# Patient Record
Sex: Male | Born: 1965 | Hispanic: Yes | Marital: Married | Smoking: Never smoker
Health system: Southern US, Community
[De-identification: ages and names within clinical notes are randomized; demographics above are authoritative.]

## PROBLEM LIST (undated history)

## (undated) DIAGNOSIS — E7800 Pure hypercholesterolemia, unspecified: Secondary | ICD-10-CM

## (undated) DIAGNOSIS — I10 Essential (primary) hypertension: Secondary | ICD-10-CM

## (undated) HISTORY — PX: HERNIA REPAIR: SHX51

---

## 2020-01-26 ENCOUNTER — Emergency Department (HOSPITAL_COMMUNITY)
Admission: EM | Admit: 2020-01-26 | Discharge: 2020-01-26 | Disposition: A | Discharge status: Home / Self Care | Payer: Self-pay | Attending: Emergency Medicine | Admitting: Emergency Medicine

## 2020-01-26 ENCOUNTER — Encounter (HOSPITAL_COMMUNITY): Payer: Self-pay

## 2020-01-26 ENCOUNTER — Emergency Department (HOSPITAL_COMMUNITY): Payer: Self-pay

## 2020-01-26 DIAGNOSIS — W19XXXA Unspecified fall, initial encounter: Secondary | ICD-10-CM

## 2020-01-26 DIAGNOSIS — M5441 Lumbago with sciatica, right side: Secondary | ICD-10-CM | POA: Insufficient documentation

## 2020-01-26 LAB — URINALYSIS, ROUTINE W REFLEX MICROSCOPIC
Bilirubin Urine: NEGATIVE
Glucose, UA: NEGATIVE mg/dL
Hgb urine dipstick: NEGATIVE
Ketones, ur: NEGATIVE mg/dL
Leukocytes,Ua: NEGATIVE
Nitrite: NEGATIVE
Protein, ur: NEGATIVE mg/dL
Specific Gravity, Urine: 1.01 (ref 1.005–1.030)
pH: 6 (ref 5.0–8.0)

## 2020-01-26 MED ORDER — LIDOCAINE 5 % EX PTCH: 1.0000 | MEDICATED_PATCH | CUTANEOUS | 0 refills | Status: DC

## 2020-01-26 MED ORDER — KETOROLAC TROMETHAMINE 30 MG/ML IJ SOLN
30.0000 mg | Freq: Once | INTRAMUSCULAR | Status: AC
  Administered 2020-01-26: 30 mg via INTRAMUSCULAR
  Filled 2020-01-26: qty 1

## 2020-01-26 MED ORDER — HYDROCODONE-ACETAMINOPHEN 5-325 MG PO TABS: 1.0000 | ORAL_TABLET | ORAL | 0 refills | Status: DC | PRN

## 2020-01-26 MED ORDER — PREDNISONE 50 MG PO TABS
50.0000 mg | ORAL_TABLET | Freq: Every day | ORAL | 0 refills | Status: AC
Start: 2020-01-26 — End: 2020-01-31

## 2020-01-26 NOTE — ED Provider Notes (Signed)
[facility] Provider Note   CSN: [id number] Arrival date & time: 01/26/20  0932    History Chief Complaint  Patient presents with  . Back Pain    Trevor Shelton is a 54 y.o. male with no significant past medical history who presents for evaluation after fall.  Patient states 2-3 weeks ago he was approximately 4 feet off the ground when he fell.  Patient states he hit the right aspect of his body.  Since then he has had right lateral chest pain, right lumbar pain.  Pain occasionally radiate into her right gluteus. Was given Flexeril and naproxen by urgent care.  Patient states he continues to have symptoms.  Pain worse with palpating the ribs.  He denies any his head, LOC or anticoagulation.  He denies any chest pain, shortness of breath, hemoptysis, lateral leg swelling, redness, warmth, headache, dizziness, abdominal pain, diarrhea, dysuria.  Denies additional aggravating or alleviating factors.  History obtained from patient and past medical records.  Patient declined medical spanish interpreter and preferred to use daughter.  HPI     History reviewed. No pertinent past medical history.  There are no problems to display for this patient.   History reviewed. No pertinent surgical history.     No family history on file.  Social History   Tobacco Use  . Smoking status: Not on file  Substance Use Topics  . Alcohol use: Not on file  . Drug use: Not on file    Home Medications Prior to Admission medications   Medication Sig Start Date End Date Taking? Authorizing Provider  HYDROcodone-acetaminophen (NORCO/VICODIN) 5-325 MG tablet Take 1 tablet by mouth every 4 (four) hours as needed. 01/26/20   Orlando Thalmann A, PA-C  lidocaine (LIDODERM) 5 % Place 1 patch onto the skin daily. Remove & Discard patch within 12 hours or as directed by MD 01/26/20   Ahman Dugdale A, PA-C  predniSONE (DELTASONE) 50 MG tablet Take 1 tablet (50 mg total) by  mouth daily for 5 days. 01/26/20 01/31/20  Derric Dealmeida A, PA-C    Allergies    Patient has no allergy information on record.  Review of Systems   Review of Systems  Constitutional: Negative.   HENT: Negative.   Respiratory: Negative.   Cardiovascular: Negative.   Gastrointestinal: Negative.   Genitourinary: Negative.   Musculoskeletal: Positive for back pain. Negative for arthralgias, gait problem, joint swelling, myalgias, neck pain and neck stiffness.  Skin: Negative.   Neurological: Negative.   All other systems reviewed and are negative.   Physical Exam Updated Vital Signs BP (!) 150/83 (BP Location: Left Arm)   Pulse 66   Temp 97.7 F (36.5 C) (Oral)   Resp 12   SpO2 100%   Physical Exam Physical Exam  Constitutional: Pt is oriented to person, place, and time. Appears well-developed and well-nourished. No distress.  HENT:  Head: Normocephalic and atraumatic.  Nose: Nose normal.  Mouth/Throat: Uvula is midline, oropharynx is clear and moist and mucous membranes are normal.  Eyes: Conjunctivae and EOM are normal. Pupils are equal, round, and reactive to light.  Neck: No spinous process tenderness and no muscular tenderness present. No rigidity. Normal range of motion present.  Full ROM without pain No midline cervical tenderness No crepitus, deformity or step-offs No paraspinal tenderness  Cardiovascular: Normal rate, regular rhythm and intact distal pulses.   Pulses:      Radial pulses are 2+ on the right side, and 2+ on  the left side.       Dorsalis pedis pulses are 2+ on the right side, and 2+ on the left side.       Posterior tibial pulses are 2+ on the right side, and 2+ on the left side.  Pulmonary/Chest: Effort normal and breath sounds normal. No accessory muscle usage. No respiratory distress. No decreased breath sounds. No wheezes. No rhonchi. No rales. Exhibits no tenderness and no bony tenderness.  No flail segment, crepitus or deformity Equal chest  expansion  Tenderness palpation to lateral, anterior lower right ribs. Abdominal: Soft. Normal appearance and bowel sounds are normal. There is no tenderness. There is no rigidity, no guarding and no CVA tenderness.  Abd soft and nontender  Musculoskeletal: Normal range of motion.       Thoracic back: Exhibits normal range of motion.       Lumbar back: Exhibits normal range of motion.  Full range of motion of the T-spine and L-spine No tenderness to palpation of the spinous processes of the T-spine or L-spine No crepitus, deformity or step-offs Moderate tenderness to palpation of the paraspinous muscles of the L-spine on the RIGHT more so over SI, piriformis. Positive straight leg raise on right at 45" Lymphadenopathy:    Pt has no cervical adenopathy.  Neurological: Pt is alert and oriented to person, place, and time. Normal reflexes. No cranial nerve deficit. GCS eye subscore is 4. GCS verbal subscore is 5. GCS motor subscore is 6.  Reflex Scores:      Bicep reflexes are 2+ on the right side and 2+ on the left side.      Brachioradialis reflexes are 2+ on the right side and 2+ on the left side.      Patellar reflexes are 2+ on the right side and 2+ on the left side.      Achilles reflexes are 2+ on the right side and 2+ on the left side. Speech is clear and goal oriented, follows commands Normal 5/5 strength in upper and lower extremities bilaterally including dorsiflexion and plantar flexion, strong and equal grip strength Sensation normal to light and sharp touch Moves extremities without ataxia, coordination intact Normal gait and balance No Clonus  Skin: Skin is warm and dry. No rash noted. Pt is not diaphoretic. No erythema.  Psychiatric: Normal mood and affect.  Nursing note and vitals reviewed. ED Results / Procedures / Treatments   Labs (all labs ordered are listed, but only abnormal results are displayed) Labs Reviewed  URINALYSIS, ROUTINE W REFLEX MICROSCOPIC     EKG None  Radiology DG Ribs Bilateral W/Chest  Result Date: 01/26/2020 CLINICAL DATA:  Right rib pain after fall at work. EXAM: BILATERAL RIBS AND CHEST - 4+ VIEW COMPARISON:  None. FINDINGS: No fracture or other bone lesions are seen involving the ribs. There is no evidence of pneumothorax or pleural effusion. Both lungs are clear. Heart size and mediastinal contours are within normal limits. IMPRESSION: Negative. Electronically Signed   By: Marijo Conception M.D.   On: 01/26/2020 13:20   DG Thoracic Spine 2 View  Result Date: 01/26/2020 CLINICAL DATA:  Back pain after fall at work. EXAM: THORACIC SPINE 2 VIEWS COMPARISON:  None. FINDINGS: There is no evidence of thoracic spine fracture. Alignment is normal. No other significant bone abnormalities are identified. IMPRESSION: Negative. Electronically Signed   By: Marijo Conception M.D.   On: 01/26/2020 13:21   DG Lumbar Spine Complete  Result Date: 01/26/2020 CLINICAL DATA:  Fall, back pain. Additional provided: Fall 3 weeks ago, sharp pain in posterior back on right side. EXAM: LUMBAR SPINE - COMPLETE 4+ VIEW COMPARISON:  No pertinent prior studies available for comparison. FINDINGS: For the purposes of this dictation, five lumbar vertebrae are assumed and the caudal most well-formed intervertebral disc is designated L5-S1. Rudimentary ribs at the T12 level. Mild L1-L2, L2-L3, L3-L4, L4-L5 grade 1 retrolisthesis. No lumbar compression deformity is demonstrated. Intervertebral disc height is maintained. Mild facet arthrosis greatest at L5-S1. Suspected fusion across the sacroiliac joints bilaterally IMPRESSION: No lumbar compression deformity is demonstrated. Mild L1-L2, L2-L3, L3-L4, L4-L5 grade 1 retrolisthesis. Mild lumbar spondylosis as outlined. Suspected fusion across the sacroiliac joints bilaterally. Electronically Signed   By: Kellie Simmering DO   On: 01/26/2020 10:15   Procedures Procedures (including critical care time)  Medications  Ordered in ED Medications  ketorolac (TORADOL) 30 MG/ML injection 30 mg (30 mg Intramuscular Given 01/26/20 1339)    ED Course  I have reviewed the triage vital signs and the nursing notes.  Pertinent labs & imaging results that were available during my care of the patient were reviewed by me and considered in my medical decision making (see chart for details).  54 year old presents for evaluation of right-sided lower back pain as well as some right rib pain after falling 2 weeks ago.  Afebrile, nonseptic, not ill-appearing. Able to reproduce pain. Tenderness to right lateral ribs however symptoms not consistent with ACS, PE, dissection. Low suspicion for acute traumatic injury given has been 3 weeks. No overlying ecchymosis to suggest internal bleed.  Heart and lung clear.  Abdomen soft, nontender.  Tenderness over right piriformis, SI.  Positive straight leg raise.  No history of IV drug use, bowel or bladder incontinence, saddle paresthesias.  He is ambulatory that difficulty.  Neurovascularly intact.  Pelvis stable, nontender palpation.  Plan on imaging and reassess  Imaging personally reviewed and interpreted.  No acute findings. Urinalysis negative for infection.  Patient reassessed.  He is ambulatory without difficulty.  Will do short course of pain medication, lidocaine patches, NSAIDs and have him follow-up with orthopedics.  Low suspicion for acute emergent pathology such as infection, hemorrhage, cauda equina, discitis, osteomyelitis, transverse myelitis, ACS, PE, dissection.  The patient has been appropriately medically screened and/or stabilized in the ED. I have low suspicion for any other emergent medical condition which would require further screening, evaluation or treatment in the ED or require inpatient management.  Patient is hemodynamically stable and in no acute distress.  Patient able to ambulate in department prior to ED.  Evaluation does not show acute pathology that would  require ongoing or additional emergent interventions while in the emergency department or further inpatient treatment.  I have discussed the diagnosis with the patient and answered all questions.  Pain is been managed while in the emergency department and patient has no further complaints prior to discharge.  Patient is comfortable with plan discussed in room and is stable for discharge at this time.  I have discussed strict return precautions for returning to the emergency department.  Patient was encouraged to follow-up with PCP/specialist refer to at discharge.    MDM Rules/Calculators/A&P                           Final Clinical Impression(s) / ED Diagnoses Final diagnoses:  Fall  Acute right-sided low back pain with right-sided sciatica    Rx / DC Orders ED  Discharge Orders         Ordered    HYDROcodone-acetaminophen (NORCO/VICODIN) 5-325 MG tablet  Every 4 hours PRN     Discontinue  Reprint     01/26/20 1406    lidocaine (LIDODERM) 5 %  Every 24 hours     Discontinue  Reprint     01/26/20 1406    predniSONE (DELTASONE) 50 MG tablet  Daily     Discontinue  Reprint     01/26/20 1406           Nicholas Trompeter A, PA-C 01/26/20 1410    Dorie Rank, MD 01/26/20 1536

## 2020-01-26 NOTE — ED Notes (Signed)
Patient Alert and oriented to baseline. Stable and ambulatory to baseline. Patient verbalized understanding of the discharge instructions.  Patient belongings were taken by the patient.   

## 2020-01-26 NOTE — ED Triage Notes (Signed)
Pt reports right sided lower back pain for the past 2 week, seen at [facility] and given muscle relaxer without relief. Pt reports a fall at work prior to the pain. Pt ambulatory

## 2020-01-26 NOTE — Discharge Instructions (Signed)
Follow up with Orthopedics  Return for new or worsening symptoms

## 2020-02-07 DIAGNOSIS — N179 Acute kidney failure, unspecified: Secondary | ICD-10-CM

## 2020-02-07 HISTORY — DX: Acute kidney failure, unspecified: N17.9

## 2020-02-07 HISTORY — DX: Hypercalcemia: E83.52

## 2020-02-14 ENCOUNTER — Encounter (HOSPITAL_COMMUNITY): Payer: Self-pay | Admitting: Emergency Medicine

## 2020-02-14 ENCOUNTER — Emergency Department (HOSPITAL_COMMUNITY): Payer: Self-pay

## 2020-02-14 ENCOUNTER — Inpatient Hospital Stay (HOSPITAL_COMMUNITY)
Admission: EM | Admit: 2020-02-14 | Discharge: 2020-03-03 | DRG: 840 | Disposition: A | Discharge status: Rehabilitation Facility | Payer: Self-pay | Attending: Internal Medicine | Admitting: Internal Medicine

## 2020-02-14 ENCOUNTER — Other Ambulatory Visit: Payer: Self-pay

## 2020-02-14 DIAGNOSIS — E785 Hyperlipidemia, unspecified: Secondary | ICD-10-CM | POA: Diagnosis present

## 2020-02-14 DIAGNOSIS — K219 Gastro-esophageal reflux disease without esophagitis: Secondary | ICD-10-CM | POA: Diagnosis present

## 2020-02-14 DIAGNOSIS — D631 Anemia in chronic kidney disease: Secondary | ICD-10-CM | POA: Diagnosis present

## 2020-02-14 DIAGNOSIS — G9529 Other cord compression: Secondary | ICD-10-CM | POA: Diagnosis present

## 2020-02-14 DIAGNOSIS — Z452 Encounter for adjustment and management of vascular access device: Secondary | ICD-10-CM

## 2020-02-14 DIAGNOSIS — E8809 Other disorders of plasma-protein metabolism, not elsewhere classified: Secondary | ICD-10-CM | POA: Diagnosis present

## 2020-02-14 DIAGNOSIS — R945 Abnormal results of liver function studies: Secondary | ICD-10-CM | POA: Diagnosis present

## 2020-02-14 DIAGNOSIS — S22079A Unspecified fracture of T9-T10 vertebra, initial encounter for closed fracture: Secondary | ICD-10-CM

## 2020-02-14 DIAGNOSIS — T380X5A Adverse effect of glucocorticoids and synthetic analogues, initial encounter: Secondary | ICD-10-CM | POA: Diagnosis not present

## 2020-02-14 DIAGNOSIS — S22059A Unspecified fracture of T5-T6 vertebra, initial encounter for closed fracture: Secondary | ICD-10-CM

## 2020-02-14 DIAGNOSIS — C9000 Multiple myeloma not having achieved remission: Principal | ICD-10-CM

## 2020-02-14 DIAGNOSIS — K5900 Constipation, unspecified: Secondary | ICD-10-CM | POA: Diagnosis present

## 2020-02-14 DIAGNOSIS — K1379 Other lesions of oral mucosa: Secondary | ICD-10-CM | POA: Diagnosis not present

## 2020-02-14 DIAGNOSIS — S2242XA Multiple fractures of ribs, left side, initial encounter for closed fracture: Secondary | ICD-10-CM

## 2020-02-14 DIAGNOSIS — W11XXXD Fall on and from ladder, subsequent encounter: Secondary | ICD-10-CM | POA: Diagnosis present

## 2020-02-14 DIAGNOSIS — Z6832 Body mass index (BMI) 32.0-32.9, adult: Secondary | ICD-10-CM

## 2020-02-14 DIAGNOSIS — Z20822 Contact with and (suspected) exposure to covid-19: Secondary | ICD-10-CM | POA: Diagnosis present

## 2020-02-14 DIAGNOSIS — E871 Hypo-osmolality and hyponatremia: Secondary | ICD-10-CM | POA: Diagnosis present

## 2020-02-14 DIAGNOSIS — E872 Acidosis: Secondary | ICD-10-CM | POA: Diagnosis not present

## 2020-02-14 DIAGNOSIS — E1165 Type 2 diabetes mellitus with hyperglycemia: Secondary | ICD-10-CM | POA: Diagnosis not present

## 2020-02-14 DIAGNOSIS — G9589 Other specified diseases of spinal cord: Secondary | ICD-10-CM | POA: Diagnosis present

## 2020-02-14 DIAGNOSIS — M8448XA Pathological fracture, other site, initial encounter for fracture: Secondary | ICD-10-CM | POA: Diagnosis present

## 2020-02-14 DIAGNOSIS — R042 Hemoptysis: Secondary | ICD-10-CM

## 2020-02-14 DIAGNOSIS — M8440XA Pathological fracture, unspecified site, initial encounter for fracture: Secondary | ICD-10-CM | POA: Diagnosis present

## 2020-02-14 DIAGNOSIS — E119 Type 2 diabetes mellitus without complications: Secondary | ICD-10-CM

## 2020-02-14 DIAGNOSIS — N179 Acute kidney failure, unspecified: Secondary | ICD-10-CM

## 2020-02-14 DIAGNOSIS — E669 Obesity, unspecified: Secondary | ICD-10-CM | POA: Diagnosis present

## 2020-02-14 DIAGNOSIS — N170 Acute kidney failure with tubular necrosis: Secondary | ICD-10-CM | POA: Diagnosis present

## 2020-02-14 DIAGNOSIS — Z79899 Other long term (current) drug therapy: Secondary | ICD-10-CM

## 2020-02-14 DIAGNOSIS — R748 Abnormal levels of other serum enzymes: Secondary | ICD-10-CM

## 2020-02-14 DIAGNOSIS — Z713 Dietary counseling and surveillance: Secondary | ICD-10-CM

## 2020-02-14 DIAGNOSIS — D492 Neoplasm of unspecified behavior of bone, soft tissue, and skin: Secondary | ICD-10-CM

## 2020-02-14 DIAGNOSIS — J9811 Atelectasis: Secondary | ICD-10-CM | POA: Diagnosis not present

## 2020-02-14 DIAGNOSIS — R197 Diarrhea, unspecified: Secondary | ICD-10-CM | POA: Diagnosis not present

## 2020-02-14 DIAGNOSIS — E875 Hyperkalemia: Secondary | ICD-10-CM | POA: Diagnosis present

## 2020-02-14 DIAGNOSIS — D72829 Elevated white blood cell count, unspecified: Secondary | ICD-10-CM | POA: Diagnosis present

## 2020-02-14 DIAGNOSIS — I10 Essential (primary) hypertension: Secondary | ICD-10-CM | POA: Diagnosis present

## 2020-02-14 HISTORY — DX: Pure hypercholesterolemia, unspecified: E78.00

## 2020-02-14 HISTORY — DX: Essential (primary) hypertension: I10

## 2020-02-14 LAB — URINALYSIS, ROUTINE W REFLEX MICROSCOPIC
Bilirubin Urine: NEGATIVE
Glucose, UA: NEGATIVE mg/dL
Hgb urine dipstick: NEGATIVE
Ketones, ur: NEGATIVE mg/dL
Leukocytes,Ua: NEGATIVE
Nitrite: NEGATIVE
Protein, ur: NEGATIVE mg/dL
Specific Gravity, Urine: 1.012 (ref 1.005–1.030)
pH: 6 (ref 5.0–8.0)

## 2020-02-14 LAB — CBC
HCT: 41.2 % (ref 39.0–52.0)
Hemoglobin: 14.2 g/dL (ref 13.0–17.0)
MCH: 29.3 pg (ref 26.0–34.0)
MCHC: 34.5 g/dL (ref 30.0–36.0)
MCV: 85.1 fL (ref 80.0–100.0)
Platelets: 212 10*3/uL (ref 150–400)
RBC: 4.84 MIL/uL (ref 4.22–5.81)
RDW: 11.2 % — ABNORMAL LOW (ref 11.5–15.5)
WBC: 13.3 10*3/uL — ABNORMAL HIGH (ref 4.0–10.5)
nRBC: 0 % (ref 0.0–0.2)

## 2020-02-14 LAB — COMPREHENSIVE METABOLIC PANEL
ALT: 42 U/L (ref 0–44)
AST: 34 U/L (ref 15–41)
Albumin: 3.3 g/dL — ABNORMAL LOW (ref 3.5–5.0)
Alkaline Phosphatase: 66 U/L (ref 38–126)
Anion gap: 14 (ref 5–15)
BUN: 84 mg/dL — ABNORMAL HIGH (ref 6–20)
CO2: 29 mmol/L (ref 22–32)
Calcium: >15 mg/dL (ref 8.9–10.3)
Chloride: 82 mmol/L — ABNORMAL LOW (ref 98–111)
Creatinine, Ser: 4.73 mg/dL — ABNORMAL HIGH (ref 0.61–1.24)
GFR calc Af Amer: 15 mL/min — ABNORMAL LOW (ref >60)
GFR calc non Af Amer: 13 mL/min — ABNORMAL LOW (ref >60)
Glucose, Bld: 158 mg/dL — ABNORMAL HIGH (ref 70–99)
Potassium: 4.4 mmol/L (ref 3.5–5.1)
Sodium: 125 mmol/L — ABNORMAL LOW (ref 135–145)
Total Bilirubin: 1.3 mg/dL — ABNORMAL HIGH (ref 0.3–1.2)
Total Protein: 6.8 g/dL (ref 6.5–8.1)

## 2020-02-14 LAB — LIPASE, BLOOD: Lipase: 28 U/L (ref 11–51)

## 2020-02-14 LAB — TSH: TSH: 0.508 u[IU]/mL (ref 0.350–4.500)

## 2020-02-14 LAB — SARS CORONAVIRUS 2 BY RT PCR (HOSPITAL ORDER, PERFORMED IN ~~LOC~~ HOSPITAL LAB): SARS Coronavirus 2: NEGATIVE

## 2020-02-14 MED ORDER — DOCUSATE SODIUM 100 MG PO CAPS
100.0000 mg | ORAL_CAPSULE | Freq: Every day | ORAL | Status: DC
  Administered 2020-02-15 – 2020-02-18 (×4): 100 mg via ORAL
  Filled 2020-02-14 (×5): qty 1

## 2020-02-14 MED ORDER — HEPARIN SODIUM (PORCINE) 5000 UNIT/ML IJ SOLN
5000.0000 [IU] | Freq: Three times a day (TID) | INTRAMUSCULAR | Status: DC
  Administered 2020-02-15 – 2020-02-17 (×7): 5000 [IU] via SUBCUTANEOUS
  Filled 2020-02-14 (×7): qty 1

## 2020-02-14 MED ORDER — SODIUM CHLORIDE 0.9 % IV SOLN: Freq: Once | INTRAVENOUS | Status: AC

## 2020-02-14 MED ORDER — ONDANSETRON HCL 4 MG PO TABS
4.0000 mg | ORAL_TABLET | Freq: Four times a day (QID) | ORAL | Status: DC | PRN
  Administered 2020-02-15: 4 mg via ORAL
  Filled 2020-02-14 (×2): qty 1

## 2020-02-14 MED ORDER — HYDROMORPHONE HCL 1 MG/ML IJ SOLN
0.5000 mg | INTRAMUSCULAR | Status: DC | PRN
  Administered 2020-02-14 – 2020-02-17 (×16): 1 mg via INTRAVENOUS
  Administered 2020-02-17: 0.5 mg via INTRAVENOUS
  Administered 2020-02-17 – 2020-03-02 (×36): 1 mg via INTRAVENOUS
  Filled 2020-02-14 (×53): qty 1

## 2020-02-14 MED ORDER — ACETAMINOPHEN 325 MG PO TABS
650.0000 mg | ORAL_TABLET | Freq: Four times a day (QID) | ORAL | Status: DC | PRN
  Administered 2020-02-22 – 2020-03-02 (×9): 650 mg via ORAL
  Filled 2020-02-14 (×9): qty 2

## 2020-02-14 MED ORDER — ONDANSETRON HCL 4 MG/2ML IJ SOLN
4.0000 mg | Freq: Four times a day (QID) | INTRAMUSCULAR | Status: DC | PRN
  Administered 2020-02-17: 4 mg via INTRAVENOUS
  Filled 2020-02-14 (×2): qty 2

## 2020-02-14 MED ORDER — ACETAMINOPHEN 650 MG RE SUPP: 650.0000 mg | Freq: Four times a day (QID) | RECTAL | Status: DC | PRN

## 2020-02-14 MED ORDER — SODIUM CHLORIDE 0.9% FLUSH: 3.0000 mL | Freq: Once | INTRAVENOUS | Status: DC

## 2020-02-14 MED ORDER — HYDROMORPHONE HCL 1 MG/ML IJ SOLN
0.5000 mg | Freq: Once | INTRAMUSCULAR | Status: AC
  Administered 2020-02-14: 0.5 mg via INTRAVENOUS
  Filled 2020-02-14: qty 1

## 2020-02-14 MED ORDER — SODIUM CHLORIDE 0.9 % BOLUS PEDS: 1000.0000 mL | Freq: Once | INTRAVENOUS | Status: DC

## 2020-02-14 MED ORDER — SODIUM CHLORIDE 0.9 % IV BOLUS
1000.0000 mL | Freq: Once | INTRAVENOUS | Status: AC
  Administered 2020-02-14: 1000 mL via INTRAVENOUS

## 2020-02-14 MED ORDER — POLYETHYLENE GLYCOL 3350 17 G PO PACK
17.0000 g | PACK | Freq: Every day | ORAL | Status: DC
  Administered 2020-02-15 – 2020-02-16 (×2): 17 g via ORAL
  Filled 2020-02-14 (×3): qty 1

## 2020-02-14 MED ORDER — OXYCODONE HCL 5 MG PO TABS
5.0000 mg | ORAL_TABLET | ORAL | Status: DC | PRN
  Administered 2020-02-15 – 2020-03-03 (×32): 5 mg via ORAL
  Filled 2020-02-14 (×33): qty 1

## 2020-02-14 MED ORDER — GADOBUTROL 1 MMOL/ML IV SOLN
6.0000 mL | Freq: Once | INTRAVENOUS | Status: AC | PRN
  Administered 2020-02-14: 6 mL via INTRAVENOUS

## 2020-02-14 MED ORDER — ENOXAPARIN SODIUM 40 MG/0.4ML ~~LOC~~ SOLN: 40.0000 mg | SUBCUTANEOUS | Status: DC

## 2020-02-14 MED ORDER — SODIUM CHLORIDE 0.9 % IV SOLN: INTRAVENOUS | Status: DC

## 2020-02-14 NOTE — ED Notes (Signed)
Pt resting at this time.

## 2020-02-14 NOTE — ED Triage Notes (Signed)
Pt fell 1 month ago.  Reports generalized weakness, generalized abd pain, lower back pain, vomiting, and diarrhea since the fall.

## 2020-02-14 NOTE — ED Provider Notes (Signed)
[facility] Provider Note   CSN: [id number] Arrival date & time: 02/14/20  1211     History Chief Complaint  Patient presents with  . Weakness  . Abdominal Pain    Trevor Shelton is a 54 y.o. male presents for evaluation of acute onset, progressively worsening chest pain and abdominal pain for 1 month, generalized weakness for 2 weeks.  He reports that in early July he fell off of a ladder from a height of around 4 feet, landed on his left side.  Denies head injury or loss of consciousness.  He was seen on 01/26/2020 for pain with negative radiographs.  2 weeks ago he went to see an orthopedist outpatient and states that he administered "2 injections to my buttocks" and prescribed him a medication but he does not know what it is.  He states it has not been helpful.  He notes daily 9/10 burning pain around his chest radiating to the back and into the abdomen.  He does feel worsening pain with deep inspiration and feels as though he cannot get a deep breath.  No nausea or vomiting.  Has been constipated and has noted decreased urine output and weak urine stream for the last 2 weeks or so.  He is also noted a feeling of weakness to the bilateral lower extremities for the last 2 weeks.  States that he feels unsteady with ambulation and has required additional assistance from family members at home.  Denies bowel or bladder incontinence.  With prolonged ambulation he notes cramping sensation in his lower extremities bilaterally.  He takes fish oil supplements for cholesterol and a blood pressure medication but is unsure of what it is.  The history is provided by the patient.       History reviewed. No pertinent past medical history.  Patient Active Problem List   Diagnosis Date Noted  . Hypercalcemia 02/14/2020  . AKI (acute kidney injury) ([place]) 02/14/2020  . Pathologic fracture 02/14/2020  . Hyponatremia 02/14/2020  . Leukocytosis 02/14/2020    History  reviewed. No pertinent surgical history.     History reviewed. No pertinent family history.  Social History   Tobacco Use  . Smoking status: Never Smoker  . Smokeless tobacco: Never Used  Substance Use Topics  . Alcohol use: Not Currently  . Drug use: Not Currently    Home Medications Prior to Admission medications   Medication Sig Start Date End Date Taking? Authorizing Provider  HYDROcodone-acetaminophen (NORCO/VICODIN) 5-325 MG tablet Take 1 tablet by mouth every 4 (four) hours as needed. 01/26/20   Henderly, Britni A, PA-C  lidocaine (LIDODERM) 5 % Place 1 patch onto the skin daily. Remove & Discard patch within 12 hours or as directed by MD 01/26/20   Henderly, Britni A, PA-C    Allergies    Patient has no allergy information on record.  Review of Systems   Review of Systems  Constitutional: Negative for chills and fever.  Respiratory: Positive for shortness of breath.   Cardiovascular: Positive for chest pain.  Gastrointestinal: Positive for abdominal pain and constipation. Negative for nausea and vomiting.  Genitourinary: Positive for decreased urine volume and difficulty urinating.  Musculoskeletal: Positive for back pain.  Neurological: Positive for weakness.  All other systems reviewed and are negative.   Physical Exam Updated Vital Signs BP 128/66 (BP Location: Left Arm)   Pulse 73   Temp 98.2 F (36.8 C) (Oral)   Resp 16   SpO2 96%  Physical Exam Vitals and nursing note reviewed.  Constitutional:      General: He is not in acute distress.    Appearance: He is well-developed.  HENT:     Head: Normocephalic and atraumatic.  Eyes:     General:        Right eye: No discharge.        Left eye: No discharge.     Conjunctiva/sclera: Conjunctivae normal.  Neck:     Vascular: No JVD.     Trachea: No tracheal deviation.  Cardiovascular:     Rate and Rhythm: Normal rate and regular rhythm.  Pulmonary:     Effort: Pulmonary effort is normal.     Breath  sounds: Normal breath sounds.  Chest:     Chest wall: Tenderness present.       Comments: No deformity, crepitus, ecchymosis or flail segment. Abdominal:     General: Abdomen is protuberant. Bowel sounds are increased. There is no distension.     Palpations: Abdomen is soft.     Tenderness: There is abdominal tenderness in the epigastric area. There is no right CVA tenderness, left CVA tenderness, guarding or rebound.  Musculoskeletal:     Comments: Midline thoracic spine tenderness along the levels of T6 and T9 with no deformity, crepitus, ecchymosis or flail segment.  Skin:    General: Skin is warm.     Findings: No erythema.  Neurological:     Mental Status: He is alert.     Comments: 5/5 strength of BUE and BLE major muscle groups.  Sensation intact to light touch of bilateral upper and lower extremities.  Psychiatric:        Behavior: Behavior normal.     ED Results / Procedures / Treatments   Labs (all labs ordered are listed, but only abnormal results are displayed) Labs Reviewed  CBC - Abnormal; Notable for the following components:      Result Value   WBC 13.3 (*)    RDW 11.2 (*)    All other components within normal limits  URINALYSIS, ROUTINE W REFLEX MICROSCOPIC - Abnormal; Notable for the following components:   Color, Urine STRAW (*)    All other components within normal limits  COMPREHENSIVE METABOLIC PANEL - Abnormal; Notable for the following components:   Sodium 125 (*)    Chloride 82 (*)    Glucose, Bld 158 (*)    BUN 84 (*)    Creatinine, Ser 4.73 (*)    Calcium >15.0 (*)    Albumin 3.3 (*)    Total Bilirubin 1.3 (*)    GFR calc non Af Amer 13 (*)    GFR calc Af Amer 15 (*)    All other components within normal limits  SARS CORONAVIRUS 2 BY RT PCR (HOSPITAL ORDER, [facility])  LIPASE, BLOOD  TSH  PTH, INTACT AND CALCIUM  HIV ANTIBODY (ROUTINE TESTING W REFLEX)  MAGNESIUM  PHOSPHORUS  COMPREHENSIVE METABOLIC PANEL    CBC  PROTEIN ELECTROPHORESIS, SERUM  UPEP/UIFE/LIGHT CHAINS/TP, 24-HR UR    EKG EKG Interpretation  Date/Time:  Sunday February 14 2020 14:36:51 EDT Ventricular Rate:  65 PR Interval:    QRS Duration: 101 QT Interval:  340 QTC Calculation: 354 R Axis:   65 Text Interpretation: Sinus rhythm Minimal ST elevation, anterolateral leads No STEMI Confirmed by Nanda Quinton [phone]) on 02/14/2020 2:43:31 PM   Radiology CT ABDOMEN PELVIS WO CONTRAST  Result Date: 02/14/2020 CLINICAL DATA:  Abdominal trauma. Trauma  1 month ago. Hypercalcemia. Fell 1 month ago. Abdominal pain, LOWER back pain, vomiting, diarrhea since the fall. EXAM: CT CHEST, ABDOMEN AND PELVIS WITHOUT CONTRAST TECHNIQUE: Multidetector CT imaging of the chest, abdomen and pelvis was performed following the standard protocol without IV contrast. COMPARISON:  None. FINDINGS: CT CHEST FINDINGS Cardiovascular: Heart is normal in size. Minimal atherosclerotic calcification of the coronary vessels. There is focal calcification of the ascending thoracic aorta, not associated with aneurysm. Normal noncontrast appearance of the pulmonary arteries. Mediastinum/Nodes: The visualized portion of the thyroid gland has a normal appearance. No significant mediastinal, hilar, or axillary adenopathy. Lungs/Pleura: There is subsegmental atelectasis at the lung bases. No suspicious pulmonary nodules. There is focal pleural thickening adjacent to LEFT anterior second rib lesion. Musculoskeletal: Diffuse lytic lesions are identified throughout the skeleton. Lesions vary in size from 2 millimeters to 1.8 centimeters. There is near complete replacement of the T5 vertebral body and loss of vertebral body height by approximately 30%. A soft tissue component extends to the RIGHT of the vertebral body and extends into the canal, impinging on the spinal cord, image 25 of series 3. Soft tissue mass in this region is 4.2 x 3.2 centimeters. At T9, there is near complete  replacement of the vertebral body with soft tissue component to the RIGHT of the vertebral body. Loss of vertebral body height is approximately 30%. Suspect impingement of the cord at this level. Image 38 of series 3. Large lytic lesion is identified within the T5 vertebral body, measuring 1.8 centimeters. This lesion extends to the RIGHT cortex 2. Acute fracture of LEFT ribs 9 and 10, possibly pathologic. There is a soft tissue component associated with lesions in the LEFT second rib. Soft tissue component measures 1.1 x 1.8 centimeters. CT ABDOMEN PELVIS FINDINGS Hepatobiliary: No focal liver abnormality is seen. No gallstones, gallbladder wall thickening, or biliary dilatation. Pancreas: Unremarkable. No pancreatic ductal dilatation or surrounding inflammatory changes. Spleen: Normal in size without focal abnormality. Adrenals/Urinary Tract: Adrenal glands are unremarkable. Kidneys are normal, without renal calculi, focal lesion, or hydronephrosis. Bladder is unremarkable. Stomach/Bowel: Stomach and small bowel loops are unremarkable. The appendix is well seen and has a normal appearance. Loops of colon are unremarkable. Average stool burden. Vascular/Lymphatic: No significant vascular findings are present. No enlarged abdominal or pelvic lymph nodes. Reproductive: Prostate is mildly enlarged. There are prostatic calcifications. Other: Fat containing RIGHT inguinal hernia.  No ascites. Musculoskeletal: Diffuse lytic lesions throughout the pelvis, proximal femurs, and lumbar spine. IMPRESSION: 1. Diffuse lytic lesions throughout the skeleton, consistent with multiple myeloma or metastatic disease. Multiple myeloma is favored. 2. Pathologic fractures at T5 and T9. These levels show significant soft tissue mass, and impingement upon the spinal cord. Recommend further evaluation with MRI of the thoracic spine with and without contrast. 3. Acute fractures of LEFT ribs 9 and 10, possibly pathologic. 4. No evidence for  acute injury of the abdomen or pelvis. 5. Fat containing RIGHT inguinal hernia. 6. Mildly enlarged prostate. 7. Aortic Atherosclerosis (ICD10-I70.0). These results were called by telephone at the time of interpretation on 02/14/2020 at 4:06 pm to provider [facility] , who verbally acknowledged these results. Electronically Signed   By: Nolon Nations M.D.   On: 02/14/2020 16:07   CT Chest Wo Contrast  Result Date: 02/14/2020 CLINICAL DATA:  Abdominal trauma. Trauma 1 month ago. Hypercalcemia. Fell 1 month ago. Abdominal pain, LOWER back pain, vomiting, diarrhea since the fall. EXAM: CT CHEST, ABDOMEN AND PELVIS WITHOUT CONTRAST TECHNIQUE: Multidetector  CT imaging of the chest, abdomen and pelvis was performed following the standard protocol without IV contrast. COMPARISON:  None. FINDINGS: CT CHEST FINDINGS Cardiovascular: Heart is normal in size. Minimal atherosclerotic calcification of the coronary vessels. There is focal calcification of the ascending thoracic aorta, not associated with aneurysm. Normal noncontrast appearance of the pulmonary arteries. Mediastinum/Nodes: The visualized portion of the thyroid gland has a normal appearance. No significant mediastinal, hilar, or axillary adenopathy. Lungs/Pleura: There is subsegmental atelectasis at the lung bases. No suspicious pulmonary nodules. There is focal pleural thickening adjacent to LEFT anterior second rib lesion. Musculoskeletal: Diffuse lytic lesions are identified throughout the skeleton. Lesions vary in size from 2 millimeters to 1.8 centimeters. There is near complete replacement of the T5 vertebral body and loss of vertebral body height by approximately 30%. A soft tissue component extends to the RIGHT of the vertebral body and extends into the canal, impinging on the spinal cord, image 25 of series 3. Soft tissue mass in this region is 4.2 x 3.2 centimeters. At T9, there is near complete replacement of the vertebral body with soft tissue  component to the RIGHT of the vertebral body. Loss of vertebral body height is approximately 30%. Suspect impingement of the cord at this level. Image 38 of series 3. Large lytic lesion is identified within the T5 vertebral body, measuring 1.8 centimeters. This lesion extends to the RIGHT cortex 2. Acute fracture of LEFT ribs 9 and 10, possibly pathologic. There is a soft tissue component associated with lesions in the LEFT second rib. Soft tissue component measures 1.1 x 1.8 centimeters. CT ABDOMEN PELVIS FINDINGS Hepatobiliary: No focal liver abnormality is seen. No gallstones, gallbladder wall thickening, or biliary dilatation. Pancreas: Unremarkable. No pancreatic ductal dilatation or surrounding inflammatory changes. Spleen: Normal in size without focal abnormality. Adrenals/Urinary Tract: Adrenal glands are unremarkable. Kidneys are normal, without renal calculi, focal lesion, or hydronephrosis. Bladder is unremarkable. Stomach/Bowel: Stomach and small bowel loops are unremarkable. The appendix is well seen and has a normal appearance. Loops of colon are unremarkable. Average stool burden. Vascular/Lymphatic: No significant vascular findings are present. No enlarged abdominal or pelvic lymph nodes. Reproductive: Prostate is mildly enlarged. There are prostatic calcifications. Other: Fat containing RIGHT inguinal hernia.  No ascites. Musculoskeletal: Diffuse lytic lesions throughout the pelvis, proximal femurs, and lumbar spine. IMPRESSION: 1. Diffuse lytic lesions throughout the skeleton, consistent with multiple myeloma or metastatic disease. Multiple myeloma is favored. 2. Pathologic fractures at T5 and T9. These levels show significant soft tissue mass, and impingement upon the spinal cord. Recommend further evaluation with MRI of the thoracic spine with and without contrast. 3. Acute fractures of LEFT ribs 9 and 10, possibly pathologic. 4. No evidence for acute injury of the abdomen or pelvis. 5. Fat  containing RIGHT inguinal hernia. 6. Mildly enlarged prostate. 7. Aortic Atherosclerosis (ICD10-I70.0). These results were called by telephone at the time of interpretation on 02/14/2020 at 4:06 pm to provider [facility] , who verbally acknowledged these results. Electronically Signed   By: Nolon Nations M.D.   On: 02/14/2020 16:07   MR THORACIC SPINE W WO CONTRAST  Result Date: 02/14/2020 CLINICAL DATA:  Back pain. Compression fracture. Fever with nausea and vomiting. EXAM: MRI THORACIC WITHOUT AND WITH CONTRAST TECHNIQUE: Multiplanar and multiecho pulse sequences of the thoracic spine were obtained without and with intravenous contrast. CONTRAST:  11m GADAVIST GADOBUTROL 1 MMOL/ML IV SOLN COMPARISON:  None. FINDINGS: MRI THORACIC SPINE FINDINGS Alignment:  Normal Vertebrae: There are numerous  bone marrow lesions throughout all visualized levels, but worst at T5, T6 and T9. There is compression deformity of T6 with 50% height loss and 5 mm of retropulsion. There is compression deformity of T9 with approximately 10% height loss and 3 mm of retropulsion. Cord: There is mild hyperintense T2-weighted signal in the spinal cord at T5. Paraspinal and other soft tissues: There is soft tissue mass is associated with the vertebral lesions at T6 and T9. Otherwise, the paraspinal soft tissues are unremarkable. Disc levels: T5-6: Contrast-enhancing mass replaces most of the vertebral body bone marrow and extends into the pedicles, transverse processes and the right paraspinal tissues. There is also epidural extension that severely narrows the thecal sac and causes mass effect on the spinal cord. The mass also severely narrows the right T5 neural foramen. T8-9: Soft tissue mass replacing much of the bone marrow at T9 with epidural extension mildly narrowing the thecal sac with slight mass effect on the spinal cord. The mass extends into the right neural foramen. IMPRESSION: 1. Numerous bone marrow lesions throughout all  visualized levels, but worst at T5, T6 and T9. 2. 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 The T6 lesion severely narrows the thecal sac and deforms the spinal cord. 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 3. Pathologic fractures of T6 and T9 with 3-5 mm of retropulsion. Electronically Signed   By: Ulyses Jarred M.D.   On: 02/14/2020 19:14    Procedures .Critical Care Performed by: Renita Papa, PA-C Authorized by: Renita Papa, PA-C   Critical care provider statement:    Critical care time (minutes):  45   Critical care was necessary to treat or prevent imminent or life-threatening deterioration of the following conditions:  Metabolic crisis and renal failure   Critical care was time spent personally by me on the following activities:  Discussions with consultants, evaluation of patient's response to treatment, examination of patient, ordering and performing treatments and interventions, ordering and review of laboratory studies, ordering and review of radiographic studies, pulse oximetry, re-evaluation of patient's condition, obtaining history from patient or surrogate and review of old charts   (including critical care time)  Medications Ordered in ED Medications  sodium chloride flush (NS) 0.9 % injection 3 mL (has no administration in time range)  0.9 %  sodium chloride infusion (has no administration in time range)  acetaminophen (TYLENOL) tablet 650 mg (has no administration in time range)    Or  acetaminophen (TYLENOL) suppository 650 mg (has no administration in time range)  oxyCODONE (Oxy IR/ROXICODONE) immediate release tablet 5 mg (has no administration in time range)  HYDROmorphone (DILAUDID) injection 0.5-1 mg (has no administration in time range)  ondansetron (ZOFRAN) tablet 4 mg (has no administration in time range)    Or  ondansetron (ZOFRAN) injection 4 mg (has no administration in time range)  heparin  injection 5,000 Units (has no administration in time range)  polyethylene glycol (MIRALAX / GLYCOLAX) packet 17 g (has no administration in time range)  docusate sodium (COLACE) capsule 100 mg (has no administration in time range)  0.9 %  sodium chloride infusion (0 mLs Intravenous Stopped 02/14/20 1936)  HYDROmorphone (DILAUDID) injection 0.5 mg (0.5 mg Intravenous Given 02/14/20 1618)  sodium chloride 0.9 % bolus 1,000 mL (1,000 mLs Intravenous New Bag/Given 02/14/20 1945)  gadobutrol (GADAVIST) 1 MMOL/ML injection 6 mL (6 mLs Intravenous Contrast Given 02/14/20 1836)    ED Course  I have reviewed the triage vital signs and the nursing notes.  Pertinent labs & imaging results that were available during my care of the patient were reviewed by me and considered in my medical decision making (see chart for details).   MDM Rules/Calculators/A&P                          Patient presenting for evaluation of progressive and worsening chest pain, thoracic back pain, difficulty ambulating.  He is afebrile, vital signs are stable.  He is nontoxic in appearance.  Pain is reproducible on palpation of the lower chest wall along the costal margin bilaterally as well as multiple areas involving the thoracic spine.  He did sustain a mechanical fall off of a ladder around 1 month ago and was seen in the ED a couple of weeks later with reassuring plain films.  However in the setting of persistent and progressively worsening symptoms, will obtain CT imaging for further evaluation and management.  Lab work reviewed and interpreted by myself concerning for AKI with creatinine of 4.73, BUN 84.  He tells me he has never had renal insufficiency previously but has never been told that he has any issues with his kidneys.  His sodium is low today as well at 125, calcium greater than 15.  Will administer IV fluids at a rate of 200 cc/h.  He has likely been hypercalcemic for the last 2 weeks or so as this is when many of his  symptoms have begun/worsened.  Noncontrast imaging was obtained in the setting of renal insufficiency.  This shows findings concerning for possible multiple myeloma with diffuse lytic lesions throughout the skeleton, pathologic fractures at T5 and T9 with soft tissue mass and impingement upon the spinal cord as well as acute fractures involving the left ninth and 10th ribs possibly pathologic.  6:04PM CONSULT: Spoke with Dr. Marcello Moores with neurosurgery.  From a neurosurgical standpoint in the setting of multiple myeloma, there are few surgical indications and management is typically radiation.  However he does advise that if the patient is a good candidate for kyphoplasty this could be explored.  He will follow along in consultation while the patient is admitted to the hospitalist service.  Radiology cleared the patient to undergo contrasted MRI of the thoracic spine. This shows numerous bone marrow lesions throughout all visualized levels but worse along T5, T6, T9.  There is epidural tumor extension at T6 and T9 and the T6 lesion severely narrows the thecal sac and deforms the spinal cord.  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  He has pathologic fractures at T6 and T9 with 3 to 5 mm of retropulsion.  On exam the patient has intact strength and sensation of the bilateral lower extremities but we did not ambulate him with concern for gait instability.  Spoke with Dr. Doristine Bosworth with [facility] hospitalist service who agrees to some care of patient and bring him into the hospital for further evaluation and management.  She requests oncology consultation to determine whether or not the patient needs to be transferred to [facility].  6:24 PM CONSULT: Spoke with Dr. Alen Blew with on-call oncology service.  He advises that as the patient's diagnosis of multiple myeloma is not confirmed he does not feel the patient requires transfer to [facility] at this time is the likely will not initiate  oncologic intervention emergently.  He recommends obtaining SPEP and UPEP to confirm diagnosis of multiple myeloma and  at that time oncology can be consulted for further management.  Final Clinical Impression(s) / ED Diagnoses Final diagnoses:  AKI (acute kidney injury) ([place])  Hypercalcemia  Closed fracture of sixth thoracic vertebra, unspecified fracture morphology, initial encounter ([place])  Closed fracture of ninth thoracic vertebra, unspecified fracture morphology, initial encounter [facility])  Thoracic spine tumor  Closed fracture of multiple ribs of left side, initial encounter    Rx / DC Orders ED Discharge Orders    None       Debroah Baller 02/14/20 2039    Virgel Manifold, MD 02/15/20 0007

## 2020-02-14 NOTE — ED Notes (Signed)
Pt to CT at this time.  Son remains in room

## 2020-02-14 NOTE — H&P (Signed)
History and Physical    Sage Kopera DJS:[id number] DOB: 05/01/1966 DOA: 02/14/2020  PCP: Patient, No Pcp Per  Patient coming from: Home  I have personally briefly reviewed patient's old medical records in [place]  Chief Complaint: Generalized weakness, left rib pain, back pain since 2 weeks.  HPI: Trevor Shelton is a 54 y.o. male with no significant past medical history presents to emergency department with generalized weakness, left rib pain, lower back pain, constipation since 1 month.  Patient does not speak [place].  Patient's son at the bedside at the historian.  He tells me that patient fell off of a ladder from a height of around 4 feet 1 month ago and landed on his left side and since then he started having back pain, left rib pain, and weakness in bilateral lower extremities which is getting worse.  He came to ER on 7/20.  Imaging and UA came back negative for acute findings therefore patient discharged home on prednisone and Norco.  He was also evaluated by orthopedic outpatient and was administered 2 injections and prescribed a medication? with no help.  Reports severe pain in his back with burning sensation, left-sided rib pain, aggravates with deep inspiration.  Reports constipation and has noted decreased urinary output as well.  Reports progressive lower extremity weakness however denies fever, chills, urinary or bowel incontinence.  No history of headache, blurry vision, lightheadedness, dizziness, loss of consciousness, syncope, chest pain, shortness of breath, palpitation, leg swelling, dysuria, hematuria, sleep or weight changes.  No history of smoking, alcohol, listed drug use.   ED Course: Upon arrival to ED: Patient's vital signs are stable, CBC shows WBC of 13.3, UA negative for infection, CMP shows sodium of 125, severe AKI, calcium more than 15, lipase, TSH: WNL, COVID-19 and PTH: Pending.  CT abdomen pelvis shows diffuse lytic lesions throughout the skeleton  consistent with multiple myeloma or metastatic disease.  Pathologic fractures at T5 and T9, acute fracture of left rib 9 and 10.  He received IV fluid and Dilaudid in ED.  EDP consulted neurosurgery and oncology.  Oncology recommended to check SPEP and UPEP.  [facility] hospitalist consulted for admission for hypercalcemia/AKI?  Multiple myeloma.  Review of Systems: As per HPI otherwise negative.    History reviewed. No pertinent past medical history.  History reviewed. No pertinent surgical history.   reports that he has never smoked. He has never used smokeless tobacco. He reports previous alcohol use. He reports previous drug use.  Not on File  History reviewed. No pertinent family history.  Prior to Admission medications   Medication Sig Start Date End Date Taking? Authorizing Provider  HYDROcodone-acetaminophen (NORCO/VICODIN) 5-325 MG tablet Take 1 tablet by mouth every 4 (four) hours as needed. 01/26/20   Henderly, Britni A, PA-C  lidocaine (LIDODERM) 5 % Place 1 patch onto the skin daily. Remove & Discard patch within 12 hours or as directed by MD 01/26/20   Henderly, Britni A, PA-C    Physical Exam: Vitals:   02/14/20 1221  BP: 128/66  Pulse: 73  Resp: 16  Temp: 98.2 F (36.8 C)  TempSrc: Oral  SpO2: 96%    Constitutional: NAD, calm, comfortable, on room air Eyes: PERRL, lids and conjunctivae normal ENMT: Mucous membranes are moist. Posterior pharynx clear of any exudate or lesions.Normal dentition.  Neck: normal, supple, no masses, no thyromegaly Respiratory: clear to auscultation bilaterally, no wheezing, no crackles. Normal respiratory effort. No accessory muscle use.  Cardiovascular: Regular rate and rhythm,  no murmurs / rubs / gallops. No extremity edema. 2+ pedal pulses. No carotid bruits.  Abdomen: no tenderness, no masses palpated. No hepatosplenomegaly. Bowel sounds positive.  Musculoskeletal: no clubbing / cyanosis. No joint deformity upper and lower extremities.  Good ROM, no contractures. Normal muscle tone.  Skin: no rashes, lesions, ulcers. No induration Neurologic: CN 2-12 grossly intact. Sensation intact, DTR normal. Strength 5/5 in all 4.  Psychiatric: Normal judgment and insight. Alert and oriented x 3. Normal mood.    Labs on Admission: I have personally reviewed following labs and imaging studies  CBC: Recent Labs  Lab 02/14/20 1252  WBC 13.3*  HGB 14.2  HCT 41.2  MCV 85.1  PLT 076   Basic Metabolic Panel: Recent Labs  Lab 02/14/20 1252  NA 125*  K 4.4  CL 82*  CO2 29  GLUCOSE 158*  BUN 84*  CREATININE 4.73*  CALCIUM >15.0*   GFR: CrCl cannot be calculated (Unknown ideal weight.). Liver Function Tests: Recent Labs  Lab 02/14/20 1252  AST 34  ALT 42  ALKPHOS 66  BILITOT 1.3*  PROT 6.8  ALBUMIN 3.3*   Recent Labs  Lab 02/14/20 1252  LIPASE 28   No results for input(s): AMMONIA in the last 168 hours. Coagulation Profile: No results for input(s): INR, PROTIME in the last 168 hours. Cardiac Enzymes: No results for input(s): CKTOTAL, CKMB, CKMBINDEX, TROPONINI in the last 168 hours. BNP (last 3 results) No results for input(s): PROBNP in the last 8760 hours. HbA1C: No results for input(s): HGBA1C in the last 72 hours. CBG: No results for input(s): GLUCAP in the last 168 hours. Lipid Profile: No results for input(s): CHOL, HDL, LDLCALC, TRIG, CHOLHDL, LDLDIRECT in the last 72 hours. Thyroid Function Tests: Recent Labs    02/14/20 1602  TSH 0.508   Anemia Panel: No results for input(s): VITAMINB12, FOLATE, FERRITIN, TIBC, IRON, RETICCTPCT in the last 72 hours. Urine analysis:    Component Value Date/Time   COLORURINE STRAW (A) 02/14/2020 1252   APPEARANCEUR CLEAR 02/14/2020 1252   LABSPEC 1.012 02/14/2020 1252   PHURINE 6.0 02/14/2020 1252   GLUCOSEU NEGATIVE 02/14/2020 1252   HGBUR NEGATIVE 02/14/2020 1252   BILIRUBINUR NEGATIVE 02/14/2020 1252   KETONESUR NEGATIVE 02/14/2020 1252   PROTEINUR  NEGATIVE 02/14/2020 1252   NITRITE NEGATIVE 02/14/2020 [place] 02/14/2020 1252    Radiological Exams on Admission: CT ABDOMEN PELVIS WO CONTRAST  Result Date: 02/14/2020 CLINICAL DATA:  Abdominal trauma. Trauma 1 month ago. Hypercalcemia. Fell 1 month ago. Abdominal pain, LOWER back pain, vomiting, diarrhea since the fall. EXAM: CT CHEST, ABDOMEN AND PELVIS WITHOUT CONTRAST TECHNIQUE: Multidetector CT imaging of the chest, abdomen and pelvis was performed following the standard protocol without IV contrast. COMPARISON:  None. FINDINGS: CT CHEST FINDINGS Cardiovascular: Heart is normal in size. Minimal atherosclerotic calcification of the coronary vessels. There is focal calcification of the ascending thoracic aorta, not associated with aneurysm. Normal noncontrast appearance of the pulmonary arteries. Mediastinum/Nodes: The visualized portion of the thyroid gland has a normal appearance. No significant mediastinal, hilar, or axillary adenopathy. Lungs/Pleura: There is subsegmental atelectasis at the lung bases. No suspicious pulmonary nodules. There is focal pleural thickening adjacent to LEFT anterior second rib lesion. Musculoskeletal: Diffuse lytic lesions are identified throughout the skeleton. Lesions vary in size from 2 millimeters to 1.8 centimeters. There is near complete replacement of the T5 vertebral body and loss of vertebral body height by approximately 30%. A soft tissue component extends to  the RIGHT of the vertebral body and extends into the canal, impinging on the spinal cord, image 25 of series 3. Soft tissue mass in this region is 4.2 x 3.2 centimeters. At T9, there is near complete replacement of the vertebral body with soft tissue component to the RIGHT of the vertebral body. Loss of vertebral body height is approximately 30%. Suspect impingement of the cord at this level. Image 38 of series 3. Large lytic lesion is identified within the T5 vertebral body, measuring  1.8 centimeters. This lesion extends to the RIGHT cortex 2. Acute fracture of LEFT ribs 9 and 10, possibly pathologic. There is a soft tissue component associated with lesions in the LEFT second rib. Soft tissue component measures 1.1 x 1.8 centimeters. CT ABDOMEN PELVIS FINDINGS Hepatobiliary: No focal liver abnormality is seen. No gallstones, gallbladder wall thickening, or biliary dilatation. Pancreas: Unremarkable. No pancreatic ductal dilatation or surrounding inflammatory changes. Spleen: Normal in size without focal abnormality. Adrenals/Urinary Tract: Adrenal glands are unremarkable. Kidneys are normal, without renal calculi, focal lesion, or hydronephrosis. Bladder is unremarkable. Stomach/Bowel: Stomach and small bowel loops are unremarkable. The appendix is well seen and has a normal appearance. Loops of colon are unremarkable. Average stool burden. Vascular/Lymphatic: No significant vascular findings are present. No enlarged abdominal or pelvic lymph nodes. Reproductive: Prostate is mildly enlarged. There are prostatic calcifications. Other: Fat containing RIGHT inguinal hernia.  No ascites. Musculoskeletal: Diffuse lytic lesions throughout the pelvis, proximal femurs, and lumbar spine. IMPRESSION: 1. Diffuse lytic lesions throughout the skeleton, consistent with multiple myeloma or metastatic disease. Multiple myeloma is favored. 2. Pathologic fractures at T5 and T9. These levels show significant soft tissue mass, and impingement upon the spinal cord. Recommend further evaluation with MRI of the thoracic spine with and without contrast. 3. Acute fractures of LEFT ribs 9 and 10, possibly pathologic. 4. No evidence for acute injury of the abdomen or pelvis. 5. Fat containing RIGHT inguinal hernia. 6. Mildly enlarged prostate. 7. Aortic Atherosclerosis (ICD10-I70.0). These results were called by telephone at the time of interpretation on 02/14/2020 at 4:06 pm to provider [facility] , who verbally  acknowledged these results. Electronically Signed   By: Nolon Nations M.D.   On: 02/14/2020 16:07   CT Chest Wo Contrast  Result Date: 02/14/2020 CLINICAL DATA:  Abdominal trauma. Trauma 1 month ago. Hypercalcemia. Fell 1 month ago. Abdominal pain, LOWER back pain, vomiting, diarrhea since the fall. EXAM: CT CHEST, ABDOMEN AND PELVIS WITHOUT CONTRAST TECHNIQUE: Multidetector CT imaging of the chest, abdomen and pelvis was performed following the standard protocol without IV contrast. COMPARISON:  None. FINDINGS: CT CHEST FINDINGS Cardiovascular: Heart is normal in size. Minimal atherosclerotic calcification of the coronary vessels. There is focal calcification of the ascending thoracic aorta, not associated with aneurysm. Normal noncontrast appearance of the pulmonary arteries. Mediastinum/Nodes: The visualized portion of the thyroid gland has a normal appearance. No significant mediastinal, hilar, or axillary adenopathy. Lungs/Pleura: There is subsegmental atelectasis at the lung bases. No suspicious pulmonary nodules. There is focal pleural thickening adjacent to LEFT anterior second rib lesion. Musculoskeletal: Diffuse lytic lesions are identified throughout the skeleton. Lesions vary in size from 2 millimeters to 1.8 centimeters. There is near complete replacement of the T5 vertebral body and loss of vertebral body height by approximately 30%. A soft tissue component extends to the RIGHT of the vertebral body and extends into the canal, impinging on the spinal cord, image 25 of series 3. Soft tissue mass in this region is  4.2 x 3.2 centimeters. At T9, there is near complete replacement of the vertebral body with soft tissue component to the RIGHT of the vertebral body. Loss of vertebral body height is approximately 30%. Suspect impingement of the cord at this level. Image 38 of series 3. Large lytic lesion is identified within the T5 vertebral body, measuring 1.8 centimeters. This lesion extends to the  RIGHT cortex 2. Acute fracture of LEFT ribs 9 and 10, possibly pathologic. There is a soft tissue component associated with lesions in the LEFT second rib. Soft tissue component measures 1.1 x 1.8 centimeters. CT ABDOMEN PELVIS FINDINGS Hepatobiliary: No focal liver abnormality is seen. No gallstones, gallbladder wall thickening, or biliary dilatation. Pancreas: Unremarkable. No pancreatic ductal dilatation or surrounding inflammatory changes. Spleen: Normal in size without focal abnormality. Adrenals/Urinary Tract: Adrenal glands are unremarkable. Kidneys are normal, without renal calculi, focal lesion, or hydronephrosis. Bladder is unremarkable. Stomach/Bowel: Stomach and small bowel loops are unremarkable. The appendix is well seen and has a normal appearance. Loops of colon are unremarkable. Average stool burden. Vascular/Lymphatic: No significant vascular findings are present. No enlarged abdominal or pelvic lymph nodes. Reproductive: Prostate is mildly enlarged. There are prostatic calcifications. Other: Fat containing RIGHT inguinal hernia.  No ascites. Musculoskeletal: Diffuse lytic lesions throughout the pelvis, proximal femurs, and lumbar spine. IMPRESSION: 1. Diffuse lytic lesions throughout the skeleton, consistent with multiple myeloma or metastatic disease. Multiple myeloma is favored. 2. Pathologic fractures at T5 and T9. These levels show significant soft tissue mass, and impingement upon the spinal cord. Recommend further evaluation with MRI of the thoracic spine with and without contrast. 3. Acute fractures of LEFT ribs 9 and 10, possibly pathologic. 4. No evidence for acute injury of the abdomen or pelvis. 5. Fat containing RIGHT inguinal hernia. 6. Mildly enlarged prostate. 7. Aortic Atherosclerosis (ICD10-I70.0). These results were called by telephone at the time of interpretation on 02/14/2020 at 4:06 pm to provider [facility] , who verbally acknowledged these results. Electronically Signed   By:  Nolon Nations M.D.   On: 02/14/2020 16:07   MR THORACIC SPINE W WO CONTRAST  Result Date: 02/14/2020 CLINICAL DATA:  Back pain. Compression fracture. Fever with nausea and vomiting. EXAM: MRI THORACIC WITHOUT AND WITH CONTRAST TECHNIQUE: Multiplanar and multiecho pulse sequences of the thoracic spine were obtained without and with intravenous contrast. CONTRAST:  55m GADAVIST GADOBUTROL 1 MMOL/ML IV SOLN COMPARISON:  None. FINDINGS: MRI THORACIC SPINE FINDINGS Alignment:  Normal Vertebrae: There are numerous bone marrow lesions throughout all visualized levels, but worst at T5, T6 and T9. There is compression deformity of T6 with 50% height loss and 5 mm of retropulsion. There is compression deformity of T9 with approximately 10% height loss and 3 mm of retropulsion. Cord: There is mild hyperintense T2-weighted signal in the spinal cord at T5. Paraspinal and other soft tissues: There is soft tissue mass is associated with the vertebral lesions at T6 and T9. Otherwise, the paraspinal soft tissues are unremarkable. Disc levels: T5-6: Contrast-enhancing mass replaces most of the vertebral body bone marrow and extends into the pedicles, transverse processes and the right paraspinal tissues. There is also epidural extension that severely narrows the thecal sac and causes mass effect on the spinal cord. The mass also severely narrows the right T5 neural foramen. T8-9: Soft tissue mass replacing much of the bone marrow at T9 with epidural extension mildly narrowing the thecal sac with slight mass effect on the spinal cord. The mass extends into the right  neural foramen. IMPRESSION: 1. Numerous bone marrow lesions throughout all visualized levels, but worst at T5, T6 and T9. 2. 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 The T6 lesion severely narrows the thecal sac and deforms the spinal cord. 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 3. Pathologic  fractures of T6 and T9 with 3-5 mm of retropulsion. Electronically Signed   By: Ulyses Jarred M.D.   On: 02/14/2020 19:14    EKG: Independently reviewed.  Sinus rhythm.  No ST elevation or depression noted.  Assessment/Plan Principal Problem:   Hypercalcemia Active Problems:   AKI (acute kidney injury) ([place])   Pathologic fracture   Hyponatremia   Leukocytosis   Hypercalcemia with T6, T9 pathological fracture and severe AKI: -Likely secondary to multiple myeloma -Reviewed CT chest, abdomen/pelvis result.  MRI thoracic spine is pending.  Patient received IV fluid and Dilaudid in the ED. -Admit patient on the floor.  TSH: WNL, COVID-19 and PTH: Pending. -EDP consulted neurosurgery and oncology -Oncology Dr. Alen Blew- recommended to check UPEP and SPEP-to confirm multiple myeloma and oncology will get involved at that point.  He does not think that patient needs to be transferred to [facility] -Tylenol/oxycodone/Dilaudid as needed for pain control -Zofran as needed for nausea and vomiting -Cont. IVF.  Monitor kidney function closely.  Monitor vitals.  AKI: Likely in the setting of underlying multiple myeloma -Continue IV fluids.  Avoid nephrotoxic medications -Repeat BMP tomorrow a.m.  Hyponatremia: Sodium of 125. -Pt is asymptomatic. continue IV fluids -Repeat CMP tomorrow a.m.  Leukocytosis: Likely reactive -Patient is afebrile.  Repeat CBC tomorrow a.m.  Constipation: Likely secondary to hypercalcemia due to underlying multiple myeloma -MiraLAX and Colace as needed for constipation -Continue IV fluids.  DVT prophylaxis: Heparin [place]/SCD Code Status: Full code Family Communication: Patient's son present at bedside.  Plan of care discussed with patient in length and he verbalized understanding and agreed with it. Disposition Plan: To be determined Consults called: Neurosurgery and oncology Admission status: Inpatient  Mckinley Jewel MD [facility]  If 7PM-7AM, please contact  night-coverage [email or web address] Password [facility]  02/14/2020, 7:38 PM

## 2020-02-14 NOTE — ED Notes (Signed)
Attempted report 

## 2020-02-14 NOTE — Progress Notes (Signed)
Pt arrived to 6n08 via stretcher from ED.  Son at bedside( only stayed to translate questions I had). Pt c/o back pain 10/10. Will continue to monitor.

## 2020-02-14 NOTE — ED Notes (Addendum)
Per lab, Calcium >15.  Dr. Jeanell Sparrow and Charge RN notified.

## 2020-02-14 NOTE — ED Notes (Signed)
Pt to MRI  St's pain is a #4 on pain scale 0/10

## 2020-02-15 LAB — PTH, INTACT AND CALCIUM
Calcium, Total (PTH): 15.8 mg/dL (ref 8.7–10.2)
PTH: 11 pg/mL — ABNORMAL LOW (ref 15–65)

## 2020-02-15 LAB — COMPREHENSIVE METABOLIC PANEL
ALT: 41 U/L (ref 0–44)
AST: 28 U/L (ref 15–41)
Albumin: 2.9 g/dL — ABNORMAL LOW (ref 3.5–5.0)
Alkaline Phosphatase: 58 U/L (ref 38–126)
Anion gap: 9 (ref 5–15)
BUN: 81 mg/dL — ABNORMAL HIGH (ref 6–20)
CO2: 32 mmol/L (ref 22–32)
Calcium: >15 mg/dL (ref 8.9–10.3)
Chloride: 89 mmol/L — ABNORMAL LOW (ref 98–111)
Creatinine, Ser: 4.92 mg/dL — ABNORMAL HIGH (ref 0.61–1.24)
GFR calc Af Amer: 14 mL/min — ABNORMAL LOW (ref >60)
GFR calc non Af Amer: 12 mL/min — ABNORMAL LOW (ref >60)
Glucose, Bld: 116 mg/dL — ABNORMAL HIGH (ref 70–99)
Potassium: 4.9 mmol/L (ref 3.5–5.1)
Sodium: 130 mmol/L — ABNORMAL LOW (ref 135–145)
Total Bilirubin: 0.7 mg/dL (ref 0.3–1.2)
Total Protein: 6.2 g/dL — ABNORMAL LOW (ref 6.5–8.1)

## 2020-02-15 LAB — PROTIME-INR
INR: 1.1 (ref 0.8–1.2)
Prothrombin Time: 13.7 seconds (ref 11.4–15.2)

## 2020-02-15 LAB — CBC
HCT: 37 % — ABNORMAL LOW (ref 39.0–52.0)
Hemoglobin: 12.8 g/dL — ABNORMAL LOW (ref 13.0–17.0)
MCH: 29 pg (ref 26.0–34.0)
MCHC: 34.6 g/dL (ref 30.0–36.0)
MCV: 83.7 fL (ref 80.0–100.0)
Platelets: 174 10*3/uL (ref 150–400)
RBC: 4.42 MIL/uL (ref 4.22–5.81)
RDW: 11.1 % — ABNORMAL LOW (ref 11.5–15.5)
WBC: 11.6 10*3/uL — ABNORMAL HIGH (ref 4.0–10.5)
nRBC: 0 % (ref 0.0–0.2)

## 2020-02-15 LAB — HIV ANTIBODY (ROUTINE TESTING W REFLEX): HIV Screen 4th Generation wRfx: NONREACTIVE

## 2020-02-15 LAB — APTT: aPTT: 20 seconds — ABNORMAL LOW (ref 24–36)

## 2020-02-15 LAB — TSH: TSH: 0.405 u[IU]/mL (ref 0.350–4.500)

## 2020-02-15 LAB — OSMOLALITY: Osmolality: 305 mOsm/kg — ABNORMAL HIGH (ref 275–295)

## 2020-02-15 LAB — PHOSPHORUS: Phosphorus: 6.3 mg/dL — ABNORMAL HIGH (ref 2.5–4.6)

## 2020-02-15 LAB — MAGNESIUM: Magnesium: 1.8 mg/dL (ref 1.7–2.4)

## 2020-02-15 MED ORDER — CALCITONIN (SALMON) 200 UNIT/ML IJ SOLN
4.0000 [IU]/kg | Freq: Two times a day (BID) | INTRAMUSCULAR | Status: AC
  Administered 2020-02-15 – 2020-02-16 (×4): 290 [IU] via SUBCUTANEOUS
  Filled 2020-02-15 (×4): qty 1.45

## 2020-02-15 MED ORDER — ZOLEDRONIC ACID 4 MG/5ML IV CONC: 4.0000 mg | Freq: Once | INTRAVENOUS | Status: DC

## 2020-02-15 MED ORDER — LIDOCAINE 5 % EX PTCH
1.0000 | MEDICATED_PATCH | CUTANEOUS | Status: DC
  Administered 2020-02-15 – 2020-03-02 (×14): 1 via TRANSDERMAL
  Filled 2020-02-15 (×16): qty 1

## 2020-02-15 NOTE — Evaluation (Signed)
Physical Therapy Evaluation Patient Details Name: Trevor Shelton MRN: [id number] DOB: 23-Dec-1965 Today's Date: 02/15/2020   History of Present Illness  Patient is a 54 y/o male who presents with generalized weakness, rib pain, back pain. Admitted with hypercalcemia and AKI. Abdominal CT- lytic lesions throughout skeleton consistent with multiple myeloma or metastatic disease. Pathological fxs T5, T9 and acute rib fxs left 9-10th. No PMH.  Clinical Impression  Patient presents with generalized weakness, pain and impaired mobility s/p above. Pt lives at home with wife and children and was working as a Tree surgeon. Recently, pt reports worsening weakness in LEs impacting mobility. Today, pt requires Mod A for bed mobility and Min guard assist for standing with use of RW. Noted to have marked weakness in BLEs locking out knees to prevent buckling. Able to side step along side the bed with Min A and use of RW. Would benefit from CIR to maximize independence and mobility prior to return home. Wife will be able to provide support. Will follow acutely.    Follow Up Recommendations CIR;Supervision for mobility/OOB;Supervision/Assistance - 24 hour    Equipment Recommendations  Rolling walker with 5" wheels;3in1 (PT)    Recommendations for Other Services Rehab consult     Precautions / Restrictions Precautions Precautions: Fall Precaution Comments: marked weakness BLEs Restrictions Weight Bearing Restrictions: No      Mobility  Bed Mobility Overal bed mobility: Needs Assistance Bed Mobility: Rolling;Sidelying to Sit;Sit to Sidelying Rolling: Mod assist Sidelying to sit: Mod assist;HOB elevated     Sit to sidelying: Min assist General bed mobility comments: Cues for log roll technique and step by step to reach for rail, assist with trunk and LEs to get to EOB.  Transfers Overall transfer level: Needs assistance Equipment used: Rolling walker (2 wheeled) Transfers: Sit to/from Stand Sit to  Stand: Min guard         General transfer comment: Min guard for safety. Stood from EOB x2 pulling up on RW and locking off BLEs to prevent buckling.  Ambulation/Gait Ambulation/Gait assistance: Min assist Gait Distance (Feet): 6 Feet Assistive device: Rolling walker (2 wheeled) Gait Pattern/deviations: Wide base of support Gait velocity: decreased   General Gait Details: Able to side step along side bed with cues for sequencing and assist for RW management. Bil knee instability/shaking noted.  Stairs            Wheelchair Mobility    Modified Rankin (Stroke Patients Only)       Balance Overall balance assessment: Needs assistance Sitting-balance support: Feet supported;No upper extremity supported Sitting balance-Leahy Scale: Good     Standing balance support: During functional activity Standing balance-Leahy Scale: Poor Standing balance comment: Requires UE support and external support for dynamic balance.                             Pertinent Vitals/Pain Pain Assessment: 0-10 Pain Score: 8  Pain Location: back, ribs Pain Descriptors / Indicators: Sore Pain Intervention(s): Monitored during session;Repositioned    Home Living Family/patient expects to be discharged to:: Private residence Living Arrangements: Spouse/significant other;Children Available Help at Discharge: Family;Available PRN/intermittently Type of Home: House Home Access: Stairs to enter Entrance Stairs-Rails: Right Entrance Stairs-Number of Steps: 5 Home Layout: One level Home Equipment: None      Prior Function Level of Independence: Independent         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 Reports increasing difficulty walking at home, heaviness and weakness in legs but worse in hospital.     Hand Dominance   Dominant Hand: Right    Extremity/Trunk Assessment   Upper Extremity Assessment Upper  Extremity Assessment: Defer to OT evaluation    Lower Extremity Assessment Lower Extremity Assessment: Generalized weakness;RLE deficits/detail;LLE deficits/detail (Grossly ~3+-4/5 with giveway weakness) RLE Sensation: WNL LLE Sensation: WNL    Cervical / Trunk Assessment Cervical / Trunk Assessment: Other exceptions Cervical / Trunk Exceptions: s/p spinal fxs  Communication   Communication: Interpreter utilized Allena Katz [phone])  Cognition Arousal/Alertness: Awake/alert Behavior During Therapy: WFL for tasks assessed/performed Overall Cognitive Status: Within Functional Limits for tasks assessed                                 General Comments: Appears WFL for basic mobility tasks but difficult tofully assess due to language barrier. Follows commands. Asking appropaite questions.      General Comments General comments (skin integrity, edema, etc.): Wife present during session.    Exercises     Assessment/Plan    PT Assessment Patient needs continued PT services  PT Problem List Decreased strength;Decreased mobility;Pain;Decreased balance;Decreased knowledge of use of DME;Decreased activity tolerance       PT Treatment Interventions Therapeutic activities;Gait training;Therapeutic exercise;Patient/family education;Balance training;Functional mobility training;Neuromuscular re-education;DME instruction;Stair training    PT Goals (Current goals can be found in the Care Plan section)  Acute Rehab PT Goals Patient Stated Goal: to find out what is causing this pain/weakness PT Goal Formulation: With patient Time For Goal Achievement: 02/29/20 Potential to Achieve Goals: Fair    Frequency Min 3X/week   Barriers to discharge Inaccessible home environment stairs to enter home    Co-evaluation               AM-PAC PT "6 Clicks" Mobility  Outcome Measure Help needed turning from your back to your side while in a flat bed without using bedrails?: A  Little Help needed moving from lying on your back to sitting on the side of a flat bed without using bedrails?: A Lot Help needed moving to and from a bed to a chair (including a wheelchair)?: A Little Help needed standing up from a chair using your arms (e.g., wheelchair or bedside chair)?: A Little Help needed to walk in hospital room?: A Lot Help needed climbing 3-5 steps with a railing? : Total 6 Click Score: 14    End of Session Equipment Utilized During Treatment: Gait belt Activity Tolerance: Patient tolerated treatment well Patient left: in bed;with call bell/phone within reach;with bed alarm set;with family/visitor present Nurse Communication: Mobility status PT Visit Diagnosis: Pain;Muscle weakness (generalized) (M62.81);Unsteadiness on feet (R26.81);Difficulty in walking, not elsewhere classified (R26.2) Pain - part of body:  (back , ribs)    Time: 1356-1430 PT Time Calculation (min) (ACUTE ONLY): 34 min   Charges:   PT Evaluation $PT Eval Moderate Complexity: 1 Mod PT Treatments $Therapeutic Activity: 8-22 mins        Marisa Severin, PT, DPT Acute Rehabilitation Services Pager [phone] Office [phone]      Marguarite Arbour A Sabra Heck 02/15/2020, 2:52 PM

## 2020-02-15 NOTE — Plan of Care (Signed)
  Problem: Education: Goal: Knowledge of General Education information will improve Description Including pain rating scale, medication(s)/side effects and non-pharmacologic comfort measures Outcome: Progressing   

## 2020-02-15 NOTE — Progress Notes (Signed)
Rehab Admissions Coordinator Note:  Patient was screened by Cleatrice Burke for appropriateness for an Inpatient Acute Rehab Consult per therapy recs.   At this time, we are recommending Inpatient Rehab consult. I will place order per protocol.  Cleatrice Burke RN MSN 02/15/2020, 4:20 PM  I can be reached at [phone].

## 2020-02-15 NOTE — Consult Note (Signed)
Reason for Consult: Renal failure Referring Physician: Dr. Doristine Bosworth  Chief Complaint: Generalized weakness  Assessment/Plan: 1. 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 Awaiting lab tests to confirm; no absolute indication for RRT at this time and no e/o obstruction on CT A/P.  - Hydrate aggressively at this time with strict I&O's, daily weights and close monitoring of UOP and respirator status. 2. Hypercalcemia - concerning for myeloma and at this time will hydrate aggressively with isotonic fluids. - Will also give Calcitonin + Zometa as at this level it's unlikely to be from sedentary status and almost certainly from myeloma and the lytic lesions. 3. HTN - resume home regimen. 4. Hyponatremia - may be an element of pseudo from the paraproteinemia. Will continue to monitor for now and will also check a urine and serum osmolality, TSH, cortisol. 5. Constipation from prerenal as well as the hypercalcemia. He also has impingement but 4-5/5 strength in the legs.  HPI: Trevor Shelton is an 54 y.o. male presenting with generalized weakness, left rib and lower back pain + constipation for a mth. He did sustain a fall from a ladder a mth ago and thought the pain was from the fall. In late July he had negative imaging and urinalysis in the ED with following injections for pain by orthopedics as an outpatient. He states that the pain is worse with deep inspiration and also has noted decreased urine output as well but denies obstructive like symptoms or incontinence. He also denies f/cn/v/h/a/ LOC or dyspnea. CT showed diffuse lytic lesions throughout the skeleton and pathologic thoracic fractures. He has been using Naproxen daily for the past 15 days and states he has intermittent streams of urine, decreased UOP and minimal burning on voiding but doesn't feel full after voiding. He denies any rashes or ESRD In the family and has never been told he has kidney  issues.  ROS Pertinent items are noted in HPI.  Chemistry and CBC: Creatinine, Ser  Date/Time Value Ref Range Status  02/15/2020 02:02 AM 4.92 (H) 0.61 - 1.24 mg/dL Final  02/14/2020 12:52 PM 4.73 (H) 0.61 - 1.24 mg/dL Final   Recent Labs  Lab 02/14/20 1252 02/15/20 0202  NA 125* 130*  K 4.4 4.9  CL 82* 89*  CO2 29 32  GLUCOSE 158* 116*  BUN 84* 81*  CREATININE 4.73* 4.92*  CALCIUM >15.0* >15.0*  PHOS  --  6.3*   Recent Labs  Lab 02/14/20 1252 02/15/20 0202  WBC 13.3* 11.6*  HGB 14.2 12.8*  HCT 41.2 37.0*  MCV 85.1 83.7  PLT 212 174   Liver Function Tests: Recent Labs  Lab 02/14/20 1252 02/15/20 0202  AST 34 28  ALT 42 41  ALKPHOS 66 58  BILITOT 1.3* 0.7  PROT 6.8 6.2*  ALBUMIN 3.3* 2.9*   Recent Labs  Lab 02/14/20 1252  LIPASE 28   No results for input(s): AMMONIA in the last 168 hours. Cardiac Enzymes: No results for input(s): CKTOTAL, CKMB, CKMBINDEX, TROPONINI in the last 168 hours. Iron Studies: No results for input(s): IRON, TIBC, TRANSFERRIN, FERRITIN in the last 72 hours. PT/INR: _0 (inr:5)  Xrays/Other Studies: ) Results for orders placed or performed during the hospital encounter of 02/14/20 (from the past 48 hour(s))  CBC     Status: Abnormal   Collection Time: 02/14/20 12:52 PM  Result Value Ref Range   WBC 13.3 (H) 4.0 - 10.5 K/uL   RBC 4.84 4.22 - 5.81 MIL/uL  Hemoglobin 14.2 13.0 - 17.0 g/dL   HCT 41.2 39 - 52 %   MCV 85.1 80.0 - 100.0 fL   MCH 29.3 26.0 - 34.0 pg   MCHC 34.5 30.0 - 36.0 g/dL   RDW 11.2 (L) 11.5 - 15.5 %   Platelets 212 150 - 400 K/uL   nRBC 0.0 0.0 - 0.2 %    Comment: Performed at [facility] [address] [place], [place] [address]  Urinalysis, Routine w reflex microscopic     Status: Abnormal   Collection Time: 02/14/20 12:52 PM  Result Value Ref Range   Color, Urine STRAW (A) YELLOW   APPearance CLEAR CLEAR   Specific Gravity, Urine 1.012 1.005 - 1.030   pH 6.0 5.0 - 8.0   Glucose,  UA NEGATIVE NEGATIVE mg/dL   Hgb urine dipstick NEGATIVE NEGATIVE   Bilirubin Urine NEGATIVE NEGATIVE   Ketones, ur NEGATIVE NEGATIVE mg/dL   Protein, ur NEGATIVE NEGATIVE mg/dL   Nitrite NEGATIVE NEGATIVE   Leukocytes,Ua NEGATIVE NEGATIVE    Comment: Performed at [facility] [address] [place], [place] [address]  Comprehensive metabolic panel     Status: Abnormal   Collection Time: 02/14/20 12:52 PM  Result Value Ref Range   Sodium 125 (L) 135 - 145 mmol/L   Potassium 4.4 3.5 - 5.1 mmol/L   Chloride 82 (L) 98 - 111 mmol/L   CO2 29 22 - 32 mmol/L   Glucose, Bld 158 (H) 70 - 99 mg/dL    Comment: Glucose reference range applies only to samples taken after fasting for at least 8 hours.   BUN 84 (H) 6 - 20 mg/dL   Creatinine, Ser 4.73 (H) 0.61 - 1.24 mg/dL   Calcium >15.0 (HH) 8.9 - 10.3 mg/dL    Comment: RESULT REPEATED AND VERIFIED CRITICAL RESULT CALLED TO, READ BACK BY AND VERIFIED WITH: K.NEWNAM,RN 1401 02/14/20 CLARK,S    Total Protein 6.8 6.5 - 8.1 g/dL   Albumin 3.3 (L) 3.5 - 5.0 g/dL   AST 34 15 - 41 U/L   ALT 42 0 - 44 U/L   Alkaline Phosphatase 66 38 - 126 U/L   Total Bilirubin 1.3 (H) 0.3 - 1.2 mg/dL   GFR calc non Af Amer 13 (L) >60 mL/min   GFR calc Af Amer 15 (L) >60 mL/min   Anion gap 14 5 - 15    Comment: Performed at [facility], [place] [address], [place], [place] [address]  Lipase, blood     Status: None   Collection Time: 02/14/20 12:52 PM  Result Value Ref Range   Lipase 28 11 - 51 U/L    Comment: Performed at [facility], [place] [address] [place], [place] [address]  TSH     Status: None   Collection Time: 02/14/20  4:02 PM  Result Value Ref Range   TSH 0.508 0.350 - 4.500 uIU/mL    Comment: Performed by a 3rd Generation assay with a functional sensitivity of <=0.01 uIU/mL. Performed at [facility], [place] [address] [place], [place] [address]   SARS Coronavirus 2 by RT PCR (hospital order, performed in [facility] hospital  lab) Nasopharyngeal Nasopharyngeal Swab     Status: None   Collection Time: 02/14/20  4:05 PM   Specimen: Nasopharyngeal Swab  Result Value Ref Range   SARS Coronavirus 2 NEGATIVE NEGATIVE    Comment: (NOTE) SARS-CoV-2 target nucleic acids are NOT DETECTED.  The SARS-CoV-2  RNA is generally detectable in upper and lower respiratory specimens during the acute phase of infection. The lowest concentration of SARS-CoV-2 viral copies this assay can detect is 250 copies / mL. A negative result does not preclude SARS-CoV-2 infection and should not be used as the sole basis for treatment or other patient management decisions.  A negative result may occur with improper specimen collection / handling, submission of specimen other than nasopharyngeal swab, presence of viral mutation(s) within the areas targeted by this assay, and inadequate number of viral copies (<250 copies / mL). A negative result must be combined with clinical observations, patient history, and epidemiological information.  Fact Sheet for Patients:   StrictlyIdeas.no  Fact Sheet for Healthcare Providers: BankingDealers.co.za  This test is not yet approved or  cleared by the [place] FDA and has been authorized for detection and/or diagnosis of SARS-CoV-2 by FDA under an Emergency Use Authorization (EUA).  This EUA will remain in effect (meaning this test can be used) for the duration of the COVID-19 declaration under Section 564(b)(1) of the Act, 21 U.S.C. section 360bbb-3(b)(1), unless the authorization is terminated or revoked sooner.  Performed at [facility], [place] [address], [place], [place] [address]   Magnesium     Status: None   Collection Time: 02/15/20  2:02 AM  Result Value Ref Range   Magnesium 1.8 1.7 - 2.4 mg/dL    Comment: Performed at [facility], [place] [address], [place], [place] [address]  Phosphorus     Status: Abnormal   Collection  Time: 02/15/20  2:02 AM  Result Value Ref Range   Phosphorus 6.3 (H) 2.5 - 4.6 mg/dL    Comment: Performed at [facility] [address] [place],  [address]  Comprehensive metabolic panel     Status: Abnormal   Collection Time: 02/15/20  2:02 AM  Result Value Ref Range   Sodium 130 (L) 135 - 145 mmol/L   Potassium 4.9 3.5 - 5.1 mmol/L   Chloride 89 (L) 98 - 111 mmol/L   CO2 32 22 - 32 mmol/L   Glucose, Bld 116 (H) 70 - 99 mg/dL    Comment: Glucose reference range applies only to samples taken after fasting for at least 8 hours.   BUN 81 (H) 6 - 20 mg/dL   Creatinine, Ser 4.92 (H) 0.61 - 1.24 mg/dL   Calcium >15.0 (HH) 8.9 - 10.3 mg/dL    Comment: REPEATED TO VERIFY CRITICAL RESULT CALLED TO, READ BACK BY AND VERIFIED WITH: Edgardo Roys RN [id number] M GARRETT    Total Protein 6.2 (L) 6.5 - 8.1 g/dL   Albumin 2.9 (L) 3.5 - 5.0 g/dL   AST 28 15 - 41 U/L   ALT 41 0 - 44 U/L   Alkaline Phosphatase 58 38 - 126 U/L   Total Bilirubin 0.7 0.3 - 1.2 mg/dL   GFR calc non Af Amer 12 (L) >60 mL/min   GFR calc Af Amer 14 (L) >60 mL/min   Anion gap 9 5 - 15    Comment: Performed at [facility], [place] [address], [place] [address]  CBC     Status: Abnormal   Collection Time: 02/15/20  2:02 AM  Result Value Ref Range   WBC 11.6 (H) 4.0 - 10.5 K/uL   RBC 4.42 4.22 - 5.81 MIL/uL   Hemoglobin 12.8 (L) 13.0 - 17.0 g/dL   HCT 37.0 (L) 39 - 52 %  MCV 83.7 80.0 - 100.0 fL   MCH 29.0 26.0 - 34.0 pg   MCHC 34.6 30.0 - 36.0 g/dL   RDW 11.1 (L) 11.5 - 15.5 %   Platelets 174 150 - 400 K/uL   nRBC 0.0 0.0 - 0.2 %    Comment: Performed at [facility] [address], [place], [place] [address]   CT ABDOMEN PELVIS WO CONTRAST  Result Date: 02/14/2020 CLINICAL DATA:  Abdominal trauma. Trauma 1 month ago. Hypercalcemia. Fell 1 month ago. Abdominal pain, LOWER back pain, vomiting, diarrhea since the fall. EXAM: CT CHEST, ABDOMEN AND PELVIS WITHOUT CONTRAST TECHNIQUE:  Multidetector CT imaging of the chest, abdomen and pelvis was performed following the standard protocol without IV contrast. COMPARISON:  None. FINDINGS: CT CHEST FINDINGS Cardiovascular: Heart is normal in size. Minimal atherosclerotic calcification of the coronary vessels. There is focal calcification of the ascending thoracic aorta, not associated with aneurysm. Normal noncontrast appearance of the pulmonary arteries. Mediastinum/Nodes: The visualized portion of the thyroid gland has a normal appearance. No significant mediastinal, hilar, or axillary adenopathy. Lungs/Pleura: There is subsegmental atelectasis at the lung bases. No suspicious pulmonary nodules. There is focal pleural thickening adjacent to LEFT anterior second rib lesion. Musculoskeletal: Diffuse lytic lesions are identified throughout the skeleton. Lesions vary in size from 2 millimeters to 1.8 centimeters. There is near complete replacement of the T5 vertebral body and loss of vertebral body height by approximately 30%. A soft tissue component extends to the RIGHT of the vertebral body and extends into the canal, impinging on the spinal cord, image 25 of series 3. Soft tissue mass in this region is 4.2 x 3.2 centimeters. At T9, there is near complete replacement of the vertebral body with soft tissue component to the RIGHT of the vertebral body. Loss of vertebral body height is approximately 30%. Suspect impingement of the cord at this level. Image 38 of series 3. Large lytic lesion is identified within the T5 vertebral body, measuring 1.8 centimeters. This lesion extends to the RIGHT cortex 2. Acute fracture of LEFT ribs 9 and 10, possibly pathologic. There is a soft tissue component associated with lesions in the LEFT second rib. Soft tissue component measures 1.1 x 1.8 centimeters. CT ABDOMEN PELVIS FINDINGS Hepatobiliary: No focal liver abnormality is seen. No gallstones, gallbladder wall thickening, or biliary dilatation. Pancreas:  Unremarkable. No pancreatic ductal dilatation or surrounding inflammatory changes. Spleen: Normal in size without focal abnormality. Adrenals/Urinary Tract: Adrenal glands are unremarkable. Kidneys are normal, without renal calculi, focal lesion, or hydronephrosis. Bladder is unremarkable. Stomach/Bowel: Stomach and small bowel loops are unremarkable. The appendix is well seen and has a normal appearance. Loops of colon are unremarkable. Average stool burden. Vascular/Lymphatic: No significant vascular findings are present. No enlarged abdominal or pelvic lymph nodes. Reproductive: Prostate is mildly enlarged. There are prostatic calcifications. Other: Fat containing RIGHT inguinal hernia.  No ascites. Musculoskeletal: Diffuse lytic lesions throughout the pelvis, proximal femurs, and lumbar spine. IMPRESSION: 1. Diffuse lytic lesions throughout the skeleton, consistent with multiple myeloma or metastatic disease. Multiple myeloma is favored. 2. Pathologic fractures at T5 and T9. These levels show significant soft tissue mass, and impingement upon the spinal cord. Recommend further evaluation with MRI of the thoracic spine with and without contrast. 3. Acute fractures of LEFT ribs 9 and 10, possibly pathologic. 4. No evidence for acute injury of the abdomen or pelvis. 5. Fat containing RIGHT inguinal hernia. 6. Mildly enlarged prostate. 7. Aortic Atherosclerosis (ICD10-I70.0). These results were  called by telephone at the time of interpretation on 02/14/2020 at 4:06 pm to provider [facility] , who verbally acknowledged these results. Electronically Signed   By: Nolon Nations M.D.   On: 02/14/2020 16:07   CT Chest Wo Contrast  Result Date: 02/14/2020 CLINICAL DATA:  Abdominal trauma. Trauma 1 month ago. Hypercalcemia. Fell 1 month ago. Abdominal pain, LOWER back pain, vomiting, diarrhea since the fall. EXAM: CT CHEST, ABDOMEN AND PELVIS WITHOUT CONTRAST TECHNIQUE: Multidetector CT imaging of the chest, abdomen and  pelvis was performed following the standard protocol without IV contrast. COMPARISON:  None. FINDINGS: CT CHEST FINDINGS Cardiovascular: Heart is normal in size. Minimal atherosclerotic calcification of the coronary vessels. There is focal calcification of the ascending thoracic aorta, not associated with aneurysm. Normal noncontrast appearance of the pulmonary arteries. Mediastinum/Nodes: The visualized portion of the thyroid gland has a normal appearance. No significant mediastinal, hilar, or axillary adenopathy. Lungs/Pleura: There is subsegmental atelectasis at the lung bases. No suspicious pulmonary nodules. There is focal pleural thickening adjacent to LEFT anterior second rib lesion. Musculoskeletal: Diffuse lytic lesions are identified throughout the skeleton. Lesions vary in size from 2 millimeters to 1.8 centimeters. There is near complete replacement of the T5 vertebral body and loss of vertebral body height by approximately 30%. A soft tissue component extends to the RIGHT of the vertebral body and extends into the canal, impinging on the spinal cord, image 25 of series 3. Soft tissue mass in this region is 4.2 x 3.2 centimeters. At T9, there is near complete replacement of the vertebral body with soft tissue component to the RIGHT of the vertebral body. Loss of vertebral body height is approximately 30%. Suspect impingement of the cord at this level. Image 38 of series 3. Large lytic lesion is identified within the T5 vertebral body, measuring 1.8 centimeters. This lesion extends to the RIGHT cortex 2. Acute fracture of LEFT ribs 9 and 10, possibly pathologic. There is a soft tissue component associated with lesions in the LEFT second rib. Soft tissue component measures 1.1 x 1.8 centimeters. CT ABDOMEN PELVIS FINDINGS Hepatobiliary: No focal liver abnormality is seen. No gallstones, gallbladder wall thickening, or biliary dilatation. Pancreas: Unremarkable. No pancreatic ductal dilatation or  surrounding inflammatory changes. Spleen: Normal in size without focal abnormality. Adrenals/Urinary Tract: Adrenal glands are unremarkable. Kidneys are normal, without renal calculi, focal lesion, or hydronephrosis. Bladder is unremarkable. Stomach/Bowel: Stomach and small bowel loops are unremarkable. The appendix is well seen and has a normal appearance. Loops of colon are unremarkable. Average stool burden. Vascular/Lymphatic: No significant vascular findings are present. No enlarged abdominal or pelvic lymph nodes. Reproductive: Prostate is mildly enlarged. There are prostatic calcifications. Other: Fat containing RIGHT inguinal hernia.  No ascites. Musculoskeletal: Diffuse lytic lesions throughout the pelvis, proximal femurs, and lumbar spine. IMPRESSION: 1. Diffuse lytic lesions throughout the skeleton, consistent with multiple myeloma or metastatic disease. Multiple myeloma is favored. 2. Pathologic fractures at T5 and T9. These levels show significant soft tissue mass, and impingement upon the spinal cord. Recommend further evaluation with MRI of the thoracic spine with and without contrast. 3. Acute fractures of LEFT ribs 9 and 10, possibly pathologic. 4. No evidence for acute injury of the abdomen or pelvis. 5. Fat containing RIGHT inguinal hernia. 6. Mildly enlarged prostate. 7. Aortic Atherosclerosis (ICD10-I70.0). These results were called by telephone at the time of interpretation on 02/14/2020 at 4:06 pm to provider [facility] , who verbally acknowledged these results. Electronically Signed   By:  Nolon Nations M.D.   On: 02/14/2020 16:07   MR THORACIC SPINE W WO CONTRAST  Result Date: 02/14/2020 CLINICAL DATA:  Back pain. Compression fracture. Fever with nausea and vomiting. EXAM: MRI THORACIC WITHOUT AND WITH CONTRAST TECHNIQUE: Multiplanar and multiecho pulse sequences of the thoracic spine were obtained without and with intravenous contrast. CONTRAST:  26m GADAVIST GADOBUTROL 1 MMOL/ML IV SOLN  COMPARISON:  None. FINDINGS: MRI THORACIC SPINE FINDINGS Alignment:  Normal Vertebrae: There are numerous bone marrow lesions throughout all visualized levels, but worst at T5, T6 and T9. There is compression deformity of T6 with 50% height loss and 5 mm of retropulsion. There is compression deformity of T9 with approximately 10% height loss and 3 mm of retropulsion. Cord: There is mild hyperintense T2-weighted signal in the spinal cord at T5. Paraspinal and other soft tissues: There is soft tissue mass is associated with the vertebral lesions at T6 and T9. Otherwise, the paraspinal soft tissues are unremarkable. Disc levels: T5-6: Contrast-enhancing mass replaces most of the vertebral body bone marrow and extends into the pedicles, transverse processes and the right paraspinal tissues. There is also epidural extension that severely narrows the thecal sac and causes mass effect on the spinal cord. The mass also severely narrows the right T5 neural foramen. T8-9: Soft tissue mass replacing much of the bone marrow at T9 with epidural extension mildly narrowing the thecal sac with slight mass effect on the spinal cord. The mass extends into the right neural foramen. IMPRESSION: 1. Numerous bone marrow lesions throughout all visualized levels, but worst at T5, T6 and T9. 2. 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 The T6 lesion severely narrows the thecal sac and deforms the spinal cord. 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 3. Pathologic fractures of T6 and T9 with 3-5 mm of retropulsion. Electronically Signed   By: KUlyses JarredM.D.   On: 02/14/2020 19:14    PMH:  History reviewed. No pertinent past medical history.  PSH:  History reviewed. No pertinent surgical history.  Allergies: No Known Allergies  Medications:   Prior to Admission medications   Medication Sig Start Date End Date Taking? Authorizing Provider   lisinopril-hydrochlorothiazide (ZESTORETIC) 20-25 MG tablet Take 1 tablet by mouth daily. 02/01/20  Yes [provider]  simvastatin (ZOCOR) 20 MG tablet Take 20 mg by mouth daily. 02/01/20  Yes [provider]  HYDROcodone-acetaminophen (NORCO/VICODIN) 5-325 MG tablet Take 1 tablet by mouth every 4 (four) hours as needed. Patient not taking: Reported on 02/14/2020 01/26/20   Henderly, Britni A, PA-C  lidocaine (LIDODERM) 5 % Place 1 patch onto the skin daily. Remove & Discard patch within 12 hours or as directed by MD Patient not taking: Reported on 02/14/2020 01/26/20   Henderly, Britni A, PA-C    Discontinued Meds:   Medications Discontinued During This Encounter  Medication Reason  . 0.9% NaCl bolus PEDS   . enoxaparin (LOVENOX) injection 40 mg     Social History:  reports that he has never smoked. He has never used smokeless tobacco. He reports previous alcohol use. He reports previous drug use.  Family History:  History reviewed. No pertinent family history.  Blood pressure (!) 152/72, pulse 68, temperature 97.6 F (36.4 C), temperature source Oral, resp. rate 17, height '5\' 3"'$  (1.6 m), weight 72.6 kg, SpO2 100 %. General appearance: alert, cooperative and appears stated age Head: Normocephalic, without obvious abnormality, atraumatic Eyes: Conj pallor  Neck: no adenopathy, no carotid bruit, no JVD, supple, symmetrical, trachea midline and thyroid not enlarged, symmetric, no tenderness/mass/nodules Back: symmetric, no curvature. ROM normal.  Resp: clear to auscultation bilaterally Chest wall: tenderness more towards the lateral left side Cardio: tachy GI: soft, non-tender; bowel sounds normal; no masses,  no organomegaly Extremities: extremities normal, atraumatic, no cyanosis or edema Pulses: 2+ and symmetric Skin: Skin color, texture, turgor normal. No rashes or lesions       Hamza Empson, Hunt Oris, MD 02/15/2020, 10:14 AM

## 2020-02-15 NOTE — Progress Notes (Signed)
PROGRESS NOTE    Trevor Shelton  FMB:[id number] DOB: June 09, 1966 DOA: 02/14/2020 PCP: Patient, No Pcp Per    Brief Narrative:  Trevor Shelton is a 54 y.o. male with no significant past medical history presents to emergency department with generalized weakness, left rib pain, lower back pain, constipation since 1 monthPatient does not speak [place].  Patient's son at the bedside at the historian.  He tells me that patient fell off of a ladder from a height of around 4 feet 1 month ago and landed on his left side and since then he started having back pain, left rib pain, and weakness in bilateral lower extremities which is getting worse. Marland KitchenUpon arrival to ED: Patient's vital signs are stable, CBC shows WBC of 13.3, UA negative for infection, CMP shows sodium of 125, severe AKI, calcium more than 15, lipase, TSH: WNL, COVID-19 and PTH: Pending.  CT abdomen pelvis shows diffuse lytic lesions throughout the skeleton consistent with multiple myeloma or metastatic disease.  Pathologic fractures at T5 and T9, acute fracture of left rib 9 and 10.   EDP consulted neurosurgery and oncology.  Oncology recommended to check SPEP and UPEP.  [facility] consulted for admission for hypercalcemia/AKI?  Multiple myeloma.    Consultants:   Nephrology, neurosurgery, oncology  Procedures:   Antimicrobials:       Subjective: Complaining of left rib cage pain that is causing him not to be able to take a deep breath.  Still feels weak in the legs.  Otherwise no other complaints this  Objective: Vitals:   02/14/20 2203 02/15/20 0146 02/15/20 0522 02/15/20 1042  BP: (!) 156/79 (!) 157/72 (!) 152/72 (!) 154/81  Pulse: 79 70 68 62  Resp: '16 14 17 18  ' Temp: 98.3 F (36.8 C) 97.6 F (36.4 C) 97.6 F (36.4 C) 98.3 F (36.8 C)  TempSrc: Oral Oral Oral Oral  SpO2: 99% 98% 100% 97%  Weight: 72.6 kg     Height: '5\' 3"'  (1.6 m)       Intake/Output Summary (Last 24 hours) at 02/15/2020 1335 Last data filed at  02/15/2020 0546 Gross per 24 hour  Intake 350 ml  Output 1125 ml  Net -775 ml   Filed Weights   02/14/20 2203  Weight: 72.6 kg    Examination:  General exam: Appears calm and comfortable, wife at bedside Respiratory system: Clear to auscultation. Respiratory effort normal. Cardiovascular system: S1 & S2 heard, RRR. No JVD, murmurs, rubs, gallops or clicks.  Gastrointestinal system: Abdomen is nondistended, soft and nontender. Normal bowel sounds heard. Central nervous system: Alert and oriented x3 Extremities: No edema Skin: Warm and dry Psychiatry: Judgement and insight appear normal. Mood & affect appropriate.     Data Reviewed: I have personally reviewed following labs and imaging studies  CBC: Recent Labs  Lab 02/14/20 1252 02/15/20 0202  WBC 13.3* 11.6*  HGB 14.2 12.8*  HCT 41.2 37.0*  MCV 85.1 83.7  PLT 212 701   Basic Metabolic Panel: Recent Labs  Lab 02/14/20 1252 02/15/20 0202  NA 125* 130*  K 4.4 4.9  CL 82* 89*  CO2 29 32  GLUCOSE 158* 116*  BUN 84* 81*  CREATININE 4.73* 4.92*  CALCIUM >15.0* >15.0*  MG  --  1.8  PHOS  --  6.3*   GFR: Estimated Creatinine Clearance: 15 mL/min (A) (by C-G formula based on SCr of 4.92 mg/dL (H)). Liver Function Tests: Recent Labs  Lab 02/14/20 1252 02/15/20 0202  AST 34 28  ALT 42 41  ALKPHOS 66 58  BILITOT 1.3* 0.7  PROT 6.8 6.2*  ALBUMIN 3.3* 2.9*   Recent Labs  Lab 02/14/20 1252  LIPASE 28   No results for input(s): AMMONIA in the last 168 hours. Coagulation Profile: Recent Labs  Lab 02/15/20 1150  INR 1.1   Cardiac Enzymes: No results for input(s): CKTOTAL, CKMB, CKMBINDEX, TROPONINI in the last 168 hours. BNP (last 3 results) No results for input(s): PROBNP in the last 8760 hours. HbA1C: No results for input(s): HGBA1C in the last 72 hours. CBG: No results for input(s): GLUCAP in the last 168 hours. Lipid Profile: No results for input(s): CHOL, HDL, LDLCALC, TRIG, CHOLHDL, LDLDIRECT  in the last 72 hours. Thyroid Function Tests: Recent Labs    02/14/20 1602  TSH 0.508   Anemia Panel: No results for input(s): VITAMINB12, FOLATE, FERRITIN, TIBC, IRON, RETICCTPCT in the last 72 hours. Sepsis Labs: No results for input(s): PROCALCITON, LATICACIDVEN in the last 168 hours.  Recent Results (from the past 240 hour(s))  SARS Coronavirus 2 by RT PCR (hospital order, performed in [facility] hospital lab) Nasopharyngeal Nasopharyngeal Swab     Status: None   Collection Time: 02/14/20  4:05 PM   Specimen: Nasopharyngeal Swab  Result Value Ref Range Status   SARS Coronavirus 2 NEGATIVE NEGATIVE Final    Comment: (NOTE) SARS-CoV-2 target nucleic acids are NOT DETECTED.  The SARS-CoV-2 RNA is generally detectable in upper and lower respiratory specimens during the acute phase of infection. The lowest concentration of SARS-CoV-2 viral copies this assay can detect is 250 copies / mL. A negative result does not preclude SARS-CoV-2 infection and should not be used as the sole basis for treatment or other patient management decisions.  A negative result may occur with improper specimen collection / handling, submission of specimen other than nasopharyngeal swab, presence of viral mutation(s) within the areas targeted by this assay, and inadequate number of viral copies (<250 copies / mL). A negative result must be combined with clinical observations, patient history, and epidemiological information.  Fact Sheet for Patients:   StrictlyIdeas.no  Fact Sheet for Healthcare Providers: BankingDealers.co.za  This test is not yet approved or  cleared by the [place] FDA and has been authorized for detection and/or diagnosis of SARS-CoV-2 by FDA under an Emergency Use Authorization (EUA).  This EUA will remain in effect (meaning this test can be used) for the duration of the COVID-19 declaration under Section 564(b)(1) of the Act,  21 U.S.C. section 360bbb-3(b)(1), unless the authorization is terminated or revoked sooner.  Performed at [facility], [place] [address] [place], [place] [address]          Radiology Studies: CT ABDOMEN PELVIS WO CONTRAST  Result Date: 02/14/2020 CLINICAL DATA:  Abdominal trauma. Trauma 1 month ago. Hypercalcemia. Fell 1 month ago. Abdominal pain, LOWER back pain, vomiting, diarrhea since the fall. EXAM: CT CHEST, ABDOMEN AND PELVIS WITHOUT CONTRAST TECHNIQUE: Multidetector CT imaging of the chest, abdomen and pelvis was performed following the standard protocol without IV contrast. COMPARISON:  None. FINDINGS: CT CHEST FINDINGS Cardiovascular: Heart is normal in size. Minimal atherosclerotic calcification of the coronary vessels. There is focal calcification of the ascending thoracic aorta, not associated with aneurysm. Normal noncontrast appearance of the pulmonary arteries. Mediastinum/Nodes: The visualized portion of the thyroid gland has a normal appearance. No significant mediastinal, hilar, or axillary adenopathy. Lungs/Pleura: There is subsegmental atelectasis at the lung bases. No suspicious pulmonary nodules. There is focal  pleural thickening adjacent to LEFT anterior second rib lesion. Musculoskeletal: Diffuse lytic lesions are identified throughout the skeleton. Lesions vary in size from 2 millimeters to 1.8 centimeters. There is near complete replacement of the T5 vertebral body and loss of vertebral body height by approximately 30%. A soft tissue component extends to the RIGHT of the vertebral body and extends into the canal, impinging on the spinal cord, image 25 of series 3. Soft tissue mass in this region is 4.2 x 3.2 centimeters. At T9, there is near complete replacement of the vertebral body with soft tissue component to the RIGHT of the vertebral body. Loss of vertebral body height is approximately 30%. Suspect impingement of the cord at this level. Image 38 of series 3. Large  lytic lesion is identified within the T5 vertebral body, measuring 1.8 centimeters. This lesion extends to the RIGHT cortex 2. Acute fracture of LEFT ribs 9 and 10, possibly pathologic. There is a soft tissue component associated with lesions in the LEFT second rib. Soft tissue component measures 1.1 x 1.8 centimeters. CT ABDOMEN PELVIS FINDINGS Hepatobiliary: No focal liver abnormality is seen. No gallstones, gallbladder wall thickening, or biliary dilatation. Pancreas: Unremarkable. No pancreatic ductal dilatation or surrounding inflammatory changes. Spleen: Normal in size without focal abnormality. Adrenals/Urinary Tract: Adrenal glands are unremarkable. Kidneys are normal, without renal calculi, focal lesion, or hydronephrosis. Bladder is unremarkable. Stomach/Bowel: Stomach and small bowel loops are unremarkable. The appendix is well seen and has a normal appearance. Loops of colon are unremarkable. Average stool burden. Vascular/Lymphatic: No significant vascular findings are present. No enlarged abdominal or pelvic lymph nodes. Reproductive: Prostate is mildly enlarged. There are prostatic calcifications. Other: Fat containing RIGHT inguinal hernia.  No ascites. Musculoskeletal: Diffuse lytic lesions throughout the pelvis, proximal femurs, and lumbar spine. IMPRESSION: 1. Diffuse lytic lesions throughout the skeleton, consistent with multiple myeloma or metastatic disease. Multiple myeloma is favored. 2. Pathologic fractures at T5 and T9. These levels show significant soft tissue mass, and impingement upon the spinal cord. Recommend further evaluation with MRI of the thoracic spine with and without contrast. 3. Acute fractures of LEFT ribs 9 and 10, possibly pathologic. 4. No evidence for acute injury of the abdomen or pelvis. 5. Fat containing RIGHT inguinal hernia. 6. Mildly enlarged prostate. 7. Aortic Atherosclerosis (ICD10-I70.0). These results were called by telephone at the time of interpretation on  02/14/2020 at 4:06 pm to provider [facility] , who verbally acknowledged these results. Electronically Signed   By: Nolon Nations M.D.   On: 02/14/2020 16:07   CT Chest Wo Contrast  Result Date: 02/14/2020 CLINICAL DATA:  Abdominal trauma. Trauma 1 month ago. Hypercalcemia. Fell 1 month ago. Abdominal pain, LOWER back pain, vomiting, diarrhea since the fall. EXAM: CT CHEST, ABDOMEN AND PELVIS WITHOUT CONTRAST TECHNIQUE: Multidetector CT imaging of the chest, abdomen and pelvis was performed following the standard protocol without IV contrast. COMPARISON:  None. FINDINGS: CT CHEST FINDINGS Cardiovascular: Heart is normal in size. Minimal atherosclerotic calcification of the coronary vessels. There is focal calcification of the ascending thoracic aorta, not associated with aneurysm. Normal noncontrast appearance of the pulmonary arteries. Mediastinum/Nodes: The visualized portion of the thyroid gland has a normal appearance. No significant mediastinal, hilar, or axillary adenopathy. Lungs/Pleura: There is subsegmental atelectasis at the lung bases. No suspicious pulmonary nodules. There is focal pleural thickening adjacent to LEFT anterior second rib lesion. Musculoskeletal: Diffuse lytic lesions are identified throughout the skeleton. Lesions vary in size from 2 millimeters to 1.8 centimeters.  There is near complete replacement of the T5 vertebral body and loss of vertebral body height by approximately 30%. A soft tissue component extends to the RIGHT of the vertebral body and extends into the canal, impinging on the spinal cord, image 25 of series 3. Soft tissue mass in this region is 4.2 x 3.2 centimeters. At T9, there is near complete replacement of the vertebral body with soft tissue component to the RIGHT of the vertebral body. Loss of vertebral body height is approximately 30%. Suspect impingement of the cord at this level. Image 38 of series 3. Large lytic lesion is identified within the T5 vertebral body,  measuring 1.8 centimeters. This lesion extends to the RIGHT cortex 2. Acute fracture of LEFT ribs 9 and 10, possibly pathologic. There is a soft tissue component associated with lesions in the LEFT second rib. Soft tissue component measures 1.1 x 1.8 centimeters. CT ABDOMEN PELVIS FINDINGS Hepatobiliary: No focal liver abnormality is seen. No gallstones, gallbladder wall thickening, or biliary dilatation. Pancreas: Unremarkable. No pancreatic ductal dilatation or surrounding inflammatory changes. Spleen: Normal in size without focal abnormality. Adrenals/Urinary Tract: Adrenal glands are unremarkable. Kidneys are normal, without renal calculi, focal lesion, or hydronephrosis. Bladder is unremarkable. Stomach/Bowel: Stomach and small bowel loops are unremarkable. The appendix is well seen and has a normal appearance. Loops of colon are unremarkable. Average stool burden. Vascular/Lymphatic: No significant vascular findings are present. No enlarged abdominal or pelvic lymph nodes. Reproductive: Prostate is mildly enlarged. There are prostatic calcifications. Other: Fat containing RIGHT inguinal hernia.  No ascites. Musculoskeletal: Diffuse lytic lesions throughout the pelvis, proximal femurs, and lumbar spine. IMPRESSION: 1. Diffuse lytic lesions throughout the skeleton, consistent with multiple myeloma or metastatic disease. Multiple myeloma is favored. 2. Pathologic fractures at T5 and T9. These levels show significant soft tissue mass, and impingement upon the spinal cord. Recommend further evaluation with MRI of the thoracic spine with and without contrast. 3. Acute fractures of LEFT ribs 9 and 10, possibly pathologic. 4. No evidence for acute injury of the abdomen or pelvis. 5. Fat containing RIGHT inguinal hernia. 6. Mildly enlarged prostate. 7. Aortic Atherosclerosis (ICD10-I70.0). These results were called by telephone at the time of interpretation on 02/14/2020 at 4:06 pm to provider [facility] , who verbally  acknowledged these results. Electronically Signed   By: Nolon Nations M.D.   On: 02/14/2020 16:07   MR THORACIC SPINE W WO CONTRAST  Result Date: 02/14/2020 CLINICAL DATA:  Back pain. Compression fracture. Fever with nausea and vomiting. EXAM: MRI THORACIC WITHOUT AND WITH CONTRAST TECHNIQUE: Multiplanar and multiecho pulse sequences of the thoracic spine were obtained without and with intravenous contrast. CONTRAST:  87m GADAVIST GADOBUTROL 1 MMOL/ML IV SOLN COMPARISON:  None. FINDINGS: MRI THORACIC SPINE FINDINGS Alignment:  Normal Vertebrae: There are numerous bone marrow lesions throughout all visualized levels, but worst at T5, T6 and T9. There is compression deformity of T6 with 50% height loss and 5 mm of retropulsion. There is compression deformity of T9 with approximately 10% height loss and 3 mm of retropulsion. Cord: There is mild hyperintense T2-weighted signal in the spinal cord at T5. Paraspinal and other soft tissues: There is soft tissue mass is associated with the vertebral lesions at T6 and T9. Otherwise, the paraspinal soft tissues are unremarkable. Disc levels: T5-6: Contrast-enhancing mass replaces most of the vertebral body bone marrow and extends into the pedicles, transverse processes and the right paraspinal tissues. There is also epidural extension that severely narrows the thecal  sac and causes mass effect on the spinal cord. The mass also severely narrows the right T5 neural foramen. T8-9: Soft tissue mass replacing much of the bone marrow at T9 with epidural extension mildly narrowing the thecal sac with slight mass effect on the spinal cord. The mass extends into the right neural foramen. IMPRESSION: 1. Numerous bone marrow lesions throughout all visualized levels, but worst at T5, T6 and T9. 2. 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 The T6 lesion severely narrows the thecal sac and deforms the spinal cord. Hyperintense T2-weighted signal in the spinal cord at the T5 level is  consistent with compressive myelopathy. 3. Pathologic fractures of T6 and T9 with 3-5 mm of retropulsion. Electronically Signed   By: Ulyses Jarred M.D.   On: 02/14/2020 19:14        Scheduled Meds: . calcitonin  4 Units/kg Subcutaneous BID  . docusate sodium  100 mg Oral Daily  . heparin injection (subcutaneous)  5,000 Units Subcutaneous Q8H  . lidocaine  1 patch Transdermal Q24H  . polyethylene glycol  17 g Oral Daily  . sodium chloride flush  3 mL Intravenous Once   Continuous Infusions: . sodium chloride 150 mL/hr at 02/15/20 0516    Assessment & Plan:   Principal Problem:   Hypercalcemia Active Problems:   AKI (acute kidney injury) ([place])   Pathologic fracture   Hyponatremia   Leukocytosis   Hypercalcemia with T6, T9 pathological fracture and severe AKI: -Likely secondary to multiple myeloma -Reviewed CT chest, abdomen/pelvis result.  MRI thoracic spine is pending.  Patient received IV fluid and Dilaudid in the ED. -Admit patient on the floor.  TSH: WNL, COVID-19 and PTH: Pending. -EDP consulted neurosurgery and oncology -Oncology Dr. Alen Blew- recommended to check UPEP and SPEP-to confirm multiple myeloma and oncology will get involved at that point.  He does not think that patient needs to be transferred to [facility] Antiemetic, pain control Nephrology consulted, input was appreciated.  Concerning for myeloma.  Will need to treat aggressively with isotonic fluids.  We will give calcitonin plus Zometa Lidocaine patch for rib pain  AKI: Likely in the setting of underlying multiple myeloma -Continue IV fluids.  Avoid nephrotoxic medications Nephrology's input was appreciated.  No indication for RRT currently UPEP and other labs to evaluate for myeloma pending  Hyponatremia: Sodium of 125. -Pt is asymptomatic.  Was started on IV antibiotics.  Per nephrology likely in the event of pseudo from paraproteinemia. We will check urine and serum osmolality, TSH, cortisol Continue  IV fluids   Leukocytosis: Likely reactive Afebrile, no indication for antibiotics as there is no infection evident   Constipation: Likely secondary to hypercalcemia due to underlying possible multiple myeloma -MiraLAX and Colace as needed for constipation -Continue IV fluids.   DVT prophylaxis: Heparin Code Status: Full Family Communication: Wife at bedside Disposition Plan: Home when medically stable Status is: Inpatient  Remains inpatient appropriate because:Ongoing diagnostic testing needed not appropriate for outpatient work up   Dispo: The patient is from: Home              Anticipated d/c is to: Home              Anticipated d/c date is: > 3 days              Patient currently is not medically stable to d/c.            LOS: 1 day   Time spent: 35 min with >50%  on coc    Nolberto Hanlon, MD [facility] Pager [phone]  If 7PM-7AM, please contact night-coverage [email or web address] Password [email or web address] 02/15/2020, 1:35 PM

## 2020-02-16 LAB — BASIC METABOLIC PANEL
Anion gap: 12 (ref 5–15)
BUN: 76 mg/dL — ABNORMAL HIGH (ref 6–20)
CO2: 23 mmol/L (ref 22–32)
Calcium: 13.5 mg/dL (ref 8.9–10.3)
Chloride: 93 mmol/L — ABNORMAL LOW (ref 98–111)
Creatinine, Ser: 4.53 mg/dL — ABNORMAL HIGH (ref 0.61–1.24)
GFR calc Af Amer: 16 mL/min — ABNORMAL LOW (ref >60)
GFR calc non Af Amer: 13 mL/min — ABNORMAL LOW (ref >60)
Glucose, Bld: 116 mg/dL — ABNORMAL HIGH (ref 70–99)
Potassium: 4.5 mmol/L (ref 3.5–5.1)
Sodium: 128 mmol/L — ABNORMAL LOW (ref 135–145)

## 2020-02-16 LAB — PROTEIN ELECTROPHORESIS, SERUM
A/G Ratio: 1 (ref 0.7–1.7)
Albumin ELP: 2.9 g/dL (ref 2.9–4.4)
Alpha-1-Globulin: 0.2 g/dL (ref 0.0–0.4)
Alpha-2-Globulin: 0.7 g/dL (ref 0.4–1.0)
Beta Globulin: 0.9 g/dL (ref 0.7–1.3)
Gamma Globulin: 1.1 g/dL (ref 0.4–1.8)
Globulin, Total: 2.9 g/dL (ref 2.2–3.9)
M-Spike, %: 0.9 g/dL — ABNORMAL HIGH
Total Protein ELP: 5.8 g/dL — ABNORMAL LOW (ref 6.0–8.5)

## 2020-02-16 LAB — CBC
HCT: 33 % — ABNORMAL LOW (ref 39.0–52.0)
Hemoglobin: 11.5 g/dL — ABNORMAL LOW (ref 13.0–17.0)
MCH: 29.5 pg (ref 26.0–34.0)
MCHC: 34.8 g/dL (ref 30.0–36.0)
MCV: 84.6 fL (ref 80.0–100.0)
Platelets: 173 K/uL (ref 150–400)
RBC: 3.9 MIL/uL — ABNORMAL LOW (ref 4.22–5.81)
RDW: 11.2 % — ABNORMAL LOW (ref 11.5–15.5)
WBC: 10.9 K/uL — ABNORMAL HIGH (ref 4.0–10.5)
nRBC: 0 % (ref 0.0–0.2)

## 2020-02-16 MED ORDER — SODIUM CHLORIDE 0.9 % IV SOLN
30.0000 mg | Freq: Once | INTRAVENOUS | Status: AC
  Administered 2020-02-16: 30 mg via INTRAVENOUS
  Filled 2020-02-16: qty 10

## 2020-02-16 NOTE — Progress Notes (Addendum)
PROGRESS NOTE    Trevor Shelton  EHM:[id number] DOB: [birth year] DOA: 02/14/2020 PCP: Patient, No Pcp Per    Brief Narrative:  Trevor Shelton is a 54 y.o. male with no significant past medical history presents to emergency department with generalized weakness, left rib pain, lower back pain, constipation since 1 monthPatient does not speak [place].  Patient's son at the bedside at the historian.  He tells me that patient fell off of a ladder from a height of around 4 feet 1 month ago and landed on his left side and since then he started having back pain, left rib pain, and weakness in bilateral lower extremities which is getting worse. Marland KitchenUpon arrival to ED: Patient's vital signs are stable, CBC shows WBC of 13.3, UA negative for infection, CMP shows sodium of 125, severe AKI, calcium more than 15, lipase, TSH: WNL, COVID-19 and PTH: Pending.  CT abdomen pelvis shows diffuse lytic lesions throughout the skeleton consistent with multiple myeloma or metastatic disease.  Pathologic fractures at T5 and T9, acute fracture of left rib 9 and 10.   EDP consulted neurosurgery and oncology.  Oncology recommended to check SPEP and UPEP.  [facility] consulted for admission for hypercalcemia/AKI?  Multiple myeloma.    Consultants:   Nephrology, neurosurgery, oncology  Procedures:   Antimicrobials:       Subjective: No new complaints.  Denies any shortness of breath.  Feels a little lightheaded at times.  Is urinating and had bowel movement yesterday no abdominal pain nausea  Objective: Vitals:   02/16/20 0125 02/16/20 0542 02/16/20 0542 02/16/20 1344  BP:  140/75 140/75 (!) 152/83  Pulse:  73 73 74  Resp: '18 16 15 16  ' Temp:  98 F (36.7 C) 98 F (36.7 C) 98 F (36.7 C)  TempSrc:  Oral Oral Oral  SpO2:  99% 99% 96%  Weight:      Height:        Intake/Output Summary (Last 24 hours) at 02/16/2020 1620 Last data filed at 02/16/2020 1300 Gross per 24 hour  Intake 4697.65 ml  Output 900 ml    Net 3797.65 ml   Filed Weights   02/14/20 2203  Weight: 72.6 kg    Examination:  General exam: Calm and comfortable, Respiratory system: Clear to auscultation. Respiratory effort normal.  No wheeze rales rhonchi Cardiovascular system: S1 & S2 heard, RRR.  No M/RC/G.  Gastrointestinal system: Soft, NT/ND positive bowel sounds Central nervous system: Alert and oriented x3 grossly intact Extremities: No edema no cyanosis Skin: Warm and dry Psychiatry:  Mood & affect appropriate in current setting.     Data Reviewed: I have personally reviewed following labs and imaging studies  CBC: Recent Labs  Lab 02/14/20 1252 02/15/20 0202 02/16/20 0028  WBC 13.3* 11.6* 10.9*  HGB 14.2 12.8* 11.5*  HCT 41.2 37.0* 33.0*  MCV 85.1 83.7 84.6  PLT 212 174 366   Basic Metabolic Panel: Recent Labs  Lab 02/14/20 1252 02/14/20 1602 02/15/20 0202 02/16/20 0028  NA 125*  --  130* 128*  K 4.4  --  4.9 4.5  CL 82*  --  89* 93*  CO2 29  --  32 23  GLUCOSE 158*  --  116* 116*  BUN 84*  --  81* 76*  CREATININE 4.73*  --  4.92* 4.53*  CALCIUM >15.0* 15.8* >15.0* 13.5*  MG  --   --  1.8  --   PHOS  --   --  6.3*  --  GFR: Estimated Creatinine Clearance: 16.3 mL/min (A) (by C-G formula based on SCr of 4.53 mg/dL (H)). Liver Function Tests: Recent Labs  Lab 02/14/20 1252 02/15/20 0202  AST 34 28  ALT 42 41  ALKPHOS 66 58  BILITOT 1.3* 0.7  PROT 6.8 6.2*  ALBUMIN 3.3* 2.9*   Recent Labs  Lab 02/14/20 1252  LIPASE 28   No results for input(s): AMMONIA in the last 168 hours. Coagulation Profile: Recent Labs  Lab 02/15/20 1150  INR 1.1   Cardiac Enzymes: No results for input(s): CKTOTAL, CKMB, CKMBINDEX, TROPONINI in the last 168 hours. BNP (last 3 results) No results for input(s): PROBNP in the last 8760 hours. HbA1C: No results for input(s): HGBA1C in the last 72 hours. CBG: No results for input(s): GLUCAP in the last 168 hours. Lipid Profile: No results for  input(s): CHOL, HDL, LDLCALC, TRIG, CHOLHDL, LDLDIRECT in the last 72 hours. Thyroid Function Tests: Recent Labs    02/15/20 1852  TSH 0.405   Anemia Panel: No results for input(s): VITAMINB12, FOLATE, FERRITIN, TIBC, IRON, RETICCTPCT in the last 72 hours. Sepsis Labs: No results for input(s): PROCALCITON, LATICACIDVEN in the last 168 hours.  Recent Results (from the past 240 hour(s))  SARS Coronavirus 2 by RT PCR (hospital order, performed in [facility] hospital lab) Nasopharyngeal Nasopharyngeal Swab     Status: None   Collection Time: 02/14/20  4:05 PM   Specimen: Nasopharyngeal Swab  Result Value Ref Range Status   SARS Coronavirus 2 NEGATIVE NEGATIVE Final    Comment: (NOTE) SARS-CoV-2 target nucleic acids are NOT DETECTED.  The SARS-CoV-2 RNA is generally detectable in upper and lower respiratory specimens during the acute phase of infection. The lowest concentration of SARS-CoV-2 viral copies this assay can detect is 250 copies / mL. A negative result does not preclude SARS-CoV-2 infection and should not be used as the sole basis for treatment or other patient management decisions.  A negative result may occur with improper specimen collection / handling, submission of specimen other than nasopharyngeal swab, presence of viral mutation(s) within the areas targeted by this assay, and inadequate number of viral copies (<250 copies / mL). A negative result must be combined with clinical observations, patient history, and epidemiological information.  Fact Sheet for Patients:   StrictlyIdeas.no  Fact Sheet for Healthcare Providers: BankingDealers.co.za  This test is not yet approved or  cleared by the [place] FDA and has been authorized for detection and/or diagnosis of SARS-CoV-2 by FDA under an Emergency Use Authorization (EUA).  This EUA will remain in effect (meaning this test can be used) for the duration of  the COVID-19 declaration under Section 564(b)(1) of the Act, 21 U.S.C. section 360bbb-3(b)(1), unless the authorization is terminated or revoked sooner.  Performed at [facility], [place] [address] [place], [place] [address]          Radiology Studies: MR THORACIC SPINE W WO CONTRAST  Result Date: 02/14/2020 CLINICAL DATA:  Back pain. Compression fracture. Fever with nausea and vomiting. EXAM: MRI THORACIC WITHOUT AND WITH CONTRAST TECHNIQUE: Multiplanar and multiecho pulse sequences of the thoracic spine were obtained without and with intravenous contrast. CONTRAST:  42m GADAVIST GADOBUTROL 1 MMOL/ML IV SOLN COMPARISON:  None. FINDINGS: MRI THORACIC SPINE FINDINGS Alignment:  Normal Vertebrae: There are numerous bone marrow lesions throughout all visualized levels, but worst at T5, T6 and T9. There is compression deformity of T6 with 50% height loss and 5 mm of retropulsion. There is compression  deformity of T9 with approximately 10% height loss and 3 mm of retropulsion. Cord: There is mild hyperintense T2-weighted signal in the spinal cord at T5. Paraspinal and other soft tissues: There is soft tissue mass is associated with the vertebral lesions at T6 and T9. Otherwise, the paraspinal soft tissues are unremarkable. Disc levels: T5-6: Contrast-enhancing mass replaces most of the vertebral body bone marrow and extends into the pedicles, transverse processes and the right paraspinal tissues. There is also epidural extension that severely narrows the thecal sac and causes mass effect on the spinal cord. The mass also severely narrows the right T5 neural foramen. T8-9: Soft tissue mass replacing much of the bone marrow at T9 with epidural extension mildly narrowing the thecal sac with slight mass effect on the spinal cord. The mass extends into the right neural foramen. IMPRESSION: 1. Numerous bone marrow lesions throughout all visualized levels, but worst at T5, T6 and T9. 2. Epidural tumor  extension at T6 and T9. The T6 lesion severely narrows the thecal sac and deforms the spinal cord. 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 3. Pathologic fractures of T6 and T9 with 3-5 mm of retropulsion. Electronically Signed   By: Ulyses Jarred M.D.   On: 02/14/2020 19:14        Scheduled Meds: . calcitonin  4 Units/kg Subcutaneous BID  . docusate sodium  100 mg Oral Daily  . heparin injection (subcutaneous)  5,000 Units Subcutaneous Q8H  . lidocaine  1 patch Transdermal Q24H  . polyethylene glycol  17 g Oral Daily  . sodium chloride flush  3 mL Intravenous Once   Continuous Infusions: . sodium chloride 150 mL/hr at 02/16/20 1008    Assessment & Plan:   Principal Problem:   Hypercalcemia Active Problems:   AKI (acute kidney injury) ([place])   Pathologic fracture   Hyponatremia   Leukocytosis   Hypercalcemia with T6, T9 pathological fracture and severe AKI: -Likely secondary to multiple myeloma -Reviewed CT chest, abdomen/pelvis result.  MRI thoracic spine is pending.  Patient received IV fluid and Dilaudid in the ED. -Admit patient on the floor.  TSH: WNL, COVID-19 and PTH: Pending. -EDP consulted neurosurgery and oncology -Oncology Dr. Alen Blew- recommended to check UPEP and SPEP-to confirm multiple myeloma and oncology will get involved at that point.   Antiemetic, pain control Nephrology following- concern for myeloma.   Continue with aggressive IV fluid for hydration with isotonic fluids  Started on calcitonin  Given renal function nephrology will give pamidronate  PTH is low consistent which rules out primary hyperparathyroid; appropriate response for level of hypercalcemia. Lidocaine patch for rib pain  AKI: Likely in the setting of underlying multiple myeloma [place] prerenal azotemia +/- ATN Nephrology following Avoid nephrotoxic medications No indication for RRT Continue IV fluids for  hydration, strict I's and O's, daily weight, close monitoring urinary output and respiratory status Check bladder scan to make sure patient is not retaining with all aggressive IV patient   Hyponatremia: Sodium of 125 on admission. Today sodium 128 -Pt is asymptomatic.  Was started on IVF. Per nephrology likely in the event of pseudo from paraproteinemia. We will check urine and serum osmolality, TSH, cortisol Serum osmolality is high likely representing the paraprotein Nephrology follwoing   Leukocytosis: Likely reactive.  Improving slowly Afebrile, no indication for antibiotics as there is no evidence of infection  Continue to monitor   Constipation: Likely secondary to hypercalcemia due to underlying possible  multiple myeloma Bowel regimen -Continue IV fluids. Mobility as tolerated   DVT prophylaxis: Heparin Code Status: Full Family Communication: Wife at bedside Disposition Plan: CIR Status is: Inpatient  Remains inpatient appropriate because:Ongoing diagnostic testing needed not appropriate for outpatient work up   Dispo: The patient is from: Home              Anticipated d/c is to: CIR              Anticipated d/c date is: > 3 days              Patient currently is not medically stable to d/c.  Placement for CIR.  Ongoing diagnostic testing and treatment.            LOS: 2 days   Time spent: 35 min with >50% on coc    Nolberto Hanlon, MD [facility] Pager [phone]  If 7PM-7AM, please contact night-coverage [email or web address] Password [email or web address] 02/16/2020, 4:20 PM

## 2020-02-16 NOTE — Progress Notes (Signed)
Pt and his wife have some questions for MD. Will need Spanish interpreter during the discussion. Spero Geralds is reachable on [phone]

## 2020-02-16 NOTE — Plan of Care (Signed)
  Problem: Education: Goal: Knowledge of General Education information will improve Description Including pain rating scale, medication(s)/side effects and non-pharmacologic comfort measures Outcome: Progressing   

## 2020-02-16 NOTE — Progress Notes (Signed)
[facility] Progress Note   54 y.o. male HTN p/w  generalized weakness, left rib and lower back pain + constipation for a mth after  sustaining a fall from a ladder a mth ago. Pain is worse with deep inspiration and also has noted decreased urine output as well but denies obstructive like symptoms or incontinence. Noted on on CT to have diffuse lytic lesions throughout the skeleton and pathologic thoracic fractures. He has been using Naproxen daily for the past 15 days.  Assessment/ Plan:   1. 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 Awaiting lab tests to confirm; no absolute indication for RRT at this time and no e/o obstruction on CT A/P.  - Hydrating aggressively at this time with strict I&O's, daily weights and close monitoring of UOP and respirator status. - Check bladder scan to make sure he's not retaining with all the aggressive IV hydration. 2. Hypercalcemia - concerning for myeloma and at this time will hydrate aggressively with isotonic fluids. - Will also give Calcitonin; given renal function will give pamidronate.  - PTH is low consistent which rules out primary hyperparathyroid; appropriate response for level of hypercalcemia. 3. HTN - resume home regimen. 4. Hyponatremia - may be an element of pseudo from the paraproteinemia. Will continue to monitor for now and will also check a urine and serum osmolality, TSH, cortisol.  - Serum osmolality is high likely representing the paraprotein. 5. Constipation from prerenal as well as the hypercalcemia. He also has impingement but 4-5/5 strength in the legs.  Subjective:   Reflux but denies n/v. Poor appetite, denies obstructive symptoms or dyspnea or cough.   Objective:   BP 140/75 (BP Location: Left Arm)   Pulse 73   Temp 98 F (36.7 C) (Oral)   Resp 16   Ht _0  (1.6 m)   Wt 72.6 kg   SpO2 99%   BMI 28.34 kg/m   Intake/Output Summary (Last 24 hours) at  02/16/2020 0759 Last data filed at 02/16/2020 0546 Gross per 24 hour  Intake 7820.65 ml  Output --  Net 7820.65 ml   Weight change:   Physical Exam: GEN: NAD, A&Ox3, NCAT HEENT: Conjunctival pallor, EOMI NECK: Supple, no thyromegaly LUNGS: CTA B/L no rales, rhonchi or wheezing CV: RRR, No M/R/G ABD: SNDNT +BS  EXT: No lower extremity edema    Imaging: CT ABDOMEN PELVIS WO CONTRAST  Result Date: 02/14/2020 CLINICAL DATA:  Abdominal trauma. Trauma 1 month ago. Hypercalcemia. Fell 1 month ago. Abdominal pain, LOWER back pain, vomiting, diarrhea since the fall. EXAM: CT CHEST, ABDOMEN AND PELVIS WITHOUT CONTRAST TECHNIQUE: Multidetector CT imaging of the chest, abdomen and pelvis was performed following the standard protocol without IV contrast. COMPARISON:  None. FINDINGS: CT CHEST FINDINGS Cardiovascular: Heart is normal in size. Minimal atherosclerotic calcification of the coronary vessels. There is focal calcification of the ascending thoracic aorta, not associated with aneurysm. Normal noncontrast appearance of the pulmonary arteries. Mediastinum/Nodes: The visualized portion of the thyroid gland has a normal appearance. No significant mediastinal, hilar, or axillary adenopathy. Lungs/Pleura: There is subsegmental atelectasis at the lung bases. No suspicious pulmonary nodules. There is focal pleural thickening adjacent to LEFT anterior second rib lesion. Musculoskeletal: Diffuse lytic lesions are identified throughout the skeleton. Lesions vary in size from 2 millimeters to 1.8 centimeters. There is near complete replacement of the T5 vertebral body and loss of vertebral body height by approximately 30%. A soft tissue component extends to  the RIGHT of the vertebral body and extends into the canal, impinging on the spinal cord, image 25 of series 3. Soft tissue mass in this region is 4.2 x 3.2 centimeters. At T9, there is near complete replacement of the vertebral body with soft tissue component  to the RIGHT of the vertebral body. Loss of vertebral body height is approximately 30%. Suspect impingement of the cord at this level. Image 38 of series 3. Large lytic lesion is identified within the T5 vertebral body, measuring 1.8 centimeters. This lesion extends to the RIGHT cortex 2. Acute fracture of LEFT ribs 9 and 10, possibly pathologic. There is a soft tissue component associated with lesions in the LEFT second rib. Soft tissue component measures 1.1 x 1.8 centimeters. CT ABDOMEN PELVIS FINDINGS Hepatobiliary: No focal liver abnormality is seen. No gallstones, gallbladder wall thickening, or biliary dilatation. Pancreas: Unremarkable. No pancreatic ductal dilatation or surrounding inflammatory changes. Spleen: Normal in size without focal abnormality. Adrenals/Urinary Tract: Adrenal glands are unremarkable. Kidneys are normal, without renal calculi, focal lesion, or hydronephrosis. Bladder is unremarkable. Stomach/Bowel: Stomach and small bowel loops are unremarkable. The appendix is well seen and has a normal appearance. Loops of colon are unremarkable. Average stool burden. Vascular/Lymphatic: No significant vascular findings are present. No enlarged abdominal or pelvic lymph nodes. Reproductive: Prostate is mildly enlarged. There are prostatic calcifications. Other: Fat containing RIGHT inguinal hernia.  No ascites. Musculoskeletal: Diffuse lytic lesions throughout the pelvis, proximal femurs, and lumbar spine. IMPRESSION: 1. Diffuse lytic lesions throughout the skeleton, consistent with multiple myeloma or metastatic disease. Multiple myeloma is favored. 2. Pathologic fractures at T5 and T9. These levels show significant soft tissue mass, and impingement upon the spinal cord. Recommend further evaluation with MRI of the thoracic spine with and without contrast. 3. Acute fractures of LEFT ribs 9 and 10, possibly pathologic. 4. No evidence for acute injury of the abdomen or pelvis. 5. Fat containing  RIGHT inguinal hernia. 6. Mildly enlarged prostate. 7. Aortic Atherosclerosis (ICD10-I70.0). These results were called by telephone at the time of interpretation on 02/14/2020 at 4:06 pm to provider [facility] , who verbally acknowledged these results. Electronically Signed   By: Nolon Nations M.D.   On: 02/14/2020 16:07   CT Chest Wo Contrast  Result Date: 02/14/2020 CLINICAL DATA:  Abdominal trauma. Trauma 1 month ago. Hypercalcemia. Fell 1 month ago. Abdominal pain, LOWER back pain, vomiting, diarrhea since the fall. EXAM: CT CHEST, ABDOMEN AND PELVIS WITHOUT CONTRAST TECHNIQUE: Multidetector CT imaging of the chest, abdomen and pelvis was performed following the standard protocol without IV contrast. COMPARISON:  None. FINDINGS: CT CHEST FINDINGS Cardiovascular: Heart is normal in size. Minimal atherosclerotic calcification of the coronary vessels. There is focal calcification of the ascending thoracic aorta, not associated with aneurysm. Normal noncontrast appearance of the pulmonary arteries. Mediastinum/Nodes: The visualized portion of the thyroid gland has a normal appearance. No significant mediastinal, hilar, or axillary adenopathy. Lungs/Pleura: There is subsegmental atelectasis at the lung bases. No suspicious pulmonary nodules. There is focal pleural thickening adjacent to LEFT anterior second rib lesion. Musculoskeletal: Diffuse lytic lesions are identified throughout the skeleton. Lesions vary in size from 2 millimeters to 1.8 centimeters. There is near complete replacement of the T5 vertebral body and loss of vertebral body height by approximately 30%. A soft tissue component extends to the RIGHT of the vertebral body and extends into the canal, impinging on the spinal cord, image 25 of series 3. Soft tissue mass in this region is  4.2 x 3.2 centimeters. At T9, there is near complete replacement of the vertebral body with soft tissue component to the RIGHT of the vertebral body. Loss of vertebral  body height is approximately 30%. Suspect impingement of the cord at this level. Image 38 of series 3. Large lytic lesion is identified within the T5 vertebral body, measuring 1.8 centimeters. This lesion extends to the RIGHT cortex 2. Acute fracture of LEFT ribs 9 and 10, possibly pathologic. There is a soft tissue component associated with lesions in the LEFT second rib. Soft tissue component measures 1.1 x 1.8 centimeters. CT ABDOMEN PELVIS FINDINGS Hepatobiliary: No focal liver abnormality is seen. No gallstones, gallbladder wall thickening, or biliary dilatation. Pancreas: Unremarkable. No pancreatic ductal dilatation or surrounding inflammatory changes. Spleen: Normal in size without focal abnormality. Adrenals/Urinary Tract: Adrenal glands are unremarkable. Kidneys are normal, without renal calculi, focal lesion, or hydronephrosis. Bladder is unremarkable. Stomach/Bowel: Stomach and small bowel loops are unremarkable. The appendix is well seen and has a normal appearance. Loops of colon are unremarkable. Average stool burden. Vascular/Lymphatic: No significant vascular findings are present. No enlarged abdominal or pelvic lymph nodes. Reproductive: Prostate is mildly enlarged. There are prostatic calcifications. Other: Fat containing RIGHT inguinal hernia.  No ascites. Musculoskeletal: Diffuse lytic lesions throughout the pelvis, proximal femurs, and lumbar spine. IMPRESSION: 1. Diffuse lytic lesions throughout the skeleton, consistent with multiple myeloma or metastatic disease. Multiple myeloma is favored. 2. Pathologic fractures at T5 and T9. These levels show significant soft tissue mass, and impingement upon the spinal cord. Recommend further evaluation with MRI of the thoracic spine with and without contrast. 3. Acute fractures of LEFT ribs 9 and 10, possibly pathologic. 4. No evidence for acute injury of the abdomen or pelvis. 5. Fat containing RIGHT inguinal hernia. 6. Mildly enlarged prostate. 7.  Aortic Atherosclerosis (ICD10-I70.0). These results were called by telephone at the time of interpretation on 02/14/2020 at 4:06 pm to provider [facility] , who verbally acknowledged these results. Electronically Signed   By: Nolon Nations M.D.   On: 02/14/2020 16:07   MR THORACIC SPINE W WO CONTRAST  Result Date: 02/14/2020 CLINICAL DATA:  Back pain. Compression fracture. Fever with nausea and vomiting. EXAM: MRI THORACIC WITHOUT AND WITH CONTRAST TECHNIQUE: Multiplanar and multiecho pulse sequences of the thoracic spine were obtained without and with intravenous contrast. CONTRAST:  37m GADAVIST GADOBUTROL 1 MMOL/ML IV SOLN COMPARISON:  None. FINDINGS: MRI THORACIC SPINE FINDINGS Alignment:  Normal Vertebrae: There are numerous bone marrow lesions throughout all visualized levels, but worst at T5, T6 and T9. There is compression deformity of T6 with 50% height loss and 5 mm of retropulsion. There is compression deformity of T9 with approximately 10% height loss and 3 mm of retropulsion. Cord: There is mild hyperintense T2-weighted signal in the spinal cord at T5. Paraspinal and other soft tissues: There is soft tissue mass is associated with the vertebral lesions at T6 and T9. Otherwise, the paraspinal soft tissues are unremarkable. Disc levels: T5-6: Contrast-enhancing mass replaces most of the vertebral body bone marrow and extends into the pedicles, transverse processes and the right paraspinal tissues. There is also epidural extension that severely narrows the thecal sac and causes mass effect on the spinal cord. The mass also severely narrows the right T5 neural foramen. T8-9: Soft tissue mass replacing much of the bone marrow at T9 with epidural extension mildly narrowing the thecal sac with slight mass effect on the spinal cord. The mass extends into the right  neural foramen. IMPRESSION: 1. Numerous bone marrow lesions throughout all visualized levels, but worst at T5, T6 and T9. 2. Epidural tumor  extension at T6 and T9. The T6 lesion severely narrows the thecal sac and deforms the spinal cord. 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 3. Pathologic fractures of T6 and T9 with 3-5 mm of retropulsion. Electronically Signed   By: Ulyses Jarred M.D.   On: 02/14/2020 19:14    Labs: BMET Recent Labs  Lab 02/14/20 1252 02/14/20 1602 02/15/20 0202 02/16/20 0028  NA 125*  --  130* 128*  K 4.4  --  4.9 4.5  CL 82*  --  89* 93*  CO2 29  --  32 23  GLUCOSE 158*  --  116* 116*  BUN 84*  --  81* 76*  CREATININE 4.73*  --  4.92* 4.53*  CALCIUM >15.0* 15.8* >15.0* 13.5*  PHOS  --   --  6.3*  --    CBC Recent Labs  Lab 02/14/20 1252 02/15/20 0202 02/16/20 0028  WBC 13.3* 11.6* 10.9*  HGB 14.2 12.8* 11.5*  HCT 41.2 37.0* 33.0*  MCV 85.1 83.7 84.6  PLT 212 174 173    Medications:    . calcitonin  4 Units/kg Subcutaneous BID  . docusate sodium  100 mg Oral Daily  . heparin injection (subcutaneous)  5,000 Units Subcutaneous Q8H  . lidocaine  1 patch Transdermal Q24H  . polyethylene glycol  17 g Oral Daily  . sodium chloride flush  3 mL Intravenous Once      Otelia Santee, MD 02/16/2020, 7:59 AM

## 2020-02-16 NOTE — Progress Notes (Signed)
Pt bladder scanned with 298cc noted on scanner

## 2020-02-16 NOTE — Evaluation (Signed)
Occupational Therapy Evaluation Patient Details Name: Trevor Shelton MRN: [id number] DOB: November 04, 1965 Today's Date: 02/16/2020    History of Present Illness Patient is a 54 y/o male who presents with generalized weakness, rib pain, back pain. Admitted with hypercalcemia and AKI. Abdominal CT- lytic lesions throughout skeleton consistent with multiple myeloma or metastatic disease. Pathological fxs T5, T9 and acute rib fxs left 9-10th. No PMH.   Clinical Impression   PT admitted with hypercalcemia and AKI with pending CT results to patient. Pt currently with functional limitiations due to the deficits listed below (see OT problem list). Pt currently asking questions regarding current diagnosis and reason for admission. Pt does not understand full medical events except fractures to bones in back and ribs. Pt encouraged to have interpreter present with MD and ask more questions. Pt tolerated basic transfer this session with onset of "chills" throughout body with transfer. Pt returned to supine and RN providing pain medications. Medications were offered to patient prior to session and deferred it to wait the full 10 minutes until they were do.  Pt will benefit from skilled OT to increase their independence and safety with adls and balance to allow discharge CIR. Recommend palliative care to consult in addition to education regarding CT results.     Follow Up Recommendations  CIR;Other (comment) (Palliative services)    Equipment Recommendations  Other (comment);3 in 1 bedside commode (RW)    Recommendations for Other Services Rehab consult;Other (comment) (Palliative services)     Precautions / Restrictions Precautions Precautions: Fall      Mobility Bed Mobility Overal bed mobility: Needs Assistance Bed Mobility: Rolling;Supine to Sit;Sit to Supine Rolling: Min assist Sidelying to sit: Mod assist Supine to sit: Mod assist Sit to supine: Mod assist   General bed mobility comments: pt  educated on log rolling to keep back safe. pt requires (A) To elevate trunk from surface. pt static sitting minguard to Min (A) level   Transfers Overall transfer level: Needs assistance Equipment used: Rolling walker (2 wheeled) Transfers: Sit to/from Stand Sit to Stand: Min assist         General transfer comment: pt with elevated surface and able to power up. pt reporting knees feel weak    Balance Overall balance assessment: Needs assistance Sitting-balance support: Bilateral upper extremity supported;Feet supported Sitting balance-Leahy Scale: Fair     Standing balance support: Bilateral upper extremity supported;During functional activity Standing balance-Leahy Scale: Poor Standing balance comment: reliant on RW and OT (A)                           ADL either performed or assessed with clinical judgement   ADL Overall ADL's : Needs assistance/impaired Eating/Feeding: Minimal assistance;Bed level                   Lower Body Dressing: Maximal assistance Lower Body Dressing Details (indicate cue type and reason): pt able to initiate figure 4 crossing but unabel to reach for LB dressing. Pt L LE is weaker than R LE Toilet Transfer: Moderate assistance Toilet Transfer Details (indicate cue type and reason): simulated sit<>stand from bed bracing against bed surface           General ADL Comments: pt completed sit<>Stand and then started to have a full body like tremor in response. pt report "that is my chills. it will go away"      Vision Baseline Vision/History: No visual deficits  Perception     Praxis      Pertinent Vitals/Pain Pain Assessment: Faces Faces Pain Scale: Hurts even more Pain Location: back, ribs Pain Descriptors / Indicators: Sore Pain Intervention(s): Monitored during session;Repositioned;Patient requesting pain meds-RN notified     Hand Dominance Right   Extremity/Trunk Assessment Upper Extremity Assessment Upper  Extremity Assessment: Generalized weakness   Lower Extremity Assessment Lower Extremity Assessment: Generalized weakness   Cervical / Trunk Assessment Cervical / Trunk Assessment: Other exceptions Cervical / Trunk Exceptions: s/p spinal fxs   Communication Communication Communication: Interpreter utilized   Cognition Arousal/Alertness: Awake/alert Behavior During Therapy: WFL for tasks assessed/performed Overall Cognitive Status: Within Functional Limits for tasks assessed                                 General Comments: pt asking "why can i not walk when i coudl walk when i got here? why am i getting worse? Pt reports i have broken rib and back. " Pt does not understand full diagnoses and educated to speak with MD for more details   General Comments       Exercises     Shoulder Instructions      Home Living Family/patient expects to be discharged to:: Private residence Living Arrangements: Spouse/significant other;Children Available Help at Discharge: Family;Available PRN/intermittently Type of Home: House Home Access: Stairs to enter [employer] of Steps: 5 Entrance Stairs-Rails: Right Home Layout: One level     Bathroom Shower/Tub: Occupational psychologist: Standard     Home Equipment: None          Prior Functioning/Environment Level of Independence: Independent                 OT Problem List: Decreased strength;Decreased activity tolerance;Impaired balance (sitting and/or standing);Decreased safety awareness;Decreased knowledge of use of DME or AE;Decreased knowledge of precautions;Pain      OT Treatment/Interventions: Self-care/ADL training;Therapeutic exercise;Energy conservation;DME and/or AE instruction;Manual therapy;Modalities;Therapeutic activities;Patient/family education;Balance training;Neuromuscular education    OT Goals(Current goals can be found in the care plan section) Acute Rehab OT Goals Patient  Stated Goal: to find out what is causing this pain/weakness OT Goal Formulation: With patient Time For Goal Achievement: 03/01/20 Potential to Achieve Goals: Good  OT Frequency: Min 2X/week   Barriers to D/C:            Co-evaluation              AM-PAC OT "6 Clicks" Daily Activity     Outcome Measure Help from another person eating meals?: A Little Help from another person taking care of personal grooming?: A Little Help from another person toileting, which includes using toliet, bedpan, or urinal?: A Lot Help from another person bathing (including washing, rinsing, drying)?: A Lot Help from another person to put on and taking off regular upper body clothing?: A Little Help from another person to put on and taking off regular lower body clothing?: A Lot 6 Click Score: 15   End of Session Equipment Utilized During Treatment: Gait belt;Rolling walker Nurse Communication: Mobility status;Precautions  Activity Tolerance: Patient limited by pain Patient left: in bed;with call bell/phone within reach;with bed alarm set;with nursing/sitter in room (Rn giving pain medication)  OT Visit Diagnosis: Unsteadiness on feet (R26.81)                Time: 1749-4496 OT Time Calculation (min): 25 min Charges:  OT General Charges $  OT Visit: 1 Visit OT Evaluation $OT Eval Moderate Complexity: 1 Mod OT Treatments $Self Care/Home Management : 8-22 mins   Brynn, OTR/L  Acute Rehabilitation Services Pager: [phone] Office: [phone] .   Jeri Modena 02/16/2020, 4:01 PM

## 2020-02-16 NOTE — Progress Notes (Signed)
IP rehab admissions:  I met with patient and his wife.  They requested an interpreter because they speak Spanish.  I had Graciella from interpretive services assist me.  I discussed inpatient rehab admission.  Patient was not aware of the possible diagnosis of multiple myeloma.  I encouraged him to discuss diagnosis and treatment options with MD with use of an interpreter.  I gave patient and his wife a rehab brochure which is in [place].  I will await further therapies and treatment options before speaking again with patient about inpatient rehab services.  Call me for questions.  [phone]

## 2020-02-16 NOTE — Progress Notes (Addendum)
Paged on call hospitalist Dr. Sharlet Salina to inform of Critical Lab Calcium level of 13.5. No new orders from MD. Calcium is lower than previous; continue doing what we are doing and monitor. 02/16/20 @ 0157 Cyndi Bender, RN

## 2020-02-17 ENCOUNTER — Encounter (HOSPITAL_COMMUNITY): Payer: Self-pay | Admitting: Internal Medicine

## 2020-02-17 ENCOUNTER — Inpatient Hospital Stay (HOSPITAL_COMMUNITY): Payer: Self-pay

## 2020-02-17 ENCOUNTER — Ambulatory Visit
Admit: 2020-02-17 | Discharge: 2020-02-17 | Disposition: A | Discharge status: Home / Self Care | Payer: Self-pay | Attending: Radiation Oncology | Admitting: Radiation Oncology

## 2020-02-17 DIAGNOSIS — C9000 Multiple myeloma not having achieved remission: Secondary | ICD-10-CM

## 2020-02-17 DIAGNOSIS — R739 Hyperglycemia, unspecified: Secondary | ICD-10-CM

## 2020-02-17 DIAGNOSIS — M8458XA Pathological fracture in neoplastic disease, other specified site, initial encounter for fracture: Secondary | ICD-10-CM

## 2020-02-17 DIAGNOSIS — D72829 Elevated white blood cell count, unspecified: Secondary | ICD-10-CM

## 2020-02-17 DIAGNOSIS — E871 Hypo-osmolality and hyponatremia: Secondary | ICD-10-CM

## 2020-02-17 DIAGNOSIS — N179 Acute kidney failure, unspecified: Secondary | ICD-10-CM

## 2020-02-17 DIAGNOSIS — D649 Anemia, unspecified: Secondary | ICD-10-CM

## 2020-02-17 HISTORY — PX: IR US GUIDE VASC ACCESS RIGHT: IMG2390

## 2020-02-17 HISTORY — PX: IR FLUORO GUIDE CV LINE RIGHT: IMG2283

## 2020-02-17 LAB — RENAL FUNCTION PANEL
Albumin: 2.4 g/dL — ABNORMAL LOW (ref 3.5–5.0)
Anion gap: 10 (ref 5–15)
BUN: 65 mg/dL — ABNORMAL HIGH (ref 6–20)
CO2: 22 mmol/L (ref 22–32)
Calcium: 14 mg/dL (ref 8.9–10.3)
Chloride: 97 mmol/L — ABNORMAL LOW (ref 98–111)
Creatinine, Ser: 4.68 mg/dL — ABNORMAL HIGH (ref 0.61–1.24)
GFR calc Af Amer: 15 mL/min — ABNORMAL LOW (ref >60)
GFR calc non Af Amer: 13 mL/min — ABNORMAL LOW (ref >60)
Glucose, Bld: 95 mg/dL (ref 70–99)
Phosphorus: 5.3 mg/dL — ABNORMAL HIGH (ref 2.5–4.6)
Potassium: 4.2 mmol/L (ref 3.5–5.1)
Sodium: 129 mmol/L — ABNORMAL LOW (ref 135–145)

## 2020-02-17 LAB — BASIC METABOLIC PANEL
Anion gap: 8 (ref 5–15)
BUN: 67 mg/dL — ABNORMAL HIGH (ref 6–20)
CO2: 25 mmol/L (ref 22–32)
Calcium: 14.1 mg/dL (ref 8.9–10.3)
Chloride: 96 mmol/L — ABNORMAL LOW (ref 98–111)
Creatinine, Ser: 4.74 mg/dL — ABNORMAL HIGH (ref 0.61–1.24)
GFR calc Af Amer: 15 mL/min — ABNORMAL LOW (ref >60)
GFR calc non Af Amer: 13 mL/min — ABNORMAL LOW (ref >60)
Glucose, Bld: 123 mg/dL — ABNORMAL HIGH (ref 70–99)
Potassium: 4.6 mmol/L (ref 3.5–5.1)
Sodium: 129 mmol/L — ABNORMAL LOW (ref 135–145)

## 2020-02-17 LAB — CBC
HCT: 31.6 % — ABNORMAL LOW (ref 39.0–52.0)
HCT: 32.7 % — ABNORMAL LOW (ref 39.0–52.0)
Hemoglobin: 10.9 g/dL — ABNORMAL LOW (ref 13.0–17.0)
Hemoglobin: 11.3 g/dL — ABNORMAL LOW (ref 13.0–17.0)
MCH: 29.1 pg (ref 26.0–34.0)
MCH: 29.6 pg (ref 26.0–34.0)
MCHC: 34.5 g/dL (ref 30.0–36.0)
MCHC: 34.6 g/dL (ref 30.0–36.0)
MCV: 84.3 fL (ref 80.0–100.0)
MCV: 85.6 fL (ref 80.0–100.0)
Platelets: 181 10*3/uL (ref 150–400)
Platelets: 182 10*3/uL (ref 150–400)
RBC: 3.75 MIL/uL — ABNORMAL LOW (ref 4.22–5.81)
RBC: 3.82 MIL/uL — ABNORMAL LOW (ref 4.22–5.81)
RDW: 11 % — ABNORMAL LOW (ref 11.5–15.5)
RDW: 11.2 % — ABNORMAL LOW (ref 11.5–15.5)
WBC: 11.1 10*3/uL — ABNORMAL HIGH (ref 4.0–10.5)
WBC: 10.2 10*3/uL (ref 4.0–10.5)
nRBC: 0 % (ref 0.0–0.2)
nRBC: 0 % (ref 0.0–0.2)

## 2020-02-17 LAB — PHOSPHORUS: Phosphorus: 5.5 mg/dL — ABNORMAL HIGH (ref 2.5–4.6)

## 2020-02-17 LAB — HEPATITIS B SURFACE ANTIBODY,QUALITATIVE: Hep B S Ab: NONREACTIVE

## 2020-02-17 LAB — HEPATITIS B SURFACE ANTIGEN: Hepatitis B Surface Ag: NONREACTIVE

## 2020-02-17 LAB — HEPATITIS B CORE ANTIBODY, TOTAL: Hep B Core Total Ab: NONREACTIVE

## 2020-02-17 MED ORDER — FENTANYL CITRATE (PF) 100 MCG/2ML IJ SOLN
INTRAMUSCULAR | Status: AC | PRN
  Administered 2020-02-17: 50 ug via INTRAVENOUS

## 2020-02-17 MED ORDER — MIDAZOLAM HCL 2 MG/2ML IJ SOLN
INTRAMUSCULAR | Status: AC
  Filled 2020-02-17: qty 2

## 2020-02-17 MED ORDER — FENTANYL CITRATE (PF) 100 MCG/2ML IJ SOLN
INTRAMUSCULAR | Status: AC
  Filled 2020-02-17: qty 2

## 2020-02-17 MED ORDER — DEXAMETHASONE SODIUM PHOSPHATE 10 MG/ML IJ SOLN
6.0000 mg | Freq: Four times a day (QID) | INTRAMUSCULAR | Status: DC
  Administered 2020-02-18 – 2020-02-22 (×18): 6 mg via INTRAVENOUS
  Filled 2020-02-17 (×23): qty 0.6

## 2020-02-17 MED ORDER — LIDOCAINE HCL (PF) 1 % IJ SOLN: 5.0000 mL | INTRAMUSCULAR | Status: DC | PRN

## 2020-02-17 MED ORDER — MIDAZOLAM HCL 2 MG/2ML IJ SOLN
INTRAMUSCULAR | Status: AC | PRN
  Administered 2020-02-17: 1 mg via INTRAVENOUS

## 2020-02-17 MED ORDER — LIDOCAINE-PRILOCAINE 2.5-2.5 % EX CREA
1.0000 "application " | TOPICAL_CREAM | CUTANEOUS | Status: DC | PRN
  Filled 2020-02-17: qty 5

## 2020-02-17 MED ORDER — HEPARIN SODIUM (PORCINE) 1000 UNIT/ML DIALYSIS
1000.0000 [IU] | INTRAMUSCULAR | Status: DC | PRN
  Filled 2020-02-17 (×2): qty 1

## 2020-02-17 MED ORDER — CALCITONIN (SALMON) 200 UNIT/ML IJ SOLN
4.0000 [IU]/kg | Freq: Two times a day (BID) | INTRAMUSCULAR | Status: AC
  Administered 2020-02-17 (×2): 290 [IU] via SUBCUTANEOUS
  Filled 2020-02-17 (×3): qty 1.45

## 2020-02-17 MED ORDER — BISACODYL 10 MG RE SUPP
10.0000 mg | Freq: Every day | RECTAL | Status: DC | PRN
  Administered 2020-02-18: 10 mg via RECTAL
  Filled 2020-02-17: qty 1

## 2020-02-17 MED ORDER — HEPARIN SODIUM (PORCINE) 1000 UNIT/ML IJ SOLN
INTRAMUSCULAR | Status: AC
  Filled 2020-02-17: qty 1

## 2020-02-17 MED ORDER — SODIUM CHLORIDE 0.9 % IV SOLN: 100.0000 mL | INTRAVENOUS | Status: DC | PRN

## 2020-02-17 MED ORDER — POLYETHYLENE GLYCOL 3350 17 G PO PACK
17.0000 g | PACK | Freq: Two times a day (BID) | ORAL | Status: DC
  Administered 2020-02-17 – 2020-02-19 (×4): 17 g via ORAL
  Filled 2020-02-17 (×5): qty 1

## 2020-02-17 MED ORDER — CEFAZOLIN SODIUM-DEXTROSE 2-4 GM/100ML-% IV SOLN
2.0000 g | INTRAVENOUS | Status: AC
  Administered 2020-02-17: 2 g via INTRAVENOUS
  Filled 2020-02-17: qty 100

## 2020-02-17 MED ORDER — LIDOCAINE HCL (PF) 1 % IJ SOLN
INTRAMUSCULAR | Status: AC | PRN
  Administered 2020-02-17: 10 mL

## 2020-02-17 MED ORDER — PENTAFLUOROPROP-TETRAFLUOROETH EX AERO: 1.0000 "application " | INHALATION_SPRAY | CUTANEOUS | Status: DC | PRN

## 2020-02-17 MED ORDER — HEPARIN SODIUM (PORCINE) 5000 UNIT/ML IJ SOLN
5000.0000 [IU] | Freq: Three times a day (TID) | INTRAMUSCULAR | Status: DC
  Administered 2020-02-18: 5000 [IU] via SUBCUTANEOUS
  Filled 2020-02-17: qty 1

## 2020-02-17 MED ORDER — CHLORHEXIDINE GLUCONATE CLOTH 2 % EX PADS
6.0000 | MEDICATED_PAD | Freq: Every day | CUTANEOUS | Status: DC
  Administered 2020-02-17 – 2020-03-03 (×16): 6 via TOPICAL

## 2020-02-17 MED ORDER — ALTEPLASE 2 MG IJ SOLR: 2.0000 mg | Freq: Once | INTRAMUSCULAR | Status: DC | PRN

## 2020-02-17 MED ORDER — CHLORHEXIDINE GLUCONATE 4 % EX LIQD
CUTANEOUS | Status: AC
  Filled 2020-02-17: qty 15

## 2020-02-17 MED ORDER — SENNOSIDES-DOCUSATE SODIUM 8.6-50 MG PO TABS
1.0000 | ORAL_TABLET | Freq: Two times a day (BID) | ORAL | Status: DC
  Administered 2020-02-17 – 2020-02-20 (×6): 1 via ORAL
  Filled 2020-02-17 (×6): qty 1

## 2020-02-17 MED ORDER — HYDROMORPHONE HCL 1 MG/ML IJ SOLN
INTRAMUSCULAR | Status: AC
  Filled 2020-02-17: qty 0.5

## 2020-02-17 MED ORDER — HEPARIN SODIUM (PORCINE) 1000 UNIT/ML IJ SOLN
INTRAMUSCULAR | Status: AC
  Administered 2020-02-17: 3200 [IU] via INTRAVENOUS_CENTRAL
  Filled 2020-02-17: qty 4

## 2020-02-17 MED ORDER — LIDOCAINE HCL 1 % IJ SOLN
INTRAMUSCULAR | Status: AC
  Filled 2020-02-17: qty 20

## 2020-02-17 MED ORDER — HYDRALAZINE HCL 20 MG/ML IJ SOLN
10.0000 mg | Freq: Four times a day (QID) | INTRAMUSCULAR | Status: DC | PRN
  Administered 2020-02-24 – 2020-02-25 (×2): 10 mg via INTRAVENOUS
  Filled 2020-02-17 (×4): qty 1

## 2020-02-17 NOTE — Progress Notes (Signed)
PROGRESS NOTE    Trevor Shelton  ZYS:[id number] DOB: 04-Sep-1965 DOA: 02/14/2020 PCP: Patient, No Pcp Per   Brief Narrative:  The patient is a 54 year old Spanish-speaking male with no real past medical history who presented to the ED with generalized weakness, left rib pain, lower back pain and constipation for a month.  Patient son was at bedside during admission and told most of the story.  Patient fell off a ladder from a height of about 4 feet about a month ago and landed on his left side and since then he started having back pain, left rib pain and weakness in bilateral extremities which is subsequently getting worse.  He presented to the ED and had a work-up and was found to have an elevated WBC of 13.3.  Is also noted to have sodium 125, severe AKI, calcium more than 15,.  CT of the abdomen pelvis showed diffuse lytic lesions throughout the skeleton consistent with multiple myeloma or metastatic disease.  He also was noted to have pathological fractures at T5 and T9, and acute fracture left rib 9 and 10, the EDP consulted neurosurgery and oncology.  Oncology was recommended to check SPEP and UPEP and tried hospitalist will admit this patient for hypercalcemia and AKI with suspected multiple myeloma.  Neurosurgery spoke with the EDP and then felt from a neurosurgical standpoint in the setting of multiple myeloma, there are few surgical indications and management was typically radiation.  They felt that they could explore the possibility of kyphoplasty.  At the time of admission medical oncology was consulted and they felt that patient's diagnosis of multiple myeloma was not confirmed and they felt that he did not need to be transferred to [facility].  Medical oncology recommended SPEP and UPEP to confirm diagnosis of multiple myeloma.  Patient does have an M Spike and Medical Oncology was consulted for further evaluation and recommendations.   Assessment & Plan:   Principal Problem:    Hypercalcemia Active Problems:   AKI (acute kidney injury) ([place])   Pathologic fracture   Hyponatremia   Leukocytosis  Hypercalcemia withT6, T9pathological fracture and severeAKI; concern for Multiple Myeloma -Likely secondary to multiple myeloma -Reviewed CT chest, abdomen/pelvis result.  -MRI thoracic spine showed "Numerous bone marrow lesions throughout all visualized levels, but worst at T5, T6 and T9. 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 The T6 lesion severely narrows the thecal sac and deforms the spinal cord. 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 Pathologic fractures of T6 and T9 with 3-5 mm of retropulsion." ;  Patient continues to have some good strength in his legs but complains of them hurting. -Neuro-surgery evaluated initially and from a neurosurgical standpoint they do not feel that he is a surgical indication and they feel the management is typically radiation.  Dr. Marcello Moores did advise that if the patient is a good candidate for kyphoplasty this could be explored. Will discuss the case again with Dr. Marcello Moores -Patient received IV fluid and Dilaudid in the ED. -Admitted patient on the floor.  -EDP consulted neurosurgery and oncology -OncologyDr. Shadad-recommended to check UPEP and SPEP-to confirm multiple myeloma and oncology will get involved at that point. Patient has an M Spike of 0.9 Total Protein of 5.8, and Kappa/Lambda Light chains are pending  -C/w Antiemetic, pain control -Nephrology following- concern for myeloma and he is to undergo dialysis today given uremic symptoms.   -Continue with aggressive IV fluid for hydration with isotonic  fluids  -Started on calcitonin and getting 290 units sq BID x2 Doses  -Given renal function nephrology will give pamidronate 30 mg IV x1 -PTH is low consistent which rules out primary hyperparathyroid; appropriate response for level of hypercalcemia. -Lidocaine  patch for rib pain -Ca2+ went from >15.0 -> 13.5 -> 14.1 -Medical Oncology consulted for further evaluation and recommends  AKI -Likely in the setting of underlying multiple myeloma prerenal azotemia +/- ATN -Nephrology following and feels likely in the setting of multiple myeloma -Avoid nephrotoxic medications -Patient having Uremic Symptoms so Nephrology will initiate HD -Continue IV fluids for hydration (Getting NS at 150 mL/hr), strict I's and O's, daily weight, close monitoring urinary output and respiratory status -Nephrology feels that TPE may be true given that he has had symptoms for over a month and oncology does not feel it is likely necessary at this point given that his protein is not that high. -Check bladder scan to make sure patient is not retaining with all aggressive IV patient  Hyponatremia -Sodium of 125 on admission. -Today sodium 129 -Was started on IVF. Per nephrology likely in the event of pseudo from paraproteinemia. -We will check urine and serum osmolality, TSH, cortisol -Serum osmolality is high likely representing the paraprotein per Nephrology recc's  -Nephrology following and appreciate assistance   Leukocytosis -Likely reactive.  Improving slowly and now trended up -WBC went from 13.3 -> 11.6 -> 10.9 -> 11.1 -Afebrile, no indication for antibiotics as there is no evidence of infection  -Continue to monitor and repeat CBC in the AM  Constipation -Likely secondary to hypercalcemia due to underlying possible multiple myeloma -Bowel regimen was adjusted today  -Continue IV fluids. -Mobility as tolerated  Normocytic Anemia -Hgb/Hct went from 14.2/41.2 -> 10.9/31.6 -? Dilutional Drop -Check Anemia Panel in the AM; Continue to Monitor for S/Sx of Bleeding -Currently no overt bleeding noted -Repeat CBC in the AM  Hyperglycemia -Check HbA1c in the AM -Patient's Blood Sugars have been ranging from 116-158 on daily BMP/CMP's -Continue to Monitor  closely and if necessary will place on Sensitive Novolog SSI AC  DVT prophylaxis:  Code Status: FULL CODE  Family Communication: Discussed with family present at bedside  Disposition Plan: Pending further evaluation by Medical Oncology and clearance by Nephrology  Status is: Inpatient  Remains inpatient appropriate because:Ongoing diagnostic testing needed not appropriate for outpatient work up, Unsafe d/c plan, IV treatments appropriate due to intensity of illness or inability to take PO and Inpatient level of care appropriate due to severity of illness   Dispo: The patient is from: Home              Anticipated d/c is to: TBD              Anticipated d/c date is: 2 days              Patient currently is not medically stable to d/c.  Consultants:   Nephrology  Medical Oncology   IR  Procedures: [facility] placement with HD today  Antimicrobials:  Anti-infectives (From admission, onward)   Start     Dose/Rate Route Frequency Ordered Stop   02/17/20 0930  ceFAZolin (ANCEF) IVPB 2g/100 mL premix        2 g 200 mL/hr over 30 Minutes Intravenous To Radiology 02/17/20 0851 02/17/20 1034     Subjective: Seen and examined at bedside with the interpreter surgical [phone] and patient states that he is doing okay but complains of some leg pain and  back pain. He had a lot of questions about his diagnosis and wanted to speak with the oncologist. He understands and he will be going for dialysis later today. No nausea or vomiting right now but he has vomited all night. No other concerns or claims at this time and just feels weak.  Objective: Vitals:   02/17/20 0234 02/17/20 0459 02/17/20 0500 02/17/20 1302  BP:  (!) 155/81  (!) 159/85  Pulse:  77  77  Resp: '17 15  18  ' Temp:  98.3 F (36.8 C)  97.7 F (36.5 C)  TempSrc:  Oral  Oral  SpO2:  93%  94%  Weight:   72.8 kg   Height:        Intake/Output Summary (Last 24 hours) at 02/17/2020 1352 Last data filed at 02/17/2020 4235 Gross per  24 hour  Intake 60 ml  Output 1250 ml  Net -1190 ml   Filed Weights   02/14/20 2203 02/17/20 0500  Weight: 72.6 kg 72.8 kg   Examination: Physical Exam:  Constitutional: WN/WD overweight Spanish , NAD and appears calm and comfortable Eyes: PERRL, lids and conjunctivae normal, sclerae anicteric  ENMT: External Ears, Nose appear normal. Grossly normal hearing.  Neck: Appears normal, supple, no cervical masses, normal ROM, no appreciable thyromegaly; no JVD Respiratory: Diminished to auscultation bilaterally, no wheezing, rales, rhonchi or crackles. Normal respiratory effort and patient is not tachypenic. No accessory muscle use.  Cardiovascular: RRR, no murmurs / rubs / gallops. S1 and S2 auscultated. Abdomen: Soft, non-tender,distended 2/2 body habitus. Bowel sounds positive.  GU: Deferred. Musculoskeletal: No clubbing / cyanosis of digits/nails. No joint deformity upper and lower extremities.  Skin: No rashes, lesions, ulcers on a limited skin evaluation. No induration; Warm and dry.  Neurologic: CN 2-12 grossly intact with no focal deficits. Romberg sign and cerebellar reflexes not assessed.  Psychiatric: Normal judgment and insight. Alert and oriented x 3. Anxious mood and appropriate affect.   Data Reviewed: I have personally reviewed following labs and imaging studies  CBC: Recent Labs  Lab 02/14/20 1252 02/15/20 0202 02/16/20 0028 02/17/20 0102  WBC 13.3* 11.6* 10.9* 11.1*  HGB 14.2 12.8* 11.5* 10.9*  HCT 41.2 37.0* 33.0* 31.6*  MCV 85.1 83.7 84.6 84.3  PLT 212 174 173 361   Basic Metabolic Panel: Recent Labs  Lab 02/14/20 1252 02/14/20 1602 02/15/20 0202 02/16/20 0028 02/17/20 0102  NA 125*  --  130* 128* 129*  K 4.4  --  4.9 4.5 4.6  CL 82*  --  89* 93* 96*  CO2 29  --  32 23 25  GLUCOSE 158*  --  116* 116* 123*  BUN 84*  --  81* 76* 67*  CREATININE 4.73*  --  4.92* 4.53* 4.74*  CALCIUM >15.0* 15.8* >15.0* 13.5* 14.1*  MG  --   --  1.8  --   --   PHOS   --   --  6.3*  --  5.5*   GFR: Estimated Creatinine Clearance: 15.6 mL/min (A) (by C-G formula based on SCr of 4.74 mg/dL (H)). Liver Function Tests: Recent Labs  Lab 02/14/20 1252 02/15/20 0202  AST 34 28  ALT 42 41  ALKPHOS 66 58  BILITOT 1.3* 0.7  PROT 6.8 6.2*  ALBUMIN 3.3* 2.9*   Recent Labs  Lab 02/14/20 1252  LIPASE 28   No results for input(s): AMMONIA in the last 168 hours. Coagulation Profile: Recent Labs  Lab 02/15/20 1150  INR 1.1   Cardiac  Enzymes: No results for input(s): CKTOTAL, CKMB, CKMBINDEX, TROPONINI in the last 168 hours. BNP (last 3 results) No results for input(s): PROBNP in the last 8760 hours. HbA1C: No results for input(s): HGBA1C in the last 72 hours. CBG: No results for input(s): GLUCAP in the last 168 hours. Lipid Profile: No results for input(s): CHOL, HDL, LDLCALC, TRIG, CHOLHDL, LDLDIRECT in the last 72 hours. Thyroid Function Tests: Recent Labs    02/15/20 1852  TSH 0.405   Anemia Panel: No results for input(s): VITAMINB12, FOLATE, FERRITIN, TIBC, IRON, RETICCTPCT in the last 72 hours. Sepsis Labs: No results for input(s): PROCALCITON, LATICACIDVEN in the last 168 hours.  Recent Results (from the past 240 hour(s))  SARS Coronavirus 2 by RT PCR (hospital order, performed in [facility] hospital lab) Nasopharyngeal Nasopharyngeal Swab     Status: None   Collection Time: 02/14/20  4:05 PM   Specimen: Nasopharyngeal Swab  Result Value Ref Range Status   SARS Coronavirus 2 NEGATIVE NEGATIVE Final    Comment: (NOTE) SARS-CoV-2 target nucleic acids are NOT DETECTED.  The SARS-CoV-2 RNA is generally detectable in upper and lower respiratory specimens during the acute phase of infection. The lowest concentration of SARS-CoV-2 viral copies this assay can detect is 250 copies / mL. A negative result does not preclude SARS-CoV-2 infection and should not be used as the sole basis for treatment or other patient management decisions.   A negative result may occur with improper specimen collection / handling, submission of specimen other than nasopharyngeal swab, presence of viral mutation(s) within the areas targeted by this assay, and inadequate number of viral copies (<250 copies / mL). A negative result must be combined with clinical observations, patient history, and epidemiological information.  Fact Sheet for Patients:   StrictlyIdeas.no  Fact Sheet for Healthcare Providers: BankingDealers.co.za  This test is not yet approved or  cleared by the [place] FDA and has been authorized for detection and/or diagnosis of SARS-CoV-2 by FDA under an Emergency Use Authorization (EUA).  This EUA will remain in effect (meaning this test can be used) for the duration of the COVID-19 declaration under Section 564(b)(1) of the Act, 21 U.S.C. section 360bbb-3(b)(1), unless the authorization is terminated or revoked sooner.  Performed at [facility], [place] [address] [place], [place] [address]     RN Pressure Injury Documentation:     Estimated body mass index is 28.43 kg/m as calculated from the following:   Height as of this encounter: '5\' 3"'  (1.6 m).   Weight as of this encounter: 72.8 kg.  Malnutrition Type:      Malnutrition Characteristics:      Nutrition Interventions:      Radiology Studies: No results found.  Scheduled Meds: . calcitonin  4 Units/kg Subcutaneous BID  . Chlorhexidine Gluconate Cloth  6 each Topical Q0600  . docusate sodium  100 mg Oral Daily  . [START ON 02/18/2020] heparin injection (subcutaneous)  5,000 Units Subcutaneous Q8H  . lidocaine  1 patch Transdermal Q24H  . polyethylene glycol  17 g Oral BID  . senna-docusate  1 tablet Oral BID  . sodium chloride flush  3 mL Intravenous Once   Continuous Infusions: . sodium chloride 150 mL/hr at 02/17/20 0558    LOS: 3 days   Kerney Elbe, DO [facility] PAGER is on [place]  If 7PM-7AM, please contact night-coverage [email or web address]

## 2020-02-17 NOTE — Consult Note (Signed)
Radiation Oncology         [phone] ________________________________  Name: Trevor Shelton        MRN: [id number]  Date of Service: 02/17/20 DOB: 13-May-1966   REFERRING PHYSICIAN: Dr. Duffy Rhody  DIAGNOSIS: The primary encounter diagnosis was AKI (acute kidney injury) ([place]). Diagnoses of Hypercalcemia, Closed fracture of sixth thoracic vertebra, unspecified fracture morphology, initial encounter [facility]), Closed fracture of ninth thoracic vertebra, unspecified fracture morphology, initial encounter [facility]), Thoracic spine tumor, Closed fracture of multiple ribs of left side, initial encounter, Acute kidney injury ([place]), Multiple myeloma without remission ([place]), and Multiple myeloma without remission ([place]) were also pertinent to this visit.   HISTORY OF PRESENT ILLNESS: Trevor Shelton is a 54 y.o. male seen at the request of Dr. Marcello Moores for concerns for a new multiple myeloma diagnosis that is involving the thoracic spine and causing epidural extension. The patient had been having pain in his back and presented to the ED on 02/14/20 after having several weeks of back and abdominal pain. CT imaging revealed a diffuse lytic appearing disease process through the skeleton consistent with an malignant process. He has pathologic fractures at T5 and T9, and fractures of the left 9 th and 10 th ribs. He had an MRI of the thoracic spine showing numerous bone marrow lesions throughout all visualized levels but more noticeably worse between T5-T6, and at T9. Epidural tumor extension at T6-9 was noted and the site at T6 severely narrows the thecal sac and deforms the spinal cord. Given these findings, he was admitted for further work up. He was found to be hypercalcemic with acute kidney injury, and nephrology has been managing his hydration. The patient is currently in interventional radiology having a catheter placed for dialysis later today. Given the findings in the thoracic spine he was started on dexamethasone 6 mg q  6 hours.    PREVIOUS RADIATION THERAPY: No   PAST MEDICAL HISTORY:  Past Medical History:  Diagnosis Date  . AKI (acute kidney injury) ([place]) 02/2020  . Hypercalcemia 02/2020  . Hypercholesteremia   . Hypertension        PAST SURGICAL HISTORY: Past Surgical History:  Procedure Laterality Date  . HERNIA REPAIR       FAMILY HISTORY: History reviewed. No pertinent family history.   SOCIAL HISTORY:  reports that he has never smoked. He has never used smokeless tobacco. He reports previous alcohol use. He reports previous drug use. The patient is married and lives in [place]. He has older children and his youngest daughter is 4.   ALLERGIES: Patient has no known allergies.   MEDICATIONS:  Current Facility-Administered Medications  Medication Dose Route Frequency Provider Last Rate Last Admin  . 0.9 %  sodium chloride infusion   Intravenous Continuous Pahwani, Rinka R, MD 150 mL/hr at 02/17/20 0558 New Bag at 02/17/20 0558  . acetaminophen (TYLENOL) tablet 650 mg  650 mg Oral Q6H PRN Pahwani, Rinka R, MD       Or  . acetaminophen (TYLENOL) suppository 650 mg  650 mg Rectal Q6H PRN Pahwani, Rinka R, MD      . bisacodyl (DULCOLAX) suppository 10 mg  10 mg Rectal Daily PRN Raiford Noble Latif, DO      . calcitonin (MIACALCIN) injection 290 Units  4 Units/kg Subcutaneous BID Lang Snow, FNP   290 Units at 02/17/20 0457  . chlorhexidine (HIBICLENS) 4 % liquid           . Chlorhexidine Gluconate Cloth 2 %  PADS 6 each  6 each Topical Q0600 Dwana Melena, MD   6 each at 02/17/20 1506  . dexamethasone (DECADRON) injection 6 mg  6 mg Intravenous Q6H Sheikh, Omair Latif, DO      . docusate sodium (COLACE) capsule 100 mg  100 mg Oral Daily Pahwani, Rinka R, MD   100 mg at 02/17/20 1124  . fentaNYL (SUBLIMAZE) 100 MCG/2ML injection           . fentaNYL (SUBLIMAZE) injection   Intravenous PRN Aletta Edouard, MD   50 mcg at 02/17/20 1605  . [START ON 02/18/2020] heparin injection  5,000 Units  5,000 Units Subcutaneous Q8H Monia Sabal, PA-C      . heparin sodium (porcine) 1000 UNIT/ML injection           . HYDROmorphone (DILAUDID) injection 0.5-1 mg  0.5-1 mg Intravenous Q2H PRN Pahwani, Rinka R, MD   1 mg at 02/17/20 1124  . lidocaine (LIDODERM) 5 % 1 patch  1 patch Transdermal Q24H Nolberto Hanlon, MD   1 patch at 02/16/20 1428  . lidocaine (PF) (XYLOCAINE) 1 % injection    PRN Aletta Edouard, MD   10 mL at 02/17/20 1610  . lidocaine (XYLOCAINE) 1 % (with pres) injection           . midazolam (VERSED) 2 MG/2ML injection           . midazolam (VERSED) injection   Intravenous PRN Aletta Edouard, MD   1 mg at 02/17/20 1604  . ondansetron (ZOFRAN) tablet 4 mg  4 mg Oral Q6H PRN Pahwani, Rinka R, MD   4 mg at 02/15/20 1616   Or  . ondansetron (ZOFRAN) injection 4 mg  4 mg Intravenous Q6H PRN Pahwani, Rinka R, MD   4 mg at 02/17/20 0020  . oxyCODONE (Oxy IR/ROXICODONE) immediate release tablet 5 mg  5 mg Oral Q4H PRN Pahwani, Rinka R, MD   5 mg at 02/16/20 2045  . polyethylene glycol (MIRALAX / GLYCOLAX) packet 17 g  17 g Oral BID Sheikh, Omair [place], DO      . senna-docusate (Senokot-S) tablet 1 tablet  1 tablet Oral BID Raiford Noble [place], DO   1 tablet at 02/17/20 1127  . sodium chloride flush (NS) 0.9 % injection 3 mL  3 mL Intravenous Once Virgel Manifold, MD         REVIEW OF SYSTEMS: On review of systems the patient is having pain in the mid back, but not in the low back as well as the left chest wall. He has not been able to walk for a few days, but can move his feet. He notes left greater than right numbness in his feet. He denies any low back pain. His pain and the numbness in his feet has been the same in the last day or two. No other complaints are verbalized.     PHYSICAL EXAM:  Wt Readings from Last 3 Encounters:  02/17/20 160 lb 7.9 oz (72.8 kg)  02/14/20 155 lb (70.3 kg)   Temp Readings from Last 3 Encounters:  02/17/20 97.7 F (36.5 C) (Oral)    01/26/20 98 F (36.7 C) (Oral)   BP Readings from Last 3 Encounters:  02/17/20 (!) 154/85  01/26/20 (!) 144/71   Pulse Readings from Last 3 Encounters:  02/17/20 78  01/26/20 71   Pain Assessment Pain Score: 7 /10  Unable to assess due to encounter type.  ECOG = 4  0 - Asymptomatic (Fully  active, able to carry on all predisease activities without restriction)  1 - Symptomatic but completely ambulatory (Restricted in physically strenuous activity but ambulatory and able to carry out work of a light or sedentary nature. For example, light housework, office work)  2 - Symptomatic, <50% in bed during the day (Ambulatory and capable of all self care but unable to carry out any work activities. Up and about more than 50% of waking hours)  3 - Symptomatic, >50% in bed, but not bedbound (Capable of only limited self-care, confined to bed or chair 50% or more of waking hours)  4 - Bedbound (Completely disabled. Cannot carry on any self-care. Totally confined to bed or chair)  5 - Death   Eustace Pen MM, Creech RH, Tormey DC, et al. [phone]). "Toxicity and response criteria of the [facility] Group". [place] Oncol. 5 (6): 649-55    LABORATORY DATA:  Lab Results  Component Value Date   WBC 11.1 (H) 02/17/2020   HGB 10.9 (L) 02/17/2020   HCT 31.6 (L) 02/17/2020   MCV 84.3 02/17/2020   PLT 181 02/17/2020   Lab Results  Component Value Date   NA 129 (L) 02/17/2020   K 4.6 02/17/2020   CL 96 (L) 02/17/2020   CO2 25 02/17/2020   Lab Results  Component Value Date   ALT 41 02/15/2020   AST 28 02/15/2020   ALKPHOS 58 02/15/2020   BILITOT 0.7 02/15/2020      RADIOGRAPHY: CT ABDOMEN PELVIS WO CONTRAST  Result Date: 02/14/2020 CLINICAL DATA:  Abdominal trauma. Trauma 1 month ago. Hypercalcemia. Fell 1 month ago. Abdominal pain, LOWER back pain, vomiting, diarrhea since the fall. EXAM: CT CHEST, ABDOMEN AND PELVIS WITHOUT CONTRAST TECHNIQUE: Multidetector CT  imaging of the chest, abdomen and pelvis was performed following the standard protocol without IV contrast. COMPARISON:  None. FINDINGS: CT CHEST FINDINGS Cardiovascular: Heart is normal in size. Minimal atherosclerotic calcification of the coronary vessels. There is focal calcification of the ascending thoracic aorta, not associated with aneurysm. Normal noncontrast appearance of the pulmonary arteries. Mediastinum/Nodes: The visualized portion of the thyroid gland has a normal appearance. No significant mediastinal, hilar, or axillary adenopathy. Lungs/Pleura: There is subsegmental atelectasis at the lung bases. No suspicious pulmonary nodules. There is focal pleural thickening adjacent to LEFT anterior second rib lesion. Musculoskeletal: Diffuse lytic lesions are identified throughout the skeleton. Lesions vary in size from 2 millimeters to 1.8 centimeters. There is near complete replacement of the T5 vertebral body and loss of vertebral body height by approximately 30%. A soft tissue component extends to the RIGHT of the vertebral body and extends into the canal, impinging on the spinal cord, image 25 of series 3. Soft tissue mass in this region is 4.2 x 3.2 centimeters. At T9, there is near complete replacement of the vertebral body with soft tissue component to the RIGHT of the vertebral body. Loss of vertebral body height is approximately 30%. Suspect impingement of the cord at this level. Image 38 of series 3. Large lytic lesion is identified within the T5 vertebral body, measuring 1.8 centimeters. This lesion extends to the RIGHT cortex 2. Acute fracture of LEFT ribs 9 and 10, possibly pathologic. There is a soft tissue component associated with lesions in the LEFT second rib. Soft tissue component measures 1.1 x 1.8 centimeters. CT ABDOMEN PELVIS FINDINGS Hepatobiliary: No focal liver abnormality is seen. No gallstones, gallbladder wall thickening, or biliary dilatation. Pancreas: Unremarkable. No  pancreatic ductal dilatation or surrounding  inflammatory changes. Spleen: Normal in size without focal abnormality. Adrenals/Urinary Tract: Adrenal glands are unremarkable. Kidneys are normal, without renal calculi, focal lesion, or hydronephrosis. Bladder is unremarkable. Stomach/Bowel: Stomach and small bowel loops are unremarkable. The appendix is well seen and has a normal appearance. Loops of colon are unremarkable. Average stool burden. Vascular/Lymphatic: No significant vascular findings are present. No enlarged abdominal or pelvic lymph nodes. Reproductive: Prostate is mildly enlarged. There are prostatic calcifications. Other: Fat containing RIGHT inguinal hernia.  No ascites. Musculoskeletal: Diffuse lytic lesions throughout the pelvis, proximal femurs, and lumbar spine. IMPRESSION: 1. Diffuse lytic lesions throughout the skeleton, consistent with multiple myeloma or metastatic disease. Multiple myeloma is favored. 2. Pathologic fractures at T5 and T9. These levels show significant soft tissue mass, and impingement upon the spinal cord. Recommend further evaluation with MRI of the thoracic spine with and without contrast. 3. Acute fractures of LEFT ribs 9 and 10, possibly pathologic. 4. No evidence for acute injury of the abdomen or pelvis. 5. Fat containing RIGHT inguinal hernia. 6. Mildly enlarged prostate. 7. Aortic Atherosclerosis (ICD10-I70.0). These results were called by telephone at the time of interpretation on 02/14/2020 at 4:06 pm to provider [facility] , who verbally acknowledged these results. Electronically Signed   By: Nolon Nations M.D.   On: 02/14/2020 16:07   DG Ribs Bilateral W/Chest  Result Date: 01/26/2020 CLINICAL DATA:  Right rib pain after fall at work. EXAM: BILATERAL RIBS AND CHEST - 4+ VIEW COMPARISON:  None. FINDINGS: No fracture or other bone lesions are seen involving the ribs. There is no evidence of pneumothorax or pleural effusion. Both lungs are clear. Heart size and  mediastinal contours are within normal limits. IMPRESSION: Negative. Electronically Signed   By: Marijo Conception M.D.   On: 01/26/2020 13:20   DG Thoracic Spine 2 View  Result Date: 01/26/2020 CLINICAL DATA:  Back pain after fall at work. EXAM: THORACIC SPINE 2 VIEWS COMPARISON:  None. FINDINGS: There is no evidence of thoracic spine fracture. Alignment is normal. No other significant bone abnormalities are identified. IMPRESSION: Negative. Electronically Signed   By: Marijo Conception M.D.   On: 01/26/2020 13:21   DG Lumbar Spine Complete  Result Date: 01/26/2020 CLINICAL DATA:  Fall, back pain. Additional provided: Fall 3 weeks ago, sharp pain in posterior back on right side. EXAM: LUMBAR SPINE - COMPLETE 4+ VIEW COMPARISON:  No pertinent prior studies available for comparison. FINDINGS: For the purposes of this dictation, five lumbar vertebrae are assumed and the caudal most well-formed intervertebral disc is designated L5-S1. Rudimentary ribs at the T12 level. Mild L1-L2, L2-L3, L3-L4, L4-L5 grade 1 retrolisthesis. No lumbar compression deformity is demonstrated. Intervertebral disc height is maintained. Mild facet arthrosis greatest at L5-S1. Suspected fusion across the sacroiliac joints bilaterally IMPRESSION: No lumbar compression deformity is demonstrated. Mild L1-L2, L2-L3, L3-L4, L4-L5 grade 1 retrolisthesis. Mild lumbar spondylosis as outlined. Suspected fusion across the sacroiliac joints bilaterally. Electronically Signed   By: Kellie Simmering DO   On: 01/26/2020 10:15   CT Chest Wo Contrast  Result Date: 02/14/2020 CLINICAL DATA:  Abdominal trauma. Trauma 1 month ago. Hypercalcemia. Fell 1 month ago. Abdominal pain, LOWER back pain, vomiting, diarrhea since the fall. EXAM: CT CHEST, ABDOMEN AND PELVIS WITHOUT CONTRAST TECHNIQUE: Multidetector CT imaging of the chest, abdomen and pelvis was performed following the standard protocol without IV contrast. COMPARISON:  None. FINDINGS: CT CHEST  FINDINGS Cardiovascular: Heart is normal in size. Minimal atherosclerotic calcification of the  coronary vessels. There is focal calcification of the ascending thoracic aorta, not associated with aneurysm. Normal noncontrast appearance of the pulmonary arteries. Mediastinum/Nodes: The visualized portion of the thyroid gland has a normal appearance. No significant mediastinal, hilar, or axillary adenopathy. Lungs/Pleura: There is subsegmental atelectasis at the lung bases. No suspicious pulmonary nodules. There is focal pleural thickening adjacent to LEFT anterior second rib lesion. Musculoskeletal: Diffuse lytic lesions are identified throughout the skeleton. Lesions vary in size from 2 millimeters to 1.8 centimeters. There is near complete replacement of the T5 vertebral body and loss of vertebral body height by approximately 30%. A soft tissue component extends to the RIGHT of the vertebral body and extends into the canal, impinging on the spinal cord, image 25 of series 3. Soft tissue mass in this region is 4.2 x 3.2 centimeters. At T9, there is near complete replacement of the vertebral body with soft tissue component to the RIGHT of the vertebral body. Loss of vertebral body height is approximately 30%. Suspect impingement of the cord at this level. Image 38 of series 3. Large lytic lesion is identified within the T5 vertebral body, measuring 1.8 centimeters. This lesion extends to the RIGHT cortex 2. Acute fracture of LEFT ribs 9 and 10, possibly pathologic. There is a soft tissue component associated with lesions in the LEFT second rib. Soft tissue component measures 1.1 x 1.8 centimeters. CT ABDOMEN PELVIS FINDINGS Hepatobiliary: No focal liver abnormality is seen. No gallstones, gallbladder wall thickening, or biliary dilatation. Pancreas: Unremarkable. No pancreatic ductal dilatation or surrounding inflammatory changes. Spleen: Normal in size without focal abnormality. Adrenals/Urinary Tract: Adrenal glands  are unremarkable. Kidneys are normal, without renal calculi, focal lesion, or hydronephrosis. Bladder is unremarkable. Stomach/Bowel: Stomach and small bowel loops are unremarkable. The appendix is well seen and has a normal appearance. Loops of colon are unremarkable. Average stool burden. Vascular/Lymphatic: No significant vascular findings are present. No enlarged abdominal or pelvic lymph nodes. Reproductive: Prostate is mildly enlarged. There are prostatic calcifications. Other: Fat containing RIGHT inguinal hernia.  No ascites. Musculoskeletal: Diffuse lytic lesions throughout the pelvis, proximal femurs, and lumbar spine. IMPRESSION: 1. Diffuse lytic lesions throughout the skeleton, consistent with multiple myeloma or metastatic disease. Multiple myeloma is favored. 2. Pathologic fractures at T5 and T9. These levels show significant soft tissue mass, and impingement upon the spinal cord. Recommend further evaluation with MRI of the thoracic spine with and without contrast. 3. Acute fractures of LEFT ribs 9 and 10, possibly pathologic. 4. No evidence for acute injury of the abdomen or pelvis. 5. Fat containing RIGHT inguinal hernia. 6. Mildly enlarged prostate. 7. Aortic Atherosclerosis (ICD10-I70.0). These results were called by telephone at the time of interpretation on 02/14/2020 at 4:06 pm to provider [facility] , who verbally acknowledged these results. Electronically Signed   By: Nolon Nations M.D.   On: 02/14/2020 16:07   MR THORACIC SPINE W WO CONTRAST  Result Date: 02/14/2020 CLINICAL DATA:  Back pain. Compression fracture. Fever with nausea and vomiting. EXAM: MRI THORACIC WITHOUT AND WITH CONTRAST TECHNIQUE: Multiplanar and multiecho pulse sequences of the thoracic spine were obtained without and with intravenous contrast. CONTRAST:  8m GADAVIST GADOBUTROL 1 MMOL/ML IV SOLN COMPARISON:  None. FINDINGS: MRI THORACIC SPINE FINDINGS Alignment:  Normal Vertebrae: There are numerous bone marrow  lesions throughout all visualized levels, but worst at T5, T6 and T9. There is compression deformity of T6 with 50% height loss and 5 mm of retropulsion. There is compression deformity of T9  with approximately 10% height loss and 3 mm of retropulsion. Cord: There is mild hyperintense T2-weighted signal in the spinal cord at T5. Paraspinal and other soft tissues: There is soft tissue mass is associated with the vertebral lesions at T6 and T9. Otherwise, the paraspinal soft tissues are unremarkable. Disc levels: T5-6: Contrast-enhancing mass replaces most of the vertebral body bone marrow and extends into the pedicles, transverse processes and the right paraspinal tissues. There is also epidural extension that severely narrows the thecal sac and causes mass effect on the spinal cord. The mass also severely narrows the right T5 neural foramen. T8-9: Soft tissue mass replacing much of the bone marrow at T9 with epidural extension mildly narrowing the thecal sac with slight mass effect on the spinal cord. The mass extends into the right neural foramen. IMPRESSION: 1. Numerous bone marrow lesions throughout all visualized levels, but worst at T5, T6 and T9. 2. 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 The T6 lesion severely narrows the thecal sac and deforms the spinal cord. 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 3. Pathologic fractures of T6 and T9 with 3-5 mm of retropulsion. Electronically Signed   By: Ulyses Jarred M.D.   On: 02/14/2020 19:14       IMPRESSION/PLAN: 1. Probable Multiple Multiple Myeloma not having achieved remission with impending cord compression and painful metastatic lesions in the spine and ribs. I spoke with the patient and his wife by phone today to review with the pacific interpretor line the findings and work up thus far. Dr. Lisbeth Renshaw has reviewed his case and has recommended urgent radiation. I spoke with the patient and his  wife regarding the rationale to offer a palliative course of radiotherapy to the thoracic spine from T5-9 and to treat the left 9th and 10th rib. We discussed the sense of urgency about starting treatment to hopefully prevent permanent paralysis. He has just been started on Dexamethasone today and hopefully will have improvement in his neurologic symptoms with this as we proceed with radiotherapy. We discussed the risks, benefits, short, and long term effects of radiotherapy, and the patient is interested in proceeding. I discussed the delivery and logistics of radiotherapy and anticipates a course of 10 fractions of radiotherapy.  He will come for simulation tomorrow at which time he will sign consent for the spine and ribs. He is in agreement with this plan.  2. AKI with hypercalcemia. The patient continues under the care of nephrology and we will follow this expectantly as he proceeds with dialysis today. We will have to coordinate his radiation around dialysis schedule as well.  3. Needs for transportation. I will reach out to the cancer center social workers so that they can be aware of him and help with coordination of multiple resources including transportation for this patient when he is able to be discharged home. His wife does not drive and relies on her sister in law to take them places.   In a visit lasting 60 minutes, greater than 50% of the time was spent by phone and in floor time discussing the patient's condition, in preparation for the discussion, and coordinating the patient's care.     Carola Rhine, PAC

## 2020-02-17 NOTE — Progress Notes (Signed)
Spoke with the patient's nurse to let her know that he is scheduled for radiation planning and treatment tomorrow 02/18/2020. I asked that she call [facility] and have transportation set up to have the patient here at 9:00 am and 2:00 pm for treatments.  I asked that she obtain an order for the patient to be off of telemetry for these appointments and that he have pain medication on board.  She verbalized understanding and will work on getting [facility] set up.  Call back number [phone]) was given in the event she has further questions.  Will continue to follow as necessary.  Gloriajean Dell. Leonie Green, BSN

## 2020-02-17 NOTE — Consult Note (Signed)
Chief Complaint: Patient was seen in consultation today for tunneled dialysis catheter placement Chief Complaint  Patient presents with  . Weakness  . Abdominal Pain   at the request of Dr Corliss Marcus   Supervising Physician: Aletta Edouard  Patient Status: [facility] - In-pt  History of Present Illness: Trevor Shelton is a 53 y.o. male   Generalized weakness and back pain Constipation (using OTC pain meds for 2 weeks) Suffered fall from ladder 1 mo ago Decreased urine OP Creatinine rising Nephrology consulted   Dr Augustin Coupe note today: 1. 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 Awaiting lab tests to confirm; no absolute indication for RRT at this time and no e/o obstruction on CT A/P.  - Hydratingaggressively at this time with strict I&O's, daily weights and close monitoring of UOP and respiratory status.  - Will start RRT (hopefully temporarily) given uremic signs. - TPE may be too late given he has had symptoms for a mth but if oncology feels strongly about it will give trial. Will also check urine 24hr UPEP; if no light chains in urine then myeloma kidney less likely.   Scheduled now for tunneled dialysis catheter placement in IR per Dr Augustin Coupe request    History reviewed. No pertinent past medical history.  History reviewed. No pertinent surgical history.  Allergies: Patient has no known allergies.  Medications: Prior to Admission medications   Medication Sig Start Date End Date Taking? Authorizing Provider  lisinopril-hydrochlorothiazide (ZESTORETIC) 20-25 MG tablet Take 1 tablet by mouth daily. 02/01/20  Yes [provider]  simvastatin (ZOCOR) 20 MG tablet Take 20 mg by mouth daily. 02/01/20  Yes [provider]  HYDROcodone-acetaminophen (NORCO/VICODIN) 5-325 MG tablet Take 1 tablet by mouth every 4 (four) hours as needed. Patient not taking: Reported on 02/14/2020 01/26/20   Henderly, Britni A, PA-C  lidocaine  (LIDODERM) 5 % Place 1 patch onto the skin daily. Remove & Discard patch within 12 hours or as directed by MD Patient not taking: Reported on 02/14/2020 01/26/20   Henderly, Britni A, PA-C     History reviewed. No pertinent family history.  Social History   Socioeconomic History  . Marital status: Married    Spouse name: Not on file  . Number of children: Not on file  . Years of education: Not on file  . Highest education level: Not on file  Occupational History  . Not on file  Tobacco Use  . Smoking status: Never Smoker  . Smokeless tobacco: Never Used  Substance and Sexual Activity  . Alcohol use: Not Currently  . Drug use: Not Currently  . Sexual activity: Not Currently  Other Topics Concern  . Not on file  Social History Narrative  . Not on file   Social Determinants of Health   Financial Resource Strain:   . Difficulty of Paying Living Expenses:   Food Insecurity:   . Worried About Charity fundraiser in the Last Year:   . Arboriculturist in the Last Year:   Transportation Needs:   . Film/video editor (Medical):   Marland Kitchen Lack of Transportation (Non-Medical):   Physical Activity:   . Days of Exercise per Week:   . Minutes of Exercise per Session:   Stress:   . Feeling of Stress :   Social Connections:   . Frequency of Communication with Friends and Family:   . Frequency of Social Gatherings with Friends and Family:   .  Attends Religious Services:   . Active Member of Clubs or Organizations:   . Attends Archivist Meetings:   Marland Kitchen Marital Status:     Review of Systems: A 12 point ROS discussed and pertinent positives are indicated in the HPI above.  All other systems are negative.  Review of Systems  Constitutional: Positive for activity change, appetite change and fatigue. Negative for fever and unexpected weight change.  Respiratory: Negative for cough and shortness of breath.   Cardiovascular: Negative for chest pain.  Gastrointestinal: Positive for  nausea. Negative for abdominal pain.  Musculoskeletal: Positive for back pain.  Neurological: Positive for weakness.  Psychiatric/Behavioral: Negative for behavioral problems and confusion.    Vital Signs: BP (!) 155/81 (BP Location: Left Arm)   Pulse 77   Temp 98.3 F (36.8 C) (Oral)   Resp 15   Ht '5\' 3"'  (1.6 m)   Wt 160 lb 7.9 oz (72.8 kg)   SpO2 93%   BMI 28.43 kg/m   Physical Exam Vitals reviewed.  HENT:     Mouth/Throat:     Mouth: Mucous membranes are moist.  Cardiovascular:     Rate and Rhythm: Normal rate and regular rhythm.  Pulmonary:     Effort: Pulmonary effort is normal.     Breath sounds: Normal breath sounds.  Abdominal:     General: Bowel sounds are normal.     Tenderness: There is no abdominal tenderness.  Skin:    General: Skin is warm.  Neurological:     Mental Status: He is alert and oriented to person, place, and time.  Psychiatric:        Behavior: Behavior normal.     Comments: Spoke to pt through [employer] [phone]     Imaging: CT ABDOMEN PELVIS WO CONTRAST  Result Date: 02/14/2020 CLINICAL DATA:  Abdominal trauma. Trauma 1 month ago. Hypercalcemia. Fell 1 month ago. Abdominal pain, LOWER back pain, vomiting, diarrhea since the fall. EXAM: CT CHEST, ABDOMEN AND PELVIS WITHOUT CONTRAST TECHNIQUE: Multidetector CT imaging of the chest, abdomen and pelvis was performed following the standard protocol without IV contrast. COMPARISON:  None. FINDINGS: CT CHEST FINDINGS Cardiovascular: Heart is normal in size. Minimal atherosclerotic calcification of the coronary vessels. There is focal calcification of the ascending thoracic aorta, not associated with aneurysm. Normal noncontrast appearance of the pulmonary arteries. Mediastinum/Nodes: The visualized portion of the thyroid gland has a normal appearance. No significant mediastinal, hilar, or axillary adenopathy. Lungs/Pleura: There is subsegmental atelectasis at the  lung bases. No suspicious pulmonary nodules. There is focal pleural thickening adjacent to LEFT anterior second rib lesion. Musculoskeletal: Diffuse lytic lesions are identified throughout the skeleton. Lesions vary in size from 2 millimeters to 1.8 centimeters. There is near complete replacement of the T5 vertebral body and loss of vertebral body height by approximately 30%. A soft tissue component extends to the RIGHT of the vertebral body and extends into the canal, impinging on the spinal cord, image 25 of series 3. Soft tissue mass in this region is 4.2 x 3.2 centimeters. At T9, there is near complete replacement of the vertebral body with soft tissue component to the RIGHT of the vertebral body. Loss of vertebral body height is approximately 30%. Suspect impingement of the cord at this level. Image 38 of series 3. Large lytic lesion is identified within the T5 vertebral body, measuring 1.8 centimeters. This lesion extends to the RIGHT cortex 2. Acute fracture of LEFT ribs 9 and 10,  possibly pathologic. There is a soft tissue component associated with lesions in the LEFT second rib. Soft tissue component measures 1.1 x 1.8 centimeters. CT ABDOMEN PELVIS FINDINGS Hepatobiliary: No focal liver abnormality is seen. No gallstones, gallbladder wall thickening, or biliary dilatation. Pancreas: Unremarkable. No pancreatic ductal dilatation or surrounding inflammatory changes. Spleen: Normal in size without focal abnormality. Adrenals/Urinary Tract: Adrenal glands are unremarkable. Kidneys are normal, without renal calculi, focal lesion, or hydronephrosis. Bladder is unremarkable. Stomach/Bowel: Stomach and small bowel loops are unremarkable. The appendix is well seen and has a normal appearance. Loops of colon are unremarkable. Average stool burden. Vascular/Lymphatic: No significant vascular findings are present. No enlarged abdominal or pelvic lymph nodes. Reproductive: Prostate is mildly enlarged. There are  prostatic calcifications. Other: Fat containing RIGHT inguinal hernia.  No ascites. Musculoskeletal: Diffuse lytic lesions throughout the pelvis, proximal femurs, and lumbar spine. IMPRESSION: 1. Diffuse lytic lesions throughout the skeleton, consistent with multiple myeloma or metastatic disease. Multiple myeloma is favored. 2. Pathologic fractures at T5 and T9. These levels show significant soft tissue mass, and impingement upon the spinal cord. Recommend further evaluation with MRI of the thoracic spine with and without contrast. 3. Acute fractures of LEFT ribs 9 and 10, possibly pathologic. 4. No evidence for acute injury of the abdomen or pelvis. 5. Fat containing RIGHT inguinal hernia. 6. Mildly enlarged prostate. 7. Aortic Atherosclerosis (ICD10-I70.0). These results were called by telephone at the time of interpretation on 02/14/2020 at 4:06 pm to provider [facility] , who verbally acknowledged these results. Electronically Signed   By: Nolon Nations M.D.   On: 02/14/2020 16:07   DG Ribs Bilateral W/Chest  Result Date: 01/26/2020 CLINICAL DATA:  Right rib pain after fall at work. EXAM: BILATERAL RIBS AND CHEST - 4+ VIEW COMPARISON:  None. FINDINGS: No fracture or other bone lesions are seen involving the ribs. There is no evidence of pneumothorax or pleural effusion. Both lungs are clear. Heart size and mediastinal contours are within normal limits. IMPRESSION: Negative. Electronically Signed   By: Marijo Conception M.D.   On: 01/26/2020 13:20   DG Thoracic Spine 2 View  Result Date: 01/26/2020 CLINICAL DATA:  Back pain after fall at work. EXAM: THORACIC SPINE 2 VIEWS COMPARISON:  None. FINDINGS: There is no evidence of thoracic spine fracture. Alignment is normal. No other significant bone abnormalities are identified. IMPRESSION: Negative. Electronically Signed   By: Marijo Conception M.D.   On: 01/26/2020 13:21   DG Lumbar Spine Complete  Result Date: 01/26/2020 CLINICAL DATA:  Fall, back pain.  Additional provided: Fall 3 weeks ago, sharp pain in posterior back on right side. EXAM: LUMBAR SPINE - COMPLETE 4+ VIEW COMPARISON:  No pertinent prior studies available for comparison. FINDINGS: For the purposes of this dictation, five lumbar vertebrae are assumed and the caudal most well-formed intervertebral disc is designated L5-S1. Rudimentary ribs at the T12 level. Mild L1-L2, L2-L3, L3-L4, L4-L5 grade 1 retrolisthesis. No lumbar compression deformity is demonstrated. Intervertebral disc height is maintained. Mild facet arthrosis greatest at L5-S1. Suspected fusion across the sacroiliac joints bilaterally IMPRESSION: No lumbar compression deformity is demonstrated. Mild L1-L2, L2-L3, L3-L4, L4-L5 grade 1 retrolisthesis. Mild lumbar spondylosis as outlined. Suspected fusion across the sacroiliac joints bilaterally. Electronically Signed   By: Kellie Simmering DO   On: 01/26/2020 10:15   CT Chest Wo Contrast  Result Date: 02/14/2020 CLINICAL DATA:  Abdominal trauma. Trauma 1 month ago. Hypercalcemia. Fell 1 month ago. Abdominal pain, LOWER back  pain, vomiting, diarrhea since the fall. EXAM: CT CHEST, ABDOMEN AND PELVIS WITHOUT CONTRAST TECHNIQUE: Multidetector CT imaging of the chest, abdomen and pelvis was performed following the standard protocol without IV contrast. COMPARISON:  None. FINDINGS: CT CHEST FINDINGS Cardiovascular: Heart is normal in size. Minimal atherosclerotic calcification of the coronary vessels. There is focal calcification of the ascending thoracic aorta, not associated with aneurysm. Normal noncontrast appearance of the pulmonary arteries. Mediastinum/Nodes: The visualized portion of the thyroid gland has a normal appearance. No significant mediastinal, hilar, or axillary adenopathy. Lungs/Pleura: There is subsegmental atelectasis at the lung bases. No suspicious pulmonary nodules. There is focal pleural thickening adjacent to LEFT anterior second rib lesion. Musculoskeletal: Diffuse  lytic lesions are identified throughout the skeleton. Lesions vary in size from 2 millimeters to 1.8 centimeters. There is near complete replacement of the T5 vertebral body and loss of vertebral body height by approximately 30%. A soft tissue component extends to the RIGHT of the vertebral body and extends into the canal, impinging on the spinal cord, image 25 of series 3. Soft tissue mass in this region is 4.2 x 3.2 centimeters. At T9, there is near complete replacement of the vertebral body with soft tissue component to the RIGHT of the vertebral body. Loss of vertebral body height is approximately 30%. Suspect impingement of the cord at this level. Image 38 of series 3. Large lytic lesion is identified within the T5 vertebral body, measuring 1.8 centimeters. This lesion extends to the RIGHT cortex 2. Acute fracture of LEFT ribs 9 and 10, possibly pathologic. There is a soft tissue component associated with lesions in the LEFT second rib. Soft tissue component measures 1.1 x 1.8 centimeters. CT ABDOMEN PELVIS FINDINGS Hepatobiliary: No focal liver abnormality is seen. No gallstones, gallbladder wall thickening, or biliary dilatation. Pancreas: Unremarkable. No pancreatic ductal dilatation or surrounding inflammatory changes. Spleen: Normal in size without focal abnormality. Adrenals/Urinary Tract: Adrenal glands are unremarkable. Kidneys are normal, without renal calculi, focal lesion, or hydronephrosis. Bladder is unremarkable. Stomach/Bowel: Stomach and small bowel loops are unremarkable. The appendix is well seen and has a normal appearance. Loops of colon are unremarkable. Average stool burden. Vascular/Lymphatic: No significant vascular findings are present. No enlarged abdominal or pelvic lymph nodes. Reproductive: Prostate is mildly enlarged. There are prostatic calcifications. Other: Fat containing RIGHT inguinal hernia.  No ascites. Musculoskeletal: Diffuse lytic lesions throughout the pelvis, proximal  femurs, and lumbar spine. IMPRESSION: 1. Diffuse lytic lesions throughout the skeleton, consistent with multiple myeloma or metastatic disease. Multiple myeloma is favored. 2. Pathologic fractures at T5 and T9. These levels show significant soft tissue mass, and impingement upon the spinal cord. Recommend further evaluation with MRI of the thoracic spine with and without contrast. 3. Acute fractures of LEFT ribs 9 and 10, possibly pathologic. 4. No evidence for acute injury of the abdomen or pelvis. 5. Fat containing RIGHT inguinal hernia. 6. Mildly enlarged prostate. 7. Aortic Atherosclerosis (ICD10-I70.0). These results were called by telephone at the time of interpretation on 02/14/2020 at 4:06 pm to provider [facility] , who verbally acknowledged these results. Electronically Signed   By: Nolon Nations M.D.   On: 02/14/2020 16:07   MR THORACIC SPINE W WO CONTRAST  Result Date: 02/14/2020 CLINICAL DATA:  Back pain. Compression fracture. Fever with nausea and vomiting. EXAM: MRI THORACIC WITHOUT AND WITH CONTRAST TECHNIQUE: Multiplanar and multiecho pulse sequences of the thoracic spine were obtained without and with intravenous contrast. CONTRAST:  34m GADAVIST GADOBUTROL 1 MMOL/ML IV  SOLN COMPARISON:  None. FINDINGS: MRI THORACIC SPINE FINDINGS Alignment:  Normal Vertebrae: There are numerous bone marrow lesions throughout all visualized levels, but worst at T5, T6 and T9. There is compression deformity of T6 with 50% height loss and 5 mm of retropulsion. There is compression deformity of T9 with approximately 10% height loss and 3 mm of retropulsion. Cord: There is mild hyperintense T2-weighted signal in the spinal cord at T5. Paraspinal and other soft tissues: There is soft tissue mass is associated with the vertebral lesions at T6 and T9. Otherwise, the paraspinal soft tissues are unremarkable. Disc levels: T5-6: Contrast-enhancing mass replaces most of the vertebral body bone marrow and extends into the  pedicles, transverse processes and the right paraspinal tissues. There is also epidural extension that severely narrows the thecal sac and causes mass effect on the spinal cord. The mass also severely narrows the right T5 neural foramen. T8-9: Soft tissue mass replacing much of the bone marrow at T9 with epidural extension mildly narrowing the thecal sac with slight mass effect on the spinal cord. The mass extends into the right neural foramen. IMPRESSION: 1. Numerous bone marrow lesions throughout all visualized levels, but worst at T5, T6 and T9. 2. 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 The T6 lesion severely narrows the thecal sac and deforms the spinal cord. 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 3. Pathologic fractures of T6 and T9 with 3-5 mm of retropulsion. Electronically Signed   By: Ulyses Jarred M.D.   On: 02/14/2020 19:14    Labs:  CBC: Recent Labs    02/14/20 1252 02/15/20 0202 02/16/20 0028 02/17/20 0102  WBC 13.3* 11.6* 10.9* 11.1*  HGB 14.2 12.8* 11.5* 10.9*  HCT 41.2 37.0* 33.0* 31.6*  PLT 212 174 173 181    COAGS: Recent Labs    02/15/20 1150  INR 1.1  APTT 20*    BMP: Recent Labs    02/14/20 1252 02/14/20 1602 02/15/20 0202 02/16/20 0028 02/17/20 0102  NA 125*  --  130* 128* 129*  K 4.4  --  4.9 4.5 4.6  CL 82*  --  89* 93* 96*  CO2 29  --  32 23 25  GLUCOSE 158*  --  116* 116* 123*  BUN 84*  --  81* 76* 67*  CALCIUM >15.0* 15.8* >15.0* 13.5* 14.1*  CREATININE 4.73*  --  4.92* 4.53* 4.74*  GFRNONAA 13*  --  12* 13* 13*  GFRAA 15*  --  14* 16* 15*    LIVER FUNCTION TESTS: Recent Labs    02/14/20 1252 02/15/20 0202  BILITOT 1.3* 0.7  AST 34 28  ALT 42 41  ALKPHOS 66 58  PROT 6.8 6.2*  ALBUMIN 3.3* 2.9*    TUMOR MARKERS: No results for input(s): AFPTM, CEA, CA199, CHROMGRNA in the last 8760 hours.  Assessment and Plan:  Worsening renal function Acute renal  failure To initiate dialysis per Nephrology Scheduled for tunneled dialysis catheter placement in IR today Risks and benefits discussed with the patient through [place] interpreter machine including, but not limited to bleeding, infection, vascular injury, pneumothorax which may require chest tube placement, air embolism or even death  All of the patient's questions were answered, patient is agreeable to proceed. Consent signed and in chart.   Thank you for this interesting consult.  I greatly enjoyed meeting Trevor Shelton and look forward to participating in their care.  A copy of this  report was sent to the requesting provider on this date.  Electronically Signed: Lavonia Drafts, PA-C 02/17/2020, 9:17 AM   I spent a total of 20 Minutes    in face to face in clinical consultation, greater than 50% of which was counseling/coordinating care for tunneled HD catheter placement

## 2020-02-17 NOTE — Progress Notes (Signed)
[facility] Progress Note   54 y.o.maleHTN p/w  generalized weakness, left rib and lower back pain + constipation for a mth after  sustaining a fall from a ladder a mth ago. Pain is worse with deep inspiration and also has noted decreased urine output as well but denies obstructive like symptoms or incontinence. Noted on on CT to have diffuse lytic lesions throughout the skeletonand pathologic thoracic fractures. He has been using Naproxen daily for the past 15 days.  Assessment/ Plan:   1. 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 Awaiting lab tests to confirm; no absolute indication for RRT at this time and no e/o obstruction on CT A/P.  - Hydrating aggressively at this time with strict I&O's, daily weights and close monitoring of UOP and respiratory status.  - Will start RRT (hopefully temporarily) given uremic signs. - TPE may be too late given he has had symptoms for a mth but if oncology feels strongly about it will give trial. Will also check urine 24hr UPEP; if no light chains in urine then myeloma kidney less likely.   - Check bladder scan to make sure he's not retaining with all the aggressive IV hydration. 2. Hypercalcemia - concerning for myeloma and at this time will hydrate aggressively with isotonic fluids. PTH is low consistent which rules out primary hyperparathyroid; appropriate response for level of hypercalcemia. - Started on Calcitonin; given compromised renal function  Given lower dose pamidronate.  - Oncology may want bone marrow biopsy 1st for definitive diagnosis but with M-spike and lytic lesions + renal failure appears to be myeloma.  3. HTN - resume home regimen. 4. Hyponatremia - may be an element of pseudo from the paraproteinemia. Will continue to monitor for now and will also check a urine and serum osmolality, TSH, cortisol.  - Serum osmolality is high likely representing the  paraprotein. 5. Constipation from prerenal as well as the hypercalcemia. He also has impingement but 4-5/5 strength in the legs.  Subjective:   N + V overnight. Poor appetite, denies obstructive symptoms or dyspnea.   Objective:   BP (!) 155/81 (BP Location: Left Arm)   Pulse 77   Temp 98.3 F (36.8 C) (Oral)   Resp 15   Ht '5\' 3"'  (1.6 m)   Wt 72.8 kg   SpO2 93%   BMI 28.43 kg/m   Intake/Output Summary (Last 24 hours) at 02/17/2020 0757 Last data filed at 02/17/2020 0724 Gross per 24 hour  Intake 540 ml  Output 1650 ml  Net -1110 ml   Weight change:   Physical Exam: GEN: NAD, A&Ox3, NCAT HEENT: Conjunctival pallor, EOMI NECK: Supple, no thyromegaly LUNGS: CTA B/L no rales, rhonchi or wheezing CV: RRR, No M/R/G ABD: SNDNT +BS  EXT: No lower extremity edema  Imaging: No results found.  Labs: BMET Recent Labs  Lab 02/14/20 1252 02/14/20 1602 02/15/20 0202 02/16/20 0028 02/17/20 0102  NA 125*  --  130* 128* 129*  K 4.4  --  4.9 4.5 4.6  CL 82*  --  89* 93* 96*  CO2 29  --  32 23 25  GLUCOSE 158*  --  116* 116* 123*  BUN 84*  --  81* 76* 67*  CREATININE 4.73*  --  4.92* 4.53* 4.74*  CALCIUM >15.0* 15.8* >15.0* 13.5* 14.1*  PHOS  --   --  6.3*  --  5.5*   CBC Recent Labs  Lab 02/14/20 1252 02/15/20 0202  02/16/20 0028 02/17/20 0102  WBC 13.3* 11.6* 10.9* 11.1*  HGB 14.2 12.8* 11.5* 10.9*  HCT 41.2 37.0* 33.0* 31.6*  MCV 85.1 83.7 84.6 84.3  PLT 212 174 173 181    Medications:    . calcitonin  4 Units/kg Subcutaneous BID  . docusate sodium  100 mg Oral Daily  . heparin injection (subcutaneous)  5,000 Units Subcutaneous Q8H  . lidocaine  1 patch Transdermal Q24H  . polyethylene glycol  17 g Oral Daily  . sodium chloride flush  3 mL Intravenous Once      Otelia Santee, MD 02/17/2020, 7:57 AM

## 2020-02-17 NOTE — Progress Notes (Signed)
Paged on call hospitalist Dr. Sharlet Salina to in form of critical Calcium Level of 14.1. Awaiting response. 02/17/2020 @ 0325 Cyndi Bender, RN

## 2020-02-17 NOTE — Procedures (Signed)
Interventional Radiology Procedure Note  Procedure: Tunneled HD catheter placement  Complications: None  Estimated Blood Loss: < 10 mL  Findings: Right IJ 19 cm tip to cuff length Palindrome catheter placed with tip in RA. OK to use.  Livan Hires T. Addysen Louth, M.D Pager:  [phone]   

## 2020-02-17 NOTE — Progress Notes (Addendum)
Physical Therapy Treatment Patient Details Name: Trevor Shelton MRN: [id number] DOB: 1965/10/27 Today's Date: 02/17/2020    History of Present Illness Patient is a 54 y/o male who presents with generalized weakness, rib pain, back pain. Admitted with hypercalcemia and AKI. Abdominal CT- lytic lesions throughout skeleton consistent with multiple myeloma or metastatic disease. Pathological fxs T5, T9 and acute rib fxs left 9-10th. No PMH.    PT Comments    Pt with some regression towards his physical therapy goals today, requiring increased assist with mobility. However, he remains very motivated to participate. Requiring moderate assist for bed mobility, two person moderate assist for transfers to standing. Pt unable to weight shift for pre gait. Worked on supine/seated therapeutic exercises for BLE strengthening and dynamic reaching for balance. Presents with decreased coordination, gross weakness, poor balance, and decreased endurance. Continue to recommend comprehensive inpatient rehab (CIR) for post-acute therapy needs.  Interpreter Graciela utilized for this session      Follow Up Recommendations  CIR;Supervision/Assistance - 24 hour     Equipment Recommendations  Rolling walker with 5" wheels;3in1 (PT);Wheelchair (measurements PT);Wheelchair cushion (measurements PT)    Recommendations for Other Services       Precautions / Restrictions Precautions Precautions: Fall Restrictions Weight Bearing Restrictions: No    Mobility  Bed Mobility Overal bed mobility: Needs Assistance Bed Mobility: Rolling;Supine to Sit;Sit to Supine Rolling: Min assist Sidelying to sit: Mod assist   Sit to supine: Mod assist   General bed mobility comments: Rolling towards left with minA to complete sidelying, modA for trunk elevation to upright, assist for BLE elevation back into bed  Transfers Overall transfer level: Needs assistance Equipment used: Rolling walker (2 wheeled) Transfers: Sit  to/from Stand Sit to Stand: Mod assist;+2 physical assistance         General transfer comment: ModA + 2 to stand from elevated surface, pt with crouched posture, bilateral knee instability. Returned to seated position and required cues for scooting hips posteriorly due to being too close to edge of bed. Then worked on reciprocal scooting towards head of bed with face to face assist.  Ambulation/Gait             General Gait Details: unable   Stairs             Wheelchair Mobility    Modified Rankin (Stroke Patients Only)       Balance Overall balance assessment: Needs assistance Sitting-balance support: Feet supported;Bilateral upper extremity supported Sitting balance-Leahy Scale: Poor Sitting balance - Comments: Reliant on BUE support, close supervision - min guard   Standing balance support: Bilateral upper extremity supported;During functional activity Standing balance-Leahy Scale: Poor Standing balance comment: reliant on RW                             Cognition Arousal/Alertness: Awake/alert Behavior During Therapy: WFL for tasks assessed/performed Overall Cognitive Status: Within Functional Limits for tasks assessed                                        Exercises General Exercises - Lower Extremity Long Arc Quad: Both;10 reps;Seated Heel Slides: Both;10 reps;Supine Hip ABduction/ADduction: Both;10 reps;Seated Heel Raises: Both;10 reps;Seated Other Exercises Other Exercises: Seated: dynamic reaching x 5 with BUE's    General Comments        Pertinent Vitals/Pain Pain Assessment: Faces Faces  Pain Scale: Hurts whole lot Pain Location: back, ribs Pain Descriptors / Indicators: Sore Pain Intervention(s): Limited activity within patient's tolerance;Monitored during session    Home Living Family/patient expects to be discharged to:: Private residence Living Arrangements: Spouse/significant other;Children                   Prior Function            PT Goals (current goals can now be found in the care plan section) Acute Rehab PT Goals Patient Stated Goal: less pain PT Goal Formulation: With patient Time For Goal Achievement: 02/29/20 Potential to Achieve Goals: Fair Progress towards PT goals: Not progressing toward goals - comment    Frequency    Min 3X/week      PT Plan Current plan remains appropriate    Co-evaluation              AM-PAC PT "6 Clicks" Mobility   Outcome Measure  Help needed turning from your back to your side while in a flat bed without using bedrails?: A Little Help needed moving from lying on your back to sitting on the side of a flat bed without using bedrails?: A Lot Help needed moving to and from a bed to a chair (including a wheelchair)?: A Lot Help needed standing up from a chair using your arms (e.g., wheelchair or bedside chair)?: A Lot Help needed to walk in hospital room?: Total Help needed climbing 3-5 steps with a railing? : Total 6 Click Score: 11    End of Session Equipment Utilized During Treatment: Gait belt Activity Tolerance: Patient tolerated treatment well Patient left: in bed;with call bell/phone within reach;with bed alarm set Nurse Communication: Mobility status PT Visit Diagnosis: Pain;Muscle weakness (generalized) (M62.81);Unsteadiness on feet (R26.81);Difficulty in walking, not elsewhere classified (R26.2) Pain - part of body:  (ribs, back)     Time: 1410-3013 PT Time Calculation (min) (ACUTE ONLY): 26 min  Charges:  $Therapeutic Exercise: 8-22 mins $Therapeutic Activity: 8-22 mins                       Wyona Almas, PT, DPT Acute Rehabilitation Services Pager [phone] Office [phone]    Deno Etienne 02/17/2020, 2:55 PM

## 2020-02-17 NOTE — Progress Notes (Signed)
Consult was received, I went by to see the patient around 230pm today and he was not in the room.  Dr. Armandina Gemma will be consulting on him this afternoon.    His SPEP is completed, His kappa lambda light chains are in process, and his 24 hour urine doesn't appear to have been sent, or is still in process.  He will need a bone marrow biopsy.    Dr. Lorenso Courier will stop by to see him later today.  Trevor Bihari, NP

## 2020-02-17 NOTE — Progress Notes (Signed)
Received critical lab calcium 14.0. Patient is at dialysis at this time. Sent page to on call MD.

## 2020-02-17 NOTE — Consult Note (Signed)
CC: weakness  HPI:     Patient is a 54 y.o. male who presented to the ER with generalized weakness and back pain.  He also had symptoms of pleuritic/rib pain, decreased urinary output, and constipation.   He was found to have hypercalcemia, renal failure and numerous lytic lesions suspicious for multiple myeloma.  He was admitted for workup.  His SPE demonstrated a single M spike in the gamma range.   Patient Active Problem List   Diagnosis Date Noted  . Hypercalcemia 02/14/2020  . AKI (acute kidney injury) ([place]) 02/14/2020  . Pathologic fracture 02/14/2020  . Hyponatremia 02/14/2020  . Leukocytosis 02/14/2020   Past Medical History:  Diagnosis Date  . AKI (acute kidney injury) ([place]) 02/2020  . Hypercalcemia 02/2020  . Hypercholesteremia   . Hypertension     Past Surgical History:  Procedure Laterality Date  . HERNIA REPAIR      Medications Prior to Admission  Medication Sig Dispense Refill Last Dose  . lisinopril-hydrochlorothiazide (ZESTORETIC) 20-25 MG tablet Take 1 tablet by mouth daily.   02/14/2020 at Unknown time  . simvastatin (ZOCOR) 20 MG tablet Take 20 mg by mouth daily.   02/14/2020 at Unknown time  . HYDROcodone-acetaminophen (NORCO/VICODIN) 5-325 MG tablet Take 1 tablet by mouth every 4 (four) hours as needed. (Patient not taking: Reported on 02/14/2020) 10 tablet 0 Completed Course at Unknown time  . lidocaine (LIDODERM) 5 % Place 1 patch onto the skin daily. Remove & Discard patch within 12 hours or as directed by MD (Patient not taking: Reported on 02/14/2020) 30 patch 0 Completed Course at Unknown time   No Known Allergies  Social History   Tobacco Use  . Smoking status: Never Smoker  . Smokeless tobacco: Never Used  Substance Use Topics  . Alcohol use: Not Currently    History reviewed. No pertinent family history.   Review of Systems Pertinent items are noted in HPI.  Objective:   Patient Vitals for the past 8 hrs:  BP Temp Temp src Pulse Resp SpO2   02/17/20 1302 (!) 159/85 97.7 F (36.5 C) Oral 77 18 94 %   I/O last 3 completed shifts: In: 4757.7 [P.O.:660; I.V.:4097.7] Out: 1800 [Urine:1650; Emesis/NG output:150] Total I/O In: -  Out: 350 [Urine:250; Emesis/NG output:100]    Exam deferred as patient in dialysis.  CT reviewed:  Numerous lytic lesions involving axial and appendicular skeleton.  At T5 there is compression fracture with ~50% loss of height, erosion of the posterior cortex as well as partial erosion of the right pedicle.  At T9, there is 40% loss of height with involvement of the pedicle on the right side.  MRI reviewed:  At T6, there is circumferential epidural disease with high grade cord compression.  At T9, there is right anterior epidural disease with deflection of the dura but no contact of the cord.  Assessment:   Principal Problem:   Hypercalcemia Active Problems:   AKI (acute kidney injury) ([place])   Pathologic fracture   Hyponatremia   Leukocytosis   Plan:  54 yo M with likely multiple myeloma and diffuse spinal involvement including high grade epidural disease at T6. - given the diffuse involvement of the spine and likely radiosensitive nature of the pathology, his epidural disease would be be addressed with radiation therapy.  I contacted the radiation oncology department-- he will be simulated for radiation treatment tomorrow. - If he has persistent mechanical back pain after radiation, he may be a candidate for vertebroplasty or  kyphoplasty at T6 and T9, which can be performed by interventional radiology - would recommend dexamethasone 6 mg q6 IV with frequent blood glucoses for now.  I conveyed these recommendations to the emergency room provider who contacted me on 8/8.  I discussed this with Dr. Alfredia Ferguson.

## 2020-02-17 NOTE — Progress Notes (Signed)
IR requested by Wilber Bihari, NP for possible image-guided bone marrow biopsy/aspiration.  Plan for possible procedure tentatively for tomorrow 02/18/2020 in IR pending IR scheduling. Patient will be NPO at midnight. Patient will be seen/consented for procedure tomorrow prior to procedure.  Please call IR with questions/concerns.   Bea Graff Nykerria Macconnell, PA-C 02/17/2020, 4:25 PM

## 2020-02-18 ENCOUNTER — Encounter: Payer: Self-pay | Admitting: Hematology and Oncology

## 2020-02-18 ENCOUNTER — Inpatient Hospital Stay: Payer: Self-pay | Attending: Hematology and Oncology | Admitting: Hematology and Oncology

## 2020-02-18 ENCOUNTER — Ambulatory Visit
Admit: 2020-02-18 | Discharge: 2020-02-18 | Disposition: A | Discharge status: Home / Self Care | Payer: Self-pay | Attending: Radiation Oncology | Admitting: Radiation Oncology

## 2020-02-18 ENCOUNTER — Telehealth: Payer: Self-pay | Admitting: *Deleted

## 2020-02-18 DIAGNOSIS — C9000 Multiple myeloma not having achieved remission: Principal | ICD-10-CM

## 2020-02-18 DIAGNOSIS — M549 Dorsalgia, unspecified: Secondary | ICD-10-CM | POA: Insufficient documentation

## 2020-02-18 DIAGNOSIS — M8440XA Pathological fracture, unspecified site, initial encounter for fracture: Secondary | ICD-10-CM | POA: Insufficient documentation

## 2020-02-18 DIAGNOSIS — Z803 Family history of malignant neoplasm of breast: Secondary | ICD-10-CM | POA: Insufficient documentation

## 2020-02-18 DIAGNOSIS — Z87891 Personal history of nicotine dependence: Secondary | ICD-10-CM | POA: Insufficient documentation

## 2020-02-18 DIAGNOSIS — M8458XA Pathological fracture in neoplastic disease, other specified site, initial encounter for fracture: Secondary | ICD-10-CM

## 2020-02-18 DIAGNOSIS — I10 Essential (primary) hypertension: Secondary | ICD-10-CM | POA: Insufficient documentation

## 2020-02-18 DIAGNOSIS — N179 Acute kidney failure, unspecified: Secondary | ICD-10-CM

## 2020-02-18 DIAGNOSIS — E785 Hyperlipidemia, unspecified: Secondary | ICD-10-CM | POA: Insufficient documentation

## 2020-02-18 LAB — RETICULOCYTES
Immature Retic Fract: 5.7 % (ref 2.3–15.9)
RBC.: 3.59 MIL/uL — ABNORMAL LOW (ref 4.22–5.81)
Retic Count, Absolute: 38.8 10*3/uL (ref 19.0–186.0)
Retic Ct Pct: 1.1 % (ref 0.4–3.1)

## 2020-02-18 LAB — COMPREHENSIVE METABOLIC PANEL
ALT: 25 U/L (ref 0–44)
AST: 29 U/L (ref 15–41)
Albumin: 2.3 g/dL — ABNORMAL LOW (ref 3.5–5.0)
Alkaline Phosphatase: 55 U/L (ref 38–126)
Anion gap: 8 (ref 5–15)
BUN: 36 mg/dL — ABNORMAL HIGH (ref 6–20)
CO2: 26 mmol/L (ref 22–32)
Calcium: 11.1 mg/dL — ABNORMAL HIGH (ref 8.9–10.3)
Chloride: 101 mmol/L (ref 98–111)
Creatinine, Ser: 3.57 mg/dL — ABNORMAL HIGH (ref 0.61–1.24)
GFR calc Af Amer: 21 mL/min — ABNORMAL LOW (ref >60)
GFR calc non Af Amer: 18 mL/min — ABNORMAL LOW (ref >60)
Glucose, Bld: 102 mg/dL — ABNORMAL HIGH (ref 70–99)
Potassium: 4.1 mmol/L (ref 3.5–5.1)
Sodium: 135 mmol/L (ref 135–145)
Total Bilirubin: 0.5 mg/dL (ref 0.3–1.2)
Total Protein: 5.4 g/dL — ABNORMAL LOW (ref 6.5–8.1)

## 2020-02-18 LAB — CBC WITH DIFFERENTIAL/PLATELET
Abs Immature Granulocytes: 0.12 10*3/uL — ABNORMAL HIGH (ref 0.00–0.07)
Basophils Absolute: 0 10*3/uL (ref 0.0–0.1)
Basophils Relative: 0 %
Eosinophils Absolute: 0.2 10*3/uL (ref 0.0–0.5)
Eosinophils Relative: 3 %
HCT: 30.8 % — ABNORMAL LOW (ref 39.0–52.0)
Hemoglobin: 10.7 g/dL — ABNORMAL LOW (ref 13.0–17.0)
Immature Granulocytes: 1 %
Lymphocytes Relative: 6 %
Lymphs Abs: 0.5 10*3/uL — ABNORMAL LOW (ref 0.7–4.0)
MCH: 29.8 pg (ref 26.0–34.0)
MCHC: 34.7 g/dL (ref 30.0–36.0)
MCV: 85.8 fL (ref 80.0–100.0)
Monocytes Absolute: 0.4 10*3/uL (ref 0.1–1.0)
Monocytes Relative: 4 %
Neutro Abs: 7.9 10*3/uL — ABNORMAL HIGH (ref 1.7–7.7)
Neutrophils Relative %: 86 %
Platelets: 178 10*3/uL (ref 150–400)
RBC: 3.59 MIL/uL — ABNORMAL LOW (ref 4.22–5.81)
RDW: 11.2 % — ABNORMAL LOW (ref 11.5–15.5)
WBC: 9.1 10*3/uL (ref 4.0–10.5)
nRBC: 0 % (ref 0.0–0.2)

## 2020-02-18 LAB — VITAMIN B12: Vitamin B-12: 413 pg/mL (ref 180–914)

## 2020-02-18 LAB — FERRITIN: Ferritin: 410 ng/mL — ABNORMAL HIGH (ref 24–336)

## 2020-02-18 LAB — IRON AND TIBC
Iron: 38 ug/dL — ABNORMAL LOW (ref 45–182)
Saturation Ratios: 16 % — ABNORMAL LOW (ref 17.9–39.5)
TIBC: 238 ug/dL — ABNORMAL LOW (ref 250–450)
UIBC: 200 ug/dL

## 2020-02-18 LAB — MAGNESIUM: Magnesium: 1.7 mg/dL (ref 1.7–2.4)

## 2020-02-18 LAB — PROTIME-INR
INR: 1.1 (ref 0.8–1.2)
Prothrombin Time: 13.8 seconds (ref 11.4–15.2)

## 2020-02-18 LAB — HEMOGLOBIN A1C
Hgb A1c MFr Bld: 6.5 % — ABNORMAL HIGH (ref 4.8–5.6)
Mean Plasma Glucose: 139.85 mg/dL

## 2020-02-18 LAB — KAPPA/LAMBDA LIGHT CHAINS
Kappa free light chain: 37.5 mg/L — ABNORMAL HIGH (ref 3.3–19.4)
Kappa, lambda light chain ratio: 3.79 — ABNORMAL HIGH (ref 0.26–1.65)
Lambda free light chains: 9.9 mg/L (ref 5.7–26.3)

## 2020-02-18 LAB — PHOSPHORUS: Phosphorus: 3 mg/dL (ref 2.5–4.6)

## 2020-02-18 LAB — FOLATE: Folate: 11.7 ng/mL (ref >5.9)

## 2020-02-18 MED ORDER — ENSURE ENLIVE PO LIQD
237.0000 mL | Freq: Two times a day (BID) | ORAL | Status: DC
  Administered 2020-02-20 – 2020-02-23 (×6): 237 mL via ORAL

## 2020-02-18 MED ORDER — HEPARIN SODIUM (PORCINE) 5000 UNIT/ML IJ SOLN
5000.0000 [IU] | Freq: Three times a day (TID) | INTRAMUSCULAR | Status: DC
  Administered 2020-02-19 – 2020-03-03 (×37): 5000 [IU] via SUBCUTANEOUS
  Filled 2020-02-18 (×36): qty 1

## 2020-02-18 MED ORDER — PANTOPRAZOLE SODIUM 40 MG IV SOLR
40.0000 mg | INTRAVENOUS | Status: DC
  Administered 2020-02-18 – 2020-02-21 (×4): 40 mg via INTRAVENOUS
  Filled 2020-02-18 (×4): qty 40

## 2020-02-18 NOTE — Progress Notes (Signed)
The patient was consented today with the help of one of our Spanish-speaking staff members, we will guidewire over the area of his rib discomfort.  He has signed written consent to proceed.  He has 2/5 strength in his left lower extremity, not significantly improved or him worsened per report, he has intact sensation bilaterally, and 5/5 strength on the right lower extremity.    Carola Rhine, PAC

## 2020-02-18 NOTE — Progress Notes (Signed)
[facility] Progress Note   54 y.o.maleHTN p/wgeneralized weakness, left rib and lower back pain + constipation for a mth aftersustaininga fall from a ladder a mth ago. Pain is worse with deep inspiration and also has noted decreased urine output as well but denies obstructive like symptoms or incontinence.Noted on on CT to have diffuse lyticlesions throughout the skeletonand pathologic T5 and T9 , left 9th and 10th rib fractures. T6 level tumor extension narrowing thecal sac. He has been using Naproxen daily for the past 15 days.  Assessment/ Plan:   1. 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 Awaiting lab tests to confirm; no absolute indication for RRT at this time and no e/o obstruction on CT A/P.  - Hydratingaggressively  with strict I&O's, daily weights and close monitoring of UOP and respiratory status.  - Started RRT (hopefully temporarily) given uremic signs HD #1 on 8/11. Appreciate VIR placing the catheter promptly.  - TPE may be too late given he has had symptoms for a mth and oncology would like to hold off as well.   Check urine 24hr UPEP; if no light chains in urine then myeloma kidney less likely.   - Check bladder scan to make sure he's not retaining with all the aggressive IV hydration (nothing on flowsheets).  Plan on HD #2 Fri or Sat.   2. Hypercalcemia - concerning for myeloma and at this time will hydrate aggressively with isotonic fluids. PTH is low consistent which rules out primary hyperparathyroid; appropriate response for level of hypercalcemia. - Started on Calcitonin; given compromised renal function  Given lower dose pamidronate. - Improving with HD as well.  3. HTN - resume home regimen. 4. Hyponatremia - may be an element of pseudo from the paraproteinemia. Will continue to monitor for now and will also check a urine and serum osmolality, TSH, cortisol. - Serum osmolality is  high likely representing the paraprotein. 5. Constipation from prerenal as well as the hypercalcemia. He also has impingement but 4-5/5 strength in the legs.  Subjective:   Nausea resolved with dialysis but poor appetite, denies obstructive symptoms or dyspnea. C/o pain especially right flank. Weakness left leg   Objective:   BP (!) 151/81 (BP Location: Left Arm)    Pulse 78    Temp 97.6 F (36.4 C)    Resp 18    Ht 5\' 3"  (1.6 m)    Wt 77.9 kg    SpO2 97%    BMI 30.42 kg/m   Intake/Output Summary (Last 24 hours) at 02/18/2020 1129 Last data filed at 02/18/2020 0505 Gross per 24 hour  Intake --  Output 550 ml  Net -550 ml   Weight change: 2.6 kg  Physical Exam: GEN: NAD, A&Ox3, NCAT HEENT:Conjunctival pallor, EOMI NECK: Supple, no thyromegaly LUNGS: CTA B/L no rales, rhonchi or wheezing CV: RRR, No M/R/G ABD: SNDNT +BS  EXT: No lower extremity edema, left leg markedly weaker than right  Imaging: IR Fluoro Guide CV Line Right  Result Date: 02/17/2020 CLINICAL DATA:  Renal failure and need for tunneled hemodialysis catheter to begin hemodialysis. EXAM: TUNNELED CENTRAL VENOUS HEMODIALYSIS CATHETER PLACEMENT WITH ULTRASOUND AND FLUOROSCOPIC GUIDANCE ANESTHESIA/SEDATION: 1.0 mg IV Versed; 50 mcg IV Fentanyl. Total Moderate Sedation Time:   23 minutes. The patient's level of consciousness and physiologic status were continuously monitored during the procedure by Radiology nursing. MEDICATIONS: 2 g IV Ancef. FLUOROSCOPY TIME:  48 seconds. 4.0 mGy. PROCEDURE: The procedure, risks, benefits,  and alternatives were explained to the patient. Questions regarding the procedure were encouraged and answered. The patient understands and consents to the procedure. A timeout was performed prior to initiating the procedure. The right neck and chest were prepped with chlorhexidine in a sterile fashion, and a sterile drape was applied covering the operative field. Maximum barrier sterile technique with  sterile gowns and gloves were used for the procedure. Local anesthesia was provided with 1% lidocaine. Ultrasound was used to confirm patency of the right internal jugular vein. After creating a small venotomy incision, a 21 gauge needle was advanced into the right internal jugular vein under direct, real-time ultrasound guidance. Ultrasound image documentation was performed. After securing guidewire access, an 8 Fr dilator was placed. A J-wire was kinked to measure appropriate catheter length. A Palindrome tunneled hemodialysis catheter measuring 19 cm from tip to cuff was chosen for placement. This was tunneled in a retrograde fashion from the chest wall to the venotomy incision. At the venotomy, serial dilatation was performed and a 15 Fr peel-away sheath was placed over a guidewire. The catheter was then placed through the sheath and the sheath removed. Final catheter positioning was confirmed and documented with a fluoroscopic spot image. The catheter was aspirated, flushed with saline, and injected with appropriate volume heparin dwells. The venotomy incision was closed with subcuticular 4-0 Vicryl. Dermabond was applied to the incision. The catheter exit site was secured with 0-Prolene retention sutures. COMPLICATIONS: None.  No pneumothorax. FINDINGS: After catheter placement, the tip lies in the right atrium. The catheter aspirates normally and is ready for immediate use. IMPRESSION: Placement of tunneled hemodialysis catheter via the right internal jugular vein. The catheter tip lies in the right atrium. The catheter is ready for immediate use. Electronically Signed   By: Aletta Edouard M.D.   On: 02/17/2020 17:22   IR US Guide Vasc Access Right  Result Date: 02/17/2020 CLINICAL DATA:  Renal failure and need for tunneled hemodialysis catheter to begin hemodialysis. EXAM: TUNNELED CENTRAL VENOUS HEMODIALYSIS CATHETER PLACEMENT WITH ULTRASOUND AND FLUOROSCOPIC GUIDANCE ANESTHESIA/SEDATION: 1.0 mg IV  Versed; 50 mcg IV Fentanyl. Total Moderate Sedation Time:   23 minutes. The patient's level of consciousness and physiologic status were continuously monitored during the procedure by Radiology nursing. MEDICATIONS: 2 g IV Ancef. FLUOROSCOPY TIME:  48 seconds. 4.0 mGy. PROCEDURE: The procedure, risks, benefits, and alternatives were explained to the patient. Questions regarding the procedure were encouraged and answered. The patient understands and consents to the procedure. A timeout was performed prior to initiating the procedure. The right neck and chest were prepped with chlorhexidine in a sterile fashion, and a sterile drape was applied covering the operative field. Maximum barrier sterile technique with sterile gowns and gloves were used for the procedure. Local anesthesia was provided with 1% lidocaine. Ultrasound was used to confirm patency of the right internal jugular vein. After creating a small venotomy incision, a 21 gauge needle was advanced into the right internal jugular vein under direct, real-time ultrasound guidance. Ultrasound image documentation was performed. After securing guidewire access, an 8 Fr dilator was placed. A J-wire was kinked to measure appropriate catheter length. A Palindrome tunneled hemodialysis catheter measuring 19 cm from tip to cuff was chosen for placement. This was tunneled in a retrograde fashion from the chest wall to the venotomy incision. At the venotomy, serial dilatation was performed and a 15 Fr peel-away sheath was placed over a guidewire. The catheter was then placed through the sheath and the sheath  removed. Final catheter positioning was confirmed and documented with a fluoroscopic spot image. The catheter was aspirated, flushed with saline, and injected with appropriate volume heparin dwells. The venotomy incision was closed with subcuticular 4-0 Vicryl. Dermabond was applied to the incision. The catheter exit site was secured with 0-Prolene retention sutures.  COMPLICATIONS: None.  No pneumothorax. FINDINGS: After catheter placement, the tip lies in the right atrium. The catheter aspirates normally and is ready for immediate use. IMPRESSION: Placement of tunneled hemodialysis catheter via the right internal jugular vein. The catheter tip lies in the right atrium. The catheter is ready for immediate use. Electronically Signed   By: Aletta Edouard M.D.   On: 02/17/2020 17:22    Labs: BMET Recent Labs  Lab 02/14/20 1252 02/14/20 1602 02/15/20 0202 02/16/20 0028 02/17/20 0102 02/17/20 1800 02/18/20 0250  NA 125*  --  130* 128* 129* 129* 135  K 4.4  --  4.9 4.5 4.6 4.2 4.1  CL 82*  --  89* 93* 96* 97* 101  CO2 29  --  32 23 25 22 26   GLUCOSE 158*  --  116* 116* 123* 95 102*  BUN 84*  --  81* 76* 67* 65* 36*  CREATININE 4.73*  --  4.92* 4.53* 4.74* 4.68* 3.57*  CALCIUM >15.0* 15.8* >15.0* 13.5* 14.1* 14.0* 11.1*  PHOS  --   --  6.3*  --  5.5* 5.3* 3.0   CBC Recent Labs  Lab 02/16/20 0028 02/17/20 0102 02/17/20 1800 02/18/20 0250  WBC 10.9* 11.1* 10.2 9.1  NEUTROABS  --   --   --  7.9*  HGB 11.5* 10.9* 11.3* 10.7*  HCT 33.0* 31.6* 32.7* 30.8*  MCV 84.6 84.3 85.6 85.8  PLT 173 181 182 178    Medications:     Chlorhexidine Gluconate Cloth  6 each Topical Q0600   dexamethasone (DECADRON) injection  6 mg Intravenous Q6H   docusate sodium  100 mg Oral Daily   heparin injection (subcutaneous)  5,000 Units Subcutaneous Q8H   lidocaine  1 patch Transdermal Q24H   pantoprazole (PROTONIX) IV  40 mg Intravenous Q24H   polyethylene glycol  17 g Oral BID   senna-docusate  1 tablet Oral BID   sodium chloride flush  3 mL Intravenous Once      Otelia Santee, MD 02/18/2020, 11:29 AM

## 2020-02-18 NOTE — Progress Notes (Signed)
Patient scheduled for Radiation Therapy at [facility] on 02/18/20  at 9 am and 2 pm. Transportation arrangement set up with [facility]  and patient and wife notified.

## 2020-02-18 NOTE — Progress Notes (Signed)
[place] [phone]   [phone]  INITIAL CONSULT NOTE  Patient Care Team: Patient, No Pcp Per as PCP - General (General Practice)  Hematological/Oncological History # Lytic Lesions Throughout Skeleton/Hypercalcemia # Pathologic Fractures of Spine/Soft Tissue Masses in Spine #Concern for Multiple Myeloma 1) 02/14/2020: presented to the emergency department with markedly worsening back pain. 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 The T6 lesion severely narrows the thecal sac and deforms the spinal cord. 3) 02/18/2020: establish care with Dr. Lorenso Courier   CHIEF COMPLAINTS/PURPOSE OF CONSULTATION:  "Concern for Multiple Myeloma "  HISTORY OF PRESENTING ILLNESS:  Trevor Shelton 54 y.o. male with medical history significant for HTN and HLD who presented with back pain and was found to have cord compression and lytic lesions concerning for multiple myeloma.   On review of the previous records progress he initially presented to the emergency department on 02/14/2020 with markedly worsening back pain. The pain has been going on for approximately 1 month's time when he initially presented. In the emergency department had a CT chest abdomen pelvis which showed lytic lesions throughout the skeleton consistent with multiple myeloma or metastatic disease. There were also signs of compression fracture of the spine. An MRI spine was ordered which showed epidural tumor extension at T6 and T8-9. The T6 lesion severely narrowed the thecal sac and deforms the spinal cord. The patient was referred to radiation oncology. In addition to radiation oncology, medical oncology was consulted for further evaluation and management as well.  On exam today Trevor Shelton notes that his pain is well under control, though he did receive a Dilaudid shot prior to  coming over to the [facility]. He notes that the pain is predominantly in his back, though it does extend around to his chest. He is also having pain in his left lower extremity and having some weakness and numbness there as well. The patient reports that all of this began on approximate July 2 when he had a fall from a stepladder. He notes that he has no prior history of any blood disorders, cancers, or other major health issues other than hypertension and hyperlipidemia.  On further review his family history is unremarkable except for a sister that had breast cancer. He reports that he smoked approximately 25 years ago from ages [age] and he has not smoked since. He smoked approximately 8 to 9 cigarettes/day. He is a Curator by trade. He notes that otherwise he is not having any fevers, chills, sweats, nausea, vomiting, or diarrhea. A full 10 point ROS is listed below.  MEDICAL HISTORY:  Past Medical History:  Diagnosis Date  . AKI (acute kidney injury) ([place]) 02/2020  . Hypercalcemia 02/2020  . Hypercholesteremia   . Hypertension     SURGICAL HISTORY: Past Surgical History:  Procedure Laterality Date  . HERNIA REPAIR    . IR FLUORO GUIDE CV LINE RIGHT  02/17/2020  . IR US GUIDE VASC ACCESS RIGHT  02/17/2020    SOCIAL HISTORY: Social History   Socioeconomic History  . Marital status: Married    Spouse name: Not on file  . Number of children: Not on file  . Years of education: Not on file  . Highest education level: Not on file  Occupational History  . Not on file  Tobacco Use  . Smoking status: Never Smoker  . Smokeless  tobacco: Never Used  Vaping Use  . Vaping Use: Never used  Substance and Sexual Activity  . Alcohol use: Not Currently  . Drug use: Not Currently  . Sexual activity: Not Currently  Other Topics Concern  . Not on file  Social History Narrative  . Not on file   Social Determinants of Health   Financial Resource Strain:   . Difficulty of  Paying Living Expenses:   Food Insecurity:   . Worried About Charity fundraiser in the Last Year:   . Arboriculturist in the Last Year:   Transportation Needs:   . Film/video editor (Medical):   Marland Kitchen Lack of Transportation (Non-Medical):   Physical Activity:   . Days of Exercise per Week:   . Minutes of Exercise per Session:   Stress:   . Feeling of Stress :   Social Connections:   . Frequency of Communication with Friends and Family:   . Frequency of Social Gatherings with Friends and Family:   . Attends Religious Services:   . Active Member of Clubs or Organizations:   . Attends Archivist Meetings:   Marland Kitchen Marital Status:   Intimate Partner Violence:   . Fear of Current or Ex-Partner:   . Emotionally Abused:   Marland Kitchen Physically Abused:   . Sexually Abused:     FAMILY HISTORY: No family history on file.  ALLERGIES:  has No Known Allergies.  MEDICATIONS:  No current facility-administered medications for this visit.   No current outpatient medications on file.   Facility-Administered Medications Ordered in Other Visits  Medication Dose Route Frequency Provider Last Rate Last Admin  . 0.9 %  sodium chloride infusion   Intravenous Continuous Pahwani, Rinka R, MD 150 mL/hr at 02/17/20 0558 New Bag at 02/17/20 0558  . acetaminophen (TYLENOL) tablet 650 mg  650 mg Oral Q6H PRN Pahwani, Rinka R, MD       Or  . acetaminophen (TYLENOL) suppository 650 mg  650 mg Rectal Q6H PRN Pahwani, Rinka R, MD      . bisacodyl (DULCOLAX) suppository 10 mg  10 mg Rectal Daily PRN Raiford Noble Latif, DO      . Chlorhexidine Gluconate Cloth 2 % PADS 6 each  6 each Topical Q0600 Dwana Melena, MD   6 each at 02/18/20 [phone]  . dexamethasone (DECADRON) injection 6 mg  6 mg Intravenous Q6H Sheikh, Omair [place], DO   6 mg at 02/18/20 1626  . docusate sodium (COLACE) capsule 100 mg  100 mg Oral Daily Pahwani, Rinka R, MD   100 mg at 02/18/20 1022  . [START ON 02/19/2020] feeding supplement (ENSURE  ENLIVE) (ENSURE ENLIVE) liquid 237 mL  237 mL Oral BID BM Sheikh, Omair Latif, DO      . heparin injection 5,000 Units  5,000 Units Subcutaneous Q8H Monia Sabal, PA-C   5,000 Units at 02/18/20 5852  . hydrALAZINE (APRESOLINE) injection 10 mg  10 mg Intravenous Q6H PRN Raiford Noble Latif, DO      . HYDROmorphone (DILAUDID) injection 0.5-1 mg  0.5-1 mg Intravenous Q2H PRN Pahwani, Rinka R, MD   1 mg at 02/18/20 1624  . lidocaine (LIDODERM) 5 % 1 patch  1 patch Transdermal Q24H Nolberto Hanlon, MD   1 patch at 02/16/20 1428  . ondansetron (ZOFRAN) tablet 4 mg  4 mg Oral Q6H PRN Pahwani, Rinka R, MD   4 mg at 02/15/20 1616   Or  . ondansetron (ZOFRAN) injection 4  mg  4 mg Intravenous Q6H PRN Pahwani, Rinka R, MD   4 mg at 02/17/20 0020  . oxyCODONE (Oxy IR/ROXICODONE) immediate release tablet 5 mg  5 mg Oral Q4H PRN Pahwani, Rinka R, MD   5 mg at 02/16/20 2045  . pantoprazole (PROTONIX) injection 40 mg  40 mg Intravenous Q24H Sheikh, Georgina [place], DO   40 mg at 02/18/20 1022  . polyethylene glycol (MIRALAX / GLYCOLAX) packet 17 g  17 g Oral BID Raiford Noble [place], DO   17 g at 02/18/20 1022  . senna-docusate (Senokot-S) tablet 1 tablet  1 tablet Oral BID Raiford Noble [place], DO   1 tablet at 02/18/20 1023  . sodium chloride flush (NS) 0.9 % injection 3 mL  3 mL Intravenous Once Virgel Manifold, MD        REVIEW OF SYSTEMS:   Constitutional: ( - ) fevers, ( - )  chills , ( - ) night sweats Eyes: ( - ) blurriness of vision, ( - ) double vision, ( - ) watery eyes Ears, nose, mouth, throat, and face: ( - ) mucositis, ( - ) sore throat Respiratory: ( - ) cough, ( - ) dyspnea, ( - ) wheezes Cardiovascular: ( - ) palpitation, ( - ) chest discomfort, ( - ) lower extremity swelling Gastrointestinal:  ( - ) nausea, ( - ) heartburn, ( - ) change in bowel habits Skin: ( - ) abnormal skin rashes Lymphatics: ( - ) new lymphadenopathy, ( - ) easy bruising Neurological: ( - ) numbness, ( - ) tingling, ( - ) new  weaknesses Behavioral/Psych: ( - ) mood change, ( - ) new changes  All other systems were reviewed with the patient and are negative.  PHYSICAL EXAMINATION:  GENERAL: acutely ill appearing middle aged Hispanic male in NAD  SKIN: skin color, texture, turgor are normal, no rashes or significant lesions EYES: conjunctiva are pink and non-injected, sclera is injected LUNGS: clear to auscultation and percussion with normal breathing effort HEART: regular rate & rhythm and no murmurs and no lower extremity edema ABDOMEN: soft, non-tender, non-distended, normal bowel sounds Musculoskeletal: no cyanosis of digits and no clubbing  PSYCH: alert & oriented x 3, fluent speech NEURO: no focal motor/sensory deficits  LABORATORY DATA:  I have reviewed the data as listed CBC Latest Ref Rng & Units 02/18/2020 02/17/2020 02/17/2020  WBC 4.0 - 10.5 K/uL 9.1 10.2 11.1(H)  Hemoglobin 13.0 - 17.0 g/dL 10.7(L) 11.3(L) 10.9(L)  Hematocrit 39 - 52 % 30.8(L) 32.7(L) 31.6(L)  Platelets 150 - 400 K/uL 178 182 181    CMP Latest Ref Rng & Units 02/18/2020 02/17/2020 02/17/2020  Glucose 70 - 99 mg/dL 102(H) 95 123(H)  BUN 6 - 20 mg/dL 36(H) 65(H) 67(H)  Creatinine 0.61 - 1.24 mg/dL 3.57(H) 4.68(H) 4.74(H)  Sodium 135 - 145 mmol/L 135 129(L) 129(L)  Potassium 3.5 - 5.1 mmol/L 4.1 4.2 4.6  Chloride 98 - 111 mmol/L 101 97(L) 96(L)  CO2 22 - 32 mmol/L _0 Calcium 8.9 - 10.3 mg/dL 11.1(H) 14.0(HH) 14.1(HH)  Total Protein 6.5 - 8.1 g/dL 5.4(L) - -  Total Bilirubin 0.3 - 1.2 mg/dL 0.5 - -  Alkaline Phos 38 - 126 U/L 55 - -  AST 15 - 41 U/L 29 - -  ALT 0 - 44 U/L 25 - -     RADIOGRAPHIC STUDIES: I have personally reviewed the radiological images as listed and agreed with the findings in the report pronounced lytic lesions  and cord compression in thoracic spine.   CT ABDOMEN PELVIS WO CONTRAST  Result Date: 02/14/2020 CLINICAL DATA:  Abdominal trauma. Trauma 1 month ago. Hypercalcemia. Fell 1 month ago.  Abdominal pain, LOWER back pain, vomiting, diarrhea since the fall. EXAM: CT CHEST, ABDOMEN AND PELVIS WITHOUT CONTRAST TECHNIQUE: Multidetector CT imaging of the chest, abdomen and pelvis was performed following the standard protocol without IV contrast. COMPARISON:  None. FINDINGS: CT CHEST FINDINGS Cardiovascular: Heart is normal in size. Minimal atherosclerotic calcification of the coronary vessels. There is focal calcification of the ascending thoracic aorta, not associated with aneurysm. Normal noncontrast appearance of the pulmonary arteries. Mediastinum/Nodes: The visualized portion of the thyroid gland has a normal appearance. No significant mediastinal, hilar, or axillary adenopathy. Lungs/Pleura: There is subsegmental atelectasis at the lung bases. No suspicious pulmonary nodules. There is focal pleural thickening adjacent to LEFT anterior second rib lesion. Musculoskeletal: Diffuse lytic lesions are identified throughout the skeleton. Lesions vary in size from 2 millimeters to 1.8 centimeters. There is near complete replacement of the T5 vertebral body and loss of vertebral body height by approximately 30%. A soft tissue component extends to the RIGHT of the vertebral body and extends into the canal, impinging on the spinal cord, image 25 of series 3. Soft tissue mass in this region is 4.2 x 3.2 centimeters. At T9, there is near complete replacement of the vertebral body with soft tissue component to the RIGHT of the vertebral body. Loss of vertebral body height is approximately 30%. Suspect impingement of the cord at this level. Image 38 of series 3. Large lytic lesion is identified within the T5 vertebral body, measuring 1.8 centimeters. This lesion extends to the RIGHT cortex 2. Acute fracture of LEFT ribs 9 and 10, possibly pathologic. There is a soft tissue component associated with lesions in the LEFT second rib. Soft tissue component measures 1.1 x 1.8 centimeters. CT ABDOMEN PELVIS FINDINGS  Hepatobiliary: No focal liver abnormality is seen. No gallstones, gallbladder wall thickening, or biliary dilatation. Pancreas: Unremarkable. No pancreatic ductal dilatation or surrounding inflammatory changes. Spleen: Normal in size without focal abnormality. Adrenals/Urinary Tract: Adrenal glands are unremarkable. Kidneys are normal, without renal calculi, focal lesion, or hydronephrosis. Bladder is unremarkable. Stomach/Bowel: Stomach and small bowel loops are unremarkable. The appendix is well seen and has a normal appearance. Loops of colon are unremarkable. Average stool burden. Vascular/Lymphatic: No significant vascular findings are present. No enlarged abdominal or pelvic lymph nodes. Reproductive: Prostate is mildly enlarged. There are prostatic calcifications. Other: Fat containing RIGHT inguinal hernia.  No ascites. Musculoskeletal: Diffuse lytic lesions throughout the pelvis, proximal femurs, and lumbar spine. IMPRESSION: 1. Diffuse lytic lesions throughout the skeleton, consistent with multiple myeloma or metastatic disease. Multiple myeloma is favored. 2. Pathologic fractures at T5 and T9. These levels show significant soft tissue mass, and impingement upon the spinal cord. Recommend further evaluation with MRI of the thoracic spine with and without contrast. 3. Acute fractures of LEFT ribs 9 and 10, possibly pathologic. 4. No evidence for acute injury of the abdomen or pelvis. 5. Fat containing RIGHT inguinal hernia. 6. Mildly enlarged prostate. 7. Aortic Atherosclerosis (ICD10-I70.0). These results were called by telephone at the time of interpretation on 02/14/2020 at 4:06 pm to provider [facility] , who verbally acknowledged these results. Electronically Signed   By: Nolon Nations M.D.   On: 02/14/2020 16:07   DG Ribs Bilateral W/Chest  Result Date: 01/26/2020 CLINICAL DATA:  Right rib pain after fall at work. EXAM:  BILATERAL RIBS AND CHEST - 4+ VIEW COMPARISON:  None. FINDINGS: No fracture  or other bone lesions are seen involving the ribs. There is no evidence of pneumothorax or pleural effusion. Both lungs are clear. Heart size and mediastinal contours are within normal limits. IMPRESSION: Negative. Electronically Signed   By: Marijo Conception M.D.   On: 01/26/2020 13:20   DG Thoracic Spine 2 View  Result Date: 01/26/2020 CLINICAL DATA:  Back pain after fall at work. EXAM: THORACIC SPINE 2 VIEWS COMPARISON:  None. FINDINGS: There is no evidence of thoracic spine fracture. Alignment is normal. No other significant bone abnormalities are identified. IMPRESSION: Negative. Electronically Signed   By: Marijo Conception M.D.   On: 01/26/2020 13:21   DG Lumbar Spine Complete  Result Date: 01/26/2020 CLINICAL DATA:  Fall, back pain. Additional provided: Fall 3 weeks ago, sharp pain in posterior back on right side. EXAM: LUMBAR SPINE - COMPLETE 4+ VIEW COMPARISON:  No pertinent prior studies available for comparison. FINDINGS: For the purposes of this dictation, five lumbar vertebrae are assumed and the caudal most well-formed intervertebral disc is designated L5-S1. Rudimentary ribs at the T12 level. Mild L1-L2, L2-L3, L3-L4, L4-L5 grade 1 retrolisthesis. No lumbar compression deformity is demonstrated. Intervertebral disc height is maintained. Mild facet arthrosis greatest at L5-S1. Suspected fusion across the sacroiliac joints bilaterally IMPRESSION: No lumbar compression deformity is demonstrated. Mild L1-L2, L2-L3, L3-L4, L4-L5 grade 1 retrolisthesis. Mild lumbar spondylosis as outlined. Suspected fusion across the sacroiliac joints bilaterally. Electronically Signed   By: Kellie Simmering DO   On: 01/26/2020 10:15   CT Chest Wo Contrast  Result Date: 02/14/2020 CLINICAL DATA:  Abdominal trauma. Trauma 1 month ago. Hypercalcemia. Fell 1 month ago. Abdominal pain, LOWER back pain, vomiting, diarrhea since the fall. EXAM: CT CHEST, ABDOMEN AND PELVIS WITHOUT CONTRAST TECHNIQUE: Multidetector CT imaging  of the chest, abdomen and pelvis was performed following the standard protocol without IV contrast. COMPARISON:  None. FINDINGS: CT CHEST FINDINGS Cardiovascular: Heart is normal in size. Minimal atherosclerotic calcification of the coronary vessels. There is focal calcification of the ascending thoracic aorta, not associated with aneurysm. Normal noncontrast appearance of the pulmonary arteries. Mediastinum/Nodes: The visualized portion of the thyroid gland has a normal appearance. No significant mediastinal, hilar, or axillary adenopathy. Lungs/Pleura: There is subsegmental atelectasis at the lung bases. No suspicious pulmonary nodules. There is focal pleural thickening adjacent to LEFT anterior second rib lesion. Musculoskeletal: Diffuse lytic lesions are identified throughout the skeleton. Lesions vary in size from 2 millimeters to 1.8 centimeters. There is near complete replacement of the T5 vertebral body and loss of vertebral body height by approximately 30%. A soft tissue component extends to the RIGHT of the vertebral body and extends into the canal, impinging on the spinal cord, image 25 of series 3. Soft tissue mass in this region is 4.2 x 3.2 centimeters. At T9, there is near complete replacement of the vertebral body with soft tissue component to the RIGHT of the vertebral body. Loss of vertebral body height is approximately 30%. Suspect impingement of the cord at this level. Image 38 of series 3. Large lytic lesion is identified within the T5 vertebral body, measuring 1.8 centimeters. This lesion extends to the RIGHT cortex 2. Acute fracture of LEFT ribs 9 and 10, possibly pathologic. There is a soft tissue component associated with lesions in the LEFT second rib. Soft tissue component measures 1.1 x 1.8 centimeters. CT ABDOMEN PELVIS FINDINGS Hepatobiliary: No focal liver abnormality  is seen. No gallstones, gallbladder wall thickening, or biliary dilatation. Pancreas: Unremarkable. No pancreatic  ductal dilatation or surrounding inflammatory changes. Spleen: Normal in size without focal abnormality. Adrenals/Urinary Tract: Adrenal glands are unremarkable. Kidneys are normal, without renal calculi, focal lesion, or hydronephrosis. Bladder is unremarkable. Stomach/Bowel: Stomach and small bowel loops are unremarkable. The appendix is well seen and has a normal appearance. Loops of colon are unremarkable. Average stool burden. Vascular/Lymphatic: No significant vascular findings are present. No enlarged abdominal or pelvic lymph nodes. Reproductive: Prostate is mildly enlarged. There are prostatic calcifications. Other: Fat containing RIGHT inguinal hernia.  No ascites. Musculoskeletal: Diffuse lytic lesions throughout the pelvis, proximal femurs, and lumbar spine. IMPRESSION: 1. Diffuse lytic lesions throughout the skeleton, consistent with multiple myeloma or metastatic disease. Multiple myeloma is favored. 2. Pathologic fractures at T5 and T9. These levels show significant soft tissue mass, and impingement upon the spinal cord. Recommend further evaluation with MRI of the thoracic spine with and without contrast. 3. Acute fractures of LEFT ribs 9 and 10, possibly pathologic. 4. No evidence for acute injury of the abdomen or pelvis. 5. Fat containing RIGHT inguinal hernia. 6. Mildly enlarged prostate. 7. Aortic Atherosclerosis (ICD10-I70.0). These results were called by telephone at the time of interpretation on 02/14/2020 at 4:06 pm to provider [facility] , who verbally acknowledged these results. Electronically Signed   By: Nolon Nations M.D.   On: 02/14/2020 16:07   MR THORACIC SPINE W WO CONTRAST  Result Date: 02/14/2020 CLINICAL DATA:  Back pain. Compression fracture. Fever with nausea and vomiting. EXAM: MRI THORACIC WITHOUT AND WITH CONTRAST TECHNIQUE: Multiplanar and multiecho pulse sequences of the thoracic spine were obtained without and with intravenous contrast. CONTRAST:  67m GADAVIST  GADOBUTROL 1 MMOL/ML IV SOLN COMPARISON:  None. FINDINGS: MRI THORACIC SPINE FINDINGS Alignment:  Normal Vertebrae: There are numerous bone marrow lesions throughout all visualized levels, but worst at T5, T6 and T9. There is compression deformity of T6 with 50% height loss and 5 mm of retropulsion. There is compression deformity of T9 with approximately 10% height loss and 3 mm of retropulsion. Cord: There is mild hyperintense T2-weighted signal in the spinal cord at T5. Paraspinal and other soft tissues: There is soft tissue mass is associated with the vertebral lesions at T6 and T9. Otherwise, the paraspinal soft tissues are unremarkable. Disc levels: T5-6: Contrast-enhancing mass replaces most of the vertebral body bone marrow and extends into the pedicles, transverse processes and the right paraspinal tissues. There is also epidural extension that severely narrows the thecal sac and causes mass effect on the spinal cord. The mass also severely narrows the right T5 neural foramen. T8-9: Soft tissue mass replacing much of the bone marrow at T9 with epidural extension mildly narrowing the thecal sac with slight mass effect on the spinal cord. The mass extends into the right neural foramen. IMPRESSION: 1. Numerous bone marrow lesions throughout all visualized levels, but worst at T5, T6 and T9. 2. 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 The T6 lesion severely narrows the thecal sac and deforms the spinal cord. 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 3. Pathologic fractures of T6 and T9 with 3-5 mm of retropulsion. Electronically Signed   By: KUlyses JarredM.D.   On: 02/14/2020 19:14   IR Fluoro Guide CV Line Right  Result Date: 02/17/2020 CLINICAL DATA:  Renal failure and need for tunneled hemodialysis catheter to begin hemodialysis. EXAM:  TUNNELED CENTRAL VENOUS HEMODIALYSIS CATHETER PLACEMENT WITH ULTRASOUND AND FLUOROSCOPIC GUIDANCE  ANESTHESIA/SEDATION: 1.0 mg IV Versed; 50 mcg IV Fentanyl. Total Moderate Sedation Time:   23 minutes. The patient's level of consciousness and physiologic status were continuously monitored during the procedure by Radiology nursing. MEDICATIONS: 2 g IV Ancef. FLUOROSCOPY TIME:  48 seconds. 4.0 mGy. PROCEDURE: The procedure, risks, benefits, and alternatives were explained to the patient. Questions regarding the procedure were encouraged and answered. The patient understands and consents to the procedure. A timeout was performed prior to initiating the procedure. The right neck and chest were prepped with chlorhexidine in a sterile fashion, and a sterile drape was applied covering the operative field. Maximum barrier sterile technique with sterile gowns and gloves were used for the procedure. Local anesthesia was provided with 1% lidocaine. Ultrasound was used to confirm patency of the right internal jugular vein. After creating a small venotomy incision, a 21 gauge needle was advanced into the right internal jugular vein under direct, real-time ultrasound guidance. Ultrasound image documentation was performed. After securing guidewire access, an 8 Fr dilator was placed. A J-wire was kinked to measure appropriate catheter length. A Palindrome tunneled hemodialysis catheter measuring 19 cm from tip to cuff was chosen for placement. This was tunneled in a retrograde fashion from the chest wall to the venotomy incision. At the venotomy, serial dilatation was performed and a 15 Fr peel-away sheath was placed over a guidewire. The catheter was then placed through the sheath and the sheath removed. Final catheter positioning was confirmed and documented with a fluoroscopic spot image. The catheter was aspirated, flushed with saline, and injected with appropriate volume heparin dwells. The venotomy incision was closed with subcuticular 4-0 Vicryl. Dermabond was applied to the incision. The catheter exit site was secured  with 0-Prolene retention sutures. COMPLICATIONS: None.  No pneumothorax. FINDINGS: After catheter placement, the tip lies in the right atrium. The catheter aspirates normally and is ready for immediate use. IMPRESSION: Placement of tunneled hemodialysis catheter via the right internal jugular vein. The catheter tip lies in the right atrium. The catheter is ready for immediate use. Electronically Signed   By: Aletta Edouard M.D.   On: 02/17/2020 17:22   IR US Guide Vasc Access Right  Result Date: 02/17/2020 CLINICAL DATA:  Renal failure and need for tunneled hemodialysis catheter to begin hemodialysis. EXAM: TUNNELED CENTRAL VENOUS HEMODIALYSIS CATHETER PLACEMENT WITH ULTRASOUND AND FLUOROSCOPIC GUIDANCE ANESTHESIA/SEDATION: 1.0 mg IV Versed; 50 mcg IV Fentanyl. Total Moderate Sedation Time:   23 minutes. The patient's level of consciousness and physiologic status were continuously monitored during the procedure by Radiology nursing. MEDICATIONS: 2 g IV Ancef. FLUOROSCOPY TIME:  48 seconds. 4.0 mGy. PROCEDURE: The procedure, risks, benefits, and alternatives were explained to the patient. Questions regarding the procedure were encouraged and answered. The patient understands and consents to the procedure. A timeout was performed prior to initiating the procedure. The right neck and chest were prepped with chlorhexidine in a sterile fashion, and a sterile drape was applied covering the operative field. Maximum barrier sterile technique with sterile gowns and gloves were used for the procedure. Local anesthesia was provided with 1% lidocaine. Ultrasound was used to confirm patency of the right internal jugular vein. After creating a small venotomy incision, a 21 gauge needle was advanced into the right internal jugular vein under direct, real-time ultrasound guidance. Ultrasound image documentation was performed. After securing guidewire access, an 8 Fr dilator was placed. A J-wire was kinked to measure  appropriate catheter length. A Palindrome tunneled hemodialysis catheter measuring 19 cm from tip to cuff was chosen for placement. This was tunneled in a retrograde fashion from the chest wall to the venotomy incision. At the venotomy, serial dilatation was performed and a 15 Fr peel-away sheath was placed over a guidewire. The catheter was then placed through the sheath and the sheath removed. Final catheter positioning was confirmed and documented with a fluoroscopic spot image. The catheter was aspirated, flushed with saline, and injected with appropriate volume heparin dwells. The venotomy incision was closed with subcuticular 4-0 Vicryl. Dermabond was applied to the incision. The catheter exit site was secured with 0-Prolene retention sutures. COMPLICATIONS: None.  No pneumothorax. FINDINGS: After catheter placement, the tip lies in the right atrium. The catheter aspirates normally and is ready for immediate use. IMPRESSION: Placement of tunneled hemodialysis catheter via the right internal jugular vein. The catheter tip lies in the right atrium. The catheter is ready for immediate use. Electronically Signed   By: Aletta Edouard M.D.   On: 02/17/2020 17:22    ASSESSMENT & PLAN Trevor Shelton 54 y.o. male with medical history significant for HTN and HLD who presented with back pain and was found to have cord compression and lytic lesions concerning for multiple myeloma.  At this time the findings on this patient's imaging and blood work are most concerning for multiple myeloma.  Interestingly the M spike and the serum free light chain ratio is relatively modest compared to the extent of the damage seen on CT scan.  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  Fortunately plasmacytomas are exquisitely sensitive to radiation therapy and therefore I am hoping for a good response from the  radiation he is receiving to the spine.  In the event the patient is confirmed to have multiple myeloma I would recommend we proceed with CyBorD chemotherapy which can be administered in the outpatient setting once he is stabilized.  In the event it looks like the patient is not going to have a timely discharge we could consider administration of cycle 1 while he is in house.  SPEP 02/15/2020: M protein 0.9 SFLC: kappa 37.5, Lambda 9.9, ratio 3.79 UPEP: pending    # Lytic Lesions Througout Skeleton/Hypercalcemia # Pathologic Fractures of Spine/Soft Tissue Masses in Spine #Concern for Multiple Myeloma --bone marrow biopsy scheduled to be performed tomorrow in order to help confirm the diagnosis --M protein and Kappa ratio are modestly elevated compared the the extent of damage seen on CT/MRI. 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 Hypercalcemia treatment per primary team. --once diagnosis is confirmed I would recommend we proceed with CyBorD chemotherapy ( in the setting of his poor kidney function would avoid revlimid at this time). This can be administered in the outpatient setting once he was stabilized from a cord compression/hypercalcemia standpoint, however if it appears as though the patient is going to have an extended inpatient admission we can arrange for the first cycle to be administered in house. --Hematology will continue to follow while he is inpatient.   No orders of the defined types were placed in this encounter.   All questions were answered. The patient knows to call the clinic with any problems, questions or concerns.  A total of more than 60 minutes were spent on  this encounter and over half of that time was spent on counseling and coordination of care as outlined above.   Ledell Peoples, MD Department of Hematology/Oncology [facility] Phone: [phone] Pager: [phone] Email: [email or web address]  02/18/2020 6:58 PM

## 2020-02-18 NOTE — Progress Notes (Signed)
Initial Nutrition Assessment  RD working remotely.  DOCUMENTATION CODES:   Obesity unspecified  INTERVENTION:   -Ensure Enlive po BID, each supplement provides 350 kcal and 20 grams of protein  NUTRITION DIAGNOSIS:   Increased nutrient needs related to cancer and cancer related treatments as evidenced by estimated needs.  GOAL:   Patient will meet greater than or equal to 90% of their needs  MONITOR:   Supplement acceptance, PO intake, Diet advancement, Labs, Weight trends, Skin, I & O's  REASON FOR ASSESSMENT:   Malnutrition Screening Tool    ASSESSMENT:   Trevor Shelton is a 54 y.o. male with no significant past medical history presents to emergency department with generalized weakness, left rib pain, lower back pain, constipation since 1 month.  Pt admitted with hypercalcemia with T6, T9 pathological fracture and severe AKI.   8/11- sp tunneled HD cath placement, first HD treatment  Reviewed I/O's: -900 ml x 24 hours and +4.9 L since admission  Attempted to speak with pt via phone call to hospital room phone, however, no answer.   Pt started HD on 02/17/20. Nephrology hopeful for temporary HD, due to uremia after HD treatment.   Per oncology notes, pt with multiple myeloma with impending cord compression and painful metastatic lesions in the spine and ribs. Plan for radiation planning and treatment today. Bone marrow biopsy scheduled for 02/19/20 due to radiation.  Per neurosurgery notes, anticipate back pain will help with radiation therapy. They are also recommending vertebroplasty or kyphoplasty by IR if back pain persists after radiation.   Pt with fair appetite; noted meal completion 50-90%.  Reviewed wt hx; not wt loss this month per review of records.   Medications reviewed and include colace, senna, and miralax.   Labs reviewed: Mg, K, and Phos WDL.   Diet Order:   Diet Order            Diet NPO time specified Except for: Sips with Meds  Diet effective  midnight           Diet regular Room service appropriate? Yes; Fluid consistency: Thin  Diet effective now                 EDUCATION NEEDS:   No education needs have been identified at this time  Skin:  Skin Assessment: Reviewed RN Assessment  Last BM:  02/18/20  Height:   Ht Readings from Last 1 Encounters:  02/14/20 _0  (1.6 m)    Weight:   Wt Readings from Last 1 Encounters:  02/18/20 77.9 kg    Ideal Body Weight:  56.4 kg  BMI:  Body mass index is 30.42 kg/m.  Estimated Nutritional Needs:   Kcal:  1610-9604  Protein:  85-100 grams  Fluid:  1000 ml+ UOP    Loistine Chance, RD, LDN, [place] Registered Dietitian II Certified Diabetes Care and Education Specialist Please refer to [facility] for RD and/or RD on-call/weekend/after hours pager

## 2020-02-18 NOTE — Progress Notes (Signed)
Subjective: Patient reports his left leg strength is slightly improved from yesterday.  Overall, no change in past 1-2 weeks  Objective: Vital signs in last 24 hours: Temp:  [97.6 F (36.4 C)-99.4 F (37.4 C)] 97.6 F (36.4 C) (08/12 0946) Pulse Rate:  [76-94] 78 (08/12 0946) Resp:  [14-28] 18 (08/12 0946) BP: (116-179)/(47-92) 151/81 (08/12 0946) SpO2:  [87 %-97 %] 97 % (08/12 0946) Weight:  [75.1 kg-77.9 kg] 77.9 kg (08/12 0502)  Intake/Output from previous day: 08/11 0701 - 08/12 0700 In: -  Out: 900 [Urine:800; Emesis/NG output:100] Intake/Output this shift: No intake/output data recorded.  A+Ox3 Neck supple 5/5 strength in UEs RLE 5/5 HF, KE, DF, PF. LLE 3/5 HF, KE, DF, 4-/5 PF Decreased fine touch in LLE, abdominal wall.  Some dysesthetic pain around bilateral lower and middle rib cage.  Lab Results: Recent Labs    02/17/20 1800 02/18/20 0250  WBC 10.2 9.1  HGB 11.3* 10.7*  HCT 32.7* 30.8*  PLT 182 178   BMET Recent Labs    02/17/20 1800 02/18/20 0250  NA 129* 135  K 4.2 4.1  CL 97* 101  CO2 22 26  GLUCOSE 95 102*  BUN 65* 36*  CREATININE 4.68* 3.57*  CALCIUM 14.0* 11.1*    Studies/Results: IR Fluoro Guide CV Line Right  Result Date: 02/17/2020 CLINICAL DATA:  Renal failure and need for tunneled hemodialysis catheter to begin hemodialysis. EXAM: TUNNELED CENTRAL VENOUS HEMODIALYSIS CATHETER PLACEMENT WITH ULTRASOUND AND FLUOROSCOPIC GUIDANCE ANESTHESIA/SEDATION: 1.0 mg IV Versed; 50 mcg IV Fentanyl. Total Moderate Sedation Time:   23 minutes. The patient's level of consciousness and physiologic status were continuously monitored during the procedure by Radiology nursing. MEDICATIONS: 2 g IV Ancef. FLUOROSCOPY TIME:  48 seconds. 4.0 mGy. PROCEDURE: The procedure, risks, benefits, and alternatives were explained to the patient. Questions regarding the procedure were encouraged and answered. The patient understands and consents to the procedure. A timeout  was performed prior to initiating the procedure. The right neck and chest were prepped with chlorhexidine in a sterile fashion, and a sterile drape was applied covering the operative field. Maximum barrier sterile technique with sterile gowns and gloves were used for the procedure. Local anesthesia was provided with 1% lidocaine. Ultrasound was used to confirm patency of the right internal jugular vein. After creating a small venotomy incision, a 21 gauge needle was advanced into the right internal jugular vein under direct, real-time ultrasound guidance. Ultrasound image documentation was performed. After securing guidewire access, an 8 Fr dilator was placed. A J-wire was kinked to measure appropriate catheter length. A Palindrome tunneled hemodialysis catheter measuring 19 cm from tip to cuff was chosen for placement. This was tunneled in a retrograde fashion from the chest wall to the venotomy incision. At the venotomy, serial dilatation was performed and a 15 Fr peel-away sheath was placed over a guidewire. The catheter was then placed through the sheath and the sheath removed. Final catheter positioning was confirmed and documented with a fluoroscopic spot image. The catheter was aspirated, flushed with saline, and injected with appropriate volume heparin dwells. The venotomy incision was closed with subcuticular 4-0 Vicryl. Dermabond was applied to the incision. The catheter exit site was secured with 0-Prolene retention sutures. COMPLICATIONS: None.  No pneumothorax. FINDINGS: After catheter placement, the tip lies in the right atrium. The catheter aspirates normally and is ready for immediate use. IMPRESSION: Placement of tunneled hemodialysis catheter via the right internal jugular vein. The catheter tip lies in the right atrium.  The catheter is ready for immediate use. Electronically Signed   By: Aletta Edouard M.D.   On: 02/17/2020 17:22   IR US Guide Vasc Access Right  Result Date:  02/17/2020 CLINICAL DATA:  Renal failure and need for tunneled hemodialysis catheter to begin hemodialysis. EXAM: TUNNELED CENTRAL VENOUS HEMODIALYSIS CATHETER PLACEMENT WITH ULTRASOUND AND FLUOROSCOPIC GUIDANCE ANESTHESIA/SEDATION: 1.0 mg IV Versed; 50 mcg IV Fentanyl. Total Moderate Sedation Time:   23 minutes. The patient's level of consciousness and physiologic status were continuously monitored during the procedure by Radiology nursing. MEDICATIONS: 2 g IV Ancef. FLUOROSCOPY TIME:  48 seconds. 4.0 mGy. PROCEDURE: The procedure, risks, benefits, and alternatives were explained to the patient. Questions regarding the procedure were encouraged and answered. The patient understands and consents to the procedure. A timeout was performed prior to initiating the procedure. The right neck and chest were prepped with chlorhexidine in a sterile fashion, and a sterile drape was applied covering the operative field. Maximum barrier sterile technique with sterile gowns and gloves were used for the procedure. Local anesthesia was provided with 1% lidocaine. Ultrasound was used to confirm patency of the right internal jugular vein. After creating a small venotomy incision, a 21 gauge needle was advanced into the right internal jugular vein under direct, real-time ultrasound guidance. Ultrasound image documentation was performed. After securing guidewire access, an 8 Fr dilator was placed. A J-wire was kinked to measure appropriate catheter length. A Palindrome tunneled hemodialysis catheter measuring 19 cm from tip to cuff was chosen for placement. This was tunneled in a retrograde fashion from the chest wall to the venotomy incision. At the venotomy, serial dilatation was performed and a 15 Fr peel-away sheath was placed over a guidewire. The catheter was then placed through the sheath and the sheath removed. Final catheter positioning was confirmed and documented with a fluoroscopic spot image. The catheter was aspirated,  flushed with saline, and injected with appropriate volume heparin dwells. The venotomy incision was closed with subcuticular 4-0 Vicryl. Dermabond was applied to the incision. The catheter exit site was secured with 0-Prolene retention sutures. COMPLICATIONS: None.  No pneumothorax. FINDINGS: After catheter placement, the tip lies in the right atrium. The catheter aspirates normally and is ready for immediate use. IMPRESSION: Placement of tunneled hemodialysis catheter via the right internal jugular vein. The catheter tip lies in the right atrium. The catheter is ready for immediate use. Electronically Signed   By: Aletta Edouard M.D.   On: 02/17/2020 17:22    Assessment/Plan: 54 yo M with newly diagnosed multiple myeloma including T6 and T9 pathologic compression fractures with epidural tumor, cord compression at T6. - patient has been simulated for XRT this morning, plan for treatment this afternoon - can reduce dexamethasone to 4 q8 on 8/16, continue dexamethasone through conclusion of radiation, then can taper. - if has persistent mechanical upper mid back pain after radiation, he may be a candidate for palliative kyphoplasty (can be performed by interventional radiology) - I discussed the plan of care with the patient and wife in [place] 02/18/2020, 10:18 AM

## 2020-02-18 NOTE — Progress Notes (Signed)
PROGRESS NOTE    Trevor Shelton  LNL:[id number] DOB: 1965-11-21 DOA: 02/14/2020 PCP: Patient, No Pcp Per   Brief Narrative:  The patient is a 54 year old Spanish-speaking male with no real past medical history who presented to the ED with generalized weakness, left rib pain, lower back pain and constipation for a month.  Patient son was at bedside during admission and told most of the story.  Patient fell off a ladder from a height of about 4 feet about a month ago and landed on his left side and since then he started having back pain, left rib pain and weakness in bilateral extremities which is subsequently getting worse.  He presented to the ED and had a work-up and was found to have an elevated WBC of 13.3.  Is also noted to have sodium 125, severe AKI, calcium more than 15,.  CT of the abdomen pelvis showed diffuse lytic lesions throughout the skeleton consistent with multiple myeloma or metastatic disease.  He also was noted to have pathological fractures at T5 and T9, and acute fracture left rib 9 and 10, the EDP consulted neurosurgery and oncology.  Oncology was recommended to check SPEP and UPEP and tried hospitalist will admit this patient for hypercalcemia and AKI with suspected multiple myeloma.  Neurosurgery spoke with the EDP and then felt from a neurosurgical standpoint in the setting of multiple myeloma, there are few surgical indications and management was typically radiation.  They felt that they could explore the possibility of kyphoplasty.  At the time of admission medical oncology was consulted and they felt that patient's diagnosis of multiple myeloma was not confirmed and they felt that he did not need to be transferred to [facility].  Medical oncology recommended SPEP and UPEP to confirm diagnosis of multiple myeloma.  Patient does have an M Spike and Medical Oncology was consulted for further evaluation and recommendations.  Neurosurgery recommended the patient get treated with IV  dexamethasone and recommended radiation oncology evaluated.  Radiation oncology was consulted and patient went for simulation and underwent his first radiation treatment today.  He is pending a bone marrow biopsy and this will be done tomorrow and then patient will be dialyzed again later.  Biopsy will be done to help confirm the diagnosis of multiple myeloma as SPEP showed M protein of 0.9, and serum fixation light chains and UPEP is still pending.  Medical oncology agrees with radiation to the spine given the tumor extensions at T6 and T9 has a bone marrow biopsy cannot confirm diagnosis recommend biopsy of the soft tissue spinal mass.  Patient was started on bisphosphonate therapy and is continue to be hydrated.  Neurosurgery is recommending reducing dexamethasone 4 mg every 8 hours on 02/22/2020 and continue dexamethasone through the conclusion of his radiation and can taper.  Patient's renal function is slightly improved from yesterday during his hemodialysis session.  If no light chains in the urine then myeloma kidney is less likely per nephrology.  We will continue to hydrate per nephrology recommendations  Assessment & Plan:   Principal Problem:   Hypercalcemia Active Problems:   AKI (acute kidney injury) ([place])   Pathologic fracture   Hyponatremia   Leukocytosis   Kappa light chain myeloma (HCC)  Hypercalcemia withT6, T9pathological fracture and severeAKI; concern for Multiple Myeloma -Likely secondary to multiple myeloma but we do not have a confirmed diagnosis and he will be going for a bone marrow biopsy tomorrow. -Reviewed CT chest, abdomen/pelvis result.  -MRI thoracic spine showed "  Numerous bone marrow lesions throughout all visualized levels, but worst at T5, T6 and T9. 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 The T6 lesion severely narrows the thecal sac and deforms the spinal cord. Hyperintense T2-weighted signal in the spinal cord at the T5 level is consistent with compressive  myelopathy. Pathologic fractures of T6 and T9 with 3-5 mm of retropulsion." ;  Patient continues to have some good strength in his legs but complains of them hurting. -Neuro-surgery evaluated initially and from a neurosurgical standpoint they do not feel that he is a surgical indication and they feel the management is typically radiation.  Dr. Marcello Moores did advise that if the patient is a good candidate for kyphoplasty this could be explored. Will discuss the case again with Dr. Marcello Moores and Dr. Marcello Moores evaluated and he feels that neurosurgical intervention is not necessary at this time and recommend starting the patient on IV dexamethasone 6 mg every 6 hours and then tapering down to 4 mg every 8 hours on 02/22/2020 and continuing the dexamethasone through the duration of radiation.  He also recommended radiation oncology be involved and patient was simulated and had his first radiation treatment today. -Patient received IV fluid and Dilaudid in the ED. -Admitted patient on the floor.  -EDP consulted neurosurgery and oncology -OncologyDr. Shadad-recommended to check UPEP and SPEP-to confirm multiple myeloma and oncology will get involved at that point. Patient has an M Spike of 0.9 Total Protein of 5.8, and Kappa/Lambda Light chains are now showing an elevation as his kappa free light chains with a value of 37.5 and a lambda free light chain of 9.9 and a kappa/lambda light chain ratio of 3.79.  Likely this is a kappa free light chain multiple myeloma and his UPEP is still pending -C/w Antiemetic, pain control -Nephrology following- concern for myeloma and he underwent dialysis yesterday given uremic symptoms and is scheduled for dialysis again tomorrow or the day after.   -Continue with aggressive IV fluid for hydration with isotonic fluids  -Started on calcitonin and getting 290 units sq BID x2 Doses  -Given renal function nephrology will give pamidronate 30 mg IV x1 -PTH is low consistent which rules out  primary hyperparathyroid; appropriate response for level of hypercalcemia. -Lidocaine patch for rib pain -Ca2+ went from >15.0 -> 13.5 -> 14.1 and his calcium today was 11.1 (non-corrected) -Medical Oncology consulted for further evaluation and recommends and bone marrow biopsy is still pending to be done -Appreciate multidisciplinary involvement of medical oncology, radiation oncology, neurosurgery, interventional radiology, nephrology and will follow up on specialist recommendations  AKI, slightly improved with dialysis -Likely in the setting of underlying multiple myeloma prerenal azotemia +/- ATN -Nephrology following and feels likely in the setting of multiple myeloma -Avoid nephrotoxic medications -Patient having Uremic Symptoms so Nephrology will initiate HD and he had his first dialysis session 02/17/2020 -Continue IV fluids for hydration (Getting NS at 150 mL/hr), strict I's and O's, daily weight, close monitoring urinary output and respiratory status -Nephrology feels that TPE may be true given that he has had symptoms for over a month and oncology does not feel it is likely necessary at this point given that his protein is not that high. -Check bladder scan to make sure patient is not retaining with all aggressive IV patient -His BUN/creatinine went from 65/4.6 is now 36/3.57  Hyponatremia -Sodium of 125 on admission. -Today sodium 135 and corrected in the setting of fluid resuscitation and dialysis -Was started on IVF. Per nephrology likely  in the event of pseudo from paraproteinemia. -We will check urine and serum osmolality, TSH, cortisol -Serum osmolality is high likely representing the paraprotein per Nephrology recc's  -Nephrology following and appreciate assistance   Leukocytosis -Likely reactive.  Improving slowly and now trended up -WBC went from 13.3 -> 11.6 -> 10.9 -> 11.1 and today is trending down is 9.1 -Afebrile, no indication for antibiotics as there is no  evidence of infection  -Continue to monitor and repeat CBC in the AM  Constipation -Likely secondary to hypercalcemia due to underlying possible multiple myeloma -Bowel regimen was adjusted today and will be continued -Continue IV fluids. -Mobility as tolerated  Normocytic Anemia -Hgb/Hct went from 14.2/41.2 -> 10.9/31.6 and today his hemoglobin/hematocrit is 10.7/30.8 -? Dilutional Drop -Check Anemia Panel and showed an iron level of 38, U IBC 200, TIBC of 238, saturation ratios of 16%, ferritin level 14, folate level 11.7, vitamin B12 level 413; Continue to Monitor for S/Sx of Bleeding -Currently no overt bleeding noted -Repeat CBC in the AM  Hyperglycemia in the setting of new onset type 2 diabetes mellitus -Check HbA1c in the AM and his hemoglobin A1c was 6.5 -Patient's Blood Sugars have been ranging from 116-158 on daily BMP/CMP's; expect blood sugars to elevate in the setting of steroid demargination -Continue to Monitor closely and if necessary will place on Sensitive Novolog SSI AC and placed on sliding scale  DVT prophylaxis:  Code Status: FULL CODE  Family Communication: Discussed with family present at bedside  Disposition Plan: Pending further evaluation by Medical Oncology and clearance by Nephrology  Status is: Inpatient  Remains inpatient appropriate because:Ongoing diagnostic testing needed not appropriate for outpatient work up, Unsafe d/c plan, IV treatments appropriate due to intensity of illness or inability to take PO and Inpatient level of care appropriate due to severity of illness   Dispo: The patient is from: Home              Anticipated d/c is to: TBD              Anticipated d/c date is: 2 days              Patient currently is not medically stable to d/c.  Consultants:   Nephrology  Medical Oncology   IR  Procedures: [facility] placement with HD today  Antimicrobials:  Anti-infectives (From admission, onward)   Start     Dose/Rate Route  Frequency Ordered Stop   02/17/20 0930  ceFAZolin (ANCEF) IVPB 2g/100 mL premix        2 g 200 mL/hr over 30 Minutes Intravenous To Radiology 02/17/20 0851 02/17/20 1034     Subjective: Seen and examined at bedside with the interpreter [facility] #[id number] and the patient states that he still not had a bowel movement yet and still having trouble urinating.  He also complains of lower extremity weakness and states it has not improved much and states that his left leg is weaker than his right.  No nausea or vomiting today and states he got nauseous on his first right eye was well but states that his nausea is improved.  No chest pain, lightheadedness or dizziness.  No other concerns or complaints at this time and understands that he will go for radiation again later this afternoon.  Medical oncology is going to see him at [facility] and he is to undergo bone marrow biopsy tomorrow and likely dialysis after.  Objective: Vitals:   02/18/20 0502 02/18/20 0946 02/18/20 1100 02/18/20  1624  BP: 137/78 (!) 151/81 126/65 (!) 159/86  Pulse: 86 78  80  Resp: _0 Temp: 99.4 F (37.4 C) 97.6 F (36.4 C)  97.6 F (36.4 C)  TempSrc: Oral     SpO2: 95% 97%  100%  Weight: 77.9 kg     Height:        Intake/Output Summary (Last 24 hours) at 02/18/2020 1844 Last data filed at 02/18/2020 1300 Gross per 24 hour  Intake --  Output 950 ml  Net -950 ml   Filed Weights   02/17/20 1804 02/17/20 2130 02/18/20 0502  Weight: 75.4 kg 75.1 kg 77.9 kg   Examination: Physical Exam:  Constitutional: WN/WD obese Spanish-speaking male currently in no acute distress appears calm but does appear uncomfortable slightly Eyes: Lids and conjunctivae normal, sclerae anicteric  ENMT: External Ears, Nose appear normal. Grossly normal hearing.  Neck: Appears normal, supple, no cervical masses, normal ROM, no appreciable thyromegaly; no JVD Respiratory: Diminished to auscultation bilaterally, no wheezing, rales,  rhonchi or crackles. Normal respiratory effort and patient is not tachypenic. No accessory muscle use.  Unlabored breathing Cardiovascular: RRR, no murmurs / rubs / gallops. S1 and S2 auscultated.  Trace extremity edema Abdomen: Soft, non-tender, non-distended. No masses palpated. No appreciable hepatosplenomegaly. Bowel sounds positive.  GU: Deferred. Musculoskeletal: No clubbing / cyanosis of digits/nails. No joint deformity upper and lower extremities.  He has diminished strength in his left side compared his right.  Skin: No rashes, lesions, ulcers on limited skin evaluation. No induration; Warm and dry.  Neurologic: CN 2-12 grossly intact with no focal deficits. Romberg sign and cerebellar reflexes not assessed.  Psychiatric: Normal judgment and insight. Alert and oriented x 3. Normal mood and appropriate affect.   Data Reviewed: I have personally reviewed following labs and imaging studies  CBC: Recent Labs  Lab 02/15/20 0202 02/16/20 0028 02/17/20 0102 02/17/20 1800 02/18/20 0250  WBC 11.6* 10.9* 11.1* 10.2 9.1  NEUTROABS  --   --   --   --  7.9*  HGB 12.8* 11.5* 10.9* 11.3* 10.7*  HCT 37.0* 33.0* 31.6* 32.7* 30.8*  MCV 83.7 84.6 84.3 85.6 85.8  PLT 174 173 181 182 624   Basic Metabolic Panel: Recent Labs  Lab 02/15/20 0202 02/16/20 0028 02/17/20 0102 02/17/20 1800 02/18/20 0250  NA 130* 128* 129* 129* 135  K 4.9 4.5 4.6 4.2 4.1  CL 89* 93* 96* 97* 101  CO2 32 _1 GLUCOSE 116* 116* 123* 95 102*  BUN 81* 76* 67* 65* 36*  CREATININE 4.92* 4.53* 4.74* 4.68* 3.57*  CALCIUM >15.0* 13.5* 14.1* 14.0* 11.1*  MG 1.8  --   --   --  1.7  PHOS 6.3*  --  5.5* 5.3* 3.0   GFR: Estimated Creatinine Clearance: 21.3 mL/min (A) (by C-G formula based on SCr of 3.57 mg/dL (H)). Liver Function Tests: Recent Labs  Lab 02/14/20 1252 02/15/20 0202 02/17/20 1800 02/18/20 0250  AST 34 28  --  29  ALT 42 41  --  25  ALKPHOS 66 58  --  55  BILITOT 1.3* 0.7  --  0.5  PROT  6.8 6.2*  --  5.4*  ALBUMIN 3.3* 2.9* 2.4* 2.3*   Recent Labs  Lab 02/14/20 1252  LIPASE 28   No results for input(s): AMMONIA in the last 168 hours. Coagulation Profile: Recent Labs  Lab 02/15/20 1150 02/18/20 1132  INR 1.1 1.1   Cardiac Enzymes: No  results for input(s): CKTOTAL, CKMB, CKMBINDEX, TROPONINI in the last 168 hours. BNP (last 3 results) No results for input(s): PROBNP in the last 8760 hours. HbA1C: Recent Labs    02/18/20 0250  HGBA1C 6.5*   CBG: No results for input(s): GLUCAP in the last 168 hours. Lipid Profile: No results for input(s): CHOL, HDL, LDLCALC, TRIG, CHOLHDL, LDLDIRECT in the last 72 hours. Thyroid Function Tests: Recent Labs    02/15/20 1852  TSH 0.405   Anemia Panel: Recent Labs    02/18/20 0250  VITAMINB12 413  FOLATE 11.7  FERRITIN 410*  TIBC 238*  IRON 38*  RETICCTPCT 1.1   Sepsis Labs: No results for input(s): PROCALCITON, LATICACIDVEN in the last 168 hours.  Recent Results (from the past 240 hour(s))  SARS Coronavirus 2 by RT PCR (hospital order, performed in [facility] hospital lab) Nasopharyngeal Nasopharyngeal Swab     Status: None   Collection Time: 02/14/20  4:05 PM   Specimen: Nasopharyngeal Swab  Result Value Ref Range Status   SARS Coronavirus 2 NEGATIVE NEGATIVE Final    Comment: (NOTE) SARS-CoV-2 target nucleic acids are NOT DETECTED.  The SARS-CoV-2 RNA is generally detectable in upper and lower respiratory specimens during the acute phase of infection. The lowest concentration of SARS-CoV-2 viral copies this assay can detect is 250 copies / mL. A negative result does not preclude SARS-CoV-2 infection and should not be used as the sole basis for treatment or other patient management decisions.  A negative result may occur with improper specimen collection / handling, submission of specimen other than nasopharyngeal swab, presence of viral mutation(s) within the areas targeted by this assay, and  inadequate number of viral copies (<250 copies / mL). A negative result must be combined with clinical observations, patient history, and epidemiological information.  Fact Sheet for Patients:   StrictlyIdeas.no  Fact Sheet for Healthcare Providers: BankingDealers.co.za  This test is not yet approved or  cleared by the [place] FDA and has been authorized for detection and/or diagnosis of SARS-CoV-2 by FDA under an Emergency Use Authorization (EUA).  This EUA will remain in effect (meaning this test can be used) for the duration of the COVID-19 declaration under Section 564(b)(1) of the Act, 21 U.S.C. section 360bbb-3(b)(1), unless the authorization is terminated or revoked sooner.  Performed at [facility], [place] [address], [place], [place] [address]     RN Pressure Injury Documentation:     Estimated body mass index is 30.42 kg/m as calculated from the following:   Height as of this encounter: _0  (1.6 m).   Weight as of this encounter: 77.9 kg.  Malnutrition Type:  Nutrition Problem: Increased nutrient needs Etiology: cancer and cancer related treatments   Malnutrition Characteristics:  Signs/Symptoms: estimated needs   Nutrition Interventions:  Interventions: Ensure Enlive (each supplement provides 350kcal and 20 grams of protein)   Radiology Studies: IR Fluoro Guide CV Line Right  Result Date: 02/17/2020 CLINICAL DATA:  Renal failure and need for tunneled hemodialysis catheter to begin hemodialysis. EXAM: TUNNELED CENTRAL VENOUS HEMODIALYSIS CATHETER PLACEMENT WITH ULTRASOUND AND FLUOROSCOPIC GUIDANCE ANESTHESIA/SEDATION: 1.0 mg IV Versed; 50 mcg IV Fentanyl. Total Moderate Sedation Time:   23 minutes. The patient's level of consciousness and physiologic status were continuously monitored during the procedure by Radiology nursing. MEDICATIONS: 2 g IV Ancef. FLUOROSCOPY TIME:  48 seconds. 4.0 mGy.  PROCEDURE: The procedure, risks, benefits, and alternatives were explained to the patient. Questions regarding the procedure were encouraged and answered.  The patient understands and consents to the procedure. A timeout was performed prior to initiating the procedure. The right neck and chest were prepped with chlorhexidine in a sterile fashion, and a sterile drape was applied covering the operative field. Maximum barrier sterile technique with sterile gowns and gloves were used for the procedure. Local anesthesia was provided with 1% lidocaine. Ultrasound was used to confirm patency of the right internal jugular vein. After creating a small venotomy incision, a 21 gauge needle was advanced into the right internal jugular vein under direct, real-time ultrasound guidance. Ultrasound image documentation was performed. After securing guidewire access, an 8 Fr dilator was placed. A J-wire was kinked to measure appropriate catheter length. A Palindrome tunneled hemodialysis catheter measuring 19 cm from tip to cuff was chosen for placement. This was tunneled in a retrograde fashion from the chest wall to the venotomy incision. At the venotomy, serial dilatation was performed and a 15 Fr peel-away sheath was placed over a guidewire. The catheter was then placed through the sheath and the sheath removed. Final catheter positioning was confirmed and documented with a fluoroscopic spot image. The catheter was aspirated, flushed with saline, and injected with appropriate volume heparin dwells. The venotomy incision was closed with subcuticular 4-0 Vicryl. Dermabond was applied to the incision. The catheter exit site was secured with 0-Prolene retention sutures. COMPLICATIONS: None.  No pneumothorax. FINDINGS: After catheter placement, the tip lies in the right atrium. The catheter aspirates normally and is ready for immediate use. IMPRESSION: Placement of tunneled hemodialysis catheter via the right internal jugular vein. The  catheter tip lies in the right atrium. The catheter is ready for immediate use. Electronically Signed   By: Aletta Edouard M.D.   On: 02/17/2020 17:22   IR US Guide Vasc Access Right  Result Date: 02/17/2020 CLINICAL DATA:  Renal failure and need for tunneled hemodialysis catheter to begin hemodialysis. EXAM: TUNNELED CENTRAL VENOUS HEMODIALYSIS CATHETER PLACEMENT WITH ULTRASOUND AND FLUOROSCOPIC GUIDANCE ANESTHESIA/SEDATION: 1.0 mg IV Versed; 50 mcg IV Fentanyl. Total Moderate Sedation Time:   23 minutes. The patient's level of consciousness and physiologic status were continuously monitored during the procedure by Radiology nursing. MEDICATIONS: 2 g IV Ancef. FLUOROSCOPY TIME:  48 seconds. 4.0 mGy. PROCEDURE: The procedure, risks, benefits, and alternatives were explained to the patient. Questions regarding the procedure were encouraged and answered. The patient understands and consents to the procedure. A timeout was performed prior to initiating the procedure. The right neck and chest were prepped with chlorhexidine in a sterile fashion, and a sterile drape was applied covering the operative field. Maximum barrier sterile technique with sterile gowns and gloves were used for the procedure. Local anesthesia was provided with 1% lidocaine. Ultrasound was used to confirm patency of the right internal jugular vein. After creating a small venotomy incision, a 21 gauge needle was advanced into the right internal jugular vein under direct, real-time ultrasound guidance. Ultrasound image documentation was performed. After securing guidewire access, an 8 Fr dilator was placed. A J-wire was kinked to measure appropriate catheter length. A Palindrome tunneled hemodialysis catheter measuring 19 cm from tip to cuff was chosen for placement. This was tunneled in a retrograde fashion from the chest wall to the venotomy incision. At the venotomy, serial dilatation was performed and a 15 Fr peel-away sheath was placed over  a guidewire. The catheter was then placed through the sheath and the sheath removed. Final catheter positioning was confirmed and documented with a fluoroscopic spot image. The catheter  was aspirated, flushed with saline, and injected with appropriate volume heparin dwells. The venotomy incision was closed with subcuticular 4-0 Vicryl. Dermabond was applied to the incision. The catheter exit site was secured with 0-Prolene retention sutures. COMPLICATIONS: None.  No pneumothorax. FINDINGS: After catheter placement, the tip lies in the right atrium. The catheter aspirates normally and is ready for immediate use. IMPRESSION: Placement of tunneled hemodialysis catheter via the right internal jugular vein. The catheter tip lies in the right atrium. The catheter is ready for immediate use. Electronically Signed   By: Aletta Edouard M.D.   On: 02/17/2020 17:22    Scheduled Meds: . Chlorhexidine Gluconate Cloth  6 each Topical Q0600  . dexamethasone (DECADRON) injection  6 mg Intravenous Q6H  . docusate sodium  100 mg Oral Daily  . [START ON 02/19/2020] feeding supplement (ENSURE ENLIVE)  237 mL Oral BID BM  . heparin injection (subcutaneous)  5,000 Units Subcutaneous Q8H  . lidocaine  1 patch Transdermal Q24H  . pantoprazole (PROTONIX) IV  40 mg Intravenous Q24H  . polyethylene glycol  17 g Oral BID  . senna-docusate  1 tablet Oral BID  . sodium chloride flush  3 mL Intravenous Once   Continuous Infusions: . sodium chloride 150 mL/hr at 02/17/20 0558    LOS: 4 days   Kerney Elbe, DO [facility] PAGER is on [place]  If 7PM-7AM, please contact night-coverage [email or web address]

## 2020-02-18 NOTE — Progress Notes (Signed)
Brief Oncology Progress Note  S: [name] is a 54 year old male with no remarkable past medical history who presented with worsening back pain to the [facility] ED on 8/8/2021and was found to have severe AKI (Cr 4.73), hypercalcemia (>15), and pathologic fractures at T5 and T9 not on CT C/A/P but further detailed in an MRI later that day. The patient was admitted for further evaluation and management and oncology was consulted on concern for multiple myeloma.   Unable to connect with patient yesterday evening (dialysis?). Patient will be receiving radiation today at 9:00am and 2:00pm. Can attempt to connect with him then.   Recommendations: --IR scheduling patient for bone marrow biopsy to help confirm the diagnosis of MM. SPEP shows M protein 0.9, SFLC and UPEP pending --agree with radiation to the spine given these tumor extensions at T6 and T9 (soft tissue lesions, possible plasmacytomas?) --if bone marrow biopsy cannot confirm diagnosis will recommend biopsy of soft tissue spinal mass --patient has been started on bisphosphonate therapy (pamidronate 5m IV x 1)  --continue hydration and pain control per primary team --will make efforts to connect and speak with patient today.   JLedell Peoples MD Department of Hematology/Oncology [place] [facility] CenterPhone: [phone]: [phone]: [email or web address]

## 2020-02-18 NOTE — Progress Notes (Signed)
Referring Physician(s): Dorsey,J  Supervising Physician: Aletta Edouard  Patient Status:  [facility] - In-pt  Chief Complaint:  Back pain, weakness  Subjective: Pt familiar to IR service from HD cath placement on 8/11; he has a hx of back pain with rad to bilat ant/lat chest regions, severe AKI, elevated calcium levels, M spike on protein electrophoresis, numerous lytic bony lesions/path fx's T6/T9; request now received from oncology for bone marrow biopsy. He currently denies fever,HA, worsening dyspnea, cough, N/V or bleeding  Past Medical History:  Diagnosis Date  . AKI (acute kidney injury) ([place]) 02/2020  . Hypercalcemia 02/2020  . Hypercholesteremia   . Hypertension     Past Surgical History:  Procedure Laterality Date  . HERNIA REPAIR    . IR FLUORO GUIDE CV LINE RIGHT  02/17/2020  . IR US GUIDE VASC ACCESS RIGHT  02/17/2020       Allergies: Patient has no known allergies.  Medications: Prior to Admission medications   Medication Sig Start Date End Date Taking? Authorizing Provider  lisinopril-hydrochlorothiazide (ZESTORETIC) 20-25 MG tablet Take 1 tablet by mouth daily. 02/01/20  Yes [provider]  simvastatin (ZOCOR) 20 MG tablet Take 20 mg by mouth daily. 02/01/20  Yes [provider]  HYDROcodone-acetaminophen (NORCO/VICODIN) 5-325 MG tablet Take 1 tablet by mouth every 4 (four) hours as needed. Patient not taking: Reported on 02/14/2020 01/26/20   Henderly, Britni A, PA-C  lidocaine (LIDODERM) 5 % Place 1 patch onto the skin daily. Remove & Discard patch within 12 hours or as directed by MD Patient not taking: Reported on 02/14/2020 01/26/20   Henderly, Britni A, PA-C     Vital Signs: BP (!) 151/81 (BP Location: Left Arm)   Pulse 78   Temp 97.6 F (36.4 C)   Resp 18   Ht '5\' 3"'  (1.6 m)   Wt 171 lb 11.8 oz (77.9 kg)   SpO2 97%   BMI 30.42 kg/m   Physical Exam awake/alert; chest- CTA bilat; rt IJ HD cath in place; heart- RRR; abd- soft, sl  protuberant, +BS,NT; ext- no edema; some weakness//dim sensation noted LLE   Imaging: CT ABDOMEN PELVIS WO CONTRAST  Result Date: 02/14/2020 CLINICAL DATA:  Abdominal trauma. Trauma 1 month ago. Hypercalcemia. Fell 1 month ago. Abdominal pain, LOWER back pain, vomiting, diarrhea since the fall. EXAM: CT CHEST, ABDOMEN AND PELVIS WITHOUT CONTRAST TECHNIQUE: Multidetector CT imaging of the chest, abdomen and pelvis was performed following the standard protocol without IV contrast. COMPARISON:  None. FINDINGS: CT CHEST FINDINGS Cardiovascular: Heart is normal in size. Minimal atherosclerotic calcification of the coronary vessels. There is focal calcification of the ascending thoracic aorta, not associated with aneurysm. Normal noncontrast appearance of the pulmonary arteries. Mediastinum/Nodes: The visualized portion of the thyroid gland has a normal appearance. No significant mediastinal, hilar, or axillary adenopathy. Lungs/Pleura: There is subsegmental atelectasis at the lung bases. No suspicious pulmonary nodules. There is focal pleural thickening adjacent to LEFT anterior second rib lesion. Musculoskeletal: Diffuse lytic lesions are identified throughout the skeleton. Lesions vary in size from 2 millimeters to 1.8 centimeters. There is near complete replacement of the T5 vertebral body and loss of vertebral body height by approximately 30%. A soft tissue component extends to the RIGHT of the vertebral body and extends into the canal, impinging on the spinal cord, image 25 of series 3. Soft tissue mass in this region is 4.2 x 3.2 centimeters. At T9, there is near complete replacement of the vertebral body with soft  tissue component to the RIGHT of the vertebral body. Loss of vertebral body height is approximately 30%. Suspect impingement of the cord at this level. Image 38 of series 3. Large lytic lesion is identified within the T5 vertebral body, measuring 1.8 centimeters. This lesion extends to the RIGHT  cortex 2. Acute fracture of LEFT ribs 9 and 10, possibly pathologic. There is a soft tissue component associated with lesions in the LEFT second rib. Soft tissue component measures 1.1 x 1.8 centimeters. CT ABDOMEN PELVIS FINDINGS Hepatobiliary: No focal liver abnormality is seen. No gallstones, gallbladder wall thickening, or biliary dilatation. Pancreas: Unremarkable. No pancreatic ductal dilatation or surrounding inflammatory changes. Spleen: Normal in size without focal abnormality. Adrenals/Urinary Tract: Adrenal glands are unremarkable. Kidneys are normal, without renal calculi, focal lesion, or hydronephrosis. Bladder is unremarkable. Stomach/Bowel: Stomach and small bowel loops are unremarkable. The appendix is well seen and has a normal appearance. Loops of colon are unremarkable. Average stool burden. Vascular/Lymphatic: No significant vascular findings are present. No enlarged abdominal or pelvic lymph nodes. Reproductive: Prostate is mildly enlarged. There are prostatic calcifications. Other: Fat containing RIGHT inguinal hernia.  No ascites. Musculoskeletal: Diffuse lytic lesions throughout the pelvis, proximal femurs, and lumbar spine. IMPRESSION: 1. Diffuse lytic lesions throughout the skeleton, consistent with multiple myeloma or metastatic disease. Multiple myeloma is favored. 2. Pathologic fractures at T5 and T9. These levels show significant soft tissue mass, and impingement upon the spinal cord. Recommend further evaluation with MRI of the thoracic spine with and without contrast. 3. Acute fractures of LEFT ribs 9 and 10, possibly pathologic. 4. No evidence for acute injury of the abdomen or pelvis. 5. Fat containing RIGHT inguinal hernia. 6. Mildly enlarged prostate. 7. Aortic Atherosclerosis (ICD10-I70.0). These results were called by telephone at the time of interpretation on 02/14/2020 at 4:06 pm to provider [facility] , who verbally acknowledged these results. Electronically Signed   By:  Nolon Nations M.D.   On: 02/14/2020 16:07   CT Chest Wo Contrast  Result Date: 02/14/2020 CLINICAL DATA:  Abdominal trauma. Trauma 1 month ago. Hypercalcemia. Fell 1 month ago. Abdominal pain, LOWER back pain, vomiting, diarrhea since the fall. EXAM: CT CHEST, ABDOMEN AND PELVIS WITHOUT CONTRAST TECHNIQUE: Multidetector CT imaging of the chest, abdomen and pelvis was performed following the standard protocol without IV contrast. COMPARISON:  None. FINDINGS: CT CHEST FINDINGS Cardiovascular: Heart is normal in size. Minimal atherosclerotic calcification of the coronary vessels. There is focal calcification of the ascending thoracic aorta, not associated with aneurysm. Normal noncontrast appearance of the pulmonary arteries. Mediastinum/Nodes: The visualized portion of the thyroid gland has a normal appearance. No significant mediastinal, hilar, or axillary adenopathy. Lungs/Pleura: There is subsegmental atelectasis at the lung bases. No suspicious pulmonary nodules. There is focal pleural thickening adjacent to LEFT anterior second rib lesion. Musculoskeletal: Diffuse lytic lesions are identified throughout the skeleton. Lesions vary in size from 2 millimeters to 1.8 centimeters. There is near complete replacement of the T5 vertebral body and loss of vertebral body height by approximately 30%. A soft tissue component extends to the RIGHT of the vertebral body and extends into the canal, impinging on the spinal cord, image 25 of series 3. Soft tissue mass in this region is 4.2 x 3.2 centimeters. At T9, there is near complete replacement of the vertebral body with soft tissue component to the RIGHT of the vertebral body. Loss of vertebral body height is approximately 30%. Suspect impingement of the cord at this level. Image 38 of  series 3. Large lytic lesion is identified within the T5 vertebral body, measuring 1.8 centimeters. This lesion extends to the RIGHT cortex 2. Acute fracture of LEFT ribs 9 and 10,  possibly pathologic. There is a soft tissue component associated with lesions in the LEFT second rib. Soft tissue component measures 1.1 x 1.8 centimeters. CT ABDOMEN PELVIS FINDINGS Hepatobiliary: No focal liver abnormality is seen. No gallstones, gallbladder wall thickening, or biliary dilatation. Pancreas: Unremarkable. No pancreatic ductal dilatation or surrounding inflammatory changes. Spleen: Normal in size without focal abnormality. Adrenals/Urinary Tract: Adrenal glands are unremarkable. Kidneys are normal, without renal calculi, focal lesion, or hydronephrosis. Bladder is unremarkable. Stomach/Bowel: Stomach and small bowel loops are unremarkable. The appendix is well seen and has a normal appearance. Loops of colon are unremarkable. Average stool burden. Vascular/Lymphatic: No significant vascular findings are present. No enlarged abdominal or pelvic lymph nodes. Reproductive: Prostate is mildly enlarged. There are prostatic calcifications. Other: Fat containing RIGHT inguinal hernia.  No ascites. Musculoskeletal: Diffuse lytic lesions throughout the pelvis, proximal femurs, and lumbar spine. IMPRESSION: 1. Diffuse lytic lesions throughout the skeleton, consistent with multiple myeloma or metastatic disease. Multiple myeloma is favored. 2. Pathologic fractures at T5 and T9. These levels show significant soft tissue mass, and impingement upon the spinal cord. Recommend further evaluation with MRI of the thoracic spine with and without contrast. 3. Acute fractures of LEFT ribs 9 and 10, possibly pathologic. 4. No evidence for acute injury of the abdomen or pelvis. 5. Fat containing RIGHT inguinal hernia. 6. Mildly enlarged prostate. 7. Aortic Atherosclerosis (ICD10-I70.0). These results were called by telephone at the time of interpretation on 02/14/2020 at 4:06 pm to provider [facility] , who verbally acknowledged these results. Electronically Signed   By: Nolon Nations M.D.   On: 02/14/2020 16:07   MR  THORACIC SPINE W WO CONTRAST  Result Date: 02/14/2020 CLINICAL DATA:  Back pain. Compression fracture. Fever with nausea and vomiting. EXAM: MRI THORACIC WITHOUT AND WITH CONTRAST TECHNIQUE: Multiplanar and multiecho pulse sequences of the thoracic spine were obtained without and with intravenous contrast. CONTRAST:  33m GADAVIST GADOBUTROL 1 MMOL/ML IV SOLN COMPARISON:  None. FINDINGS: MRI THORACIC SPINE FINDINGS Alignment:  Normal Vertebrae: There are numerous bone marrow lesions throughout all visualized levels, but worst at T5, T6 and T9. There is compression deformity of T6 with 50% height loss and 5 mm of retropulsion. There is compression deformity of T9 with approximately 10% height loss and 3 mm of retropulsion. Cord: There is mild hyperintense T2-weighted signal in the spinal cord at T5. Paraspinal and other soft tissues: There is soft tissue mass is associated with the vertebral lesions at T6 and T9. Otherwise, the paraspinal soft tissues are unremarkable. Disc levels: T5-6: Contrast-enhancing mass replaces most of the vertebral body bone marrow and extends into the pedicles, transverse processes and the right paraspinal tissues. There is also epidural extension that severely narrows the thecal sac and causes mass effect on the spinal cord. The mass also severely narrows the right T5 neural foramen. T8-9: Soft tissue mass replacing much of the bone marrow at T9 with epidural extension mildly narrowing the thecal sac with slight mass effect on the spinal cord. The mass extends into the right neural foramen. IMPRESSION: 1. Numerous bone marrow lesions throughout all visualized levels, but worst at T5, T6 and T9. 2. 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 The T6 lesion severely narrows the thecal sac and deforms the spinal cord. 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 3. Pathologic fractures of T6 and T9 with 3-5 mm of retropulsion.  Electronically Signed   By: Ulyses Jarred M.D.   On: 02/14/2020 19:14   IR Fluoro Guide CV Line Right  Result Date: 02/17/2020 CLINICAL DATA:  Renal failure and need for tunneled hemodialysis catheter to begin hemodialysis. EXAM: TUNNELED CENTRAL VENOUS HEMODIALYSIS CATHETER PLACEMENT WITH ULTRASOUND AND FLUOROSCOPIC GUIDANCE ANESTHESIA/SEDATION: 1.0 mg IV Versed; 50 mcg IV Fentanyl. Total Moderate Sedation Time:   23 minutes. The patient's level of consciousness and physiologic status were continuously monitored during the procedure by Radiology nursing. MEDICATIONS: 2 g IV Ancef. FLUOROSCOPY TIME:  48 seconds. 4.0 mGy. PROCEDURE: The procedure, risks, benefits, and alternatives were explained to the patient. Questions regarding the procedure were encouraged and answered. The patient understands and consents to the procedure. A timeout was performed prior to initiating the procedure. The right neck and chest were prepped with chlorhexidine in a sterile fashion, and a sterile drape was applied covering the operative field. Maximum barrier sterile technique with sterile gowns and gloves were used for the procedure. Local anesthesia was provided with 1% lidocaine. Ultrasound was used to confirm patency of the right internal jugular vein. After creating a small venotomy incision, a 21 gauge needle was advanced into the right internal jugular vein under direct, real-time ultrasound guidance. Ultrasound image documentation was performed. After securing guidewire access, an 8 Fr dilator was placed. A J-wire was kinked to measure appropriate catheter length. A Palindrome tunneled hemodialysis catheter measuring 19 cm from tip to cuff was chosen for placement. This was tunneled in a retrograde fashion from the chest wall to the venotomy incision. At the venotomy, serial dilatation was performed and a 15 Fr peel-away sheath was placed over a guidewire. The catheter was then placed through the sheath and the sheath  removed. Final catheter positioning was confirmed and documented with a fluoroscopic spot image. The catheter was aspirated, flushed with saline, and injected with appropriate volume heparin dwells. The venotomy incision was closed with subcuticular 4-0 Vicryl. Dermabond was applied to the incision. The catheter exit site was secured with 0-Prolene retention sutures. COMPLICATIONS: None.  No pneumothorax. FINDINGS: After catheter placement, the tip lies in the right atrium. The catheter aspirates normally and is ready for immediate use. IMPRESSION: Placement of tunneled hemodialysis catheter via the right internal jugular vein. The catheter tip lies in the right atrium. The catheter is ready for immediate use. Electronically Signed   By: Aletta Edouard M.D.   On: 02/17/2020 17:22   IR US Guide Vasc Access Right  Result Date: 02/17/2020 CLINICAL DATA:  Renal failure and need for tunneled hemodialysis catheter to begin hemodialysis. EXAM: TUNNELED CENTRAL VENOUS HEMODIALYSIS CATHETER PLACEMENT WITH ULTRASOUND AND FLUOROSCOPIC GUIDANCE ANESTHESIA/SEDATION: 1.0 mg IV Versed; 50 mcg IV Fentanyl. Total Moderate Sedation Time:   23 minutes. The patient's level of consciousness and physiologic status were continuously monitored during the procedure by Radiology nursing. MEDICATIONS: 2 g IV Ancef. FLUOROSCOPY TIME:  48 seconds. 4.0 mGy. PROCEDURE: The procedure, risks, benefits, and alternatives were explained to the patient. Questions regarding the procedure were encouraged and answered. The patient understands and consents to the procedure. A timeout was performed prior to initiating the procedure. The right neck and chest were prepped with chlorhexidine in a sterile fashion, and a sterile drape was applied covering the operative field. Maximum barrier sterile technique with sterile gowns and gloves were used for the procedure.  Local anesthesia was provided with 1% lidocaine. Ultrasound was used to confirm patency of  the right internal jugular vein. After creating a small venotomy incision, a 21 gauge needle was advanced into the right internal jugular vein under direct, real-time ultrasound guidance. Ultrasound image documentation was performed. After securing guidewire access, an 8 Fr dilator was placed. A J-wire was kinked to measure appropriate catheter length. A Palindrome tunneled hemodialysis catheter measuring 19 cm from tip to cuff was chosen for placement. This was tunneled in a retrograde fashion from the chest wall to the venotomy incision. At the venotomy, serial dilatation was performed and a 15 Fr peel-away sheath was placed over a guidewire. The catheter was then placed through the sheath and the sheath removed. Final catheter positioning was confirmed and documented with a fluoroscopic spot image. The catheter was aspirated, flushed with saline, and injected with appropriate volume heparin dwells. The venotomy incision was closed with subcuticular 4-0 Vicryl. Dermabond was applied to the incision. The catheter exit site was secured with 0-Prolene retention sutures. COMPLICATIONS: None.  No pneumothorax. FINDINGS: After catheter placement, the tip lies in the right atrium. The catheter aspirates normally and is ready for immediate use. IMPRESSION: Placement of tunneled hemodialysis catheter via the right internal jugular vein. The catheter tip lies in the right atrium. The catheter is ready for immediate use. Electronically Signed   By: Aletta Edouard M.D.   On: 02/17/2020 17:22    Labs:  CBC: Recent Labs    02/16/20 0028 02/17/20 0102 02/17/20 1800 02/18/20 0250  WBC 10.9* 11.1* 10.2 9.1  HGB 11.5* 10.9* 11.3* 10.7*  HCT 33.0* 31.6* 32.7* 30.8*  PLT 173 181 182 178    COAGS: Recent Labs    02/15/20 1150  INR 1.1  APTT 20*    BMP: Recent Labs    02/16/20 0028 02/17/20 0102 02/17/20 1800 02/18/20 0250  NA 128* 129* 129* 135  K 4.5 4.6 4.2 4.1  CL 93* 96* 97* 101  CO2 '23 25 22  26  ' GLUCOSE 116* 123* 95 102*  BUN 76* 67* 65* 36*  CALCIUM 13.5* 14.1* 14.0* 11.1*  CREATININE 4.53* 4.74* 4.68* 3.57*  GFRNONAA 13* 13* 13* 18*  GFRAA 16* 15* 15* 21*    LIVER FUNCTION TESTS: Recent Labs    02/14/20 1252 02/15/20 0202 02/17/20 1800 02/18/20 0250  BILITOT 1.3* 0.7  --  0.5  AST 34 28  --  29  ALT 42 41  --  25  ALKPHOS 66 58  --  55  PROT 6.8 6.2*  --  5.4*  ALBUMIN 3.3* 2.9* 2.4* 2.3*    Assessment and Plan: 54 yo male with hx of weakness,back pain, pleuritic/rib pain, severe AKI, elevated calcium levels, M spike on protein electrophoresis, numerous lytic bony lesions/path fx's T6/T9;s/p HD cath placement 8/11;  request now received from oncology for CT guided bone marrow biopsy.Risks and benefits of procedure was discussed with the patient and/or patient's family via portable interpreter device including, but not limited to bleeding, infection, damage to adjacent structures or low yield requiring additional tests.  All of the questions were answered and there is agreement to proceed.  Consent signed and in chart. Procedure planned for 8/13 am.     Electronically Signed: D. Rowe Robert, PA-C 02/18/2020, 10:54 AM   I spent a total of 20 minutes  at the the patient's bedside AND on the patient's hospital floor or unit, greater than 50% of which was counseling/coordinating care for CT  guided bone marrow biopsy    Patient ID: Trevor Shelton, male   DOB: 04/15/1966, 54 y.o.   MRN: [id number]

## 2020-02-18 NOTE — Progress Notes (Signed)
New OT orders acknowledged. Pt currently on OT caseload. Plan to continue with current POC.  Tyrone Schimke, OT Acute Rehabilitation Services Pager: [phone] Office: [phone]

## 2020-02-18 NOTE — Telephone Encounter (Signed)
Arrangements made for this patient to be seen prior to his radiation appt  today. [facility] aware  He is coming from [facility] in-patient

## 2020-02-18 NOTE — Progress Notes (Signed)
   Will schedule Bone marrow biopsy for 8/13--  Pt to go to [facility] Radiation Therapy today

## 2020-02-19 ENCOUNTER — Inpatient Hospital Stay (HOSPITAL_COMMUNITY): Payer: Self-pay

## 2020-02-19 ENCOUNTER — Telehealth: Payer: Self-pay | Admitting: Hematology and Oncology

## 2020-02-19 ENCOUNTER — Other Ambulatory Visit: Payer: Self-pay

## 2020-02-19 ENCOUNTER — Ambulatory Visit
Admit: 2020-02-19 | Discharge: 2020-02-19 | Disposition: A | Discharge status: Home / Self Care | Payer: Self-pay | Attending: Radiation Oncology | Admitting: Radiation Oncology

## 2020-02-19 LAB — COMPREHENSIVE METABOLIC PANEL
ALT: 26 U/L (ref 0–44)
AST: 30 U/L (ref 15–41)
Albumin: 2.4 g/dL — ABNORMAL LOW (ref 3.5–5.0)
Alkaline Phosphatase: 61 U/L (ref 38–126)
Anion gap: 10 (ref 5–15)
BUN: 52 mg/dL — ABNORMAL HIGH (ref 6–20)
CO2: 21 mmol/L — ABNORMAL LOW (ref 22–32)
Calcium: 10.9 mg/dL — ABNORMAL HIGH (ref 8.9–10.3)
Chloride: 99 mmol/L (ref 98–111)
Creatinine, Ser: 4.1 mg/dL — ABNORMAL HIGH (ref 0.61–1.24)
GFR calc Af Amer: 18 mL/min — ABNORMAL LOW (ref >60)
GFR calc non Af Amer: 15 mL/min — ABNORMAL LOW (ref >60)
Glucose, Bld: 147 mg/dL — ABNORMAL HIGH (ref 70–99)
Potassium: 4.5 mmol/L (ref 3.5–5.1)
Sodium: 130 mmol/L — ABNORMAL LOW (ref 135–145)
Total Bilirubin: 0.6 mg/dL (ref 0.3–1.2)
Total Protein: 5.7 g/dL — ABNORMAL LOW (ref 6.5–8.1)

## 2020-02-19 LAB — CBC WITH DIFFERENTIAL/PLATELET
Abs Immature Granulocytes: 0.11 10*3/uL — ABNORMAL HIGH (ref 0.00–0.07)
Basophils Absolute: 0 10*3/uL (ref 0.0–0.1)
Basophils Relative: 0 %
Eosinophils Absolute: 0 10*3/uL (ref 0.0–0.5)
Eosinophils Relative: 0 %
HCT: 30.9 % — ABNORMAL LOW (ref 39.0–52.0)
Hemoglobin: 10.7 g/dL — ABNORMAL LOW (ref 13.0–17.0)
Immature Granulocytes: 1 %
Lymphocytes Relative: 5 %
Lymphs Abs: 0.6 10*3/uL — ABNORMAL LOW (ref 0.7–4.0)
MCH: 30 pg (ref 26.0–34.0)
MCHC: 34.6 g/dL (ref 30.0–36.0)
MCV: 86.6 fL (ref 80.0–100.0)
Monocytes Absolute: 0.5 10*3/uL (ref 0.1–1.0)
Monocytes Relative: 4 %
Neutro Abs: 10.3 10*3/uL — ABNORMAL HIGH (ref 1.7–7.7)
Neutrophils Relative %: 90 %
Platelets: 187 10*3/uL (ref 150–400)
RBC: 3.57 MIL/uL — ABNORMAL LOW (ref 4.22–5.81)
RDW: 11.3 % — ABNORMAL LOW (ref 11.5–15.5)
WBC: 11.5 10*3/uL — ABNORMAL HIGH (ref 4.0–10.5)
nRBC: 0 % (ref 0.0–0.2)

## 2020-02-19 LAB — OSMOLALITY, URINE: Osmolality, Ur: 402 mOsm/kg (ref 300–900)

## 2020-02-19 LAB — SODIUM, URINE, RANDOM: Sodium, Ur: 96 mmol/L

## 2020-02-19 LAB — LACTATE DEHYDROGENASE: LDH: 210 U/L — ABNORMAL HIGH (ref 98–192)

## 2020-02-19 LAB — MAGNESIUM: Magnesium: 1.6 mg/dL — ABNORMAL LOW (ref 1.7–2.4)

## 2020-02-19 LAB — CREATININE, URINE, RANDOM: Creatinine, Urine: 63.21 mg/dL

## 2020-02-19 LAB — PHOSPHORUS: Phosphorus: 3.6 mg/dL (ref 2.5–4.6)

## 2020-02-19 MED ORDER — LIDOCAINE HCL 1 % IJ SOLN
INTRAMUSCULAR | Status: AC
  Filled 2020-02-19: qty 20

## 2020-02-19 MED ORDER — LIDOCAINE HCL (PF) 1 % IJ SOLN
INTRAMUSCULAR | Status: AC | PRN
  Administered 2020-02-19: 30 mL

## 2020-02-19 MED ORDER — MIDAZOLAM HCL 2 MG/2ML IJ SOLN
INTRAMUSCULAR | Status: AC
  Filled 2020-02-19: qty 2

## 2020-02-19 MED ORDER — HEPARIN SODIUM (PORCINE) 1000 UNIT/ML IJ SOLN
INTRAMUSCULAR | Status: AC
  Administered 2020-02-19: 1000 [IU]
  Filled 2020-02-19: qty 4

## 2020-02-19 MED ORDER — FENTANYL CITRATE (PF) 100 MCG/2ML IJ SOLN
INTRAMUSCULAR | Status: AC | PRN
  Administered 2020-02-19: 50 ug via INTRAVENOUS

## 2020-02-19 MED ORDER — FENTANYL CITRATE (PF) 100 MCG/2ML IJ SOLN
INTRAMUSCULAR | Status: AC
  Filled 2020-02-19: qty 2

## 2020-02-19 MED ORDER — MIDAZOLAM HCL 2 MG/2ML IJ SOLN
INTRAMUSCULAR | Status: AC | PRN
  Administered 2020-02-19: 1 mg via INTRAVENOUS

## 2020-02-19 MED ORDER — MAGNESIUM SULFATE IN D5W 1-5 GM/100ML-% IV SOLN
1.0000 g | Freq: Once | INTRAVENOUS | Status: AC
  Administered 2020-02-19: 1 g via INTRAVENOUS
  Filled 2020-02-19: qty 100

## 2020-02-19 NOTE — Progress Notes (Signed)
PROGRESS NOTE    Trevor Shelton  TDV:[id number] DOB: October 10, 1965 DOA: 02/14/2020 PCP: Patient, No Pcp Per   Brief Narrative:  The patient is a 54 year old Spanish-speaking male with no real past medical history who presented to the ED with generalized weakness, left rib pain, lower back pain and constipation for a month.  Patient son was at bedside during admission and told most of the story.  Patient fell off a ladder from a height of about 4 feet about a month ago and landed on his left side and since then he started having back pain, left rib pain and weakness in bilateral extremities which is subsequently getting worse.  He presented to the ED and had a work-up and was found to have an elevated WBC of 13.3.  Is also noted to have sodium 125, severe AKI, calcium more than 15,.  CT of the abdomen pelvis showed diffuse lytic lesions throughout the skeleton consistent with multiple myeloma or metastatic disease.  He also was noted to have pathological fractures at T5 and T9, and acute fracture left rib 9 and 10, the EDP consulted neurosurgery and oncology.  Oncology was recommended to check SPEP and UPEP and tried hospitalist will admit this patient for hypercalcemia and AKI with suspected multiple myeloma.  Neurosurgery spoke with the EDP and then felt from a neurosurgical standpoint in the setting of multiple myeloma, there are few surgical indications and management was typically radiation.  They felt that they could explore the possibility of kyphoplasty.  At the time of admission medical oncology was consulted and they felt that patient's diagnosis of multiple myeloma was not confirmed and they felt that he did not need to be transferred to [facility].  Medical oncology recommended SPEP and UPEP to confirm diagnosis of multiple myeloma.  Patient does have an M Spike and Medical Oncology was consulted for further evaluation and recommendations.  Neurosurgery recommended the patient get treated with IV  dexamethasone and recommended radiation oncology evaluated.  Radiation oncology was consulted and patient went for simulation and underwent his first radiation treatment today.  He is pending a bone marrow biopsy and this will be done tomorrow and then patient will be dialyzed again later.  Biopsy will be done to help confirm the diagnosis of multiple myeloma as SPEP showed M protein of 0.9, and serum fixation light chains and UPEP is still pending.  Medical oncology agrees with radiation to the spine given the tumor extensions at T6 and T9 has a bone marrow biopsy cannot confirm diagnosis recommend biopsy of the soft tissue spinal mass.  Patient was started on bisphosphonate therapy and is continue to be hydrated.  Neurosurgery is recommending reducing dexamethasone 4 mg every 8 hours on 02/22/2020 and continue dexamethasone through the conclusion of his radiation and can taper.  Patient's renal function is slightly improved from yesterday during his hemodialysis session.  If no light chains in the urine then myeloma kidney is less likely per nephrology.  We will continue to hydrate per nephrology recommendations  Patient is Kappa free light chain was elevated but we still do not have a definite diagnosis of myeloma and he underwent a biopsy today.  After his biopsy he underwent dialysis again today and they will give him a break and reassess Monday to see if there is any indication of renal recovery.  After his dialysis he went to radiation therapy.  PT OT recommending CIR so we will follow up with CIR rehab coordinator next week.  Assessment &  Plan:   Principal Problem:   Hypercalcemia Active Problems:   AKI (acute kidney injury) ([place])   Pathologic fracture   Hyponatremia   Leukocytosis   Kappa light chain myeloma (HCC)  Hypercalcemia withT6, T9pathological fracture and severeAKI; concern for Multiple Myeloma ([place]) -Likely secondary to multiple myeloma but we do not have a confirmed  diagnosis and he will be going for a bone marrow biopsy tomorrow. -Reviewed CT chest, abdomen/pelvis result.  -MRI thoracic spine showed "Numerous bone marrow lesions throughout all visualized levels, but worst at T5, T6 and T9. 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 The T6 lesion severely narrows the thecal sac and deforms the spinal cord. 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 Pathologic fractures of T6 and T9 with 3-5 mm of retropulsion." ;  Patient continues to have some good strength in his legs but complains of them hurting. -Neuro-surgery evaluated initially and from a neurosurgical standpoint they do not feel that he is a surgical indication and they feel the management is typically radiation.  Dr. Marcello Moores did advise that if the patient is a good candidate for kyphoplasty this could be explored. Will discuss the case again with Dr. Marcello Moores and Dr. Marcello Moores evaluated and he feels that neurosurgical intervention is not necessary at this time and recommend starting the patient on IV dexamethasone 6 mg every 6 hours and then tapering down to 4 mg every 8 hours on 02/22/2020 and continuing the dexamethasone through the duration of radiation.  He also recommended radiation oncology be involved and patient was simulated and had his first radiation treatment today. -Patient received IV fluid and Dilaudid in the ED. -Admitted patient on the floor.  -EDP consulted neurosurgery and oncology -OncologyDr. Shadad-recommended to check UPEP and SPEP-to confirm multiple myeloma and oncology will get involved at that point. Patient has an M Spike of 0.9 Total Protein of 5.8, and Kappa/Lambda Light chains are now showing an elevation as his kappa free light chains with a value of 37.5 and a lambda free light chain of 9.9 and a kappa/lambda light chain ratio of 3.79.  Likely this is a kappa free light chain multiple myeloma and his UPEP is still  pending as well as his bone marrow biopsy to confirm diagnosis but his spine lesions could be from a plasmacytoma -C/w Antiemetic, pain control -Nephrology following- concern for myeloma and he underwent dialysis the day before yesterday given uremic symptoms and is scheduled for dialysis again today -Continue with aggressive IV fluid for hydration with isotonic fluids  -Started on calcitonin and getting 290 units sq BID x2 Doses  -Given renal function nephrology will give pamidronate 30 mg IV x1 -PTH is low consistent which rules out primary hyperparathyroid; appropriate response for level of hypercalcemia. -Lidocaine patch for rib pain -Ca2+ went from >15.0 -> 13.5 -> 14.1 -> 11.1 -> 10.9 -Medical Oncology consulted for further evaluation and recommends and bone marrow biopsy and this was done today -Appreciate multidisciplinary involvement of medical oncology, radiation oncology, neurosurgery, interventional radiology, nephrology and will follow up on specialist recommendations; he underwent a bone marrow biopsy today  AKI, slightly improved with dialysis Metabolic acidosis -Likely in the setting of underlying multiple myeloma prerenal azotemia +/- ATN -Nephrology following and feels likely in the setting of multiple myeloma -Avoid nephrotoxic medications -Patient having Uremic Symptoms so Nephrology will initiate HD and he had his first dialysis session 02/17/2020 -Continue IV fluids for hydration (Getting NS at 150  mL/hr), strict I's and O's, daily weight, close monitoring urinary output and respiratory status -Patient now has a metabolic acidosis with a CO2 21, chloride level of 99, and anion gap of 10 -Nephrology feels that TPE may be true given that he has had symptoms for over a month and oncology does not feel it is likely necessary at this point given that his protein is not that high. -Check bladder scan to make sure patient is not retaining with all aggressive IV patient -His  BUN/creatinine went from 65/4.68 -> 36/3.57 -> 52/4.10 and he is getting dialysis today   Hyponatremia -Sodium of 125 on admission. -Yesterday sodium 135 and corrected in the setting of fluid resuscitation and dialysis and today it is 130 -Was started on IVF. Per nephrology likely in the event of pseudo from paraproteinemia. -We will check urine and serum osmolality, TSH, cortisol -Serum osmolality is high likely representing the paraprotein per Nephrology recc's  -Nephrology following and appreciate assistance   Leukocytosis -Likely reactive initially.  Improving slowly and now trended up in the setting of steroid demargination -WBC went from 13.3 -> 11.6 -> 10.9 -> 11.1 and today is trending down is 9.1 is now back up to 11.5 in the setting of steroids -Afebrile, no indication for antibiotics as there is no evidence of infection  -Continue to monitor and repeat CBC in the AM  Constipation, improving -Likely secondary to hypercalcemia due to underlying possible multiple myeloma -Bowel regimen was adjusted today and will be continued -Continue IV fluids. -Mobility as tolerated  Normocytic Anemia -Hgb/Hct went from 14.2/41.2 -> 10.9/31.6 and today his hemoglobin/hematocrit is 10.7/30.9 -? Dilutional Drop -Check Anemia Panel and showed an iron level of 38, U IBC 200, TIBC of 238, saturation ratios of 16%, ferritin level 14, folate level 11.7, vitamin B12 level 413; Continue to Monitor for S/Sx of Bleeding -Currently no overt bleeding noted -Repeat CBC in the AM  Hyperglycemia in the setting of new onset type 2 diabetes mellitus -Check HbA1c in the AM and his hemoglobin A1c was 6.5 -Patient's Blood Sugars have been ranging from 116-158 on daily BMP/CMP's; expect blood sugars to elevate in the setting of steroid demargination -Continue to monitor blood sugars carefully -Continue to Monitor closely and if necessary will place on Sensitive Novolog SSI AC and placed on sliding  scale  Hypomagnesemia -Patient magnesium level is 1.6 -replete with IV mag sulfate 1 g -Continue to monitor replete as necessary -Repeat magnesium level in a.m.  Obesity -Estimated body mass index is 30.3 kg/m as calculated from the following:   Height as of this encounter: _0  (1.6 m).   Weight as of this encounter: 77.6 kg. -Weight Loss and Dietary Counseling given   DVT prophylaxis: Heparin 5000 units subcu every 8h Code Status: FULL CODE  Family Communication: Discussed with family present at bedside  Disposition Plan: Pending further evaluation by Medical Oncology and clearance by Nephrology  Status is: Inpatient  Remains inpatient appropriate because:Ongoing diagnostic testing needed not appropriate for outpatient work up, Unsafe d/c plan, IV treatments appropriate due to intensity of illness or inability to take PO and Inpatient level of care appropriate due to severity of illness   Dispo: The patient is from: Home              Anticipated d/c is to: TBD              Anticipated d/c date is: 2 days  Patient currently is not medically stable to d/c.  Consultants:   Nephrology  Medical Oncology   IR  Procedures: [facility] placement with HD  Bone Marrow Bx  Antimicrobials:  Anti-infectives (From admission, onward)   Start     Dose/Rate Route Frequency Ordered Stop   02/17/20 0930  ceFAZolin (ANCEF) IVPB 2g/100 mL premix        2 g 200 mL/hr over 30 Minutes Intravenous To Radiology 02/17/20 0851 02/17/20 1034     Subjective: Seen and examined at bedside with the interpreter [place] #[id number] again and the patient states that he is upset about when he gets transferred to radiation the way that the techs movement hurts him.  No nausea or vomiting and states that he has had a bowel movement.  His main complaint was about how he is moved on and off the table to radiation.  He feels that the techs are too rough with him.  He denies any other complaints or  concerns and feels that his left leg is having little bit more mobility.  No other concerns up at this time.  Objective: Vitals:   02/19/20 1630 02/19/20 1633 02/19/20 1700 02/19/20 1845  BP: 129/68 133/70 139/82 137/69  Pulse: 82 85 87 75  Resp: (!) 26 (!) 25 18   Temp:  98.6 F (37 C) 98.1 F (36.7 C) 98.2 F (36.8 C)  TempSrc:  Oral Oral Oral  SpO2: 96%  97% 97%  Weight: 77.6 kg     Height:        Intake/Output Summary (Last 24 hours) at 02/19/2020 1947 Last data filed at 02/19/2020 1630 Gross per 24 hour  Intake --  Output 1000 ml  Net -1000 ml   Filed Weights   02/19/20 0459 02/19/20 1255 02/19/20 1630  Weight: 77 kg 78.6 kg 77.6 kg   Examination: Physical Exam:  Constitutional: WN/WD obese Spanish-speaking male currently in no acute distress appears calm and comfortable today. Eyes: Lids and conjunctivae normal, sclerae anicteric  ENMT: External Ears, Nose appear normal. Grossly normal hearing.  Neck: Appears normal, supple, no cervical masses, normal ROM, no appreciable thyromegaly; no JVD Respiratory: Diminished to auscultation bilaterally, no wheezing, rales, rhonchi or crackles. Normal respiratory effort and patient is not tachypenic. No accessory muscle use.  Unlabored breathing Cardiovascular: RRR, no murmurs / rubs / gallops. S1 and S2 auscultated.  Very minimal extremity edema Abdomen: Soft, non-tender, distended secondary body habitus.Bowel sounds positive.  GU: Deferred. Musculoskeletal: No clubbing / cyanosis of digits/nails. No joint deformity upper and lower extremities.  Skin: No rashes, lesions, ulcers on limited skin evaluation. No induration; Warm and dry.  Neurologic: CN 2-12 grossly intact with no focal deficits. Romberg sign and cerebellar reflexes not assessed.  Psychiatric: Normal judgment and insight. Alert and oriented x 3. Normal mood and appropriate affect.   Data Reviewed: I have personally reviewed following labs and imaging  studies  CBC: Recent Labs  Lab 02/16/20 0028 02/17/20 0102 02/17/20 1800 02/18/20 0250 02/19/20 0322  WBC 10.9* 11.1* 10.2 9.1 11.5*  NEUTROABS  --   --   --  7.9* 10.3*  HGB 11.5* 10.9* 11.3* 10.7* 10.7*  HCT 33.0* 31.6* 32.7* 30.8* 30.9*  MCV 84.6 84.3 85.6 85.8 86.6  PLT 173 181 182 178 203   Basic Metabolic Panel: Recent Labs  Lab 02/15/20 0202 02/15/20 0202 02/16/20 0028 02/17/20 0102 02/17/20 1800 02/18/20 0250 02/19/20 0322  NA 130*   < > 128* 129* 129* 135 130*  K  4.9   < > 4.5 4.6 4.2 4.1 4.5  CL 89*   < > 93* 96* 97* 101 99  CO2 32   < > _0 21*  GLUCOSE 116*   < > 116* 123* 95 102* 147*  BUN 81*   < > 76* 67* 65* 36* 52*  CREATININE 4.92*   < > 4.53* 4.74* 4.68* 3.57* 4.10*  CALCIUM >15.0*   < > 13.5* 14.1* 14.0* 11.1* 10.9*  MG 1.8  --   --   --   --  1.7 1.6*  PHOS 6.3*  --   --  5.5* 5.3* 3.0 3.6   < > = values in this interval not displayed.   GFR: Estimated Creatinine Clearance: 18.6 mL/min (A) (by C-G formula based on SCr of 4.1 mg/dL (H)). Liver Function Tests: Recent Labs  Lab 02/14/20 1252 02/15/20 0202 02/17/20 1800 02/18/20 0250 02/19/20 0322  AST 34 28  --  29 30  ALT 42 41  --  25 26  ALKPHOS 66 58  --  55 61  BILITOT 1.3* 0.7  --  0.5 0.6  PROT 6.8 6.2*  --  5.4* 5.7*  ALBUMIN 3.3* 2.9* 2.4* 2.3* 2.4*   Recent Labs  Lab 02/14/20 1252  LIPASE 28   No results for input(s): AMMONIA in the last 168 hours. Coagulation Profile: Recent Labs  Lab 02/15/20 1150 02/18/20 1132  INR 1.1 1.1   Cardiac Enzymes: No results for input(s): CKTOTAL, CKMB, CKMBINDEX, TROPONINI in the last 168 hours. BNP (last 3 results) No results for input(s): PROBNP in the last 8760 hours. HbA1C: Recent Labs    02/18/20 0250  HGBA1C 6.5*   CBG: No results for input(s): GLUCAP in the last 168 hours. Lipid Profile: No results for input(s): CHOL, HDL, LDLCALC, TRIG, CHOLHDL, LDLDIRECT in the last 72 hours. Thyroid Function Tests: No  results for input(s): TSH, T4TOTAL, FREET4, T3FREE, THYROIDAB in the last 72 hours. Anemia Panel: Recent Labs    02/18/20 0250  VITAMINB12 413  FOLATE 11.7  FERRITIN 410*  TIBC 238*  IRON 38*  RETICCTPCT 1.1   Sepsis Labs: No results for input(s): PROCALCITON, LATICACIDVEN in the last 168 hours.  Recent Results (from the past 240 hour(s))  SARS Coronavirus 2 by RT PCR (hospital order, performed in [facility] hospital lab) Nasopharyngeal Nasopharyngeal Swab     Status: None   Collection Time: 02/14/20  4:05 PM   Specimen: Nasopharyngeal Swab  Result Value Ref Range Status   SARS Coronavirus 2 NEGATIVE NEGATIVE Final    Comment: (NOTE) SARS-CoV-2 target nucleic acids are NOT DETECTED.  The SARS-CoV-2 RNA is generally detectable in upper and lower respiratory specimens during the acute phase of infection. The lowest concentration of SARS-CoV-2 viral copies this assay can detect is 250 copies / mL. A negative result does not preclude SARS-CoV-2 infection and should not be used as the sole basis for treatment or other patient management decisions.  A negative result may occur with improper specimen collection / handling, submission of specimen other than nasopharyngeal swab, presence of viral mutation(s) within the areas targeted by this assay, and inadequate number of viral copies (<250 copies / mL). A negative result must be combined with clinical observations, patient history, and epidemiological information.  Fact Sheet for Patients:   StrictlyIdeas.no  Fact Sheet for Healthcare Providers: BankingDealers.co.za  This test is not yet approved or  cleared by the [place] FDA and has been authorized for detection  and/or diagnosis of SARS-CoV-2 by FDA under an Emergency Use Authorization (EUA).  This EUA will remain in effect (meaning this test can be used) for the duration of the COVID-19 declaration under Section 564(b)(1)  of the Act, 21 U.S.C. section 360bbb-3(b)(1), unless the authorization is terminated or revoked sooner.  Performed at [facility], [place] [address], [place], [place] [address]     RN Pressure Injury Documentation:     Estimated body mass index is 30.3 kg/m as calculated from the following:   Height as of this encounter: _0  (1.6 m).   Weight as of this encounter: 77.6 kg.  Malnutrition Type:  Nutrition Problem: Increased nutrient needs Etiology: cancer and cancer related treatments   Malnutrition Characteristics:  Signs/Symptoms: estimated needs   Nutrition Interventions:  Interventions: Ensure Enlive (each supplement provides 350kcal and 20 grams of protein)   Radiology Studies: CT BONE MARROW BIOPSY & ASPIRATION  Result Date: 02/19/2020 CLINICAL DATA:  Clinical suspicion multiple myeloma and need for bone marrow biopsy. EXAM: CT GUIDED BONE MARROW ASPIRATION AND BIOPSY ANESTHESIA/SEDATION: Versed 1.0 mg IV, Fentanyl 50 mcg IV Total Moderate Sedation Time:   14 minutes. The patient's level of consciousness and physiologic status were continuously monitored during the procedure by Radiology nursing. PROCEDURE: The procedure risks, benefits, and alternatives were explained to the patient. Questions regarding the procedure were encouraged and answered. The patient understands and consents to the procedure. A time out was performed prior to initiating the procedure. The right gluteal region was prepped with chlorhexidine. Sterile gown and sterile gloves were used for the procedure. Local anesthesia was provided with 1% Lidocaine. Under CT guidance, an 11 gauge On Control bone cutting needle was advanced from a posterior approach into the right iliac bone. Needle positioning was confirmed with CT. Initial non heparinized and heparinized aspirate samples were obtained of bone marrow. Core biopsy was performed via the On Control drill needle. COMPLICATIONS: None FINDINGS:  Inspection of initial aspirate did reveal visible particles. Intact core biopsy sample was obtained. IMPRESSION: CT guided bone marrow biopsy of right posterior iliac bone with both aspirate and core samples obtained. Electronically Signed   By: Aletta Edouard M.D.   On: 02/19/2020 12:21    Scheduled Meds: . Chlorhexidine Gluconate Cloth  6 each Topical Q0600  . dexamethasone (DECADRON) injection  6 mg Intravenous Q6H  . docusate sodium  100 mg Oral Daily  . feeding supplement (ENSURE ENLIVE)  237 mL Oral BID BM  . heparin injection (subcutaneous)  5,000 Units Subcutaneous Q8H  . lidocaine  1 patch Transdermal Q24H  . lidocaine      . pantoprazole (PROTONIX) IV  40 mg Intravenous Q24H  . polyethylene glycol  17 g Oral BID  . senna-docusate  1 tablet Oral BID  . sodium chloride flush  3 mL Intravenous Once   Continuous Infusions: . sodium chloride 100 mL/hr at 02/19/20 1658    LOS: 5 days   Kerney Elbe, DO [facility] PAGER is on [place]  If 7PM-7AM, please contact night-coverage [email or web address]

## 2020-02-19 NOTE — Telephone Encounter (Signed)
Scheduled per los. Called and left msg. Mailed printout  °

## 2020-02-19 NOTE — Procedures (Signed)
Interventional Radiology Procedure Note  Procedure: CT guided bone marrow aspiration and biopsy  Complications: None  EBL: < 10 mL  Findings: Aspirate and core biopsy performed of bone marrow in right iliac bone.  Plan: Bedrest supine x 1 hrs  Jashira Cotugno T. Jelena Malicoat, M.D Pager:  [phone]   

## 2020-02-19 NOTE — Progress Notes (Signed)
[facility] Progress Note   54 y.o.maleHTN p/wgeneralized weakness, left rib and lower back pain + constipation for a mth aftersustaininga fall from a ladder a mth ago. Pain is worse with deep inspiration and also has noted decreased urine output as well but denies obstructive like symptoms or incontinence.Noted on on CT to have diffuse lyticlesions throughout the skeletonand pathologic T5 and T9 , left 9th and 10th rib fractures. T6 level tumor extension narrowing thecal sac. He has been using Naproxen daily for the past 15 days.  Assessment/ Plan:   1. 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 Awaiting lab tests to confirm; no absolute indication for RRT at this time and no e/o obstruction on CT A/P.  - Hydratingaggressively  with strict I&O's, daily weights and close monitoring of UOP and respiratorystatus.  - Started RRT (hopefully temporarily) given uremic signs HD #1 on 8/11. Appreciate VIR placing the catheter promptly.  - Plan on HD today and then will give the weekend off; reassess Monday if there is any indication of renal recovery.  Check urine 24hr UPEP; if no light chains in urine then myeloma kidney less likely.   - Will request bladder scan to make sure he's not retaining with all the aggressive IV hydration.    2. Hypercalcemia - concerning for myeloma and at this time will hydrate aggressively with isotonic fluids. PTH is low consistent which rules out primary hyperparathyroid; appropriate response for level of hypercalcemia. Decr rate to 100 -Started on Calcitonin;given compromisedrenal function Given lower dosepamidronate. Improving trend.  3. HTN - resume home regimen. 4. Hyponatremia - Will continue to monitor for now;  urine osm 402 and serum osmolality 302, TSH NL Some element of ADH release from nausea that was present earlier. Euvolemic. . 5. Constipation from prerenal as well  as the hypercalcemia. He also has impingement but 4-5/5 strength in the legs.  Subjective:   Nausea resolved with dialysis but poor appetite, denies obstructive symptoms or dyspnea. C/o pain especially right flank. Weakness left leg mildly improved. Had 3 BM's yest and abd feeling better.   Objective:   BP (!) 150/83 (BP Location: Right Arm)   Pulse 79   Temp 98.3 F (36.8 C)   Resp 17   Ht 5\' 3"  (1.6 m)   Wt 77 kg   SpO2 97%   BMI 30.07 kg/m   Intake/Output Summary (Last 24 hours) at 02/19/2020 0810 Last data filed at 02/18/2020 1900 Gross per 24 hour  Intake 340 ml  Output 400 ml  Net -60 ml   Weight change: 1.6 kg  Physical Exam: GEN: NAD, A&Ox3, NCAT HEENT:Conjunctival pallor, EOMI NECK: Supple, no thyromegaly LUNGS: CTA B/L no rales, rhonchi or wheezing CV: RRR, No M/R/G ABD: SNDNT +BS  EXT: No lower extremity edema, left leg weaker than right, but able to elevate more today ACCESS: RIJ TC (8/11)  Imaging: IR Fluoro Guide CV Line Right  Result Date: 02/17/2020 CLINICAL DATA:  Renal failure and need for tunneled hemodialysis catheter to begin hemodialysis. EXAM: TUNNELED CENTRAL VENOUS HEMODIALYSIS CATHETER PLACEMENT WITH ULTRASOUND AND FLUOROSCOPIC GUIDANCE ANESTHESIA/SEDATION: 1.0 mg IV Versed; 50 mcg IV Fentanyl. Total Moderate Sedation Time:   23 minutes. The patient's level of consciousness and physiologic status were continuously monitored during the procedure by Radiology nursing. MEDICATIONS: 2 g IV Ancef. FLUOROSCOPY TIME:  48 seconds. 4.0 mGy. PROCEDURE: The procedure, risks, benefits, and alternatives were explained to the patient. Questions regarding the  procedure were encouraged and answered. The patient understands and consents to the procedure. A timeout was performed prior to initiating the procedure. The right neck and chest were prepped with chlorhexidine in a sterile fashion, and a sterile drape was applied covering the operative field. Maximum barrier  sterile technique with sterile gowns and gloves were used for the procedure. Local anesthesia was provided with 1% lidocaine. Ultrasound was used to confirm patency of the right internal jugular vein. After creating a small venotomy incision, a 21 gauge needle was advanced into the right internal jugular vein under direct, real-time ultrasound guidance. Ultrasound image documentation was performed. After securing guidewire access, an 8 Fr dilator was placed. A J-wire was kinked to measure appropriate catheter length. A Palindrome tunneled hemodialysis catheter measuring 19 cm from tip to cuff was chosen for placement. This was tunneled in a retrograde fashion from the chest wall to the venotomy incision. At the venotomy, serial dilatation was performed and a 15 Fr peel-away sheath was placed over a guidewire. The catheter was then placed through the sheath and the sheath removed. Final catheter positioning was confirmed and documented with a fluoroscopic spot image. The catheter was aspirated, flushed with saline, and injected with appropriate volume heparin dwells. The venotomy incision was closed with subcuticular 4-0 Vicryl. Dermabond was applied to the incision. The catheter exit site was secured with 0-Prolene retention sutures. COMPLICATIONS: None.  No pneumothorax. FINDINGS: After catheter placement, the tip lies in the right atrium. The catheter aspirates normally and is ready for immediate use. IMPRESSION: Placement of tunneled hemodialysis catheter via the right internal jugular vein. The catheter tip lies in the right atrium. The catheter is ready for immediate use. Electronically Signed   By: Aletta Edouard M.D.   On: 02/17/2020 17:22   IR US Guide Vasc Access Right  Result Date: 02/17/2020 CLINICAL DATA:  Renal failure and need for tunneled hemodialysis catheter to begin hemodialysis. EXAM: TUNNELED CENTRAL VENOUS HEMODIALYSIS CATHETER PLACEMENT WITH ULTRASOUND AND FLUOROSCOPIC GUIDANCE  ANESTHESIA/SEDATION: 1.0 mg IV Versed; 50 mcg IV Fentanyl. Total Moderate Sedation Time:   23 minutes. The patient's level of consciousness and physiologic status were continuously monitored during the procedure by Radiology nursing. MEDICATIONS: 2 g IV Ancef. FLUOROSCOPY TIME:  48 seconds. 4.0 mGy. PROCEDURE: The procedure, risks, benefits, and alternatives were explained to the patient. Questions regarding the procedure were encouraged and answered. The patient understands and consents to the procedure. A timeout was performed prior to initiating the procedure. The right neck and chest were prepped with chlorhexidine in a sterile fashion, and a sterile drape was applied covering the operative field. Maximum barrier sterile technique with sterile gowns and gloves were used for the procedure. Local anesthesia was provided with 1% lidocaine. Ultrasound was used to confirm patency of the right internal jugular vein. After creating a small venotomy incision, a 21 gauge needle was advanced into the right internal jugular vein under direct, real-time ultrasound guidance. Ultrasound image documentation was performed. After securing guidewire access, an 8 Fr dilator was placed. A J-wire was kinked to measure appropriate catheter length. A Palindrome tunneled hemodialysis catheter measuring 19 cm from tip to cuff was chosen for placement. This was tunneled in a retrograde fashion from the chest wall to the venotomy incision. At the venotomy, serial dilatation was performed and a 15 Fr peel-away sheath was placed over a guidewire. The catheter was then placed through the sheath and the sheath removed. Final catheter positioning was confirmed and documented with a  fluoroscopic spot image. The catheter was aspirated, flushed with saline, and injected with appropriate volume heparin dwells. The venotomy incision was closed with subcuticular 4-0 Vicryl. Dermabond was applied to the incision. The catheter exit site was secured  with 0-Prolene retention sutures. COMPLICATIONS: None.  No pneumothorax. FINDINGS: After catheter placement, the tip lies in the right atrium. The catheter aspirates normally and is ready for immediate use. IMPRESSION: Placement of tunneled hemodialysis catheter via the right internal jugular vein. The catheter tip lies in the right atrium. The catheter is ready for immediate use. Electronically Signed   By: Aletta Edouard M.D.   On: 02/17/2020 17:22    Labs: BMET Recent Labs  Lab 02/14/20 1252 02/14/20 1602 02/15/20 0202 02/16/20 0028 02/17/20 0102 02/17/20 1800 02/18/20 0250 02/19/20 0322  NA 125*  --  130* 128* 129* 129* 135 130*  K 4.4  --  4.9 4.5 4.6 4.2 4.1 4.5  CL 82*  --  89* 93* 96* 97* 101 99  CO2 29  --  32 23 25 22 26  21*  GLUCOSE 158*  --  116* 116* 123* 95 102* 147*  BUN 84*  --  81* 76* 67* 65* 36* 52*  CREATININE 4.73*  --  4.92* 4.53* 4.74* 4.68* 3.57* 4.10*  CALCIUM >15.0* 15.8* >15.0* 13.5* 14.1* 14.0* 11.1* 10.9*  PHOS  --   --  6.3*  --  5.5* 5.3* 3.0 3.6   CBC Recent Labs  Lab 02/17/20 0102 02/17/20 1800 02/18/20 0250 02/19/20 0322  WBC 11.1* 10.2 9.1 11.5*  NEUTROABS  --   --  7.9* 10.3*  HGB 10.9* 11.3* 10.7* 10.7*  HCT 31.6* 32.7* 30.8* 30.9*  MCV 84.3 85.6 85.8 86.6  PLT 181 182 178 187    Medications:    . Chlorhexidine Gluconate Cloth  6 each Topical Q0600  . dexamethasone (DECADRON) injection  6 mg Intravenous Q6H  . docusate sodium  100 mg Oral Daily  . feeding supplement (ENSURE ENLIVE)  237 mL Oral BID BM  . heparin injection (subcutaneous)  5,000 Units Subcutaneous Q8H  . lidocaine  1 patch Transdermal Q24H  . lidocaine      . pantoprazole (PROTONIX) IV  40 mg Intravenous Q24H  . polyethylene glycol  17 g Oral BID  . senna-docusate  1 tablet Oral BID  . sodium chloride flush  3 mL Intravenous Once      Otelia Santee, MD 02/19/2020, 8:10 AM

## 2020-02-19 NOTE — Progress Notes (Signed)
Inpatient Rehabilitation Admissions Coordinator  We will follow up next week as we await medical workup and progress to assist with planning dispo when appropriate.  Danne Baxter, RN, MSN Rehab Admissions Coordinator [phone] 02/19/2020 3:08 PM

## 2020-02-19 NOTE — Progress Notes (Signed)
PT Cancellation Note  Patient Details Name: Trevor Shelton MRN: [id number] DOB: [birth year]   Cancelled Treatment:    Reason Eval/Treat Not Completed: Patient at procedure or test/unavailable. Will re-attempt later if time allows.    Shacara Cozine 02/19/2020, 9:29 AM

## 2020-02-19 NOTE — Plan of Care (Signed)

## 2020-02-19 NOTE — Progress Notes (Addendum)
Patient is scheduled for a radiation treatment tomorrow 02/20/2020 at 10:00 am.  Spoke with patient's nurse Gae Bon to let her know to set up transportation via [facility] to have the patient here at 9:45 am.  Will continue to follow as necessary.  Gloriajean Dell. Leonie Green, BSN

## 2020-02-19 NOTE — Progress Notes (Signed)
Physical Therapy Treatment Patient Details Name: Trevor Shelton MRN: [id number] DOB: 06-28-1966 Today's Date: 02/19/2020    History of Present Illness Patient is a 54 y/o male who presents with generalized weakness, rib pain, back pain. Admitted with hypercalcemia and AKI. Abdominal CT- lytic lesions throughout skeleton consistent with multiple myeloma or metastatic disease. Pathological fxs T5, T9 and acute rib fxs left 9-10th. No PMH.    PT Comments    Interpreter, Spero Geralds utilized for session. Patient received in bed. Had bone marrow procedure this morning. Patient reports 9/10 pain in ribs and back. He declines bed mobility or attempts at oob activity this morning due to severe pain. He agrees to LE strengthening only. Requires some assistance on L LE due to increased weakness. Patient and wife had a lot of questions regarding progress, and diet. Spent extra time discussing concerns with patient and his wife who was present in room. Patient will continue to benefit from skilled PT while here to improve functional mobility to improve independence.       Follow Up Recommendations  CIR;Supervision/Assistance - 24 hour     Equipment Recommendations  Rolling walker with 5" wheels;3in1 (PT);Wheelchair (measurements PT);Wheelchair cushion (measurements PT)    Recommendations for Other Services Rehab consult     Precautions / Restrictions Precautions Precautions: Fall Precaution Comments: marked weakness BLEs L >R Restrictions Weight Bearing Restrictions: No    Mobility  Bed Mobility               General bed mobility comments: patient unwilling to attempt bed mobility due to pain level. He reports he went for test this morning and moved a lot and is now very painful.  Transfers                 General transfer comment: unable to attempt due to high pain level  Ambulation/Gait             General Gait Details: unable to attempt   Stairs              Wheelchair Mobility    Modified Rankin (Stroke Patients Only)       Balance                                            Cognition Arousal/Alertness: Awake/alert Behavior During Therapy: WFL for tasks assessed/performed Overall Cognitive Status: Within Functional Limits for tasks assessed                                        Exercises Other Exercises Other Exercises: B LE exercises: AP, heel slides, hip abd/add, SAQ, SLR x 10 reps each. Requires assist on left for proper technique due to weakness.    General Comments        Pertinent Vitals/Pain Pain Assessment: 0-10 Pain Score: 9  Pain Location: back, ribs Pain Descriptors / Indicators: Discomfort;Sore;Guarding;Grimacing Pain Intervention(s): Monitored during session;Limited activity within patient's tolerance;Premedicated before session    Home Living                      Prior Function            PT Goals (current goals can now be found in the care plan section) Acute Rehab PT Goals Patient Stated Goal:  less pain PT Goal Formulation: With patient/family Time For Goal Achievement: 02/29/20 Potential to Achieve Goals: Fair Progress towards PT goals: Not progressing toward goals - comment (increased pain level this day prohibits patient from progress)    Frequency    Min 3X/week      PT Plan Current plan remains appropriate    Co-evaluation              AM-PAC PT "6 Clicks" Mobility   Outcome Measure  Help needed turning from your back to your side while in a flat bed without using bedrails?: Total Help needed moving from lying on your back to sitting on the side of a flat bed without using bedrails?: Total Help needed moving to and from a bed to a chair (including a wheelchair)?: Total Help needed standing up from a chair using your arms (e.g., wheelchair or bedside chair)?: Total Help needed to walk in hospital room?: Total Help needed climbing  3-5 steps with a railing? : Total 6 Click Score: 6    End of Session   Activity Tolerance: Patient limited by pain Patient left: in bed;with call bell/phone within reach;with family/visitor present Nurse Communication: Mobility status PT Visit Diagnosis: Pain;Muscle weakness (generalized) (M62.81);Unsteadiness on feet (R26.81);Difficulty in walking, not elsewhere classified (R26.2);Other abnormalities of gait and mobility (R26.89) Pain - part of body:  (ribs, back)     Time: 3335-4562 PT Time Calculation (min) (ACUTE ONLY): 26 min  Charges:  $Therapeutic Exercise: 23-37 mins                     Sakiyah Shur, PT, GCS 02/19/20,1:00 PM

## 2020-02-19 NOTE — Progress Notes (Signed)
Spoke with the patient's nurse Gae Bon, to let her know that the patient is scheduled for a radiation treatment today.  I asked that she call [facility] to have the patient here at 4:45 pm.  She verbalized understanding.  Spoke with Phil at [facility] to give them a heads up that [facility] would [facility] calling for [facility].  I also informed the nurse that the patient would have a radiation appointment tomorrow, time to be determined.  Will continue to follow as necessary.  Gloriajean Dell. Leonie Green, BSN

## 2020-02-20 ENCOUNTER — Ambulatory Visit
Admit: 2020-02-20 | Discharge: 2020-02-20 | Disposition: A | Discharge status: Home / Self Care | Payer: Self-pay | Attending: Radiation Oncology | Admitting: Radiation Oncology

## 2020-02-20 LAB — COMPREHENSIVE METABOLIC PANEL
ALT: 55 U/L — ABNORMAL HIGH (ref 0–44)
AST: 63 U/L — ABNORMAL HIGH (ref 15–41)
Albumin: 2.2 g/dL — ABNORMAL LOW (ref 3.5–5.0)
Alkaline Phosphatase: 63 U/L (ref 38–126)
Anion gap: 8 (ref 5–15)
BUN: 33 mg/dL — ABNORMAL HIGH (ref 6–20)
CO2: 24 mmol/L (ref 22–32)
Calcium: 8.9 mg/dL (ref 8.9–10.3)
Chloride: 102 mmol/L (ref 98–111)
Creatinine, Ser: 2.69 mg/dL — ABNORMAL HIGH (ref 0.61–1.24)
GFR calc Af Amer: 29 mL/min — ABNORMAL LOW (ref >60)
GFR calc non Af Amer: 25 mL/min — ABNORMAL LOW (ref >60)
Glucose, Bld: 145 mg/dL — ABNORMAL HIGH (ref 70–99)
Potassium: 4.1 mmol/L (ref 3.5–5.1)
Sodium: 134 mmol/L — ABNORMAL LOW (ref 135–145)
Total Bilirubin: 0.4 mg/dL (ref 0.3–1.2)
Total Protein: 5.2 g/dL — ABNORMAL LOW (ref 6.5–8.1)

## 2020-02-20 LAB — CBC WITH DIFFERENTIAL/PLATELET
Abs Immature Granulocytes: 0.12 10*3/uL — ABNORMAL HIGH (ref 0.00–0.07)
Basophils Absolute: 0 10*3/uL (ref 0.0–0.1)
Basophils Relative: 0 %
Eosinophils Absolute: 0 10*3/uL (ref 0.0–0.5)
Eosinophils Relative: 0 %
HCT: 28.4 % — ABNORMAL LOW (ref 39.0–52.0)
Hemoglobin: 9.9 g/dL — ABNORMAL LOW (ref 13.0–17.0)
Immature Granulocytes: 2 %
Lymphocytes Relative: 5 %
Lymphs Abs: 0.4 10*3/uL — ABNORMAL LOW (ref 0.7–4.0)
MCH: 29.7 pg (ref 26.0–34.0)
MCHC: 34.9 g/dL (ref 30.0–36.0)
MCV: 85.3 fL (ref 80.0–100.0)
Monocytes Absolute: 0.5 10*3/uL (ref 0.1–1.0)
Monocytes Relative: 6 %
Neutro Abs: 6.9 10*3/uL (ref 1.7–7.7)
Neutrophils Relative %: 87 %
Platelets: 188 10*3/uL (ref 150–400)
RBC: 3.33 MIL/uL — ABNORMAL LOW (ref 4.22–5.81)
RDW: 11.3 % — ABNORMAL LOW (ref 11.5–15.5)
WBC: 7.9 10*3/uL (ref 4.0–10.5)
nRBC: 0 % (ref 0.0–0.2)

## 2020-02-20 LAB — PHOSPHORUS: Phosphorus: 3 mg/dL (ref 2.5–4.6)

## 2020-02-20 LAB — BETA 2 MICROGLOBULIN, SERUM: Beta-2 Microglobulin: 2.9 mg/L — ABNORMAL HIGH (ref 0.6–2.4)

## 2020-02-20 LAB — MAGNESIUM: Magnesium: 1.9 mg/dL (ref 1.7–2.4)

## 2020-02-20 MED ORDER — POLYETHYLENE GLYCOL 3350 17 G PO PACK
17.0000 g | PACK | Freq: Every day | ORAL | Status: DC | PRN
  Administered 2020-02-21 – 2020-02-24 (×2): 17 g via ORAL
  Filled 2020-02-20 (×2): qty 1

## 2020-02-20 MED ORDER — SENNOSIDES-DOCUSATE SODIUM 8.6-50 MG PO TABS
1.0000 | ORAL_TABLET | Freq: Every evening | ORAL | Status: DC | PRN
  Administered 2020-02-27 – 2020-02-28 (×2): 1 via ORAL
  Filled 2020-02-20: qty 1

## 2020-02-20 NOTE — Progress Notes (Signed)
Physical Therapy Treatment Patient Details Name: Trevor Shelton MRN: [id number] DOB: 07-03-1966 Today's Date: 02/20/2020    History of Present Illness Patient is a 54 y/o male who presents with generalized weakness, rib pain, back pain. Admitted with hypercalcemia and AKI. Abdominal CT- lytic lesions throughout skeleton consistent with multiple myeloma or metastatic disease. Pathological fxs T5, T9 and acute rib fxs left 9-10th. No PMH.    PT Comments    On arrival to room pt reporting actively having BM on chuck pad in bed. He is agreeable to attempt transfer to [facility]. Pt was able to progress to [facility] using RW, however LEs became tangled requiring max A +2 to prevent fall and safely sit on BSC. Utilized stedy for subsequent transfer to recliner chair. Pt required total A for peri care. Pt appears to be able to power up well but is loosing ability to coordinate movements in his LEs, at times LE appear to have uncontrolled spasmodic movement. Feel the CIR would be appropriate venue to progress pt and address his current functional limitation. Will continue to follow acutely.   [place] interpreter [phone] used for this session.     Follow Up Recommendations  CIR;Supervision/Assistance - 24 hour     Equipment Recommendations  Rolling walker with 5" wheels;3in1 (PT);Wheelchair (measurements PT);Wheelchair cushion (measurements PT)    Recommendations for Other Services Rehab consult     Precautions / Restrictions Precautions Precautions: Fall Precaution Comments: marked weakness BLEs L >R    Mobility  Bed Mobility Overal bed mobility: Needs Assistance Bed Mobility: Supine to Sit     Supine to sit: Mod assist     General bed mobility comments: HOB elevated and use of bed rails. Pt able to progress LEs off EOB. Assist given for trunk elevation. Increased time and effort required.  Transfers Overall transfer level: Needs assistance Equipment used: Rolling walker (2 wheeled) Transfers: Sit  to/from [facility] Sit to Stand: Mod assist;+2 physical assistance Stand pivot transfers: Max assist;+2 physical assistance;+2 safety/equipment       General transfer comment: Pt able to rise with mod A +2 using RW, However when attempting to pivot LE's became crossed as LLE moved but the RLE lagged behind. Max A +2 to safely land on the [facility]. Used stedy for subsequent transfer from [facility] to recliner chair.  Ambulation/Gait             General Gait Details: unable to safely attempt   Stairs             Wheelchair Mobility    Modified Rankin (Stroke Patients Only)       Balance Overall balance assessment: Needs assistance Sitting-balance support: Feet supported;Bilateral upper extremity supported Sitting balance-Leahy Scale: Poor Sitting balance - Comments: Reliant on BUE support, close supervision - min guard   Standing balance support: Bilateral upper extremity supported;During functional activity Standing balance-Leahy Scale: Poor Standing balance comment: reliant on UE support                            Cognition Arousal/Alertness: Awake/alert Behavior During Therapy: WFL for tasks assessed/performed Overall Cognitive Status: Within Functional Limits for tasks assessed                                        Exercises Other Exercises Other Exercises: B LE exercises: AP, heel slides, hip abd/add,  SAQ, SLR x 10 reps each. Requires assist on left for proper technique due to weakness.    General Comments General comments (skin integrity, edema, etc.): Wife present for session.       Pertinent Vitals/Pain Pain Assessment: Faces Faces Pain Scale: Hurts even more Pain Location: back, ribs Pain Descriptors / Indicators: Discomfort;Sore;Guarding;Grimacing Pain Intervention(s): Monitored during session;Limited activity within patient's tolerance;Repositioned    Home Living                      Prior Function             PT Goals (current goals can now be found in the care plan section) Acute Rehab PT Goals Patient Stated Goal: less pain PT Goal Formulation: With patient/family Time For Goal Achievement: 02/29/20 Potential to Achieve Goals: Fair Progress towards PT goals: Not progressing toward goals - comment (pt appears to be in decline.)    Frequency    Min 3X/week      PT Plan Current plan remains appropriate    Co-evaluation              AM-PAC PT "6 Clicks" Mobility   Outcome Measure  Help needed turning from your back to your side while in a flat bed without using bedrails?: Total Help needed moving from lying on your back to sitting on the side of a flat bed without using bedrails?: Total Help needed moving to and from a bed to a chair (including a wheelchair)?: Total Help needed standing up from a chair using your arms (e.g., wheelchair or bedside chair)?: Total Help needed to walk in hospital room?: Total Help needed climbing 3-5 steps with a railing? : Total 6 Click Score: 6    End of Session Equipment Utilized During Treatment: Gait belt Activity Tolerance: Patient limited by pain Patient left: with call bell/phone within reach;with family/visitor present;in chair Nurse Communication: Mobility status;Need for lift equipment PT Visit Diagnosis: Pain;Muscle weakness (generalized) (M62.81);Unsteadiness on feet (R26.81);Difficulty in walking, not elsewhere classified (R26.2);Other abnormalities of gait and mobility (R26.89) Pain - part of body:  (ribs, back)     Time: 2800-3491 PT Time Calculation (min) (ACUTE ONLY): 55 min  Charges:  $Therapeutic Activity: 53-67 mins                     Benjiman Core, [place] Pager [phone] Acute Rehab  Allena Katz 02/20/2020, 3:30 PM

## 2020-02-20 NOTE — Progress Notes (Signed)
[facility] Therapy Treatment Record Phone [phone]   Radiation Therapy was administered to Trevor Shelton on: 02/20/2020  9:51 AM and was treatment # 3 out of a planned course of 10 treatments.  Radiation Treatment  1). Beam photons with 6-10 energy  2). Brachytherapy None  3). Stereotactic Radiosurgery None  4). Other Radiation None     Trudee Kuster, RT (T)

## 2020-02-20 NOTE — Progress Notes (Signed)
PROGRESS NOTE    [name]  ZOX:[id number] DOB: 08-23-1965 DOA: 02/14/2020 PCP: Patient, No Pcp Per   Brief Narrative:  The patient is a 54 year old Spanish-speaking male with no real past medical history who presented to the ED with generalized weakness, left rib pain, lower back pain and constipation for a month.  Patient son was at bedside during admission and told most of the story.  Patient fell off a ladder from a height of about 4 feet about a month ago and landed on his left side and since then he started having back pain, left rib pain and weakness in bilateral extremities which is subsequently getting worse.  He presented to the ED and had a work-up and was found to have an elevated WBC of 13.3.  Is also noted to have sodium 125, severe AKI, calcium more than 15,.  CT of the abdomen pelvis showed diffuse lytic lesions throughout the skeleton consistent with multiple myeloma or metastatic disease.  He also was noted to have pathological fractures at T5 and T9, and acute fracture left rib 9 and 10, the EDP consulted neurosurgery and oncology.  Oncology was recommended to check SPEP and UPEP and tried hospitalist will admit this patient for hypercalcemia and AKI with suspected multiple myeloma.  Neurosurgery spoke with the EDP and then felt from a neurosurgical standpoint in the setting of multiple myeloma, there are few surgical indications and management was typically radiation.  They felt that they could explore the possibility of kyphoplasty.  At the time of admission medical oncology was consulted and they felt that patient's diagnosis of multiple myeloma was not confirmed and they felt that he did not need to be transferred to [facility].  Medical oncology recommended SPEP and UPEP to confirm diagnosis of multiple myeloma.  Patient does have an M Spike and Medical Oncology was consulted for further evaluation and recommendations.  Neurosurgery recommended the patient get treated with IV  dexamethasone and recommended radiation oncology evaluated.  Radiation oncology was consulted and patient went for simulation and underwent his first radiation treatment today.  He is pending a bone marrow biopsy and this will be done tomorrow and then patient will be dialyzed again later.  Biopsy will be done to help confirm the diagnosis of multiple myeloma as SPEP showed M protein of 0.9, and serum fixation light chains and UPEP is still pending.  Medical oncology agrees with radiation to the spine given the tumor extensions at T6 and T9 has a bone marrow biopsy cannot confirm diagnosis recommend biopsy of the soft tissue spinal mass.  Patient was started on bisphosphonate therapy and is continue to be hydrated.  Neurosurgery is recommending reducing dexamethasone 4 mg every 8 hours on 02/22/2020 and continue dexamethasone through the conclusion of his radiation and can taper.  Patient's renal function is slightly improved from yesterday during his hemodialysis session.  If no light chains in the urine then myeloma kidney is less likely per nephrology.  We will continue to hydrate per nephrology recommendations  Patient is Kappa free light chain was elevated but we still do not have a definite diagnosis of myeloma and he underwent a biopsy today.  After his biopsy he underwent dialysis again today and they will give him a break and reassess Monday to see if there is any indication of renal recovery.  After his dialysis he went to radiation therapy.  PT OT recommending CIR so we will follow up with CIR rehab coordinator next week.  **02/20/20  Patient was asking when his biopsy results will be back and this is still pending.  Also states that his leg feels a little bit better today.  Went to radiation today.  Complaining that he is having more diarrhea today than he did yesterday and will cut back on his laxatives.  His IV fluids were stopped by nephrology today and they will not dialyze him this weekend.  His  calcium is now down to 8.9.  Assessment & Plan:   Principal Problem:   Hypercalcemia Active Problems:   AKI (acute kidney injury) ([place])   Pathologic fracture   Hyponatremia   Leukocytosis   Kappa light chain myeloma (HCC)  Hypercalcemia withT6, T9pathological fracture and severeAKI; concern for Multiple Myeloma (Kappa) -Likely secondary to multiple myeloma but we do not have a confirmed diagnosis and he will be going for a bone marrow biopsy tomorrow. -Reviewed CT chest, abdomen/pelvis result.  -MRI thoracic spine showed "Numerous bone marrow lesions throughout all visualized levels, but worst at T5, T6 and T9. 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 The T6 lesion severely narrows the thecal sac and deforms the spinal cord. 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 Pathologic fractures of T6 and T9 with 3-5 mm of retropulsion." ;  Patient continues to have some good strength in his legs but complains of them hurting. -Neuro-surgery evaluated initially and from a neurosurgical standpoint they do not feel that he is a surgical indication and they feel the management is typically radiation.  Dr. Marcello Moores did advise that if the patient is a good candidate for kyphoplasty this could be explored. Will discuss the case again with Dr. Marcello Moores and Dr. Marcello Moores evaluated and he feels that neurosurgical intervention is not necessary at this time and recommend starting the patient on IV dexamethasone 6 mg every 6 hours and then tapering down to 4 mg every 8 hours on 02/22/2020 and continuing the dexamethasone through the duration of radiation.  He also recommended radiation oncology be involved and patient was simulated and had his first radiation treatment today. -Patient received IV fluid and Dilaudid in the ED. -Admitted patient on the floor.  -EDP consulted neurosurgery and oncology -OncologyDr. Shadad-recommended to check UPEP and  SPEP-to confirm multiple myeloma and oncology will get involved at that point. Patient has an M Spike of 0.9 Total Protein of 5.8, and Kappa/Lambda Light chains are now showing an elevation as his kappa free light chains with a value of 37.5 and a lambda free light chain of 9.9 and a kappa/lambda light chain ratio of 3.79.  Likely this is a kappa free light chain multiple myeloma and his UPEP is still pending as well as his bone marrow biopsy to confirm diagnosis but his spine lesions could be from a plasmacytoma -C/w Antiemetic, pain control -Nephrology following- concern for myeloma and he underwent dialysis the day before yesterday given uremic symptoms and is scheduled for dialysis again today -Continue with aggressive IV fluid for hydration with isotonic fluids  -Started on calcitonin and getting 290 units sq BID x2 Doses  -Given renal function nephrology will give pamidronate 30 mg IV x1 -PTH is low consistent which rules out primary hyperparathyroid; appropriate response for level of hypercalcemia. -Lidocaine patch for rib pain -Ca2+ went from >15.0 -> 13.5 -> 14.1 -> 11.1 -> 10.9 and today is 8.9 -Medical Oncology consulted for further evaluation and recommends and bone marrow biopsy and this was done on Friday, 02/19/2020 -  Appreciate multidisciplinary involvement of medical oncology, radiation oncology, neurosurgery, interventional radiology, nephrology and will follow up on specialist recommendations; he underwent a bone marrow biopsy yesterday and awaiting for the biopsy results Nephrology has not been a dialysis the patient this weekend and I stopped his fluids given that his calcium is improved.  AKI, slightly improved with dialysis Metabolic acidosis -Likely in the setting of underlying multiple myeloma prerenal azotemia +/- ATN -Nephrology following and feels likely in the setting of multiple myeloma -Avoid nephrotoxic medications -Patient having Uremic Symptoms so Nephrology will  initiate HD and he had his first dialysis session 02/17/2020 -Continue IV fluids for hydration (Getting NS at 150 mL/hr), strict I's and O's, daily weight, close monitoring urinary output and respiratory status -Patient now has a metabolic acidosis with a CO2 21, chloride level of 99, and anion gap of 10 -Nephrology feels that TPE may be true given that he has had symptoms for over a month and oncology does not feel it is likely necessary at this point given that his protein is not that high. -Check bladder scan to make sure patient is not retaining with all aggressive IV patient -His BUN/creatinine went from 65/4.68 -> 36/3.57 -> 52/4.10 and he got dialysis yesterday and his BUN/creatinine now is 33/2.69  Hyponatremia -Sodium of 125 on admission. -Now her sodium is 134 -Was started on IVF. Per nephrology likely in the event of pseudo from paraproteinemia. -We will check urine and serum osmolality, TSH, cortisol -Serum osmolality is high likely representing the paraprotein per Nephrology recc's  -Nephrology following and appreciate assistance   Leukocytosis -Likely reactive initially.  Improving slowly and now trended up in the setting of steroid demargination -WBC went from 13.3 -> 11.6 -> 10.9 -> 11.1 and today is trending down is 9.1 is now back up to 11.5 in the setting of steroids -Afebrile, no indication for antibiotics as there is no evidence of infection  -Continue to monitor and repeat CBC in the AM  Constipation, improving and now having diarrhea -Likely secondary to hypercalcemia due to underlying possible multiple myeloma -Bowel regimen was adjusted today and will be continued but will be made as needed given that he is having diarrhea now -Continue IV fluids. -Mobility as tolerated  Normocytic Anemia -Hgb/Hct went from 14.2/41.2 -> 10.9/31.6 and today his hemoglobin/hematocrit is 9.9/28.4 -? Dilutional Drop -Check Anemia Panel and showed an iron level of 38, U IBC 200,  TIBC of 238, saturation ratios of 16%, ferritin level 14, folate level 11.7, vitamin B12 level 413; Continue to Monitor for S/Sx of Bleeding -Currently no overt bleeding noted -Repeat CBC in the AM  Hyperglycemia in the setting of new onset type 2 diabetes mellitus -Check HbA1c in the AM and his hemoglobin A1c was 6.5 -Patient's Blood Sugars have been ranging from 116-158 on daily BMP/CMP's; expect blood sugars to elevate in the setting of steroid demargination -Continue to monitor blood sugars carefully -Continue to Monitor closely and if necessary will place on Sensitive Novolog SSI AC and placed on sliding scale  Hypomagnesemia -Patient magnesium level is 1.9 -Continue to monitor replete as necessary -Repeat magnesium level in a.m.  Abnormal LFTs  -Mildly elevated-patient's AST is gone from 30 and is now 63 and ALT is gone from 26 and now 55 -Continue monitor trend carefully and if necessary will obtain an acute hepatitis panel as well as a right upper quadrant ultrasound next-repeat CMP in a.m.  Obesity -Estimated body mass index is 30.3 kg/m as calculated from  the following:   Height as of this encounter: _0  (1.6 m).   Weight as of this encounter: 77.6 kg. -Weight Loss and Dietary Counseling given   DVT prophylaxis: Heparin 5000 units subcu every 8h Code Status: FULL CODE  Family Communication: Discussed with family present at bedside  Disposition Plan: Pending further evaluation by Medical Oncology and clearance by Nephrology  Status is: Inpatient  Remains inpatient appropriate because:Ongoing diagnostic testing needed not appropriate for outpatient work up, Unsafe d/c plan, IV treatments appropriate due to intensity of illness or inability to take PO and Inpatient level of care appropriate due to severity of illness   Dispo: The patient is from: Home              Anticipated d/c is to: TBD              Anticipated d/c date is: 2 days              Patient currently is  not medically stable to d/c.  Consultants:   Nephrology  Medical Oncology   IR  Procedures: [facility] placement with HD  Bone Marrow Bx  Antimicrobials:  Anti-infectives (From admission, onward)   Start     Dose/Rate Route Frequency Ordered Stop   02/17/20 0930  ceFAZolin (ANCEF) IVPB 2g/100 mL premix        2 g 200 mL/hr over 30 Minutes Intravenous To Radiology 02/17/20 0851 02/17/20 1034     Subjective: Seen and examined at bedside with the interpreter Rica Mote #[id number] and the patient states that he had a lot of questions and wanted to know what the results of his biopsy showed.  I stated that the results were not back yet.  He states that his leg is doing about the same as yesterday but he states it is improved from a few days ago.  Wanted to know when he would get his strength back.  Denies any chest pain, lightheadedness or dizziness.  Complains of his abdomen being more distended today.  States that he is having multiple bowel movements now.  No other concerns or complaints this time.  Objective: Vitals:   02/20/20 0445 02/20/20 0923 02/20/20 0925 02/20/20 1420  BP: (!) 142/76 (!) 152/72 (!) 152/72 (!) 155/79  Pulse: 72  92 93  Resp:   18 18  Temp: 98.2 F (36.8 C) 98.3 F (36.8 C) 98.3 F (36.8 C) 97.6 F (36.4 C)  TempSrc: Oral  Oral Oral  SpO2: 95%  97% 98%  Weight:      Height:        Intake/Output Summary (Last 24 hours) at 02/20/2020 1727 Last data filed at 02/20/2020 0820 Gross per 24 hour  Intake 240 ml  Output 200 ml  Net 40 ml   Filed Weights   02/19/20 0459 02/19/20 1255 02/19/20 1630  Weight: 77 kg 78.6 kg 77.6 kg   Examination: Physical Exam:  Constitutional: WN/WD obese Spanish-speaking male currently in no acute distress appears calm but had a lot of questions today. Eyes: Lids and conjunctivae normal, sclerae anicteric  ENMT: External Ears, Nose appear normal. Grossly normal hearing. Neck: Appears normal, supple, no cervical masses, normal ROM, no  appreciable thyromegaly; no JVD Respiratory: Diminished to auscultation bilaterally, no wheezing, rales, rhonchi or crackles. Normal respiratory effort and patient is not tachypenic. No accessory muscle use.  Unlabored breathing Cardiovascular: RRR, no murmurs / rubs / gallops. S1 and S2 auscultated.  Minimal extremity edema Abdomen: Soft, non-tender, distended secondary body  habitus. Bowel sounds positive.  GU: Deferred. Musculoskeletal: No clubbing / cyanosis of digits/nails. No joint deformity upper and lower extremities.  Still has a 2 out of 5 strength in his left lower extremity but has more motion today. Skin: No rashes, lesions, ulcers on limited skin evaluation. No induration; Warm and dry.  Neurologic: CN 2-12 grossly intact with no focal deficits. Romberg sign and cerebellar reflexes not assessed.  Psychiatric: Normal judgment and insight. Alert and oriented x 3. Normal mood and appropriate affect.   Data Reviewed: I have personally reviewed following labs and imaging studies  CBC: Recent Labs  Lab 02/17/20 0102 02/17/20 1800 02/18/20 0250 02/19/20 0322 02/20/20 0411  WBC 11.1* 10.2 9.1 11.5* 7.9  NEUTROABS  --   --  7.9* 10.3* 6.9  HGB 10.9* 11.3* 10.7* 10.7* 9.9*  HCT 31.6* 32.7* 30.8* 30.9* 28.4*  MCV 84.3 85.6 85.8 86.6 85.3  PLT 181 182 178 187 902   Basic Metabolic Panel: Recent Labs  Lab 02/15/20 0202 02/16/20 0028 02/17/20 0102 02/17/20 1800 02/18/20 0250 02/19/20 0322 02/20/20 0411  NA 130*   < > 129* 129* 135 130* 134*  K 4.9   < > 4.6 4.2 4.1 4.5 4.1  CL 89*   < > 96* 97* 101 99 102  CO2 32   < > _0 21* 24  GLUCOSE 116*   < > 123* 95 102* 147* 145*  BUN 81*   < > 67* 65* 36* 52* 33*  CREATININE 4.92*   < > 4.74* 4.68* 3.57* 4.10* 2.69*  CALCIUM >15.0*   < > 14.1* 14.0* 11.1* 10.9* 8.9  MG 1.8  --   --   --  1.7 1.6* 1.9  PHOS 6.3*  --  5.5* 5.3* 3.0 3.6 3.0   < > = values in this interval not displayed.   GFR: Estimated Creatinine  Clearance: 28.3 mL/min (A) (by C-G formula based on SCr of 2.69 mg/dL (H)). Liver Function Tests: Recent Labs  Lab 02/14/20 1252 02/14/20 1252 02/15/20 0202 02/17/20 1800 02/18/20 0250 02/19/20 0322 02/20/20 0411  AST 34  --  28  --  29 30 63*  ALT 42  --  41  --  25 26 55*  ALKPHOS 66  --  58  --  55 61 63  BILITOT 1.3*  --  0.7  --  0.5 0.6 0.4  PROT 6.8  --  6.2*  --  5.4* 5.7* 5.2*  ALBUMIN 3.3*   < > 2.9* 2.4* 2.3* 2.4* 2.2*   < > = values in this interval not displayed.   Recent Labs  Lab 02/14/20 1252  LIPASE 28   No results for input(s): AMMONIA in the last 168 hours. Coagulation Profile: Recent Labs  Lab 02/15/20 1150 02/18/20 1132  INR 1.1 1.1   Cardiac Enzymes: No results for input(s): CKTOTAL, CKMB, CKMBINDEX, TROPONINI in the last 168 hours. BNP (last 3 results) No results for input(s): PROBNP in the last 8760 hours. HbA1C: Recent Labs    02/18/20 0250  HGBA1C 6.5*   CBG: No results for input(s): GLUCAP in the last 168 hours. Lipid Profile: No results for input(s): CHOL, HDL, LDLCALC, TRIG, CHOLHDL, LDLDIRECT in the last 72 hours. Thyroid Function Tests: No results for input(s): TSH, T4TOTAL, FREET4, T3FREE, THYROIDAB in the last 72 hours. Anemia Panel: Recent Labs    02/18/20 0250  VITAMINB12 413  FOLATE 11.7  FERRITIN 410*  TIBC 238*  IRON 38*  RETICCTPCT 1.1  Sepsis Labs: No results for input(s): PROCALCITON, LATICACIDVEN in the last 168 hours.  Recent Results (from the past 240 hour(s))  SARS Coronavirus 2 by RT PCR (hospital order, performed in [facility] hospital lab) Nasopharyngeal Nasopharyngeal Swab     Status: None   Collection Time: 02/14/20  4:05 PM   Specimen: Nasopharyngeal Swab  Result Value Ref Range Status   SARS Coronavirus 2 NEGATIVE NEGATIVE Final    Comment: (NOTE) SARS-CoV-2 target nucleic acids are NOT DETECTED.  The SARS-CoV-2 RNA is generally detectable in upper and lower respiratory specimens during the  acute phase of infection. The lowest concentration of SARS-CoV-2 viral copies this assay can detect is 250 copies / mL. A negative result does not preclude SARS-CoV-2 infection and should not be used as the sole basis for treatment or other patient management decisions.  A negative result may occur with improper specimen collection / handling, submission of specimen other than nasopharyngeal swab, presence of viral mutation(s) within the areas targeted by this assay, and inadequate number of viral copies (<250 copies / mL). A negative result must be combined with clinical observations, patient history, and epidemiological information.  Fact Sheet for Patients:   StrictlyIdeas.no  Fact Sheet for Healthcare Providers: BankingDealers.co.za  This test is not yet approved or  cleared by the [place] FDA and has been authorized for detection and/or diagnosis of SARS-CoV-2 by FDA under an Emergency Use Authorization (EUA).  This EUA will remain in effect (meaning this test can be used) for the duration of the COVID-19 declaration under Section 564(b)(1) of the Act, 21 U.S.C. section 360bbb-3(b)(1), unless the authorization is terminated or revoked sooner.  Performed at [facility], [place] [address], [place], [place] [address]     RN Pressure Injury Documentation:     Estimated body mass index is 30.3 kg/m as calculated from the following:   Height as of this encounter: _0  (1.6 m).   Weight as of this encounter: 77.6 kg.  Malnutrition Type:  Nutrition Problem: Increased nutrient needs Etiology: cancer and cancer related treatments   Malnutrition Characteristics:  Signs/Symptoms: estimated needs   Nutrition Interventions:  Interventions: Ensure Enlive (each supplement provides 350kcal and 20 grams of protein)   Radiology Studies: CT BONE MARROW BIOPSY & ASPIRATION  Result Date: 02/19/2020 CLINICAL DATA:  Clinical  suspicion multiple myeloma and need for bone marrow biopsy. EXAM: CT GUIDED BONE MARROW ASPIRATION AND BIOPSY ANESTHESIA/SEDATION: Versed 1.0 mg IV, Fentanyl 50 mcg IV Total Moderate Sedation Time:   14 minutes. The patient's level of consciousness and physiologic status were continuously monitored during the procedure by Radiology nursing. PROCEDURE: The procedure risks, benefits, and alternatives were explained to the patient. Questions regarding the procedure were encouraged and answered. The patient understands and consents to the procedure. A time out was performed prior to initiating the procedure. The right gluteal region was prepped with chlorhexidine. Sterile gown and sterile gloves were used for the procedure. Local anesthesia was provided with 1% Lidocaine. Under CT guidance, an 11 gauge On Control bone cutting needle was advanced from a posterior approach into the right iliac bone. Needle positioning was confirmed with CT. Initial non heparinized and heparinized aspirate samples were obtained of bone marrow. Core biopsy was performed via the On Control drill needle. COMPLICATIONS: None FINDINGS: Inspection of initial aspirate did reveal visible particles. Intact core biopsy sample was obtained. IMPRESSION: CT guided bone marrow biopsy of right posterior iliac bone with both aspirate and core samples obtained.  Electronically Signed   By: Aletta Edouard M.D.   On: 02/19/2020 12:21    Scheduled Meds:  Chlorhexidine Gluconate Cloth  6 each Topical Q0600   dexamethasone (DECADRON) injection  6 mg Intravenous Q6H   docusate sodium  100 mg Oral Daily   feeding supplement (ENSURE ENLIVE)  237 mL Oral BID BM   heparin injection (subcutaneous)  5,000 Units Subcutaneous Q8H   lidocaine  1 patch Transdermal Q24H   pantoprazole (PROTONIX) IV  40 mg Intravenous Q24H   polyethylene glycol  17 g Oral BID   senna-docusate  1 tablet Oral BID   sodium chloride flush  3 mL Intravenous Once    Continuous Infusions:  sodium chloride 100 mL/hr at 02/20/20 0510    LOS: 6 days   Kerney Elbe, DO [facility] PAGER is on AMION  If 7PM-7AM, please contact night-coverage [email or web address]

## 2020-02-20 NOTE — Progress Notes (Signed)
[facility] Progress Note   54 y.o.maleHTN p/wgeneralized weakness, left rib and lower back pain + constipation for a mth aftersustaininga fall from a ladder a mth ago. Pain is worse with deep inspiration and also has noted decreased urine output as well but denies obstructive like symptoms or incontinence.Noted on on CT to have diffuse lyticlesions throughout the skeletonand pathologicT5 and T9 , left 9th and 10th ribfractures.T6 level tumor extension narrowing thecal sac.He has been using Naproxen daily for the past 15 days.  Assessment/ Plan:   1. 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 Awaiting lab tests to confirm; no absolute indication for RRT at this time and no e/o obstruction on CT A/P.  - Hydratingaggressively with strict I&O's, daily weights and close monitoring of UOP and respiratorystatus.  -StartedRRT (hopefully temporarily) given uremic signs HD #1 on 8/11.Appreciate VIR placing the catheter promptly.  - Last  HD yesterday with 1L net UF (8/13)   Will give the weekend off; reassess Monday if there is any indication of renal recovery.  Checking urine 24hr UPEP; if no light chains in urine then myeloma kidney less likely.   2. Hypercalcemia - concerning for myeloma and at this time will hydrate aggressively with isotonic fluids. PTH is low consistent which rules out primary hyperparathyroid; appropriate response for level of hypercalcemia.  - Stopping the IVF 8/14 -Started on Calcitonin;given compromisedrenal function Given lower dosepamidronate. Improving trend -> now down to 8.9.  3. HTN - resume home regimen. 4. Hyponatremia - Will continue to monitor for now;  urine osm 402 and serum osmolality 302, TSH NL Some element of ADH release from nausea that was present earlier. Euvolemic. . 5. Constipation from prerenal as well as the hypercalcemia. He also has impingement but 4-5/5  strength in the legs.  Subjective:   Nausea resolved with dialysis, denies obstructive symptoms or dyspnea. Lots of UOP per pt  .   Objective:   BP (!) 152/72 (BP Location: Right Arm)   Pulse 92   Temp 98.3 F (36.8 C)   Resp 18   Ht '5\' 3"'  (1.6 m)   Wt 77.6 kg   SpO2 97%   BMI 30.30 kg/m   Intake/Output Summary (Last 24 hours) at 02/20/2020 1217 Last data filed at 02/20/2020 0820 Gross per 24 hour  Intake 240 ml  Output 1200 ml  Net -960 ml   Weight change: 1.6 kg  Physical Exam: GEN: NAD, A&Ox3, NCAT HEENT:Conjunctival pallor, EOMI NECK: Supple, no thyromegaly LUNGS: CTA B/L no rales, rhonchi or wheezing CV: RRR, No M/R/G ABD: SNDNT +BS  EXT: No lower extremity edema, left leg weaker than right ACCESS: RIJ TC (8/11)  Imaging: CT BONE MARROW BIOPSY & ASPIRATION  Result Date: 02/19/2020 CLINICAL DATA:  Clinical suspicion multiple myeloma and need for bone marrow biopsy. EXAM: CT GUIDED BONE MARROW ASPIRATION AND BIOPSY ANESTHESIA/SEDATION: Versed 1.0 mg IV, Fentanyl 50 mcg IV Total Moderate Sedation Time:   14 minutes. The patient's level of consciousness and physiologic status were continuously monitored during the procedure by Radiology nursing. PROCEDURE: The procedure risks, benefits, and alternatives were explained to the patient. Questions regarding the procedure were encouraged and answered. The patient understands and consents to the procedure. A time out was performed prior to initiating the procedure. The right gluteal region was prepped with chlorhexidine. Sterile gown and sterile gloves were used for the procedure. Local anesthesia was provided with 1% Lidocaine. Under CT guidance, an 55  gauge On Control bone cutting needle was advanced from a posterior approach into the right iliac bone. Needle positioning was confirmed with CT. Initial non heparinized and heparinized aspirate samples were obtained of bone marrow. Core biopsy was performed via the On Control drill  needle. COMPLICATIONS: None FINDINGS: Inspection of initial aspirate did reveal visible particles. Intact core biopsy sample was obtained. IMPRESSION: CT guided bone marrow biopsy of right posterior iliac bone with both aspirate and core samples obtained. Electronically Signed   By: Aletta Edouard M.D.   On: 02/19/2020 12:21    Labs: BMET Recent Labs  Lab 02/15/20 0202 02/16/20 0028 02/17/20 0102 02/17/20 1800 02/18/20 0250 02/19/20 0322 02/20/20 0411  NA 130* 128* 129* 129* 135 130* 134*  K 4.9 4.5 4.6 4.2 4.1 4.5 4.1  CL 89* 93* 96* 97* 101 99 102  CO2 32 '23 25 22 26 ' 21* 24  GLUCOSE 116* 116* 123* 95 102* 147* 145*  BUN 81* 76* 67* 65* 36* 52* 33*  CREATININE 4.92* 4.53* 4.74* 4.68* 3.57* 4.10* 2.69*  CALCIUM >15.0* 13.5* 14.1* 14.0* 11.1* 10.9* 8.9  PHOS 6.3*  --  5.5* 5.3* 3.0 3.6 3.0   CBC Recent Labs  Lab 02/17/20 1800 02/18/20 0250 02/19/20 0322 02/20/20 0411  WBC 10.2 9.1 11.5* 7.9  NEUTROABS  --  7.9* 10.3* 6.9  HGB 11.3* 10.7* 10.7* 9.9*  HCT 32.7* 30.8* 30.9* 28.4*  MCV 85.6 85.8 86.6 85.3  PLT 182 178 187 188    Medications:    . Chlorhexidine Gluconate Cloth  6 each Topical Q0600  . dexamethasone (DECADRON) injection  6 mg Intravenous Q6H  . docusate sodium  100 mg Oral Daily  . feeding supplement (ENSURE ENLIVE)  237 mL Oral BID BM  . heparin injection (subcutaneous)  5,000 Units Subcutaneous Q8H  . lidocaine  1 patch Transdermal Q24H  . pantoprazole (PROTONIX) IV  40 mg Intravenous Q24H  . polyethylene glycol  17 g Oral BID  . senna-docusate  1 tablet Oral BID  . sodium chloride flush  3 mL Intravenous Once      Otelia Santee, MD 02/20/2020, 12:17 PM

## 2020-02-21 LAB — CBC WITH DIFFERENTIAL/PLATELET
Abs Immature Granulocytes: 0.08 10*3/uL — ABNORMAL HIGH (ref 0.00–0.07)
Abs Immature Granulocytes: 0.12 10*3/uL — ABNORMAL HIGH (ref 0.00–0.07)
Basophils Absolute: 0 10*3/uL (ref 0.0–0.1)
Basophils Absolute: 0 10*3/uL (ref 0.0–0.1)
Basophils Relative: 0 %
Basophils Relative: 0 %
Eosinophils Absolute: 0 10*3/uL (ref 0.0–0.5)
Eosinophils Absolute: 0 10*3/uL (ref 0.0–0.5)
Eosinophils Relative: 0 %
Eosinophils Relative: 0 %
HCT: 27.5 % — ABNORMAL LOW (ref 39.0–52.0)
HCT: 27.4 % — ABNORMAL LOW (ref 39.0–52.0)
Hemoglobin: 9.3 g/dL — ABNORMAL LOW (ref 13.0–17.0)
Hemoglobin: 9.4 g/dL — ABNORMAL LOW (ref 13.0–17.0)
Immature Granulocytes: 1 %
Immature Granulocytes: 1 %
Lymphocytes Relative: 6 %
Lymphocytes Relative: 4 %
Lymphs Abs: 0.4 10*3/uL — ABNORMAL LOW (ref 0.7–4.0)
Lymphs Abs: 0.3 10*3/uL — ABNORMAL LOW (ref 0.7–4.0)
MCH: 29 pg (ref 26.0–34.0)
MCH: 29.1 pg (ref 26.0–34.0)
MCHC: 33.8 g/dL (ref 30.0–36.0)
MCHC: 34.3 g/dL (ref 30.0–36.0)
MCV: 85.7 fL (ref 80.0–100.0)
MCV: 84.8 fL (ref 80.0–100.0)
Monocytes Absolute: 0.6 10*3/uL (ref 0.1–1.0)
Monocytes Absolute: 0.3 10*3/uL (ref 0.1–1.0)
Monocytes Relative: 8 %
Monocytes Relative: 4 %
Neutro Abs: 6.1 10*3/uL (ref 1.7–7.7)
Neutro Abs: 7.6 10*3/uL (ref 1.7–7.7)
Neutrophils Relative %: 85 %
Neutrophils Relative %: 91 %
Platelets: 194 10*3/uL (ref 150–400)
Platelets: 190 10*3/uL (ref 150–400)
RBC: 3.21 MIL/uL — ABNORMAL LOW (ref 4.22–5.81)
RBC: 3.23 MIL/uL — ABNORMAL LOW (ref 4.22–5.81)
RDW: 11.3 % — ABNORMAL LOW (ref 11.5–15.5)
RDW: 11.2 % — ABNORMAL LOW (ref 11.5–15.5)
WBC: 7.2 10*3/uL (ref 4.0–10.5)
WBC: 8.4 10*3/uL (ref 4.0–10.5)
nRBC: 0 % (ref 0.0–0.2)
nRBC: 0 % (ref 0.0–0.2)

## 2020-02-21 LAB — COMPREHENSIVE METABOLIC PANEL
ALT: 60 U/L — ABNORMAL HIGH (ref 0–44)
ALT: 57 U/L — ABNORMAL HIGH (ref 0–44)
AST: 44 U/L — ABNORMAL HIGH (ref 15–41)
AST: 40 U/L (ref 15–41)
Albumin: 2.2 g/dL — ABNORMAL LOW (ref 3.5–5.0)
Albumin: 2.2 g/dL — ABNORMAL LOW (ref 3.5–5.0)
Alkaline Phosphatase: 64 U/L (ref 38–126)
Alkaline Phosphatase: 80 U/L (ref 38–126)
Anion gap: 8 (ref 5–15)
Anion gap: 9 (ref 5–15)
BUN: 52 mg/dL — ABNORMAL HIGH (ref 6–20)
BUN: 65 mg/dL — ABNORMAL HIGH (ref 6–20)
CO2: 22 mmol/L (ref 22–32)
CO2: 20 mmol/L — ABNORMAL LOW (ref 22–32)
Calcium: 9.1 mg/dL (ref 8.9–10.3)
Calcium: 9.1 mg/dL (ref 8.9–10.3)
Chloride: 105 mmol/L (ref 98–111)
Chloride: 103 mmol/L (ref 98–111)
Creatinine, Ser: 3 mg/dL — ABNORMAL HIGH (ref 0.61–1.24)
Creatinine, Ser: 2.86 mg/dL — ABNORMAL HIGH (ref 0.61–1.24)
GFR calc Af Amer: 26 mL/min — ABNORMAL LOW (ref >60)
GFR calc Af Amer: 27 mL/min — ABNORMAL LOW (ref >60)
GFR calc non Af Amer: 22 mL/min — ABNORMAL LOW (ref >60)
GFR calc non Af Amer: 24 mL/min — ABNORMAL LOW (ref >60)
Glucose, Bld: 129 mg/dL — ABNORMAL HIGH (ref 70–99)
Glucose, Bld: 161 mg/dL — ABNORMAL HIGH (ref 70–99)
Potassium: 4.1 mmol/L (ref 3.5–5.1)
Potassium: 4.7 mmol/L (ref 3.5–5.1)
Sodium: 135 mmol/L (ref 135–145)
Sodium: 132 mmol/L — ABNORMAL LOW (ref 135–145)
Total Bilirubin: 0.6 mg/dL (ref 0.3–1.2)
Total Bilirubin: 0.7 mg/dL (ref 0.3–1.2)
Total Protein: 5.3 g/dL — ABNORMAL LOW (ref 6.5–8.1)
Total Protein: 5.1 g/dL — ABNORMAL LOW (ref 6.5–8.1)

## 2020-02-21 LAB — PHOSPHORUS
Phosphorus: 3.5 mg/dL (ref 2.5–4.6)
Phosphorus: 3 mg/dL (ref 2.5–4.6)

## 2020-02-21 LAB — MAGNESIUM
Magnesium: 1.7 mg/dL (ref 1.7–2.4)
Magnesium: 1.8 mg/dL (ref 1.7–2.4)

## 2020-02-21 MED ORDER — PANTOPRAZOLE SODIUM 40 MG PO TBEC
40.0000 mg | DELAYED_RELEASE_TABLET | Freq: Every day | ORAL | Status: DC
  Administered 2020-02-22 – 2020-03-03 (×11): 40 mg via ORAL
  Filled 2020-02-21 (×11): qty 1

## 2020-02-21 MED ORDER — SIMETHICONE 80 MG PO CHEW
80.0000 mg | CHEWABLE_TABLET | Freq: Four times a day (QID) | ORAL | Status: DC | PRN
  Administered 2020-02-21 – 2020-03-02 (×9): 80 mg via ORAL
  Filled 2020-02-21 (×10): qty 1

## 2020-02-21 NOTE — Plan of Care (Signed)

## 2020-02-21 NOTE — Progress Notes (Signed)
[facility] Progress Note   54 y.o.maleHTN p/wgeneralized weakness, left rib and lower back pain + constipation for a mth aftersustaininga fall from a ladder a mth ago. Pain is worse with deep inspiration and also has noted decreased urine output as well but denies obstructive like symptoms or incontinence.Noted on on CT to have diffuse lyticlesions throughout the skeletonand pathologicT5 and T9 , left 9th and 10th ribfractures.T6 level tumor extension narrowing thecal sac.He has been using Naproxen daily for the past 15 days.  Assessment/ Plan:   1. 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 Awaiting lab tests to confirm; no absolute indication for RRT at this time and no e/o obstruction on CT A/P.  - Hydratingaggressively with strict I&O's, daily weights and close monitoring of UOP and respiratorystatus -> he's eating a little more now. Will stop the NS today.  -StartedRRT (hopefully temporarily) given uremic signs HD #1 on 8/11, #2 on 8/13. Appreciate VIR placing the catheter promptly.  Will give the weekend off; reassess Monday if there is any indication of renal recovery.  Will place order for strict I&O's; prior 24hr 950 mL recorded.  Checking urine 24hr UPEP; if no light chains in urine then myeloma kidney less likely.   2. Hypercalcemia - concerning for myeloma and at this time will hydrate aggressively with isotonic fluids. PTH is low consistent which rules out primary hyperparathyroid; appropriate response for level of hypercalcemia. - Stopping the IVF 8/14 -Started on Calcitonin;given compromisedrenal function +  lower dosepamidronate.Improving trend -> now down to 9.1.  3. HTN - resume home regimen. 4. Hyponatremia - Will continue to monitor for now;urine osm 402and serum osmolality302, TSHNL Some element of ADH release from nausea that was present earlier.  Euvolemic. . 5. Constipation from prerenal as well as the hypercalcemia. He also has impingement but 4-5/5 strength in the legs. Has received XRT #3/10 scheduled  Subjective:   Nausea resolved with dialysis, denies obstructive symptoms or dyspnea. Lots of UOP per pt  .   Objective:   BP (!) 147/80 (BP Location: Right Arm)   Pulse 68   Temp 98.5 F (36.9 C) (Oral)   Resp 15   Ht '5\' 3"'  (1.6 m)   Wt 77.6 kg   SpO2 98%   BMI 30.30 kg/m   Intake/Output Summary (Last 24 hours) at 02/21/2020 0841 Last data filed at 02/21/2020 0450 Gross per 24 hour  Intake 2741.23 ml  Output --  Net 2741.23 ml   Weight change:   Physical Exam: GEN: NAD, A&Ox3, NCAT HEENT:Conjunctival pallor, EOMI NECK: Supple, no thyromegaly LUNGS: CTA B/L no rales, rhonchi or wheezing CV: RRR, No M/R/G ABD: SNDNT +BS  EXT: No lower extremity edema, left leg weaker than right ACCESS: RIJ TC (8/11)  Imaging: CT BONE MARROW BIOPSY & ASPIRATION  Result Date: 02/19/2020 CLINICAL DATA:  Clinical suspicion multiple myeloma and need for bone marrow biopsy. EXAM: CT GUIDED BONE MARROW ASPIRATION AND BIOPSY ANESTHESIA/SEDATION: Versed 1.0 mg IV, Fentanyl 50 mcg IV Total Moderate Sedation Time:   14 minutes. The patient's level of consciousness and physiologic status were continuously monitored during the procedure by Radiology nursing. PROCEDURE: The procedure risks, benefits, and alternatives were explained to the patient. Questions regarding the procedure were encouraged and answered. The patient understands and consents to the procedure. A time out was performed prior to initiating the procedure. The right gluteal region was prepped with chlorhexidine. Sterile gown and sterile gloves were used for  the procedure. Local anesthesia was provided with 1% Lidocaine. Under CT guidance, an 11 gauge On Control bone cutting needle was advanced from a posterior approach into the right iliac bone. Needle positioning was confirmed with  CT. Initial non heparinized and heparinized aspirate samples were obtained of bone marrow. Core biopsy was performed via the On Control drill needle. COMPLICATIONS: None FINDINGS: Inspection of initial aspirate did reveal visible particles. Intact core biopsy sample was obtained. IMPRESSION: CT guided bone marrow biopsy of right posterior iliac bone with both aspirate and core samples obtained. Electronically Signed   By: Aletta Edouard M.D.   On: 02/19/2020 12:21    Labs: BMET Recent Labs  Lab 02/15/20 0202 02/15/20 0202 02/16/20 0028 02/17/20 0102 02/17/20 1800 02/18/20 0250 02/19/20 0322 02/20/20 0411 02/21/20 0219  NA 130*   < > 128* 129* 129* 135 130* 134* 135  K 4.9   < > 4.5 4.6 4.2 4.1 4.5 4.1 4.1  CL 89*   < > 93* 96* 97* 101 99 102 105  CO2 32   < > '23 25 22 26 ' 21* 24 22  GLUCOSE 116*   < > 116* 123* 95 102* 147* 145* 129*  BUN 81*   < > 76* 67* 65* 36* 52* 33* 52*  CREATININE 4.92*   < > 4.53* 4.74* 4.68* 3.57* 4.10* 2.69* 3.00*  CALCIUM >15.0*   < > 13.5* 14.1* 14.0* 11.1* 10.9* 8.9 9.1  PHOS 6.3*  --   --  5.5* 5.3* 3.0 3.6 3.0 3.5   < > = values in this interval not displayed.   CBC Recent Labs  Lab 02/18/20 0250 02/19/20 0322 02/20/20 0411 02/21/20 0219  WBC 9.1 11.5* 7.9 7.2  NEUTROABS 7.9* 10.3* 6.9 6.1  HGB 10.7* 10.7* 9.9* 9.3*  HCT 30.8* 30.9* 28.4* 27.5*  MCV 85.8 86.6 85.3 85.7  PLT 178 187 188 194    Medications:    . Chlorhexidine Gluconate Cloth  6 each Topical Q0600  . dexamethasone (DECADRON) injection  6 mg Intravenous Q6H  . feeding supplement (ENSURE ENLIVE)  237 mL Oral BID BM  . heparin injection (subcutaneous)  5,000 Units Subcutaneous Q8H  . lidocaine  1 patch Transdermal Q24H  . pantoprazole (PROTONIX) IV  40 mg Intravenous Q24H  . sodium chloride flush  3 mL Intravenous Once      Otelia Santee, MD 02/21/2020, 8:41 AM

## 2020-02-21 NOTE — Progress Notes (Signed)
PROGRESS NOTE    Trevor Shelton  XMI:[id number] DOB: 29-Nov-1965 DOA: 02/14/2020 PCP: Patient, No Pcp Per   Brief Narrative:  The patient is a 54 year old Spanish-speaking male with no real past medical history who presented to the ED with generalized weakness, left rib pain, lower back pain and constipation for a month.  Patient son was at bedside during admission and told most of the story.  Patient fell off a ladder from a height of about 4 feet about a month ago and landed on his left side and since then he started having back pain, left rib pain and weakness in bilateral extremities which is subsequently getting worse.  He presented to the ED and had a work-up and was found to have an elevated WBC of 13.3.  Is also noted to have sodium 125, severe AKI, calcium more than 15,.  CT of the abdomen pelvis showed diffuse lytic lesions throughout the skeleton consistent with multiple myeloma or metastatic disease.  He also was noted to have pathological fractures at T5 and T9, and acute fracture left rib 9 and 10, the EDP consulted neurosurgery and oncology.  Oncology was recommended to check SPEP and UPEP and tried hospitalist will admit this patient for hypercalcemia and AKI with suspected multiple myeloma.  Neurosurgery spoke with the EDP and then felt from a neurosurgical standpoint in the setting of multiple myeloma, there are few surgical indications and management was typically radiation.  They felt that they could explore the possibility of kyphoplasty.  At the time of admission medical oncology was consulted and they felt that patient's diagnosis of multiple myeloma was not confirmed and they felt that he did not need to be transferred to [facility].  Medical oncology recommended SPEP and UPEP to confirm diagnosis of multiple myeloma.  Patient does have an M Spike and Medical Oncology was consulted for further evaluation and recommendations.  Neurosurgery recommended the patient get treated with IV  dexamethasone and recommended radiation oncology evaluated.  Radiation oncology was consulted and patient went for simulation and underwent his first radiation treatment today.  He is pending a bone marrow biopsy and this will be done tomorrow and then patient will be dialyzed again later.  Biopsy will be done to help confirm the diagnosis of multiple myeloma as SPEP showed M protein of 0.9, and serum fixation light chains and UPEP is still pending.  Medical oncology agrees with radiation to the spine given the tumor extensions at T6 and T9 has a bone marrow biopsy cannot confirm diagnosis recommend biopsy of the soft tissue spinal mass.  Patient was started on bisphosphonate therapy and is continue to be hydrated.  Neurosurgery is recommending reducing dexamethasone 4 mg every 8 hours on 02/22/2020 and continue dexamethasone through the conclusion of his radiation and can taper.  Patient's renal function is slightly improved from yesterday during his hemodialysis session.  If no light chains in the urine then myeloma kidney is less likely per nephrology.  We will continue to hydrate per nephrology recommendations  Patient is Kappa free light chain was elevated but we still do not have a definite diagnosis of myeloma and he underwent a biopsy today.  After his biopsy he underwent dialysis again today and they will give him a break and reassess Monday to see if there is any indication of renal recovery.  After his dialysis he went to radiation therapy.  PT OT recommending CIR so we will follow up with CIR rehab coordinator next week.  **02/20/20  Patient was asking when his biopsy results will be back and this is still pending.  Also states that his leg feels a little bit better today.  Went to radiation today.  Complaining that he is having more diarrhea today than he did yesterday and will cut back on his laxatives.  His IV fluids were stopped by nephrology today and they will not dialyze him this weekend.  His  calcium is now down to 8.9.   02/21/20 Ca2+ is now 9.1. Bx results aren't back. Renal Fxn slightly worsening. Patient complaining of some gas pains.   Assessment & Plan:   Principal Problem:   Hypercalcemia Active Problems:   AKI (acute kidney injury) ([place])   Pathologic fracture   Hyponatremia   Leukocytosis   Kappa light chain myeloma (HCC)  Hypercalcemia withT6, T9pathological fracture and severeAKI; concern for Multiple Myeloma (Kappa) -Likely secondary to multiple myeloma but we do not have a confirmed diagnosis and he will be going for a bone marrow biopsy tomorrow. -Reviewed CT chest, abdomen/pelvis result.  -MRI thoracic spine showed "Numerous bone marrow lesions throughout all visualized levels, but worst at T5, T6 and T9. 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 The T6 lesion severely narrows the thecal sac and deforms the spinal cord. 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 Pathologic fractures of T6 and T9 with 3-5 mm of retropulsion." ;  Patient continues to have some good strength in his legs but complains of them hurting. -Neuro-surgery evaluated initially and from a neurosurgical standpoint they do not feel that he is a surgical indication and they feel the management is typically radiation.  Dr. Marcello Moores did advise that if the patient is a good candidate for kyphoplasty this could be explored. Will discuss the case again with Dr. Marcello Moores and Dr. Marcello Moores evaluated and he feels that neurosurgical intervention is not necessary at this time and recommend starting the patient on IV dexamethasone 6 mg every 6 hours and then tapering down to 4 mg every 8 hours on 02/22/2020 and continuing the dexamethasone through the duration of radiation.  He also recommended radiation oncology be involved and patient was simulated and had his first radiation treatment today. -Patient received IV fluid and Dilaudid in the  ED. -Admitted patient on the floor.  -EDP consulted neurosurgery and oncology -OncologyDr. Shadad-recommended to check UPEP and SPEP-to confirm multiple myeloma and oncology will get involved at that point. Patient has an M Spike of 0.9 Total Protein of 5.8, and Kappa/Lambda Light chains are now showing an elevation as his kappa free light chains with a value of 37.5 and a lambda free light chain of 9.9 and a kappa/lambda light chain ratio of 3.79.  Likely this is a kappa free light chain multiple myeloma and his UPEP is still pending as well as his bone marrow biopsy to confirm diagnosis but his spine lesions could be from a plasmacytoma -C/w Antiemetic, pain control -Nephrology following- concern for myeloma and he underwent dialysis the day before yesterday given uremic symptoms and is scheduled for dialysis again today -Continue with aggressive IV fluid for hydration with isotonic fluids  -Started on calcitonin and getting 290 units sq BID x2 Doses  -Given renal function nephrology will give pamidronate 30 mg IV x1 -PTH is low consistent which rules out primary hyperparathyroid; appropriate response for level of hypercalcemia. -Lidocaine patch for rib pain -Ca2+ went from >15.0 -> 13.5 -> 14.1 -> 11.1 -> 10.9 ->  8.9 -> 9.1 -Medical Oncology consulted for further evaluation and recommends and bone marrow biopsy and this was done on Friday, 02/19/2020 -Appreciate multidisciplinary involvement of medical oncology, radiation oncology, neurosurgery, interventional radiology, nephrology and will follow up on specialist recommendations; he underwent a bone marrow biopsy yesterday and awaiting for the biopsy results Nephrology has not been a dialysis the patient this weekend and I stopped his fluids given that his calcium is improved.  AKI, slightly improved with dialysis Metabolic acidosis -Likely in the setting of underlying multiple myeloma prerenal azotemia +/- ATN -Nephrology following and  feels likely in the setting of multiple myeloma -Avoid nephrotoxic medications -Patient having Uremic Symptoms so Nephrology will initiate HD and he had his first dialysis session 02/17/2020 -Continue IV fluids for hydration (Getting NS at 150 mL/hr), strict I's and O's, daily weight, close monitoring urinary output and respiratory status -Metabolic Acidosis is now resolved -Nephrology feels that TPE may be true given that he has had symptoms for over a month and oncology does not feel it is likely necessary at this point given that his protein is not that high. -Check bladder scan to make sure patient is not retaining with all aggressive IV patient -His BUN/creatinine went from 65/4.68 -> 36/3.57 -> 52/4.10 -> 33/2.69 and today is 52/3.00 -Will liberalize Diet and place on Regular Diet   Hyponatremia -Sodium of 125 on admission. -Now his sodium is now 135 -Was started on IVF but now stopepd. Per nephrology likely in the event of pseudo from paraproteinemia. -We will check urine and serum osmolality, TSH, cortisol -Serum osmolality is high likely representing the paraprotein per Nephrology recc's  -Nephrology following and appreciate assistance   Leukocytosis -Likely reactive initially.  Improving slowly and now trended up in the setting of steroid demargination -WBC has resolved  -Afebrile, no indication for antibiotics as there is no evidence of infection  -Continue to monitor and repeat CBC in the AM  Constipation, improving and now having diarrhea -Likely secondary to hypercalcemia due to underlying possible multiple myeloma -Bowel regimen was adjusted today and will be continued but will be made as needed given that he is having diarrhea now -Continued IV fluids but now stopped  -Mobility as tolerated  Normocytic Anemia -Hgb/Hct went from 14.2/41.2 -> 10.9/31.6 and today his hemoglobin/hematocrit is 9.3/27.5 -? Dilutional Drop -Check Anemia Panel and showed an iron level of  38, U IBC 200, TIBC of 238, saturation ratios of 16%, ferritin level 14, folate level 11.7, vitamin B12 level 413; Continue to Monitor for S/Sx of Bleeding -Currently no overt bleeding noted -Repeat CBC in the AM  Hyperglycemia in the setting of new onset type 2 diabetes mellitus -Check HbA1c in the AM and his hemoglobin A1c was 6.5 -Patient's Blood Sugars have been ranging from 116-158 on daily BMP/CMP's; expect blood sugars to elevate in the setting of steroid demargination -Continue to monitor blood sugars carefully -Continue to Monitor closely and if necessary will place on Sensitive Novolog SSI AC and placed on sliding scale  Hypomagnesemia -Patient magnesium level is 1.7 -Replete with IV Mag Sulfate 1 gram -Continue to monitor replete as necessary -Repeat magnesium level in a.m.  Abnormal LFTs  -Mildly elevated -patient's AST is gone from 30 -> 63 -> 44 and ALT is gone from 26 -> 55 -> 60 -Continue monitor trend carefully and if necessary will obtain an acute hepatitis panel as well as a right upper quadrant ultrasound next-repeat CMP in a.m.  Obesity -Estimated body mass index is  30.3 kg/m as calculated from the following:   Height as of this encounter: '5\' 3"'$  (1.6 m).   Weight as of this encounter: 77.6 kg. -Weight Loss and Dietary Counseling given   DVT prophylaxis: Heparin 5000 units subcu every 8h Code Status: FULL CODE  Family Communication: Discussed with family present at bedside  Disposition Plan: Pending further evaluation by Medical Oncology and clearance by Nephrology  Status is: Inpatient  Remains inpatient appropriate because:Ongoing diagnostic testing needed not appropriate for outpatient work up, Unsafe d/c plan, IV treatments appropriate due to intensity of illness or inability to take PO and Inpatient level of care appropriate due to severity of illness   Dispo: The patient is from: Home              Anticipated d/c is to: TBD              Anticipated  d/c date is: 2 days              Patient currently is not medically stable to d/c.  Consultants:   Nephrology  Medical Oncology   IR  Procedures: [facility] placement with HD  Bone Marrow Bx  Antimicrobials:  Anti-infectives (From admission, onward)   Start     Dose/Rate Route Frequency Ordered Stop   02/17/20 0930  ceFAZolin (ANCEF) IVPB 2g/100 mL premix        2 g 200 mL/hr over 30 Minutes Intravenous To Radiology 02/17/20 0851 02/17/20 1034     Subjective: Seen and examined at bedside with the translator [place] #[id number].  He states that he is feeling okay today and felt well.  No nausea or vomiting.  Thinks his leg is about the same but he has more motion.  No nausea or vomiting.  Asking about his fluid restriction.  Also complaining of some abdominal distention and some gas pains.  No chest pain, lightheadedness or dizziness.  No other concerns at this time.  Objective: Vitals:   02/20/20 1420 02/20/20 2031 02/21/20 0449 02/21/20 1351  BP: (!) 155/79 (!) 164/85 (!) 147/80 (!) 156/86  Pulse: 93 76 68 70  Resp: '18 16 15 16  '$ Temp: 97.6 F (36.4 C) 98.9 F (37.2 C) 98.5 F (36.9 C) 97.9 F (36.6 C)  TempSrc: Oral Oral Oral Oral  SpO2: 98% 98% 98% 98%  Weight:      Height:        Intake/Output Summary (Last 24 hours) at 02/21/2020 1433 Last data filed at 02/21/2020 1300 Gross per 24 hour  Intake 2981.23 ml  Output 900 ml  Net 2081.23 ml   Filed Weights   02/19/20 0459 02/19/20 1255 02/19/20 1630  Weight: 77 kg 78.6 kg 77.6 kg   Examination: Physical Exam:  Constitutional: WN/WD obese Spanish-speaking male currently no acute distress appears calm. Eyes: Lids and conjunctivae normal, sclerae anicteric  ENMT: External Ears, Nose appear normal. Grossly normal hearing. Neck: Appears normal, supple, no cervical masses, normal ROM, no appreciable thyromegaly; no JVD Respiratory: Diminished to auscultation bilaterally, no wheezing, rales, rhonchi or crackles. Normal  respiratory effort and patient is not tachypenic. No accessory muscle use.  Unlabored breathing Cardiovascular: RRR, no murmurs / rubs / gallops. S1 and S2 auscultated.  Mild edema Abdomen: Soft, non-tender, distended secondary body habitus and slightly hypertympanic. Bowel sounds positive.  GU: Deferred. Musculoskeletal: No clubbing / cyanosis of digits/nails. No joint deformity upper and lower extremities.  Skin: No rashes, lesions, ulcers on limited skin evaluation. No induration; Warm and dry.  Neurologic: CN 2-12 grossly intact with no focal deficits. Romberg sign cerebellar reflexes not assessed.  Psychiatric: Normal judgment and insight. Alert and oriented x 3. Normal mood and appropriate affect.   Data Reviewed: I have personally reviewed following labs and imaging studies  CBC: Recent Labs  Lab 02/17/20 1800 02/18/20 0250 02/19/20 0322 02/20/20 0411 02/21/20 0219  WBC 10.2 9.1 11.5* 7.9 7.2  NEUTROABS  --  7.9* 10.3* 6.9 6.1  HGB 11.3* 10.7* 10.7* 9.9* 9.3*  HCT 32.7* 30.8* 30.9* 28.4* 27.5*  MCV 85.6 85.8 86.6 85.3 85.7  PLT 182 178 187 188 277   Basic Metabolic Panel: Recent Labs  Lab 02/15/20 0202 02/16/20 0028 02/17/20 1800 02/18/20 0250 02/19/20 0322 02/20/20 0411 02/21/20 0219  NA 130*   < > 129* 135 130* 134* 135  K 4.9   < > 4.2 4.1 4.5 4.1 4.1  CL 89*   < > 97* 101 99 102 105  CO2 32   < > 22 26 21* 24 22  GLUCOSE 116*   < > 95 102* 147* 145* 129*  BUN 81*   < > 65* 36* 52* 33* 52*  CREATININE 4.92*   < > 4.68* 3.57* 4.10* 2.69* 3.00*  CALCIUM >15.0*   < > 14.0* 11.1* 10.9* 8.9 9.1  MG 1.8  --   --  1.7 1.6* 1.9 1.7  PHOS 6.3*   < > 5.3* 3.0 3.6 3.0 3.5   < > = values in this interval not displayed.   GFR: Estimated Creatinine Clearance: 25.4 mL/min (A) (by C-G formula based on SCr of 3 mg/dL (H)). Liver Function Tests: Recent Labs  Lab 02/15/20 0202 02/15/20 0202 02/17/20 1800 02/18/20 0250 02/19/20 0322 02/20/20 0411 02/21/20 0219  AST  28  --   --  29 30 63* 44*  ALT 41  --   --  25 26 55* 60*  ALKPHOS 58  --   --  55 61 63 64  BILITOT 0.7  --   --  0.5 0.6 0.4 0.6  PROT 6.2*  --   --  5.4* 5.7* 5.2* 5.3*  ALBUMIN 2.9*   < > 2.4* 2.3* 2.4* 2.2* 2.2*   < > = values in this interval not displayed.   No results for input(s): LIPASE, AMYLASE in the last 168 hours. No results for input(s): AMMONIA in the last 168 hours. Coagulation Profile: Recent Labs  Lab 02/15/20 1150 02/18/20 1132  INR 1.1 1.1   Cardiac Enzymes: No results for input(s): CKTOTAL, CKMB, CKMBINDEX, TROPONINI in the last 168 hours. BNP (last 3 results) No results for input(s): PROBNP in the last 8760 hours. HbA1C: No results for input(s): HGBA1C in the last 72 hours. CBG: No results for input(s): GLUCAP in the last 168 hours. Lipid Profile: No results for input(s): CHOL, HDL, LDLCALC, TRIG, CHOLHDL, LDLDIRECT in the last 72 hours. Thyroid Function Tests: No results for input(s): TSH, T4TOTAL, FREET4, T3FREE, THYROIDAB in the last 72 hours. Anemia Panel: No results for input(s): VITAMINB12, FOLATE, FERRITIN, TIBC, IRON, RETICCTPCT in the last 72 hours. Sepsis Labs: No results for input(s): PROCALCITON, LATICACIDVEN in the last 168 hours.  Recent Results (from the past 240 hour(s))  SARS Coronavirus 2 by RT PCR (hospital order, performed in [facility] hospital lab) Nasopharyngeal Nasopharyngeal Swab     Status: None   Collection Time: 02/14/20  4:05 PM   Specimen: Nasopharyngeal Swab  Result Value Ref Range Status   SARS Coronavirus 2 NEGATIVE NEGATIVE  Final    Comment: (NOTE) SARS-CoV-2 target nucleic acids are NOT DETECTED.  The SARS-CoV-2 RNA is generally detectable in upper and lower respiratory specimens during the acute phase of infection. The lowest concentration of SARS-CoV-2 viral copies this assay can detect is 250 copies / mL. A negative result does not preclude SARS-CoV-2 infection and should not be used as the sole basis for  treatment or other patient management decisions.  A negative result may occur with improper specimen collection / handling, submission of specimen other than nasopharyngeal swab, presence of viral mutation(s) within the areas targeted by this assay, and inadequate number of viral copies (<250 copies / mL). A negative result must be combined with clinical observations, patient history, and epidemiological information.  Fact Sheet for Patients:   StrictlyIdeas.no  Fact Sheet for Healthcare Providers: BankingDealers.co.za  This test is not yet approved or  cleared by the [place] FDA and has been authorized for detection and/or diagnosis of SARS-CoV-2 by FDA under an Emergency Use Authorization (EUA).  This EUA will remain in effect (meaning this test can be used) for the duration of the COVID-19 declaration under Section 564(b)(1) of the Act, 21 U.S.C. section 360bbb-3(b)(1), unless the authorization is terminated or revoked sooner.  Performed at [facility], [place] [address], [place], [place] [address]     RN Pressure Injury Documentation:     Estimated body mass index is 30.3 kg/m as calculated from the following:   Height as of this encounter: '5\' 3"'$  (1.6 m).   Weight as of this encounter: 77.6 kg.  Malnutrition Type:  Nutrition Problem: Increased nutrient needs Etiology: cancer and cancer related treatments   Malnutrition Characteristics:  Signs/Symptoms: estimated needs   Nutrition Interventions:  Interventions: Ensure Enlive (each supplement provides 350kcal and 20 grams of protein)   Radiology Studies: No results found.  Scheduled Meds: . Chlorhexidine Gluconate Cloth  6 each Topical Q0600  . dexamethasone (DECADRON) injection  6 mg Intravenous Q6H  . feeding supplement (ENSURE ENLIVE)  237 mL Oral BID BM  . heparin injection (subcutaneous)  5,000 Units Subcutaneous Q8H  . lidocaine  1 patch  Transdermal Q24H  . pantoprazole (PROTONIX) IV  40 mg Intravenous Q24H  . sodium chloride flush  3 mL Intravenous Once   Continuous Infusions:   LOS: 7 days   [place], DO [facility] PAGER is on AMION  If 7PM-7AM, please contact night-coverage [email or web address]

## 2020-02-22 ENCOUNTER — Ambulatory Visit
Admit: 2020-02-22 | Discharge: 2020-02-22 | Disposition: A | Discharge status: Home / Self Care | Payer: Self-pay | Attending: Radiation Oncology | Admitting: Radiation Oncology

## 2020-02-22 LAB — UPEP/UIFE/LIGHT CHAINS/TP, 24-HR UR
% BETA, Urine: 9.9 %
ALPHA 1 URINE: 12.4 %
Albumin, U: 53.1 %
Alpha 2, Urine: 20.9 %
Free Kappa Lt Chains,Ur: 26.29 mg/L (ref 0.63–113.79)
Free Kappa/Lambda Ratio: 8.29 (ref 1.03–31.76)
Free Lambda Lt Chains,Ur: 3.17 mg/L (ref 0.47–11.77)
GAMMA GLOBULIN URINE: 3.7 %
Total Protein, Urine-Ur/day: 277 mg/24 hr — ABNORMAL HIGH (ref 30–150)
Total Protein, Urine: 21.3 mg/dL
Total Volume: 1300

## 2020-02-22 LAB — UIFE/LIGHT CHAINS/TP QN, 24-HR UR
FR KAPPA LT CH,24HR: 34.18 mg/24 hr
FR LAMBDA LT CH,24HR: 4.17 mg/24 hr
Free Kappa Lt Chains,Ur: 26.29 mg/L (ref 0.63–113.79)
Free Kappa/Lambda Ratio: 8.19 (ref 1.03–31.76)
Free Lambda Lt Chains,Ur: 3.21 mg/L (ref 0.47–11.77)
Total Protein, Urine-Ur/day: 282 mg/24 hr — ABNORMAL HIGH (ref 30–150)
Total Protein, Urine: 21.7 mg/dL
Total Volume: 1300

## 2020-02-22 MED ORDER — DEXAMETHASONE SODIUM PHOSPHATE 4 MG/ML IJ SOLN
4.0000 mg | Freq: Three times a day (TID) | INTRAMUSCULAR | Status: DC
  Administered 2020-02-22 – 2020-03-03 (×30): 4 mg via INTRAVENOUS
  Filled 2020-02-22 (×32): qty 1

## 2020-02-22 NOTE — Progress Notes (Signed)
PROGRESS NOTE    Trevor Shelton  XMI:[id number] DOB: 29-Nov-1965 DOA: 02/14/2020 PCP: Patient, No Pcp Per   Brief Narrative:  The patient is a 54 year old Spanish-speaking male with no real past medical history who presented to the ED with generalized weakness, left rib pain, lower back pain and constipation for a month.  Patient son was at bedside during admission and told most of the story.  Patient fell off a ladder from a height of about 4 feet about a month ago and landed on his left side and since then he started having back pain, left rib pain and weakness in bilateral extremities which is subsequently getting worse.  He presented to the ED and had a work-up and was found to have an elevated WBC of 13.3.  Is also noted to have sodium 125, severe AKI, calcium more than 15,.  CT of the abdomen pelvis showed diffuse lytic lesions throughout the skeleton consistent with multiple myeloma or metastatic disease.  He also was noted to have pathological fractures at T5 and T9, and acute fracture left rib 9 and 10, the EDP consulted neurosurgery and oncology.  Oncology was recommended to check SPEP and UPEP and tried hospitalist will admit this patient for hypercalcemia and AKI with suspected multiple myeloma.  Neurosurgery spoke with the EDP and then felt from a neurosurgical standpoint in the setting of multiple myeloma, there are few surgical indications and management was typically radiation.  They felt that they could explore the possibility of kyphoplasty.  At the time of admission medical oncology was consulted and they felt that patient's diagnosis of multiple myeloma was not confirmed and they felt that he did not need to be transferred to [facility].  Medical oncology recommended SPEP and UPEP to confirm diagnosis of multiple myeloma.  Patient does have an M Spike and Medical Oncology was consulted for further evaluation and recommendations.  Neurosurgery recommended the patient get treated with IV  dexamethasone and recommended radiation oncology evaluated.  Radiation oncology was consulted and patient went for simulation and underwent his first radiation treatment today.  He is pending a bone marrow biopsy and this will be done tomorrow and then patient will be dialyzed again later.  Biopsy will be done to help confirm the diagnosis of multiple myeloma as SPEP showed M protein of 0.9, and serum fixation light chains and UPEP is still pending.  Medical oncology agrees with radiation to the spine given the tumor extensions at T6 and T9 has a bone marrow biopsy cannot confirm diagnosis recommend biopsy of the soft tissue spinal mass.  Patient was started on bisphosphonate therapy and is continue to be hydrated.  Neurosurgery is recommending reducing dexamethasone 4 mg every 8 hours on 02/22/2020 and continue dexamethasone through the conclusion of his radiation and can taper.  Patient's renal function is slightly improved from yesterday during his hemodialysis session.  If no light chains in the urine then myeloma kidney is less likely per nephrology.  We will continue to hydrate per nephrology recommendations  Patient is Kappa free light chain was elevated but we still do not have a definite diagnosis of myeloma and he underwent a biopsy today.  After his biopsy he underwent dialysis again today and they will give him a break and reassess Monday to see if there is any indication of renal recovery.  After his dialysis he went to radiation therapy.  PT OT recommending CIR so we will follow up with CIR rehab coordinator next week.  **02/20/20  Patient was asking when his biopsy results will be back and this is still pending.  Also states that his leg feels a little bit better today.  Went to radiation today.  Complaining that he is having more diarrhea today than he did yesterday and will cut back on his laxatives.  His IV fluids were stopped by nephrology today and they will not dialyze him this weekend.  His  calcium is now down to 8.9.   02/21/20 Ca2+ is now 9.1. Bx results aren't back. Renal Fxn slightly worsening. Patient complaining of some gas pains.   02/22/20 Patient's biopsy results are still pending.  His renal function slightly improved from yesterday.  Patient feels better.  Calcium remains normal.  He is going to undergo radiation treatment again later today and is scheduled for 3 PM.  Assessment & Plan:   Principal Problem:   Hypercalcemia Active Problems:   AKI (acute kidney injury) ([place])   Pathologic fracture   Hyponatremia   Leukocytosis   Kappa light chain myeloma (HCC)  Hypercalcemia withT6, T9pathological fracture and severeAKI; concern for Multiple Myeloma (Kappa) -Likely secondary to multiple myeloma but we do not have a confirmed diagnosis and he will be going for a bone marrow biopsy tomorrow. -Reviewed CT chest, abdomen/pelvis result.  -MRI thoracic spine showed "Numerous bone marrow lesions throughout all visualized levels, but worst at T5, T6 and T9. 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 The T6 lesion severely narrows the thecal sac and deforms the spinal cord. 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 Pathologic fractures of T6 and T9 with 3-5 mm of retropulsion." ;  Patient continues to have some good strength in his legs but complains of them hurting. -Neuro-surgery evaluated initially and from a neurosurgical standpoint they do not feel that he is a surgical indication and they feel the management is typically radiation.  Dr. Marcello Moores did advise that if the patient is a good candidate for kyphoplasty this could be explored. Will discuss the case again with Dr. Marcello Moores and Dr. Marcello Moores evaluated and he feels that neurosurgical intervention is not necessary at this time and recommend starting the patient on IV dexamethasone 6 mg every 6 hours and then tapering down to 4 mg every 8 hours on 02/22/2020 and  continuing the dexamethasone through the duration of radiation.  He also recommended radiation oncology be involved and patient was simulated and had his first radiation treatment today. -Patient received IV fluid and Dilaudid in the ED. -Admitted patient on the floor.  -EDP consulted neurosurgery and oncology -OncologyDr. Shadad-recommended to check UPEP and SPEP-to confirm multiple myeloma and oncology will get involved at that point. Patient has an M Spike of 0.9 Total Protein of 5.8, and Kappa/Lambda Light chains are now showing an elevation as his kappa free light chains with a value of 37.5 and a lambda free light chain of 9.9 and a kappa/lambda light chain ratio of 3.79.  Likely this is a kappa free light chain multiple myeloma and his UPEP is still pending as well as his bone marrow biopsy to confirm diagnosis but his spine lesions could be from a plasmacytoma; bone marrow biopsy still pending -C/w Antiemetic, pain control -Nephrology following- concern for myeloma and he underwent dialysis the day before yesterday given uremic symptoms and is scheduled for dialysis again today -Continue with aggressive IV fluid for hydration with isotonic fluids  -Started on calcitonin and getting 290 units sq BID x2  Doses  -Given renal function nephrology will give pamidronate 30 mg IV x1 -PTH is low consistent which rules out primary hyperparathyroid; appropriate response for level of hypercalcemia. -Lidocaine patch for rib pain -Ca2+ went from >15.0 -> 13.5 -> 14.1 -> 11.1 -> 10.9 -> 8.9 -> 9.1  -Medical Oncology consulted for further evaluation and recommends and bone marrow biopsy and this was done on Friday, 02/19/2020 -Appreciate multidisciplinary involvement of medical oncology, radiation oncology, neurosurgery, interventional radiology, nephrology and will follow up on specialist recommendations; he underwent a bone marrow biopsy yesterday and awaiting for the biopsy results Nephrology has not been  a dialysis the patient this weekend and I stopped his fluids given that his calcium is improved.  AKI, mildly improved and improved with dialysis Metabolic acidosis -Likely in the setting of underlying multiple myeloma prerenal azotemia +/- ATN -Nephrology following and feels likely in the setting of multiple myeloma -Avoid nephrotoxic medications -Patient having Uremic Symptoms so Nephrology will initiate HD and he had his first dialysis session 02/17/2020 -IV fluids for hydration (Getting NS at 150 mL/hr) now stopped -Patient's CO2 is now 20, anion gap is 9, chloride level is 103 -C/w Strict I's and O's, daily weight, close monitoring urinary output and respiratory status -Metabolic Acidosis is now resolved -Nephrology feels that TPE may be true given that he has had symptoms for over a month and oncology does not feel it is likely necessary at this point given that his protein is not that high. -Check bladder scan to make sure patient is not retaining with all aggressive IV patient -His BUN/creatinine went from 65/4.68 -> 36/3.57 -> 52/4.10 -> 33/2.69 -> 52/3.00 -> 65/2.86 -Will liberalize Diet and place on Regular Diet   Hyponatremia -Sodium of 125 on admission. -Now his sodium is now 132 -Was started on IVF but now stopepd. Per nephrology likely in the event of pseudo from paraproteinemia. -We will check urine and serum osmolality, TSH, cortisol -Serum osmolality is high likely representing the paraprotein per Nephrology recc's  -Nephrology following and appreciate assistance  -Continue to Monitor and Trend Na+   Leukocytosis -Likely reactive initially. Improving slowly and now trended up in the setting of steroid demargination but now WBC has resolved and WBC is 8.4 -Afebrile, no indication for antibiotics as there is no evidence of infection  -Continue to monitor and repeat CBC in the AM  Constipation, improving and now having diarrhea -Likely secondary to hypercalcemia  due to underlying possible multiple myeloma -Bowel regimen was adjusted today and will be continued but will be made as needed given that he is having diarrhea now -Continued IV fluids but now stopped  -Mobility as tolerated  Normocytic Anemia -Hgb/Hct went from 14.2/41.2 -> 10.9/31.6 and today his hemoglobin/hematocrit is 9.4/27.4 -? Dilutional Drop -Check Anemia Panel and showed an iron level of 38, U IBC 200, TIBC of 238, saturation ratios of 16%, ferritin level 14, folate level 11.7, vitamin B12 level 413; Continue to Monitor for S/Sx of Bleeding -Currently no overt bleeding noted -Repeat CBC in the AM  Hyperglycemia in the setting of new onset type 2 diabetes mellitus -Check HbA1c in the AM and his hemoglobin A1c was 6.5 -Patient's Blood Sugars have been ranging from 116-161 on daily BMP/CMP's; expect blood sugars to elevate in the setting of steroid demargination -Continue to monitor blood sugars carefully -Continue to Monitor closely and if necessary will place on Sensitive Novolog SSI AC and placed on sliding scale  Hypomagnesemia -Patient magnesium level is 1.8 -Continue  to monitor replete as necessary -Repeat magnesium level in a.m.  Abnormal LFTs  -Mildly elevated -patient's AST is gone from 30 -> 63 -> 44 -> 40 and ALT is gone from 26 -> 55 -> 60 -> 57 -Continue monitor trend carefully and if necessary will obtain an acute hepatitis panel as well as a right upper quadrant ultrasound next-repeat CMP in a.m.  Obesity -Estimated body mass index is 32.73 kg/m as calculated from the following:   Height as of this encounter: _0  (1.6 m).   Weight as of this encounter: 83.8 kg. -Weight Loss and Dietary Counseling given   DVT prophylaxis: Heparin 5000 units subcu every 8h Code Status: FULL CODE  Family Communication: Discussed with family present at bedside  Disposition Plan: Pending further evaluation by Medical Oncology and clearance by Nephrology  Status is:  Inpatient  Remains inpatient appropriate because:Ongoing diagnostic testing needed not appropriate for outpatient work up, Unsafe d/c plan, IV treatments appropriate due to intensity of illness or inability to take PO and Inpatient level of care appropriate due to severity of illness   Dispo: The patient is from: Home              Anticipated d/c is to: TBD              Anticipated d/c date is: 2 days              Patient currently is not medically stable to d/c.  Consultants:   Nephrology  Medical Oncology   IR  Procedures: [facility] placement with HD  Bone Marrow Bx  Antimicrobials:  Anti-infectives (From admission, onward)   Start     Dose/Rate Route Frequency Ordered Stop   02/17/20 0930  ceFAZolin (ANCEF) IVPB 2g/100 mL premix        2 g 200 mL/hr over 30 Minutes Intravenous To Radiology 02/17/20 0851 02/17/20 1034     Subjective: Seen and examined at bedside with the translator Christina #[id number].  Patient states that he is feeling well today and states that his leg is about the same.  No nausea or vomiting.  Thinks that his abdominal pain is doing better.  Wanting to go to radiation again.  Still complains of some pain around his ribs.  No other concerns or complaints at this time.  Objective: Vitals:   02/21/20 1351 02/21/20 2012 02/22/20 0430 02/22/20 1228  BP: (!) 156/86 (!) 156/87 (!) 149/81 (!) 143/76  Pulse: 70 72 72 76  Resp: _1 Temp: 97.9 F (36.6 C) 98.3 F (36.8 C) 98.2 F (36.8 C) 97.8 F (36.6 C)  TempSrc: Oral Oral Oral Oral  SpO2: 98% 98% 98% 97%  Weight:   83.8 kg   Height:        Intake/Output Summary (Last 24 hours) at 02/22/2020 1434 Last data filed at 02/22/2020 0500 Gross per 24 hour  Intake 550 ml  Output 801 ml  Net -251 ml   Filed Weights   02/19/20 1255 02/19/20 1630 02/22/20 0430  Weight: 78.6 kg 77.6 kg 83.8 kg   Examination: Physical Exam:  Constitutional: WN/WD obese Spanish-speaking male currently no acute distress  appears calm. Eyes: Lids and conjunctivae normal, sclerae anicteric  ENMT: External Ears, Nose appear normal. Grossly normal hearing.  Neck: Appears normal, supple, no cervical masses, normal ROM, no appreciable thyromegaly; no JVD Respiratory: Diminished to auscultation bilaterally, no wheezing, rales, rhonchi or crackles. Normal respiratory effort and patient is not tachypenic. No accessory muscle  use.  Unlabored breathing Cardiovascular: RRR, no murmurs / rubs / gallops. S1 and S2 auscultated. No extremity edema. Abdomen: Soft, non-tender, distended secondary body habitus. Bowel sounds positive.  GU: Deferred. Musculoskeletal: No clubbing / cyanosis of digits/nails. No joint deformity upper and lower extremities.  Skin: No rashes, lesions, ulcers on limited skin evaluation. No induration; Warm and dry.  Neurologic: CN 2-12 grossly intact with no focal deficits. Romberg sign and cerebellar reflexes not assessed.  Psychiatric: Normal judgment and insight. Alert and oriented x 3. Normal mood and appropriate affect.   Data Reviewed: I have personally reviewed following labs and imaging studies  CBC: Recent Labs  Lab 02/18/20 0250 02/19/20 0322 02/20/20 0411 02/21/20 0219 02/21/20 2131  WBC 9.1 11.5* 7.9 7.2 8.4  NEUTROABS 7.9* 10.3* 6.9 6.1 7.6  HGB 10.7* 10.7* 9.9* 9.3* 9.4*  HCT 30.8* 30.9* 28.4* 27.5* 27.4*  MCV 85.8 86.6 85.3 85.7 84.8  PLT 178 187 188 194 182   Basic Metabolic Panel: Recent Labs  Lab 02/18/20 0250 02/19/20 0322 02/20/20 0411 02/21/20 0219 02/21/20 2131  NA 135 130* 134* 135 132*  K 4.1 4.5 4.1 4.1 4.7  CL 101 99 102 105 103  CO2 26 21* 24 22 20*  GLUCOSE 102* 147* 145* 129* 161*  BUN 36* 52* 33* 52* 65*  CREATININE 3.57* 4.10* 2.69* 3.00* 2.86*  CALCIUM 11.1* 10.9* 8.9 9.1 9.1  MG 1.7 1.6* 1.9 1.7 1.8  PHOS 3.0 3.6 3.0 3.5 3.0   GFR: Estimated Creatinine Clearance: 27.6 mL/min (A) (by C-G formula based on SCr of 2.86 mg/dL (H)). Liver Function  Tests: Recent Labs  Lab 02/18/20 0250 02/19/20 0322 02/20/20 0411 02/21/20 0219 02/21/20 2131  AST 29 30 63* 44* 40  ALT 25 26 55* 60* 57*  ALKPHOS 55 61 63 64 80  BILITOT 0.5 0.6 0.4 0.6 0.7  PROT 5.4* 5.7* 5.2* 5.3* 5.1*  ALBUMIN 2.3* 2.4* 2.2* 2.2* 2.2*   No results for input(s): LIPASE, AMYLASE in the last 168 hours. No results for input(s): AMMONIA in the last 168 hours. Coagulation Profile: Recent Labs  Lab 02/18/20 1132  INR 1.1   Cardiac Enzymes: No results for input(s): CKTOTAL, CKMB, CKMBINDEX, TROPONINI in the last 168 hours. BNP (last 3 results) No results for input(s): PROBNP in the last 8760 hours. HbA1C: No results for input(s): HGBA1C in the last 72 hours. CBG: No results for input(s): GLUCAP in the last 168 hours. Lipid Profile: No results for input(s): CHOL, HDL, LDLCALC, TRIG, CHOLHDL, LDLDIRECT in the last 72 hours. Thyroid Function Tests: No results for input(s): TSH, T4TOTAL, FREET4, T3FREE, THYROIDAB in the last 72 hours. Anemia Panel: No results for input(s): VITAMINB12, FOLATE, FERRITIN, TIBC, IRON, RETICCTPCT in the last 72 hours. Sepsis Labs: No results for input(s): PROCALCITON, LATICACIDVEN in the last 168 hours.  Recent Results (from the past 240 hour(s))  SARS Coronavirus 2 by RT PCR (hospital order, performed in [facility] hospital lab) Nasopharyngeal Nasopharyngeal Swab     Status: None   Collection Time: 02/14/20  4:05 PM   Specimen: Nasopharyngeal Swab  Result Value Ref Range Status   SARS Coronavirus 2 NEGATIVE NEGATIVE Final    Comment: (NOTE) SARS-CoV-2 target nucleic acids are NOT DETECTED.  The SARS-CoV-2 RNA is generally detectable in upper and lower respiratory specimens during the acute phase of infection. The lowest concentration of SARS-CoV-2 viral copies this assay can detect is 250 copies / mL. A negative result does not preclude SARS-CoV-2 infection and should  not be used as the sole basis for treatment or  other patient management decisions.  A negative result may occur with improper specimen collection / handling, submission of specimen other than nasopharyngeal swab, presence of viral mutation(s) within the areas targeted by this assay, and inadequate number of viral copies (<250 copies / mL). A negative result must be combined with clinical observations, patient history, and epidemiological information.  Fact Sheet for Patients:   StrictlyIdeas.no  Fact Sheet for Healthcare Providers: BankingDealers.co.za  This test is not yet approved or  cleared by the [place] FDA and has been authorized for detection and/or diagnosis of SARS-CoV-2 by FDA under an Emergency Use Authorization (EUA).  This EUA will remain in effect (meaning this test can be used) for the duration of the COVID-19 declaration under Section 564(b)(1) of the Act, 21 U.S.C. section 360bbb-3(b)(1), unless the authorization is terminated or revoked sooner.  Performed at [facility], [place] [address], [place], [place] [address]     RN Pressure Injury Documentation:     Estimated body mass index is 32.73 kg/m as calculated from the following:   Height as of this encounter: _0  (1.6 m).   Weight as of this encounter: 83.8 kg.  Malnutrition Type:  Nutrition Problem: Increased nutrient needs Etiology: cancer and cancer related treatments   Malnutrition Characteristics:  Signs/Symptoms: estimated needs   Nutrition Interventions:  Interventions: Ensure Enlive (each supplement provides 350kcal and 20 grams of protein)   Radiology Studies: No results found.  Scheduled Meds: . Chlorhexidine Gluconate Cloth  6 each Topical Q0600  . dexamethasone (DECADRON) injection  4 mg Intravenous Q8H  . feeding supplement (ENSURE ENLIVE)  237 mL Oral BID BM  . heparin injection (subcutaneous)  5,000 Units Subcutaneous Q8H  . lidocaine  1 patch Transdermal Q24H  .  pantoprazole  40 mg Oral Daily  . sodium chloride flush  3 mL Intravenous Once   Continuous Infusions:   LOS: 8 days   Kerney Elbe, DO [facility] PAGER is on [place]  If 7PM-7AM, please contact night-coverage [email or web address]

## 2020-02-22 NOTE — Progress Notes (Signed)
OT Cancellation Note  Patient Details Name: Laquinton Bihm MRN: [id number] DOB: 1966-06-20   Cancelled Treatment:    Reason Eval/Treat Not Completed: Patient at procedure or test/ unavailable. Patient off floor for radiation. OT will continue efforts when patient is available.   Gloris Manchester OTR/L Supplemental OT, Department of rehab services [phone]  Amairany Schumpert R H. 02/22/2020, 2:55 PM

## 2020-02-22 NOTE — Progress Notes (Signed)
Spoke with the patient's nurse Blanch Media to let her know that he has a radiation treatment scheduled today at 3:00 pm.  I asked her to call [facility] and set up transportation to have him here at 2:45 pm.  I let her know that we would send a calendar with him with his treatment times on it so that they are aware.  She verbalized understanding.  Will continue to follow as necessary.  Gloriajean Dell. Leonie Green, BSN

## 2020-02-22 NOTE — Progress Notes (Signed)
[facility] Progress Note   54 y.o.maleHTN p/wgeneralized weakness, left rib and lower back pain + constipation for a mth aftersustaininga fall from a ladder a mth ago. Pain is worse with deep inspiration and also has noted decreased urine output as well but denies obstructive like symptoms or incontinence.Noted on on CT to have diffuse lyticlesions throughout the skeletonand pathologicT5 and T9 , left 9th and 10th ribfractures.T6 level tumor extension narrowing thecal sac.He has been using Naproxen daily for the past 15 days.  Assessment/ Plan:   1. AKI: Likely secondary to prerenal azotemia and ATN but also concerning for light chain deposition disease or myeloma kidney.  No indication for RRT at this time.  Kidney function stable and continue to monitor.   1. Oral hydration disposition  2. Status post renal replacement therapy on 8/11 and 8/13, currently has dialysis catheter  3. Continue to monitor urine output and creatinine daily  4. Follow-up UPEP  5. Bone marrow biopsy per primary team 2. Hypercalcemia - concerning for myeloma status post pamidronate, calcitonin, hydration.  Likely secondary to myeloma.  Given hypoalbuminemia calcium corrects to mild elevation.  Continue to monitor encourage hydration.    3. HTN -continue home blood pressure medications 4. Hyponatremia -mild decrease stable between 130 and 135.  5. Constipation from prerenal as well as the hypercalcemia.  Primary team managing.  XRT ongoing  Subjective:   Patient feels well but does have some chest tightness and pain as well as swelling. UOP continues to be good. Crt stable.   Objective:   BP (!) 143/76 (BP Location: Right Arm)   Pulse 76   Temp 97.8 F (36.6 C) (Oral)   Resp 18   Ht _0  (1.6 m)   Wt 83.8 kg   SpO2 97%   BMI 32.73 kg/m   Intake/Output Summary (Last 24 hours) at 02/22/2020 1533 Last data filed at 02/22/2020 1434 Gross per 24 hour  Intake 1150 ml  Output 1000  ml  Net 150 ml   Weight change:   Physical Exam: GEN: NAD, A&Ox3, NCAT HEENT:Conjunctival pallor, EOMI NECK: Supple, no thyromegaly LUNGS: CTA B/L no rales, rhonchi or wheezing CV: RRR, No M/R/G ABD: SNDNT +BS  EXT: No lower extremity edema, left leg weaker than right ACCESS: RIJ TC (8/11)  Imaging: No results found.  Labs: BMET Recent Labs  Lab 02/17/20 0102 02/17/20 1800 02/18/20 0250 02/19/20 0322 02/20/20 0411 02/21/20 0219 02/21/20 2131  NA 129* 129* 135 130* 134* 135 132*  K 4.6 4.2 4.1 4.5 4.1 4.1 4.7  CL 96* 97* 101 99 102 105 103  CO2 _1 21* 24 22 20*  GLUCOSE 123* 95 102* 147* 145* 129* 161*  BUN 67* 65* 36* 52* 33* 52* 65*  CREATININE 4.74* 4.68* 3.57* 4.10* 2.69* 3.00* 2.86*  CALCIUM 14.1* 14.0* 11.1* 10.9* 8.9 9.1 9.1  PHOS 5.5* 5.3* 3.0 3.6 3.0 3.5 3.0   CBC Recent Labs  Lab 02/19/20 0322 02/20/20 0411 02/21/20 0219 02/21/20 2131  WBC 11.5* 7.9 7.2 8.4  NEUTROABS 10.3* 6.9 6.1 7.6  HGB 10.7* 9.9* 9.3* 9.4*  HCT 30.9* 28.4* 27.5* 27.4*  MCV 86.6 85.3 85.7 84.8  PLT 187 188 194 190    Medications:    . Chlorhexidine Gluconate Cloth  6 each Topical Q0600  . dexamethasone (DECADRON) injection  4 mg Intravenous Q8H  . feeding supplement (ENSURE ENLIVE)  237 mL Oral BID BM  . heparin injection (subcutaneous)  5,000 Units Subcutaneous Q8H  .  lidocaine  1 patch Transdermal Q24H  . pantoprazole  40 mg Oral Daily  . sodium chloride flush  3 mL Intravenous Once      Reesa Chew  02/22/2020, 3:33 PM

## 2020-02-22 NOTE — Progress Notes (Signed)
Transported by [facility] to [facility].

## 2020-02-23 ENCOUNTER — Ambulatory Visit: Payer: Self-pay

## 2020-02-23 LAB — CBC WITH DIFFERENTIAL/PLATELET
Abs Immature Granulocytes: 0.24 10*3/uL — ABNORMAL HIGH (ref 0.00–0.07)
Basophils Absolute: 0 10*3/uL (ref 0.0–0.1)
Basophils Relative: 0 %
Eosinophils Absolute: 0.2 10*3/uL (ref 0.0–0.5)
Eosinophils Relative: 2 %
HCT: 30.6 % — ABNORMAL LOW (ref 39.0–52.0)
Hemoglobin: 10.6 g/dL — ABNORMAL LOW (ref 13.0–17.0)
Immature Granulocytes: 2 %
Lymphocytes Relative: 5 %
Lymphs Abs: 0.5 10*3/uL — ABNORMAL LOW (ref 0.7–4.0)
MCH: 29.8 pg (ref 26.0–34.0)
MCHC: 34.6 g/dL (ref 30.0–36.0)
MCV: 86 fL (ref 80.0–100.0)
Monocytes Absolute: 0.4 10*3/uL (ref 0.1–1.0)
Monocytes Relative: 4 %
Neutro Abs: 8.8 10*3/uL — ABNORMAL HIGH (ref 1.7–7.7)
Neutrophils Relative %: 87 %
Platelets: 213 10*3/uL (ref 150–400)
RBC: 3.56 MIL/uL — ABNORMAL LOW (ref 4.22–5.81)
RDW: 11.4 % — ABNORMAL LOW (ref 11.5–15.5)
WBC: 10.1 10*3/uL (ref 4.0–10.5)
nRBC: 0 % (ref 0.0–0.2)

## 2020-02-23 LAB — PHOSPHORUS: Phosphorus: 3.5 mg/dL (ref 2.5–4.6)

## 2020-02-23 LAB — MULTIPLE MYELOMA PANEL, SERUM
Albumin SerPl Elph-Mcnc: 2.7 g/dL — ABNORMAL LOW (ref 2.9–4.4)
Albumin/Glob SerPl: 1.1 (ref 0.7–1.7)
Alpha 1: 0.3 g/dL (ref 0.0–0.4)
Alpha2 Glob SerPl Elph-Mcnc: 0.8 g/dL (ref 0.4–1.0)
B-Globulin SerPl Elph-Mcnc: 0.7 g/dL (ref 0.7–1.3)
Gamma Glob SerPl Elph-Mcnc: 0.9 g/dL (ref 0.4–1.8)
Globulin, Total: 2.7 g/dL (ref 2.2–3.9)
IgA: 37 mg/dL — ABNORMAL LOW (ref 90–386)
IgG (Immunoglobin G), Serum: 1069 mg/dL (ref 603–1613)
IgM (Immunoglobulin M), Srm: 20 mg/dL (ref 20–172)
M Protein SerPl Elph-Mcnc: 0.7 g/dL — ABNORMAL HIGH
Total Protein ELP: 5.4 g/dL — ABNORMAL LOW (ref 6.0–8.5)

## 2020-02-23 LAB — COMPREHENSIVE METABOLIC PANEL
ALT: 58 U/L — ABNORMAL HIGH (ref 0–44)
AST: 37 U/L (ref 15–41)
Albumin: 2.2 g/dL — ABNORMAL LOW (ref 3.5–5.0)
Alkaline Phosphatase: 96 U/L (ref 38–126)
Anion gap: 8 (ref 5–15)
BUN: 63 mg/dL — ABNORMAL HIGH (ref 6–20)
CO2: 21 mmol/L — ABNORMAL LOW (ref 22–32)
Calcium: 9.5 mg/dL (ref 8.9–10.3)
Chloride: 104 mmol/L (ref 98–111)
Creatinine, Ser: 2.51 mg/dL — ABNORMAL HIGH (ref 0.61–1.24)
GFR calc Af Amer: 32 mL/min — ABNORMAL LOW (ref >60)
GFR calc non Af Amer: 28 mL/min — ABNORMAL LOW (ref >60)
Glucose, Bld: 152 mg/dL — ABNORMAL HIGH (ref 70–99)
Potassium: 4.5 mmol/L (ref 3.5–5.1)
Sodium: 133 mmol/L — ABNORMAL LOW (ref 135–145)
Total Bilirubin: 0.5 mg/dL (ref 0.3–1.2)
Total Protein: 5.5 g/dL — ABNORMAL LOW (ref 6.5–8.1)

## 2020-02-23 LAB — SURGICAL PATHOLOGY

## 2020-02-23 LAB — MAGNESIUM: Magnesium: 1.7 mg/dL (ref 1.7–2.4)

## 2020-02-23 LAB — IMMUNOGLOBULINS A/E/G/M, SERUM
IgA: 38 mg/dL — ABNORMAL LOW (ref 90–386)
IgE (Immunoglobulin E), Serum: 15 IU/mL (ref 6–495)
IgG (Immunoglobin G), Serum: 1025 mg/dL (ref 603–1613)
IgM (Immunoglobulin M), Srm: 19 mg/dL — ABNORMAL LOW (ref 20–172)

## 2020-02-23 MED ORDER — ENSURE ENLIVE PO LIQD
237.0000 mL | Freq: Three times a day (TID) | ORAL | Status: DC
  Administered 2020-02-23 – 2020-03-03 (×23): 237 mL via ORAL

## 2020-02-23 MED ORDER — LACTATED RINGERS IV SOLN: INTRAVENOUS | Status: DC

## 2020-02-23 MED ORDER — AMLODIPINE BESYLATE 5 MG PO TABS
5.0000 mg | ORAL_TABLET | Freq: Every day | ORAL | Status: DC
  Administered 2020-02-23: 5 mg via ORAL
  Filled 2020-02-23: qty 1

## 2020-02-23 NOTE — Progress Notes (Signed)
[place] [phone]   [phone]  PROGRESS NOTE  Patient Care Team: Patient, No Pcp Per as PCP - General (General Practice)  Hematological/Oncological History # Lytic Lesions Throughout Skeleton/Hypercalcemia # Pathologic Fractures of Spine/Soft Tissue Masses in Spine #Concern for Multiple Myeloma 1) 02/14/2020: presented to the emergency department with markedly worsening back pain. 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 The T6 lesion severely narrows the thecal sac and deforms the spinal cord. 3) 02/18/2020: establish care with Dr. Lorenso Courier    HISTORY OF PRESENTING ILLNESS:  Trevor Shelton 54 y.o. male with medical history significant for HTN and HLD who presented with back pain and was found to have cord compression and lytic lesions concerning for multiple myeloma.   On review of the previous records progress he initially presented to the emergency department on 02/14/2020 with markedly worsening back pain. The pain has been going on for approximately 1 month's time when he initially presented. In the emergency department had a CT chest abdomen pelvis which showed lytic lesions throughout the skeleton consistent with multiple myeloma or metastatic disease. There were also signs of compression fracture of the spine. An MRI spine was ordered which showed epidural tumor extension at T6 and T8-9. The T6 lesion severely narrowed the thecal sac and deforms the spinal cord. The patient was referred to radiation oncology. In addition to radiation oncology, medical oncology was consulted for further evaluation and management as well.  Today, the patient is sitting up in the recliner.  He is awaiting transfer to [facility] for radiation.  He reports ongoing pain to his ribs and back.  Pain seems to radiate from the back  and extends around to his chest.  He still has some pain in his left lower extremity with weakness and numbness.  He underwent a bone marrow biopsy on 02/19/2020.  The results are still pending at the time my visit.  He is not having any fevers or chills.  Denies nausea, vomiting, constipation, diarrhea.  The remainder of a 10 point review of systems is noncontributory.  MEDICAL HISTORY:  Past Medical History:  Diagnosis Date  . AKI (acute kidney injury) ([place]) 02/2020  . Hypercalcemia 02/2020  . Hypercholesteremia   . Hypertension     SURGICAL HISTORY: Past Surgical History:  Procedure Laterality Date  . HERNIA REPAIR    . IR FLUORO GUIDE CV LINE RIGHT  02/17/2020  . IR US GUIDE VASC ACCESS RIGHT  02/17/2020    SOCIAL HISTORY: Social History   Socioeconomic History  . Marital status: Married    Spouse name: Not on file  . Number of children: Not on file  . Years of education: Not on file  . Highest education level: Not on file  Occupational History  . Not on file  Tobacco Use  . Smoking status: Never Smoker  . Smokeless tobacco: Never Used  Vaping Use  . Vaping Use: Never used  Substance and Sexual Activity  . Alcohol use: Not Currently  . Drug use: Not Currently  . Sexual activity: Not Currently  Other Topics Concern  . Not on file  Social History Narrative  . Not on file   Social Determinants of Health   Financial Resource Strain:   . Difficulty of Paying Living Expenses:   Food Insecurity:   . Worried About Charity fundraiser in the  Last Year:   . [place] in the Last Year:   Transportation Needs:   . Film/video editor (Medical):   Marland Kitchen Lack of Transportation (Non-Medical):   Physical Activity:   . Days of Exercise per Week:   . Minutes of Exercise per Session:   Stress:   . Feeling of Stress :   Social Connections:   . Frequency of Communication with Friends and Family:   . Frequency of Social Gatherings with Friends and Family:   . Attends  Religious Services:   . Active Member of Clubs or Organizations:   . Attends Archivist Meetings:   Marland Kitchen Marital Status:   Intimate Partner Violence:   . Fear of Current or Ex-Partner:   . Emotionally Abused:   Marland Kitchen Physically Abused:   . Sexually Abused:     FAMILY HISTORY: History reviewed. No pertinent family history.  ALLERGIES:  has No Known Allergies.  MEDICATIONS:  Current Facility-Administered Medications  Medication Dose Route Frequency Provider Last Rate Last Admin  . acetaminophen (TYLENOL) tablet 650 mg  650 mg Oral Q6H PRN Pahwani, Rinka R, MD   650 mg at 02/23/20 1148   Or  . acetaminophen (TYLENOL) suppository 650 mg  650 mg Rectal Q6H PRN Pahwani, Rinka R, MD      . amLODipine (NORVASC) tablet 5 mg  5 mg Oral Daily Reesa Chew, MD   5 mg at 02/23/20 1048  . bisacodyl (DULCOLAX) suppository 10 mg  10 mg Rectal Daily PRN Raiford Noble Latif, DO   10 mg at 02/18/20 1954  . Chlorhexidine Gluconate Cloth 2 % PADS 6 each  6 each Topical Q0600 Dwana Melena, MD   6 each at 02/23/20 0518  . dexamethasone (DECADRON) injection 4 mg  4 mg Intravenous Q8H Sheikh, Omair Vandemere, DO   4 mg at 02/23/20 0518  . feeding supplement (ENSURE ENLIVE) (ENSURE ENLIVE) liquid 237 mL  237 mL Oral TID BM Sheikh, Omair Latif, DO      . heparin injection 5,000 Units  5,000 Units Subcutaneous Q8H Allred, Darrell K, PA-C   5,000 Units at 02/23/20 0519  . hydrALAZINE (APRESOLINE) injection 10 mg  10 mg Intravenous Q6H PRN Raiford Noble Latif, DO      . HYDROmorphone (DILAUDID) injection 0.5-1 mg  0.5-1 mg Intravenous Q2H PRN Pahwani, Rinka R, MD   1 mg at 02/23/20 0859  . lactated ringers infusion   Intravenous Continuous Reesa Chew, MD 100 mL/hr at 02/23/20 0830 New Bag at 02/23/20 0830  . lidocaine (LIDODERM) 5 % 1 patch  1 patch Transdermal Q24H Nolberto Hanlon, MD   1 patch at 02/22/20 1425  . ondansetron (ZOFRAN) tablet 4 mg  4 mg Oral Q6H PRN Pahwani, Rinka R, MD   4 mg at 02/15/20  1616   Or  . ondansetron (ZOFRAN) injection 4 mg  4 mg Intravenous Q6H PRN Pahwani, Rinka R, MD   4 mg at 02/17/20 0020  . oxyCODONE (Oxy IR/ROXICODONE) immediate release tablet 5 mg  5 mg Oral Q4H PRN Pahwani, Rinka R, MD   5 mg at 02/23/20 1148  . pantoprazole (PROTONIX) EC tablet 40 mg  40 mg Oral Daily Steenwyk, Yujing Z, RPH   40 mg at 02/23/20 1048  . polyethylene glycol (MIRALAX / GLYCOLAX) packet 17 g  17 g Oral Daily PRN Raiford Noble Latif, DO   17 g at 02/21/20 1323  . senna-docusate (Senokot-S) tablet 1 tablet  1 tablet  Oral QHS PRN Raiford Noble Latif, DO      . simethicone [facility]) chewable tablet 80 mg  80 mg Oral QID PRN Raiford Noble Latif, DO   80 mg at 02/23/20 1148  . sodium chloride flush (NS) 0.9 % injection 3 mL  3 mL Intravenous Once Virgel Manifold, MD        REVIEW OF SYSTEMS:   Constitutional: ( - ) fevers, ( - )  chills , ( - ) night sweats Eyes: ( - ) blurriness of vision, ( - ) double vision, ( - ) watery eyes Ears, nose, mouth, throat, and face: ( - ) mucositis, ( - ) sore throat Respiratory: ( - ) cough, ( - ) dyspnea, ( - ) wheezes Cardiovascular: ( - ) palpitation, ( - ) chest discomfort, ( - ) lower extremity swelling Gastrointestinal:  ( - ) nausea, ( - ) heartburn, ( - ) change in bowel habits Skin: ( - ) abnormal skin rashes Lymphatics: ( - ) new lymphadenopathy, ( - ) easy bruising Neurological: ( - ) numbness, ( - ) tingling, ( - ) new weaknesses Behavioral/Psych: ( - ) mood change, ( - ) new changes  All other systems were reviewed with the patient and are negative.  PHYSICAL EXAMINATION:  GENERAL: acutely ill appearing middle aged Hispanic male in NAD  SKIN: skin color, texture, turgor are normal, no rashes or significant lesions EYES: conjunctiva are pink and non-injected, sclera is injected LUNGS: clear to auscultation and percussion with normal breathing effort HEART: regular rate & rhythm and no murmurs and no lower extremity edema ABDOMEN:  soft, non-tender, non-distended, normal bowel sounds Musculoskeletal: no cyanosis of digits and no clubbing  PSYCH: alert & oriented x 3, fluent speech NEURO: no focal motor/sensory deficits  LABORATORY DATA:  I have reviewed the data as listed CBC Latest Ref Rng & Units 02/23/2020 02/21/2020 02/21/2020  WBC 4.0 - 10.5 K/uL 10.1 8.4 7.2  Hemoglobin 13.0 - 17.0 g/dL 10.6(L) 9.4(L) 9.3(L)  Hematocrit 39 - 52 % 30.6(L) 27.4(L) 27.5(L)  Platelets 150 - 400 K/uL 213 190 194    CMP Latest Ref Rng & Units 02/23/2020 02/21/2020 02/21/2020  Glucose 70 - 99 mg/dL 152(H) 161(H) 129(H)  BUN 6 - 20 mg/dL 63(H) 65(H) 52(H)  Creatinine 0.61 - 1.24 mg/dL 2.51(H) 2.86(H) 3.00(H)  Sodium 135 - 145 mmol/L 133(L) 132(L) 135  Potassium 3.5 - 5.1 mmol/L 4.5 4.7 4.1  Chloride 98 - 111 mmol/L 104 103 105  CO2 22 - 32 mmol/L 21(L) 20(L) 22  Calcium 8.9 - 10.3 mg/dL 9.5 9.1 9.1  Total Protein 6.5 - 8.1 g/dL 5.5(L) 5.1(L) 5.3(L)  Total Bilirubin 0.3 - 1.2 mg/dL 0.5 0.7 0.6  Alkaline Phos 38 - 126 U/L 96 80 64  AST 15 - 41 U/L 37 40 44(H)  ALT 0 - 44 U/L 58(H) 57(H) 60(H)     RADIOGRAPHIC STUDIES: I have personally reviewed the radiological images as listed and agreed with the findings in the report pronounced lytic lesions and cord compression in thoracic spine.   CT ABDOMEN PELVIS WO CONTRAST  Result Date: 02/14/2020 CLINICAL DATA:  Abdominal trauma. Trauma 1 month ago. Hypercalcemia. Fell 1 month ago. Abdominal pain, LOWER back pain, vomiting, diarrhea since the fall. EXAM: CT CHEST, ABDOMEN AND PELVIS WITHOUT CONTRAST TECHNIQUE: Multidetector CT imaging of the chest, abdomen and pelvis was performed following the standard protocol without IV contrast. COMPARISON:  None. FINDINGS: CT CHEST FINDINGS Cardiovascular: Heart is normal in size.  Minimal atherosclerotic calcification of the coronary vessels. There is focal calcification of the ascending thoracic aorta, not associated with aneurysm. Normal noncontrast  appearance of the pulmonary arteries. Mediastinum/Nodes: The visualized portion of the thyroid gland has a normal appearance. No significant mediastinal, hilar, or axillary adenopathy. Lungs/Pleura: There is subsegmental atelectasis at the lung bases. No suspicious pulmonary nodules. There is focal pleural thickening adjacent to LEFT anterior second rib lesion. Musculoskeletal: Diffuse lytic lesions are identified throughout the skeleton. Lesions vary in size from 2 millimeters to 1.8 centimeters. There is near complete replacement of the T5 vertebral body and loss of vertebral body height by approximately 30%. A soft tissue component extends to the RIGHT of the vertebral body and extends into the canal, impinging on the spinal cord, image 25 of series 3. Soft tissue mass in this region is 4.2 x 3.2 centimeters. At T9, there is near complete replacement of the vertebral body with soft tissue component to the RIGHT of the vertebral body. Loss of vertebral body height is approximately 30%. Suspect impingement of the cord at this level. Image 38 of series 3. Large lytic lesion is identified within the T5 vertebral body, measuring 1.8 centimeters. This lesion extends to the RIGHT cortex 2. Acute fracture of LEFT ribs 9 and 10, possibly pathologic. There is a soft tissue component associated with lesions in the LEFT second rib. Soft tissue component measures 1.1 x 1.8 centimeters. CT ABDOMEN PELVIS FINDINGS Hepatobiliary: No focal liver abnormality is seen. No gallstones, gallbladder wall thickening, or biliary dilatation. Pancreas: Unremarkable. No pancreatic ductal dilatation or surrounding inflammatory changes. Spleen: Normal in size without focal abnormality. Adrenals/Urinary Tract: Adrenal glands are unremarkable. Kidneys are normal, without renal calculi, focal lesion, or hydronephrosis. Bladder is unremarkable. Stomach/Bowel: Stomach and small bowel loops are unremarkable. The appendix is well seen and has a  normal appearance. Loops of colon are unremarkable. Average stool burden. Vascular/Lymphatic: No significant vascular findings are present. No enlarged abdominal or pelvic lymph nodes. Reproductive: Prostate is mildly enlarged. There are prostatic calcifications. Other: Fat containing RIGHT inguinal hernia.  No ascites. Musculoskeletal: Diffuse lytic lesions throughout the pelvis, proximal femurs, and lumbar spine. IMPRESSION: 1. Diffuse lytic lesions throughout the skeleton, consistent with multiple myeloma or metastatic disease. Multiple myeloma is favored. 2. Pathologic fractures at T5 and T9. These levels show significant soft tissue mass, and impingement upon the spinal cord. Recommend further evaluation with MRI of the thoracic spine with and without contrast. 3. Acute fractures of LEFT ribs 9 and 10, possibly pathologic. 4. No evidence for acute injury of the abdomen or pelvis. 5. Fat containing RIGHT inguinal hernia. 6. Mildly enlarged prostate. 7. Aortic Atherosclerosis (ICD10-I70.0). These results were called by telephone at the time of interpretation on 02/14/2020 at 4:06 pm to provider [facility] , who verbally acknowledged these results. Electronically Signed   By: Nolon Nations M.D.   On: 02/14/2020 16:07   DG Ribs Bilateral W/Chest  Result Date: 01/26/2020 CLINICAL DATA:  Right rib pain after fall at work. EXAM: BILATERAL RIBS AND CHEST - 4+ VIEW COMPARISON:  None. FINDINGS: No fracture or other bone lesions are seen involving the ribs. There is no evidence of pneumothorax or pleural effusion. Both lungs are clear. Heart size and mediastinal contours are within normal limits. IMPRESSION: Negative. Electronically Signed   By: Marijo Conception M.D.   On: 01/26/2020 13:20   DG Thoracic Spine 2 View  Result Date: 01/26/2020 CLINICAL DATA:  Back pain after fall at work.  EXAM: THORACIC SPINE 2 VIEWS COMPARISON:  None. FINDINGS: There is no evidence of thoracic spine fracture. Alignment is normal. No  other significant bone abnormalities are identified. IMPRESSION: Negative. Electronically Signed   By: Marijo Conception M.D.   On: 01/26/2020 13:21   DG Lumbar Spine Complete  Result Date: 01/26/2020 CLINICAL DATA:  Fall, back pain. Additional provided: Fall 3 weeks ago, sharp pain in posterior back on right side. EXAM: LUMBAR SPINE - COMPLETE 4+ VIEW COMPARISON:  No pertinent prior studies available for comparison. FINDINGS: For the purposes of this dictation, five lumbar vertebrae are assumed and the caudal most well-formed intervertebral disc is designated L5-S1. Rudimentary ribs at the T12 level. Mild L1-L2, L2-L3, L3-L4, L4-L5 grade 1 retrolisthesis. No lumbar compression deformity is demonstrated. Intervertebral disc height is maintained. Mild facet arthrosis greatest at L5-S1. Suspected fusion across the sacroiliac joints bilaterally IMPRESSION: No lumbar compression deformity is demonstrated. Mild L1-L2, L2-L3, L3-L4, L4-L5 grade 1 retrolisthesis. Mild lumbar spondylosis as outlined. Suspected fusion across the sacroiliac joints bilaterally. Electronically Signed   By: Kellie Simmering DO   On: 01/26/2020 10:15   CT Chest Wo Contrast  Result Date: 02/14/2020 CLINICAL DATA:  Abdominal trauma. Trauma 1 month ago. Hypercalcemia. Fell 1 month ago. Abdominal pain, LOWER back pain, vomiting, diarrhea since the fall. EXAM: CT CHEST, ABDOMEN AND PELVIS WITHOUT CONTRAST TECHNIQUE: Multidetector CT imaging of the chest, abdomen and pelvis was performed following the standard protocol without IV contrast. COMPARISON:  None. FINDINGS: CT CHEST FINDINGS Cardiovascular: Heart is normal in size. Minimal atherosclerotic calcification of the coronary vessels. There is focal calcification of the ascending thoracic aorta, not associated with aneurysm. Normal noncontrast appearance of the pulmonary arteries. Mediastinum/Nodes: The visualized portion of the thyroid gland has a normal appearance. No significant mediastinal,  hilar, or axillary adenopathy. Lungs/Pleura: There is subsegmental atelectasis at the lung bases. No suspicious pulmonary nodules. There is focal pleural thickening adjacent to LEFT anterior second rib lesion. Musculoskeletal: Diffuse lytic lesions are identified throughout the skeleton. Lesions vary in size from 2 millimeters to 1.8 centimeters. There is near complete replacement of the T5 vertebral body and loss of vertebral body height by approximately 30%. A soft tissue component extends to the RIGHT of the vertebral body and extends into the canal, impinging on the spinal cord, image 25 of series 3. Soft tissue mass in this region is 4.2 x 3.2 centimeters. At T9, there is near complete replacement of the vertebral body with soft tissue component to the RIGHT of the vertebral body. Loss of vertebral body height is approximately 30%. Suspect impingement of the cord at this level. Image 38 of series 3. Large lytic lesion is identified within the T5 vertebral body, measuring 1.8 centimeters. This lesion extends to the RIGHT cortex 2. Acute fracture of LEFT ribs 9 and 10, possibly pathologic. There is a soft tissue component associated with lesions in the LEFT second rib. Soft tissue component measures 1.1 x 1.8 centimeters. CT ABDOMEN PELVIS FINDINGS Hepatobiliary: No focal liver abnormality is seen. No gallstones, gallbladder wall thickening, or biliary dilatation. Pancreas: Unremarkable. No pancreatic ductal dilatation or surrounding inflammatory changes. Spleen: Normal in size without focal abnormality. Adrenals/Urinary Tract: Adrenal glands are unremarkable. Kidneys are normal, without renal calculi, focal lesion, or hydronephrosis. Bladder is unremarkable. Stomach/Bowel: Stomach and small bowel loops are unremarkable. The appendix is well seen and has a normal appearance. Loops of colon are unremarkable. Average stool burden. Vascular/Lymphatic: No significant vascular findings are present. No enlarged  abdominal or pelvic lymph nodes. Reproductive: Prostate is mildly enlarged. There are prostatic calcifications. Other: Fat containing RIGHT inguinal hernia.  No ascites. Musculoskeletal: Diffuse lytic lesions throughout the pelvis, proximal femurs, and lumbar spine. IMPRESSION: 1. Diffuse lytic lesions throughout the skeleton, consistent with multiple myeloma or metastatic disease. Multiple myeloma is favored. 2. Pathologic fractures at T5 and T9. These levels show significant soft tissue mass, and impingement upon the spinal cord. Recommend further evaluation with MRI of the thoracic spine with and without contrast. 3. Acute fractures of LEFT ribs 9 and 10, possibly pathologic. 4. No evidence for acute injury of the abdomen or pelvis. 5. Fat containing RIGHT inguinal hernia. 6. Mildly enlarged prostate. 7. Aortic Atherosclerosis (ICD10-I70.0). These results were called by telephone at the time of interpretation on 02/14/2020 at 4:06 pm to provider [facility] , who verbally acknowledged these results. Electronically Signed   By: Nolon Nations M.D.   On: 02/14/2020 16:07   MR THORACIC SPINE W WO CONTRAST  Result Date: 02/14/2020 CLINICAL DATA:  Back pain. Compression fracture. Fever with nausea and vomiting. EXAM: MRI THORACIC WITHOUT AND WITH CONTRAST TECHNIQUE: Multiplanar and multiecho pulse sequences of the thoracic spine were obtained without and with intravenous contrast. CONTRAST:  61m GADAVIST GADOBUTROL 1 MMOL/ML IV SOLN COMPARISON:  None. FINDINGS: MRI THORACIC SPINE FINDINGS Alignment:  Normal Vertebrae: There are numerous bone marrow lesions throughout all visualized levels, but worst at T5, T6 and T9. There is compression deformity of T6 with 50% height loss and 5 mm of retropulsion. There is compression deformity of T9 with approximately 10% height loss and 3 mm of retropulsion. Cord: There is mild hyperintense T2-weighted signal in the spinal cord at T5. Paraspinal and other soft tissues: There is  soft tissue mass is associated with the vertebral lesions at T6 and T9. Otherwise, the paraspinal soft tissues are unremarkable. Disc levels: T5-6: Contrast-enhancing mass replaces most of the vertebral body bone marrow and extends into the pedicles, transverse processes and the right paraspinal tissues. There is also epidural extension that severely narrows the thecal sac and causes mass effect on the spinal cord. The mass also severely narrows the right T5 neural foramen. T8-9: Soft tissue mass replacing much of the bone marrow at T9 with epidural extension mildly narrowing the thecal sac with slight mass effect on the spinal cord. The mass extends into the right neural foramen. IMPRESSION: 1. Numerous bone marrow lesions throughout all visualized levels, but worst at T5, T6 and T9. 2. 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 The T6 lesion severely narrows the thecal sac and deforms the spinal cord. 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 3. Pathologic fractures of T6 and T9 with 3-5 mm of retropulsion. Electronically Signed   By: KUlyses JarredM.D.   On: 02/14/2020 19:14   IR Fluoro Guide CV Line Right  Result Date: 02/17/2020 CLINICAL DATA:  Renal failure and need for tunneled hemodialysis catheter to begin hemodialysis. EXAM: TUNNELED CENTRAL VENOUS HEMODIALYSIS CATHETER PLACEMENT WITH ULTRASOUND AND FLUOROSCOPIC GUIDANCE ANESTHESIA/SEDATION: 1.0 mg IV Versed; 50 mcg IV Fentanyl. Total Moderate Sedation Time:   23 minutes. The patient's level of consciousness and physiologic status were continuously monitored during the procedure by Radiology nursing. MEDICATIONS: 2 g IV Ancef. FLUOROSCOPY TIME:  48 seconds. 4.0 mGy. PROCEDURE: The procedure, risks, benefits, and alternatives were explained to the patient. Questions regarding the procedure were encouraged and answered. The patient understands and consents  to the procedure. A timeout was  performed prior to initiating the procedure. The right neck and chest were prepped with chlorhexidine in a sterile fashion, and a sterile drape was applied covering the operative field. Maximum barrier sterile technique with sterile gowns and gloves were used for the procedure. Local anesthesia was provided with 1% lidocaine. Ultrasound was used to confirm patency of the right internal jugular vein. After creating a small venotomy incision, a 21 gauge needle was advanced into the right internal jugular vein under direct, real-time ultrasound guidance. Ultrasound image documentation was performed. After securing guidewire access, an 8 Fr dilator was placed. A J-wire was kinked to measure appropriate catheter length. A Palindrome tunneled hemodialysis catheter measuring 19 cm from tip to cuff was chosen for placement. This was tunneled in a retrograde fashion from the chest wall to the venotomy incision. At the venotomy, serial dilatation was performed and a 15 Fr peel-away sheath was placed over a guidewire. The catheter was then placed through the sheath and the sheath removed. Final catheter positioning was confirmed and documented with a fluoroscopic spot image. The catheter was aspirated, flushed with saline, and injected with appropriate volume heparin dwells. The venotomy incision was closed with subcuticular 4-0 Vicryl. Dermabond was applied to the incision. The catheter exit site was secured with 0-Prolene retention sutures. COMPLICATIONS: None.  No pneumothorax. FINDINGS: After catheter placement, the tip lies in the right atrium. The catheter aspirates normally and is ready for immediate use. IMPRESSION: Placement of tunneled hemodialysis catheter via the right internal jugular vein. The catheter tip lies in the right atrium. The catheter is ready for immediate use. Electronically Signed   By: Aletta Edouard M.D.   On: 02/17/2020 17:22   IR US Guide Vasc Access Right  Result Date: 02/17/2020 CLINICAL  DATA:  Renal failure and need for tunneled hemodialysis catheter to begin hemodialysis. EXAM: TUNNELED CENTRAL VENOUS HEMODIALYSIS CATHETER PLACEMENT WITH ULTRASOUND AND FLUOROSCOPIC GUIDANCE ANESTHESIA/SEDATION: 1.0 mg IV Versed; 50 mcg IV Fentanyl. Total Moderate Sedation Time:   23 minutes. The patient's level of consciousness and physiologic status were continuously monitored during the procedure by Radiology nursing. MEDICATIONS: 2 g IV Ancef. FLUOROSCOPY TIME:  48 seconds. 4.0 mGy. PROCEDURE: The procedure, risks, benefits, and alternatives were explained to the patient. Questions regarding the procedure were encouraged and answered. The patient understands and consents to the procedure. A timeout was performed prior to initiating the procedure. The right neck and chest were prepped with chlorhexidine in a sterile fashion, and a sterile drape was applied covering the operative field. Maximum barrier sterile technique with sterile gowns and gloves were used for the procedure. Local anesthesia was provided with 1% lidocaine. Ultrasound was used to confirm patency of the right internal jugular vein. After creating a small venotomy incision, a 21 gauge needle was advanced into the right internal jugular vein under direct, real-time ultrasound guidance. Ultrasound image documentation was performed. After securing guidewire access, an 8 Fr dilator was placed. A J-wire was kinked to measure appropriate catheter length. A Palindrome tunneled hemodialysis catheter measuring 19 cm from tip to cuff was chosen for placement. This was tunneled in a retrograde fashion from the chest wall to the venotomy incision. At the venotomy, serial dilatation was performed and a 15 Fr peel-away sheath was placed over a guidewire. The catheter was then placed through the sheath and the sheath removed. Final catheter positioning was confirmed and documented with a fluoroscopic spot image. The catheter was aspirated, flushed with saline,  and injected with appropriate volume heparin dwells. The venotomy incision was closed with subcuticular 4-0 Vicryl. Dermabond was applied to the incision. The catheter exit site was secured with 0-Prolene retention sutures. COMPLICATIONS: None.  No pneumothorax. FINDINGS: After catheter placement, the tip lies in the right atrium. The catheter aspirates normally and is ready for immediate use. IMPRESSION: Placement of tunneled hemodialysis catheter via the right internal jugular vein. The catheter tip lies in the right atrium. The catheter is ready for immediate use. Electronically Signed   By: Aletta Edouard M.D.   On: 02/17/2020 17:22   CT BONE MARROW BIOPSY & ASPIRATION  Result Date: 02/19/2020 CLINICAL DATA:  Clinical suspicion multiple myeloma and need for bone marrow biopsy. EXAM: CT GUIDED BONE MARROW ASPIRATION AND BIOPSY ANESTHESIA/SEDATION: Versed 1.0 mg IV, Fentanyl 50 mcg IV Total Moderate Sedation Time:   14 minutes. The patient's level of consciousness and physiologic status were continuously monitored during the procedure by Radiology nursing. PROCEDURE: The procedure risks, benefits, and alternatives were explained to the patient. Questions regarding the procedure were encouraged and answered. The patient understands and consents to the procedure. A time out was performed prior to initiating the procedure. The right gluteal region was prepped with chlorhexidine. Sterile gown and sterile gloves were used for the procedure. Local anesthesia was provided with 1% Lidocaine. Under CT guidance, an 11 gauge On Control bone cutting needle was advanced from a posterior approach into the right iliac bone. Needle positioning was confirmed with CT. Initial non heparinized and heparinized aspirate samples were obtained of bone marrow. Core biopsy was performed via the On Control drill needle. COMPLICATIONS: None FINDINGS: Inspection of initial aspirate did reveal visible particles. Intact core biopsy sample  was obtained. IMPRESSION: CT guided bone marrow biopsy of right posterior iliac bone with both aspirate and core samples obtained. Electronically Signed   By: Aletta Edouard M.D.   On: 02/19/2020 12:21    ASSESSMENT & PLAN Trevor Shelton 54 y.o. male with medical history significant for HTN and HLD who presented with back pain and was found to have cord compression and lytic lesions concerning for multiple myeloma.  At this time the findings on this patient's imaging and blood work are most concerning for multiple myeloma.  Interestingly the M spike and the serum free light chain ratio is relatively modest compared to the extent of the damage seen on CT scan.  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  In the event the patient is confirmed to have multiple myeloma I would recommend we proceed with CyBorD chemotherapy which can be administered in the outpatient setting once he is stabilized.  In the event it looks like the patient is not going to have a timely discharge we could consider administration of cycle 1 while he is in house.  SPEP 02/15/2020: M protein 0.9 SFLC: kappa 37.5, Lambda 9.9, ratio 3.79 UPEP: 02/17/2020: M spike not observed.  Immunofixation showed IgG monoclonal protein with kappa light chain specificity   # Lytic Lesions Througout Skeleton/Hypercalcemia # Pathologic Fractures of Spine/Soft Tissue Masses in Spine #Concern for Multiple Myeloma --bone marrow biopsy performed 02/19/2020 to confirm diagnosis; results pending at time of visit --M protein and Kappa ratio are modestly elevated compared the  the extent of damage seen on CT/MRI. 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  We will continue to closely monitor and consider additional bisphosphonate therapy as an outpatient. --once diagnosis is confirmed I would recommend we proceed with CyBorD chemotherapy ( in the setting of his poor kidney function would avoid revlimid at this time). This can be administered in the outpatient setting once he was stabilized from a cord compression/hypercalcemia standpoint, however if it appears as though the patient is going to have an extended inpatient admission we can arrange for the first cycle to be administered in house. --Hematology will continue to follow while he is inpatient.   Mikey Bussing, DNP, AGPCNP-BC, AOCNP Mon/Tues/Thurs/Fri 7am-5pm; Off Wednesdays Cell: [phone]  02/23/2020 3:27 PM

## 2020-02-23 NOTE — Progress Notes (Signed)
PROGRESS NOTE    Trevor Shelton  ZOX:[id number] DOB: 08-23-1965 DOA: 02/14/2020 PCP: Patient, No Pcp Per   Brief Narrative:  The patient is a 54 year old Spanish-speaking male with no real past medical history who presented to the ED with generalized weakness, left rib pain, lower back pain and constipation for a month.  Patient son was at bedside during admission and told most of the story.  Patient fell off a ladder from a height of about 4 feet about a month ago and landed on his left side and since then he started having back pain, left rib pain and weakness in bilateral extremities which is subsequently getting worse.  He presented to the ED and had a work-up and was found to have an elevated WBC of 13.3.  Is also noted to have sodium 125, severe AKI, calcium more than 15,.  CT of the abdomen pelvis showed diffuse lytic lesions throughout the skeleton consistent with multiple myeloma or metastatic disease.  He also was noted to have pathological fractures at T5 and T9, and acute fracture left rib 9 and 10, the EDP consulted neurosurgery and oncology.  Oncology was recommended to check SPEP and UPEP and tried hospitalist will admit this patient for hypercalcemia and AKI with suspected multiple myeloma.  Neurosurgery spoke with the EDP and then felt from a neurosurgical standpoint in the setting of multiple myeloma, there are few surgical indications and management was typically radiation.  They felt that they could explore the possibility of kyphoplasty.  At the time of admission medical oncology was consulted and they felt that patient's diagnosis of multiple myeloma was not confirmed and they felt that he did not need to be transferred to [facility].  Medical oncology recommended SPEP and UPEP to confirm diagnosis of multiple myeloma.  Patient does have an M Spike and Medical Oncology was consulted for further evaluation and recommendations.  Neurosurgery recommended the patient get treated with IV  dexamethasone and recommended radiation oncology evaluated.  Radiation oncology was consulted and patient went for simulation and underwent his first radiation treatment today.  He is pending a bone marrow biopsy and this will be done tomorrow and then patient will be dialyzed again later.  Biopsy will be done to help confirm the diagnosis of multiple myeloma as SPEP showed M protein of 0.9, and serum fixation light chains and UPEP is still pending.  Medical oncology agrees with radiation to the spine given the tumor extensions at T6 and T9 has a bone marrow biopsy cannot confirm diagnosis recommend biopsy of the soft tissue spinal mass.  Patient was started on bisphosphonate therapy and is continue to be hydrated.  Neurosurgery is recommending reducing dexamethasone 4 mg every 8 hours on 02/22/2020 and continue dexamethasone through the conclusion of his radiation and can taper.  Patient's renal function is slightly improved from yesterday during his hemodialysis session.  If no light chains in the urine then myeloma kidney is less likely per nephrology.  We will continue to hydrate per nephrology recommendations  Patient is Kappa free light chain was elevated but we still do not have a definite diagnosis of myeloma and he underwent a biopsy today.  After his biopsy he underwent dialysis again today and they will give him a break and reassess Monday to see if there is any indication of renal recovery.  After his dialysis he went to radiation therapy.  PT OT recommending CIR so we will follow up with CIR rehab coordinator next week.  **02/20/20  Patient was asking when his biopsy results will be back and this is still pending.  Also states that his leg feels a little bit better today.  Went to radiation today.  Complaining that he is having more diarrhea today than he did yesterday and will cut back on his laxatives.  His IV fluids were stopped by nephrology today and they will not dialyze him this weekend.  His  calcium is now down to 8.9.   02/21/20 Ca2+ is now 9.1. Bx results aren't back. Renal Fxn slightly worsening. Patient complaining of some gas pains.   02/22/20 Patient's biopsy results are still pending.  His renal function slightly improved from yesterday.  Patient feels better.  Calcium remains normal.  He is going to undergo radiation treatment again later today and is scheduled for 3 PM.  02/23/20 Bx Results still pending. Renal Fxn is improving and Nephrology started the patient on LR at 100 mL/hr x2 days. 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 Continues to get Radiation and will be going today   Assessment & Plan:   Principal Problem:   Hypercalcemia Active Problems:   AKI (acute kidney injury) ([place])   Pathologic fracture   Hyponatremia   Leukocytosis   Kappa light chain myeloma (HCC)  Hypercalcemia withT6, T9pathological fracture and severeAKI; concern for Multiple Myeloma (Kappa) -Likely secondary to multiple myeloma but we do not have a confirmed diagnosis and he will be going for a bone marrow biopsy tomorrow. -Reviewed CT chest, abdomen/pelvis result.  -MRI thoracic spine showed "Numerous bone marrow lesions throughout all visualized levels, but worst at T5, T6 and T9. 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 The T6 lesion severely narrows the thecal sac and deforms the spinal cord. 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 Pathologic fractures of T6 and T9 with 3-5 mm of retropulsion." ;  Patient continues to have some good strength in his legs but complains of them hurting. -Neuro-surgery evaluated initially and from a neurosurgical standpoint they do not feel that he is a surgical indication and they feel the management is typically radiation.  Dr. Marcello Moores did advise that if the patient is a good candidate for kyphoplasty this could be explored. Will discuss the case again with Dr. Marcello Moores  and Dr. Marcello Moores evaluated and he feels that neurosurgical intervention is not necessary at this time and recommend starting the patient on IV dexamethasone 6 mg every 6 hours and then tapering down to 4 mg every 8 hours on 02/22/2020 and continuing the dexamethasone through the duration of radiation.  He also recommended radiation oncology be involved and patient was simulated and had his first radiation treatment today. -Patient received IV fluid and Dilaudid in the ED;  -Admitted patient on the floor.  -EDP consulted neurosurgery and oncology -OncologyDr. Shadad-recommended to check UPEP and SPEP-to confirm multiple myeloma and oncology will get involved at that point. Patient has an M Spike of 0.9 Total Protein of 5.8, and Kappa/Lambda Light chains are now showing an elevation as his kappa free light chains with a value of 37.5 and a lambda free light chain of 9.9 and a kappa/lambda light chain ratio of 3.79.  Likely this is a kappa free light chain multiple myeloma and his UPEP is still pending as well as his bone marrow biopsy to confirm diagnosis but his spine lesions could be from a plasmacytoma; bone marrow biopsy still pending -C/w Antiemetic, pain control -Nephrology  following- concern for myeloma and he underwent dialysis x2 -Continued with aggressive IV fluid for hydration with isotonic fluids but had stopped. Nephrology restarted IVF with LR at 100 mL/hr x2 days  -Started on calcitonin and getting 290 units sq BID x2 Doses  -Given renal function nephrology will give pamidronate 30 mg IV x1 -PTH is low consistent which rules out primary hyperparathyroid; appropriate response for level of hypercalcemia. -Lidocaine patch for rib pain -Ca2+ went from >15.0 -> 13.5 -> 14.1 -> 11.1 -> 10.9 -> 8.9 -> 9.1 (x2) -> 9.5 -Medical Oncology consulted for further evaluation and recommends and bone marrow biopsy and this was done on Friday, 02/19/2020 -Appreciate multidisciplinary involvement of medical  oncology, radiation oncology, neurosurgery, interventional radiology, nephrology and will follow up on specialist recommendations; he underwent a bone marrow biopsy Friday and awaiting for the biopsy results -Nephrology had stopped fluids but resumed them again today -If the diagnosis is confirmed with multiple myeloma medical oncology recommending proceeding with chemotherapy CyBorD  AKI, mildly improved and improved with dialysis Metabolic Acidosis -Likely in the setting of underlying multiple myeloma prerenal azotemia +/- ATN -Nephrology following and feels likely in the setting of multiple myeloma and light chain deposition disease  -Avoid nephrotoxic medications -Patient having Uremic Symptoms so Nephrology will initiate HD and he had his first dialysis session 02/17/2020 -IV fluids for hydration (NS at 150 mL/hr) now stopped; Nephrology resuming LR at 100 mL/hr -Patient's CO2 is now 21, anion gap is 8, chloride level is 104 -C/w Strict I's and O's, daily weight, close monitoring urinary output and respiratory status -Metabolic Acidosis is now resolved -Nephrology feels that TPE may be true given that he has had symptoms for over a month and oncology does not feel it is likely necessary at this point given that his protein is not that high. -Check bladder scan to make sure patient is not retaining with all aggressive IV patient -His BUN/creatinine went from 65/4.68 -> 36/3.57 -> 52/4.10 -> 33/2.69 -> 52/3.00 -> 65/2.86 -> 63/2.51 -Will liberalize Diet and place on Regular Diet   Hyponatremia -Sodium of 125 on admission. -Now his sodium is now 133 -Was started on IVF but now stopepd. Per nephrology likely in the event of pseudo from paraproteinemia. -We will check urine and serum osmolality, TSH, cortisol -Serum osmolality is high likely representing the paraprotein per Nephrology recc's  -Nephrology following and appreciate assistance  -Continue to Monitor and Trend Na+    Leukocytosis -Likely reactive initially. Improving slowly and now trended up in the setting of steroid demargination but now WBC has resolved and WBC is 10.1 -Afebrile, no indication for antibiotics as there is no evidence of infection  -Continue to monitor and repeat CBC in the AM  Constipation, improving and now having diarrhea -Likely secondary to hypercalcemia due to underlying possible multiple myeloma -Bowel regimen was adjusted today and will be continued but will be made as needed given that he is having diarrhea now -Continued IV fluids but now stopped  -Mobility as tolerated  Normocytic Anemia -Hgb/Hct is now 10.6/30.6 -? Dilutional Drop -Check Anemia Panel and showed an iron level of 38, U IBC 200, TIBC of 238, saturation ratios of 16%, ferritin level 14, folate level 11.7, vitamin B12 level 413; Continue to Monitor for S/Sx of Bleeding -Currently no overt bleeding noted -Repeat CBC in the AM  Hyperglycemia in the setting of new onset type 2 diabetes mellitus -Check HbA1c in the AM and his hemoglobin A1c was 6.5 -Patient's Blood  Sugars have been ranging from 116-161 on daily BMP/CMP's; expect blood sugars to elevate in the setting of steroid demargination -Continue to monitor blood sugars carefully -Continue to Monitor closely and if necessary will place on Sensitive Novolog SSI AC and placed on sliding scale  Hypomagnesemia -Patient magnesium level is 1.7 -Continue to monitor replete as necessary -Repeat magnesium level in a.m.  Abnormal LFTs  -Mildly elevated -patient's AST is gone from 30 -> 63 -> 44 -> 40 -> 37 and ALT is gone from 26 -> 55 -> 60 -> 57 -> 58 -Continue monitor trend carefully and if necessary will obtain an acute hepatitis panel as well as a right upper quadrant ultrasound next-repeat CMP in a.m.  Obesity -Estimated body mass index is 32.73 kg/m as calculated from the following:   Height as of this encounter: _0  (1.6 m).   Weight as of  this encounter: 83.8 kg. -Weight Loss and Dietary Counseling given  -Nutritionist consulted for further evaluation recommendations and recommending continue multivitamin with minerals daily, and increasing Ensure alive p.o. to 3 times daily  DVT prophylaxis: Heparin 5000 units subcu every 8h Code Status: FULL CODE  Family Communication: Discussed with family present at bedside  Disposition Plan: Pending further evaluation by Medical Oncology and clearance by Nephrology; PT OT recommending CIR  Status is: Inpatient  Remains inpatient appropriate because:Ongoing diagnostic testing needed not appropriate for outpatient work up, Unsafe d/c plan, IV treatments appropriate due to intensity of illness or inability to take PO and Inpatient level of care appropriate due to severity of illness   Dispo: The patient is from: Home              Anticipated d/c is to: TBD              Anticipated d/c date is: 2 days              Patient currently is not medically stable to d/c.  Consultants:   Nephrology  Medical Oncology   IR  Procedures: [facility] placement with HD  Bone Marrow Bx  Antimicrobials:  Anti-infectives (From admission, onward)   Start     Dose/Rate Route Frequency Ordered Stop   02/17/20 0930  ceFAZolin (ANCEF) IVPB 2g/100 mL premix        2 g 200 mL/hr over 30 Minutes Intravenous To Radiology 02/17/20 0851 02/17/20 1034     Subjective: Seen and examined at bedside with the translator Beverlee Nims [phone].  Patient states that he is feeling about the same. No nausea or vomiting. Still complaining about pain in his ribs.  Wanting to know when his legs will get better.  No other concerns or plans at this time.  Objective: Vitals:   02/22/20 1228 02/22/20 2113 02/23/20 0540 02/23/20 1426  BP: (!) 143/76 (!) 177/95 (!) 167/91 (!) 148/82  Pulse: 76 73 73 83  Resp: _1 Temp: 97.8 F (36.6 C) 98 F (36.7 C) 97.6 F (36.4 C) 97.8 F (36.6 C)  TempSrc: Oral Oral Oral Oral   SpO2: 97% 98% 98% 98%  Weight:      Height:        Intake/Output Summary (Last 24 hours) at 02/23/2020 1720 Last data filed at 02/23/2020 1300 Gross per 24 hour  Intake 600 ml  Output 1450 ml  Net -850 ml   Filed Weights   02/19/20 1255 02/19/20 1630 02/22/20 0430  Weight: 78.6 kg 77.6 kg 83.8 kg   Examination: Physical Exam:  Constitutional: WN/WD obese Spanish-speaking male currently no acute distress appears calm. Eyes: Lids and conjunctivae normal, sclerae anicteric  ENMT: External Ears, Nose appear normal. Grossly normal hearing.  Neck: Appears normal, supple, no cervical masses, normal ROM, no appreciable thyromegaly; no JVD Respiratory: Diminished to auscultation bilaterally, no wheezing, rales, rhonchi or crackles. Normal respiratory effort and patient is not tachypenic. No accessory muscle use.  Unlabored breathing Cardiovascular: RRR, no murmurs / rubs / gallops. S1 and S2 auscultated. No extremity edema.  Abdomen: Soft, non-tender, distended secondary to body habitus. Bowel sounds positive.  GU: Deferred. Musculoskeletal: No clubbing / cyanosis of digits/nails. No joint deformity upper and lower extremities.  Skin: No rashes, lesions, ulcers on limited skin evaluation. No induration; Warm and dry.  Neurologic: CN 2-12 grossly intact with no focal deficits.  Has a weaker left leg compared to his right.  Romberg sign and cerebellar reflexes not assessed.  Psychiatric: Normal judgment and insight. Alert and oriented x 3. Normal mood and appropriate affect.   Data Reviewed: I have personally reviewed following labs and imaging studies  CBC: Recent Labs  Lab 02/19/20 0322 02/20/20 0411 02/21/20 0219 02/21/20 2131 02/23/20 0306  WBC 11.5* 7.9 7.2 8.4 10.1  NEUTROABS 10.3* 6.9 6.1 7.6 8.8*  HGB 10.7* 9.9* 9.3* 9.4* 10.6*  HCT 30.9* 28.4* 27.5* 27.4* 30.6*  MCV 86.6 85.3 85.7 84.8 86.0  PLT 187 188 194 190 035   Basic Metabolic Panel: Recent Labs  Lab  02/19/20 0322 02/20/20 0411 02/21/20 0219 02/21/20 2131 02/23/20 0306  NA 130* 134* 135 132* 133*  K 4.5 4.1 4.1 4.7 4.5  CL 99 102 105 103 104  CO2 21* 24 22 20* 21*  GLUCOSE 147* 145* 129* 161* 152*  BUN 52* 33* 52* 65* 63*  CREATININE 4.10* 2.69* 3.00* 2.86* 2.51*  CALCIUM 10.9* 8.9 9.1 9.1 9.5  MG 1.6* 1.9 1.7 1.8 1.7  PHOS 3.6 3.0 3.5 3.0 3.5   GFR: Estimated Creatinine Clearance: 31.5 mL/min (A) (by C-G formula based on SCr of 2.51 mg/dL (H)). Liver Function Tests: Recent Labs  Lab 02/19/20 0322 02/20/20 0411 02/21/20 0219 02/21/20 2131 02/23/20 0306  AST 30 63* 44* 40 37  ALT 26 55* 60* 57* 58*  ALKPHOS 61 63 64 80 96  BILITOT 0.6 0.4 0.6 0.7 0.5  PROT 5.7* 5.2* 5.3* 5.1* 5.5*  ALBUMIN 2.4* 2.2* 2.2* 2.2* 2.2*   No results for input(s): LIPASE, AMYLASE in the last 168 hours. No results for input(s): AMMONIA in the last 168 hours. Coagulation Profile: Recent Labs  Lab 02/18/20 1132  INR 1.1   Cardiac Enzymes: No results for input(s): CKTOTAL, CKMB, CKMBINDEX, TROPONINI in the last 168 hours. BNP (last 3 results) No results for input(s): PROBNP in the last 8760 hours. HbA1C: No results for input(s): HGBA1C in the last 72 hours. CBG: No results for input(s): GLUCAP in the last 168 hours. Lipid Profile: No results for input(s): CHOL, HDL, LDLCALC, TRIG, CHOLHDL, LDLDIRECT in the last 72 hours. Thyroid Function Tests: No results for input(s): TSH, T4TOTAL, FREET4, T3FREE, THYROIDAB in the last 72 hours. Anemia Panel: No results for input(s): VITAMINB12, FOLATE, FERRITIN, TIBC, IRON, RETICCTPCT in the last 72 hours. Sepsis Labs: No results for input(s): PROCALCITON, LATICACIDVEN in the last 168 hours.  Recent Results (from the past 240 hour(s))  SARS Coronavirus 2 by RT PCR (hospital order, performed in [facility] hospital lab) Nasopharyngeal Nasopharyngeal Swab     Status: None   Collection Time: 02/14/20  4:05 PM   Specimen: Nasopharyngeal Swab   Result Value Ref Range Status   SARS Coronavirus 2 NEGATIVE NEGATIVE Final    Comment: (NOTE) SARS-CoV-2 target nucleic acids are NOT DETECTED.  The SARS-CoV-2 RNA is generally detectable in upper and lower respiratory specimens during the acute phase of infection. The lowest concentration of SARS-CoV-2 viral copies this assay can detect is 250 copies / mL. A negative result does not preclude SARS-CoV-2 infection and should not be used as the sole basis for treatment or other patient management decisions.  A negative result may occur with improper specimen collection / handling, submission of specimen other than nasopharyngeal swab, presence of viral mutation(s) within the areas targeted by this assay, and inadequate number of viral copies (<250 copies / mL). A negative result must be combined with clinical observations, patient history, and epidemiological information.  Fact Sheet for Patients:   StrictlyIdeas.no  Fact Sheet for Healthcare Providers: BankingDealers.co.za  This test is not yet approved or  cleared by the [place] FDA and has been authorized for detection and/or diagnosis of SARS-CoV-2 by FDA under an Emergency Use Authorization (EUA).  This EUA will remain in effect (meaning this test can be used) for the duration of the COVID-19 declaration under Section 564(b)(1) of the Act, 21 U.S.C. section 360bbb-3(b)(1), unless the authorization is terminated or revoked sooner.  Performed at [facility], [place] [address], [place], [place] [address]     RN Pressure Injury Documentation:     Estimated body mass index is 32.73 kg/m as calculated from the following:   Height as of this encounter: _0  (1.6 m).   Weight as of this encounter: 83.8 kg.  Malnutrition Type:  Nutrition Problem: Increased nutrient needs Etiology: cancer and cancer related treatments   Malnutrition  Characteristics:  Signs/Symptoms: estimated needs   Nutrition Interventions:  Interventions: Ensure Enlive (each supplement provides 350kcal and 20 grams of protein)   Radiology Studies: No results found.  Scheduled Meds:  amLODipine  5 mg Oral Daily   Chlorhexidine Gluconate Cloth  6 each Topical Q0600   dexamethasone (DECADRON) injection  4 mg Intravenous Q8H   feeding supplement (ENSURE ENLIVE)  237 mL Oral TID BM   heparin injection (subcutaneous)  5,000 Units Subcutaneous Q8H   lidocaine  1 patch Transdermal Q24H   pantoprazole  40 mg Oral Daily   sodium chloride flush  3 mL Intravenous Once   Continuous Infusions:  lactated ringers 100 mL/hr at 02/23/20 0830    LOS: 9 days   Kerney Elbe, DO [facility] PAGER is on AMION  If 7PM-7AM, please contact night-coverage [email or web address]

## 2020-02-23 NOTE — Progress Notes (Addendum)
Nutrition Follow-up  DOCUMENTATION CODES:   Obesity unspecified  INTERVENTION:   -Continue MVI with minerals daily -Increase Ensure Enlive po to TID, each supplement provides 350 kcal and 20 grams of protein  NUTRITION DIAGNOSIS:   Increased nutrient needs related to cancer and cancer related treatments as evidenced by estimated needs.  Ongoing  GOAL:   Patient will meet greater than or equal to 90% of their needs  Progressing   MONITOR:   Supplement acceptance, PO intake, Diet advancement, Labs, Weight trends, Skin, I & O's  REASON FOR ASSESSMENT:   Malnutrition Screening Tool    ASSESSMENT:   Trevor Shelton is a 54 y.o. male with no significant past medical history presents to emergency department with generalized weakness, left rib pain, lower back pain, constipation since 1 month.  8/11- sp tunneled HD cath placement, first HD treatment 8/13- s/p bone marrow biopsy  Reviewed I/O's: -810 ml x 24 hours and +5.4 L since admission  UOP: 1.7 L x 24 hours  Per nephology notes, no plans for RRT now. Last HD 02/19/20.   Pt for radiation treatment at [place] with variable intake; noted meal completion 25-100%. Pt mostly taking Ensure supplements.   Medications reviewed and include lactated ringers infusion @ 100 ml/hr.   Labs reviewed: Na: 133.   Diet Order:   Diet Order            Diet regular Room service appropriate? Yes with Assist; Fluid consistency: Thin  Diet effective now                 EDUCATION NEEDS:   No education needs have been identified at this time  Skin:  Skin Assessment: Reviewed RN Assessment  Last BM:  02/21/20  Height:   Ht Readings from Last 1 Encounters:  02/14/20 _0  (1.6 m)    Weight:   Wt Readings from Last 1 Encounters:  02/22/20 83.8 kg    Ideal Body Weight:  56.4 kg  BMI:  Body mass index is 32.73 kg/m.  Estimated Nutritional Needs:   Kcal:  6728-9791  Protein:  85-100 grams  Fluid:  1000 ml+  UOP    Loistine Chance, RD, LDN, [place] Registered Dietitian II Certified Diabetes Care and Education Specialist Please refer to [facility] for RD and/or RD on-call/weekend/after hours pager

## 2020-02-23 NOTE — Progress Notes (Signed)
Occupational Therapy Treatment Patient Details Name: Trevor Shelton MRN: [id number] DOB: 1966-06-29 Today's Date: 02/23/2020    History of present illness Patient is a 54 y/o male who presents with generalized weakness, rib pain, back pain. Admitted with hypercalcemia and AKI. Abdominal CT- lytic lesions throughout skeleton consistent with multiple myeloma or metastatic disease. Pathological fxs T5, T9 and acute rib fxs left 9-10th. No PMH.   OT comments  Pt. Was seen for skilled OT to maximize I and safety with ADLs and mobility. Pt. Was able to sit EOB and perform ADLs. Pt. Required s for static balance and min guard assist for dynamic. Pt. Was motivated to work with OT and will be followed.   Follow Up Recommendations  CIR;Other (comment)    Equipment Recommendations       Recommendations for Other Services Rehab consult;Other (comment)    Precautions / Restrictions Precautions Precautions: Fall Precaution Comments: marked weakness BLEs L >R Restrictions Weight Bearing Restrictions: No       Mobility Bed Mobility     Rolling: Min assist Sidelying to sit: Mod assist Supine to sit: Mod assist Sit to supine: Mod assist Sit to sidelying: Min assist    Transfers       Sit to Stand: Mod assist (from bed)              Balance     Sitting balance-Leahy Scale: Poor Sitting balance - Comments: Reliant on BUE support,  min guard                                   ADL either performed or assessed with clinical judgement   ADL Overall ADL's : Needs assistance/impaired     Grooming: Min guard;Wash/dry hands;Wash/dry face   Upper Body Bathing: Minimal assistance;Sitting   Lower Body Bathing: Moderate assistance;Sit to/from stand   Upper Body Dressing : Minimal assistance;Sitting   Lower Body Dressing: Maximal assistance;Sit to/from stand               Functional mobility during ADLs: Moderate assistance (MOd A with sit to stand from bed. )        Vision       Perception     Praxis      Cognition Arousal/Alertness: Awake/alert Behavior During Therapy: WFL for tasks assessed/performed Overall Cognitive Status: Within Functional Limits for tasks assessed                                 General Comments: per chart patient does not understand he has cancer        Exercises     Shoulder Instructions       General Comments Pt. wife present during ssession    Pertinent Vitals/ Pain       Pain Assessment: 0-10 Pain Score: 9  Faces Pain Scale: Hurts even more Pain Location: back, ribs Pain Descriptors / Indicators: Discomfort;Sore;Guarding;Grimacing Pain Intervention(s): Premedicated before session  Home Living                                          Prior Functioning/Environment              Frequency  Min 2X/week        Progress Toward Goals  OT Goals(current goals can now be found in the care plan section)     Acute Rehab OT Goals Patient Stated Goal: less pain OT Goal Formulation: With patient Time For Goal Achievement: 03/01/20 Potential to Achieve Goals: Good ADL Goals Pt Will Perform Grooming: with modified independence;bed level Pt Will Perform Upper Body Bathing: with modified independence;bed level Pt Will Perform Lower Body Bathing: with min assist;with adaptive equipment;sit to/from stand Pt Will Transfer to Toilet: with min assist;stand pivot transfer;bedside commode Additional ADL Goal #1: pt will complete bed mobility supervision level with back precautions  Plan      Co-evaluation                 AM-PAC OT "6 Clicks" Daily Activity     Outcome Measure   Help from another person eating meals?: A Little Help from another person taking care of personal grooming?: A Little Help from another person toileting, which includes using toliet, bedpan, or urinal?: A Lot Help from another person bathing (including washing, rinsing, drying)?:  A Lot Help from another person to put on and taking off regular upper body clothing?: A Little Help from another person to put on and taking off regular lower body clothing?: A Lot 6 Click Score: 15    End of Session Equipment Utilized During Treatment: Gait belt;Rolling walker  OT Visit Diagnosis: Unsteadiness on feet (R26.81)   Activity Tolerance Patient limited by pain   Patient Left in bed;with call bell/phone within reach;with bed alarm set;with nursing/sitter in room   Nurse Communication Mobility status;Precautions        Time: 8563-1497 OT Time Calculation (min): 26 min  Charges: OT General Charges $OT Visit: 1 Visit OT Treatments $Self Care/Home Management : 23-37 mins  Reece Packer OT/L   Iverna Hammac 02/23/2020, 2:07 PM

## 2020-02-23 NOTE — Progress Notes (Signed)
Spoke with the patient's nurse Beverline to let her know that he has a radiation treatment scheduled today at 2:30 pm pm.  I asked her to call [facility] and set up transportation to have him here at 2:15 pm.  I let her know that he has radiation treatments daily (Monday-Friday).  His next scheduled treatment is 02/24/2020 at 2:00 pm and he would need to arrive at the cancer center via [facility] at 1:45 pm.  Will continue to follow as necessary.  Gloriajean Dell. Leonie Green, BSN

## 2020-02-23 NOTE — Progress Notes (Signed)
[facility] Progress Note   54 y.o.malewith no significant pPMH who p/wgeneralized weakness, left rib and lower back pain + constipation for a mth aftersustaininga fall from a ladder a mth ago. Pain is worse with deep inspiration and also has noted decreased urine output as well but denies obstructive like symptoms or incontinence.Noted on on CT to have diffuse lyticlesions throughout the skeletonand pathologicT5 and T9 , left 9th and 10th ribfractures.T6 level tumor extension narrowing thecal sac.He has been using Naproxen daily for the past 15 days.  Assessment/ Plan:   1. Acute kidney Injury - presented with an acute elevation in Cr 4.10 (GFR 15) with significant electrolyte abnormalities. Notibly hypercalcemia (>15). Patient's kidney function has improved with 2 episodes of RRT (08/11 and 08/13). Currently being worked up for monoclonal gammopathy with concern for MM. Bone marrow biopsy results pending.  - Continue conservative management with oral hydration - Continue work-up for multiple myeloma, pending BM biopsy. - No indication for RRT or HD currently.  - Strict I&O and monitor renal function daily  2. Hypercalcemia: - Hypercalcemia likely secondary to MM that has returned to normal limits today.  3. HTN - elevated to day at 167/91 on amlodipine 5 mg and prn hydralazine 10 mg for SBP > 160 and DBP > 90 - May benefit form increasing amlodipine to 10 mg daily.   4. Hyponatremia - likely pseudohyponatremia in the setting of increased serum proteins. Stable at 133.  5. Constipation - secondary to hypercalcemia. Management per primary team.   Subjective:   Patient states that he is feeling ok, but is concerned regarding his diagnosis. He states that he is urinating well but continues to have some abdominal distension and bloating. Otherwise, he denies new symptoms at this time.    Objective:   BP (!) 167/91 (BP Location: Right Arm)   Pulse 73   Temp 97.6  F (36.4 C) (Oral)   Resp 16   Ht '5\' 3"'  (1.6 m)   Wt 83.8 kg   SpO2 98%   BMI 32.73 kg/m   Intake/Output Summary (Last 24 hours) at 02/23/2020 0947 Last data filed at 02/23/2020 0542 Gross per 24 hour  Intake 840 ml  Output 1650 ml  Net -810 ml   Weight change:   Physical Exam: Physical Exam Constitutional:      General: He is not in acute distress. HENT:     Head: Normocephalic and atraumatic.  Eyes:     Extraocular Movements: Extraocular movements intact.  Cardiovascular:     Rate and Rhythm: Normal rate.     Pulses: Normal pulses.     Heart sounds: Normal heart sounds.  Pulmonary:     Effort: Pulmonary effort is normal.     Breath sounds: Normal breath sounds.  Abdominal:     General: There is distension.     Tenderness: There is abdominal tenderness (bloating and diffuse mild tenderness).  Musculoskeletal:        General: Swelling (minimal lower extremity swelling) present. Normal range of motion.  Skin:    General: Skin is warm and dry.  Neurological:     General: No focal deficit present.     Mental Status: He is alert. Mental status is at baseline.  Psychiatric:        Mood and Affect: Mood normal.     Imaging: No results found.  Labs: BMET Recent Labs  Lab 02/17/20 1800 02/18/20 0250 02/19/20 4235 02/20/20 0411 02/21/20 3614 02/21/20 2131 02/23/20 4315  NA 129* 135 130* 134* 135 132* 133*  K 4.2 4.1 4.5 4.1 4.1 4.7 4.5  CL 97* 101 99 102 105 103 104  CO2 22 26 21* 24 22 20* 21*  GLUCOSE 95 102* 147* 145* 129* 161* 152*  BUN 65* 36* 52* 33* 52* 65* 63*  CREATININE 4.68* 3.57* 4.10* 2.69* 3.00* 2.86* 2.51*  CALCIUM 14.0* 11.1* 10.9* 8.9 9.1 9.1 9.5  PHOS 5.3* 3.0 3.6 3.0 3.5 3.0 3.5   CBC Recent Labs  Lab 02/20/20 0411 02/21/20 0219 02/21/20 2131 02/23/20 0306  WBC 7.9 7.2 8.4 10.1  NEUTROABS 6.9 6.1 7.6 8.8*  HGB 9.9* 9.3* 9.4* 10.6*  HCT 28.4* 27.5* 27.4* 30.6*  MCV 85.3 85.7 84.8 86.0  PLT 188 194 190 213    Medications:     . amLODipine  5 mg Oral Daily  . Chlorhexidine Gluconate Cloth  6 each Topical Q0600  . dexamethasone (DECADRON) injection  4 mg Intravenous Q8H  . feeding supplement (ENSURE ENLIVE)  237 mL Oral BID BM  . heparin injection (subcutaneous)  5,000 Units Subcutaneous Q8H  . lidocaine  1 patch Transdermal Q24H  . pantoprazole  40 mg Oral Daily  . sodium chloride flush  3 mL Intravenous Once      Elmarie Shiley, MD 02/23/2020, 9:47 AM

## 2020-02-23 NOTE — Progress Notes (Signed)
Transportation with [facility] up for 8/18-8/20/21   Pick up time : 1:15pm-1:45 to [facility] center.

## 2020-02-23 NOTE — Progress Notes (Signed)
Physical Therapy Treatment Patient Details Name: Trevor Shelton MRN: [id number] DOB: 04/25/1966 Today's Date: 02/23/2020    History of Present Illness Pt is a 54 y.o. male admitted 02/14/20 with progressive BLE weakness, rib/back pain and constipation for one month since fall from ladder. Abdominal/pelvic CT shows diffuse lytic lesions throughout skeleton; pathologic fxs of T5 and T9, acute L rib 9-10 fxs. Also with AKI, hypercalcemia. Per neuro, pt with likely multiple myeloma and diffuse spinal involvement including high grade epidural disease at T6. Pt has begun radiation tx while admitted. PMH includes HTN.   PT Comments    Pt progressing with mobility. Today's session focused on transfer training with Stedy standing frame, pt able to perform multiple sit<>stands from bed, BSC and recliner with mod-maxA+2; assisting well with BUEs, although BLEs seem to be getting weaker with more ataxic-like movement (LLE>RLE). Pt dependent for standing ADL tasks. Despite pain and fatigue, pt motivated to participate; wife present and supportive. Continue to recommend intensive CIR-level therapies to maximize functional mobility and independence.  Session performed via [place] interpreter Fredrich Romans [phone])    Follow Up Recommendations  CIR;Supervision/Assistance - 24 hour     Equipment Recommendations  Wheelchair (measurements PT);Wheelchair cushion (measurements PT);3in1 (PT) (TBD)    Recommendations for Other Services       Precautions / Restrictions Precautions Precautions: Back;Fall;Other (comment) Precaution Comments: Significant BLE weakness and ataxia-like movements (LLE>RLE); back and rib precautions for comfort Restrictions Weight Bearing Restrictions: No    Mobility  Bed Mobility Overal bed mobility: Needs Assistance Bed Mobility: Rolling Rolling: Min assist Sidelying to sit: Mod assist Supine to sit: Mod assist Sit to supine: Mod assist Sit to sidelying: Min  assist General bed mobility comments: HOB elevated, minA to assist pelvic rotation and assist RUE to reach bed rail, modA for trunk elevation from sidelying  Transfers Overall transfer level: Needs assistance Equipment used: Ambulation equipment used Transfers: Sit to/from Stand Sit to Stand: Mod assist (from bed) Stand pivot transfers: Mod assist;Max assist;+2 physical assistance       General transfer comment: Multiple sit<>stands from bed, BSC and recliner with Stedy frame, pt assisting well with BUE support on Stedy bar, requiring mod-maxA+2 for trunk elevation and hip extension  Ambulation/Gait                 Stairs             Wheelchair Mobility    Modified Rankin (Stroke Patients Only)       Balance Overall balance assessment: Needs assistance Sitting-balance support: Feet supported;Bilateral upper extremity supported Sitting balance-Leahy Scale: Poor Sitting balance - Comments: Reliant on BUE support,  min guard   Standing balance support: Bilateral upper extremity supported;During functional activity Standing balance-Leahy Scale: Poor Standing balance comment: Reliant on BUE support and external assist; dependent for posterior pericare                            Cognition Arousal/Alertness: Awake/alert Behavior During Therapy: WFL for tasks assessed/performed Overall Cognitive Status: Within Functional Limits for tasks assessed                                 General Comments: WFL for simple tasks via Spanish interpreter, able to communicate via some basic English as well      Exercises General Exercises - Lower Extremity Ankle Circles/Pumps: AROM;Both;Seated (ataxic-like movement, decreased muscle  activation L ankle) Gluteal Sets: AROM;Both;Seated (5-sec holds)    General Comments General comments (skin integrity, edema, etc.): Wife present during session. [employer] ipad Spanish interpreter utilized Fredrich Romans  [phone]).      Pertinent Vitals/Pain Pain Assessment: Faces Pain Score: 9  Faces Pain Scale: Hurts little more Pain Location: All over, including back, anterior ribs, BLEs Pain Descriptors / Indicators: Discomfort;Sore;Guarding;Grimacing Pain Intervention(s): Monitored during session    Home Living                      Prior Function            PT Goals (current goals can now be found in the care plan section) Acute Rehab PT Goals Patient Stated Goal: less pain Progress towards PT goals: Progressing toward goals    Frequency    Min 3X/week      PT Plan Current plan remains appropriate    Co-evaluation              AM-PAC PT "6 Clicks" Mobility   Outcome Measure  Help needed turning from your back to your side while in a flat bed without using bedrails?: A Lot Help needed moving from lying on your back to sitting on the side of a flat bed without using bedrails?: A Lot Help needed moving to and from a bed to a chair (including a wheelchair)?: A Lot Help needed standing up from a chair using your arms (e.g., wheelchair or bedside chair)?: A Lot Help needed to walk in hospital room?: Total Help needed climbing 3-5 steps with a railing? : Total 6 Click Score: 10    End of Session Equipment Utilized During Treatment: Gait belt Activity Tolerance: Patient tolerated treatment well Patient left: in chair;with call bell/phone within reach;with chair alarm set;with family/visitor present Nurse Communication: Mobility status;Need for lift equipment PT Visit Diagnosis: Pain;Muscle weakness (generalized) (M62.81);Difficulty in walking, not elsewhere classified (R26.2);Other abnormalities of gait and mobility (R26.89)     Time: 9323-5573 PT Time Calculation (min) (ACUTE ONLY): 35 min  Charges:  $Therapeutic Activity: 23-37 mins                     Mabeline Caras, PT, DPT Acute Rehabilitation Services  Pager [phone] Office [place] 02/23/2020, 2:15 PM

## 2020-02-24 ENCOUNTER — Ambulatory Visit
Admit: 2020-02-24 | Discharge: 2020-02-24 | Disposition: A | Discharge status: Home / Self Care | Payer: Self-pay | Attending: Radiation Oncology | Admitting: Radiation Oncology

## 2020-02-24 LAB — COMPREHENSIVE METABOLIC PANEL
ALT: 56 U/L — ABNORMAL HIGH (ref 0–44)
AST: 31 U/L (ref 15–41)
Albumin: 2.4 g/dL — ABNORMAL LOW (ref 3.5–5.0)
Alkaline Phosphatase: 109 U/L (ref 38–126)
Anion gap: 7 (ref 5–15)
BUN: 57 mg/dL — ABNORMAL HIGH (ref 6–20)
CO2: 23 mmol/L (ref 22–32)
Calcium: 9.5 mg/dL (ref 8.9–10.3)
Chloride: 102 mmol/L (ref 98–111)
Creatinine, Ser: 2.24 mg/dL — ABNORMAL HIGH (ref 0.61–1.24)
GFR calc Af Amer: 37 mL/min — ABNORMAL LOW (ref >60)
GFR calc non Af Amer: 32 mL/min — ABNORMAL LOW (ref >60)
Glucose, Bld: 183 mg/dL — ABNORMAL HIGH (ref 70–99)
Potassium: 4.6 mmol/L (ref 3.5–5.1)
Sodium: 132 mmol/L — ABNORMAL LOW (ref 135–145)
Total Bilirubin: 0.4 mg/dL (ref 0.3–1.2)
Total Protein: 5.7 g/dL — ABNORMAL LOW (ref 6.5–8.1)

## 2020-02-24 LAB — CBC WITH DIFFERENTIAL/PLATELET
Abs Immature Granulocytes: 0.33 10*3/uL — ABNORMAL HIGH (ref 0.00–0.07)
Basophils Absolute: 0 10*3/uL (ref 0.0–0.1)
Basophils Relative: 0 %
Eosinophils Absolute: 0 10*3/uL (ref 0.0–0.5)
Eosinophils Relative: 0 %
HCT: 30.6 % — ABNORMAL LOW (ref 39.0–52.0)
Hemoglobin: 10.6 g/dL — ABNORMAL LOW (ref 13.0–17.0)
Immature Granulocytes: 4 %
Lymphocytes Relative: 5 %
Lymphs Abs: 0.5 10*3/uL — ABNORMAL LOW (ref 0.7–4.0)
MCH: 29.3 pg (ref 26.0–34.0)
MCHC: 34.6 g/dL (ref 30.0–36.0)
MCV: 84.5 fL (ref 80.0–100.0)
Monocytes Absolute: 0.5 10*3/uL (ref 0.1–1.0)
Monocytes Relative: 5 %
Neutro Abs: 8 10*3/uL — ABNORMAL HIGH (ref 1.7–7.7)
Neutrophils Relative %: 86 %
Platelets: 210 10*3/uL (ref 150–400)
RBC: 3.62 MIL/uL — ABNORMAL LOW (ref 4.22–5.81)
RDW: 11.4 % — ABNORMAL LOW (ref 11.5–15.5)
WBC: 9.3 10*3/uL (ref 4.0–10.5)
nRBC: 0 % (ref 0.0–0.2)

## 2020-02-24 LAB — PHOSPHORUS: Phosphorus: 3.5 mg/dL (ref 2.5–4.6)

## 2020-02-24 LAB — MAGNESIUM: Magnesium: 1.7 mg/dL (ref 1.7–2.4)

## 2020-02-24 LAB — GLUCOSE, CAPILLARY: Glucose-Capillary: 176 mg/dL — ABNORMAL HIGH (ref 70–99)

## 2020-02-24 MED ORDER — INSULIN ASPART 100 UNIT/ML ~~LOC~~ SOLN
0.0000 [IU] | Freq: Three times a day (TID) | SUBCUTANEOUS | Status: DC
  Administered 2020-02-25: 2 [IU] via SUBCUTANEOUS
  Administered 2020-02-25: 1 [IU] via SUBCUTANEOUS
  Administered 2020-02-26: 3 [IU] via SUBCUTANEOUS
  Administered 2020-02-26: 7 [IU] via SUBCUTANEOUS
  Administered 2020-02-26: 1 [IU] via SUBCUTANEOUS
  Administered 2020-02-27 (×2): 3 [IU] via SUBCUTANEOUS
  Administered 2020-02-27 – 2020-02-28 (×2): 2 [IU] via SUBCUTANEOUS
  Administered 2020-02-28: 5 [IU] via SUBCUTANEOUS
  Administered 2020-02-28: 7 [IU] via SUBCUTANEOUS
  Administered 2020-02-29: 2 [IU] via SUBCUTANEOUS
  Administered 2020-02-29: 5 [IU] via SUBCUTANEOUS
  Administered 2020-02-29: 7 [IU] via SUBCUTANEOUS
  Administered 2020-03-01 (×2): 5 [IU] via SUBCUTANEOUS
  Administered 2020-03-01: 2 [IU] via SUBCUTANEOUS
  Administered 2020-03-02: 3 [IU] via SUBCUTANEOUS
  Administered 2020-03-02: 7 [IU] via SUBCUTANEOUS
  Administered 2020-03-02: 5 [IU] via SUBCUTANEOUS
  Administered 2020-03-03: 2 [IU] via SUBCUTANEOUS

## 2020-02-24 MED ORDER — AMLODIPINE BESYLATE 10 MG PO TABS
10.0000 mg | ORAL_TABLET | Freq: Every day | ORAL | Status: DC
  Administered 2020-02-24 – 2020-03-03 (×9): 10 mg via ORAL
  Filled 2020-02-24 (×9): qty 1

## 2020-02-24 MED ORDER — CLONAZEPAM 0.125 MG PO TBDP
0.2500 mg | ORAL_TABLET | Freq: Three times a day (TID) | ORAL | Status: DC | PRN
  Administered 2020-02-25 (×2): 0.25 mg via ORAL
  Filled 2020-02-24 (×2): qty 1

## 2020-02-24 MED ORDER — POLYETHYLENE GLYCOL 3350 17 G PO PACK
17.0000 g | PACK | Freq: Every day | ORAL | Status: DC
  Administered 2020-02-25 – 2020-03-03 (×8): 17 g via ORAL
  Filled 2020-02-24 (×8): qty 1

## 2020-02-24 NOTE — Progress Notes (Signed)
[facility] Progress Note    Assessment/ Plan:   1. Acute kidney Injury secondary to multiple myeloma - Patients renal function continues to improve with a Cr of 2.24 today. Bone marrow biopsy significant for 20-30% plasma cells and waiting on cytogenetics and FISH studies.  - Continue patient on maintenance fluids with 100 cc/hr of LR - Oncology plan to start treatment with chemo and radiation once discharged - Will set up a follow up appointment in the OP with nephrology.   2. Hypercalcemia - has resolved.  3. Hyponatremia - mild hyponatremia that should resolved with improved PO intake.  - No acute need for HD  4. Constipation - secondary hypercalcemia. Continue bowel regimen with Miralax, Senokot, and Dulcolax.   Subjective:    Patient continues to have abdominal pain due to constipation. He also has a difficult time ambulating due to back pain and lower extremity weakness. Otherwise he states that he is urinating well without pain or hematuria.    Objective:   BP (!) 166/92 (BP Location: Left Arm)   Pulse 67   Temp 97.7 F (36.5 C) (Oral)   Resp 15   Ht 5' 3" (1.6 m)   Wt 83.8 kg   SpO2 99%   BMI 32.73 kg/m   Intake/Output Summary (Last 24 hours) at 02/24/2020 0723 Last data filed at 02/24/2020 0610 Gross per 24 hour  Intake 600 ml  Output 3400 ml  Net -2800 ml   Weight change:   Physical Exam: Physical Exam Constitutional:      General: He is not in acute distress.    Appearance: He is not diaphoretic.  HENT:     Head: Normocephalic and atraumatic.  Cardiovascular:     Rate and Rhythm: Normal rate.     Heart sounds: Normal heart sounds.  Pulmonary:     Effort: Pulmonary effort is normal. No respiratory distress.  Abdominal:     General: Bowel sounds are normal. There is no distension.     Palpations: Abdomen is soft.     Tenderness: There is abdominal tenderness (diffuse).  Skin:    General: Skin is warm.  Neurological:     General: No  focal deficit present.     Mental Status: He is alert.  Psychiatric:        Mood and Affect: Mood is anxious.      Imaging: No results found.  Labs: BMET Recent Labs  Lab 02/18/20 0250 02/19/20 0322 02/20/20 0411 02/21/20 0219 02/21/20 2131 02/23/20 0306 02/24/20 0422  NA 135 130* 134* 135 132* 133* 132*  K 4.1 4.5 4.1 4.1 4.7 4.5 4.6  CL 101 99 102 105 103 104 102  CO2 26 21* 24 22 20* 21* 23  GLUCOSE 102* 147* 145* 129* 161* 152* 183*  BUN 36* 52* 33* 52* 65* 63* 57*  CREATININE 3.57* 4.10* 2.69* 3.00* 2.86* 2.51* 2.24*  CALCIUM 11.1* 10.9* 8.9 9.1 9.1 9.5 9.5  PHOS 3.0 3.6 3.0 3.5 3.0 3.5 3.5   CBC Recent Labs  Lab 02/21/20 0219 02/21/20 2131 02/23/20 0306 02/24/20 0422  WBC 7.2 8.4 10.1 9.3  NEUTROABS 6.1 7.6 8.8* 8.0*  HGB 9.3* 9.4* 10.6* 10.6*  HCT 27.5* 27.4* 30.6* 30.6*  MCV 85.7 84.8 86.0 84.5  PLT 194 190 213 210    Medications:    . amLODipine  5 mg Oral Daily  . Chlorhexidine Gluconate Cloth  6 each Topical Q0600  . dexamethasone (DECADRON) injection  4 mg Intravenous Q8H  .   feeding supplement (ENSURE ENLIVE)  237 mL Oral TID BM  . heparin injection (subcutaneous)  5,000 Units Subcutaneous Q8H  . lidocaine  1 patch Transdermal Q24H  . pantoprazole  40 mg Oral Daily  . sodium chloride flush  3 mL Intravenous Once      , D.O. [facility], PGY-2 Pager: [phone], Phone: [phone] Date 02/24/2020 Time 5:06 PM    

## 2020-02-24 NOTE — Progress Notes (Signed)
Nutrition Follow-up  DOCUMENTATION CODES:   Obesity unspecified  INTERVENTION:   -Continue Ensure Enlive po TID, each supplement provides 350 kcal and 20 grams of protein -MVI with minerals daily  NUTRITION DIAGNOSIS:   Increased nutrient needs related to cancer and cancer related treatments as evidenced by estimated needs.  Ongoing  GOAL:   Patient will meet greater than or equal to 90% of their needs  Progressing   MONITOR:   Supplement acceptance, PO intake, Diet advancement, Labs, Weight trends, Skin, I & O's  REASON FOR ASSESSMENT:   Malnutrition Screening Tool    ASSESSMENT:   Trevor Shelton is a 54 y.o. male with no significant past medical history presents to emergency department with generalized weakness, left rib pain, lower back pain, constipation since 1 month.  8/11- sp tunneled HD cath placement, first HD treatment 8/13- s/p bone marrow biopsy  Reviewed I/O's: -2.8 L x 24 hours and +2.6 L since admission  UOP: 3.4 L x 24 hours  Case discussed with RN, who reports pt with fair appetite. He is scheduled for radiation treatment again this afternoon. Per RN, pt is able to speak and understand English, but requires an interpreter when describing more complex concepts. He is not requiring further HD as of now.   Spoke with pt at bedside, who was a little anxious at time of visit. He reports being frustrated about having to be in the hospital so long. RD allowed pt to share his feelings with this RD and provided active listening and emotional support.   Pt reports fair appetite, but complains of gas pain that comes and goes. Pt consumed about 75% of spaghetti meal (documented meal completion 25-100%). Pt reports consuming Ensure supplements.   Discussed importance of good meal and supplement intake to promote healing.   Labs reviewed: Na: 132.   NUTRITION - FOCUSED PHYSICAL EXAM:    Most Recent Value  Orbital Region No depletion  Upper Arm Region No  depletion  Thoracic and Lumbar Region No depletion  Buccal Region No depletion  Temple Region No depletion  Clavicle Bone Region No depletion  Clavicle and Acromion Bone Region No depletion  Scapular Bone Region No depletion  Dorsal Hand No depletion  Patellar Region No depletion  Anterior Thigh Region No depletion  Posterior Calf Region No depletion  Edema (RD Assessment) None  Hair Reviewed  Eyes Reviewed  Mouth Reviewed  Skin Reviewed  Nails Reviewed       Diet Order:   Diet Order            Diet regular Room service appropriate? Yes with Assist; Fluid consistency: Thin  Diet effective now                 EDUCATION NEEDS:   No education needs have been identified at this time  Skin:  Skin Assessment: Reviewed RN Assessment  Last BM:  02/21/20  Height:   Ht Readings from Last 1 Encounters:  02/14/20 '5\' 3"'  (1.6 m)    Weight:   Wt Readings from Last 1 Encounters:  02/22/20 83.8 kg    Ideal Body Weight:  56.4 kg  BMI:  Body mass index is 32.73 kg/m.  Estimated Nutritional Needs:   Kcal:  7035-0093  Protein:  85-100 grams  Fluid:  1000 ml+ UOP    Loistine Chance, RD, LDN, [place] Registered Dietitian II Certified Diabetes Care and Education Specialist Please refer to [facility] for RD and/or RD on-call/weekend/after hours pager

## 2020-02-24 NOTE — Progress Notes (Signed)
IP rehab admissions:  Continuing to follow.  Awaiting results of bone marrow biopsy and for further treatment plan.  I am holding on potential inpatient rehab admit until the above is completed.  Call me for questions.  [phone]

## 2020-02-24 NOTE — Progress Notes (Signed)
PROGRESS NOTE    Trevor Shelton  TDS:[id number] DOB: 1966/02/25 DOA: 02/14/2020 PCP: Patient, No Pcp Per    Chief Complaint  Patient presents with  . Weakness  . Abdominal Pain    Brief Narrative:   Chief Complaint: Generalized weakness, left rib pain, back pain since 2 weeks.  HPI: Trevor Shelton is a 54 y.o. male with no significant past medical history presents to emergency department with generalized weakness, left rib pain, lower back pain, constipation since 1 month.  Patient does not speak [place].  Patient's son at the bedside at the historian.  He tells me that patient fell off of a ladder from a height of around 4 feet 1 month ago and landed on his left side and since then he started having back pain, left rib pain, and weakness in bilateral lower extremities which is getting worse.  He came to ER on 7/20.  Imaging and UA came back negative for acute findings therefore patient discharged home on prednisone and Norco.  He was also evaluated by orthopedic outpatient and was administered 2 injections and prescribed a medication? with no help.  Reports severe pain in his back with burning sensation, left-sided rib pain, aggravates with deep inspiration.  Reports constipation and has noted decreased urinary output as well.  Reports progressive lower extremity weakness however denies fever, chills, urinary or bowel incontinence.  No history of headache, blurry vision, lightheadedness, dizziness, loss of consciousness, syncope, chest pain, shortness of breath, palpitation, leg swelling, dysuria, hematuria, sleep or weight changes.  No history of smoking, alcohol, listed drug use.   ED Course: Upon arrival to ED: Patient's vital signs are stable, CBC shows WBC of 13.3, UA negative for infection, CMP shows sodium of 125, severe AKI, calcium more than 15, lipase, TSH: WNL, COVID-19 and PTH: Pending.  CT abdomen pelvis shows diffuse lytic lesions throughout the skeleton consistent with  multiple myeloma or metastatic disease.  Pathologic fractures at T5 and T9, acute fracture of left rib 9 and 10.  He received IV fluid and Dilaudid in ED.  EDP consulted neurosurgery and oncology.  Oncology recommended to check SPEP and UPEP.  [facility] consulted for admission for hypercalcemia/AKI?  Multiple myeloma.  Interval history: He received renal replacement therapy, renal function improving with good urine output, nephrology signed off Hypercalcemia corrected Received urgent radiation therapy due to impending cord compression and painful meds diastatic lesions in the spine and ribs.  Subjective:  He is seen after returning from radiation therapy, wife at bedside  They do not speak English, translation service used  Denies back pain, complaining of bilateral rib pain, complaining bilateral lower leg weakness, denies bowel bladder incontinence Reports burning sensation with urination  Assessment & Plan:   Principal Problem:   Hypercalcemia Active Problems:   AKI (acute kidney injury) ([place])   Pathologic fracture   Hyponatremia   Leukocytosis   Kappa light chain myeloma ([place])  Multiple myeloma with multiple spine and rib lesions, present with weakness, AKI and hypercalcemia -Elevated serum kappa free light chain at 37.5, elevated kappa lambda light chain ratio at 3.79 -CT-guided bone marrow biopsy done on August 13, pathology is positive, showed " Hypercellular bone marrow for age with plasma cell neoplasm" -Neurosurgery Dr. Marcello Moores consulted who does not feel neurosurgical intervention is necessary he recommended steroid and radiation treatment --started on palliative  radiotherapy to the thoracic spine from T5-9 and to treat the left 9th and 10th rib.  XRT started  urgently to hopefully prevent  permanent paralysis -He is on dexamethasone 4 mg every 8 hours -Pain control -Appreciate oncology and radiation oncology input  Hypercalcemia -Calcium 15 on presentation,  he received per medronate on August 10, received calcitonin x3 days on aug 9,10 and 11. -He received hydration -Calcium normalized  AKI -BUN 84 creatinine 4.92 on presentation,  -Received 2 episode of renal replacement therapy on August 11 and August 13 -Renal function improving BUN 57 creatinine 2.24 today with good urine output -Home medication lisinopril HCTZ held since admission -No further plan of renal replacement therapy or hemodialysis, nephrology signed off, outpatient nephrology follow-up arranged  Hyponatremia Likely component of pseudohyponatremia in the setting of increased serum proteins Monitor  Mild elevated LFT -Negative hepatitis panel -CT abdomen pelvis hepatobiliary unremarkable -Check CK  New diagnosis of diabetes -A1c 6.5 -A.m. fasting blood glucose increasing likely due to steroid -Start SSI -Change diet to carb modified diet  Hypertension Currently on Norvasc   Constipation: Had a bowel movement today  Body mass index is 32.73 kg/m.   DVT prophylaxis: heparin injection 5,000 Units Start: 02/19/20 1400 SCDs Start: 02/14/20 1817   Code Status: Full Family Communication: Wife at bedside Disposition:   Status is: Inpatient    Dispo: The patient is from: home              Anticipated d/c is to: CIR              Anticipated d/c date is: getting XRT, then CIR                Consultants:   Med onc Dr Nigel Bridgeman onc  IR  Nephrology  neurosurgery  Procedures:   CT-guided bone marrow biopsy on August 13  Tunneled dialysis catheter placement August 11  Antimicrobials:   None     Objective: Vitals:   02/23/20 1426 02/23/20 2130 02/24/20 0608 02/24/20 1453  BP: (!) 148/82 (!) 164/91 (!) 166/92 (!) 158/90  Pulse: 83 78 67 89  Resp: _0 Temp: 97.8 F (36.6 C) 98.6 F (37 C) 97.7 F (36.5 C) 98.5 F (36.9 C)  TempSrc: Oral Oral Oral Oral  SpO2: 98% 98% 99% 98%  Weight:      Height:        Intake/Output  Summary (Last 24 hours) at 02/24/2020 1623 Last data filed at 02/24/2020 1017 Gross per 24 hour  Intake 2776.04 ml  Output 3200 ml  Net -423.96 ml   Filed Weights   02/19/20 1255 02/19/20 1630 02/22/20 0430  Weight: 78.6 kg 77.6 kg 83.8 kg    Examination:  General exam: Anxious,  Respiratory system: Clear to auscultation. Respiratory effort normal. Cardiovascular system: S1 & S2 heard, RRR. No JVD, no murmur, No pedal edema. Gastrointestinal system: Abdomen is nondistended, soft and nontender. No organomegaly or masses felt. Normal bowel sounds heard. Central nervous system: Alert and oriented. No focal neurological deficits. Extremities: Bilateral lower extremity weakness, able to lift against gravity at 30 degrees, some mild foot drop bilaterally, no edema Skin: No rashes, lesions or ulcers Psychiatry: Judgement and insight appear normal. Mood & affect appropriate.     Data Reviewed: I have personally reviewed following labs and imaging studies  CBC: Recent Labs  Lab 02/20/20 0411 02/21/20 0219 02/21/20 2131 02/23/20 0306 02/24/20 0422  WBC 7.9 7.2 8.4 10.1 9.3  NEUTROABS 6.9 6.1 7.6 8.8* 8.0*  HGB 9.9* 9.3* 9.4* 10.6* 10.6*  HCT 28.4* 27.5* 27.4* 30.6* 30.6*  MCV 85.3 85.7  84.8 86.0 84.5  PLT 188 194 190 213 157    Basic Metabolic Panel: Recent Labs  Lab 02/20/20 0411 02/21/20 0219 02/21/20 2131 02/23/20 0306 02/24/20 0422  NA 134* 135 132* 133* 132*  K 4.1 4.1 4.7 4.5 4.6  CL 102 105 103 104 102  CO2 24 22 20* 21* 23  GLUCOSE 145* 129* 161* 152* 183*  BUN 33* 52* 65* 63* 57*  CREATININE 2.69* 3.00* 2.86* 2.51* 2.24*  CALCIUM 8.9 9.1 9.1 9.5 9.5  MG 1.9 1.7 1.8 1.7 1.7  PHOS 3.0 3.5 3.0 3.5 3.5    GFR: Estimated Creatinine Clearance: 35.3 mL/min (A) (by C-G formula based on SCr of 2.24 mg/dL (H)).  Liver Function Tests: Recent Labs  Lab 02/20/20 0411 02/21/20 0219 02/21/20 2131 02/23/20 0306 02/24/20 0422  AST 63* 44* 40 37 31  ALT 55* 60*  57* 58* 56*  ALKPHOS 63 64 80 96 109  BILITOT 0.4 0.6 0.7 0.5 0.4  PROT 5.2* 5.3* 5.1* 5.5* 5.7*  ALBUMIN 2.2* 2.2* 2.2* 2.2* 2.4*    CBG: No results for input(s): GLUCAP in the last 168 hours.   No results found for this or any previous visit (from the past 240 hour(s)).       Radiology Studies: No results found.      Scheduled Meds: . amLODipine  10 mg Oral Daily  . Chlorhexidine Gluconate Cloth  6 each Topical Q0600  . dexamethasone (DECADRON) injection  4 mg Intravenous Q8H  . feeding supplement (ENSURE ENLIVE)  237 mL Oral TID BM  . heparin injection (subcutaneous)  5,000 Units Subcutaneous Q8H  . lidocaine  1 patch Transdermal Q24H  . pantoprazole  40 mg Oral Daily  . sodium chloride flush  3 mL Intravenous Once   Continuous Infusions: . lactated ringers 100 mL/hr at 02/24/20 0501     LOS: 10 days   Time spent: 35 mins Greater than 50% of this time was spent in counseling, explanation of diagnosis, planning of further management, and coordination of care.  I have personally reviewed and interpreted on  02/24/2020 daily labs, imagings as discussed above under date review session and assessment and plans.  I reviewed all nursing notes, pharmacy notes, consultant notes,  vitals, pertinent old records  I have discussed plan of care as described above with RN , patient and family on 02/24/2020  Voice Recognition /Dragon dictation system was used to create this note, attempts have been made to correct errors. Please contact the author with questions and/or clarifications.   Florencia Reasons, MD PhD FACP [facility]  Available via Epic secure chat 7am-7pm for nonurgent issues Please page for urgent issues To page the attending provider between 7A-7P or the covering provider during after hours 7P-7A, please log into the web site [email or web address] and access using universal [place] password for that web site. If you do not have the password, please call the hospital  operator.    02/24/2020, 4:23 PM

## 2020-02-25 ENCOUNTER — Ambulatory Visit
Admit: 2020-02-25 | Discharge: 2020-02-25 | Disposition: A | Discharge status: Home / Self Care | Payer: Self-pay | Attending: Radiation Oncology | Admitting: Radiation Oncology

## 2020-02-25 ENCOUNTER — Inpatient Hospital Stay (HOSPITAL_COMMUNITY): Payer: Self-pay

## 2020-02-25 LAB — GLUCOSE, CAPILLARY
Glucose-Capillary: 125 mg/dL — ABNORMAL HIGH (ref 70–99)
Glucose-Capillary: 157 mg/dL — ABNORMAL HIGH (ref 70–99)
Glucose-Capillary: 134 mg/dL — ABNORMAL HIGH (ref 70–99)
Glucose-Capillary: 261 mg/dL — ABNORMAL HIGH (ref 70–99)

## 2020-02-25 LAB — URINALYSIS, ROUTINE W REFLEX MICROSCOPIC
Bilirubin Urine: NEGATIVE
Glucose, UA: NEGATIVE mg/dL
Hgb urine dipstick: NEGATIVE
Ketones, ur: NEGATIVE mg/dL
Leukocytes,Ua: NEGATIVE
Nitrite: NEGATIVE
Protein, ur: NEGATIVE mg/dL
Specific Gravity, Urine: 1.008 (ref 1.005–1.030)
pH: 8 (ref 5.0–8.0)

## 2020-02-25 LAB — LIPID PANEL
Cholesterol: 175 mg/dL (ref 0–200)
HDL: 38 mg/dL — ABNORMAL LOW (ref >40)
LDL Cholesterol: 109 mg/dL — ABNORMAL HIGH (ref 0–99)
Total CHOL/HDL Ratio: 4.6 RATIO
Triglycerides: 142 mg/dL (ref <150)
VLDL: 28 mg/dL (ref 0–40)

## 2020-02-25 LAB — COMPREHENSIVE METABOLIC PANEL
ALT: 47 U/L — ABNORMAL HIGH (ref 0–44)
AST: 27 U/L (ref 15–41)
Albumin: 2.4 g/dL — ABNORMAL LOW (ref 3.5–5.0)
Alkaline Phosphatase: 111 U/L (ref 38–126)
Anion gap: 7 (ref 5–15)
BUN: 53 mg/dL — ABNORMAL HIGH (ref 6–20)
CO2: 23 mmol/L (ref 22–32)
Calcium: 9.5 mg/dL (ref 8.9–10.3)
Chloride: 100 mmol/L (ref 98–111)
Creatinine, Ser: 1.98 mg/dL — ABNORMAL HIGH (ref 0.61–1.24)
GFR calc Af Amer: 43 mL/min — ABNORMAL LOW (ref >60)
GFR calc non Af Amer: 37 mL/min — ABNORMAL LOW (ref >60)
Glucose, Bld: 190 mg/dL — ABNORMAL HIGH (ref 70–99)
Potassium: 4.9 mmol/L (ref 3.5–5.1)
Sodium: 130 mmol/L — ABNORMAL LOW (ref 135–145)
Total Bilirubin: 0.5 mg/dL (ref 0.3–1.2)
Total Protein: 5.7 g/dL — ABNORMAL LOW (ref 6.5–8.1)

## 2020-02-25 LAB — CK: Total CK: 49 U/L (ref 49–397)

## 2020-02-25 MED ORDER — PHENOL 1.4 % MT LIQD
1.0000 | OROMUCOSAL | Status: DC | PRN
  Administered 2020-02-25: 1 via OROMUCOSAL
  Filled 2020-02-25: qty 177

## 2020-02-25 NOTE — Progress Notes (Signed)
Physical Therapy Treatment Patient Details Name: Trevor Shelton MRN: [id number] DOB: 04-07-1966 Today's Date: 02/25/2020    History of Present Illness Pt is a 54 y.o. male admitted 02/14/20 with progressive BLE weakness, rib/back pain and constipation for one month since fall from ladder. Abdominal/pelvic CT shows diffuse lytic lesions throughout skeleton; pathologic fxs of T5 and T9, acute L rib 9-10 fxs. Also with AKI, hypercalcemia. Per neuro, pt with likely multiple myeloma and diffuse spinal involvement including high grade epidural disease at T6. Pt has begun palliative radiation tx while admitted. CT-guided bone marrow biopsy 8/13 with (+) pathology for "hypercellular bone marrow for age with plasma cell neoplasm." PMH includes HTN.   PT Comments    Pt continues with progressive BLE weakness and ataxic-like movements. Requires totalA to prevent bilateral knee buckling with standing attempts, reliant on Stedy for successful standing with maxA. Patient remains motivated to participate; able to use [facility] and transfer to recliner with Stedy. Session focused on transfer training and LE strengthening. Wife present and supportive. Continue to recommend intensive CIR-level therapies to maximize functional mobility and independence.   Follow Up Recommendations  CIR;Supervision/Assistance - 24 hour     Equipment Recommendations  Wheelchair (measurements PT);Wheelchair cushion (measurements PT);3in1 (PT) (TBD)    Recommendations for Other Services Rehab consult     Precautions / Restrictions Precautions Precautions: Back;Fall;Other (comment) Precaution Comments: Significant BLE weakness and ataxia-like movements (LLE>RLE); back and rib precautions for comfort due to fxs Restrictions Weight Bearing Restrictions: No    Mobility  Bed Mobility Overal bed mobility: Needs Assistance Bed Mobility: Rolling Rolling: Mod assist Sidelying to sit: Max assist       General bed mobility comments: ModA  for pelvic rotation to roll and assisting UE placement on rail, maxA for trunk elevation and scooting hips to EOB; pt with heavy reliance on BUE support, very poor control of BLEs  Transfers Overall transfer level: Needs assistance Equipment used: Ambulation equipment used Transfers: Sit to/from Stand Sit to Stand: Mod assist Stand pivot transfers: Total assist (Stedy frame)       General transfer comment: Multiple sit<>stands from bed, BSC and recliner with Stedy frame, pt assisting well with BUE support on Stedy bar, requires modA for trunk elevation and hip extension; heavy reliance on having bilateral knees blocked via stedy frame as pt with very weak extension with bilateral knees buckling; poor BLE control turning sideways, requiring maxA to correct in stedy frame. Attempted 1x standing from recliner with bilateral knees blocked, pt requiring totalA to prevent bilateral knee buckling and slide to floor due to lack of extension  Ambulation/Gait                 Stairs             Wheelchair Mobility    Modified Rankin (Stroke Patients Only)       Balance Overall balance assessment: Needs assistance Sitting-balance support: Feet supported;Bilateral upper extremity supported Sitting balance-Leahy Scale: Poor Sitting balance - Comments: Reliant on BUE support, can briefly maintain balance with single UE support; close min guard   Standing balance support: Bilateral upper extremity supported;During functional activity Standing balance-Leahy Scale: Zero                              Cognition Arousal/Alertness: Awake/alert Behavior During Therapy: WFL for tasks assessed/performed Overall Cognitive Status: Within Functional Limits for tasks assessed  General Comments: WFL for simple tasks via basic English; pt and wife declined use of ipad interpreter this session      Exercises Other Exercises Other  Exercises: Able to perform BLE LAQ, cannot maintain knee flex or ext against resistance, ataxic-like movement; minimal L ankle DF/PF noted    General Comments General comments (skin integrity, edema, etc.): Wife present and supportive; pt and wife declined use of ipad interpreter, communicating well via basic English      Pertinent Vitals/Pain Pain Assessment: Faces Faces Pain Scale: Hurts little more Pain Location: Especially anterior ribs, lower back, BLEs Pain Descriptors / Indicators: Discomfort;Sore;Guarding;Grimacing Pain Intervention(s): Monitored during session    Home Living                      Prior Function            PT Goals (current goals can now be found in the care plan section) Progress towards PT goals: Progressing toward goals    Frequency    Min 3X/week      PT Plan Current plan remains appropriate    Co-evaluation              AM-PAC PT "6 Clicks" Mobility   Outcome Measure  Help needed turning from your back to your side while in a flat bed without using bedrails?: A Lot Help needed moving from lying on your back to sitting on the side of a flat bed without using bedrails?: A Lot Help needed moving to and from a bed to a chair (including a wheelchair)?: Total Help needed standing up from a chair using your arms (e.g., wheelchair or bedside chair)?: Total Help needed to walk in hospital room?: Total Help needed climbing 3-5 steps with a railing? : Total 6 Click Score: 8    End of Session Equipment Utilized During Treatment: Gait belt Activity Tolerance: Patient tolerated treatment well Patient left: in chair;with call bell/phone within reach;with chair alarm set;with family/visitor present Nurse Communication: Mobility status;Need for lift equipment PT Visit Diagnosis: Pain;Muscle weakness (generalized) (M62.81);Difficulty in walking, not elsewhere classified (R26.2);Other abnormalities of gait and mobility (R26.89)     Time:  1008-1050 PT Time Calculation (min) (ACUTE ONLY): 42 min  Charges:  $Therapeutic Exercise: 8-22 mins $Therapeutic Activity: 23-37 mins                    Mabeline Caras, PT, DPT Acute Rehabilitation Services  Pager [phone] Office [place] 02/25/2020, 1:03 PM

## 2020-02-25 NOTE — Progress Notes (Signed)
Chaplain went to meet the Spanish interpreter with the patient and staff told me that this patient had been transferred to [facility] due to radiation and other treatments. He was not available to be seen. He will be staying at [facility] Conard Novak, Chaplain   02/25/20 1600  Clinical Encounter Type  Visited With Health care provider  Visit Type Initial  Referral From Physician  Consult/Referral To Chaplain  Spiritual Encounters  Spiritual Needs Other (Comment) (asked for spiritual support)

## 2020-02-25 NOTE — Progress Notes (Signed)
Oncology short note  SURGICAL PATHOLOGY  CASE: [id number]  PATIENT: Trevor Shelton  Bone Marrow Report      Clinical History: Lytic bone lesions, renal failure, right iliac bone,  (ADC)      DIAGNOSIS:   BONE MARROW, ASPIRATE, CLOT, CORE:  -Hypercellular bone marrow for age with plasma cell neoplasm  -See comment   PERIPHERAL BLOOD:  -Normocytic-normochromic anemia  -Neutrophilia and lymphopenia   COMMENT:   The bone marrow shows increased number of atypical plasma cells  representing 10% of cells in the aspirate associated with numerous  variably sized and occasionally large aggregates in the clot and biopsy  sections. CD138 highlights the increased plasma cell component in the  marrow which is estimated at 20 to 30% of the total cellular material by  immunohistochemistry. The plasma cells display kappa light chain  restriction consistent with plasma cell neoplasm. Correlation with  cytogenetic and FISH studies is recommended.   MICROSCOPIC DESCRIPTION:   PERIPHERAL BLOOD SMEAR: The red cells display mild anisopoikilocytosis  with mild polychromasia. The white blood cells are slightly increased in  number, mostly with neutrophils displaying normal granulation and  lobation. Significant neutrophilic left shift is not seen on scan. The  platelets are normal in number.   Component     Latest Ref Rng & Units 02/15/2020 02/17/2020 02/17/2020 02/25/2020         11:24 PM 11:24 PM   Total Protein, Urine-UPE24     Not Estab. mg/dL  21.3 21.7   Total Protein, Urine-Ur/day     30 - 150 mg/24 hr  277 (H) 282 (H)   ALBUMIN, U     %  53.1    ALPHA 1 URINE     %  12.4    Alpha 2, Urine     %  20.9    % BETA, Urine     %  9.9    GAMMA GLOBULIN URINE     %  3.7    Free Kappa Lt Chains,Ur     0.63 - 113.79 mg/L  26.29 26.29   Free Lambda Lt Chains,Ur     0.47 - 11.77 mg/L  3.17 3.21   Free Kappa/Lambda Ratio     1.03 - 31.76  8.29 8.19   Immunofixation Result,  Urine       Comment (A)    Total Volume       1,300 1,300   M-SPIKE %, Urine     Not Observed %  Not Observed    NOTE:       Comment    Total Protein ELP     6.0 - 8.5 g/dL 5.8 (L)     Albumin ELP     2.9 - 4.4 g/dL 2.9     Alpha-1-Globulin     0.0 - 0.4 g/dL 0.2     Alpha-2-Globulin     0.4 - 1.0 g/dL 0.7     Beta Globulin     0.7 - 1.3 g/dL 0.9     Gamma Globulin     0.4 - 1.8 g/dL 1.1     M-SPIKE, %     Not Observed g/dL 0.9 (H)     SPE Interp.      Comment     Comment      Comment     Globulin, Total     2.2 - 3.9 g/dL 2.9     A/G Ratio     0.7 - 1.7 1.0  FR KAPPA LT CH,24HR     Undefined mg/24 hr   34.18   FR LAMBDA LT CH,24HR     Undefined mg/24 hr   4.17   Immunofixation, Urine        Comment (A)   Kappa free light chain     3.3 - 19.4 mg/L 37.5 (H)     Lamda free light chains     5.7 - 26.3 mg/L 9.9     Kappa, lamda light chain ratio     0.26 - 1.65 3.79 (H)     Glucose-Capillary     70 - 99 mg/dL    157 (H)    A- - Newly diagnosed IgG Kappa multiple myeloma with skeletal involvement with cord compression. - renal insufficiency  -hypercalcemia -resolved PLAN -patient receiving XRT for cord compression -pathology reviewed. -Will need systemic therapy for multiple myeloma soon after completion of XRT. -Dr Lorenso Courier will f/u next week to decide rx choice -- CyBOrD. Vs alternative depending on renal function and Bone marrow cytogenetics. If standard risk cytogenetics/Myeloma FISH panel and patient in inpatient rehab -- could consider oral outpatient po regimen with Ixazomib/Cytoxan/Dexamethasone to reduce burden of care/travel to cancer center. -will need consideration for bone directed therapies after completion of RT -- zometa less prefer with renal insuff, could consider Xgeva or dose adjusted Pamidronate. -will defer further management to Dr Lorenso Courier. Call me If any acute concern this week.  Sullivan Lone

## 2020-02-25 NOTE — Progress Notes (Addendum)
PROGRESS NOTE    Trevor Shelton  SLP:[id number] DOB: 1966/02/24 DOA: 02/14/2020 PCP: Patient, No Pcp Per    Chief Complaint  Patient presents with  . Weakness  . Abdominal Pain    Brief Narrative:   Chief Complaint: Generalized weakness, left rib pain, back pain since 2 weeks.  HPI: Trevor Shelton is a 54 y.o. male with no significant past medical history presents to emergency department with generalized weakness, left rib pain, lower back pain, constipation since 1 month.  Patient does not speak [place].  Patient's son at the bedside at the historian.  He tells me that patient fell off of a ladder from a height of around 4 feet 1 month ago and landed on his left side and since then he started having back pain, left rib pain, and weakness in bilateral lower extremities which is getting worse.  He came to ER on 7/20.  Imaging and UA came back negative for acute findings therefore patient discharged home on prednisone and Norco.  He was also evaluated by orthopedic outpatient and was administered 2 injections and prescribed a medication? with no help.  Reports severe pain in his back with burning sensation, left-sided rib pain, aggravates with deep inspiration.  Reports constipation and has noted decreased urinary output as well.  Reports progressive lower extremity weakness however denies fever, chills, urinary or bowel incontinence.  No history of headache, blurry vision, lightheadedness, dizziness, loss of consciousness, syncope, chest pain, shortness of breath, palpitation, leg swelling, dysuria, hematuria, sleep or weight changes.  No history of smoking, alcohol, listed drug use.   ED Course: Upon arrival to ED: Patient's vital signs are stable, CBC shows WBC of 13.3, UA negative for infection, CMP shows sodium of 125, severe AKI, calcium more than 15, lipase, TSH: WNL, COVID-19 and PTH: Pending.  CT abdomen pelvis shows diffuse lytic lesions throughout the skeleton consistent with  multiple myeloma or metastatic disease.  Pathologic fractures at T5 and T9, acute fracture of left rib 9 and 10.  He received IV fluid and Dilaudid in ED.  EDP consulted neurosurgery and oncology.  Oncology recommended to check SPEP and UPEP.  [facility] hospitalist consulted for admission for hypercalcemia/AKI?  Multiple myeloma.  Interval history: He received renal replacement therapy, renal function improving with good urine output, nephrology signed off Hypercalcemia corrected Received urgent radiation therapy due to impending cord compression and painful meds diastatic lesions in the spine and ribs.  Subjective:  They do not speak English, translation service used, wife at bedside  C/o hemoptysis x1 this am, denies chest pain, denies short of breath, no hypoxia, no fever Denies back pain, complaining of bilateral rib pain, not able to lay on his side due to pain complaining bilateral lower leg weakness, denies bowel bladder incontinence Reports burning sensation with urination 2l urine output, having bm  Assessment & Plan:   Principal Problem:   Hypercalcemia Active Problems:   AKI (acute kidney injury) ([place])   Pathologic fracture   Hyponatremia   Leukocytosis   Kappa light chain myeloma (HCC)  Multiple myeloma with multiple spine and rib lesions, present with weakness, AKI and hypercalcemia -Elevated serum kappa free light chain at 37.5, elevated kappa lambda light chain ratio at 3.79 -CT-guided bone marrow biopsy done on August 13, pathology is positive, showed " Hypercellular bone marrow for age with plasma cell neoplasm" -Neurosurgery Dr. Marcello Moores consulted who does not feel neurosurgical intervention is necessary he recommended steroid and radiation treatment --started on palliative  radiotherapy to  the thoracic spine from T5-9 and to treat the left 9th and 10th rib.  XRT started  urgently to hopefully prevent permanent paralysis -He is on dexamethasone 4 mg every 8 hours -Pain  control, bowel regimen -He appears able to lift lower extremity higher today, continue to report bilateral rib pain -likely start chemo ASAP after finishing XRT, Appreciate oncology and radiation oncology input  Hemoptysis -X1 on August 19, no hypoxia, Hgb stable -Suspect mild from volume overload -Suspect will improve with renal function improved meant -cxr   Hypercalcemia -Calcium 15 on presentation, he received per medronate on August 10, received calcitonin x3 days on aug 9,10 and 11. -He received hydration -Calcium normalized  AKI -BUN 84 creatinine 4.92 on presentation,  -Received 2 episode of renal replacement therapy on August 11 and August 13 -Home medication lisinopril HCTZ held since admission -No further plan of renal replacement therapy or hemodialysis, nephrology signed off, outpatient nephrology follow-up arranged -Renal function improving BUN 53 creatinine 1.98 today with good urine output  Hyponatremia Likely component of pseudohyponatremia in the setting of increased serum proteins Monitor  Mild elevated LFT -Negative hepatitis panel -CT abdomen pelvis hepatobiliary unremarkable -CK 49 -slowing improving  New diagnosis of diabetes -A1c 6.5 -A.m. fasting blood glucose increasing likely due to steroid -Start SSI -Change diet to carb modified diet -A.m. blood glucose 190, will start Lantus 5 units at night  Hypertension Currently on Norvasc, blood pressure remains elevated, we will add low-dose Coreg   Constipation: Having bowel movement , continue MiraLAX daily  Body mass index is 28.78 kg/m.   DVT prophylaxis: heparin injection 5,000 Units Start: 02/19/20 1400 SCDs Start: 02/14/20 1817   Code Status: Full Family Communication: Wife at bedside Disposition:   Status is: Inpatient    Dispo: The patient is from: home              Anticipated d/c is to: CIR              Anticipated d/c date is: getting XRT, then CIR                 Consultants:   Med onc Dr Nigel Bridgeman onc  IR  Nephrology  neurosurgery  Procedures:   CT-guided bone marrow biopsy on August 13  Tunneled dialysis catheter placement August 11  XRT  Antimicrobials:   None     Objective: Vitals:   02/24/20 0608 02/24/20 1453 02/24/20 2128 02/25/20 0444  BP: (!) 166/92 (!) 158/90 (!) 157/89 (!) 156/92  Pulse: 67 89 85 78  Resp: '15 16 16 18  ' Temp: 97.7 F (36.5 C) 98.5 F (36.9 C) 98.3 F (36.8 C) 97.7 F (36.5 C)  TempSrc: Oral Oral Oral Oral  SpO2: 99% 98% 97% 98%  Weight:    73.7 kg  Height:        Intake/Output Summary (Last 24 hours) at 02/25/2020 0833 Last data filed at 02/25/2020 0630 Gross per 24 hour  Intake 3016.25 ml  Output 2050 ml  Net 966.25 ml   Filed Weights   02/19/20 1630 02/22/20 0430 02/25/20 0444  Weight: 77.6 kg 83.8 kg 73.7 kg    Examination:  General exam: Anxious,  Respiratory system: Clear to auscultation. Respiratory effort normal. Cardiovascular system: S1 & S2 heard, RRR. No JVD, no murmur, No pedal edema. Gastrointestinal system: Abdomen is nondistended, soft and nontender. No organomegaly or masses felt. Normal bowel sounds heard. Central nervous system: Alert and oriented. No focal neurological deficits. Extremities:  Bilateral lower extremity weakness, able to lift against gravity at 60 degrees, some mild foot drop bilaterally, no edema Skin: No rashes, lesions or ulcers Psychiatry: Judgement and insight appear normal. Mood & affect appropriate.     Data Reviewed: I have personally reviewed following labs and imaging studies  CBC: Recent Labs  Lab 02/20/20 0411 02/21/20 0219 02/21/20 2131 02/23/20 0306 02/24/20 0422  WBC 7.9 7.2 8.4 10.1 9.3  NEUTROABS 6.9 6.1 7.6 8.8* 8.0*  HGB 9.9* 9.3* 9.4* 10.6* 10.6*  HCT 28.4* 27.5* 27.4* 30.6* 30.6*  MCV 85.3 85.7 84.8 86.0 84.5  PLT 188 194 190 213 527    Basic Metabolic Panel: Recent Labs  Lab 02/20/20 0411 02/20/20 0411  02/21/20 0219 02/21/20 2131 02/23/20 0306 02/24/20 0422 02/25/20 0340  NA 134*   < > 135 132* 133* 132* 130*  K 4.1   < > 4.1 4.7 4.5 4.6 4.9  CL 102   < > 105 103 104 102 100  CO2 24   < > 22 20* 21* 23 23  GLUCOSE 145*   < > 129* 161* 152* 183* 190*  BUN 33*   < > 52* 65* 63* 57* 53*  CREATININE 2.69*   < > 3.00* 2.86* 2.51* 2.24* 1.98*  CALCIUM 8.9   < > 9.1 9.1 9.5 9.5 9.5  MG 1.9  --  1.7 1.8 1.7 1.7  --   PHOS 3.0  --  3.5 3.0 3.5 3.5  --    < > = values in this interval not displayed.    GFR: Estimated Creatinine Clearance: 37.5 mL/min (A) (by C-G formula based on SCr of 1.98 mg/dL (H)).  Liver Function Tests: Recent Labs  Lab 02/21/20 0219 02/21/20 2131 02/23/20 0306 02/24/20 0422 02/25/20 0340  AST 44* 40 37 31 27  ALT 60* 57* 58* 56* 47*  ALKPHOS 64 80 96 109 111  BILITOT 0.6 0.7 0.5 0.4 0.5  PROT 5.3* 5.1* 5.5* 5.7* 5.7*  ALBUMIN 2.2* 2.2* 2.2* 2.4* 2.4*    CBG: Recent Labs  Lab 02/24/20 2125 02/25/20 0755  GLUCAP 176* 125*     No results found for this or any previous visit (from the past 240 hour(s)).       Radiology Studies: No results found.      Scheduled Meds: . amLODipine  10 mg Oral Daily  . Chlorhexidine Gluconate Cloth  6 each Topical Q0600  . dexamethasone (DECADRON) injection  4 mg Intravenous Q8H  . feeding supplement (ENSURE ENLIVE)  237 mL Oral TID BM  . heparin injection (subcutaneous)  5,000 Units Subcutaneous Q8H  . insulin aspart  0-9 Units Subcutaneous TID WC  . lidocaine  1 patch Transdermal Q24H  . pantoprazole  40 mg Oral Daily  . polyethylene glycol  17 g Oral Daily  . sodium chloride flush  3 mL Intravenous Once   Continuous Infusions:    LOS: 11 days   Time spent: 35 mins Greater than 50% of this time was spent in counseling, explanation of diagnosis, planning of further management, and coordination of care.  I have personally reviewed and interpreted on  02/25/2020 daily labs, imagings as discussed  above under date review session and assessment and plans.  I reviewed all nursing notes, pharmacy notes, consultant notes,  vitals, pertinent old records  I have discussed plan of care as described above with RN , patient and family on 02/25/2020  Voice Recognition /Dragon dictation system was used to create this note, attempts  have been made to correct errors. Please contact the author with questions and/or clarifications.   Florencia Reasons, MD PhD FACP [facility]  Available via Epic secure chat 7am-7pm for nonurgent issues Please page for urgent issues To page the attending provider between 7A-7P or the covering provider during after hours 7P-7A, please log into the web site [email or web address] and access using universal [place] password for that web site. If you do not have the password, please call the hospital operator.    02/25/2020, 8:33 AM

## 2020-02-25 NOTE — Plan of Care (Signed)
  Problem: Education: Goal: Knowledge of General Education information will improve Description Including pain rating scale, medication(s)/side effects and non-pharmacologic comfort measures Outcome: Progressing   

## 2020-02-25 NOTE — Social Work (Addendum)
11:19am- CSW received the following message for pt from [facility]-  "he has too many resources to be eligible for Medicaid, I asked if he wanted to spend down, he doesn't want to at this time". Pt will continue to be uninsured unless he and his family elect to enroll in private insurance.  10:42am- CSW has sent secure email to [facility] to screen pt for Medicaid eligibility if they haven't already.   Westley Hummer, MSW, [place] [facility]

## 2020-02-26 ENCOUNTER — Ambulatory Visit
Admit: 2020-02-26 | Discharge: 2020-02-26 | Disposition: A | Discharge status: Home / Self Care | Payer: Self-pay | Attending: Radiation Oncology | Admitting: Radiation Oncology

## 2020-02-26 DIAGNOSIS — R748 Abnormal levels of other serum enzymes: Secondary | ICD-10-CM

## 2020-02-26 DIAGNOSIS — K5900 Constipation, unspecified: Secondary | ICD-10-CM

## 2020-02-26 DIAGNOSIS — I10 Essential (primary) hypertension: Secondary | ICD-10-CM

## 2020-02-26 DIAGNOSIS — E119 Type 2 diabetes mellitus without complications: Secondary | ICD-10-CM

## 2020-02-26 LAB — GLUCOSE, CAPILLARY
Glucose-Capillary: 150 mg/dL — ABNORMAL HIGH (ref 70–99)
Glucose-Capillary: 224 mg/dL — ABNORMAL HIGH (ref 70–99)
Glucose-Capillary: 302 mg/dL — ABNORMAL HIGH (ref 70–99)
Glucose-Capillary: 243 mg/dL — ABNORMAL HIGH (ref 70–99)

## 2020-02-26 MED ORDER — SENNOSIDES-DOCUSATE SODIUM 8.6-50 MG PO TABS
1.0000 | ORAL_TABLET | Freq: Two times a day (BID) | ORAL | Status: DC
  Administered 2020-02-26 – 2020-03-03 (×12): 1 via ORAL
  Filled 2020-02-26 (×13): qty 1

## 2020-02-26 MED ORDER — INSULIN GLARGINE 100 UNIT/ML ~~LOC~~ SOLN
5.0000 [IU] | Freq: Every day | SUBCUTANEOUS | Status: DC
  Administered 2020-02-26 – 2020-03-01 (×5): 5 [IU] via SUBCUTANEOUS
  Filled 2020-02-26 (×6): qty 0.05

## 2020-02-26 MED ORDER — CARVEDILOL 3.125 MG PO TABS
3.1250 mg | ORAL_TABLET | Freq: Two times a day (BID) | ORAL | Status: DC
  Administered 2020-02-26 – 2020-03-03 (×12): 3.125 mg via ORAL
  Filled 2020-02-26 (×12): qty 1

## 2020-02-26 MED ORDER — LIDOCAINE VISCOUS HCL 2 % MT SOLN
15.0000 mL | Freq: Four times a day (QID) | OROMUCOSAL | Status: DC | PRN
  Administered 2020-02-27 – 2020-02-28 (×2): 15 mL via OROMUCOSAL
  Filled 2020-02-26 (×3): qty 15

## 2020-02-26 NOTE — Progress Notes (Signed)
PROGRESS NOTE    Trevor Shelton  OFB:[id number] DOB: 1966-06-19 DOA: 02/14/2020 PCP: Patient, No Pcp Per    Chief Complaint  Patient presents with   Weakness   Abdominal Pain    Brief Narrative:   Chief Complaint: Generalized weakness, left rib pain, back pain since 2 weeks.  HPI: Trevor Shelton is a 54 y.o. male with no significant past medical history presents to emergency department with generalized weakness, left rib pain, lower back pain, constipation since 1 month.  Patient does not speak [place].  Patient's son at the bedside at the historian.  He tells me that patient fell off of a ladder from a height of around 4 feet 1 month ago and landed on his left side and since then he started having back pain, left rib pain, and weakness in bilateral lower extremities which is getting worse.  He came to ER on 7/20.  Imaging and UA came back negative for acute findings therefore patient discharged home on prednisone and Norco.  He was also evaluated by orthopedic outpatient and was administered 2 injections and prescribed a medication? with no help.  Reports severe pain in his back with burning sensation, left-sided rib pain, aggravates with deep inspiration.  Reports constipation and has noted decreased urinary output as well.  Reports progressive lower extremity weakness however denies fever, chills, urinary or bowel incontinence.  No history of headache, blurry vision, lightheadedness, dizziness, loss of consciousness, syncope, chest pain, shortness of breath, palpitation, leg swelling, dysuria, hematuria, sleep or weight changes.  No history of smoking, alcohol, listed drug use.   ED Course: Upon arrival to ED: Patient's vital signs are stable, CBC shows WBC of 13.3, UA negative for infection, CMP shows sodium of 125, severe AKI, calcium more than 15, lipase, TSH: WNL, COVID-19 and PTH: Pending.  CT abdomen pelvis shows diffuse lytic lesions throughout the skeleton consistent with  multiple myeloma or metastatic disease.  Pathologic fractures at T5 and T9, acute fracture of left rib 9 and 10.  He received IV fluid and Dilaudid in ED.  EDP consulted neurosurgery and oncology.  Oncology recommended to check SPEP and UPEP.  [facility] hospitalist consulted for admission for hypercalcemia/AKI?  Multiple myeloma.  Interval history: He received renal replacement therapy, renal function improving with good urine output, nephrology signed off Hypercalcemia corrected Received urgent radiation therapy due to impending cord compression and painful meds diastatic lesions in the spine and ribs.  Subjective:  They do not speak English, translation service used, wife at bedside  C/o throat is sore, denies trouble swallowing , no cough today, denies chest pain, denies short of breath, no hypoxia, no fever Denies back pain, complaining of bilateral rib pain, not able to lay on his side due to pain complaining bilateral lower leg weakness, denies bowel bladder incontinence  Good urine output, having bm  Assessment & Plan:   Principal Problem:   Hypercalcemia Active Problems:   AKI (acute kidney injury) ([place])   Pathologic fracture   Hyponatremia   Leukocytosis   Kappa light chain myeloma ([place])  Multiple myeloma with multiple spine and rib lesions, present with weakness, AKI and hypercalcemia -Elevated serum kappa free light chain at 37.5, elevated kappa lambda light chain ratio at 3.79 -CT-guided bone marrow biopsy done on August 13, pathology is positive, showed " Hypercellular bone marrow for age with plasma cell neoplasm" -Neurosurgery Dr. Marcello Moores consulted who does not feel neurosurgical intervention is necessary he recommended steroid and radiation treatment --started on palliative  radiotherapy to the thoracic spine from T5-9 and to treat the left 9th and 10th rib.  XRT started  urgently to hopefully prevent permanent paralysis -He is on dexamethasone 4 mg every 8 hours -Pain  control, bowel regimen -He continues to complain leg weakness , but appears able to lift lower extremity higher than before, continue to report bilateral rib pain -oncology plan to start chemo ASAP after finishing XRT, Appreciate oncology and radiation oncology input  Hemoptysis X1 on August 19 - no hypoxia, Hgb stable, no leukocytosis, no fever --cxr ". Dense left lower lobe consolidation, favor atelectasis. 2. Diffuse lytic lesions throughout the visualized bony structures, consistent with multiple myeloma." -Incentive spirometer ordered, increase activity as tolerated  Hypercalcemia -Calcium 15 on presentation, he received per medronate on August 10, received calcitonin x3 days on aug 9,10 and 11. -He received hydration -Calcium normalized  AKI -BUN 84 creatinine 4.92 on presentation,  -Received 2 episode of renal replacement therapy on August 11 and August 13 -Home medication lisinopril HCTZ held since admission -No further plan of renal replacement therapy or hemodialysis, nephrology signed off, outpatient nephrology follow-up arranged -Renal function improving BUN 53 creatinine 1.98 on 8/19 with good urine output -Continue monitor renal function, renal dosing medication  Hyponatremia Likely component of pseudohyponatremia in the setting of increased serum proteins Monitor  Mild elevated LFT -Negative hepatitis panel -CT abdomen pelvis hepatobiliary unremarkable -CK 49 -slowing improving  New diagnosis of diabetes -A1c 6.5 -A.m. fasting blood glucose increasing likely due to steroid -Start SSI -Change diet to carb modified diet -A.m. blood glucose 190,  start Lantus 5 units at night  Hypertension Currently on Norvasc, blood pressure remains elevated, start low-dose Coreg   Constipation: Having bowel movement , continue MiraLAX daily, senna  Body mass index is 28.78 kg/m.   DVT prophylaxis: heparin injection 5,000 Units Start: 02/19/20 1400 SCDs Start: 02/14/20  1817   Code Status: Full Family Communication: Wife at bedside Disposition:   Status is: Inpatient   Dispo: The patient is from: home              Anticipated d/c is to: CIR              Anticipated d/c date is: getting XRT, then CIR                Consultants:   Med onc Dr Nigel Bridgeman onc  IR  Nephrology  neurosurgery  Procedures:   CT-guided bone marrow biopsy on August 13  Tunneled dialysis catheter placement August 11  XRT  Antimicrobials:   None     Objective: Vitals:   02/25/20 0444 02/25/20 1627 02/25/20 2059 02/26/20 0541  BP: (!) 156/92 (!) 157/94 (!) 151/89 (!) 157/95  Pulse: 78 88 95 89  Resp: '18  18 18  ' Temp: 97.7 F (36.5 C) 98 F (36.7 C) 98.1 F (36.7 C) 98.8 F (37.1 C)  TempSrc: Oral Oral Oral Oral  SpO2: 98% 100% 99% 98%  Weight: 73.7 kg     Height:        Intake/Output Summary (Last 24 hours) at 02/26/2020 0717 Last data filed at 02/26/2020 0554 Gross per 24 hour  Intake --  Output 1600 ml  Net -1600 ml   Filed Weights   02/19/20 1630 02/22/20 0430 02/25/20 0444  Weight: 77.6 kg 83.8 kg 73.7 kg    Examination:  General exam: Anxious,  Respiratory system: Clear to auscultation. Respiratory effort normal. Cardiovascular system: S1 & S2 heard,  RRR. No JVD, no murmur, No pedal edema. Gastrointestinal system: Abdomen is nondistended, soft and nontender. No organomegaly or masses felt. Normal bowel sounds heard. Central nervous system: Alert and oriented. No focal neurological deficits. Extremities: Bilateral lower extremity weakness, able to lift against gravity at 60 degrees, some mild foot drop bilaterally, no edema Skin: No rashes, lesions or ulcers Psychiatry: Judgement and insight appear normal. Mood & affect appropriate.     Data Reviewed: I have personally reviewed following labs and imaging studies  CBC: Recent Labs  Lab 02/20/20 0411 02/21/20 0219 02/21/20 2131 02/23/20 0306 02/24/20 0422  WBC 7.9 7.2  8.4 10.1 9.3  NEUTROABS 6.9 6.1 7.6 8.8* 8.0*  HGB 9.9* 9.3* 9.4* 10.6* 10.6*  HCT 28.4* 27.5* 27.4* 30.6* 30.6*  MCV 85.3 85.7 84.8 86.0 84.5  PLT 188 194 190 213 712    Basic Metabolic Panel: Recent Labs  Lab 02/20/20 0411 02/20/20 0411 02/21/20 0219 02/21/20 2131 02/23/20 0306 02/24/20 0422 02/25/20 0340  NA 134*   < > 135 132* 133* 132* 130*  K 4.1   < > 4.1 4.7 4.5 4.6 4.9  CL 102   < > 105 103 104 102 100  CO2 24   < > 22 20* 21* 23 23  GLUCOSE 145*   < > 129* 161* 152* 183* 190*  BUN 33*   < > 52* 65* 63* 57* 53*  CREATININE 2.69*   < > 3.00* 2.86* 2.51* 2.24* 1.98*  CALCIUM 8.9   < > 9.1 9.1 9.5 9.5 9.5  MG 1.9  --  1.7 1.8 1.7 1.7  --   PHOS 3.0  --  3.5 3.0 3.5 3.5  --    < > = values in this interval not displayed.    GFR: Estimated Creatinine Clearance: 37.5 mL/min (A) (by C-G formula based on SCr of 1.98 mg/dL (H)).  Liver Function Tests: Recent Labs  Lab 02/21/20 0219 02/21/20 2131 02/23/20 0306 02/24/20 0422 02/25/20 0340  AST 44* 40 37 31 27  ALT 60* 57* 58* 56* 47*  ALKPHOS 64 80 96 109 111  BILITOT 0.6 0.7 0.5 0.4 0.5  PROT 5.3* 5.1* 5.5* 5.7* 5.7*  ALBUMIN 2.2* 2.2* 2.2* 2.4* 2.4*    CBG: Recent Labs  Lab 02/24/20 2125 02/25/20 0755 02/25/20 1151 02/25/20 1624 02/25/20 2100  GLUCAP 176* 125* 157* 134* 261*     No results found for this or any previous visit (from the past 240 hour(s)).       Radiology Studies: DG CHEST PORT 1 VIEW  Result Date: 02/25/2020 CLINICAL DATA:  Left-sided rib pain, back pain EXAM: PORTABLE CHEST 1 VIEW COMPARISON:  01/26/2020 FINDINGS: Single frontal view of the chest demonstrates a stable cardiac silhouette. Left lower lobe consolidation is seen within the retrocardiac region. No effusion or pneumothorax. Right internal jugular dialysis catheter tip overlies superior vena cava. Small lytic lesions are seen throughout the thoracic cage and left scapula. The lytic thoracic spine lesion seen on recent  CT are more difficult to appreciate on portable x-ray. IMPRESSION: 1. Dense left lower lobe consolidation, favor atelectasis. 2. Diffuse lytic lesions throughout the visualized bony structures, consistent with multiple myeloma. Electronically Signed   By: Randa Ngo M.D.   On: 02/25/2020 17:52        Scheduled Meds:  amLODipine  10 mg Oral Daily   Chlorhexidine Gluconate Cloth  6 each Topical Q0600   dexamethasone (DECADRON) injection  4 mg Intravenous Q8H   feeding supplement (  ENSURE ENLIVE)  237 mL Oral TID BM   heparin injection (subcutaneous)  5,000 Units Subcutaneous Q8H   insulin aspart  0-9 Units Subcutaneous TID WC   lidocaine  1 patch Transdermal Q24H   pantoprazole  40 mg Oral Daily   polyethylene glycol  17 g Oral Daily   sodium chloride flush  3 mL Intravenous Once   Continuous Infusions:    LOS: 12 days   Time spent: 35 mins Greater than 50% of this time was spent in counseling, explanation of diagnosis, planning of further management, and coordination of care.  I have personally reviewed and interpreted on  02/26/2020 daily labs, imagings as discussed above under date review session and assessment and plans.  I reviewed all nursing notes, pharmacy notes, consultant notes,  vitals, pertinent old records  I have discussed plan of care as described above with RN , patient and family on 02/26/2020  Voice Recognition /Dragon dictation system was used to create this note, attempts have been made to correct errors. Please contact the author with questions and/or clarifications.   Florencia Reasons, MD PhD FACP [facility]  Available via Epic secure chat 7am-7pm for nonurgent issues Please page for urgent issues To page the attending provider between 7A-7P or the covering provider during after hours 7P-7A, please log into the web site [email or web address] and access using universal [place] password for that web site. If you do not have the password, please call  the hospital operator.    02/26/2020, 7:17 AM

## 2020-02-26 NOTE — Progress Notes (Signed)
Pt transferred to [facility] for radiation therapy

## 2020-02-26 NOTE — Assessment & Plan Note (Signed)
-   continue bowel regimen  

## 2020-02-26 NOTE — Assessment & Plan Note (Addendum)
-   see MM - continue pain control - continue incentive spirometer

## 2020-02-26 NOTE — Assessment & Plan Note (Signed)
-   new diagnosis, A1c 6.5% - also on steroids at this time - continue SSI and CBG monitoring

## 2020-02-26 NOTE — Progress Notes (Signed)
Physical Therapy Treatment Patient Details Name: Trevor Shelton MRN: [id number] DOB: [birth year] Today's Date: 02/26/2020    History of Present Illness Pt is a 54 y.o. male admitted 02/14/20 with progressive BLE weakness, rib/back pain and constipation for one month since fall from ladder. Abdominal/pelvic CT shows diffuse lytic lesions throughout skeleton; pathologic fxs of T5 and T9, acute L rib 9-10 fxs. Also with AKI, hypercalcemia. Per neuro, pt with likely multiple myeloma and diffuse spinal involvement including high grade epidural disease at T6. Pt has begun palliative radiation tx while admitted. CT-guided bone marrow biopsy 8/13 with (+) pathology for "hypercellular bone marrow for age with plasma cell neoplasm." PMH includes HTN.    PT Comments     Session focused on BLE/functional strengthening, bed mobility, transfer training and sitting balance. Pt requiring two person moderate assist for bed mobility; utilization of Stedy to progress out of bed to provide knee block. Pt continues with gross weakness, decreased coordination, sensation impairments, and decreased endurance. Remains appropriate for CIR to address deficits and maximize functional mobility.    Follow Up Recommendations  CIR;Supervision/Assistance - 24 hour     Equipment Recommendations  Wheelchair (measurements PT);Wheelchair cushion (measurements PT);3in1 (PT)    Recommendations for Other Services       Precautions / Restrictions Precautions Precautions: Back;Fall;Other (comment) Precaution Comments: Significant BLE weakness and ataxia-like movements (LLE>RLE); back and rib precautions for comfort due to fxs Restrictions Weight Bearing Restrictions: No    Mobility  Bed Mobility Overal bed mobility: Needs Assistance Bed Mobility: Rolling;Sidelying to Sit Rolling: Mod assist Sidelying to sit: Mod assist;+2 for physical assistance       General bed mobility comments: assist to initiate roll to L side for both  UE/LE placement. Assist then needed to move legs off side of bed and at trunk to come into upright sitting. Bed pad then used to scoot hips to EOB for feet on floor. Pt with poor motor control of trunk and BLEs  Transfers Overall transfer level: Needs assistance Equipment used: Ambulation equipment used Transfers: Sit to/from Stand Sit to Stand: Max assist         General transfer comment: pt completing sit <> stands in stedy frame only from bed<>toilet<> recliner. Pt required assist to power up into standing and to attain upright posture. Very heavy reliance on knee block with stedy noted. Assist also needed to correct trunk and to position BLEs in appropriate place in stedy frame. Pt with poor control and sensation LLE >RLE  Ambulation/Gait                 Stairs             Wheelchair Mobility    Modified Rankin (Stroke Patients Only)       Balance Overall balance assessment: Needs assistance Sitting-balance support: Feet supported;No upper extremity supported Sitting balance-Leahy Scale: Poor Sitting balance - Comments: pt with tendency to utlize heavy BUE support. When asked to place hands on knees and sit unsupported pt needing min A- min guard with cues for appropriate posture. Pt able to engage in UE reaching activity sitting EOB, but remains highly guarded with min A when leaving BOS   Standing balance support: Bilateral upper extremity supported;During functional activity Standing balance-Leahy Scale: Zero Standing balance comment: reliant on BUE support, use of stedy, and external assist from therapist  Cognition Arousal/Alertness: Awake/alert Behavior During Therapy: WFL for tasks assessed/performed Overall Cognitive Status: Within Functional Limits for tasks assessed                                        Exercises General Exercises - Lower Extremity Quad Sets: Both;10 reps;Supine Heel Slides:  Both;10 reps;Supine Other Exercises Other Exercises: Truncal exercise: sitting EOB with min A support from OT while pt reaching with BUEs out of BOS x3    General Comments General comments (skin integrity, edema, etc.): In person interpreter Graciela used for session      Pertinent Vitals/Pain Pain Assessment: Faces Faces Pain Scale: Hurts little more Pain Location: ribs, back Pain Descriptors / Indicators: Grimacing Pain Intervention(s): Limited activity within patient's tolerance;Monitored during session    Home Living                      Prior Function            PT Goals (current goals can now be found in the care plan section) Acute Rehab PT Goals Patient Stated Goal: strengthen my legs PT Goal Formulation: With patient/family Time For Goal Achievement: 03/11/20 Potential to Achieve Goals: Fair    Frequency    Min 3X/week      PT Plan Current plan remains appropriate    Co-evaluation PT/OT/SLP Co-Evaluation/Treatment: Yes Reason for Co-Treatment: Complexity of the patient's impairments (multi-system involvement);For patient/therapist safety;To address functional/ADL transfers PT goals addressed during session: Mobility/safety with mobility OT goals addressed during session: ADL's and self-care;Proper use of Adaptive equipment and DME;Strengthening/ROM      AM-PAC PT "6 Clicks" Mobility   Outcome Measure  Help needed turning from your back to your side while in a flat bed without using bedrails?: A Lot Help needed moving from lying on your back to sitting on the side of a flat bed without using bedrails?: A Lot Help needed moving to and from a bed to a chair (including a wheelchair)?: Total Help needed standing up from a chair using your arms (e.g., wheelchair or bedside chair)?: Total Help needed to walk in hospital room?: Total Help needed climbing 3-5 steps with a railing? : Total 6 Click Score: 8    End of Session Equipment Utilized During  Treatment: Gait belt Activity Tolerance: Patient tolerated treatment well Patient left: in chair;with call bell/phone within reach;with chair alarm set;with family/visitor present Nurse Communication: Mobility status;Need for lift equipment PT Visit Diagnosis: Pain;Muscle weakness (generalized) (M62.81);Difficulty in walking, not elsewhere classified (R26.2);Other abnormalities of gait and mobility (R26.89)     Time: 4801-6553 PT Time Calculation (min) (ACUTE ONLY): 41 min  Charges:  $Therapeutic Activity: 8-22 mins                       Wyona Almas, PT, DPT Acute Rehabilitation Services Pager [phone] Office [phone]    Deno Etienne 02/26/2020, 11:06 AM

## 2020-02-26 NOTE — Assessment & Plan Note (Signed)
-   negative hepatitis panel; CT abd/pelvis unremarkable - continue monitoring

## 2020-02-26 NOTE — Assessment & Plan Note (Signed)
-   continue amlodipine and coreg

## 2020-02-26 NOTE — Assessment & Plan Note (Addendum)
-   low suspicion of infection; for now likely demargination in setting of steroid use - now resolved

## 2020-02-26 NOTE — Assessment & Plan Note (Signed)
-   considered pseudohypoNa 2/2 hyperproteinemia

## 2020-02-26 NOTE — Assessment & Plan Note (Addendum)
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

## 2020-02-26 NOTE — Progress Notes (Signed)
Occupational Therapy Treatment Patient Details Name: Trevor Shelton MRN: [id number] DOB: 05/12/1966 Today's Date: 02/26/2020    History of present illness Pt is a 54 y.o. male admitted 02/14/20 with progressive BLE weakness, rib/back pain and constipation for one month since fall from ladder. Abdominal/pelvic CT shows diffuse lytic lesions throughout skeleton; pathologic fxs of T5 and T9, acute L rib 9-10 fxs. Also with AKI, hypercalcemia. Per neuro, pt with likely multiple myeloma and diffuse spinal involvement including high grade epidural disease at T6. Pt has begun palliative radiation tx while admitted. CT-guided bone marrow biopsy 8/13 with (+) pathology for "hypercellular bone marrow for age with plasma cell neoplasm." PMH includes HTN.   OT comments  Pt progressing toward stated OT goals. Session focused on BADL mobility progression with use of stedy. Pt reports his main goal this session is to use his legs and have a BM. Pt completed bed mobility at mod A +2. Once EOB, focused on challenging core musculature with sitting unsupported and engaging in BUE dynamic reaching. Pt remains very guarded and requires min A to maintain unsupported sitting. Stedy frame then used to complete sit <> stand with max A. Pt was placed over toilet with BSC overtop for BM. Total A needed for posterior peri care. Continue to note increased BLE weakness (L>R) as well as poor coordination in trunk and generalized weakness in BUEs. Continue to recommend CIR. Will continue to follow per POC listed below.   Follow Up Recommendations  CIR    Equipment Recommendations  3 in 1 bedside commode;Wheelchair (measurements OT);Wheelchair cushion (measurements OT)    Recommendations for Other Services Rehab consult    Precautions / Restrictions Precautions Precautions: Back;Fall;Other (comment) Precaution Comments: Significant BLE weakness and ataxia-like movements (LLE>RLE); back and rib precautions for comfort due to  fxs Restrictions Weight Bearing Restrictions: No       Mobility Bed Mobility Overal bed mobility: Needs Assistance Bed Mobility: Rolling Rolling: Mod assist Sidelying to sit: Mod assist;+2 for physical assistance       General bed mobility comments: assist to initiate roll to L side for both UE/LE placement. Assist then needed to move legs off side of bed and at trunk to come into upright sitting. Bed pad then used to scoot hips to EOB for feet on floor. Pt with poor motor control of trunk and BLEs  Transfers Overall transfer level: Needs assistance Equipment used: Ambulation equipment used Transfers: Sit to/from Stand Sit to Stand: Max assist         General transfer comment: pt completing sit <> stands in stedy frame only from bed<>toilet<> recliner. Pt required assist to power up into standing and to attain upright posture. Very heavy reliance on knee block with stedy noted. Assist also needed to correct trunk and to position BLEs in appropriate place in stedy frame. Pt with poor control and sensation LLE >RLE    Balance Overall balance assessment: Needs assistance Sitting-balance support: Feet supported;No upper extremity supported Sitting balance-Leahy Scale: Poor Sitting balance - Comments: pt with tendency to utlize heavy BUE support. When asked to place hands on knees and sit unsupported pt needing min A- min guard with cues for appropriate posture. Pt able to engage in UE reaching activity sitting EOB, but remains highly guarded with min A when leaving BOS   Standing balance support: Bilateral upper extremity supported;During functional activity Standing balance-Leahy Scale: Zero Standing balance comment: reliant on BUE support, use of stedy, and external assist from therapist                             ADL either performed or assessed with clinical judgement   ADL Overall ADL's : Needs assistance/impaired     Grooming: Moderate assistance Grooming  Details (indicate cue type and reason): pt requires external support to maintain unsupported sitting balance. He has tendency to heavily brace self with BUEs                 Toilet Transfer: Maximal assistance Toilet Transfer Details (indicate cue type and reason): max A for sit <> stand in stedy frame. Stedy then placed over toilet with BSC over top for added leverage Toileting- Clothing Manipulation and Hygiene: Total assistance;Sit to/from stand Toileting - Clothing Manipulation Details (indicate cue type and reason): pt requiring total A from OT for posterior peri care after BM while standing with BUE support in stedy frame     Functional mobility during ADLs: Maximal assistance (to sit <> stand in stedy) General ADL Comments: stedy used this session to promote BLE WB while engaging in BADL.     Vision Patient Visual Report: No change from baseline     Perception     Praxis      Cognition Arousal/Alertness: Awake/alert Behavior During Therapy: WFL for tasks assessed/performed Overall Cognitive Status: Within Functional Limits for tasks assessed                                          Exercises Other Exercises Other Exercises: Truncal exercise: sitting EOB with min A support from OT while pt reaching with BUEs out of BOS x3   Shoulder Instructions       General Comments In person interpreter Graciela used for session    Pertinent Vitals/ Pain       Pain Assessment: Faces Faces Pain Scale: Hurts little more Pain Location: ribs, back Pain Descriptors / Indicators: Grimacing Pain Intervention(s): Monitored during session;Repositioned;Other (comment) (educated on comfort movement techniques)  Home Living                                          Prior Functioning/Environment              Frequency  Min 2X/week        Progress Toward Goals  OT Goals(current goals can now be found in the care plan section)  Progress  towards OT goals: Progressing toward goals  Acute Rehab OT Goals Patient Stated Goal: strengthen my legs OT Goal Formulation: With patient Time For Goal Achievement: 03/01/20 Potential to Achieve Goals: Good  Plan Discharge plan remains appropriate    Co-evaluation    PT/OT/SLP Co-Evaluation/Treatment: Yes Reason for Co-Treatment: For patient/therapist safety;To address functional/ADL transfers   OT goals addressed during session: ADL's and self-care;Proper use of Adaptive equipment and DME;Strengthening/ROM      AM-PAC OT "6 Clicks" Daily Activity     Outcome Measure   Help from another person eating meals?: A Little Help from another person taking care of personal grooming?: A Lot Help from another person toileting, which includes using toliet, bedpan, or urinal?: Total Help from another person bathing (including washing, rinsing, drying)?: A Lot Help from another person to put on and taking off regular upper body clothing?: A Lot Help from another person to put on and taking off regular lower body clothing?: Total 6 Click Score: 11      End of Session Equipment Utilized During Treatment: Gait belt;Other (comment) (stedy)  OT Visit Diagnosis: Unsteadiness on feet (R26.81);Other abnormalities of gait and mobility (R26.89);Other symptoms and signs involving the nervous system (R29.898);Muscle weakness (generalized) (M62.81);Pain Pain - part of body:  (back, ribs)   Activity Tolerance Patient tolerated treatment well   Patient Left in chair;with call bell/phone within reach;with chair alarm set   Nurse Communication Mobility status;Need for lift equipment;Other (comment) (maxi move pad placed in chair per NT request)        Time: [phone] OT Time Calculation (min): 51 min  Charges: OT General Charges $OT Visit: 1 Visit OT Treatments $Self Care/Home Management : 23-37 mins  Zenovia Jarred, MSOT, OTR/L [facility] Office Number:  [phone] Pager: [phone]  Zenovia Jarred 02/26/2020, 10:06 AM

## 2020-02-26 NOTE — Hospital Course (Addendum)
Trevor Shelton is a 54 y.o. Hispanic male with no significant past medical history who presented to [facility] ER on 02/14/20 initially with generalized weakness, left rib pain, lower back pain, constipation for about 1 month. Patient had fallen off a ladder approximately 1 month prior to admission, landing on his left side which was then followed by back pain, rib pain, and bilateral lower extremity weakness/numbness/tingling.  He had previously been evaluated in the ER on 01/26/2020 after his fall, undergoing multiple x-rays to his spine and ribs.  There were no acute abnormalities appreciated on imaging and he was discharged home.  He had followed up also with orthopedics.   He again had presented back to the ER as noted above on 02/14/2020 with ongoing lower extremity weakness and pain throughout. He then underwent CT chest/abdomen/pelvis which then revealed multiple lytic lesions throughout the skeleton as well as acute left rib fractures involving ninth and 10th ribs. There was concern for multiple myeloma and workup was initiated along with oncology consultation. He also underwent CT-guided bone marrow biopsy/aspiration on 02/19/20.  SPEP did reveal an M spike. He was also followed by neurosurgery and has been initiated on Decadron.  Radiation oncology also has been involved and he has been started on XRT with plans to complete a 10-day course. Bone marrow biopsy did return positive for neoplasm kappa light chain consistent with multiple myeloma.  Plan is to initiate chemo once XRT is completed.  He also developed progressive renal failure and was followed by nephrology.  He was started on RRT due to uremic signs.  He was started on dialysis on 02/17/2020 and required 2 sessions (8/11, 8/13). Renal function then improved and he has not required further dialysis since.

## 2020-02-26 NOTE — Assessment & Plan Note (Addendum)
-   followed by nephrology - creat 4.94, BUN 84 on admission - received RRT while at [facility] x 2 on 8/11 and 8/13 - current creatinine <2, nephrology no longer following and UOP and renal function appear to have recovered - continue I&O as well as daily labs

## 2020-02-26 NOTE — Assessment & Plan Note (Addendum)
-   etiology due to underlying MM - also on bisphosphonate tx per oncology (pamidronate on 8/10) and calcitonin 8/9 - 8/11 - no further IVF at this time

## 2020-02-26 NOTE — Progress Notes (Signed)
I saw the patient following his 6/10 treatments to his T spine. His LLE remains weak, but to me his strength has increased slightly compared to prior to therapy beginning. He reports he is ready to start working with PT to try to be able to regain his physical movement. We will follow along with PT's recommendations.    Carola Rhine, PAC

## 2020-02-27 LAB — BASIC METABOLIC PANEL
Anion gap: 9 (ref 5–15)
BUN: 61 mg/dL — ABNORMAL HIGH (ref 6–20)
CO2: 20 mmol/L — ABNORMAL LOW (ref 22–32)
Calcium: 10 mg/dL (ref 8.9–10.3)
Chloride: 101 mmol/L (ref 98–111)
Creatinine, Ser: 1.83 mg/dL — ABNORMAL HIGH (ref 0.61–1.24)
GFR calc Af Amer: 47 mL/min — ABNORMAL LOW (ref >60)
GFR calc non Af Amer: 40 mL/min — ABNORMAL LOW (ref >60)
Glucose, Bld: 171 mg/dL — ABNORMAL HIGH (ref 70–99)
Potassium: 5.4 mmol/L — ABNORMAL HIGH (ref 3.5–5.1)
Sodium: 130 mmol/L — ABNORMAL LOW (ref 135–145)

## 2020-02-27 LAB — CBC WITH DIFFERENTIAL/PLATELET
Abs Immature Granulocytes: 0.59 10*3/uL — ABNORMAL HIGH (ref 0.00–0.07)
Basophils Absolute: 0 10*3/uL (ref 0.0–0.1)
Basophils Relative: 0 %
Eosinophils Absolute: 0 10*3/uL (ref 0.0–0.5)
Eosinophils Relative: 0 %
HCT: 31.9 % — ABNORMAL LOW (ref 39.0–52.0)
Hemoglobin: 11 g/dL — ABNORMAL LOW (ref 13.0–17.0)
Immature Granulocytes: 6 %
Lymphocytes Relative: 4 %
Lymphs Abs: 0.4 10*3/uL — ABNORMAL LOW (ref 0.7–4.0)
MCH: 29.8 pg (ref 26.0–34.0)
MCHC: 34.5 g/dL (ref 30.0–36.0)
MCV: 86.4 fL (ref 80.0–100.0)
Monocytes Absolute: 0.6 10*3/uL (ref 0.1–1.0)
Monocytes Relative: 6 %
Neutro Abs: 9 10*3/uL — ABNORMAL HIGH (ref 1.7–7.7)
Neutrophils Relative %: 84 %
Platelets: 207 10*3/uL (ref 150–400)
RBC: 3.69 MIL/uL — ABNORMAL LOW (ref 4.22–5.81)
RDW: 11.8 % (ref 11.5–15.5)
WBC: 10.6 10*3/uL — ABNORMAL HIGH (ref 4.0–10.5)
nRBC: 0 % (ref 0.0–0.2)

## 2020-02-27 LAB — GLUCOSE, CAPILLARY
Glucose-Capillary: 173 mg/dL — ABNORMAL HIGH (ref 70–99)
Glucose-Capillary: 205 mg/dL — ABNORMAL HIGH (ref 70–99)
Glucose-Capillary: 221 mg/dL — ABNORMAL HIGH (ref 70–99)
Glucose-Capillary: 235 mg/dL — ABNORMAL HIGH (ref 70–99)

## 2020-02-27 LAB — HEPATIC FUNCTION PANEL
ALT: 47 U/L — ABNORMAL HIGH (ref 0–44)
AST: 30 U/L (ref 15–41)
Albumin: 3 g/dL — ABNORMAL LOW (ref 3.5–5.0)
Alkaline Phosphatase: 130 U/L — ABNORMAL HIGH (ref 38–126)
Bilirubin, Direct: <0.1 mg/dL (ref 0.0–0.2)
Total Bilirubin: 0.5 mg/dL (ref 0.3–1.2)
Total Protein: 6.8 g/dL (ref 6.5–8.1)

## 2020-02-27 LAB — MAGNESIUM: Magnesium: 1.8 mg/dL (ref 1.7–2.4)

## 2020-02-27 MED ORDER — CHLORPROMAZINE HCL 25 MG PO TABS
25.0000 mg | ORAL_TABLET | Freq: Four times a day (QID) | ORAL | Status: DC | PRN
  Administered 2020-02-27 – 2020-03-03 (×7): 25 mg via ORAL
  Filled 2020-02-27 (×8): qty 1

## 2020-02-27 NOTE — Progress Notes (Signed)
PROGRESS NOTE    Trevor Shelton   QKM:[id number]  DOB: 02/26/1966  DOA: 02/14/2020     13  PCP: Patient, No Pcp Per  CC: fall at home, back pain  Hospital Course: Trevor Shelton is a 54 y.o. Hispanic male with no significant past medical history who presented to [facility] ER on 02/14/20 initially with generalized weakness, left rib pain, lower back pain, constipation for about 1 month. Patient had fallen off a ladder approximately 1 month prior to admission, landing on his left side which was then followed by back pain, rib pain, and bilateral lower extremity weakness/numbness/tingling.  He had previously been evaluated in the ER on 01/26/2020 after his fall, undergoing multiple x-rays to his spine and ribs.  There were no acute abnormalities appreciated on imaging and he was discharged home.  He had followed up also with orthopedics.   He again had presented back to the ER as noted above on 02/14/2020 with ongoing lower extremity weakness and pain throughout. He then underwent CT chest/abdomen/pelvis which then revealed multiple lytic lesions throughout the skeleton as well as acute left rib fractures involving ninth and 10th ribs. There was concern for multiple myeloma and workup was initiated along with oncology consultation. He also underwent CT-guided bone marrow biopsy/aspiration on 02/19/20.  SPEP did reveal an M spike. He was also followed by neurosurgery and has been initiated on Decadron.  Radiation oncology also has been involved and he has been started on XRT with plans to complete a 10-day course. Bone marrow biopsy did return positive for neoplasm kappa light chain consistent with multiple myeloma.  Plan is to initiate chemo once XRT is completed.  He also developed progressive renal failure and was followed by nephrology.  He was started on RRT due to uremic signs.  He was started on dialysis on 02/17/2020 and required 2 sessions (8/11, 8/13). Renal function then improved and he has not required  further dialysis since.    Interval History:  Transferred from [facility] yesterday.  No events overnight.  His son is present bedside this morning and was updated.  All questions answered from patient and son.  Understands plan is to complete XRT, then start chemo while in hospital.  Old records reviewed in assessment of this patient  ROS: Constitutional: negative for chills and fevers, Respiratory: negative for asthma and dyspnea on exertion, Cardiovascular: negative for chest pain and Gastrointestinal: negative for abdominal pain  Assessment & Plan: Hypercalcemia - etiology due to underlying MM - also on bisphosphonate tx per oncology (pamidronate on 8/10) and calcitonin 8/9 - 8/11 - no further IVF at this time  AKI (acute kidney injury) ([place]) - followed by nephrology - creat 4.94, BUN 84 on admission - received RRT while at [facility] x 2 on 8/11 and 8/13 - current creatinine <2, nephrology no longer following and UOP and renal function appear to have recovered - continue I&O as well as daily labs  Kappa light chain myeloma ([place]) - multiple spine lytic lesions on CT and MRI; left rib fx 9 and 10th rib; cord compression - continue XRT per rad onc; underwent treatment on 8/20 on arrival to [facility] - SPEP shows M-spike and bone marrow bx diagnostic on 8/13 with kappa light chain neoplasm - oncology and rad onc following; NSG also followed at [facility], continue decadron (no surgical intervention at this time) - started on XRT T5-T9, Left 9/10 ribs - continue decadron 4 mg q8h per NSG - plan is for chemo immediately following  10 days XRT (day 7 was 02/26/20, no XRT over weekend it seems)  Pathologic fracture - see MM - continue pain control - continue incentive spirometer   Hyponatremia - considered pseudohypoNa 2/2 hyperproteinemia  Leukocytosis - low suspicion of infection; for now likely demargination in setting of steroid use  Elevated liver enzymes - negative hepatitis panel; CT  abd/pelvis unremarkable - continue monitoring   Diabetes mellitus ([place]) - new diagnosis, A1c 6.5% - also on steroids at this time - continue SSI and CBG monitoring  Hypertension - continue amlodipine and coreg  Constipation - continue bowel regimen    Antimicrobials: none  DVT prophylaxis: HSQ Code Status: Full Family Communication: son bedside Disposition Plan: CIR Status is: Inpatient  Remains inpatient appropriate because:Unsafe d/c plan, IV treatments appropriate due to intensity of illness or inability to take PO and Inpatient level of care appropriate due to severity of illness   Dispo: The patient is from: Home              Anticipated d/c is to: CIR              Anticipated d/c date is: > 3 days              Patient currently is not medically stable to d/c.       Objective: Blood pressure 134/84, pulse 84, temperature 97.6 F (36.4 C), temperature source Oral, resp. rate 18, height _0  (1.6 m), weight 73.7 kg, SpO2 97 %.  Examination: General appearance: alert, cooperative and no distress Head: Normocephalic, without obvious abnormality, atraumatic Eyes: EOMI Lungs: clear to auscultation bilaterally Heart: regular rate and rhythm and S1, S2 normal Abdomen: normal findings: bowel sounds normal and soft, non-tender Extremities: no edema Skin: Skin color, texture, turgor normal. No rashes or lesions Neurologic: sensation intact, decreased strength in LLE (4/5)  Consultants:   Oncology  Rad onc  Procedures:  Data Reviewed: I have personally reviewed following labs and imaging studies Results for orders placed or performed during the hospital encounter of 02/14/20 (from the past 24 hour(s))  Glucose, capillary     Status: Abnormal   Collection Time: 02/26/20  5:06 PM  Result Value Ref Range   Glucose-Capillary 302 (H) 70 - 99 mg/dL  Glucose, capillary     Status: Abnormal   Collection Time: 02/26/20  8:06 PM  Result Value Ref Range    Glucose-Capillary 243 (H) 70 - 99 mg/dL   Comment 1 Notify RN    Comment 2 Document in Chart   Basic metabolic panel     Status: Abnormal   Collection Time: 02/27/20  6:39 AM  Result Value Ref Range   Sodium 130 (L) 135 - 145 mmol/L   Potassium 5.4 (H) 3.5 - 5.1 mmol/L   Chloride 101 98 - 111 mmol/L   CO2 20 (L) 22 - 32 mmol/L   Glucose, Bld 171 (H) 70 - 99 mg/dL   BUN 61 (H) 6 - 20 mg/dL   Creatinine, Ser 1.83 (H) 0.61 - 1.24 mg/dL   Calcium 10.0 8.9 - 10.3 mg/dL   GFR calc non Af Amer 40 (L) >60 mL/min   GFR calc Af Amer 47 (L) >60 mL/min   Anion gap 9 5 - 15  Hepatic function panel     Status: Abnormal   Collection Time: 02/27/20  6:39 AM  Result Value Ref Range   Total Protein 6.8 6.5 - 8.1 g/dL   Albumin 3.0 (L) 3.5 - 5.0 g/dL  AST 30 15 - 41 U/L   ALT 47 (H) 0 - 44 U/L   Alkaline Phosphatase 130 (H) 38 - 126 U/L   Total Bilirubin 0.5 0.3 - 1.2 mg/dL   Bilirubin, Direct <0.1 0.0 - 0.2 mg/dL   Indirect Bilirubin NOT CALCULATED 0.3 - 0.9 mg/dL  CBC with Differential/Platelet     Status: Abnormal   Collection Time: 02/27/20  6:39 AM  Result Value Ref Range   WBC 10.6 (H) 4.0 - 10.5 K/uL   RBC 3.69 (L) 4.22 - 5.81 MIL/uL   Hemoglobin 11.0 (L) 13.0 - 17.0 g/dL   HCT 31.9 (L) 39 - 52 %   MCV 86.4 80.0 - 100.0 fL   MCH 29.8 26.0 - 34.0 pg   MCHC 34.5 30.0 - 36.0 g/dL   RDW 11.8 11.5 - 15.5 %   Platelets 207 150 - 400 K/uL   nRBC 0.0 0.0 - 0.2 %   Neutrophils Relative % 84 %   Neutro Abs 9.0 (H) 1.7 - 7.7 K/uL   Lymphocytes Relative 4 %   Lymphs Abs 0.4 (L) 0.7 - 4.0 K/uL   Monocytes Relative 6 %   Monocytes Absolute 0.6 0 - 1 K/uL   Eosinophils Relative 0 %   Eosinophils Absolute 0.0 0 - 0 K/uL   Basophils Relative 0 %   Basophils Absolute 0.0 0 - 0 K/uL   Immature Granulocytes 6 %   Abs Immature Granulocytes 0.59 (H) 0.00 - 0.07 K/uL  Magnesium     Status: None   Collection Time: 02/27/20  6:39 AM  Result Value Ref Range   Magnesium 1.8 1.7 - 2.4 mg/dL    Glucose, capillary     Status: Abnormal   Collection Time: 02/27/20  7:49 AM  Result Value Ref Range   Glucose-Capillary 173 (H) 70 - 99 mg/dL  Glucose, capillary     Status: Abnormal   Collection Time: 02/27/20 12:16 PM  Result Value Ref Range   Glucose-Capillary 205 (H) 70 - 99 mg/dL    No results found for this or any previous visit (from the past 240 hour(s)).   Radiology Studies: DG CHEST PORT 1 VIEW  Result Date: 02/25/2020 CLINICAL DATA:  Left-sided rib pain, back pain EXAM: PORTABLE CHEST 1 VIEW COMPARISON:  01/26/2020 FINDINGS: Single frontal view of the chest demonstrates a stable cardiac silhouette. Left lower lobe consolidation is seen within the retrocardiac region. No effusion or pneumothorax. Right internal jugular dialysis catheter tip overlies superior vena cava. Small lytic lesions are seen throughout the thoracic cage and left scapula. The lytic thoracic spine lesion seen on recent CT are more difficult to appreciate on portable x-ray. IMPRESSION: 1. Dense left lower lobe consolidation, favor atelectasis. 2. Diffuse lytic lesions throughout the visualized bony structures, consistent with multiple myeloma. Electronically Signed   By: Randa Ngo M.D.   On: 02/25/2020 17:52   DG CHEST PORT 1 VIEW  Final Result    CT BONE MARROW BIOPSY & ASPIRATION  Final Result    IR Fluoro Guide CV Line Right  Final Result    IR US Guide Vasc Access Right  Final Result    MR THORACIC SPINE W WO CONTRAST  Final Result    CT Chest Wo Contrast  Final Result    CT ABDOMEN PELVIS WO CONTRAST  Final Result       Scheduled Meds: . amLODipine  10 mg Oral Daily  . carvedilol  3.125 mg Oral BID WC  . Chlorhexidine  Gluconate Cloth  6 each Topical [id number]  . dexamethasone (DECADRON) injection  4 mg Intravenous Q8H  . feeding supplement (ENSURE ENLIVE)  237 mL Oral TID BM  . heparin injection (subcutaneous)  5,000 Units Subcutaneous Q8H  . insulin aspart  0-9 Units Subcutaneous  TID WC  . insulin glargine  5 Units Subcutaneous QHS  . lidocaine  1 patch Transdermal Q24H  . pantoprazole  40 mg Oral Daily  . polyethylene glycol  17 g Oral Daily  . senna-docusate  1 tablet Oral BID  . sodium chloride flush  3 mL Intravenous Once   PRN Meds: acetaminophen **OR** acetaminophen, bisacodyl, clonazePAM, hydrALAZINE, HYDROmorphone (DILAUDID) injection, lidocaine, ondansetron **OR** ondansetron (ZOFRAN) IV, oxyCODONE, phenol, polyethylene glycol, senna-docusate, simethicone Continuous Infusions:    LOS: 13 days  Time spent: Greater than 50% of the 35 minute visit was spent in counseling/coordination of care for the patient as laid out in the A&P.   Dwyane Dee, MD [facility] 02/27/2020, 2:07 PM   Contact via secure chat.  To contact the attending provider between 7A-7P or the covering provider during after hours 7P-7A, please log into the web site [email or web address] and access using universal [place] password for that web site. If you do not have the password, please call the hospital operator.

## 2020-02-28 DIAGNOSIS — K1379 Other lesions of oral mucosa: Secondary | ICD-10-CM

## 2020-02-28 LAB — CBC WITH DIFFERENTIAL/PLATELET
Abs Immature Granulocytes: 0.34 10*3/uL — ABNORMAL HIGH (ref 0.00–0.07)
Basophils Absolute: 0 10*3/uL (ref 0.0–0.1)
Basophils Relative: 0 %
Eosinophils Absolute: 0 10*3/uL (ref 0.0–0.5)
Eosinophils Relative: 0 %
HCT: 29.8 % — ABNORMAL LOW (ref 39.0–52.0)
Hemoglobin: 10.2 g/dL — ABNORMAL LOW (ref 13.0–17.0)
Immature Granulocytes: 5 %
Lymphocytes Relative: 4 %
Lymphs Abs: 0.3 10*3/uL — ABNORMAL LOW (ref 0.7–4.0)
MCH: 29.6 pg (ref 26.0–34.0)
MCHC: 34.2 g/dL (ref 30.0–36.0)
MCV: 86.4 fL (ref 80.0–100.0)
Monocytes Absolute: 0.4 10*3/uL (ref 0.1–1.0)
Monocytes Relative: 6 %
Neutro Abs: 6.3 10*3/uL (ref 1.7–7.7)
Neutrophils Relative %: 85 %
Platelets: 170 10*3/uL (ref 150–400)
RBC: 3.45 MIL/uL — ABNORMAL LOW (ref 4.22–5.81)
RDW: 11.8 % (ref 11.5–15.5)
WBC: 7.3 10*3/uL (ref 4.0–10.5)
nRBC: 0 % (ref 0.0–0.2)

## 2020-02-28 LAB — BASIC METABOLIC PANEL
Anion gap: 10 (ref 5–15)
BUN: 68 mg/dL — ABNORMAL HIGH (ref 6–20)
CO2: 21 mmol/L — ABNORMAL LOW (ref 22–32)
Calcium: 10.2 mg/dL (ref 8.9–10.3)
Chloride: 100 mmol/L (ref 98–111)
Creatinine, Ser: 1.92 mg/dL — ABNORMAL HIGH (ref 0.61–1.24)
GFR calc Af Amer: 44 mL/min — ABNORMAL LOW (ref >60)
GFR calc non Af Amer: 38 mL/min — ABNORMAL LOW (ref >60)
Glucose, Bld: 185 mg/dL — ABNORMAL HIGH (ref 70–99)
Potassium: 5.1 mmol/L (ref 3.5–5.1)
Sodium: 131 mmol/L — ABNORMAL LOW (ref 135–145)

## 2020-02-28 LAB — GLUCOSE, CAPILLARY
Glucose-Capillary: 160 mg/dL — ABNORMAL HIGH (ref 70–99)
Glucose-Capillary: 269 mg/dL — ABNORMAL HIGH (ref 70–99)
Glucose-Capillary: 309 mg/dL — ABNORMAL HIGH (ref 70–99)
Glucose-Capillary: 224 mg/dL — ABNORMAL HIGH (ref 70–99)

## 2020-02-28 LAB — MAGNESIUM: Magnesium: 1.9 mg/dL (ref 1.7–2.4)

## 2020-02-28 MED ORDER — MAGIC MOUTHWASH
10.0000 mL | Freq: Four times a day (QID) | ORAL | Status: AC
  Administered 2020-02-28 – 2020-03-01 (×6): 10 mL via ORAL
  Filled 2020-02-28 (×8): qty 10

## 2020-02-28 NOTE — Assessment & Plan Note (Addendum)
-   unclear significance/etiology; no obvious oral thrush, no odynophagia; no tender LAD; no edema - already on viscous lidocaine PRN -Trial of Magic mouthwash and monitor response; better - seems to have a localized tender LN on right anterior cervical chain

## 2020-02-28 NOTE — Progress Notes (Signed)
PROGRESS NOTE    Trevor Shelton   AYT:[id number]  DOB: 1966/01/18  DOA: 02/14/2020     14  PCP: Patient, No Pcp Per  CC: fall at home, back pain  Hospital Course: Trevor Shelton is a 54 y.o. Hispanic male with no significant past medical history who presented to [facility] ER on 02/14/20 initially with generalized weakness, left rib pain, lower back pain, constipation for about 1 month. Patient had fallen off a ladder approximately 1 month prior to admission, landing on his left side which was then followed by back pain, rib pain, and bilateral lower extremity weakness/numbness/tingling.  He had previously been evaluated in the ER on 01/26/2020 after his fall, undergoing multiple x-rays to his spine and ribs.  There were no acute abnormalities appreciated on imaging and he was discharged home.  He had followed up also with orthopedics.   He again had presented back to the ER as noted above on 02/14/2020 with ongoing lower extremity weakness and pain throughout. He then underwent CT chest/abdomen/pelvis which then revealed multiple lytic lesions throughout the skeleton as well as acute left rib fractures involving ninth and 10th ribs. There was concern for multiple myeloma and workup was initiated along with oncology consultation. He also underwent CT-guided bone marrow biopsy/aspiration on 02/19/20.  SPEP did reveal an M spike. He was also followed by neurosurgery and has been initiated on Decadron.  Radiation oncology also has been involved and he has been started on XRT with plans to complete a 10-day course. Bone marrow biopsy did return positive for neoplasm kappa light chain consistent with multiple myeloma.  Plan is to initiate chemo once XRT is completed.  He also developed progressive renal failure and was followed by nephrology.  He was started on RRT due to uremic signs.  He was started on dialysis on 02/17/2020 and required 2 sessions (8/11, 8/13). Renal function then improved and he has not required  further dialysis since.   Interval History:  Transferred from [facility].  No events overnight.  Wife present this morning.  Questions answered and update given. He is still having ongoing weakness in his lower extremities but left is worst compared to right. Oral intake is adequate and he is still making urine he says the same as he has been.  Old records reviewed in assessment of this patient  ROS: Constitutional: negative for chills and fevers, Respiratory: negative for asthma and dyspnea on exertion, Cardiovascular: negative for chest pain and Gastrointestinal: negative for abdominal pain  Assessment & Plan: Hypercalcemia - etiology due to underlying MM - also on bisphosphonate tx per oncology (pamidronate on 8/10) and calcitonin 8/9 - 8/11 - no further IVF at this time  AKI (acute kidney injury) ([facility]) - followed by nephrology - creat 4.94, BUN 84 on admission - received RRT while at [facility] x 2 on 8/11 and 8/13 - current creatinine <2, nephrology no longer following and UOP and renal function appear to have recovered - continue I&O as well as daily labs  Kappa light chain myeloma ([facility]) - multiple spine lytic lesions on CT and MRI; left rib fx 9 and 10th rib; cord compression - continue XRT per rad onc; underwent treatment on 8/20 on arrival to [facility] - SPEP shows M-spike and bone marrow bx diagnostic on 8/13 with kappa light chain neoplasm - oncology and rad onc following; NSG also followed at [facility], continue decadron (no surgical intervention at this time) - started on XRT T5-T9, Left 9/10 ribs - continue decadron  4 mg q8h per NSG - plan is for chemo immediately following 10 days XRT (day 7 was 02/26/20, no XRT over weekend it seems)  Pathologic fracture - see MM - continue pain control - continue incentive spirometer   Hyponatremia - considered pseudohypoNa 2/2 hyperproteinemia  Leukocytosis - low suspicion of infection; for now likely demargination in setting of steroid  use  Elevated liver enzymes - negative hepatitis panel; CT abd/pelvis unremarkable - continue monitoring   Diabetes mellitus ([place]) - new diagnosis, A1c 6.5% - also on steroids at this time - continue SSI and CBG monitoring  Hypertension - continue amlodipine and coreg  Constipation - continue bowel regimen   Mouth pain - unclear significance/etiology; no obvious oral thrush, no odynophagia; no tender LAD; no edema - already on viscous lidocaine PRN -Trial of Magic mouthwash and monitor response   Antimicrobials: none  DVT prophylaxis: HSQ Code Status: Full Family Communication: son bedside Disposition Plan: CIR Status is: Inpatient  Remains inpatient appropriate because:Unsafe d/c plan, IV treatments appropriate due to intensity of illness or inability to take PO and Inpatient level of care appropriate due to severity of illness   Dispo: The patient is from: Home              Anticipated d/c is to: CIR              Anticipated d/c date is: > 3 days              Patient currently is not medically stable to d/c.  Objective: Blood pressure 135/82, pulse 82, temperature 97.7 F (36.5 C), temperature source Oral, resp. rate 18, height $RemoveBe'5\' 3"'MhHNyxzzF$  (1.6 m), weight 73.7 kg, SpO2 96 %.  Examination: General appearance: alert, cooperative and no distress Head: Normocephalic, without obvious abnormality, atraumatic Eyes: EOMI Lungs: clear to auscultation bilaterally Heart: regular rate and rhythm and S1, S2 normal  Chest: Tenderness to palpation over left chest wall around area of rib fractures Abdomen: normal findings: bowel sounds normal and soft, non-tender Extremities: no edema Skin: Skin color, texture, turgor normal. No rashes or lesions Neurologic: sensation intact, decreased strength in LLE (4/5)  Consultants:   Oncology  Rad onc  Procedures:  Data Reviewed: I have personally reviewed following labs and imaging studies Results for orders placed or performed  during the hospital encounter of 02/14/20 (from the past 24 hour(s))  Glucose, capillary     Status: Abnormal   Collection Time: 02/27/20  4:42 PM  Result Value Ref Range   Glucose-Capillary 221 (H) 70 - 99 mg/dL  Glucose, capillary     Status: Abnormal   Collection Time: 02/27/20  8:49 PM  Result Value Ref Range   Glucose-Capillary 235 (H) 70 - 99 mg/dL   Comment 1 Notify RN    Comment 2 Document in Chart   Basic metabolic panel     Status: Abnormal   Collection Time: 02/28/20  5:18 AM  Result Value Ref Range   Sodium 131 (L) 135 - 145 mmol/L   Potassium 5.1 3.5 - 5.1 mmol/L   Chloride 100 98 - 111 mmol/L   CO2 21 (L) 22 - 32 mmol/L   Glucose, Bld 185 (H) 70 - 99 mg/dL   BUN 68 (H) 6 - 20 mg/dL   Creatinine, Ser 1.92 (H) 0.61 - 1.24 mg/dL   Calcium 10.2 8.9 - 10.3 mg/dL   GFR calc non Af Amer 38 (L) >60 mL/min   GFR calc Af Amer 44 (L) >60 mL/min  Anion gap 10 5 - 15  CBC with Differential/Platelet     Status: Abnormal   Collection Time: 02/28/20  5:18 AM  Result Value Ref Range   WBC 7.3 4.0 - 10.5 K/uL   RBC 3.45 (L) 4.22 - 5.81 MIL/uL   Hemoglobin 10.2 (L) 13.0 - 17.0 g/dL   HCT 29.8 (L) 39 - 52 %   MCV 86.4 80.0 - 100.0 fL   MCH 29.6 26.0 - 34.0 pg   MCHC 34.2 30.0 - 36.0 g/dL   RDW 11.8 11.5 - 15.5 %   Platelets 170 150 - 400 K/uL   nRBC 0.0 0.0 - 0.2 %   Neutrophils Relative % 85 %   Neutro Abs 6.3 1.7 - 7.7 K/uL   Lymphocytes Relative 4 %   Lymphs Abs 0.3 (L) 0.7 - 4.0 K/uL   Monocytes Relative 6 %   Monocytes Absolute 0.4 0 - 1 K/uL   Eosinophils Relative 0 %   Eosinophils Absolute 0.0 0 - 0 K/uL   Basophils Relative 0 %   Basophils Absolute 0.0 0 - 0 K/uL   Immature Granulocytes 5 %   Abs Immature Granulocytes 0.34 (H) 0.00 - 0.07 K/uL  Magnesium     Status: None   Collection Time: 02/28/20  5:18 AM  Result Value Ref Range   Magnesium 1.9 1.7 - 2.4 mg/dL  Glucose, capillary     Status: Abnormal   Collection Time: 02/28/20  7:58 AM  Result Value Ref  Range   Glucose-Capillary 160 (H) 70 - 99 mg/dL  Glucose, capillary     Status: Abnormal   Collection Time: 02/28/20 12:33 PM  Result Value Ref Range   Glucose-Capillary 269 (H) 70 - 99 mg/dL    No results found for this or any previous visit (from the past 240 hour(s)).   Radiology Studies: No results found. DG CHEST PORT 1 VIEW  Final Result    CT BONE MARROW BIOPSY & ASPIRATION  Final Result    IR Fluoro Guide CV Line Right  Final Result    IR US Guide Vasc Access Right  Final Result    MR THORACIC SPINE W WO CONTRAST  Final Result    CT Chest Wo Contrast  Final Result    CT ABDOMEN PELVIS WO CONTRAST  Final Result       Scheduled Meds: . amLODipine  10 mg Oral Daily  . carvedilol  3.125 mg Oral BID WC  . Chlorhexidine Gluconate Cloth  6 each Topical Q0600  . dexamethasone (DECADRON) injection  4 mg Intravenous Q8H  . feeding supplement (ENSURE ENLIVE)  237 mL Oral TID BM  . heparin injection (subcutaneous)  5,000 Units Subcutaneous Q8H  . insulin aspart  0-9 Units Subcutaneous TID WC  . insulin glargine  5 Units Subcutaneous QHS  . lidocaine  1 patch Transdermal Q24H  . magic mouthwash  10 mL Oral QID  . pantoprazole  40 mg Oral Daily  . polyethylene glycol  17 g Oral Daily  . senna-docusate  1 tablet Oral BID  . sodium chloride flush  3 mL Intravenous Once   PRN Meds: acetaminophen **OR** acetaminophen, bisacodyl, chlorproMAZINE, clonazePAM, hydrALAZINE, HYDROmorphone (DILAUDID) injection, lidocaine, ondansetron **OR** ondansetron (ZOFRAN) IV, oxyCODONE, phenol, polyethylene glycol, senna-docusate, simethicone Continuous Infusions:    LOS: 14 days  Time spent: Greater than 50% of the 35 minute visit was spent in counseling/coordination of care for the patient as laid out in the A&P.   Dwyane Dee, MD  [facility] 02/28/2020, 1:10 PM   Contact via secure chat.  To contact the attending provider between 7A-7P or the covering provider during  after hours 7P-7A, please log into the web site [email or web address] and access using universal [place] password for that web site. If you do not have the password, please call the hospital operator.

## 2020-02-29 ENCOUNTER — Ambulatory Visit
Admit: 2020-02-29 | Discharge: 2020-02-29 | Disposition: A | Discharge status: Home / Self Care | Payer: Self-pay | Attending: Radiation Oncology | Admitting: Radiation Oncology

## 2020-02-29 ENCOUNTER — Encounter: Payer: Self-pay | Admitting: Internal Medicine

## 2020-02-29 ENCOUNTER — Encounter (HOSPITAL_COMMUNITY): Payer: Self-pay | Admitting: Hematology and Oncology

## 2020-02-29 LAB — GLUCOSE, CAPILLARY
Glucose-Capillary: 179 mg/dL — ABNORMAL HIGH (ref 70–99)
Glucose-Capillary: 271 mg/dL — ABNORMAL HIGH (ref 70–99)
Glucose-Capillary: 305 mg/dL — ABNORMAL HIGH (ref 70–99)
Glucose-Capillary: 280 mg/dL — ABNORMAL HIGH (ref 70–99)

## 2020-02-29 LAB — BASIC METABOLIC PANEL
Anion gap: 9 (ref 5–15)
BUN: 66 mg/dL — ABNORMAL HIGH (ref 6–20)
CO2: 21 mmol/L — ABNORMAL LOW (ref 22–32)
Calcium: 10.8 mg/dL — ABNORMAL HIGH (ref 8.9–10.3)
Chloride: 99 mmol/L (ref 98–111)
Creatinine, Ser: 1.63 mg/dL — ABNORMAL HIGH (ref 0.61–1.24)
GFR calc Af Amer: 54 mL/min — ABNORMAL LOW (ref >60)
GFR calc non Af Amer: 46 mL/min — ABNORMAL LOW (ref >60)
Glucose, Bld: 174 mg/dL — ABNORMAL HIGH (ref 70–99)
Potassium: 5.4 mmol/L — ABNORMAL HIGH (ref 3.5–5.1)
Sodium: 129 mmol/L — ABNORMAL LOW (ref 135–145)

## 2020-02-29 LAB — CBC WITH DIFFERENTIAL/PLATELET
Abs Immature Granulocytes: 0.42 10*3/uL — ABNORMAL HIGH (ref 0.00–0.07)
Basophils Absolute: 0 10*3/uL (ref 0.0–0.1)
Basophils Relative: 0 %
Eosinophils Absolute: 0 10*3/uL (ref 0.0–0.5)
Eosinophils Relative: 0 %
HCT: 32.5 % — ABNORMAL LOW (ref 39.0–52.0)
Hemoglobin: 11.2 g/dL — ABNORMAL LOW (ref 13.0–17.0)
Immature Granulocytes: 4 %
Lymphocytes Relative: 2 %
Lymphs Abs: 0.2 10*3/uL — ABNORMAL LOW (ref 0.7–4.0)
MCH: 29.6 pg (ref 26.0–34.0)
MCHC: 34.5 g/dL (ref 30.0–36.0)
MCV: 86 fL (ref 80.0–100.0)
Monocytes Absolute: 0.4 10*3/uL (ref 0.1–1.0)
Monocytes Relative: 4 %
Neutro Abs: 8.4 10*3/uL — ABNORMAL HIGH (ref 1.7–7.7)
Neutrophils Relative %: 90 %
Platelets: 156 10*3/uL (ref 150–400)
RBC: 3.78 MIL/uL — ABNORMAL LOW (ref 4.22–5.81)
RDW: 11.9 % (ref 11.5–15.5)
WBC: 9.5 10*3/uL (ref 4.0–10.5)
nRBC: 0 % (ref 0.0–0.2)

## 2020-02-29 LAB — MAGNESIUM: Magnesium: 1.9 mg/dL (ref 1.7–2.4)

## 2020-02-29 NOTE — Progress Notes (Signed)
PROGRESS NOTE    Trevor Shelton   YCX:[id number]  DOB: June 19, 1966  DOA: 02/14/2020     15  PCP: Patient, No Pcp Per  CC: fall at home, back pain  Hospital Course: Trevor Shelton is a 54 y.o. Hispanic male with no significant past medical history who presented to [facility] ER on 02/14/20 initially with generalized weakness, left rib pain, lower back pain, constipation for about 1 month. Patient had fallen off a ladder approximately 1 month prior to admission, landing on his left side which was then followed by back pain, rib pain, and bilateral lower extremity weakness/numbness/tingling.  He had previously been evaluated in the ER on 01/26/2020 after his fall, undergoing multiple x-rays to his spine and ribs.  There were no acute abnormalities appreciated on imaging and he was discharged home.  He had followed up also with orthopedics.   He again had presented back to the ER as noted above on 02/14/2020 with ongoing lower extremity weakness and pain throughout. He then underwent CT chest/abdomen/pelvis which then revealed multiple lytic lesions throughout the skeleton as well as acute left rib fractures involving ninth and 10th ribs. There was concern for multiple myeloma and workup was initiated along with oncology consultation. He also underwent CT-guided bone marrow biopsy/aspiration on 02/19/20.  SPEP did reveal an M spike. He was also followed by neurosurgery and has been initiated on Decadron.  Radiation oncology also has been involved and he has been started on XRT with plans to complete a 10-day course. Bone marrow biopsy did return positive for neoplasm kappa light chain consistent with multiple myeloma.  Plan is to initiate chemo once XRT is completed.  He also developed progressive renal failure and was followed by nephrology.  He was started on RRT due to uremic signs.  He was started on dialysis on 02/17/2020 and required 2 sessions (8/11, 8/13). Renal function then improved and he has not required  further dialysis since.   Interval History:  Transferred from [employer] [facility].  No events overnight.  Wife also present this morning.  Interview again taken place with use of interpreter as always. He is still overall feeling okay; still endorses the ongoing pain in his left side, back, and his right neck.  No longer having pain in his mouth or with swallowing, just localizes it to the right side of his neck but there is no obvious palpable masses or significant tenderness.   Old records reviewed in assessment of this patient  ROS: Constitutional: negative for chills and fevers, Respiratory: negative for asthma and dyspnea on exertion, Cardiovascular: negative for chest pain and Gastrointestinal: negative for abdominal pain  Assessment & Plan: Hypercalcemia - etiology due to underlying MM - also on bisphosphonate tx per oncology (pamidronate on 8/10) and calcitonin 8/9 - 8/11 - no further IVF at this time  AKI (acute kidney injury) ([place]) - followed by nephrology - creat 4.94, BUN 84 on admission - received RRT while at [facility] x 2 on 8/11 and 8/13 - current creatinine <2, nephrology no longer following and UOP and renal function appear to have recovered - continue I&O as well as daily labs  Kappa light chain myeloma ([place]) - multiple spine lytic lesions on CT and MRI; left rib fx 9 and 10th rib; cord compression - continue XRT per rad onc; underwent treatment on 8/20 on arrival to [facility] - SPEP shows M-spike and bone marrow bx diagnostic on 8/13 with kappa light chain neoplasm - oncology and rad onc following; NSG  also followed at [facility], continue decadron (no surgical intervention at this time) - started on XRT T5-T9, Left 9/10 ribs - continue decadron 4 mg q8h per NSG - plan is for chemo immediately following 10 days XRT (day 7 was 02/26/20) - I think he may benefit from initiation of CyBorD regimen started inpatient prior to discharge to CIR but final decision per oncology  Pathologic  fracture - see MM - continue pain control - continue incentive spirometer   Hyponatremia - considered pseudohypoNa 2/2 hyperproteinemia  Leukocytosis - low suspicion of infection; for now likely demargination in setting of steroid use - now resolved  Elevated liver enzymes - negative hepatitis panel; CT abd/pelvis unremarkable - continue monitoring   Diabetes mellitus ([place]) - new diagnosis, A1c 6.5% - also on steroids at this time - continue SSI and CBG monitoring  Hypertension - continue amlodipine and coreg  Constipation - continue bowel regimen   Mouth pain - unclear significance/etiology; no obvious oral thrush, no odynophagia; no tender LAD; no edema - already on viscous lidocaine PRN -Trial of Magic mouthwash and monitor response; better - seems to have a localized tender LN on right anterior cervical chain   Antimicrobials: none  DVT prophylaxis: HSQ Code Status: Full Family Communication: son bedside Disposition Plan: CIR Status is: Inpatient  Remains inpatient appropriate because:Unsafe d/c plan, IV treatments appropriate due to intensity of illness or inability to take PO and Inpatient level of care appropriate due to severity of illness   Dispo: The patient is from: Home              Anticipated d/c is to: CIR              Anticipated d/c date is: > 3 days              Patient currently is not medically stable to d/c.  Objective: Blood pressure 137/86, pulse 82, temperature (!) 97.5 F (36.4 C), temperature source Oral, resp. rate 12, height '5\' 3"'  (1.6 m), weight 73.7 kg, SpO2 96 %.  Examination: General appearance: alert, cooperative and no distress Head: Normocephalic, without obvious abnormality, atraumatic  Neck: Very small palpable lymph node anterior cervical region that is minimally tender on exam Eyes: EOMI Lungs: clear to auscultation bilaterally Heart: regular rate and rhythm and S1, S2 Trevor  Chest: Tenderness to palpation over left  chest wall around area of rib fractures Abdomen: Trevor findings: bowel sounds Trevor and soft, non-tender Extremities: no edema Skin: Skin color, texture, turgor Trevor. No rashes or lesions Neurologic: sensation intact bilaterally lower extremity, decreased strength in LLE (4/5)  Consultants:   Oncology  Rad onc  Procedures:  Data Reviewed: I have personally reviewed following labs and imaging studies Results for orders placed or performed during the hospital encounter of 02/14/20 (from the past 24 hour(s))  Glucose, capillary     Status: Abnormal   Collection Time: 02/28/20  5:31 PM  Result Value Ref Range   Glucose-Capillary 309 (H) 70 - 99 mg/dL  Glucose, capillary     Status: Abnormal   Collection Time: 02/28/20  9:09 PM  Result Value Ref Range   Glucose-Capillary 224 (H) 70 - 99 mg/dL  Glucose, capillary     Status: Abnormal   Collection Time: 02/29/20  7:59 AM  Result Value Ref Range   Glucose-Capillary 179 (H) 70 - 99 mg/dL  Basic metabolic panel     Status: Abnormal   Collection Time: 02/29/20  9:27 AM  Result Value Ref  Range   Sodium 129 (L) 135 - 145 mmol/L   Potassium 5.4 (H) 3.5 - 5.1 mmol/L   Chloride 99 98 - 111 mmol/L   CO2 21 (L) 22 - 32 mmol/L   Glucose, Bld 174 (H) 70 - 99 mg/dL   BUN 66 (H) 6 - 20 mg/dL   Creatinine, Ser 1.63 (H) 0.61 - 1.24 mg/dL   Calcium 10.8 (H) 8.9 - 10.3 mg/dL   GFR calc non Af Amer 46 (L) >60 mL/min   GFR calc Af Amer 54 (L) >60 mL/min   Anion gap 9 5 - 15  CBC with Differential/Platelet     Status: Abnormal   Collection Time: 02/29/20  9:27 AM  Result Value Ref Range   WBC 9.5 4.0 - 10.5 K/uL   RBC 3.78 (L) 4.22 - 5.81 MIL/uL   Hemoglobin 11.2 (L) 13.0 - 17.0 g/dL   HCT 32.5 (L) 39 - 52 %   MCV 86.0 80.0 - 100.0 fL   MCH 29.6 26.0 - 34.0 pg   MCHC 34.5 30.0 - 36.0 g/dL   RDW 11.9 11.5 - 15.5 %   Platelets 156 150 - 400 K/uL   nRBC 0.0 0.0 - 0.2 %   Neutrophils Relative % 90 %   Neutro Abs 8.4 (H) 1.7 - 7.7 K/uL    Lymphocytes Relative 2 %   Lymphs Abs 0.2 (L) 0.7 - 4.0 K/uL   Monocytes Relative 4 %   Monocytes Absolute 0.4 0 - 1 K/uL   Eosinophils Relative 0 %   Eosinophils Absolute 0.0 0 - 0 K/uL   Basophils Relative 0 %   Basophils Absolute 0.0 0 - 0 K/uL   Immature Granulocytes 4 %   Abs Immature Granulocytes 0.42 (H) 0.00 - 0.07 K/uL  Magnesium     Status: None   Collection Time: 02/29/20  9:27 AM  Result Value Ref Range   Magnesium 1.9 1.7 - 2.4 mg/dL  Glucose, capillary     Status: Abnormal   Collection Time: 02/29/20 12:40 PM  Result Value Ref Range   Glucose-Capillary 271 (H) 70 - 99 mg/dL    No results found for this or any previous visit (from the past 240 hour(s)).   Radiology Studies: No results found. DG CHEST PORT 1 VIEW  Final Result    CT BONE MARROW BIOPSY & ASPIRATION  Final Result    IR Fluoro Guide CV Line Right  Final Result    IR US Guide Vasc Access Right  Final Result    MR THORACIC SPINE W WO CONTRAST  Final Result    CT Chest Wo Contrast  Final Result    CT ABDOMEN PELVIS WO CONTRAST  Final Result       Scheduled Meds:  amLODipine  10 mg Oral Daily   carvedilol  3.125 mg Oral BID WC   Chlorhexidine Gluconate Cloth  6 each Topical Q0600   dexamethasone (DECADRON) injection  4 mg Intravenous Q8H   feeding supplement (ENSURE ENLIVE)  237 mL Oral TID BM   heparin injection (subcutaneous)  5,000 Units Subcutaneous Q8H   insulin aspart  0-9 Units Subcutaneous TID WC   insulin glargine  5 Units Subcutaneous QHS   lidocaine  1 patch Transdermal Q24H   magic mouthwash  10 mL Oral QID   pantoprazole  40 mg Oral Daily   polyethylene glycol  17 g Oral Daily   senna-docusate  1 tablet Oral BID   sodium chloride flush  3 mL  Intravenous Once   PRN Meds: acetaminophen **OR** acetaminophen, bisacodyl, chlorproMAZINE, clonazePAM, hydrALAZINE, HYDROmorphone (DILAUDID) injection, lidocaine, ondansetron **OR** ondansetron (ZOFRAN) IV, oxyCODONE,  phenol, polyethylene glycol, senna-docusate, simethicone Continuous Infusions:    LOS: 15 days  Time spent: Greater than 50% of the 35 minute visit was spent in counseling/coordination of care for the patient as laid out in the A&P.   Dwyane Dee, MD [facility] 02/29/2020, 1:32 PM   Contact via secure chat.  To contact the attending provider between 7A-7P or the covering provider during after hours 7P-7A, please log into the web site [email or web address] and access using universal [place] password for that web site. If you do not have the password, please call the hospital operator.

## 2020-02-29 NOTE — TOC Initial Note (Signed)
Transition of Care [facility]) - Initial/Assessment Note    Patient Details  Name: Trevor Shelton MRN: [id number] Date of Birth: 1966-04-21  [facility] CM/SW Contact:    Shade Flood, LCSW Phone Number: 02/29/2020, 10:28 AM  Clinical Narrative:                  Pt [facility] from [facility] for treatment related to newly diagnosed multiple myeloma. Pt's primary language is [place].  Reviewed pt's record today and discussed pt status with MD in Progression Rounds. Pt lives with his wife in their home in [place]. He does not currently have insurance. At [facility], [employer] spoke with pt about Medicaid application and determined that at that time, pt would not be eligible unless he spent down some of his assets. TOC note from [facility] indicates pt stated he did not want to do that.  Per MD, pt is on day 7 or 8 of 10 day radiation treatment plan and pt will likely start chemotherapy treatment while inpatient status as well. PT/OT following pt and recommending CIR at dc.   TOC will follow and assist as needed.  Expected Discharge Plan: IP Rehab Facility Barriers to Discharge: Continued Medical Work up   Patient Goals and CMS Choice        Expected Discharge Plan and Services Expected Discharge Plan: [place] In-house Referral: Clinical Social Work   Post Acute Care Choice: IP Rehab Living arrangements for the past 2 months: Single Family Home                                      Prior Living Arrangements/Services Living arrangements for the past 2 months: Single Family Home Lives with:: Spouse Patient language and need for interpreter reviewed:: Yes Do you feel safe going back to the place where you live?: Yes      Need for Family Participation in Patient Care: Yes (Comment) Care giver support system in place?: Yes (comment)   Criminal Activity/Legal Involvement Pertinent to Current Situation/Hospitalization: No - Comment as needed  Activities  of Daily Living Home Assistive Devices/Equipment: None ADL Screening (condition at time of admission) Patient's cognitive ability adequate to safely complete daily activities?: Yes Is the patient deaf or have difficulty hearing?: No Does the patient have difficulty seeing, even when wearing glasses/contacts?: No Does the patient have difficulty concentrating, remembering, or making decisions?: No Patient able to express need for assistance with ADLs?: Yes Does the patient have difficulty dressing or bathing?: No Independently performs ADLs?: Yes (appropriate for developmental age) Does the patient have difficulty walking or climbing stairs?: Yes Weakness of Legs: Both Weakness of Arms/Hands: None  Permission Sought/Granted                  Emotional Assessment       Orientation: : Oriented to Self, Oriented to Place, Oriented to  Time, Oriented to Situation Alcohol / Substance Use: Not Applicable Psych Involvement: No (comment)  Admission diagnosis:  Hypercalcemia [E83.52] AKI (acute kidney injury) ([place]) [N17.9] 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 Mouth pain 02/28/2020  . Elevated liver enzymes 02/26/2020  .  Diabetes mellitus ([place]) 02/26/2020  . Hypertension   . Constipation   . Kappa light chain myeloma ([place]) 02/18/2020  . Hypercalcemia 02/14/2020  . AKI (acute kidney injury) ([place]) 02/14/2020  . Pathologic fracture 02/14/2020  . Hyponatremia 02/14/2020  . Leukocytosis 02/14/2020   PCP:  Patient, No Pcp Per Pharmacy:   [facility] ([place]), [place] - [address] [place] ([place] [address] Phone: [phone] Fax: [phone]     Social Determinants of Health (SDOH) Interventions    Readmission Risk Interventions Readmission Risk Prevention Plan 02/29/2020  Transportation Screening Complete  Medication Review Press photographer) Complete  Some recent data might be hidden

## 2020-02-29 NOTE — Progress Notes (Signed)
Inpatient Rehab Admissions Coordinator:   Met with patient and wife  at bedside to discuss potential CIR admission. RN, Lenna Sciara, provided translation services.  Pt. Stated interest; however, Pt. And wife are concerned about the cost because patient is uninsured. Pt.'s wife, Verdis Frederickson, states that someone came to see them today to discuss applying for Medicaid. This was likely Development worker, community. I will reach out to them tomorrow to see if they believe Pt. Is is likely to be approved for Medicaid. Will pursue for potential admit this week if the patient is agreeable to come and I have a bed available.   Clemens Catholic, [place], [place] Admissions Coordinator  [phone] ([place]) [phone] (office)

## 2020-02-29 NOTE — Progress Notes (Addendum)
[place] [phone]   [phone]  PROGRESS NOTE  Patient Care Team: Patient, No Pcp Per as PCP - General (General Practice)  Hematological/Oncological History # Lytic Lesions Throughout Skeleton/Hypercalcemia # Pathologic Fractures of Spine/Soft Tissue Masses in Spine #Concern for Multiple Myeloma 1) 02/14/2020: presented to the emergency department with markedly worsening back pain. 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 The T6 lesion severely narrows the thecal sac and deforms the spinal cord. 3) 02/18/2020: establish care with Dr. Lorenso Courier    HISTORY OF PRESENTING ILLNESS:  Trevor Shelton 54 y.o. male with medical history significant for HTN and HLD who presented with back pain and was found to have cord compression and lytic lesions concerning for multiple myeloma.   On review of the previous records progress he initially presented to the emergency department on 02/14/2020 with markedly worsening back pain. The pain has been going on for approximately 1 month's time when he initially presented. In the emergency department had a CT chest abdomen pelvis which showed lytic lesions throughout the skeleton consistent with multiple myeloma or metastatic disease. There were also signs of compression fracture of the spine. An MRI spine was ordered which showed epidural tumor extension at T6 and T8-9. The T6 lesion severely narrowed the thecal sac and deforms the spinal cord. The patient was referred to radiation oncology. In addition to radiation oncology, medical oncology was consulted for further evaluation and management as well.  The patient was seen today with the assistance of an interpreter. The patient is lying in bed and reports that he has ongoing pain particularly to his ribs.  States that he is not having any back  pain.  Reports that pain is slightly better compared with starting radiation.  Still with weakness and numbness in his left lower extremity. Nursing reports that he is unable to stand and walk.  Has been evaluated PT who is recommending CIR.  He is not having any fevers or chills.  Denies nausea, vomiting, constipation, diarrhea.  The remainder of a 10 point review of systems is noncontributory.  MEDICAL HISTORY:  Past Medical History:  Diagnosis Date  . AKI (acute kidney injury) ([place]) 02/2020  . Hypercalcemia 02/2020  . Hypercholesteremia   . Hypertension     SURGICAL HISTORY: Past Surgical History:  Procedure Laterality Date  . HERNIA REPAIR    . IR FLUORO GUIDE CV LINE RIGHT  02/17/2020  . IR US GUIDE VASC ACCESS RIGHT  02/17/2020    SOCIAL HISTORY: Social History   Socioeconomic History  . Marital status: Married    Spouse name: Not on file  . Number of children: Not on file  . Years of education: Not on file  . Highest education level: Not on file  Occupational History  . Not on file  Tobacco Use  . Smoking status: Never Smoker  . Smokeless tobacco: Never Used  Vaping Use  . Vaping Use: Never used  Substance and Sexual Activity  . Alcohol use: Not Currently  . Drug use: Not Currently  . Sexual activity: Not Currently  Other Topics Concern  . Not on file  Social History Narrative  . Not on file   Social Determinants of Health   Financial Resource Strain:   . Difficulty of Paying Living Expenses: Not on file  Food Insecurity:   . Worried About Charity fundraiser  in the Last Year: Not on file  . Ran Out of Food in the Last Year: Not on file  Transportation Needs:   . Lack of Transportation (Medical): Not on file  . Lack of Transportation (Non-Medical): Not on file  Physical Activity:   . Days of Exercise per Week: Not on file  . Minutes of Exercise per Session: Not on file  Stress:   . Feeling of Stress : Not on file  Social Connections:   . Frequency of  Communication with Friends and Family: Not on file  . Frequency of Social Gatherings with Friends and Family: Not on file  . Attends Religious Services: Not on file  . Active Member of Clubs or Organizations: Not on file  . Attends Archivist Meetings: Not on file  . Marital Status: Not on file  Intimate Partner Violence:   . Fear of Current or Ex-Partner: Not on file  . Emotionally Abused: Not on file  . Physically Abused: Not on file  . Sexually Abused: Not on file    FAMILY HISTORY: History reviewed. No pertinent family history.  ALLERGIES:  has No Known Allergies.  MEDICATIONS:  Current Facility-Administered Medications  Medication Dose Route Frequency Provider Last Rate Last Admin  . acetaminophen (TYLENOL) tablet 650 mg  650 mg Oral Q6H PRN Pahwani, Rinka R, MD   650 mg at 02/26/20 1538   Or  . acetaminophen (TYLENOL) suppository 650 mg  650 mg Rectal Q6H PRN Pahwani, Rinka R, MD      . amLODipine (NORVASC) tablet 10 mg  10 mg Oral Daily Reesa Chew, MD   10 mg at 02/29/20 0949  . bisacodyl (DULCOLAX) suppository 10 mg  10 mg Rectal Daily PRN Raiford Noble Latif, DO   10 mg at 02/18/20 1954  . carvedilol (COREG) tablet 3.125 mg  3.125 mg Oral BID WC Florencia Reasons, MD   3.125 mg at 02/29/20 0751  . Chlorhexidine Gluconate Cloth 2 % PADS 6 each  6 each Topical Q0600 Dwana Melena, MD   6 each at 02/29/20 1023  . chlorproMAZINE (THORAZINE) tablet 25 mg  25 mg Oral QID PRN Dwyane Dee, MD   25 mg at 02/28/20 2212  . clonazepam (KLONOPIN) disintegrating tablet 0.25 mg  0.25 mg Oral TID PRN Florencia Reasons, MD   0.25 mg at 02/25/20 2104  . dexamethasone (DECADRON) injection 4 mg  4 mg Intravenous Q8H Sheikh, Omair Hutchinson, DO   4 mg at 02/29/20 0516  . feeding supplement (ENSURE ENLIVE) (ENSURE ENLIVE) liquid 237 mL  237 mL Oral TID BM Sheikh, Omair Latif, DO   237 mL at 02/29/20 1024  . heparin injection 5,000 Units  5,000 Units Subcutaneous Q8H Allred, Darrell K, PA-C   5,000  Units at 02/29/20 0516  . hydrALAZINE (APRESOLINE) injection 10 mg  10 mg Intravenous Q6H PRN Raiford Noble Latif, DO   10 mg at 02/25/20 1718  . HYDROmorphone (DILAUDID) injection 0.5-1 mg  0.5-1 mg Intravenous Q2H PRN Pahwani, Rinka R, MD   1 mg at 02/29/20 0517  . insulin aspart (novoLOG) injection 0-9 Units  0-9 Units Subcutaneous TID WC Florencia Reasons, MD   2 Units at 02/29/20 [phone]  . insulin glargine (LANTUS) injection 5 Units  5 Units Subcutaneous QHS Florencia Reasons, MD   5 Units at 02/28/20 2214  . lidocaine (LIDODERM) 5 % 1 patch  1 patch Transdermal Q24H Nolberto Hanlon, MD   1 patch at 02/28/20 1326  . lidocaine (  XYLOCAINE) 2 % viscous mouth solution 15 mL  15 mL Mouth/Throat Q6H PRN Florencia Reasons, MD   15 mL at 02/28/20 0140  . magic mouthwash  10 mL Oral QID Dwyane Dee, MD   10 mL at 02/28/20 2215  . ondansetron (ZOFRAN) tablet 4 mg  4 mg Oral Q6H PRN Pahwani, Rinka R, MD   4 mg at 02/15/20 1616   Or  . ondansetron (ZOFRAN) injection 4 mg  4 mg Intravenous Q6H PRN Pahwani, Rinka R, MD   4 mg at 02/17/20 0020  . oxyCODONE (Oxy IR/ROXICODONE) immediate release tablet 5 mg  5 mg Oral Q4H PRN Pahwani, Rinka R, MD   5 mg at 02/29/20 0609  . pantoprazole (PROTONIX) EC tablet 40 mg  40 mg Oral Daily Steenwyk, Yujing Z, RPH   40 mg at 02/29/20 0949  . phenol (CHLORASEPTIC) mouth spray 1 spray  1 spray Mouth/Throat PRN Florencia Reasons, MD   1 spray at 02/25/20 [phone]  . polyethylene glycol (MIRALAX / GLYCOLAX) packet 17 g  17 g Oral Daily PRN Raiford Noble Latif, DO   17 g at 02/24/20 1456  . polyethylene glycol (MIRALAX / GLYCOLAX) packet 17 g  17 g Oral Daily Florencia Reasons, MD   17 g at 02/29/20 0951  . senna-docusate (Senokot-S) tablet 1 tablet  1 tablet Oral QHS PRN Raiford Noble Leander, DO   1 tablet at 02/28/20 2217  . senna-docusate (Senokot-S) tablet 1 tablet  1 tablet Oral BID Florencia Reasons, MD   1 tablet at 02/29/20 [phone]  . simethicone (MYLICON) chewable tablet 80 mg  80 mg Oral QID PRN Raiford Noble [place], DO   80 mg  at 02/28/20 2223  . sodium chloride flush (NS) 0.9 % injection 3 mL  3 mL Intravenous Once Virgel Manifold, MD        REVIEW OF SYSTEMS:   Constitutional: ( - ) fevers, ( - )  chills , ( - ) night sweats Eyes: ( - ) blurriness of vision, ( - ) double vision, ( - ) watery eyes Ears, nose, mouth, throat, and face: ( - ) mucositis, ( - ) sore throat Respiratory: ( - ) cough, ( - ) dyspnea, ( - ) wheezes Cardiovascular: ( - ) palpitation, ( - ) chest discomfort, ( - ) lower extremity swelling Gastrointestinal:  ( - ) nausea, ( - ) heartburn, ( - ) change in bowel habits Skin: ( - ) abnormal skin rashes Lymphatics: ( - ) new lymphadenopathy, ( - ) easy bruising Neurological: ( - ) numbness, ( - ) tingling, ( - ) new weaknesses Behavioral/Psych: ( - ) mood change, ( - ) new changes  All other systems were reviewed with the patient and are negative.  PHYSICAL EXAMINATION:  GENERAL: acutely ill appearing middle aged Hispanic male in NAD  SKIN: skin color, texture, turgor are normal, no rashes or significant lesions EYES: conjunctiva are pink and non-injected, sclera is injected LUNGS: clear to auscultation and percussion with normal breathing effort HEART: regular rate & rhythm and no murmurs and no lower extremity edema ABDOMEN: soft, non-tender, non-distended, normal bowel sounds Musculoskeletal: no cyanosis of digits and no clubbing.  5/5 strength in the right lower extremity and 3/5 strength in the left lower extremity. PSYCH: alert & oriented x 3, fluent speech NEURO: no focal motor/sensory deficits  LABORATORY DATA:  I have reviewed the data as listed CBC Latest Ref Rng & Units 02/29/2020 02/28/2020 02/27/2020  WBC 4.0 - 10.5  K/uL 9.5 7.3 10.6(H)  Hemoglobin 13.0 - 17.0 g/dL 11.2(L) 10.2(L) 11.0(L)  Hematocrit 39 - 52 % 32.5(L) 29.8(L) 31.9(L)  Platelets 150 - 400 K/uL 156 170 207    CMP Latest Ref Rng & Units 02/29/2020 02/28/2020 02/27/2020  Glucose 70 - 99 mg/dL 174(H) 185(H) 171(H)    BUN 6 - 20 mg/dL 66(H) 68(H) 61(H)  Creatinine 0.61 - 1.24 mg/dL 1.63(H) 1.92(H) 1.83(H)  Sodium 135 - 145 mmol/L 129(L) 131(L) 130(L)  Potassium 3.5 - 5.1 mmol/L 5.4(H) 5.1 5.4(H)  Chloride 98 - 111 mmol/L 99 100 101  CO2 22 - 32 mmol/L 21(L) 21(L) 20(L)  Calcium 8.9 - 10.3 mg/dL 10.8(H) 10.2 10.0  Total Protein 6.5 - 8.1 g/dL - - 6.8  Total Bilirubin 0.3 - 1.2 mg/dL - - 0.5  Alkaline Phos 38 - 126 U/L - - 130(H)  AST 15 - 41 U/L - - 30  ALT 0 - 44 U/L - - 47(H)     RADIOGRAPHIC STUDIES: I have personally reviewed the radiological images as listed and agreed with the findings in the report pronounced lytic lesions and cord compression in thoracic spine.   CT ABDOMEN PELVIS WO CONTRAST  Result Date: 02/14/2020 CLINICAL DATA:  Abdominal trauma. Trauma 1 month ago. Hypercalcemia. Fell 1 month ago. Abdominal pain, LOWER back pain, vomiting, diarrhea since the fall. EXAM: CT CHEST, ABDOMEN AND PELVIS WITHOUT CONTRAST TECHNIQUE: Multidetector CT imaging of the chest, abdomen and pelvis was performed following the standard protocol without IV contrast. COMPARISON:  None. FINDINGS: CT CHEST FINDINGS Cardiovascular: Heart is normal in size. Minimal atherosclerotic calcification of the coronary vessels. There is focal calcification of the ascending thoracic aorta, not associated with aneurysm. Normal noncontrast appearance of the pulmonary arteries. Mediastinum/Nodes: The visualized portion of the thyroid gland has a normal appearance. No significant mediastinal, hilar, or axillary adenopathy. Lungs/Pleura: There is subsegmental atelectasis at the lung bases. No suspicious pulmonary nodules. There is focal pleural thickening adjacent to LEFT anterior second rib lesion. Musculoskeletal: Diffuse lytic lesions are identified throughout the skeleton. Lesions vary in size from 2 millimeters to 1.8 centimeters. There is near complete replacement of the T5 vertebral body and loss of vertebral body height by  approximately 30%. A soft tissue component extends to the RIGHT of the vertebral body and extends into the canal, impinging on the spinal cord, image 25 of series 3. Soft tissue mass in this region is 4.2 x 3.2 centimeters. At T9, there is near complete replacement of the vertebral body with soft tissue component to the RIGHT of the vertebral body. Loss of vertebral body height is approximately 30%. Suspect impingement of the cord at this level. Image 38 of series 3. Large lytic lesion is identified within the T5 vertebral body, measuring 1.8 centimeters. This lesion extends to the RIGHT cortex 2. Acute fracture of LEFT ribs 9 and 10, possibly pathologic. There is a soft tissue component associated with lesions in the LEFT second rib. Soft tissue component measures 1.1 x 1.8 centimeters. CT ABDOMEN PELVIS FINDINGS Hepatobiliary: No focal liver abnormality is seen. No gallstones, gallbladder wall thickening, or biliary dilatation. Pancreas: Unremarkable. No pancreatic ductal dilatation or surrounding inflammatory changes. Spleen: Normal in size without focal abnormality. Adrenals/Urinary Tract: Adrenal glands are unremarkable. Kidneys are normal, without renal calculi, focal lesion, or hydronephrosis. Bladder is unremarkable. Stomach/Bowel: Stomach and small bowel loops are unremarkable. The appendix is well seen and has a normal appearance. Loops of colon are unremarkable. Average stool burden. Vascular/Lymphatic: No significant  vascular findings are present. No enlarged abdominal or pelvic lymph nodes. Reproductive: Prostate is mildly enlarged. There are prostatic calcifications. Other: Fat containing RIGHT inguinal hernia.  No ascites. Musculoskeletal: Diffuse lytic lesions throughout the pelvis, proximal femurs, and lumbar spine. IMPRESSION: 1. Diffuse lytic lesions throughout the skeleton, consistent with multiple myeloma or metastatic disease. Multiple myeloma is favored. 2. Pathologic fractures at T5 and T9.  These levels show significant soft tissue mass, and impingement upon the spinal cord. Recommend further evaluation with MRI of the thoracic spine with and without contrast. 3. Acute fractures of LEFT ribs 9 and 10, possibly pathologic. 4. No evidence for acute injury of the abdomen or pelvis. 5. Fat containing RIGHT inguinal hernia. 6. Mildly enlarged prostate. 7. Aortic Atherosclerosis (ICD10-I70.0). These results were called by telephone at the time of interpretation on 02/14/2020 at 4:06 pm to provider [facility] , who verbally acknowledged these results. Electronically Signed   By: Nolon Nations M.D.   On: 02/14/2020 16:07   CT Chest Wo Contrast  Result Date: 02/14/2020 CLINICAL DATA:  Abdominal trauma. Trauma 1 month ago. Hypercalcemia. Fell 1 month ago. Abdominal pain, LOWER back pain, vomiting, diarrhea since the fall. EXAM: CT CHEST, ABDOMEN AND PELVIS WITHOUT CONTRAST TECHNIQUE: Multidetector CT imaging of the chest, abdomen and pelvis was performed following the standard protocol without IV contrast. COMPARISON:  None. FINDINGS: CT CHEST FINDINGS Cardiovascular: Heart is normal in size. Minimal atherosclerotic calcification of the coronary vessels. There is focal calcification of the ascending thoracic aorta, not associated with aneurysm. Normal noncontrast appearance of the pulmonary arteries. Mediastinum/Nodes: The visualized portion of the thyroid gland has a normal appearance. No significant mediastinal, hilar, or axillary adenopathy. Lungs/Pleura: There is subsegmental atelectasis at the lung bases. No suspicious pulmonary nodules. There is focal pleural thickening adjacent to LEFT anterior second rib lesion. Musculoskeletal: Diffuse lytic lesions are identified throughout the skeleton. Lesions vary in size from 2 millimeters to 1.8 centimeters. There is near complete replacement of the T5 vertebral body and loss of vertebral body height by approximately 30%. A soft tissue component extends to the  RIGHT of the vertebral body and extends into the canal, impinging on the spinal cord, image 25 of series 3. Soft tissue mass in this region is 4.2 x 3.2 centimeters. At T9, there is near complete replacement of the vertebral body with soft tissue component to the RIGHT of the vertebral body. Loss of vertebral body height is approximately 30%. Suspect impingement of the cord at this level. Image 38 of series 3. Large lytic lesion is identified within the T5 vertebral body, measuring 1.8 centimeters. This lesion extends to the RIGHT cortex 2. Acute fracture of LEFT ribs 9 and 10, possibly pathologic. There is a soft tissue component associated with lesions in the LEFT second rib. Soft tissue component measures 1.1 x 1.8 centimeters. CT ABDOMEN PELVIS FINDINGS Hepatobiliary: No focal liver abnormality is seen. No gallstones, gallbladder wall thickening, or biliary dilatation. Pancreas: Unremarkable. No pancreatic ductal dilatation or surrounding inflammatory changes. Spleen: Normal in size without focal abnormality. Adrenals/Urinary Tract: Adrenal glands are unremarkable. Kidneys are normal, without renal calculi, focal lesion, or hydronephrosis. Bladder is unremarkable. Stomach/Bowel: Stomach and small bowel loops are unremarkable. The appendix is well seen and has a normal appearance. Loops of colon are unremarkable. Average stool burden. Vascular/Lymphatic: No significant vascular findings are present. No enlarged abdominal or pelvic lymph nodes. Reproductive: Prostate is mildly enlarged. There are prostatic calcifications. Other: Fat containing RIGHT inguinal hernia.  No  ascites. Musculoskeletal: Diffuse lytic lesions throughout the pelvis, proximal femurs, and lumbar spine. IMPRESSION: 1. Diffuse lytic lesions throughout the skeleton, consistent with multiple myeloma or metastatic disease. Multiple myeloma is favored. 2. Pathologic fractures at T5 and T9. These levels show significant soft tissue mass, and  impingement upon the spinal cord. Recommend further evaluation with MRI of the thoracic spine with and without contrast. 3. Acute fractures of LEFT ribs 9 and 10, possibly pathologic. 4. No evidence for acute injury of the abdomen or pelvis. 5. Fat containing RIGHT inguinal hernia. 6. Mildly enlarged prostate. 7. Aortic Atherosclerosis (ICD10-I70.0). These results were called by telephone at the time of interpretation on 02/14/2020 at 4:06 pm to provider [facility] , who verbally acknowledged these results. Electronically Signed   By: Nolon Nations M.D.   On: 02/14/2020 16:07   MR THORACIC SPINE W WO CONTRAST  Result Date: 02/14/2020 CLINICAL DATA:  Back pain. Compression fracture. Fever with nausea and vomiting. EXAM: MRI THORACIC WITHOUT AND WITH CONTRAST TECHNIQUE: Multiplanar and multiecho pulse sequences of the thoracic spine were obtained without and with intravenous contrast. CONTRAST:  11m GADAVIST GADOBUTROL 1 MMOL/ML IV SOLN COMPARISON:  None. FINDINGS: MRI THORACIC SPINE FINDINGS Alignment:  Normal Vertebrae: There are numerous bone marrow lesions throughout all visualized levels, but worst at T5, T6 and T9. There is compression deformity of T6 with 50% height loss and 5 mm of retropulsion. There is compression deformity of T9 with approximately 10% height loss and 3 mm of retropulsion. Cord: There is mild hyperintense T2-weighted signal in the spinal cord at T5. Paraspinal and other soft tissues: There is soft tissue mass is associated with the vertebral lesions at T6 and T9. Otherwise, the paraspinal soft tissues are unremarkable. Disc levels: T5-6: Contrast-enhancing mass replaces most of the vertebral body bone marrow and extends into the pedicles, transverse processes and the right paraspinal tissues. There is also epidural extension that severely narrows the thecal sac and causes mass effect on the spinal cord. The mass also severely narrows the right T5 neural foramen. T8-9: Soft tissue mass  replacing much of the bone marrow at T9 with epidural extension mildly narrowing the thecal sac with slight mass effect on the spinal cord. The mass extends into the right neural foramen. IMPRESSION: 1. Numerous bone marrow lesions throughout all visualized levels, but worst at T5, T6 and T9. 2. 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 The T6 lesion severely narrows the thecal sac and deforms the spinal cord. 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 3. Pathologic fractures of T6 and T9 with 3-5 mm of retropulsion. Electronically Signed   By: KUlyses JarredM.D.   On: 02/14/2020 19:14   IR Fluoro Guide CV Line Right  Result Date: 02/17/2020 CLINICAL DATA:  Renal failure and need for tunneled hemodialysis catheter to begin hemodialysis. EXAM: TUNNELED CENTRAL VENOUS HEMODIALYSIS CATHETER PLACEMENT WITH ULTRASOUND AND FLUOROSCOPIC GUIDANCE ANESTHESIA/SEDATION: 1.0 mg IV Versed; 50 mcg IV Fentanyl. Total Moderate Sedation Time:   23 minutes. The patient's level of consciousness and physiologic status were continuously monitored during the procedure by Radiology nursing. MEDICATIONS: 2 g IV Ancef. FLUOROSCOPY TIME:  48 seconds. 4.0 mGy. PROCEDURE: The procedure, risks, benefits, and alternatives were explained to the patient. Questions regarding the procedure were encouraged and answered. The patient understands and consents to the procedure. A timeout was performed prior to initiating the procedure. The right neck and chest were prepped with chlorhexidine  in a sterile fashion, and a sterile drape was applied covering the operative field. Maximum barrier sterile technique with sterile gowns and gloves were used for the procedure. Local anesthesia was provided with 1% lidocaine. Ultrasound was used to confirm patency of the right internal jugular vein. After creating a small venotomy incision, a 21 gauge needle was advanced into the right internal  jugular vein under direct, real-time ultrasound guidance. Ultrasound image documentation was performed. After securing guidewire access, an 8 Fr dilator was placed. A J-wire was kinked to measure appropriate catheter length. A Palindrome tunneled hemodialysis catheter measuring 19 cm from tip to cuff was chosen for placement. This was tunneled in a retrograde fashion from the chest wall to the venotomy incision. At the venotomy, serial dilatation was performed and a 15 Fr peel-away sheath was placed over a guidewire. The catheter was then placed through the sheath and the sheath removed. Final catheter positioning was confirmed and documented with a fluoroscopic spot image. The catheter was aspirated, flushed with saline, and injected with appropriate volume heparin dwells. The venotomy incision was closed with subcuticular 4-0 Vicryl. Dermabond was applied to the incision. The catheter exit site was secured with 0-Prolene retention sutures. COMPLICATIONS: None.  No pneumothorax. FINDINGS: After catheter placement, the tip lies in the right atrium. The catheter aspirates normally and is ready for immediate use. IMPRESSION: Placement of tunneled hemodialysis catheter via the right internal jugular vein. The catheter tip lies in the right atrium. The catheter is ready for immediate use. Electronically Signed   By: Aletta Edouard M.D.   On: 02/17/2020 17:22   IR US Guide Vasc Access Right  Result Date: 02/17/2020 CLINICAL DATA:  Renal failure and need for tunneled hemodialysis catheter to begin hemodialysis. EXAM: TUNNELED CENTRAL VENOUS HEMODIALYSIS CATHETER PLACEMENT WITH ULTRASOUND AND FLUOROSCOPIC GUIDANCE ANESTHESIA/SEDATION: 1.0 mg IV Versed; 50 mcg IV Fentanyl. Total Moderate Sedation Time:   23 minutes. The patient's level of consciousness and physiologic status were continuously monitored during the procedure by Radiology nursing. MEDICATIONS: 2 g IV Ancef. FLUOROSCOPY TIME:  48 seconds. 4.0 mGy.  PROCEDURE: The procedure, risks, benefits, and alternatives were explained to the patient. Questions regarding the procedure were encouraged and answered. The patient understands and consents to the procedure. A timeout was performed prior to initiating the procedure. The right neck and chest were prepped with chlorhexidine in a sterile fashion, and a sterile drape was applied covering the operative field. Maximum barrier sterile technique with sterile gowns and gloves were used for the procedure. Local anesthesia was provided with 1% lidocaine. Ultrasound was used to confirm patency of the right internal jugular vein. After creating a small venotomy incision, a 21 gauge needle was advanced into the right internal jugular vein under direct, real-time ultrasound guidance. Ultrasound image documentation was performed. After securing guidewire access, an 8 Fr dilator was placed. A J-wire was kinked to measure appropriate catheter length. A Palindrome tunneled hemodialysis catheter measuring 19 cm from tip to cuff was chosen for placement. This was tunneled in a retrograde fashion from the chest wall to the venotomy incision. At the venotomy, serial dilatation was performed and a 15 Fr peel-away sheath was placed over a guidewire. The catheter was then placed through the sheath and the sheath removed. Final catheter positioning was confirmed and documented with a fluoroscopic spot image. The catheter was aspirated, flushed with saline, and injected with appropriate volume heparin dwells. The venotomy incision was closed with subcuticular 4-0 Vicryl. Dermabond was applied to the  incision. The catheter exit site was secured with 0-Prolene retention sutures. COMPLICATIONS: None.  No pneumothorax. FINDINGS: After catheter placement, the tip lies in the right atrium. The catheter aspirates normally and is ready for immediate use. IMPRESSION: Placement of tunneled hemodialysis catheter via the right internal jugular vein. The  catheter tip lies in the right atrium. The catheter is ready for immediate use. Electronically Signed   By: Aletta Edouard M.D.   On: 02/17/2020 17:22   DG CHEST PORT 1 VIEW  Result Date: 02/25/2020 CLINICAL DATA:  Left-sided rib pain, back pain EXAM: PORTABLE CHEST 1 VIEW COMPARISON:  01/26/2020 FINDINGS: Single frontal view of the chest demonstrates a stable cardiac silhouette. Left lower lobe consolidation is seen within the retrocardiac region. No effusion or pneumothorax. Right internal jugular dialysis catheter tip overlies superior vena cava. Small lytic lesions are seen throughout the thoracic cage and left scapula. The lytic thoracic spine lesion seen on recent CT are more difficult to appreciate on portable x-ray. IMPRESSION: 1. Dense left lower lobe consolidation, favor atelectasis. 2. Diffuse lytic lesions throughout the visualized bony structures, consistent with multiple myeloma. Electronically Signed   By: Randa Ngo M.D.   On: 02/25/2020 17:52   CT BONE MARROW BIOPSY & ASPIRATION  Result Date: 02/19/2020 CLINICAL DATA:  Clinical suspicion multiple myeloma and need for bone marrow biopsy. EXAM: CT GUIDED BONE MARROW ASPIRATION AND BIOPSY ANESTHESIA/SEDATION: Versed 1.0 mg IV, Fentanyl 50 mcg IV Total Moderate Sedation Time:   14 minutes. The patient's level of consciousness and physiologic status were continuously monitored during the procedure by Radiology nursing. PROCEDURE: The procedure risks, benefits, and alternatives were explained to the patient. Questions regarding the procedure were encouraged and answered. The patient understands and consents to the procedure. A time out was performed prior to initiating the procedure. The right gluteal region was prepped with chlorhexidine. Sterile gown and sterile gloves were used for the procedure. Local anesthesia was provided with 1% Lidocaine. Under CT guidance, an 11 gauge On Control bone cutting needle was advanced from a posterior  approach into the right iliac bone. Needle positioning was confirmed with CT. Initial non heparinized and heparinized aspirate samples were obtained of bone marrow. Core biopsy was performed via the On Control drill needle. COMPLICATIONS: None FINDINGS: Inspection of initial aspirate did reveal visible particles. Intact core biopsy sample was obtained. IMPRESSION: CT guided bone marrow biopsy of right posterior iliac bone with both aspirate and core samples obtained. Electronically Signed   By: Aletta Edouard M.D.   On: 02/19/2020 12:21    ASSESSMENT & PLAN Trevor Shelton 54 y.o. male with medical history significant for HTN and HLD who presented with back pain and was found to have cord compression and lytic lesions concerning for multiple myeloma. The patient's imaging and blood work are most concerning for multiple myeloma.  Interestingly the M spike and the serum free light chain ratio is relatively modest compared to the extent of the damage seen on CT scan.  The patient had significant hypercalcemia upon presentation.  He is undergoing radiation for compression of the spinal cord.  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  Bone marrow biopsy was performed on 02/19/2020 and was consistent with a plasma cell neoplasm.  Cytogenetics were normal. Recommend CyBorD chemotherapy which can be administered in the outpatient setting once he is stabilized.  In the event it looks  like the patient is not going to have a timely discharge we could consider administration of cycle 1 while he is in house.  SPEP 02/15/2020: M protein 0.9 SFLC: kappa 37.5, Lambda 9.9, ratio 3.79 UPEP: 02/17/2020: M spike not observed.  Immunofixation showed IgG monoclonal protein with kappa light chain specificity   # Lytic Lesions Througout Skeleton/Hypercalcemia # Pathologic Fractures of Spine/Soft Tissue Masses  in Spine #Concern for Multiple Myeloma --bone marrow biopsy performed 02/19/2020 to confirm diagnosis; results consistent with plasma cell neoplasm.  Normal cytogenetics. --M protein and Kappa ratio are modestly elevated compared the the extent of damage seen on CT/MRI. 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  We will continue to closely monitor and consider additional bisphosphonate therapy as an outpatient. --Recommend for the patient to proceed with CyBorD chemotherapy. This can be administered in the outpatient setting once he was stabilized from a cord compression/hypercalcemia standpoint, however if it appears as though the patient is going to have an extended inpatient admission we can arrange for the first cycle to be administered in house.  Further discussion of systemic treatment options later today per Dr. Lorenso Courier. --Hematology will continue to follow while he is inpatient.   Mikey Bussing, DNP, AGPCNP-BC, AOCNP Mon/Tues/Thurs/Fri 7am-5pm; Off Wednesdays Cell: [phone]  02/29/2020 12:28 PM   I have read the above note and agree with the assessment and plan. Will have patient continued radiation therapy while in house and will start chemotherapy in the outpatient setting after discharge.   Ledell Peoples, MD Department of Hematology/Oncology [place] at [facility] Phone: [phone] Pager: [phone] Email: [email or web address]

## 2020-02-29 NOTE — Progress Notes (Signed)
Physical Therapy Treatment Patient Details Name: Trevor Shelton MRN: [id number] DOB: [birth year] Today's Date: 02/29/2020    History of Present Illness Pt is a 54 y.o. male admitted 02/14/20 with progressive BLE weakness, rib/back pain and constipation for one month since fall from ladder. Abdominal/pelvic CT shows diffuse lytic lesions throughout skeleton; pathologic fxs of T5 and T9, acute L rib 9-10 fxs. Also with AKI, hypercalcemia. Per neuro, pt with likely multiple myeloma and diffuse spinal involvement including high grade epidural disease at T6. Pt has begun palliative radiation tx while admitted. CT-guided bone marrow biopsy 8/13 with (+) pathology for "hypercellular bone marrow for age with plasma cell neoplasm." PMH includes HTN.    PT Comments    Pt assisted with performing LE exercises.  Lt LE weaker then Rt LE.  Pt sat EOB at least 20 while challenging balance, LE exercises and taking rest breaks.  Pt requested assist to use BSC.  Michaelyn Barter for safety with transfer.  Continue to recommend CIR upon d/c.   Follow Up Recommendations  CIR;Supervision/Assistance - 24 hour     Equipment Recommendations  Wheelchair (measurements PT);Wheelchair cushion (measurements PT);3in1 (PT)    Recommendations for Other Services       Precautions / Restrictions Precautions Precautions: Back;Fall;Other (comment) Precaution Comments: Significant BLE weakness and ataxia-like movements (LLE>RLE); back and rib precautions for comfort due to fxs    Mobility  Bed Mobility Overal bed mobility: Needs Assistance Bed Mobility: Rolling;Sidelying to Sit;Sit to Supine Rolling: Min assist Sidelying to sit: Mod assist   Sit to supine: Mod assist   General bed mobility comments: pt able to assist with rolling using upper body, assist for positioning LEs; assist for LEs over EOB and pt able to assist with trunk upright with hand assist from sidelying; pt returned to bed with assist for LEs (very fatigued  and returning to bed prior to cues for log roll technique so assisted with lower body)  Transfers Overall transfer level: Needs assistance   Transfers: Sit to/from Stand Sit to Stand: Max assist;+2 safety/equipment Stand pivot transfers: Total assist       General transfer comment: pt assisted with sit to stands within [place] for assist to/from toilet (requested BM on [facility]), pt reliant on knees blocked and assisted with hip extension  Ambulation/Gait                 Stairs             Wheelchair Mobility    Modified Rankin (Stroke Patients Only)       Balance Overall balance assessment: Needs assistance Sitting-balance support: Feet supported;Bilateral upper extremity supported Sitting balance-Leahy Scale: Poor Sitting balance - Comments: pt able to briefly sit with hand on lap however pain in ribs also limiting so required bil UE support, pt performed one UE support with other lifting to tap therapist's hand x10                                    Cognition Arousal/Alertness: Awake/alert Behavior During Therapy: WFL for tasks assessed/performed Overall Cognitive Status: Within Functional Limits for tasks assessed                                 General Comments: pt able to understand basic [place], RN assisted with transfer with [place] and speaks [place]  Exercises General Exercises - Lower Extremity Ankle Circles/Pumps: AROM;Both;Seated Quad Sets: Both;10 reps;Supine;AROM Long [employer]: Both;10 reps;Seated;AROM Heel Slides: Both;10 reps;Supine;AAROM;Limitations Heel Slides Limitations: left LE required assist Hip ABduction/ADduction: Both;10 reps;AAROM;Supine;Limitations Hip Abduction/Adduction Limitations: left LE required assist Hip Flexion/Marching: AROM;Limitations;10 reps;Both;Seated Hip Flexion/Marching Limitations: limited AROM Lt LE    General Comments        Pertinent Vitals/Pain Pain Assessment:  Faces Faces Pain Scale: Hurts little more Pain Location: ribs Pain Descriptors / Indicators: Grimacing Pain Intervention(s): Repositioned;Monitored during session    Home Living                      Prior Function            PT Goals (current goals can now be found in the care plan section) Progress towards PT goals: Progressing toward goals    Frequency    Min 3X/week      PT Plan Current plan remains appropriate    Co-evaluation              AM-PAC PT "6 Clicks" Mobility   Outcome Measure  Help needed turning from your back to your side while in a flat bed without using bedrails?: A Lot Help needed moving from lying on your back to sitting on the side of a flat bed without using bedrails?: A Lot Help needed moving to and from a bed to a chair (including a wheelchair)?: Total Help needed standing up from a chair using your arms (e.g., wheelchair or bedside chair)?: Total Help needed to walk in hospital room?: Total Help needed climbing 3-5 steps with a railing? : Total 6 Click Score: 8    End of Session Equipment Utilized During Treatment: Gait belt Activity Tolerance: Patient tolerated treatment well Patient left: with call bell/phone within reach;with family/visitor present;in bed;with bed alarm set Nurse Communication: Mobility status PT Visit Diagnosis: Muscle weakness (generalized) (M62.81);Difficulty in walking, not elsewhere classified (R26.2);Other abnormalities of gait and mobility (R26.89)     Time: 4628-6381 PT Time Calculation (min) (ACUTE ONLY): 42 min  (did not include time pt was on [facility])  Charges:  $Therapeutic Exercise: 8-22 mins $Therapeutic Activity: 23-37 mins                    Jannette Spanner PT, DPT Acute Rehabilitation Services Pager: [phone] Office: [place] 02/29/2020, 1:48 PM

## 2020-03-01 ENCOUNTER — Encounter (HOSPITAL_COMMUNITY): Payer: Self-pay | Admitting: Hematology and Oncology

## 2020-03-01 ENCOUNTER — Ambulatory Visit: Payer: Self-pay

## 2020-03-01 ENCOUNTER — Ambulatory Visit
Admit: 2020-03-01 | Discharge: 2020-03-01 | Disposition: A | Discharge status: Home / Self Care | Payer: Self-pay | Attending: Radiation Oncology | Admitting: Radiation Oncology

## 2020-03-01 ENCOUNTER — Inpatient Hospital Stay (HOSPITAL_COMMUNITY): Payer: Self-pay

## 2020-03-01 HISTORY — PX: IR REMOVAL TUN CV CATH W/O FL: IMG2289

## 2020-03-01 LAB — CBC WITH DIFFERENTIAL/PLATELET
Abs Immature Granulocytes: 0.5 10*3/uL — ABNORMAL HIGH (ref 0.00–0.07)
Basophils Absolute: 0 10*3/uL (ref 0.0–0.1)
Basophils Relative: 0 %
Eosinophils Absolute: 0 10*3/uL (ref 0.0–0.5)
Eosinophils Relative: 0 %
HCT: 33 % — ABNORMAL LOW (ref 39.0–52.0)
Hemoglobin: 11.4 g/dL — ABNORMAL LOW (ref 13.0–17.0)
Immature Granulocytes: 5 %
Lymphocytes Relative: 3 %
Lymphs Abs: 0.4 10*3/uL — ABNORMAL LOW (ref 0.7–4.0)
MCH: 29.8 pg (ref 26.0–34.0)
MCHC: 34.5 g/dL (ref 30.0–36.0)
MCV: 86.4 fL (ref 80.0–100.0)
Monocytes Absolute: 0.6 10*3/uL (ref 0.1–1.0)
Monocytes Relative: 6 %
Neutro Abs: 9.2 10*3/uL — ABNORMAL HIGH (ref 1.7–7.7)
Neutrophils Relative %: 86 %
Platelets: 168 10*3/uL (ref 150–400)
RBC: 3.82 MIL/uL — ABNORMAL LOW (ref 4.22–5.81)
RDW: 11.8 % (ref 11.5–15.5)
WBC: 10.7 10*3/uL — ABNORMAL HIGH (ref 4.0–10.5)
nRBC: 0 % (ref 0.0–0.2)

## 2020-03-01 LAB — BASIC METABOLIC PANEL
Anion gap: 10 (ref 5–15)
BUN: 66 mg/dL — ABNORMAL HIGH (ref 6–20)
CO2: 22 mmol/L (ref 22–32)
Calcium: 11.4 mg/dL — ABNORMAL HIGH (ref 8.9–10.3)
Chloride: 95 mmol/L — ABNORMAL LOW (ref 98–111)
Creatinine, Ser: 1.73 mg/dL — ABNORMAL HIGH (ref 0.61–1.24)
GFR calc Af Amer: 50 mL/min — ABNORMAL LOW (ref >60)
GFR calc non Af Amer: 43 mL/min — ABNORMAL LOW (ref >60)
Glucose, Bld: 192 mg/dL — ABNORMAL HIGH (ref 70–99)
Potassium: 5.4 mmol/L — ABNORMAL HIGH (ref 3.5–5.1)
Sodium: 127 mmol/L — ABNORMAL LOW (ref 135–145)

## 2020-03-01 LAB — GLUCOSE, CAPILLARY
Glucose-Capillary: 183 mg/dL — ABNORMAL HIGH (ref 70–99)
Glucose-Capillary: 264 mg/dL — ABNORMAL HIGH (ref 70–99)
Glucose-Capillary: 270 mg/dL — ABNORMAL HIGH (ref 70–99)
Glucose-Capillary: 353 mg/dL — ABNORMAL HIGH (ref 70–99)

## 2020-03-01 LAB — MAGNESIUM: Magnesium: 1.8 mg/dL (ref 1.7–2.4)

## 2020-03-01 MED ORDER — SODIUM ZIRCONIUM CYCLOSILICATE 10 G PO PACK
10.0000 g | PACK | Freq: Every day | ORAL | Status: DC
  Administered 2020-03-01: 10 g via ORAL
  Filled 2020-03-01 (×2): qty 1

## 2020-03-01 NOTE — Progress Notes (Signed)
PROGRESS NOTE    Trevor Shelton  ASN:[id number] DOB: January 30, 1966 DOA: 02/14/2020 PCP: Trevor Shelton, No Pcp Per   Brief Narrative:  Trevor Shelton is a 54 y.o.Hispanic male withno significant past medical history who presented to [facility] ER on 02/14/20 initially with generalized weakness, left rib pain, lower back pain, constipation for about 1 month. Trevor Shelton had fallen off a ladder approximately 1 month prior to admission, landing on his left side which was then followed by back pain, rib pain, and bilateral lower extremity weakness/numbness/tingling. He had previously been evaluated in the ER on 01/26/2020 after his fall, undergoing multiple x-rays to his spine and ribs.  There were no acute abnormalities appreciated on imaging and he was discharged home.  He had followed up also with orthopedics.  He again had presented back to the ER as noted above on 02/14/2020 with ongoing lower extremity weakness and pain throughout. He then underwent CT chest/abdomen/pelvis which then revealed multiple lytic lesions throughout the skeleton as well as acute left rib fractures involving ninth and 10th ribs. There was concern for multiple myeloma and workup was initiated along with oncology consultation. He also underwent CT-guided bone marrow biopsy/aspiration on 02/19/20.  SPEP did reveal an M spike. He was also followed by neurosurgery and has been initiated on Decadron.  Radiation oncology also has been involved and he has been started on XRT with plans to complete a 10-day course. Bone marrow biopsy did return positive for neoplasm kappa light chain consistent with multiple myeloma.  Plan is to initiate chemo once XRT is completed. He also developed progressive renal failure and was followed by nephrology.  He was started on RRT due to uremic signs.  He was started on dialysis on 02/17/2020 and required 2 sessions (8/11, 8/13). Renal function then improved and he has not required further dialysis since. Currently Trevor Shelton is  getting radiotherapy, plan is to discharge him to CIR when possible.      Principal Problem:   Kappa light chain myeloma (HCC) Active Problems:   Hypercalcemia   AKI (acute kidney injury) ([place])   Pathologic fracture   Hyponatremia   Elevated liver enzymes   Diabetes mellitus ([place])   Hypertension   Constipation   Mouth pain   Assessment & Plan:  Hypercalcemia: Most likely secondary to underlying multiple myeloma.  Treated with pamidronate on 8/10 and was also given few days of calcitonin.  IV fluids have been stopped.  Hypercalcemia has improved.  AKI: Creatinine of 4.94 on admission.  Nephrology was following.  Received CRRT twice while at [facility] [place].  Currently renal function ihas improved with creatinine less than 2.  Urine output and renal function appear to have recovered.  Nephrology has signed off. He   Kappa light chain myeloma: Imaging showed multiple spinal lytic lesions, left rib fracture on ninth and 10th, cord compression.  Currently on radiation therapy as per radiation oncology.  SPEP showed M spike and bone marrow biopsy on 8/13 shows 2 of kappa light chain myeloma.  Oncology and radiation oncology following.  Neurosurgery was also following at [facility]  Currently on Decadron for cord compression.  No plan for surgical intervention.  Started on vision therapy for T5-T9 area left ribs. Plan is for initiating chemotherapy following 10 days of radiation therapy.  Pathological fractures: Secondary to multiple myeloma.  Continue supportive care, pain control.  Hyponatremia/hyperkalemia: Hyponatremia is suspected to be from pseudohyponatremia from hyperproteinemia.  He has persistent mild hyperkalemia.  Started on [place].  Leukocytosis: Resolved  Elevated  liver enzymes: Negative hepatitis panel, CT abdomen/pelvis unremarkable.  Continue monitoring  Diabetes type 2: New diagnosis.  Hemoglobin A1c 6.5.  Also on steroids.  Continue sliding-scale insulin for now.   Might consider oral antihyperglycemics on discharge.  HTN: Currently on amlodipine and Coreg.  Monitor blood pressure  Constipation: Continue bowel regimen  Mouth pain: No finding of oral thrush.  Continue supportive care, Viscous Lidocaine as needed, Magic mouthwash.Now improved     Nutrition Problem: Increased nutrient needs Etiology: cancer and cancer related treatments      DVT prophylaxis:Heparin Page Code Status: Full Family Communication:None at bedside  Status is: Inpatient  Remains inpatient appropriate because:Inpatient level of care appropriate due to severity of illness   Dispo: The Trevor Shelton is from: Home              Anticipated d/c is to: CIR              Anticipated d/c date is: 2 days              Trevor Shelton currently is medically stable for discharge.  Waiting for bed at CIR.  Consultants: Oncology, radiation oncology  Procedures: Bone marrow biopsy  Antimicrobials:  Anti-infectives (From admission, onward)   Start     Dose/Rate Route Frequency Ordered Stop   02/17/20 0930  ceFAZolin (ANCEF) IVPB 2g/100 mL premix        2 g 200 mL/hr over 30 Minutes Intravenous To Radiology 02/17/20 0851 02/17/20 1034      Subjective: Trevor Shelton seen and examined the bedside this morning.  Hemodynamically stable.  Comfortable but weak, lying on the bed.  He complains of some pain on the chest on the rib fracture area.Mouth pain is better.  Objective: Vitals:   02/29/20 0535 02/29/20 1441 03/01/20 0652 03/01/20 0849  BP: 137/86 (!) 149/87 (!) 145/93 (!) 146/96  Pulse: 82 83 73 75  Resp: _0 Temp: (!) 97.5 F (36.4 C) 99 F (37.2 C) 98 F (36.7 C)   TempSrc: Oral Oral Oral   SpO2: 96% 98% 97%   Weight:      Height:        Intake/Output Summary (Last 24 hours) at 03/01/2020 8768 Last data filed at 02/29/2020 1441 Gross per 24 hour  Intake 480 ml  Output --  Net 480 ml   Filed Weights   02/19/20 1630 02/22/20 0430 02/25/20 0444  Weight: 77.6 kg 83.8 kg  73.7 kg    Examination:  General exam: Generalized weakness,Not in distress,average built HEENT:PERRL,Oral mucosa moist, Ear/Nose normal on gross exam Respiratory system: Bilateral equal air entry, normal vesicular breath sounds, no wheezes or crackles  Cardiovascular system: S1 & S2 heard, RRR. No JVD, murmurs, rubs, gallops or clicks. No pedal edema.Dialysis catheter on the right chest Gastrointestinal system: Abdomen is nondistended, soft and nontender. No organomegaly or masses felt. Normal bowel sounds heard. Central nervous system: Alert and oriented. No focal neurological deficits. Extremities: No edema, no clubbing ,no cyanosis, distal peripheral pulses palpable. Skin: No rashes, lesions or ulcers,no icterus ,no pallor MSK: Normal muscle bulk,tone ,power   Data Reviewed: I have personally reviewed following labs and imaging studies  CBC: Recent Labs  Lab 02/24/20 0422 02/27/20 0639 02/28/20 0518 02/29/20 0927 03/01/20 0612  WBC 9.3 10.6* 7.3 9.5 10.7*  NEUTROABS 8.0* 9.0* 6.3 8.4* 9.2*  HGB 10.6* 11.0* 10.2* 11.2* 11.4*  HCT 30.6* 31.9* 29.8* 32.5* 33.0*  MCV 84.5 86.4 86.4 86.0 86.4  PLT 210 207  170 156 185   Basic Metabolic Panel: Recent Labs  Lab 02/24/20 0422 02/24/20 0422 02/25/20 0340 02/27/20 0639 02/28/20 0518 02/29/20 0927 03/01/20 0612  NA 132*   < > 130* 130* 131* 129* 127*  K 4.6   < > 4.9 5.4* 5.1 5.4* 5.4*  CL 102   < > 100 101 100 99 95*  CO2 23   < > 23 20* 21* 21* 22  GLUCOSE 183*   < > 190* 171* 185* 174* 192*  BUN 57*   < > 53* 61* 68* 66* 66*  CREATININE 2.24*   < > 1.98* 1.83* 1.92* 1.63* 1.73*  CALCIUM 9.5   < > 9.5 10.0 10.2 10.8* 11.4*  MG 1.7  --   --  1.8 1.9 1.9 1.8  PHOS 3.5  --   --   --   --   --   --    < > = values in this interval not displayed.   GFR: Estimated Creatinine Clearance: 42.9 mL/min (A) (by C-G formula based on SCr of 1.73 mg/dL (H)). Liver Function Tests: Recent Labs  Lab 02/24/20 0422 02/25/20 0340  02/27/20 0639  AST _0 ALT 56* 47* 47*  ALKPHOS 109 111 130*  BILITOT 0.4 0.5 0.5  PROT 5.7* 5.7* 6.8  ALBUMIN 2.4* 2.4* 3.0*   No results for input(s): LIPASE, AMYLASE in the last 168 hours. No results for input(s): AMMONIA in the last 168 hours. Coagulation Profile: No results for input(s): INR, PROTIME in the last 168 hours. Cardiac Enzymes: Recent Labs  Lab 02/25/20 0340  CKTOTAL 49   BNP (last 3 results) No results for input(s): PROBNP in the last 8760 hours. HbA1C: No results for input(s): HGBA1C in the last 72 hours. CBG: Recent Labs  Lab 02/29/20 0759 02/29/20 1240 02/29/20 1621 02/29/20 2114 03/01/20 0725  GLUCAP 179* 271* 305* 280* 183*   Lipid Profile: No results for input(s): CHOL, HDL, LDLCALC, TRIG, CHOLHDL, LDLDIRECT in the last 72 hours. Thyroid Function Tests: No results for input(s): TSH, T4TOTAL, FREET4, T3FREE, THYROIDAB in the last 72 hours. Anemia Panel: No results for input(s): VITAMINB12, FOLATE, FERRITIN, TIBC, IRON, RETICCTPCT in the last 72 hours. Sepsis Labs: No results for input(s): PROCALCITON, LATICACIDVEN in the last 168 hours.  No results found for this or any previous visit (from the past 240 hour(s)).       Radiology Studies: No results found.      Scheduled Meds: . amLODipine  10 mg Oral Daily  . carvedilol  3.125 mg Oral BID WC  . Chlorhexidine Gluconate Cloth  6 each Topical Q0600  . dexamethasone (DECADRON) injection  4 mg Intravenous Q8H  . feeding supplement (ENSURE ENLIVE)  237 mL Oral TID BM  . heparin injection (subcutaneous)  5,000 Units Subcutaneous Q8H  . insulin aspart  0-9 Units Subcutaneous TID WC  . insulin glargine  5 Units Subcutaneous QHS  . lidocaine  1 patch Transdermal Q24H  . magic mouthwash  10 mL Oral QID  . pantoprazole  40 mg Oral Daily  . polyethylene glycol  17 g Oral Daily  . senna-docusate  1 tablet Oral BID  . sodium chloride flush  3 mL Intravenous Once  . sodium zirconium  cyclosilicate  10 g Oral Daily   Continuous Infusions:   LOS: 16 days    Time spent: 25 mins.More than 50% of that time was spent in counseling and/or coordination of care.      Donatello Kleve  Tawanna Solo, MD [facility] P8/24/2021, 9:03 AM

## 2020-03-01 NOTE — Plan of Care (Signed)
Pt VS WNL.  No complaints at this time.   Problem: Education: Goal: Knowledge of General Education information will improve Description: Including pain rating scale, medication(s)/side effects and non-pharmacologic comfort measures Outcome: Progressing   Problem: Health Behavior/Discharge Planning: Goal: Ability to manage health-related needs will improve Outcome: Progressing   Problem: Clinical Measurements: Goal: Ability to maintain clinical measurements within normal limits will improve Outcome: Progressing Goal: Will remain free from infection Outcome: Progressing Goal: Diagnostic test results will improve Outcome: Progressing Goal: Respiratory complications will improve Outcome: Progressing Goal: Cardiovascular complication will be avoided Outcome: Progressing   Problem: Activity: Goal: Risk for activity intolerance will decrease Outcome: Progressing   Problem: Nutrition: Goal: Adequate nutrition will be maintained Outcome: Progressing   Problem: Coping: Goal: Level of anxiety will decrease Outcome: Progressing   Problem: Elimination: Goal: Will not experience complications related to bowel motility Outcome: Progressing Goal: Will not experience complications related to urinary retention Outcome: Progressing   Problem: Pain Managment: Goal: General experience of comfort will improve Outcome: Progressing   Problem: Safety: Goal: Ability to remain free from injury will improve Outcome: Progressing   Problem: Skin Integrity: Goal: Risk for impaired skin integrity will decrease Outcome: Progressing   

## 2020-03-01 NOTE — Progress Notes (Signed)
Inpatient Rehab Admissions Coordinator:   I have spoken with SW who confirmed that pt. Is in agreement to CIR admission as uninsured/medicaid pending. They stated understanding that they may be billed for services provided if Medicaid does not approve application and back-date to cover CIR admission. Will follow for potential admission later this week, pending medical readiness.    Clemens Catholic, [place], [place] Admissions Coordinator  [phone] ([place]) [phone] (office)

## 2020-03-01 NOTE — Progress Notes (Signed)
Inpatient Diabetes Program Recommendations  AACE/ADA: New Consensus Statement on Inpatient Glycemic Control (2015)  Target Ranges:  Prepandial:   less than 140 mg/dL      Peak postprandial:   less than 180 mg/dL (1-2 hours)      Critically ill patients:  140 - 180 mg/dL   Lab Results  Component Value Date   GLUCAP 183 (H) 03/01/2020   HGBA1C 6.5 (H) 02/18/2020    Review of Glycemic Control Results for Trevor Shelton, Trevor Shelton (MRN [id number]) as of 03/01/2020 10:49  Ref. Range 02/29/2020 07:59 02/29/2020 12:40 02/29/2020 16:21 02/29/2020 21:14 03/01/2020 07:25  Glucose-Capillary Latest Ref Range: 70 - 99 mg/dL 179 (H) 271 (H) 305 (H) 280 (H) 183 (H)   Diabetes history: New DM, 6.5 A1c    Current orders for Inpatient glycemic control:  Lantus 5 units qhs Novolog 0-9 units tid  Ensure Enlive tid between meals Decadron 4 mg Q8 hours  Inpatient Diabetes Program Recommendations:    -Consider adding Novolog 3 units tid meal coverage when eating >50% of meals.  Thanks,  Tama Headings RN, MSN, BC-ADM Inpatient Diabetes Coordinator Team Pager [phone] (8a-5p)

## 2020-03-01 NOTE — Procedures (Signed)
Per order of TRH, patient's right internal jugular tunneled HD catheter was removed in its entirety without immediate complications.  Medications- none. Vaseline gauze applied over insertion site.EBL< 1 cc.

## 2020-03-01 NOTE — TOC Progression Note (Signed)
Transition of Care [facility]) - Progression Note    Patient Details  Name: Trevor Shelton MRN: [id number] Date of Birth: March 19, 1966  [facility]) CM/SW Contact  Shade Flood, LCSW Phone Number: 03/01/2020, 2:25 PM  Clinical Narrative:     TOC following. Received message from [facility] admissions coordinator today regarding pt's self-pay status/medicaid pending. She wanted to make sure pt understood that if the medicaid is denied, he will end up being billed for the CIR stay ($1500-$1700 per day) and did he want to proceed with admission.   This LCSW met with pt and his wife today with the assistance of an interpreter. Explained above to pt and his wife including daily rate. They verbalize understanding and expressed desire to proceed with the CIR admission. Answered related questions for pt and his wife.  Updated CIR admissions. Anticipating possible transfer to CIR in the next few days.  Expected Discharge Plan: IP Rehab Facility Barriers to Discharge: Continued Medical Work up  Expected Discharge Plan and Services Expected Discharge Plan: Ord In-house Referral: Clinical Social Work   Post Acute Care Choice: IP Rehab Living arrangements for the past 2 months: Single Family Home                                       Social Determinants of Health (SDOH) Interventions    Readmission Risk Interventions Readmission Risk Prevention Plan 02/29/2020  Transportation Screening Complete  Medication Review Press photographer) Complete  Some recent data might be hidden

## 2020-03-01 NOTE — Progress Notes (Signed)
Occupational Therapy Treatment Patient Details Name: Decorey Wahlert MRN: [id number] DOB: 10-Sep-1965 Today's Date: 03/01/2020    History of present illness Pt is a 54 y.o. male admitted 02/14/20 with progressive BLE weakness, rib/back pain and constipation for one month since fall from ladder. Abdominal/pelvic CT shows diffuse lytic lesions throughout skeleton; pathologic fxs of T5 and T9, acute L rib 9-10 fxs. Also with AKI, hypercalcemia. Per neuro, pt with likely multiple myeloma and diffuse spinal involvement including high grade epidural disease at T6. Pt has begun palliative radiation tx while admitted. CT-guided bone marrow biopsy 8/13 with (+) pathology for "hypercellular bone marrow for age with plasma cell neoplasm." PMH includes HTN.   Yellow theraband issued and pt educated on use      Follow Up Recommendations  CIR    Equipment Recommendations  3 in 1 bedside commode;Wheelchair (measurements OT);Wheelchair cushion (measurements OT)    Recommendations for Other Services Rehab consult    Precautions / Restrictions Precautions Precautions: Back;Fall;Other (comment) Precaution Comments: Significant BLE weakness and ataxia-like movements (LLE>RLE); back and rib precautions for comfort due to fxs              ADL either performed or assessed with clinical judgement   ADL Overall ADL's : Needs assistance/impaired                                       General ADL Comments: pt had returned from radiation.  Pt agreed to gentle BUE AROM with yellow theraband.  Pt willing and seemed to enjoy exercises.     Vision Patient Visual Report: No change from baseline            Cognition Arousal/Alertness: Awake/alert Behavior During Therapy: WFL for tasks assessed/performed Overall Cognitive Status: Within Functional Limits for tasks assessed                                          Exercises General Exercises - Upper Extremity Shoulder  Flexion: AROM;Theraband;10 reps Theraband Level (Shoulder Flexion): Level 1 (Yellow) Shoulder Extension: AROM;10 reps;Theraband Theraband Level (Shoulder Extension): Level 1 (Yellow) Elbow Flexion: AROM;10 reps;Both;Supine Elbow Extension: AROM;Supine;10 reps;Both    Prior Functioning/Environment              Frequency  Min 2X/week        Progress Toward Goals  OT Goals(current goals can now be found in the care plan section)  Progress towards OT goals: Progressing toward goals (added goal for BUE exercise/strengthening)     Plan Discharge plan remains appropriate    Co-evaluation                 AM-PAC OT "6 Clicks" Daily Activity     Outcome Measure   Help from another person eating meals?: A Little Help from another person taking care of personal grooming?: A Lot Help from another person toileting, which includes using toliet, bedpan, or urinal?: Total Help from another person bathing (including washing, rinsing, drying)?: A Lot Help from another person to put on and taking off regular upper body clothing?: A Lot Help from another person to put on and taking off regular lower body clothing?: Total 6 Click Score: 11    End of Session Equipment Utilized During Treatment:  (stedy)  OT Visit Diagnosis: Unsteadiness on feet (R26.81);Other  abnormalities of gait and mobility (R26.89);Other symptoms and signs involving the nervous system (R29.898);Muscle weakness (generalized) (M62.81);Pain Pain - part of body:  (back, ribs)   Activity Tolerance Patient tolerated treatment well   Patient Left in bed;with family/visitor present   Nurse Communication Mobility status;Need for lift equipment;Other (comment) (maxi move pad placed in chair per NT request)        Time: 3762-8315 OT Time Calculation (min): 14 min  Charges: OT General Charges $OT Visit: 1 Visit OT Treatments $Therapeutic Exercise: 8-22 mins  Kari Baars, [place] [phone] Office- [phone], Edwena Felty D 03/01/2020, 6:25 PM

## 2020-03-02 ENCOUNTER — Ambulatory Visit
Admit: 2020-03-02 | Discharge: 2020-03-02 | Disposition: A | Discharge status: Home / Self Care | Payer: Self-pay | Attending: Radiation Oncology | Admitting: Radiation Oncology

## 2020-03-02 ENCOUNTER — Encounter: Payer: Self-pay | Admitting: Radiation Oncology

## 2020-03-02 ENCOUNTER — Ambulatory Visit: Payer: Self-pay

## 2020-03-02 LAB — BASIC METABOLIC PANEL
Anion gap: 9 (ref 5–15)
BUN: 65 mg/dL — ABNORMAL HIGH (ref 6–20)
CO2: 23 mmol/L (ref 22–32)
Calcium: 11.5 mg/dL — ABNORMAL HIGH (ref 8.9–10.3)
Chloride: 92 mmol/L — ABNORMAL LOW (ref 98–111)
Creatinine, Ser: 1.56 mg/dL — ABNORMAL HIGH (ref 0.61–1.24)
GFR calc Af Amer: 57 mL/min — ABNORMAL LOW (ref >60)
GFR calc non Af Amer: 49 mL/min — ABNORMAL LOW (ref >60)
Glucose, Bld: 241 mg/dL — ABNORMAL HIGH (ref 70–99)
Potassium: 5.3 mmol/L — ABNORMAL HIGH (ref 3.5–5.1)
Sodium: 124 mmol/L — ABNORMAL LOW (ref 135–145)

## 2020-03-02 LAB — CBC WITH DIFFERENTIAL/PLATELET
Abs Immature Granulocytes: 0.47 10*3/uL — ABNORMAL HIGH (ref 0.00–0.07)
Basophils Absolute: 0 10*3/uL (ref 0.0–0.1)
Basophils Relative: 0 %
Eosinophils Absolute: 0 10*3/uL (ref 0.0–0.5)
Eosinophils Relative: 0 %
HCT: 32.7 % — ABNORMAL LOW (ref 39.0–52.0)
Hemoglobin: 11.4 g/dL — ABNORMAL LOW (ref 13.0–17.0)
Immature Granulocytes: 5 %
Lymphocytes Relative: 3 %
Lymphs Abs: 0.3 10*3/uL — ABNORMAL LOW (ref 0.7–4.0)
MCH: 29.8 pg (ref 26.0–34.0)
MCHC: 34.9 g/dL (ref 30.0–36.0)
MCV: 85.4 fL (ref 80.0–100.0)
Monocytes Absolute: 0.6 10*3/uL (ref 0.1–1.0)
Monocytes Relative: 6 %
Neutro Abs: 8.9 10*3/uL — ABNORMAL HIGH (ref 1.7–7.7)
Neutrophils Relative %: 86 %
Platelets: 148 10*3/uL — ABNORMAL LOW (ref 150–400)
RBC: 3.83 MIL/uL — ABNORMAL LOW (ref 4.22–5.81)
RDW: 11.9 % (ref 11.5–15.5)
WBC: 10.3 10*3/uL (ref 4.0–10.5)
nRBC: 0 % (ref 0.0–0.2)

## 2020-03-02 LAB — GLUCOSE, CAPILLARY
Glucose-Capillary: 283 mg/dL — ABNORMAL HIGH (ref 70–99)
Glucose-Capillary: 308 mg/dL — ABNORMAL HIGH (ref 70–99)
Glucose-Capillary: 220 mg/dL — ABNORMAL HIGH (ref 70–99)
Glucose-Capillary: 274 mg/dL — ABNORMAL HIGH (ref 70–99)

## 2020-03-02 LAB — MAGNESIUM: Magnesium: 1.8 mg/dL (ref 1.7–2.4)

## 2020-03-02 MED ORDER — SODIUM ZIRCONIUM CYCLOSILICATE 10 G PO PACK
10.0000 g | PACK | Freq: Two times a day (BID) | ORAL | Status: DC
  Administered 2020-03-02 – 2020-03-03 (×3): 10 g via ORAL
  Filled 2020-03-02 (×3): qty 1

## 2020-03-02 MED ORDER — INSULIN ASPART 100 UNIT/ML ~~LOC~~ SOLN
3.0000 [IU] | Freq: Three times a day (TID) | SUBCUTANEOUS | Status: DC
  Administered 2020-03-03: 3 [IU] via SUBCUTANEOUS

## 2020-03-02 MED ORDER — PROSOURCE PLUS PO LIQD
30.0000 mL | Freq: Two times a day (BID) | ORAL | Status: DC
  Administered 2020-03-02 – 2020-03-03 (×3): 30 mL via ORAL
  Filled 2020-03-02 (×3): qty 30

## 2020-03-02 MED ORDER — INSULIN GLARGINE 100 UNIT/ML ~~LOC~~ SOLN
10.0000 [IU] | Freq: Every day | SUBCUTANEOUS | Status: DC
  Administered 2020-03-02: 10 [IU] via SUBCUTANEOUS
  Filled 2020-03-02: qty 0.1

## 2020-03-02 NOTE — Progress Notes (Signed)
Nutrition Follow-up  DOCUMENTATION CODES:   Obesity unspecified  INTERVENTION:   -Continue Ensure Enlive po TID, each supplement provides 350 kcal and 20 grams of protein -Prosource Plus PO BID, each provides 100 kcals and 15g protein  NUTRITION DIAGNOSIS:   Increased nutrient needs related to cancer and cancer related treatments as evidenced by estimated needs.  Ongoing.  GOAL:   Patient will meet greater than or equal to 90% of their needs  Progressing.  MONITOR:   Supplement acceptance, PO intake, Diet advancement, Labs, Weight trends, Skin, I & O's  ASSESSMENT:   Trevor Shelton is a 54 y.o. male with no significant past medical history presents to emergency department with generalized weakness, left rib pain, lower back pain, constipation since 1 month.   8/8 -admitted 8/11- sp tunneled HD cath placement, first HD treatment 8/13- s/p bone marrow biopsy, HD 8/20 -transferred to [facility] for XRT  Patient currently receiving XRT for treatment of multiple myeloma. Per chart review, plan is for pt to start chemotherapy as well. Pt expected to discharge to CIR.   Last PO documented was 100% of meals on 8/23. Pt is drinking Ensure supplements, will add Prosource for additional protein as well given increased needs from cancer treatment.  Admission weight: 160 lbs. Current weight: 103 lbs. -outlier, will continue to monitor weight trends  I/Os: -8L since 8/11 UOP: 1.8L x 24 hrs  Medications: Miralax, Senokot, Lokelma Labs reviewed: CBGs: 283-353 Low Na Elevated K, Ca  Diet Order:   Diet Order            Diet Carb Modified Fluid consistency: Thin; Room service appropriate? Yes  Diet effective now                 EDUCATION NEEDS:   No education needs have been identified at this time  Skin:  Skin Assessment: Reviewed RN Assessment  Last BM:  8/24- type 2  Height:   Ht Readings from Last 1 Encounters:  02/14/20 _0  (1.6 m)    Weight:   Wt Readings from  Last 1 Encounters:  03/02/20 47.1 kg    BMI:  Body mass index is 18.39 kg/m.  Estimated Nutritional Needs:   Kcal:  1850-2050  Protein:  95-105g  Fluid:  1000 ml+ UOP  Clayton Bibles, MS, RD, LDN Inpatient Clinical Dietitian Contact information available via Amion

## 2020-03-02 NOTE — Progress Notes (Addendum)
Inpatient Rehabilitation Admissions Coordinator  Per radiation oncology department at [facility], patient is receiving his last planned 10 treatments of radiation today. I await bed availability to admit to CIR this week . Discussed planned Velcade, dexamethasone and Cytoxan with Rehab Nursing Director.  We will follow up tomorrow. Await bed availability.  Danne Baxter, RN, MSN Rehab Admissions Coordinator [phone] 03/02/2020 3:18 PM

## 2020-03-02 NOTE — Progress Notes (Signed)
PROGRESS NOTE    Trevor Shelton  QTM:[id number] DOB: 1966/06/17 DOA: 02/14/2020 PCP: Patient, No Pcp Per   Brief Narrative:  Patient is a 54 y.o.Hispanic male withno significant past medical history who presented to [facility] ER on 02/14/20 initially with generalized weakness, left rib pain, lower back pain, constipation for about 1 month. Patient had fallen off a ladder approximately 1 month prior to admission, landing on his left side which was then followed by back pain, rib pain, and bilateral lower extremity weakness/numbness/tingling. He had previously been evaluated in the ER on 01/26/2020 after his fall, undergoing multiple x-rays to his spine and ribs.  There were no acute abnormalities appreciated on imaging and he was discharged home.  He had followed up also with orthopedics.  He again had presented back to the ER as noted above on 02/14/2020 with ongoing lower extremity weakness and pain throughout. He then underwent CT chest/abdomen/pelvis which then revealed multiple lytic lesions throughout the skeleton as well as acute left rib fractures involving ninth and 10th ribs. There was concern for multiple myeloma and workup was initiated along with oncology consultation. He also underwent CT-guided bone marrow biopsy/aspiration on 02/19/20.  SPEP did reveal an M spike. He was also followed by neurosurgery and has been initiated on Decadron.  Radiation oncology also has been involved and he has been started on XRT with plans to complete a 10-day course. Bone marrow biopsy did return positive for neoplasm kappa light chain consistent with multiple myeloma.  Plan is to initiate chemo once XRT is completed. He also developed progressive renal failure and was followed by nephrology.  He was started on RRT due to uremic signs.  He was started on dialysis on 02/17/2020 and required 2 sessions (8/11, 8/13). Renal function then improved and he has not required further dialysis since. Currently patient is  getting radiotherapy.  Oncology planning to initiate chemotherapy as an outpatient after completion of radiotherapy sessions. PT/OT have recommended CIR on discharge.  Patient is medically stable for discharge to inpatient rehab as soon as bed is available.      Principal Problem:   Kappa light chain myeloma (HCC) Active Problems:   Hypercalcemia   AKI (acute kidney injury) ([place])   Pathologic fracture   Hyponatremia   Elevated liver enzymes   Diabetes mellitus ([place])   Hypertension   Constipation   Mouth pain    Hypercalcemia: Most likely secondary to underlying multiple myeloma.  Treated with pamidronate on 8/10 and was also given few days of calcitonin.  IV fluids have been stopped.  Calcium is 11.5 today  AKI: Creatinine of 4.94 on admission.  Nephrology was following.  Received CRRT twice while at [facility].  Currently renal function ihas improved with creatinine less than 2.  Urine output and renal function appear to have recovered.  Nephrology has signed off.  Kappa light chain myeloma: Imaging showed multiple spinal lytic lesions, left rib fracture on ninth and 10th, cord compression.  Currently on radiation therapy as per radiation oncology.  SPEP showed M spike and bone marrow biopsy on 8/13 shows 2 of kappa light chain myeloma.  Oncology and radiation oncology following.  Neurosurgery was also following at [facility].  Currently on Decadron for cord compression.  No plan for surgical intervention.  Started on radiation therapy for T5-T9 area/ left ribs. Plan is for initiating chemotherapy following 10 days of radiation therapy as an outpatient. We will change Decadron to oral on discharge to CIR.  Pathological fractures:  Secondary to multiple myeloma.  Continue supportive care, pain control.  Hyponatremia/hyperkalemia: Hyponatremia is suspected to be from pseudohyponatremia from hyperproteinemia.  He has persistent mild hyperkalemia.  Started on Lokelma BID.  Continue to  monitor BMP.  Leukocytosis: Resolved  Elevated liver enzymes: Negative hepatitis panel, CT abdomen/pelvis unremarkable.  Continue monitoring  Diabetes type 2: New diagnosis.  Hemoglobin A1c 6.5.  Also on steroids.  Continue sliding-scale insulin for now,lantus.  Might consider oral antihyperglycemics on discharge.  HTN: Currently on amlodipine and Coreg.  Monitor blood pressure  Constipation: Continue bowel regimen  Mouth pain: No finding of oral thrush.  Continue supportive care, Viscous Lidocaine as needed, Magic mouthwash.Now improved  Loss of appetite: Dietitian following.     Nutrition Problem: Increased nutrient needs Etiology: cancer and cancer related treatments      DVT prophylaxis:Heparin [place] Code Status: Full Family Communication: Discussed with wife at the bedside, discussed with daughter-in-law on phone on 03/02/2020 Status is: Inpatient  Remains inpatient appropriate because:Inpatient level of care appropriate due to severity of illness   Dispo: The patient is from: Home              Anticipated d/c is to: CIR              Anticipated d/c date is: As soon as bed is available              Patient currently is medically stable for discharge.  Waiting for bed at CIR.  Consultants: Oncology, radiation oncology  Procedures: Bone marrow biopsy  Antimicrobials:  Anti-infectives (From admission, onward)   Start     Dose/Rate Route Frequency Ordered Stop   02/17/20 0930  ceFAZolin (ANCEF) IVPB 2g/100 mL premix        2 g 200 mL/hr over 30 Minutes Intravenous To Radiology 02/17/20 0851 02/17/20 1034      Subjective: Patient seen and examined at the bedside this morning.  He looks better.  He was eating his breakfast.  Complains of low appetite and some back pain but otherwise looking comfortable.  Objective: Vitals:   03/01/20 1543 03/01/20 2103 03/02/20 0348 03/02/20 0500  BP: (!) 145/88 (!) 143/85 (!) 145/90   Pulse: 79 84 87   Resp: _0 Temp:  98.2 F (36.8 C) 98.8 F (37.1 C) 98.4 F (36.9 C)   TempSrc: Oral Oral Oral   SpO2: 98% 98% 97%   Weight:    47.1 kg  Height:        Intake/Output Summary (Last 24 hours) at 03/02/2020 0813 Last data filed at 03/02/2020 5053 Gross per 24 hour  Intake --  Output 1800 ml  Net -1800 ml   Filed Weights   02/22/20 0430 02/25/20 0444 03/02/20 0500  Weight: 83.8 kg 73.7 kg 47.1 kg    Examination:   General exam: Appears calm and comfortable ,Not in distress,average built HEENT:PERRL,Oral mucosa moist, Ear/Nose normal on gross exam Respiratory system: Bilateral equal air entry, normal vesicular breath sounds, no wheezes or crackles  Cardiovascular system: S1 & S2 heard, RRR. No JVD, murmurs, rubs, gallops or clicks. Gastrointestinal system: Abdomen is nondistended, soft and nontender. No organomegaly or masses felt. Normal bowel sounds heard. Central nervous system: Alert and oriented. No focal neurological deficits. Extremities: No edema, no clubbing ,no cyanosis. Skin: No rashes, lesions or ulcers,no icterus ,no pallor    Data Reviewed: I have personally reviewed following labs and imaging studies  CBC: Recent Labs  Lab 02/27/20 0639 02/28/20  0518 02/29/20 0927 03/01/20 0612 03/02/20 0541  WBC 10.6* 7.3 9.5 10.7* 10.3  NEUTROABS 9.0* 6.3 8.4* 9.2* 8.9*  HGB 11.0* 10.2* 11.2* 11.4* 11.4*  HCT 31.9* 29.8* 32.5* 33.0* 32.7*  MCV 86.4 86.4 86.0 86.4 85.4  PLT 207 170 156 168 542*   Basic Metabolic Panel: Recent Labs  Lab 02/27/20 0639 02/28/20 0518 02/29/20 0927 03/01/20 0612 03/02/20 0541  NA 130* 131* 129* 127* 124*  K 5.4* 5.1 5.4* 5.4* 5.3*  CL 101 100 99 95* 92*  CO2 20* 21* 21* 22 23  GLUCOSE 171* 185* 174* 192* 241*  BUN 61* 68* 66* 66* 65*  CREATININE 1.83* 1.92* 1.63* 1.73* 1.56*  CALCIUM 10.0 10.2 10.8* 11.4* 11.5*  MG 1.8 1.9 1.9 1.8 1.8   GFR: Estimated Creatinine Clearance: 35.2 mL/min (A) (by C-G formula based on SCr of 1.56 mg/dL  (H)). Liver Function Tests: Recent Labs  Lab 02/25/20 0340 02/27/20 0639  AST 27 30  ALT 47* 47*  ALKPHOS 111 130*  BILITOT 0.5 0.5  PROT 5.7* 6.8  ALBUMIN 2.4* 3.0*   No results for input(s): LIPASE, AMYLASE in the last 168 hours. No results for input(s): AMMONIA in the last 168 hours. Coagulation Profile: No results for input(s): INR, PROTIME in the last 168 hours. Cardiac Enzymes: Recent Labs  Lab 02/25/20 0340  CKTOTAL 49   BNP (last 3 results) No results for input(s): PROBNP in the last 8760 hours. HbA1C: No results for input(s): HGBA1C in the last 72 hours. CBG: Recent Labs  Lab 02/29/20 2114 03/01/20 0725 03/01/20 1130 03/01/20 1722 03/01/20 2104  GLUCAP 280* 183* 264* 270* 353*   Lipid Profile: No results for input(s): CHOL, HDL, LDLCALC, TRIG, CHOLHDL, LDLDIRECT in the last 72 hours. Thyroid Function Tests: No results for input(s): TSH, T4TOTAL, FREET4, T3FREE, THYROIDAB in the last 72 hours. Anemia Panel: No results for input(s): VITAMINB12, FOLATE, FERRITIN, TIBC, IRON, RETICCTPCT in the last 72 hours. Sepsis Labs: No results for input(s): PROCALCITON, LATICACIDVEN in the last 168 hours.  No results found for this or any previous visit (from the past 240 hour(s)).       Radiology Studies: IR Removal Tun Cv Cath W/O FL  Result Date: 03/01/2020 INDICATION: Patient with history of multiple myeloma with acute kidney injury and placement of tunneled right internal jugular HD catheter on 02/17/2020; now with improving creatinine and urine output. Request received for HD catheter removal. EXAM: REMOVAL TUNNELED CENTRAL VENOUS CATHETER MEDICATIONS: None ANESTHESIA/SEDATION: None FLUOROSCOPY TIME:  None COMPLICATIONS: None immediate. PROCEDURE: Informed written consent was obtained from the patient after a thorough discussion of the procedural risks, benefits and alternatives. All questions were addressed. Maximal Sterile Barrier Technique was utilized  including caps, mask, sterile gowns, sterile gloves, sterile drape, hand hygiene and skin antiseptic. A timeout was performed prior to the initiation of the procedure. The patient's right chest and catheter was prepped and draped in a normal sterile fashion. Heparin was removed from both ports of catheter. Using gentle manual traction the cuff of the catheter was exposed and the catheter was removed in it's entirety. Pressure was held till hemostasis was obtained. A sterile dressing was applied. The patient tolerated the procedure well with no immediate complications. IMPRESSION: Successful catheter removal as described above. Read by: Rowe Robert, PA-C Electronically Signed   By: Jacqulynn Cadet M.D.   On: 03/01/2020 13:40        Scheduled Meds: . amLODipine  10 mg Oral Daily  . carvedilol  3.125 mg Oral BID WC  . Chlorhexidine Gluconate Cloth  6 each Topical Q0600  . dexamethasone (DECADRON) injection  4 mg Intravenous Q8H  . feeding supplement (ENSURE ENLIVE)  237 mL Oral TID BM  . heparin injection (subcutaneous)  5,000 Units Subcutaneous Q8H  . insulin aspart  0-9 Units Subcutaneous TID WC  . insulin glargine  5 Units Subcutaneous QHS  . lidocaine  1 patch Transdermal Q24H  . pantoprazole  40 mg Oral Daily  . polyethylene glycol  17 g Oral Daily  . senna-docusate  1 tablet Oral BID  . sodium chloride flush  3 mL Intravenous Once  . sodium zirconium cyclosilicate  10 g Oral BID   Continuous Infusions:   LOS: 17 days    Time spent: 25 mins.More than 50% of that time was spent in counseling and/or coordination of care.      Shelly Coss, MD [facility] P8/25/2021, 8:13 AM

## 2020-03-02 NOTE — PMR Pre-admission (Signed)
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 Patient does have green card for [place]  Financial Counselor:       Phone#:   The "Data Collection Information Summary" for patients in Inpatient Rehabilitation Facilities with attached "Privacy Act [place] Records" was provided and verbally reviewed with: N/A  Emergency Contact Information Contact Information    Name Relation Home Work Mobile   Kanaan, Kag[place] Spouse   [phone]   Izac, Faulkenberry Daughter   [phone]      Current Medical History  Patient Admitting Diagnosis: Debility  History of Present Illness: 54 year old male with no significant medical history. Presented to ED at [facility] with generalized weakness, left rib pain, lower back pain and constipation for one month. History of fall from ladder one month prior to admit landing on his left side followed by back pain, rib pain and BLE weakness numbness and tingling. Previously evaluated in ED on 01/26/2020 after his fall. No acute abnormalities on imaging and he was discharged home. F/u with orthopedics.  Presented back to ed 02/14/2020 with ongoing LE weakness and pain throughout. Underwent CT chest/abdomen/pelvis which then revealed multiple lytic lesions throughout the skeleton as well as acute left 9th and 10 th rib fractures. There was concern for multiple myeloma and workup initiated with oncology consultation. Underwent CT guided bone marrow biopsy on 02/19/2020. SPEP did reveal an M spike. Hypercalcemia felt secondary to underlying multiple myeloma and treated with pamidronate on 8/10 and given a few days of calcitonin.   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 No plan for surgical  intervention.  Radiation oncology consulted and he completed planned 10 day course of radiation on 03/02/2020. Bone marrow biopsy did reveal positive for neoplasm kappa light chain consistent with multiple myeloma. Plan to initiate chemo once XRT is complete. Plan likely Velcade sq, dexamethasone and oral cytoxan per oncology weekly per [facility] NP.  New diagnosis of type 2 Diabetes with Hgb A1c 6.5. Also on steroids. Continue sliding scale insulin. May consider oral antihyperglycemics. On amlodipine and Coreg for HTN. Mouth pain treated with viscous lidocaine and magic mouthwash with no finding of oral thrush.  He developed progressive renal failure and was followed by Nephrology. He was started of RRT due to uremic signs., He underwent dialysis on 8/11 and required 2 sessions. Renal function then improved and no further dialysis expected.     Patient's medical [facility]  has been reviewed by the rehabilitation admission coordinator and physician.  Past Medical History  Past Medical History:  Diagnosis Date  . AKI (acute kidney injury) ([place]) 02/2020  . Hypercalcemia 02/2020  . Hypercholesteremia   . Hypertension     Family History   family history is not on file.  Prior Rehab/Hospitalizations Has the patient had prior rehab or hospitalizations prior to admission? Yes  Has the patient had major surgery during 100 days prior to admission? Yes   Current Medications  Current Facility-Administered Medications:  .  (feeding supplement) PROSource Plus liquid 30 mL, 30 mL, Oral, BID BM, Adhikari, Amrit, MD, 30 mL at 03/02/20 1337 .  acetaminophen (TYLENOL) tablet 650 mg, 650 mg, Oral, Q6H PRN, 650 mg at 03/02/20 1922 **OR**  acetaminophen (TYLENOL) suppository 650 mg, 650 mg, Rectal, Q6H PRN, Pahwani, Rinka R, MD .  amLODipine (NORVASC) tablet 10 mg, 10 mg, Oral, Daily, Reesa Chew, MD, 10 mg at 03/03/20 1040 .  bisacodyl (DULCOLAX)  suppository 10 mg, 10 mg, Rectal, Daily PRN, Raiford Noble Latif, DO, 10 mg at 02/18/20 1954 .  carvedilol (COREG) tablet 3.125 mg, 3.125 mg, Oral, BID WC, Florencia Reasons, MD, 3.125 mg at 03/03/20 0841 .  Chlorhexidine Gluconate Cloth 2 % PADS 6 each, 6 each, Topical, Q0600, Dwana Melena, MD, 6 each at 03/03/20 [phone] .  chlorproMAZINE (THORAZINE) tablet 25 mg, 25 mg, Oral, QID PRN, Dwyane Dee, MD, 25 mg at 03/03/20 0846 .  clonazepam (KLONOPIN) disintegrating tablet 0.25 mg, 0.25 mg, Oral, TID PRN, Florencia Reasons, MD, 0.25 mg at 02/25/20 2104 .  dexamethasone (DECADRON) injection 4 mg, 4 mg, Intravenous, Q8H, Sheikh, Omair Startup, DO, 4 mg at 03/03/20 0545 .  feeding supplement (ENSURE ENLIVE) (ENSURE ENLIVE) liquid 237 mL, 237 mL, Oral, TID BM, Sheikh, Omair Latif, DO, 237 mL at 03/03/20 0842 .  heparin injection 5,000 Units, 5,000 Units, Subcutaneous, Q8H, Allred, Darrell K, PA-C, 5,000 Units at 03/03/20 0545 .  hydrALAZINE (APRESOLINE) injection 10 mg, 10 mg, Intravenous, Q6H PRN, Raiford Noble Latif, DO, 10 mg at 02/25/20 1718 .  HYDROmorphone (DILAUDID) injection 0.5-1 mg, 0.5-1 mg, Intravenous, Q2H PRN, Pahwani, Rinka R, MD, 1 mg at 03/02/20 0525 .  insulin aspart (novoLOG) injection 0-9 Units, 0-9 Units, Subcutaneous, TID WC, Florencia Reasons, MD, 2 Units at 03/03/20 [phone] .  insulin aspart (novoLOG) injection 3 Units, 3 Units, Subcutaneous, TID WC, Shelly Coss, MD, 3 Units at 03/03/20 [phone] .  insulin glargine (LANTUS) injection 10 Units, 10 Units, Subcutaneous, QHS, Shelly Coss, MD, 10 Units at 03/02/20 2110 .  lidocaine (LIDODERM) 5 % 1 patch, 1 patch, Transdermal, Q24H, Nolberto Hanlon, MD, 1 patch at 03/02/20 1337 .  lidocaine (XYLOCAINE) 2 % viscous mouth solution 15 mL, 15 mL, Mouth/Throat, Q6H PRN, Florencia Reasons, MD, 15 mL at 02/28/20 0140 .  ondansetron (ZOFRAN) tablet 4 mg, 4 mg, Oral, Q6H PRN, 4 mg at 02/15/20 1616 **OR** ondansetron (ZOFRAN) injection 4 mg, 4 mg, Intravenous, Q6H PRN, Pahwani, Rinka R,  MD, 4 mg at 02/17/20 0020 .  oxyCODONE (Oxy IR/ROXICODONE) immediate release tablet 5 mg, 5 mg, Oral, Q4H PRN, Pahwani, Rinka R, MD, 5 mg at 03/03/20 0545 .  pantoprazole (PROTONIX) EC tablet 40 mg, 40 mg, Oral, Daily, [place], Yujing Z, RPH, 40 mg at 03/03/20 1041 .  phenol (CHLORASEPTIC) mouth spray 1 spray, 1 spray, Mouth/Throat, PRN, Florencia Reasons, MD, 1 spray at 02/25/20 [phone] .  polyethylene glycol (MIRALAX / GLYCOLAX) packet 17 g, 17 g, Oral, Daily PRN, Raiford Noble Latif, DO, 17 g at 02/24/20 1456 .  polyethylene glycol (MIRALAX / GLYCOLAX) packet 17 g, 17 g, Oral, Daily, Florencia Reasons, MD, 17 g at 03/03/20 1040 .  senna-docusate (Senokot-S) tablet 1 tablet, 1 tablet, Oral, QHS PRN, Raiford Noble [place], DO, 1 tablet at 02/28/20 2217 .  senna-docusate (Senokot-S) tablet 1 tablet, 1 tablet, Oral, BID, Florencia Reasons, MD, 1 tablet at 03/03/20 1040 .  simethicone (MYLICON) chewable tablet 80 mg, 80 mg, Oral, QID PRN, Raiford Noble [place], DO, 80 mg at 03/02/20 9518 .  sodium chloride flush (NS) 0.9 % injection 3 mL, 3 mL, Intravenous, Once, Virgel Manifold, MD .  sodium zirconium cyclosilicate (LOKELMA) packet 10 g, 10 g, Oral, BID, Adhikari, Amrit, MD, 10 g at  03/03/20 1045  Patients Current Diet:  Diet Order            Diet Carb Modified Fluid consistency: Thin; Room service appropriate? Yes  Diet effective now                 Precautions / Restrictions Precautions Precautions: Back, Fall, Other (comment) Precaution Comments: Significant BLE weakness and ataxia-like movements (LLE>RLE); back and rib precautions for comfort due to fxs Restrictions Weight Bearing Restrictions: No   Has the patient had 2 or more falls or a fall with injury in the past year? 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 Independent  Indoor Mobility: Did the patient  need assistance with walking from room to room (with or without device)? Independent  Stairs: Did the patient need assistance with internal or external stairs (with or without device)? Independent  Functional Cognition: Did the patient need help planning regular tasks such as shopping or remembering to take medications? Independent  Home Assistive Devices / Equipment Home Assistive Devices/Equipment: None Home Equipment: None  Prior Device Use: Indicate devices/aids used by the patient prior to current illness, exacerbation or injury? None of the above  Current Functional Level Cognition  Overall Cognitive Status: Within Functional Limits for tasks assessed Orientation Level: Oriented X4 General Comments: pt able to understand basic [place], RN assisted with transfer with [place] and speaks Spanish    Extremity Assessment (includes Sensation/Coordination)  Upper Extremity Assessment: Generalized weakness  Lower Extremity Assessment: Generalized weakness RLE Sensation: WNL LLE Sensation: WNL    ADLs  Overall ADL's : Needs assistance/impaired Eating/Feeding: Minimal assistance, Bed level Grooming: Moderate assistance Grooming Details (indicate cue type and reason): pt requires external support to maintain unsupported sitting balance. He has tendency to heavily brace self with BUEs Upper Body Bathing: Minimal assistance, Sitting Lower Body Bathing: Moderate assistance, Sit to/from stand Upper Body Dressing : Minimal assistance, Sitting Lower Body Dressing: Maximal assistance, Sit to/from stand Lower Body Dressing Details (indicate cue type and reason): pt able to initiate figure 4 crossing but unabel to reach for LB dressing. Pt L LE is weaker than R LE Toilet Transfer: Maximal assistance Toilet Transfer Details (indicate cue type and reason): max A for sit <> stand in stedy frame. Stedy then placed over toilet with BSC over top for added leverage Toileting- Clothing Manipulation and  Hygiene: Total assistance, Sit to/from stand Toileting - Clothing Manipulation Details (indicate cue type and reason): pt requiring total A from OT for posterior peri care after BM while standing with BUE support in stedy frame Functional mobility during ADLs: Maximal assistance (to sit <> stand in stedy) General ADL Comments: pt had returned from radiation.  Pt agreed to gentle BUE AROM with yellow theraband.  Pt willing and seemed to enjoy exercises.    Mobility  Overal bed mobility: Needs Assistance Bed Mobility: Rolling, Sidelying to Sit, Sit to Supine Rolling: Min assist Sidelying to sit: Mod assist Supine to sit: Mod assist Sit to supine: Mod assist Sit to sidelying: Min assist General bed mobility comments: pt able to assist with rolling using upper body, assist for positioning LEs; assist for LEs over EOB and pt able to assist with trunk upright with hand assist from sidelying; pt returned to bed with assist for LEs (very fatigued and returning to bed prior to cues for log roll technique so assisted  with lower body)    Transfers  Overall transfer level: Needs assistance Equipment used: Ambulation equipment used Transfer via Lift Equipment: Stedy Transfers: Sit to/from Stand Sit to Stand: Max assist, +2 safety/equipment Stand pivot transfers: Total assist General transfer comment: pt assisted with sit to stands within [place] for assist to/from toilet (requested BM on [facility]), pt reliant on knees blocked and assisted with hip extension    Ambulation / Gait / Stairs / Wheelchair Mobility  Ambulation/Gait Ambulation/Gait assistance: Herbalist (Feet): 6 Feet Assistive device: Rolling walker (2 wheeled) Gait Pattern/deviations: Wide base of support General Gait Details: unable to safely attempt Gait velocity: decreased    Posture / Balance Dynamic Sitting Balance Sitting balance - Comments: pt able to briefly sit with hand on lap however pain in ribs also limiting so  required bil UE support, pt performed one UE support with other lifting to tap therapist's hand x10 Balance Overall balance assessment: Needs assistance Sitting-balance support: Feet supported, Bilateral upper extremity supported Sitting balance-Leahy Scale: Poor Sitting balance - Comments: pt able to briefly sit with hand on lap however pain in ribs also limiting so required bil UE support, pt performed one UE support with other lifting to tap therapist's hand x10 Standing balance support: Bilateral upper extremity supported, During functional activity Standing balance-Leahy Scale: Zero Standing balance comment: reliant on BUE support, use of stedy, and external assist from therapist    Special needs/care consideration Designated visitor to be Verdis Frederickson on admission Spanish speaker translator to be provided New Diagnosis of Type 2 Diabetes  Has completed 10 day course of radiation Planned chemo to be initiated which will likely be Velcade sq, dexamethasone and cytoxan oral weekly per oncology   Previous Home Environment  Living Arrangements: Spouse/significant other, Children (spouse and 74 year old daughter)  Lives With: Spouse, Daughter Available Help at Discharge:  (wife works Education administrator houses) Type of Home: [employer] Layout: One level Home Access: Stairs to enter Entrance Stairs-Rails: Building surveyor of Steps: 5 Bathroom Shower/Tub: Multimedia programmer: Standard Bathroom Accessibility: Yes How Accessible: Accessible via walker Home Care Services: No  Discharge Living Setting Plans for Discharge Living Setting: Patient's home, Lives with (comment) (wife and 26 year old daughter) Type of Home at Discharge: House Discharge Home Layout: One level Discharge Home Access: Stairs to enter Entrance Stairs-Rails: Right Entrance Stairs-Number of Steps: 5 Discharge Bathroom Shower/Tub: Walk-in shower Discharge Bathroom Toilet: Standard Discharge Bathroom  Accessibility: Yes How Accessible: Accessible via walker Does the patient have any problems obtaining your medications?: Yes (Describe) (uninsured)  Social/Family/Support Systems Patient Roles: Spouse, 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 Information: wife, Verdis Frederickson Anticipated Caregiver: wife and 47 year old daughter Anticipated Caregiver's Contact Information: see above Caregiver Availability: 24/7 Discharge Plan Discussed with Primary Caregiver: Yes Is Caregiver In Agreement with Plan?: Yes Does Caregiver/Family have Issues with Lodging/Transportation while Pt is in Rehab?: No  Goals Patient/Family Goal for Rehab: supervision to min asisst with PT and OT Expected length of stay: ELOS 2 to 3 weeks Cultural Considerations: Spanish speaker Pt/Family Agrees to Admission and willing to participate: Yes Program Orientation Provided & Reviewed with Pt/Caregiver Including Roles  & Responsibilities: Yes   Likely wheelchair level goals  Decrease burden of Care through IP rehab admission:n/a  Possible need for SNF placement upon discharge: not anticipated  Patient Condition: I have reviewed medical records from [facility] hospital and [facility] , spoken with CSW, and patient and spouse. I discussed via phone for inpatient  rehabilitation assessment.  Patient will benefit from ongoing PT and OT, can actively participate in 3 hours of therapy a day 5 days of the week, and can make measurable gains during the admission.  Patient will also benefit from the coordinated team approach during an Inpatient Acute Rehabilitation admission.  The patient will receive intensive therapy as well as Rehabilitation physician, nursing, social worker, and care management interventions.  Due to bladder management, bowel management, safety, skin/wound care, disease management, medication administration, pain management and patient education the patient requires 24 hour a day rehabilitation nursing.  The patient is currently  mod to max assist with mobility and basic ADLs.  Discharge setting and therapy post discharge at home with home health is anticipated.  Patient has agreed to participate in the Acute Inpatient Rehabilitation Program and will admit today.  Preadmission Screen Completed By:  Cleatrice Burke, 03/03/2020 11:18 AM ______________________________________________________________________   Discussed status with Dr. Dagoberto Ligas  on  03/03/2020 at  1118 and received approval for admission today.  Admission Coordinator:  Cleatrice Burke, RN, time  0109 Date 03/03/2020   Assessment/Plan: Diagnosis: 1. Does the need for close, 24 hr/day Medical supervision in concert with the patient's rehab needs make it unreasonable for this patient to be served in a less intensive setting? Yes 2. 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 Due to bladder management, bowel management, safety, skin/wound care, disease management, medication administration, pain management and patient education, does the patient require 24 hr/day rehab nursing? Yes 4. Does the patient require coordinated care of a physician, rehab nurse, PT, OT, and SLP to address physical and functional deficits in the context of the above medical diagnosis(es)? Yes Addressing deficits in the following areas: balance, endurance, locomotion, strength, transferring, bowel/bladder control, bathing, dressing, feeding, grooming and toileting 5. Can the patient actively participate in an intensive therapy program of at least 3 hrs of therapy 5 days a week? Yes 6. The potential for patient to make measurable gains while on inpatient rehab is good 7. Anticipated functional outcomes upon discharge from inpatient rehab: supervision and min assist PT, supervision and min assist OT, n/a SLP 8. Estimated rehab length of  stay to reach the above functional goals is: 2-3 weeks 9. Anticipated discharge destination: Home 10. Overall Rehab/Functional Prognosis: good   MD Signature: **

## 2020-03-02 NOTE — Progress Notes (Addendum)
Inpatient Diabetes Program Recommendations  AACE/ADA: New Consensus Statement on Inpatient Glycemic Control (2015)  Target Ranges:  Prepandial:   less than 140 mg/dL      Peak postprandial:   less than 180 mg/dL (1-2 hours)      Critically ill patients:  140 - 180 mg/dL   Lab Results  Component Value Date   GLUCAP 283 (H) 03/02/2020   HGBA1C 6.5 (H) 02/18/2020    Review of Glycemic Control Results for Trevor Shelton, Trevor Shelton (MRN [id number]) as of 03/02/2020 09:08  Ref. Range 03/01/2020 07:25 03/01/2020 11:30 03/01/2020 17:22 03/01/2020 21:04 03/02/2020 07:44  Glucose-Capillary Latest Ref Range: 70 - 99 mg/dL 183 (H) 264 (H) 270 (H) 353 (H) 283 (H)    Diabetes history: Pre DM, New DM, 6.5 A1c    Current orders for Inpatient glycemic control:  Lantus 5 units qhs Novolog 0-9 units tid Novolog 3 units tid meal coverage  Ensure Enlive tid between meals Decadron 4 mg Q8 hours  Inpatient Diabetes Program Recommendations:    Note Meal coverage Novolog insulin being started at lunchtime today.  -  Consider increasing Lantus to 10 units  -  Add Novolog hs scale   Due to decadron pt needing insulin for now. Unsure how long decadron treatment will be with treatments. May can transition to oral medications closer to d/c.  Addendum 1155 am: Spoke with pt and wife at bedside via interpreter Trevor Shelton [phone]) regarding A1c level, steroids, insulin use in hospital, and future planning of glucose control.  Discussed A1c level. Pt reports father and brother both have DM. Pt has been pre DM in the past. Pt's wife has DM. Discussed glucose goals. Discussed why we are checking glucose, giving insulin and discussed decadron medication, future treatments. Discussed how appetite may be effected to I would not necessarily limit what he eats at this point.   Told pt and wife we would monitor glucose trends for now until closer to d/c and will see what would be appropriate to go home on.  Thanks,  Trevor Headings  RN, MSN, BC-ADM Inpatient Diabetes Coordinator Team Pager [phone] (8a-5p)

## 2020-03-03 ENCOUNTER — Inpatient Hospital Stay: Payer: Self-pay | Admitting: Hematology and Oncology

## 2020-03-03 ENCOUNTER — Inpatient Hospital Stay (HOSPITAL_COMMUNITY): Payer: Self-pay

## 2020-03-03 ENCOUNTER — Encounter (HOSPITAL_COMMUNITY): Payer: Self-pay | Admitting: Physical Medicine and Rehabilitation

## 2020-03-03 ENCOUNTER — Inpatient Hospital Stay (HOSPITAL_COMMUNITY)
Admission: RE | Admit: 2020-03-03 | Discharge: 2020-03-19 | DRG: 052 | Disposition: A | Discharge status: Other Acute Inpatient Hospital | Payer: Self-pay | Source: Intra-hospital | Attending: Physical Medicine and Rehabilitation | Admitting: Physical Medicine and Rehabilitation

## 2020-03-03 ENCOUNTER — Inpatient Hospital Stay: Payer: Self-pay

## 2020-03-03 ENCOUNTER — Encounter (HOSPITAL_COMMUNITY): Payer: Self-pay | Admitting: Internal Medicine

## 2020-03-03 ENCOUNTER — Other Ambulatory Visit: Payer: Self-pay

## 2020-03-03 DIAGNOSIS — C7951 Secondary malignant neoplasm of bone: Secondary | ICD-10-CM | POA: Diagnosis present

## 2020-03-03 DIAGNOSIS — L8990 Pressure ulcer of unspecified site, unspecified stage: Secondary | ICD-10-CM | POA: Insufficient documentation

## 2020-03-03 DIAGNOSIS — I10 Essential (primary) hypertension: Secondary | ICD-10-CM | POA: Diagnosis present

## 2020-03-03 DIAGNOSIS — L89322 Pressure ulcer of left buttock, stage 2: Secondary | ICD-10-CM | POA: Diagnosis not present

## 2020-03-03 DIAGNOSIS — Z713 Dietary counseling and surveillance: Secondary | ICD-10-CM

## 2020-03-03 DIAGNOSIS — Z923 Personal history of irradiation: Secondary | ICD-10-CM

## 2020-03-03 DIAGNOSIS — Z833 Family history of diabetes mellitus: Secondary | ICD-10-CM

## 2020-03-03 DIAGNOSIS — E785 Hyperlipidemia, unspecified: Secondary | ICD-10-CM | POA: Diagnosis present

## 2020-03-03 DIAGNOSIS — R112 Nausea with vomiting, unspecified: Secondary | ICD-10-CM

## 2020-03-03 DIAGNOSIS — B370 Candidal stomatitis: Secondary | ICD-10-CM | POA: Diagnosis not present

## 2020-03-03 DIAGNOSIS — W11XXXD Fall on and from ladder, subsequent encounter: Secondary | ICD-10-CM | POA: Diagnosis present

## 2020-03-03 DIAGNOSIS — K2100 Gastro-esophageal reflux disease with esophagitis, without bleeding: Secondary | ICD-10-CM | POA: Diagnosis present

## 2020-03-03 DIAGNOSIS — M8458XD Pathological fracture in neoplastic disease, other specified site, subsequent encounter for fracture with routine healing: Secondary | ICD-10-CM | POA: Diagnosis present

## 2020-03-03 DIAGNOSIS — M25511 Pain in right shoulder: Secondary | ICD-10-CM | POA: Diagnosis not present

## 2020-03-03 DIAGNOSIS — E876 Hypokalemia: Secondary | ICD-10-CM | POA: Diagnosis present

## 2020-03-03 DIAGNOSIS — T451X5A Adverse effect of antineoplastic and immunosuppressive drugs, initial encounter: Secondary | ICD-10-CM | POA: Diagnosis not present

## 2020-03-03 DIAGNOSIS — Z20822 Contact with and (suspected) exposure to covid-19: Secondary | ICD-10-CM | POA: Diagnosis present

## 2020-03-03 DIAGNOSIS — G893 Neoplasm related pain (acute) (chronic): Secondary | ICD-10-CM | POA: Diagnosis present

## 2020-03-03 DIAGNOSIS — E8809 Other disorders of plasma-protein metabolism, not elsewhere classified: Secondary | ICD-10-CM | POA: Diagnosis not present

## 2020-03-03 DIAGNOSIS — Z820 Family history of epilepsy and other diseases of the nervous system: Secondary | ICD-10-CM

## 2020-03-03 DIAGNOSIS — K560 Paralytic ileus: Secondary | ICD-10-CM | POA: Diagnosis present

## 2020-03-03 DIAGNOSIS — A419 Sepsis, unspecified organism: Secondary | ICD-10-CM | POA: Diagnosis not present

## 2020-03-03 DIAGNOSIS — E875 Hyperkalemia: Secondary | ICD-10-CM | POA: Diagnosis present

## 2020-03-03 DIAGNOSIS — J189 Pneumonia, unspecified organism: Secondary | ICD-10-CM | POA: Diagnosis not present

## 2020-03-03 DIAGNOSIS — D892 Hypergammaglobulinemia, unspecified: Secondary | ICD-10-CM | POA: Diagnosis present

## 2020-03-03 DIAGNOSIS — G4700 Insomnia, unspecified: Secondary | ICD-10-CM | POA: Diagnosis present

## 2020-03-03 DIAGNOSIS — R140 Abdominal distension (gaseous): Secondary | ICD-10-CM

## 2020-03-03 DIAGNOSIS — E860 Dehydration: Secondary | ICD-10-CM | POA: Diagnosis present

## 2020-03-03 DIAGNOSIS — Z23 Encounter for immunization: Secondary | ICD-10-CM

## 2020-03-03 DIAGNOSIS — T380X5A Adverse effect of glucocorticoids and synthetic analogues, initial encounter: Secondary | ICD-10-CM | POA: Diagnosis not present

## 2020-03-03 DIAGNOSIS — E871 Hypo-osmolality and hyponatremia: Secondary | ICD-10-CM | POA: Diagnosis present

## 2020-03-03 DIAGNOSIS — R0602 Shortness of breath: Secondary | ICD-10-CM

## 2020-03-03 DIAGNOSIS — M549 Dorsalgia, unspecified: Secondary | ICD-10-CM | POA: Diagnosis present

## 2020-03-03 DIAGNOSIS — G8222 Paraplegia, incomplete: Principal | ICD-10-CM | POA: Diagnosis present

## 2020-03-03 DIAGNOSIS — R4182 Altered mental status, unspecified: Secondary | ICD-10-CM | POA: Diagnosis present

## 2020-03-03 DIAGNOSIS — R066 Hiccough: Secondary | ICD-10-CM | POA: Diagnosis present

## 2020-03-03 DIAGNOSIS — R509 Fever, unspecified: Secondary | ICD-10-CM

## 2020-03-03 DIAGNOSIS — D696 Thrombocytopenia, unspecified: Secondary | ICD-10-CM | POA: Diagnosis not present

## 2020-03-03 DIAGNOSIS — E119 Type 2 diabetes mellitus without complications: Secondary | ICD-10-CM

## 2020-03-03 DIAGNOSIS — Y92239 Unspecified place in hospital as the place of occurrence of the external cause: Secondary | ICD-10-CM | POA: Diagnosis not present

## 2020-03-03 DIAGNOSIS — Z79899 Other long term (current) drug therapy: Secondary | ICD-10-CM

## 2020-03-03 DIAGNOSIS — E11649 Type 2 diabetes mellitus with hypoglycemia without coma: Secondary | ICD-10-CM | POA: Diagnosis not present

## 2020-03-03 DIAGNOSIS — E1140 Type 2 diabetes mellitus with diabetic neuropathy, unspecified: Secondary | ICD-10-CM | POA: Diagnosis present

## 2020-03-03 DIAGNOSIS — N179 Acute kidney failure, unspecified: Secondary | ICD-10-CM | POA: Diagnosis present

## 2020-03-03 DIAGNOSIS — G9529 Other cord compression: Secondary | ICD-10-CM | POA: Diagnosis present

## 2020-03-03 DIAGNOSIS — L89312 Pressure ulcer of right buttock, stage 2: Secondary | ICD-10-CM | POA: Diagnosis not present

## 2020-03-03 DIAGNOSIS — R109 Unspecified abdominal pain: Secondary | ICD-10-CM

## 2020-03-03 DIAGNOSIS — R000 Tachycardia, unspecified: Secondary | ICD-10-CM | POA: Diagnosis not present

## 2020-03-03 DIAGNOSIS — N400 Enlarged prostate without lower urinary tract symptoms: Secondary | ICD-10-CM | POA: Diagnosis present

## 2020-03-03 DIAGNOSIS — M21372 Foot drop, left foot: Secondary | ICD-10-CM | POA: Diagnosis present

## 2020-03-03 DIAGNOSIS — E7800 Pure hypercholesterolemia, unspecified: Secondary | ICD-10-CM | POA: Diagnosis present

## 2020-03-03 DIAGNOSIS — M899 Disorder of bone, unspecified: Secondary | ICD-10-CM | POA: Diagnosis present

## 2020-03-03 DIAGNOSIS — D61818 Other pancytopenia: Secondary | ICD-10-CM | POA: Diagnosis not present

## 2020-03-03 DIAGNOSIS — Z09 Encounter for follow-up examination after completed treatment for conditions other than malignant neoplasm: Secondary | ICD-10-CM

## 2020-03-03 DIAGNOSIS — G9520 Unspecified cord compression: Secondary | ICD-10-CM | POA: Diagnosis present

## 2020-03-03 DIAGNOSIS — R070 Pain in throat: Secondary | ICD-10-CM | POA: Diagnosis not present

## 2020-03-03 DIAGNOSIS — K1230 Oral mucositis (ulcerative), unspecified: Secondary | ICD-10-CM | POA: Diagnosis present

## 2020-03-03 DIAGNOSIS — G903 Multi-system degeneration of the autonomic nervous system: Secondary | ICD-10-CM | POA: Diagnosis not present

## 2020-03-03 DIAGNOSIS — E1165 Type 2 diabetes mellitus with hyperglycemia: Secondary | ICD-10-CM | POA: Diagnosis present

## 2020-03-03 DIAGNOSIS — R040 Epistaxis: Secondary | ICD-10-CM | POA: Diagnosis not present

## 2020-03-03 DIAGNOSIS — C9000 Multiple myeloma not having achieved remission: Secondary | ICD-10-CM | POA: Diagnosis present

## 2020-03-03 DIAGNOSIS — K5900 Constipation, unspecified: Secondary | ICD-10-CM | POA: Diagnosis present

## 2020-03-03 DIAGNOSIS — S2242XD Multiple fractures of ribs, left side, subsequent encounter for fracture with routine healing: Secondary | ICD-10-CM

## 2020-03-03 DIAGNOSIS — D62 Acute posthemorrhagic anemia: Secondary | ICD-10-CM | POA: Diagnosis not present

## 2020-03-03 LAB — BASIC METABOLIC PANEL
Anion gap: 11 (ref 5–15)
BUN: 62 mg/dL — ABNORMAL HIGH (ref 6–20)
CO2: 22 mmol/L (ref 22–32)
Calcium: 11.7 mg/dL — ABNORMAL HIGH (ref 8.9–10.3)
Chloride: 90 mmol/L — ABNORMAL LOW (ref 98–111)
Creatinine, Ser: 1.49 mg/dL — ABNORMAL HIGH (ref 0.61–1.24)
GFR calc Af Amer: 60 mL/min — ABNORMAL LOW (ref >60)
GFR calc non Af Amer: 52 mL/min — ABNORMAL LOW (ref >60)
Glucose, Bld: 200 mg/dL — ABNORMAL HIGH (ref 70–99)
Potassium: 4.6 mmol/L (ref 3.5–5.1)
Sodium: 123 mmol/L — ABNORMAL LOW (ref 135–145)

## 2020-03-03 LAB — GLUCOSE, CAPILLARY
Glucose-Capillary: 188 mg/dL — ABNORMAL HIGH (ref 70–99)
Glucose-Capillary: 323 mg/dL — ABNORMAL HIGH (ref 70–99)
Glucose-Capillary: 254 mg/dL — ABNORMAL HIGH (ref 70–99)
Glucose-Capillary: 168 mg/dL — ABNORMAL HIGH (ref 70–99)

## 2020-03-03 MED ORDER — PROCHLORPERAZINE EDISYLATE 10 MG/2ML IJ SOLN
5.0000 mg | Freq: Four times a day (QID) | INTRAMUSCULAR | Status: DC | PRN
  Administered 2020-03-08 – 2020-03-10 (×2): 10 mg via INTRAMUSCULAR
  Filled 2020-03-03 (×3): qty 2

## 2020-03-03 MED ORDER — SODIUM ZIRCONIUM CYCLOSILICATE 10 G PO PACK
10.0000 g | PACK | Freq: Two times a day (BID) | ORAL | Status: DC
  Administered 2020-03-03: 10 g via ORAL
  Filled 2020-03-03 (×2): qty 1

## 2020-03-03 MED ORDER — FLEET ENEMA 7-19 GM/118ML RE ENEM: 1.0000 | ENEMA | Freq: Once | RECTAL | Status: DC | PRN

## 2020-03-03 MED ORDER — CHLORHEXIDINE GLUCONATE CLOTH 2 % EX PADS: 6.0000 | MEDICATED_PAD | Freq: Every day | CUTANEOUS | Status: DC

## 2020-03-03 MED ORDER — ENSURE ENLIVE PO LIQD: 237.0000 mL | Freq: Three times a day (TID) | ORAL | 12 refills | Status: AC

## 2020-03-03 MED ORDER — INSULIN ASPART 100 UNIT/ML ~~LOC~~ SOLN
3.0000 [IU] | Freq: Three times a day (TID) | SUBCUTANEOUS | Status: DC
  Administered 2020-03-03 – 2020-03-19 (×50): 3 [IU] via SUBCUTANEOUS

## 2020-03-03 MED ORDER — PANTOPRAZOLE SODIUM 40 MG PO TBEC
40.0000 mg | DELAYED_RELEASE_TABLET | Freq: Every day | ORAL | Status: DC
  Administered 2020-03-04 – 2020-03-10 (×7): 40 mg via ORAL
  Filled 2020-03-03 (×7): qty 1

## 2020-03-03 MED ORDER — AMLODIPINE BESYLATE 10 MG PO TABS
10.0000 mg | ORAL_TABLET | Freq: Every day | ORAL | Status: DC
  Administered 2020-03-04 – 2020-03-19 (×16): 10 mg via ORAL
  Filled 2020-03-03 (×16): qty 1

## 2020-03-03 MED ORDER — INSULIN GLARGINE 100 UNIT/ML ~~LOC~~ SOLN: 10.0000 [IU] | Freq: Every day | SUBCUTANEOUS | 11 refills | Status: AC

## 2020-03-03 MED ORDER — SENNOSIDES-DOCUSATE SODIUM 8.6-50 MG PO TABS
2.0000 | ORAL_TABLET | Freq: Every day | ORAL | Status: DC
  Administered 2020-03-04 – 2020-03-12 (×9): 2 via ORAL
  Filled 2020-03-03 (×9): qty 2

## 2020-03-03 MED ORDER — ATORVASTATIN CALCIUM 10 MG PO TABS
20.0000 mg | ORAL_TABLET | Freq: Every day | ORAL | Status: DC
  Administered 2020-03-03 – 2020-03-19 (×17): 20 mg via ORAL
  Filled 2020-03-03 (×18): qty 2

## 2020-03-03 MED ORDER — POLYETHYLENE GLYCOL 3350 17 G PO PACK: 17.0000 g | PACK | Freq: Every day | ORAL | 0 refills | Status: AC | PRN

## 2020-03-03 MED ORDER — CHLORPROMAZINE HCL 25 MG PO TABS
25.0000 mg | ORAL_TABLET | Freq: Four times a day (QID) | ORAL | Status: DC | PRN
  Administered 2020-03-04 – 2020-03-06 (×3): 25 mg via ORAL
  Filled 2020-03-03 (×5): qty 1

## 2020-03-03 MED ORDER — SODIUM CHLORIDE 0.9% FLUSH
10.0000 mL | Freq: Two times a day (BID) | INTRAVENOUS | Status: DC
  Administered 2020-03-03 – 2020-03-08 (×8): 10 mL

## 2020-03-03 MED ORDER — DEXAMETHASONE 4 MG PO TABS: 4.0000 mg | ORAL_TABLET | Freq: Three times a day (TID) | ORAL | 0 refills | Status: AC

## 2020-03-03 MED ORDER — ENOXAPARIN SODIUM 40 MG/0.4ML ~~LOC~~ SOLN
40.0000 mg | SUBCUTANEOUS | Status: DC
  Administered 2020-03-03 – 2020-03-14 (×12): 40 mg via SUBCUTANEOUS
  Filled 2020-03-03 (×12): qty 0.4

## 2020-03-03 MED ORDER — ENSURE MAX PROTEIN PO LIQD
11.0000 [oz_av] | Freq: Two times a day (BID) | ORAL | Status: DC
  Administered 2020-03-03 – 2020-03-19 (×27): 11 [oz_av] via ORAL
  Filled 2020-03-03 (×33): qty 330

## 2020-03-03 MED ORDER — ALUM & MAG HYDROXIDE-SIMETH 200-200-20 MG/5ML PO SUSP
30.0000 mL | ORAL | Status: DC | PRN
  Administered 2020-03-04 – 2020-03-12 (×5): 30 mL via ORAL
  Filled 2020-03-03 (×5): qty 30

## 2020-03-03 MED ORDER — DIPHENHYDRAMINE HCL 12.5 MG/5ML PO ELIX: 12.5000 mg | ORAL_SOLUTION | Freq: Four times a day (QID) | ORAL | Status: DC | PRN

## 2020-03-03 MED ORDER — BISACODYL 10 MG RE SUPP: 10.0000 mg | Freq: Every day | RECTAL | Status: DC | PRN

## 2020-03-03 MED ORDER — SODIUM CHLORIDE 0.9% FLUSH: 10.0000 mL | INTRAVENOUS | Status: DC | PRN

## 2020-03-03 MED ORDER — LIDOCAINE VISCOUS HCL 2 % MT SOLN: 15.0000 mL | Freq: Four times a day (QID) | OROMUCOSAL | 0 refills | Status: AC | PRN

## 2020-03-03 MED ORDER — LIDOCAINE HCL URETHRAL/MUCOSAL 2 % EX GEL: CUTANEOUS | Status: DC | PRN

## 2020-03-03 MED ORDER — OXYCODONE HCL 5 MG PO TABS: 5.0000 mg | ORAL_TABLET | ORAL | 0 refills | Status: AC | PRN

## 2020-03-03 MED ORDER — INSULIN ASPART 100 UNIT/ML ~~LOC~~ SOLN
0.0000 [IU] | Freq: Three times a day (TID) | SUBCUTANEOUS | 11 refills | Status: AC
Start: 2020-03-03 — End: ?

## 2020-03-03 MED ORDER — OXYCODONE HCL 5 MG PO TABS
5.0000 mg | ORAL_TABLET | ORAL | Status: DC | PRN
  Administered 2020-03-03 – 2020-03-05 (×7): 10 mg via ORAL
  Administered 2020-03-06: 5 mg via ORAL
  Administered 2020-03-06 – 2020-03-17 (×12): 10 mg via ORAL
  Administered 2020-03-19 (×2): 5 mg via ORAL
  Filled 2020-03-03 (×7): qty 2
  Filled 2020-03-03: qty 1
  Filled 2020-03-03 (×9): qty 2
  Filled 2020-03-03: qty 1
  Filled 2020-03-03 (×3): qty 2

## 2020-03-03 MED ORDER — POLYETHYLENE GLYCOL 3350 17 G PO PACK: 17.0000 g | PACK | Freq: Every day | ORAL | Status: DC | PRN

## 2020-03-03 MED ORDER — TRAMADOL HCL 50 MG PO TABS
50.0000 mg | ORAL_TABLET | ORAL | Status: DC | PRN
  Administered 2020-03-07 – 2020-03-09 (×5): 50 mg via ORAL
  Filled 2020-03-03 (×5): qty 1

## 2020-03-03 MED ORDER — LIDOCAINE VISCOUS HCL 2 % MT SOLN
15.0000 mL | Freq: Four times a day (QID) | OROMUCOSAL | Status: DC | PRN
  Filled 2020-03-03: qty 15

## 2020-03-03 MED ORDER — DEXAMETHASONE SODIUM PHOSPHATE 4 MG/ML IJ SOLN
4.0000 mg | Freq: Three times a day (TID) | INTRAMUSCULAR | Status: DC
  Administered 2020-03-03 – 2020-03-07 (×12): 4 mg via INTRAVENOUS
  Filled 2020-03-03 (×12): qty 1

## 2020-03-03 MED ORDER — ACETAMINOPHEN 325 MG PO TABS
325.0000 mg | ORAL_TABLET | ORAL | Status: DC | PRN
  Administered 2020-03-05 – 2020-03-19 (×3): 650 mg via ORAL
  Filled 2020-03-03 (×4): qty 2

## 2020-03-03 MED ORDER — TRAZODONE HCL 50 MG PO TABS
25.0000 mg | ORAL_TABLET | Freq: Every evening | ORAL | Status: DC | PRN
  Administered 2020-03-05: 25 mg via ORAL
  Filled 2020-03-03: qty 1

## 2020-03-03 MED ORDER — PNEUMOCOCCAL VAC POLYVALENT 25 MCG/0.5ML IJ INJ
0.5000 mL | INJECTION | INTRAMUSCULAR | Status: AC
  Administered 2020-03-04: 0.5 mL via INTRAMUSCULAR
  Filled 2020-03-03: qty 0.5

## 2020-03-03 MED ORDER — CLONAZEPAM 0.25 MG PO TBDP
0.2500 mg | ORAL_TABLET | Freq: Three times a day (TID) | ORAL | Status: DC | PRN
  Administered 2020-03-10: 0.25 mg via ORAL
  Filled 2020-03-03: qty 1

## 2020-03-03 MED ORDER — PROCHLORPERAZINE MALEATE 5 MG PO TABS
5.0000 mg | ORAL_TABLET | Freq: Four times a day (QID) | ORAL | Status: DC | PRN
  Administered 2020-03-03 – 2020-03-10 (×3): 10 mg via ORAL
  Administered 2020-03-10: 5 mg via ORAL
  Administered 2020-03-11 – 2020-03-12 (×3): 10 mg via ORAL
  Administered 2020-03-13: 5 mg via ORAL
  Administered 2020-03-15: 10 mg via ORAL
  Administered 2020-03-15: 5 mg via ORAL
  Administered 2020-03-16: 10 mg via ORAL
  Filled 2020-03-03: qty 1
  Filled 2020-03-03 (×10): qty 2

## 2020-03-03 MED ORDER — CARVEDILOL 3.125 MG PO TABS
3.1250 mg | ORAL_TABLET | Freq: Two times a day (BID) | ORAL | Status: DC
  Administered 2020-03-03 – 2020-03-19 (×33): 3.125 mg via ORAL
  Filled 2020-03-03 (×33): qty 1

## 2020-03-03 MED ORDER — PANTOPRAZOLE SODIUM 40 MG PO TBEC: 40.0000 mg | DELAYED_RELEASE_TABLET | Freq: Every day | ORAL | Status: AC

## 2020-03-03 MED ORDER — LIDOCAINE 5 % EX PTCH: 1.0000 | MEDICATED_PATCH | CUTANEOUS | 0 refills | Status: AC

## 2020-03-03 MED ORDER — CARVEDILOL 3.125 MG PO TABS: 3.1250 mg | ORAL_TABLET | Freq: Two times a day (BID) | ORAL | Status: AC

## 2020-03-03 MED ORDER — AMLODIPINE BESYLATE 10 MG PO TABS: 10.0000 mg | ORAL_TABLET | Freq: Every day | ORAL | Status: AC

## 2020-03-03 MED ORDER — INSULIN GLARGINE 100 UNIT/ML ~~LOC~~ SOLN
10.0000 [IU] | Freq: Every day | SUBCUTANEOUS | Status: DC
  Administered 2020-03-03 – 2020-03-04 (×2): 10 [IU] via SUBCUTANEOUS
  Filled 2020-03-03 (×3): qty 0.1

## 2020-03-03 MED ORDER — PROSOURCE PLUS PO LIQD
30.0000 mL | Freq: Two times a day (BID) | ORAL | Status: DC
  Administered 2020-03-03 – 2020-03-19 (×32): 30 mL via ORAL
  Filled 2020-03-03 (×33): qty 30

## 2020-03-03 MED ORDER — LIDOCAINE 5 % EX PTCH
1.0000 | MEDICATED_PATCH | CUTANEOUS | Status: DC
  Administered 2020-03-03 – 2020-03-09 (×7): 1 via TRANSDERMAL
  Filled 2020-03-03 (×7): qty 1

## 2020-03-03 MED ORDER — INSULIN ASPART 100 UNIT/ML ~~LOC~~ SOLN: 3.0000 [IU] | Freq: Three times a day (TID) | SUBCUTANEOUS | 11 refills | Status: AC

## 2020-03-03 MED ORDER — POLYETHYLENE GLYCOL 3350 17 G PO PACK
17.0000 g | PACK | Freq: Every day | ORAL | Status: DC
  Administered 2020-03-04 – 2020-03-19 (×16): 17 g via ORAL
  Filled 2020-03-03 (×15): qty 1

## 2020-03-03 MED ORDER — GUAIFENESIN-DM 100-10 MG/5ML PO SYRP: 5.0000 mL | ORAL_SOLUTION | Freq: Four times a day (QID) | ORAL | Status: DC | PRN

## 2020-03-03 MED ORDER — PHENOL 1.4 % MT LIQD
1.0000 | OROMUCOSAL | Status: DC | PRN
  Administered 2020-03-15: 1 via OROMUCOSAL
  Filled 2020-03-03: qty 177

## 2020-03-03 MED ORDER — PROCHLORPERAZINE 25 MG RE SUPP: 12.5000 mg | Freq: Four times a day (QID) | RECTAL | Status: DC | PRN

## 2020-03-03 MED ORDER — SIMETHICONE 80 MG PO CHEW
80.0000 mg | CHEWABLE_TABLET | Freq: Four times a day (QID) | ORAL | Status: DC | PRN
  Administered 2020-03-05 – 2020-03-12 (×3): 80 mg via ORAL
  Filled 2020-03-03 (×3): qty 1

## 2020-03-03 MED ORDER — INSULIN ASPART 100 UNIT/ML ~~LOC~~ SOLN
0.0000 [IU] | Freq: Three times a day (TID) | SUBCUTANEOUS | Status: DC
  Administered 2020-03-03: 7 [IU] via SUBCUTANEOUS
  Administered 2020-03-03: 5 [IU] via SUBCUTANEOUS
  Administered 2020-03-04 (×2): 2 [IU] via SUBCUTANEOUS
  Administered 2020-03-04: 5 [IU] via SUBCUTANEOUS
  Administered 2020-03-05: 8 [IU] via SUBCUTANEOUS
  Administered 2020-03-05: 2 [IU] via SUBCUTANEOUS
  Administered 2020-03-05: 5 [IU] via SUBCUTANEOUS
  Administered 2020-03-06: 2 [IU] via SUBCUTANEOUS
  Administered 2020-03-06 – 2020-03-07 (×3): 3 [IU] via SUBCUTANEOUS
  Administered 2020-03-07: 2 [IU] via SUBCUTANEOUS
  Administered 2020-03-07: 7 [IU] via SUBCUTANEOUS
  Administered 2020-03-08 (×3): 3 [IU] via SUBCUTANEOUS
  Administered 2020-03-09: 5 [IU] via SUBCUTANEOUS
  Administered 2020-03-09: 3 [IU] via SUBCUTANEOUS
  Administered 2020-03-09 – 2020-03-10 (×2): 5 [IU] via SUBCUTANEOUS
  Administered 2020-03-10: 3 [IU] via SUBCUTANEOUS
  Administered 2020-03-10: 5 [IU] via SUBCUTANEOUS
  Administered 2020-03-11: 3 [IU] via SUBCUTANEOUS
  Administered 2020-03-11: 5 [IU] via SUBCUTANEOUS
  Administered 2020-03-11: 7 [IU] via SUBCUTANEOUS
  Administered 2020-03-12: 5 [IU] via SUBCUTANEOUS
  Administered 2020-03-12: 3 [IU] via SUBCUTANEOUS
  Administered 2020-03-12 – 2020-03-13 (×2): 5 [IU] via SUBCUTANEOUS
  Administered 2020-03-13: 3 [IU] via SUBCUTANEOUS
  Administered 2020-03-13: 6 [IU] via SUBCUTANEOUS
  Administered 2020-03-14: 5 [IU] via SUBCUTANEOUS
  Administered 2020-03-14 (×2): 7 [IU] via SUBCUTANEOUS
  Administered 2020-03-15 (×2): 3 [IU] via SUBCUTANEOUS
  Administered 2020-03-15 – 2020-03-17 (×5): 5 [IU] via SUBCUTANEOUS
  Administered 2020-03-17 – 2020-03-18 (×3): 2 [IU] via SUBCUTANEOUS
  Administered 2020-03-18: 3 [IU] via SUBCUTANEOUS
  Administered 2020-03-18: 5 [IU] via SUBCUTANEOUS
  Administered 2020-03-19: 2 [IU] via SUBCUTANEOUS
  Administered 2020-03-19: 1 [IU] via SUBCUTANEOUS
  Administered 2020-03-19: 6 [IU] via SUBCUTANEOUS

## 2020-03-03 NOTE — TOC Transition Note (Signed)
Transition of Care [facility]) - CM/SW Discharge Note   Patient Details  Name: Trevor Shelton MRN: [id number] Date of Birth: 16-Mar-1966  [facility] of [facility] CM/SW Contact:  Shade Flood, LCSW Phone Number: 03/03/2020, 11:56 AM   Clinical Narrative:     [facility] has a bed for pt today and pt is stable for dc per MD. [facility] Admissions RN called [facility] for transport. Transport packet prepared and left in RN station.   There are no other TOC needs for dc.  Final next level of care: IP Rehab Facility Barriers to Discharge: Barriers Resolved   Patient Goals and CMS Choice        Discharge Placement                       Discharge Plan and Services In-house Referral: Clinical Social Work   Post Acute Care Choice: IP Rehab                               Social Determinants of Health (SDOH) Interventions     Readmission Risk Interventions Readmission Risk Prevention Plan 02/29/2020  Transportation Screening Complete  Medication Review Press photographer) Complete  Some recent data might be hidden

## 2020-03-03 NOTE — Discharge Summary (Signed)
Physician Discharge Summary  Trevor Shelton GMW:[id number] DOB: 12-21-1965 DOA: 02/14/2020  PCP: Patient, No Pcp Per  Admit date: 02/14/2020 Discharge date: 03/03/2020  Admitted From: Home Disposition:  Home  Discharge Condition:Stable CODE STATUS:FULL Diet recommendation: Heart Healthy  Brief/Interim Summary:  Patient is a 54 y.o.Hispanic male withno significant past medical history who presented to [facility] ER on 02/14/20 initially with generalized weakness, left rib pain, lower back pain, constipation for about 1 month. Patient had fallen off a ladder approximately 1 month prior to admission, landing on his left side which was then followed by back pain, rib pain, and bilateral lower extremity weakness/numbness/tingling. He had previously been evaluated in the ER on 01/26/2020 after his fall, undergoing multiple x-rays to his spine and ribs. There were no acute abnormalities appreciated on imaging and he was discharged home. He had followed up also with orthopedics.  He again had presented back to the ER as noted above on 02/14/2020 with ongoing lower extremity weakness and pain throughout. He then underwent CT chest/abdomen/pelvis which then revealed multiple lytic lesions throughout the skeleton as well as acute left rib fractures involving ninth and 10th ribs. There was concern for multiple myeloma and workup was initiated along with oncology consultation. He also underwent CT-guided bone marrow biopsy/aspiration on 02/19/20.  SPEP did reveal an M spike. He was also followed by neurosurgery and has been initiated on Decadron. Radiation oncology also has been involved and he has been started on XRT with plans to complete a 10-day course. Bone marrow biopsy did return positive for neoplasm kappa light chain consistent with multiple myeloma. Plan is to initiate chemo once XRT is completed. He also developed progressive renal failure and was followed by nephrology. He was started on RRT due to uremic  signs. He was started on dialysis on 02/17/2020 and required 2 sessions (8/11, 8/13). Renal function then improved and he has not required further dialysis since. Dialysis catheter removed.  He finished 10 sessions of radiation therapy.  Now the plan is to initiate chemotherapy as an outpatient.  He is hemodynamically stable for discharge to CIR today.  Following problems were addressed during his hospitalization:  Hypercalcemia: Most likely secondary to underlying multiple myeloma.  Treated with pamidronate on 8/10 and was also given few days of calcitonin.  IV fluids have been stopped.  Calcium is 11.7 today  AKI: Creatinine of 4.94 on admission.  Nephrology was following.  Received CRRT twice while at [facility].  Currently renal function ihas improved with creatinine less than 2.  Urine output and renal function appear to have recovered.  Nephrology has signed off.  Kappa light chain myeloma: Imaging showed multiple spinal lytic lesions, left rib fracture on ninth and 10th, cord compression.  Currently on radiation therapy as per radiation oncology.  SPEP showed M spike and bone marrow biopsy on 8/13 shows 2 of kappa light chain myeloma.  Oncology and radiation oncology following.  Neurosurgery was also following at [facility].  Currently on Decadron for cord compression.  No plan for surgical intervention.  Started on radiation therapy for T5-T9 area/ left ribs. Plan is for initiating chemotherapy  and he completed 10 days of radiation therapy.We will change Decadron to oral on discharge to CIR.  Pathological fractures: Secondary to multiple myeloma.  Continue supportive care, pain control.  Hyponatremia/hyperkalemia: Hyponatremia is suspected to be from pseudohyponatremia from hyperproteinemia.  He has persistent mild hyperkalemia.  Started on Lokelma BID iwht improvement .  Continue to monitor BMP  every  other day.  Please call nephrology for consultation if sodium continues to  worsen.  Leukocytosis: Resolved  Elevated liver enzymes: Negative hepatitis panel, CT abdomen/pelvis unremarkable.    Significantly improved.  Diabetes type 2: New diagnosis.  Hemoglobin A1c 6.5.    Continue sliding-scale insulin for now,lantus because he is on decadron.  Might consider oral antihyperglycemics on discharge from CIR.  HTN: Currently on amlodipine and Coreg.  Monitor blood pressure  Constipation: Continue bowel regimen  Mouth pain: No finding of oral thrush.  Continue supportive care, Viscous Lidocaine as needed, Magic mouthwash.Now improved  Loss of appetite: Dietitian was following.   Discharge Diagnoses:  Principal Problem:   Kappa light chain myeloma ([place]) Active Problems:   Hypercalcemia   AKI (acute kidney injury) ([place])   Pathologic fracture   Hyponatremia   Elevated liver enzymes   Diabetes mellitus (H[facility])   Hypertension   Constipation   Mouth pain    Discharge Instructions  Discharge Instructions    Diet - low sodium heart healthy   Complete by: As directed    Discharge instructions   Complete by: As directed    1)Please take prescribed medications as instructed 2)Follow up with oncology and radiation oncology as an outpatient. 3)Do a BMP test every other day to check for sodium and potassium levels   Increase activity slowly   Complete by: As directed    No wound care   Complete by: As directed      Allergies as of 03/03/2020   No Known Allergies     Medication List    STOP taking these medications   HYDROcodone-acetaminophen 5-325 MG tablet Commonly known as: NORCO/VICODIN   lisinopril-hydrochlorothiazide 20-25 MG tablet Commonly known as: ZESTORETIC     TAKE these medications   amLODipine 10 MG tablet Commonly known as: NORVASC Take 1 tablet (10 mg total) by mouth daily. Start taking on: March 04, 2020   carvedilol 3.125 MG tablet Commonly known as: COREG Take 1 tablet (3.125 mg total) by mouth 2 (two) times daily  with a meal.   dexamethasone 4 MG tablet Commonly known as: Decadron Take 1 tablet (4 mg total) by mouth 3 (three) times daily.   feeding supplement (ENSURE ENLIVE) Liqd Take 237 mLs by mouth 3 (three) times daily between meals.   insulin aspart 100 UNIT/ML injection Commonly known as: novoLOG Inject 0-9 Units into the skin 3 (three) times daily with meals.   insulin aspart 100 UNIT/ML injection Commonly known as: novoLOG Inject 3 Units into the skin 3 (three) times daily with meals.   insulin glargine 100 UNIT/ML injection Commonly known as: LANTUS Inject 0.1 mLs (10 Units total) into the skin at bedtime.   lidocaine 2 % solution Commonly known as: XYLOCAINE Use as directed 15 mLs in the mouth or throat every 6 (six) hours as needed for mouth pain.   lidocaine 5 % Commonly known as: LIDODERM Place 1 patch onto the skin daily. Remove & Discard patch within 12 hours or as directed by MD   oxyCODONE 5 MG immediate release tablet Commonly known as: Oxy IR/ROXICODONE Take 1 tablet (5 mg total) by mouth every 4 (four) hours as needed for moderate pain.   pantoprazole 40 MG tablet Commonly known as: PROTONIX Take 1 tablet (40 mg total) by mouth daily. Start taking on: March 04, 2020   polyethylene glycol 17 g packet Commonly known as: MIRALAX / GLYCOLAX Take 17 g by mouth daily as needed for moderate constipation.  simvastatin 20 MG tablet Commonly known as: ZOCOR Take 20 mg by mouth daily.       No Known Allergies  Consultations:  Oncology, radiation oncology   Procedures/Studies: CT ABDOMEN PELVIS WO CONTRAST  Result Date: 02/14/2020 CLINICAL DATA:  Abdominal trauma. Trauma 1 month ago. Hypercalcemia. Fell 1 month ago. Abdominal pain, LOWER back pain, vomiting, diarrhea since the fall. EXAM: CT CHEST, ABDOMEN AND PELVIS WITHOUT CONTRAST TECHNIQUE: Multidetector CT imaging of the chest, abdomen and pelvis was performed following the standard protocol without IV  contrast. COMPARISON:  None. FINDINGS: CT CHEST FINDINGS Cardiovascular: Heart is normal in size. Minimal atherosclerotic calcification of the coronary vessels. There is focal calcification of the ascending thoracic aorta, not associated with aneurysm. Normal noncontrast appearance of the pulmonary arteries. Mediastinum/Nodes: The visualized portion of the thyroid gland has a normal appearance. No significant mediastinal, hilar, or axillary adenopathy. Lungs/Pleura: There is subsegmental atelectasis at the lung bases. No suspicious pulmonary nodules. There is focal pleural thickening adjacent to LEFT anterior second rib lesion. Musculoskeletal: Diffuse lytic lesions are identified throughout the skeleton. Lesions vary in size from 2 millimeters to 1.8 centimeters. There is near complete replacement of the T5 vertebral body and loss of vertebral body height by approximately 30%. A soft tissue component extends to the RIGHT of the vertebral body and extends into the canal, impinging on the spinal cord, image 25 of series 3. Soft tissue mass in this region is 4.2 x 3.2 centimeters. At T9, there is near complete replacement of the vertebral body with soft tissue component to the RIGHT of the vertebral body. Loss of vertebral body height is approximately 30%. Suspect impingement of the cord at this level. Image 38 of series 3. Large lytic lesion is identified within the T5 vertebral body, measuring 1.8 centimeters. This lesion extends to the RIGHT cortex 2. Acute fracture of LEFT ribs 9 and 10, possibly pathologic. There is a soft tissue component associated with lesions in the LEFT second rib. Soft tissue component measures 1.1 x 1.8 centimeters. CT ABDOMEN PELVIS FINDINGS Hepatobiliary: No focal liver abnormality is seen. No gallstones, gallbladder wall thickening, or biliary dilatation. Pancreas: Unremarkable. No pancreatic ductal dilatation or surrounding inflammatory changes. Spleen: Normal in size without focal  abnormality. Adrenals/Urinary Tract: Adrenal glands are unremarkable. Kidneys are normal, without renal calculi, focal lesion, or hydronephrosis. Bladder is unremarkable. Stomach/Bowel: Stomach and small bowel loops are unremarkable. The appendix is well seen and has a normal appearance. Loops of colon are unremarkable. Average stool burden. Vascular/Lymphatic: No significant vascular findings are present. No enlarged abdominal or pelvic lymph nodes. Reproductive: Prostate is mildly enlarged. There are prostatic calcifications. Other: Fat containing RIGHT inguinal hernia.  No ascites. Musculoskeletal: Diffuse lytic lesions throughout the pelvis, proximal femurs, and lumbar spine. IMPRESSION: 1. Diffuse lytic lesions throughout the skeleton, consistent with multiple myeloma or metastatic disease. Multiple myeloma is favored. 2. Pathologic fractures at T5 and T9. These levels show significant soft tissue mass, and impingement upon the spinal cord. Recommend further evaluation with MRI of the thoracic spine with and without contrast. 3. Acute fractures of LEFT ribs 9 and 10, possibly pathologic. 4. No evidence for acute injury of the abdomen or pelvis. 5. Fat containing RIGHT inguinal hernia. 6. Mildly enlarged prostate. 7. Aortic Atherosclerosis (ICD10-I70.0). These results were called by telephone at the time of interpretation on 02/14/2020 at 4:06 pm to provider [facility] , who verbally acknowledged these results. Electronically Signed   By: Nolon Nations M.D.  On: 02/14/2020 16:07   CT Chest Wo Contrast  Result Date: 02/14/2020 CLINICAL DATA:  Abdominal trauma. Trauma 1 month ago. Hypercalcemia. Fell 1 month ago. Abdominal pain, LOWER back pain, vomiting, diarrhea since the fall. EXAM: CT CHEST, ABDOMEN AND PELVIS WITHOUT CONTRAST TECHNIQUE: Multidetector CT imaging of the chest, abdomen and pelvis was performed following the standard protocol without IV contrast. COMPARISON:  None. FINDINGS: CT CHEST  FINDINGS Cardiovascular: Heart is normal in size. Minimal atherosclerotic calcification of the coronary vessels. There is focal calcification of the ascending thoracic aorta, not associated with aneurysm. Normal noncontrast appearance of the pulmonary arteries. Mediastinum/Nodes: The visualized portion of the thyroid gland has a normal appearance. No significant mediastinal, hilar, or axillary adenopathy. Lungs/Pleura: There is subsegmental atelectasis at the lung bases. No suspicious pulmonary nodules. There is focal pleural thickening adjacent to LEFT anterior second rib lesion. Musculoskeletal: Diffuse lytic lesions are identified throughout the skeleton. Lesions vary in size from 2 millimeters to 1.8 centimeters. There is near complete replacement of the T5 vertebral body and loss of vertebral body height by approximately 30%. A soft tissue component extends to the RIGHT of the vertebral body and extends into the canal, impinging on the spinal cord, image 25 of series 3. Soft tissue mass in this region is 4.2 x 3.2 centimeters. At T9, there is near complete replacement of the vertebral body with soft tissue component to the RIGHT of the vertebral body. Loss of vertebral body height is approximately 30%. Suspect impingement of the cord at this level. Image 38 of series 3. Large lytic lesion is identified within the T5 vertebral body, measuring 1.8 centimeters. This lesion extends to the RIGHT cortex 2. Acute fracture of LEFT ribs 9 and 10, possibly pathologic. There is a soft tissue component associated with lesions in the LEFT second rib. Soft tissue component measures 1.1 x 1.8 centimeters. CT ABDOMEN PELVIS FINDINGS Hepatobiliary: No focal liver abnormality is seen. No gallstones, gallbladder wall thickening, or biliary dilatation. Pancreas: Unremarkable. No pancreatic ductal dilatation or surrounding inflammatory changes. Spleen: Normal in size without focal abnormality. Adrenals/Urinary Tract: Adrenal glands  are unremarkable. Kidneys are normal, without renal calculi, focal lesion, or hydronephrosis. Bladder is unremarkable. Stomach/Bowel: Stomach and small bowel loops are unremarkable. The appendix is well seen and has a normal appearance. Loops of colon are unremarkable. Average stool burden. Vascular/Lymphatic: No significant vascular findings are present. No enlarged abdominal or pelvic lymph nodes. Reproductive: Prostate is mildly enlarged. There are prostatic calcifications. Other: Fat containing RIGHT inguinal hernia.  No ascites. Musculoskeletal: Diffuse lytic lesions throughout the pelvis, proximal femurs, and lumbar spine. IMPRESSION: 1. Diffuse lytic lesions throughout the skeleton, consistent with multiple myeloma or metastatic disease. Multiple myeloma is favored. 2. Pathologic fractures at T5 and T9. These levels show significant soft tissue mass, and impingement upon the spinal cord. Recommend further evaluation with MRI of the thoracic spine with and without contrast. 3. Acute fractures of LEFT ribs 9 and 10, possibly pathologic. 4. No evidence for acute injury of the abdomen or pelvis. 5. Fat containing RIGHT inguinal hernia. 6. Mildly enlarged prostate. 7. Aortic Atherosclerosis (ICD10-I70.0). These results were called by telephone at the time of interpretation on 02/14/2020 at 4:06 pm to provider [facility] , who verbally acknowledged these results. Electronically Signed   By: Norva Pavlov M.D.   On: 02/14/2020 16:07   MR THORACIC SPINE W WO CONTRAST  Result Date: 02/14/2020 CLINICAL DATA:  Back pain. Compression fracture. Fever with nausea and vomiting. EXAM:  MRI THORACIC WITHOUT AND WITH CONTRAST TECHNIQUE: Multiplanar and multiecho pulse sequences of the thoracic spine were obtained without and with intravenous contrast. CONTRAST:  49mL GADAVIST GADOBUTROL 1 MMOL/ML IV SOLN COMPARISON:  None. FINDINGS: MRI THORACIC SPINE FINDINGS Alignment:  Normal Vertebrae: There are numerous bone marrow  lesions throughout all visualized levels, but worst at T5, T6 and T9. There is compression deformity of T6 with 50% height loss and 5 mm of retropulsion. There is compression deformity of T9 with approximately 10% height loss and 3 mm of retropulsion. Cord: There is mild hyperintense T2-weighted signal in the spinal cord at T5. Paraspinal and other soft tissues: There is soft tissue mass is associated with the vertebral lesions at T6 and T9. Otherwise, the paraspinal soft tissues are unremarkable. Disc levels: T5-6: Contrast-enhancing mass replaces most of the vertebral body bone marrow and extends into the pedicles, transverse processes and the right paraspinal tissues. There is also epidural extension that severely narrows the thecal sac and causes mass effect on the spinal cord. The mass also severely narrows the right T5 neural foramen. T8-9: Soft tissue mass replacing much of the bone marrow at T9 with epidural extension mildly narrowing the thecal sac with slight mass effect on the spinal cord. The mass extends into the right neural foramen. IMPRESSION: 1. Numerous bone marrow lesions throughout all visualized levels, but worst at T5, T6 and T9. 2. 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 The T6 lesion severely narrows the thecal sac and deforms the spinal cord. 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 3. Pathologic fractures of T6 and T9 with 3-5 mm of retropulsion. Electronically Signed   By: Deatra Robinson M.D.   On: 02/14/2020 19:14   IR Fluoro Guide CV Line Right  Result Date: 02/17/2020 CLINICAL DATA:  Renal failure and need for tunneled hemodialysis catheter to begin hemodialysis. EXAM: TUNNELED CENTRAL VENOUS HEMODIALYSIS CATHETER PLACEMENT WITH ULTRASOUND AND FLUOROSCOPIC GUIDANCE ANESTHESIA/SEDATION: 1.0 mg IV Versed; 50 mcg IV Fentanyl. Total Moderate Sedation Time:   23 minutes. The patient's level of consciousness and  physiologic status were continuously monitored during the procedure by Radiology nursing. MEDICATIONS: 2 g IV Ancef. FLUOROSCOPY TIME:  48 seconds. 4.0 mGy. PROCEDURE: The procedure, risks, benefits, and alternatives were explained to the patient. Questions regarding the procedure were encouraged and answered. The patient understands and consents to the procedure. A timeout was performed prior to initiating the procedure. The right neck and chest were prepped with chlorhexidine in a sterile fashion, and a sterile drape was applied covering the operative field. Maximum barrier sterile technique with sterile gowns and gloves were used for the procedure. Local anesthesia was provided with 1% lidocaine. Ultrasound was used to confirm patency of the right internal jugular vein. After creating a small venotomy incision, a 21 gauge needle was advanced into the right internal jugular vein under direct, real-time ultrasound guidance. Ultrasound image documentation was performed. After securing guidewire access, an 8 Fr dilator was placed. A J-wire was kinked to measure appropriate catheter length. A Palindrome tunneled hemodialysis catheter measuring 19 cm from tip to cuff was chosen for placement. This was tunneled in a retrograde fashion from the chest wall to the venotomy incision. At the venotomy, serial dilatation was performed and a 15 Fr peel-away sheath was placed over a guidewire. The catheter was then placed through the sheath and the sheath removed. Final catheter positioning was confirmed and documented with a fluoroscopic spot  image. The catheter was aspirated, flushed with saline, and injected with appropriate volume heparin dwells. The venotomy incision was closed with subcuticular 4-0 Vicryl. Dermabond was applied to the incision. The catheter exit site was secured with 0-Prolene retention sutures. COMPLICATIONS: None.  No pneumothorax. FINDINGS: After catheter placement, the tip lies in the right atrium. The  catheter aspirates normally and is ready for immediate use. IMPRESSION: Placement of tunneled hemodialysis catheter via the right internal jugular vein. The catheter tip lies in the right atrium. The catheter is ready for immediate use. Electronically Signed   By: Aletta Edouard M.D.   On: 02/17/2020 17:22   IR Removal Tun Cv Cath W/O FL  Result Date: 03/01/2020 INDICATION: Patient with history of multiple myeloma with acute kidney injury and placement of tunneled right internal jugular HD catheter on 02/17/2020; now with improving creatinine and urine output. Request received for HD catheter removal. EXAM: REMOVAL TUNNELED CENTRAL VENOUS CATHETER MEDICATIONS: None ANESTHESIA/SEDATION: None FLUOROSCOPY TIME:  None COMPLICATIONS: None immediate. PROCEDURE: Informed written consent was obtained from the patient after a thorough discussion of the procedural risks, benefits and alternatives. All questions were addressed. Maximal Sterile Barrier Technique was utilized including caps, mask, sterile gowns, sterile gloves, sterile drape, hand hygiene and skin antiseptic. A timeout was performed prior to the initiation of the procedure. The patient's right chest and catheter was prepped and draped in a normal sterile fashion. Heparin was removed from both ports of catheter. Using gentle manual traction the cuff of the catheter was exposed and the catheter was removed in it's entirety. Pressure was held till hemostasis was obtained. A sterile dressing was applied. The patient tolerated the procedure well with no immediate complications. IMPRESSION: Successful catheter removal as described above. Read by: Rowe Robert, PA-C Electronically Signed   By: Jacqulynn Cadet M.D.   On: 03/01/2020 13:40   IR US Guide Vasc Access Right  Result Date: 02/17/2020 CLINICAL DATA:  Renal failure and need for tunneled hemodialysis catheter to begin hemodialysis. EXAM: TUNNELED CENTRAL VENOUS HEMODIALYSIS CATHETER PLACEMENT WITH  ULTRASOUND AND FLUOROSCOPIC GUIDANCE ANESTHESIA/SEDATION: 1.0 mg IV Versed; 50 mcg IV Fentanyl. Total Moderate Sedation Time:   23 minutes. The patient's level of consciousness and physiologic status were continuously monitored during the procedure by Radiology nursing. MEDICATIONS: 2 g IV Ancef. FLUOROSCOPY TIME:  48 seconds. 4.0 mGy. PROCEDURE: The procedure, risks, benefits, and alternatives were explained to the patient. Questions regarding the procedure were encouraged and answered. The patient understands and consents to the procedure. A timeout was performed prior to initiating the procedure. The right neck and chest were prepped with chlorhexidine in a sterile fashion, and a sterile drape was applied covering the operative field. Maximum barrier sterile technique with sterile gowns and gloves were used for the procedure. Local anesthesia was provided with 1% lidocaine. Ultrasound was used to confirm patency of the right internal jugular vein. After creating a small venotomy incision, a 21 gauge needle was advanced into the right internal jugular vein under direct, real-time ultrasound guidance. Ultrasound image documentation was performed. After securing guidewire access, an 8 Fr dilator was placed. A J-wire was kinked to measure appropriate catheter length. A Palindrome tunneled hemodialysis catheter measuring 19 cm from tip to cuff was chosen for placement. This was tunneled in a retrograde fashion from the chest wall to the venotomy incision. At the venotomy, serial dilatation was performed and a 15 Fr peel-away sheath was placed over a guidewire. The catheter was then placed through the sheath  and the sheath removed. Final catheter positioning was confirmed and documented with a fluoroscopic spot image. The catheter was aspirated, flushed with saline, and injected with appropriate volume heparin dwells. The venotomy incision was closed with subcuticular 4-0 Vicryl. Dermabond was applied to the incision.  The catheter exit site was secured with 0-Prolene retention sutures. COMPLICATIONS: None.  No pneumothorax. FINDINGS: After catheter placement, the tip lies in the right atrium. The catheter aspirates normally and is ready for immediate use. IMPRESSION: Placement of tunneled hemodialysis catheter via the right internal jugular vein. The catheter tip lies in the right atrium. The catheter is ready for immediate use. Electronically Signed   By: Aletta Edouard M.D.   On: 02/17/2020 17:22   DG CHEST PORT 1 VIEW  Result Date: 02/25/2020 CLINICAL DATA:  Left-sided rib pain, back pain EXAM: PORTABLE CHEST 1 VIEW COMPARISON:  01/26/2020 FINDINGS: Single frontal view of the chest demonstrates a stable cardiac silhouette. Left lower lobe consolidation is seen within the retrocardiac region. No effusion or pneumothorax. Right internal jugular dialysis catheter tip overlies superior vena cava. Small lytic lesions are seen throughout the thoracic cage and left scapula. The lytic thoracic spine lesion seen on recent CT are more difficult to appreciate on portable x-ray. IMPRESSION: 1. Dense left lower lobe consolidation, favor atelectasis. 2. Diffuse lytic lesions throughout the visualized bony structures, consistent with multiple myeloma. Electronically Signed   By: Randa Ngo M.D.   On: 02/25/2020 17:52   CT BONE MARROW BIOPSY & ASPIRATION  Result Date: 02/19/2020 CLINICAL DATA:  Clinical suspicion multiple myeloma and need for bone marrow biopsy. EXAM: CT GUIDED BONE MARROW ASPIRATION AND BIOPSY ANESTHESIA/SEDATION: Versed 1.0 mg IV, Fentanyl 50 mcg IV Total Moderate Sedation Time:   14 minutes. The patient's level of consciousness and physiologic status were continuously monitored during the procedure by Radiology nursing. PROCEDURE: The procedure risks, benefits, and alternatives were explained to the patient. Questions regarding the procedure were encouraged and answered. The patient understands and consents to  the procedure. A time out was performed prior to initiating the procedure. The right gluteal region was prepped with chlorhexidine. Sterile gown and sterile gloves were used for the procedure. Local anesthesia was provided with 1% Lidocaine. Under CT guidance, an 11 gauge On Control bone cutting needle was advanced from a posterior approach into the right iliac bone. Needle positioning was confirmed with CT. Initial non heparinized and heparinized aspirate samples were obtained of bone marrow. Core biopsy was performed via the On Control drill needle. COMPLICATIONS: None FINDINGS: Inspection of initial aspirate did reveal visible particles. Intact core biopsy sample was obtained. IMPRESSION: CT guided bone marrow biopsy of right posterior iliac bone with both aspirate and core samples obtained. Electronically Signed   By: Aletta Edouard M.D.   On: 02/19/2020 12:21       Subjective: Patient seen and examined the bedside this morning.  Hemodynamically stable for discharge today.  Discharge Exam: Vitals:   03/02/20 2137 03/03/20 0653  BP: (!) 141/83 130/85  Pulse: 84 83  Resp: 17 17  Temp: 97.8 F (36.6 C) 97.9 F (36.6 C)  SpO2: 96% 94%   Vitals:   03/02/20 0500 03/02/20 1743 03/02/20 2137 03/03/20 0653  BP:  133/83 (!) 141/83 130/85  Pulse:  85 84 83  Resp:  _0 Temp:  97.7 F (36.5 C) 97.8 F (36.6 C) 97.9 F (36.6 C)  TempSrc:  Oral Oral Oral  SpO2:  96% 96% 94%  Weight:  47.1 kg   48 kg  Height:        General: Pt is alert, awake, not in acute distress Cardiovascular: RRR, S1/S2 +, no rubs, no gallops Respiratory: CTA bilaterally, no wheezing, no rhonchi Abdominal: Soft, NT, ND, bowel sounds + Extremities: no edema, no cyanosis    The results of significant diagnostics from this hospitalization (including imaging, microbiology, ancillary and laboratory) are listed below for reference.     Microbiology: No results found for this or any previous visit (from the  past 240 hour(s)).   Labs: BNP (last 3 results) No results for input(s): BNP in the last 8760 hours. Basic Metabolic Panel: Recent Labs  Lab 02/27/20 0639 02/27/20 6754 02/28/20 0518 02/29/20 0927 03/01/20 0612 03/02/20 0541 03/03/20 0612  NA 130*   < > 131* 129* 127* 124* 123*  K 5.4*   < > 5.1 5.4* 5.4* 5.3* 4.6  CL 101   < > 100 99 95* 92* 90*  CO2 20*   < > 21* 21* _0 GLUCOSE 171*   < > 185* 174* 192* 241* 200*  BUN 61*   < > 68* 66* 66* 65* 62*  CREATININE 1.83*   < > 1.92* 1.63* 1.73* 1.56* 1.49*  CALCIUM 10.0   < > 10.2 10.8* 11.4* 11.5* 11.7*  MG 1.8  --  1.9 1.9 1.8 1.8  --    < > = values in this interval not displayed.   Liver Function Tests: Recent Labs  Lab 02/27/20 0639  AST 30  ALT 47*  ALKPHOS 130*  BILITOT 0.5  PROT 6.8  ALBUMIN 3.0*   No results for input(s): LIPASE, AMYLASE in the last 168 hours. No results for input(s): AMMONIA in the last 168 hours. CBC: Recent Labs  Lab 02/27/20 0639 02/28/20 0518 02/29/20 0927 03/01/20 0612 03/02/20 0541  WBC 10.6* 7.3 9.5 10.7* 10.3  NEUTROABS 9.0* 6.3 8.4* 9.2* 8.9*  HGB 11.0* 10.2* 11.2* 11.4* 11.4*  HCT 31.9* 29.8* 32.5* 33.0* 32.7*  MCV 86.4 86.4 86.0 86.4 85.4  PLT 207 170 156 168 148*   Cardiac Enzymes: No results for input(s): CKTOTAL, CKMB, CKMBINDEX, TROPONINI in the last 168 hours. BNP: Invalid input(s): POCBNP CBG: Recent Labs  Lab 03/02/20 0744 03/02/20 1208 03/02/20 1741 03/02/20 2139 03/03/20 0738  GLUCAP 283* 308* 220* 274* 188*   D-Dimer No results for input(s): DDIMER in the last 72 hours. Hgb A1c No results for input(s): HGBA1C in the last 72 hours. Lipid Profile No results for input(s): CHOL, HDL, LDLCALC, TRIG, CHOLHDL, LDLDIRECT in the last 72 hours. Thyroid function studies No results for input(s): TSH, T4TOTAL, T3FREE, THYROIDAB in the last 72 hours.  Invalid input(s): FREET3 Anemia work up No results for input(s): VITAMINB12, FOLATE, FERRITIN, TIBC,  IRON, RETICCTPCT in the last 72 hours. Urinalysis    Component Value Date/Time   COLORURINE COLORLESS (A) 02/25/2020 1005   APPEARANCEUR CLEAR 02/25/2020 1005   LABSPEC 1.008 02/25/2020 1005   PHURINE 8.0 02/25/2020 1005   GLUCOSEU NEGATIVE 02/25/2020 1005   HGBUR NEGATIVE 02/25/2020 1005   BILIRUBINUR NEGATIVE 02/25/2020 1005   KETONESUR NEGATIVE 02/25/2020 1005   PROTEINUR NEGATIVE 02/25/2020 1005   NITRITE NEGATIVE 02/25/2020 1005   LEUKOCYTESUR NEGATIVE 02/25/2020 1005   Sepsis Labs Invalid input(s): PROCALCITONIN,  WBC,  LACTICIDVEN Microbiology No results found for this or any previous visit (from the past 240 hour(s)).  Please note: You were cared for by a hospitalist during your hospital stay. Once you  are discharged, your primary care physician will handle any further medical issues. Please note that NO REFILLS for any discharge medications will be authorized once you are discharged, as it is imperative that you return to your primary care physician (or establish a relationship with a primary care physician if you do not have one) for your post hospital discharge needs so that they can reassess your need for medications and monitor your lab values.    Time coordinating discharge: 40 minutes  SIGNED:   Shelly Coss, MD  [facility] 03/03/2020, 11:34 AM Pager [phone]  If 7PM-7AM, please contact night-coverage [email or web address] Password [email or web address]

## 2020-03-03 NOTE — Progress Notes (Addendum)
Inpatient Rehabilitation Admissions Coordinator  CIR bed at [facility] has a bed available today for admit. I have alerted Dr. Tawanna Solo, acute team and TOC. I await verbal confirmation from patient once RN available to translate at pt's bedside to make the arrangements. He will be admitted to Room [address] and DrDagoberto Ligas will be admitting Rehab MD.   Danne Baxter, RN, MSN Rehab Admissions Coordinator [phone] 03/03/2020 11:21 AM   Patient has confirmed agreement to admit to CIR today. I have contacted [facility] for transport.

## 2020-03-03 NOTE — Progress Notes (Signed)
Lower ext. study completed.   See CVProc for preliminary results.   Griffin Basil, RDMS, RVT

## 2020-03-03 NOTE — Progress Notes (Signed)
Patient left unit in stable conditions with [facility]. Patient understands he will be going to [facility] for rehabilitation. Report Given to Charge nurse Abigail Miyamoto.

## 2020-03-03 NOTE — Progress Notes (Signed)
Pt arrived to unit via EMS. Pt is alert and oriented, abel to make needs known, pt speaks spanish and requires interpreter. All needs met, call bell in reach, pt to xray at this time.

## 2020-03-03 NOTE — H&P (Signed)
Physical Medicine and Rehabilitation Admission H&P    Chief Complaint  Patient presents with  . Functional deficits due to   . Multiple myeloma with thoracic spine Fx, ARF and hyponatremia     HPI:  Trevor Shelton is a 54 year old male in relatively good health except for a fall 4 feed off a ladder  01/26/20 with onset of back, left rib and progressive BLE weakness. He was evaluated by ortho on outpatient basis with treatment but continued to worsen with reports of burning pain in the back as well as decrease in UOP and constipation. He was admitted via ED for work up on 02/14/20 and found to have diffuse lytic lesions throughout the skeleton, epidural tumor extension with pathologic Fx of T6- (50% loss of ht)and T9 (10% loss of ht with  32m retropulsion), contrast enhancing mass replacing most of T5 vertebral body with severe cord compression, acute fracture of left 9 and 10 ribs. Work up concerning for MM as he was also found to have hypercalcemia with ARF and significant hyponatremia. Dr LAugustin Coupefelt recommended IVF for aggressive hydration as well as calcitonin,  Zometa and pamidronate for management of hypercalcemia. hyponatremia felt to be due to paraproteinemia.   Dr. TNoni Sauperecommended XRT for high grade epidural disease as well as IV decadron for cord compression.    He required RRT briefly due to signs of uremia and as renal status improving with rise in Na to 133-->nephrology signed off.  24 hours urine showed M protein and modestly elevated Kappa ratio and bone marrow biopsy showed 20-30% plasma cells. Patient with newly diagnosed IgG Kappa MM and Dr. DLorenso Courierplans on CyBorD chemo to start prior to discharge?. He completes 10/10 doses of XRT and to consider oral po lxazomib/cytoxan/dexamethasone (to reduce burden of care) immediately following XRT.   BUN/SCr steadily improving but sodium continues to decline down to 123 today. Lokelmia added due to onset of hyperkalemia.  Blood sugars poorly  controlled and novolog added for better control.  He has had issues with decline in po intake in the past 3 days (does not like our food), pain extending from back around his ribs, as well as BLE weakness. Has been going to BR after meals due to gas--continent and having BM daily per wife/patient. He feels that his voiding--dysuria has improved. He was working and independent till 2 weeks PTA.   Therapy has been ongoing and working on pregait activity. CIR recommended due to functional decline.   Pt reports can't describe pain, but very painful- but pain meds work to calm pain down- having BM 1x/day- on toilet or BSC usually- voiding well.  LBM yesterday- was asking if would continue to have 1 BM/day- which I said hopefully.   Review of Systems  Constitutional: Negative for chills and fever.  HENT: Negative for hearing loss and tinnitus.   Eyes: Negative for blurred vision and double vision.  Respiratory: Negative for cough, shortness of breath (better) and stridor.   Cardiovascular: Positive for chest pain (chest wall pain Left> right). Negative for leg swelling.  Gastrointestinal: Positive for nausea. Negative for abdominal pain and constipation (has been having BM daily per wife).  Genitourinary: Negative for dysuria and urgency.  Musculoskeletal: Positive for back pain and myalgias.  Skin: Negative for itching and rash.  Neurological: Positive for dizziness (has been feeling dizzy when up), tingling, sensory change (feet are numb.  Left thigh burns) and weakness (LLE>RLE weakness ).  Psychiatric/Behavioral: The patient has  insomnia (gets restless after his wife leaves. Has been taking a pill).   All other systems reviewed and are negative.    Past Medical History:  Diagnosis Date  . AKI (acute kidney injury) ([place]) 02/2020  . Hypercalcemia 02/2020  . Hypercholesteremia   . Hypertension     Past Surgical History:  Procedure Laterality Date  . HERNIA REPAIR    . IR FLUORO GUIDE CV  LINE RIGHT  02/17/2020  . IR REMOVAL TUN CV CATH W/O FL  03/01/2020  . IR US GUIDE VASC ACCESS RIGHT  02/17/2020    Family History  Problem Relation Age of Onset  . Alzheimer's disease Mother   . Diabetes Father   . Diabetes Brother      Social History:  Married. Works as a Curator. Has 100 year old daughter at home. Wife works occasionally. He used to smoke--quit 20+ years ago. He has never used smokeless tobacco. He reports previous alcohol use. He reports previous drug use.    Allergies: No Known Allergies    Medications Prior to Admission  Medication Sig Dispense Refill  . [START ON 03/04/2020] amLODipine (NORVASC) 10 MG tablet Take 1 tablet (10 mg total) by mouth daily.    . carvedilol (COREG) 3.125 MG tablet Take 1 tablet (3.125 mg total) by mouth 2 (two) times daily with a meal.    . dexamethasone (DECADRON) 4 MG tablet Take 1 tablet (4 mg total) by mouth 3 (three) times daily. 120 tablet 0  . feeding supplement, ENSURE ENLIVE, (ENSURE ENLIVE) LIQD Take 237 mLs by mouth 3 (three) times daily between meals. 237 mL 12  . insulin aspart (NOVOLOG) 100 UNIT/ML injection Inject 0-9 Units into the skin 3 (three) times daily with meals. 10 mL 11  . insulin aspart (NOVOLOG) 100 UNIT/ML injection Inject 3 Units into the skin 3 (three) times daily with meals. 10 mL 11  . insulin glargine (LANTUS) 100 UNIT/ML injection Inject 0.1 mLs (10 Units total) into the skin at bedtime. 10 mL 11  . lidocaine (LIDODERM) 5 % Place 1 patch onto the skin daily. Remove & Discard patch within 12 hours or as directed by MD 30 patch 0  . lidocaine (XYLOCAINE) 2 % solution Use as directed 15 mLs in the mouth or throat every 6 (six) hours as needed for mouth pain.  0  . oxyCODONE (OXY IR/ROXICODONE) 5 MG immediate release tablet Take 1 tablet (5 mg total) by mouth every 4 (four) hours as needed for moderate pain. 30 tablet 0  . [START ON 03/04/2020] pantoprazole (PROTONIX) 40 MG tablet Take 1 tablet (40 mg total) by  mouth daily.    . polyethylene glycol (MIRALAX / GLYCOLAX) 17 g packet Take 17 g by mouth daily as needed for moderate constipation. 14 each 0  . simvastatin (ZOCOR) 20 MG tablet Take 20 mg by mouth daily.      Drug Regimen Review  Drug regimen was reviewed and remains appropriate with no significant issues identified  Home: Home Living Family/patient expects to be discharged to:: Private residence Living Arrangements: Spouse/significant other, Children   Functional History:    Functional Status:  Mobility:          ADL:    Cognition: Cognition Orientation Level: Oriented X4     Blood pressure 131/84, pulse 80, resp. rate 16, height '5\' 3"'  (1.6 m), SpO2 99 %. Physical Exam Vitals and nursing note reviewed. Exam conducted with a chaperone present.  Constitutional:  Appearance: He is well-developed.     Comments: Sitting up in bed- RNs at bedside, using tele- interpretor , appropriate, NAD  HENT:     Head: Normocephalic and atraumatic.     Comments: No facial asymmetry    Right Ear: External ear normal.     Left Ear: External ear normal.     Nose: Nose normal. No congestion.     Mouth/Throat:     Mouth: Mucous membranes are moist.     Pharynx: Oropharynx is clear. No oropharyngeal exudate.  Eyes:     General:        Right eye: No discharge.        Left eye: No discharge.     Extraocular Movements: Extraocular movements intact.  Cardiovascular:     Rate and Rhythm: Normal rate and regular rhythm.     Heart sounds: Normal heart sounds.  Pulmonary:     Comments: CTA B/L- no W/R/R- good air movement- TTP over chest due to rib fractures Abdominal:     Comments: Soft, NT, ND, (+)BS hypoactive  Musculoskeletal:     Cervical back: Neck supple. No rigidity.     Comments: UEs 5/5 in deltoid, biceps, triceps, WE, grip and finger abd B/L- pain with biceps testing due to rib fx's LEs- RLE- HF 3-/5, KE 3+/5, DF 4-/5, PF 5-/5 LLE- HF 2.5, KE 3/5, DF 2/5, PF 5-/5    Skin:    General: Skin is warm and dry.     Comments: IV in R wrist- looks good Large purple bruise on sacrum- a tiny puncture mark under it- healing well B/L knee brown squishy bruises Trace L foot edema- not ankle 2 radiation tags- chest and R side  Neurological:     Mental Status: He is alert and oriented to person, place, and time.     Comments: Speech clear and able to follow commands without difficulty.   Light touch intact in all 4 extremities B/L   Psychiatric:        Behavior: Behavior normal.     Comments: Slightly anxious    Results for orders placed or performed during the hospital encounter of 03/03/20 (from the past 48 hour(s))  Glucose, capillary     Status: Abnormal   Collection Time: 03/03/20 12:39 PM  Result Value Ref Range   Glucose-Capillary 323 (H) 70 - 99 mg/dL    Comment: Glucose reference range applies only to samples taken after fasting for at least 8 hours.  Glucose, capillary     Status: Abnormal   Collection Time: 03/03/20  5:49 PM  Result Value Ref Range   Glucose-Capillary 254 (H) 70 - 99 mg/dL    Comment: Glucose reference range applies only to samples taken after fasting for at least 8 hours.   DG Abd 1 View  Result Date: 03/03/2020 CLINICAL DATA:  Cord compression.  Left upper quadrant pain. EXAM: ABDOMEN - 1 VIEW COMPARISON:  CT 02/14/2020. FINDINGS: Distended loops of small and large bowel noted suggesting adynamic ileus. Recent CT of the abdomen pelvis of 02/14/2020 did reveal a right inguinal hernia with herniation of fat. No free air. Bony lesions present best identified by prior CT. IMPRESSION: Distended loops of small and large bowel noted suggesting adynamic ileus. Recent CT of the abdomen pelvis of 02/14/2020 however did reveal a right inguinal hernia with herniation of fat. Follow-up exams suggested to demonstrate resolution of bowel distention in order to exclude bowel obstruction. Electronically Signed   By: Marcello Moores  Register  On:  03/03/2020 13:56   VAS [place] LOWER EXTREMITY VENOUS (DVT)  Result Date: 03/03/2020  Lower Venous DVTStudy Indications: Immobility.  Performing Technologist: Griffin Basil RCT RDMS  Examination Guidelines: A complete evaluation includes B-mode imaging, spectral Doppler, color Doppler, and power Doppler as needed of all accessible portions of each vessel. Bilateral testing is considered an integral part of a complete examination. Limited examinations for reoccurring indications may be performed as noted. The reflux portion of the exam is performed with the patient in reverse Trendelenburg.  +---------+---------------+---------+-----------+----------+--------------+ RIGHT    CompressibilityPhasicitySpontaneityPropertiesThrombus Aging +---------+---------------+---------+-----------+----------+--------------+ CFV      Full           Yes      Yes                                 +---------+---------------+---------+-----------+----------+--------------+ SFJ      Full                                                        +---------+---------------+---------+-----------+----------+--------------+ FV Prox  Full                                                        +---------+---------------+---------+-----------+----------+--------------+ FV Mid   Full                                                        +---------+---------------+---------+-----------+----------+--------------+ FV DistalFull                                                        +---------+---------------+---------+-----------+----------+--------------+ PFV      Full                                                        +---------+---------------+---------+-----------+----------+--------------+ POP      Full           Yes      Yes                                 +---------+---------------+---------+-----------+----------+--------------+ PTV      Full                                                         +---------+---------------+---------+-----------+----------+--------------+ PERO     Full                                                        +---------+---------------+---------+-----------+----------+--------------+   +---------+---------------+---------+-----------+----------+--------------+  LEFT     CompressibilityPhasicitySpontaneityPropertiesThrombus Aging +---------+---------------+---------+-----------+----------+--------------+ CFV      Full           Yes      Yes                                 +---------+---------------+---------+-----------+----------+--------------+ SFJ      Full                                                        +---------+---------------+---------+-----------+----------+--------------+ FV Prox  Full                                                        +---------+---------------+---------+-----------+----------+--------------+ FV Mid   Full                                                        +---------+---------------+---------+-----------+----------+--------------+ FV DistalFull                                                        +---------+---------------+---------+-----------+----------+--------------+ PFV      Full                                                        +---------+---------------+---------+-----------+----------+--------------+ POP      Full           Yes      Yes                                 +---------+---------------+---------+-----------+----------+--------------+ PTV      Full                                                        +---------+---------------+---------+-----------+----------+--------------+ PERO     Full                                                        +---------+---------------+---------+-----------+----------+--------------+     Summary: RIGHT: - There is no evidence of deep vein thrombosis in the lower extremity.  - No cystic  structure found in the popliteal fossa.  LEFT: - There is no evidence of deep vein thrombosis in the lower extremity.  - No  cystic structure found in the popliteal fossa.  *See table(s) above for measurements and observations. Electronically signed by Monica Martinez MD on 03/03/2020 at 3:26:05 PM.    Final        Medical Problem List and Plan: 1.  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  Antithrombotics: -DVT/anticoagulation:  Pharmaceutical: Lovenox   --BLE dopplers negative for DVT  -antiplatelet therapy: N/a 3. Pain Management: Continue oxycodone --increase to 10 mg q 4 hrs prn--d/c IV dilaudid.  4. Mood: LCSW to follow for evaluation and support.   -antipsychotic agents: N/a 5. Neuropsych: This patient is capable of making decisions on his own behalf. 6. Skin/Wound Care: Routine pressure relief measures.  7. Fluids/Electrolytes/Nutrition: Strict I/O. Check lytes in am.  8. Acute renal failure: Improving 81/4.92-->62/1.49 9. Hyponatremia: Recurrent hyponatremia  10. New diagnosis T2DM: Hgb A1c-6.5. BS poorly controlled due to steriods as well as Ensure tid between meals. Will change ensure to Ensure Max --continue Lantus 10 units at bedtime with 3 units meal coverage tid as well as SSI for elevated BS.  11. Constipation: Continue miralax am/change senna to lunch. KUB done showing ileus?  Wife/patient reports lot of gas and that he has been having BM daily. Pt says as well, BM daily.  12. Mild BPH: will monitor voiding with PVR checks.  13. 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 Pathologic Fx T6 and T9:  Treated with XRT 10/10 and high dose steroids.  15. L foot drop- get prevalon boot on L at night  I spent a total of 85 minutes on admission today due to - tele-interpretor taking longer- spent 60 minutes in direct contact  with pt; 15 minutes reviewing chart and another 10 minutes doing H&P.  Olin Hauser. SErling Cruz, PA-C 03/03/2020    I have personally performed a face to face diagnostic evaluation of this patient and formulated the key components of the plan.  Additionally, I have personally reviewed laboratory data, imaging studies, as well as relevant notes and concur with the physician assistant's documentation above.   The patient's status has not changed from the original H&P.  Any changes in documentation from the acute care chart have been noted above.     Courtney Heys, MD 03/03/2020

## 2020-03-03 NOTE — Progress Notes (Signed)
Inpatient Rehabilitation Medication Review by a Pharmacist  A complete drug regimen review was completed for this patient to identify any potential clinically significant medication issues.  Clinically significant medication issues were identified:  yes   Type of Medication Issue Identified Description of Issue Urgent (address now) Non-Urgent (address on AM team rounds) Plan   Drug Interaction(s) (clinically significant)       Duplicate Therapy       Allergy       No Medication Administration End Date  Lokelma scheduled BID with no end Non-urgent Monitor potassium level and discontinue Lokelma when able. Consider changing diet to include low-potassium restriction   Incorrect Dose       Additional Drug Therapy Needed       Other  -PTA Lisinopril-HCTZ 20-25mg  held -PTA simvastatin 20mg  not restarted -Patient was not on insulin before and does not appeared to have tried oral diabetic medications before   Non-urgent  -Started on amlodipine 10mg  instead, monitor -Consider restart simvastatin or switch to atorvastatin for fewer side effects (LDL 109 on 02/25/20) -Consider switching from insulin to metformin+SGLT2 or GLP1 instead for improved adherence at discharge. Will need patient assistance if switched      Name of provider notified for urgent issues identified:   Provider Method of Notification:     For non-urgent medication issues to be resolved on team rounds tomorrow morning a [facility] Secure Chat Handoff was sent to: Algis Liming, PA   Time spent performing this drug regimen review (minutes):  [place], PharmD, [place], [facility] Clinical Pharmacist  Please check AMION for all [place] phone numbers After 10:00 PM, call [place]

## 2020-03-03 NOTE — Progress Notes (Signed)
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 Patient does have green card for [place]   Financial Counselor:       Phone#:    The "Data Collection Information Summary" for patients in Inpatient Rehabilitation Facilities with attached "Privacy Act [place] Records" was provided and verbally reviewed with: N/A   Emergency Contact Information         Contact Information     Name Relation Home Work Mobile    Kurtis, Anastasia Spouse     [phone]    Gurshaan, [place] Daughter     [phone]         Current Medical History  Patient Admitting Diagnosis: Debility   History of Present Illness: 54 year old male with no significant medical history. [facility] ED at [facility] with generalized weakness, left rib pain, lower back pain and constipation for one month. History of fall from ladder one month prior to admit landing on his left side followed by back pain, rib pain and BLE weakness numbness and tingling. Previously evaluated in ED on 01/26/2020 after his fall. No acute abnormalities on imaging and he was discharged home. F/u with orthopedics.   Presented back to ed 02/14/2020 with ongoing LE weakness and pain throughout. Underwent CT chest/abdomen/pelvis which then revealed multiple lytic lesions throughout the skeleton as well as acute left  9th and 10 th rib fractures. There was concern for multiple myeloma and workup initiated with oncology consultation. Underwent CT guided bone marrow biopsy on 02/19/2020. SPEP did reveal an M spike. Hypercalcemia felt secondary to underlying multiple myeloma and treated with pamidronate on 8/10 and given a few days of calcitonin.    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 No plan for surgical intervention.  Radiation oncology consulted and he completed planned 10 day course of radiation on 03/02/2020. Bone marrow biopsy did reveal positive for neoplasm kappa light chain consistent with multiple myeloma. Plan to initiate chemo once XRT is complete. Plan likely Velcade sq, dexamethasone and oral cytoxan per oncology weekly per [facility] NP.   New diagnosis of type 2 Diabetes with Hgb A1c 6.5. Also on steroids. Continue sliding scale insulin. May consider oral antihyperglycemics. On amlodipine and Coreg for HTN. Mouth pain treated with viscous lidocaine and magic mouthwash with no finding of oral thrush.   He developed progressive renal failure and was followed by Nephrology. He was started of RRT due to uremic signs., He underwent dialysis on 8/11 and required 2 sessions. Renal function then improved and no further dialysis expected.    Patient's medical [facility]  has been reviewed by the rehabilitation admission coordinator and physician.   Past Medical History      Past Medical History:  Diagnosis Date  .  AKI (acute kidney injury) ([place]) 02/2020  . Hypercalcemia 02/2020  . Hypercholesteremia    . Hypertension        Family History   family history is not on file.   Prior Rehab/Hospitalizations Has the patient had prior rehab or hospitalizations prior to admission? Yes   Has the patient had major surgery during 100 days prior to admission? Yes              Current Medications   Current Facility-Administered  Medications:  .  (feeding supplement) PROSource Plus liquid 30 mL, 30 mL, Oral, BID BM, Adhikari, Amrit, MD, 30 mL at 03/02/20 1337 .  acetaminophen (TYLENOL) tablet 650 mg, 650 mg, Oral, Q6H PRN, 650 mg at 03/02/20 1922 **OR** acetaminophen (TYLENOL) suppository 650 mg, 650 mg, Rectal, Q6H PRN, Pahwani, Rinka R, MD .  amLODipine (NORVASC) tablet 10 mg, 10 mg, Oral, Daily, Reesa Chew, MD, 10 mg at 03/03/20 1040 .  bisacodyl (DULCOLAX) suppository 10 mg, 10 mg, Rectal, Daily PRN, Raiford Noble Latif, DO, 10 mg at 02/18/20 1954 .  carvedilol (COREG) tablet 3.125 mg, 3.125 mg, Oral, BID WC, Florencia Reasons, MD, 3.125 mg at 03/03/20 0841 .  Chlorhexidine Gluconate Cloth 2 % PADS 6 each, 6 each, Topical, Q0600, Dwana Melena, MD, 6 each at 03/03/20 [phone] .  chlorproMAZINE (THORAZINE) tablet 25 mg, 25 mg, Oral, QID PRN, Dwyane Dee, MD, 25 mg at 03/03/20 0846 .  clonazepam (KLONOPIN) disintegrating tablet 0.25 mg, 0.25 mg, Oral, TID PRN, Florencia Reasons, MD, 0.25 mg at 02/25/20 2104 .  dexamethasone (DECADRON) injection 4 mg, 4 mg, Intravenous, Q8H, Sheikh, Omair Pinch, DO, 4 mg at 03/03/20 0545 .  feeding supplement (ENSURE ENLIVE) (ENSURE ENLIVE) liquid 237 mL, 237 mL, Oral, TID BM, Sheikh, Omair Latif, DO, 237 mL at 03/03/20 0842 .  heparin injection 5,000 Units, 5,000 Units, Subcutaneous, Q8H, Allred, Darrell K, PA-C, 5,000 Units at 03/03/20 0545 .  hydrALAZINE (APRESOLINE) injection 10 mg, 10 mg, Intravenous, Q6H PRN, Raiford Noble Latif, DO, 10 mg at 02/25/20 1718 .  HYDROmorphone (DILAUDID) injection 0.5-1 mg, 0.5-1 mg, Intravenous, Q2H PRN, Pahwani, Rinka R, MD, 1 mg at 03/02/20 0525 .  insulin aspart (novoLOG) injection 0-9 Units, 0-9 Units, Subcutaneous, TID WC, Florencia Reasons, MD, 2 Units at 03/03/20 [phone] .  insulin aspart (novoLOG) injection 3 Units, 3 Units, Subcutaneous, TID WC, Shelly Coss, MD, 3 Units at 03/03/20 [phone] .  insulin glargine (LANTUS) injection 10 Units, 10 Units, Subcutaneous, QHS,  Shelly Coss, MD, 10 Units at 03/02/20 2110 .  lidocaine (LIDODERM) 5 % 1 patch, 1 patch, Transdermal, Q24H, Nolberto Hanlon, MD, 1 patch at 03/02/20 1337 .  lidocaine (XYLOCAINE) 2 % viscous mouth solution 15 mL, 15 mL, Mouth/Throat, Q6H PRN, Florencia Reasons, MD, 15 mL at 02/28/20 0140 .  ondansetron (ZOFRAN) tablet 4 mg, 4 mg, Oral, Q6H PRN, 4 mg at 02/15/20 1616 **OR** ondansetron (ZOFRAN) injection 4 mg, 4 mg, Intravenous, Q6H PRN, Pahwani, Rinka R, MD, 4 mg at 02/17/20 0020 .  oxyCODONE (Oxy IR/ROXICODONE) immediate release tablet 5 mg, 5 mg, Oral, Q4H PRN, Pahwani, Rinka R, MD, 5 mg at 03/03/20 0545 .  pantoprazole (PROTONIX) EC tablet 40 mg, 40 mg, Oral, Daily, [place], Yujing Z, RPH, 40 mg at 03/03/20 1041 .  phenol (CHLORASEPTIC) mouth spray 1 spray, 1 spray, Mouth/Throat, PRN, Florencia Reasons, MD, 1 spray at 02/25/20 [phone] .  polyethylene glycol (MIRALAX / GLYCOLAX) packet 17 g, 17 g, Oral, Daily PRN, Raiford Noble [place],  DO, 17 g at 02/24/20 1456 .  polyethylene glycol (MIRALAX / GLYCOLAX) packet 17 g, 17 g, Oral, Daily, Florencia Reasons, MD, 17 g at 03/03/20 1040 .  senna-docusate (Senokot-S) tablet 1 tablet, 1 tablet, Oral, QHS PRN, Raiford Noble Dwight, DO, 1 tablet at 02/28/20 2217 .  senna-docusate (Senokot-S) tablet 1 tablet, 1 tablet, Oral, BID, Florencia Reasons, MD, 1 tablet at 03/03/20 1040 .  simethicone (MYLICON) chewable tablet 80 mg, 80 mg, Oral, QID PRN, Raiford Noble Merlin, DO, 80 mg at 03/02/20 5397 .  sodium chloride flush (NS) 0.9 % injection 3 mL, 3 mL, Intravenous, Once, Virgel Manifold, MD .  sodium zirconium cyclosilicate (LOKELMA) packet 10 g, 10 g, Oral, BID, Adhikari, Amrit, MD, 10 g at 03/03/20 1045   Patients Current Diet:     Diet Order                      Diet Carb Modified Fluid consistency: Thin; Room service appropriate? Yes  Diet effective now                      Precautions / Restrictions Precautions Precautions: Back, Fall, Other (comment) Precaution Comments:  Significant BLE weakness and ataxia-like movements (LLE>RLE); back and rib precautions for comfort due to fxs Restrictions Weight Bearing Restrictions: No    Has the patient had 2 or more falls or a fall with injury in the past year? 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 Independent   Indoor Mobility: Did the patient need assistance with walking from room to room (with or without device)? Independent   Stairs: Did the patient need assistance with internal or external stairs (with or without device)? Independent   Functional Cognition: Did the patient need help planning regular tasks such as shopping or remembering to take medications? Independent   Home Assistive Devices / Equipment Home Assistive Devices/Equipment: None Home Equipment: None   Prior Device Use: Indicate devices/aids used by the patient prior to current illness, exacerbation or injury? None of the above   Current Functional Level Cognition   Overall Cognitive Status: Within Functional Limits for tasks assessed Orientation Level: Oriented X4 General Comments: pt able to understand basic [place], RN assisted with transfer with [place] and speaks Spanish    Extremity Assessment (includes Sensation/Coordination)   Upper Extremity Assessment: Generalized weakness  Lower Extremity Assessment: Generalized weakness RLE Sensation: WNL LLE Sensation: WNL     ADLs   Overall ADL's : Needs assistance/impaired Eating/Feeding: Minimal assistance, Bed level Grooming: Moderate assistance Grooming Details (indicate cue type and reason): pt requires external support to maintain unsupported sitting balance. He has tendency to heavily brace self with BUEs Upper Body Bathing: Minimal assistance, Sitting Lower Body Bathing: Moderate assistance, Sit to/from stand Upper Body  Dressing : Minimal assistance, Sitting Lower Body Dressing: Maximal assistance, Sit to/from stand Lower Body Dressing Details (indicate cue type and reason): pt able to initiate figure 4 crossing but unabel to reach for LB dressing. Pt L LE is weaker than R LE Toilet Transfer: Maximal assistance Toilet Transfer Details (indicate cue type and reason): max A for sit <> stand in stedy frame. Stedy then placed over toilet with BSC over top for added leverage Toileting- Clothing Manipulation and Hygiene: Total assistance, Sit to/from stand Toileting - Clothing Manipulation Details (indicate cue type and  reason): pt requiring total A from OT for posterior peri care after BM while standing with BUE support in stedy frame Functional mobility during ADLs: Maximal assistance (to sit <> stand in stedy) General ADL Comments: pt had returned from radiation.  Pt agreed to gentle BUE AROM with yellow theraband.  Pt willing and seemed to enjoy exercises.     Mobility   Overal bed mobility: Needs Assistance Bed Mobility: Rolling, Sidelying to Sit, Sit to Supine Rolling: Min assist Sidelying to sit: Mod assist Supine to sit: Mod assist Sit to supine: Mod assist Sit to sidelying: Min assist General bed mobility comments: pt able to assist with rolling using upper body, assist for positioning LEs; assist for LEs over EOB and pt able to assist with trunk upright with hand assist from sidelying; pt returned to bed with assist for LEs (very fatigued and returning to bed prior to cues for log roll technique so assisted with lower body)     Transfers   Overall transfer level: Needs assistance Equipment used: Ambulation equipment used Transfer via Lift Equipment: Stedy Transfers: Sit to/from Stand Sit to Stand: Max assist, +2 safety/equipment Stand pivot transfers: Total assist General transfer comment: pt assisted with sit to stands within [place] for assist to/from toilet (requested BM on [facility]), pt reliant on knees  blocked and assisted with hip extension     Ambulation / Gait / Stairs / Wheelchair Mobility   Ambulation/Gait Ambulation/Gait assistance: Herbalist (Feet): 6 Feet Assistive device: Rolling walker (2 wheeled) Gait Pattern/deviations: Wide base of support General Gait Details: unable to safely attempt Gait velocity: decreased     Posture / Balance Dynamic Sitting Balance Sitting balance - Comments: pt able to briefly sit with hand on lap however pain in ribs also limiting so required bil UE support, pt performed one UE support with other lifting to tap therapist's hand x10 Balance Overall balance assessment: Needs assistance Sitting-balance support: Feet supported, Bilateral upper extremity supported Sitting balance-Leahy Scale: Poor Sitting balance - Comments: pt able to briefly sit with hand on lap however pain in ribs also limiting so required bil UE support, pt performed one UE support with other lifting to tap therapist's hand x10 Standing balance support: Bilateral upper extremity supported, During functional activity Standing balance-Leahy Scale: Zero Standing balance comment: reliant on BUE support, use of stedy, and external assist from therapist     Special needs/care consideration Designated visitor to be Verdis Frederickson on admission Spanish speaker translator to be provided New Diagnosis of Type 2 Diabetes  Has completed 10 day course of radiation Planned chemo to be initiated which will likely be Velcade sq, dexamethasone and cytoxan oral weekly per oncology    Previous Home Environment  Living Arrangements: Spouse/significant other, Children (spouse and 27 year old daughter)  Lives With: Spouse, Daughter Available Help at Discharge:  (wife works Education administrator houses) Type of Home: [employer] Layout: One level Home Access: Stairs to enter Entrance Stairs-Rails: Building surveyor of Steps: 5 Bathroom Shower/Tub: Multimedia programmer:  Standard Bathroom Accessibility: Yes How Accessible: Accessible via walker Home Care Services: No   Discharge Living Setting Plans for Discharge Living Setting: Patient's home, Lives with (comment) (wife and 108 year old daughter) Type of Home at Discharge: House Discharge Home Layout: One level Discharge Home Access: Stairs to enter Entrance Stairs-Rails: Right Entrance Stairs-Number of Steps: 5 Discharge Bathroom Shower/Tub: Walk-in shower Discharge Bathroom Toilet: Standard Discharge Bathroom Accessibility: Yes How Accessible: Accessible via walker Does the  patient have any problems obtaining your medications?: Yes (Describe) (uninsured)   Social/Family/Support Systems Patient Roles: Spouse, 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 Information: wife, Verdis Frederickson Anticipated Caregiver: wife and 60 year old daughter Anticipated Caregiver's Contact Information: see above Caregiver Availability: 24/7 Discharge Plan Discussed with Primary Caregiver: Yes Is Caregiver In Agreement with Plan?: Yes Does Caregiver/Family have Issues with Lodging/Transportation while Pt is in Rehab?: No   Goals Patient/Family Goal for Rehab: supervision to min asisst with PT and OT Expected length of stay: ELOS 2 to 3 weeks Cultural Considerations: Spanish speaker Pt/Family Agrees to Admission and willing to participate: Yes Program Orientation Provided & Reviewed with Pt/Caregiver Including Roles  & Responsibilities: Yes    Likely wheelchair level goals   Decrease burden of Care through IP rehab admission:n/a   Possible need for SNF placement upon discharge: not anticipated   Patient Condition: I have reviewed medical records [facility] hospital and [facility] , spoken with CSW, and patient and spouse. I discussed via phone for inpatient rehabilitation assessment.  Patient will benefit from ongoing PT and OT, can actively participate in 3 hours of therapy a day 5 days of the week, and can make measurable  gains during the admission.  Patient will also benefit from the coordinated team approach during an Inpatient Acute Rehabilitation admission.  The patient will receive intensive therapy as well as Rehabilitation physician, nursing, social worker, and care management interventions.  Due to bladder management, bowel management, safety, skin/wound care, disease management, medication administration, pain management and patient education the patient requires 24 hour a day rehabilitation nursing.  The patient is currently mod to max assist with mobility and basic ADLs.  Discharge setting and therapy post discharge at home with home health is anticipated.  Patient has agreed to participate in the Acute Inpatient Rehabilitation Program and will admit today.   Preadmission Screen Completed By:  Cleatrice Burke, 03/03/2020 11:18 AM ______________________________________________________________________   Discussed status with Dr. Dagoberto Ligas  on  03/03/2020 at  1118 and received approval for admission today.   Admission Coordinator:  Cleatrice Burke, RN, time  1025 Date 03/03/2020    Assessment/Plan: Diagnosis: 1. Does the need for close, 24 hr/day Medical supervision in concert with the patient's rehab needs make it unreasonable for this patient to be served in a less intensive setting? Yes 2. 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 Due to bladder management, bowel management, safety, skin/wound care, disease management, medication administration, pain management and patient education, does the patient require 24 hr/day rehab nursing? Yes 4. Does the patient require coordinated care of a physician, rehab nurse, PT, OT, and SLP to address physical and functional deficits in the context of the above medical diagnosis(es)? Yes Addressing deficits in the  following areas: balance, endurance, locomotion, strength, transferring, bowel/bladder control, bathing, dressing, feeding, grooming and toileting 5. Can the patient actively participate in an intensive therapy program of at least 3 hrs of therapy 5 days a week? Yes 6. The potential for patient to make measurable gains while on inpatient rehab is good 7. Anticipated functional outcomes upon discharge from inpatient rehab: supervision and min assist PT, supervision and min assist OT, n/a SLP 8. Estimated rehab length of stay to reach the above functional goals is: 2-3 weeks 9. Anticipated discharge destination: Home 10. Overall Rehab/Functional Prognosis: good     MD Signature: **        Revision  History                          Note Details  Jan Fireman, MD File Time 03/03/2020 11:23 AM  Author Type Physician Status Signed  Last Editor Courtney Heys, MD Service Physical Medicine and [place] # [id number] Admit Date 03/03/2020

## 2020-03-04 ENCOUNTER — Inpatient Hospital Stay (HOSPITAL_COMMUNITY): Payer: Self-pay | Admitting: Physical Therapy

## 2020-03-04 ENCOUNTER — Inpatient Hospital Stay (HOSPITAL_COMMUNITY): Payer: Self-pay | Admitting: Occupational Therapy

## 2020-03-04 LAB — CBC WITH DIFFERENTIAL/PLATELET
Abs Immature Granulocytes: 0.43 10*3/uL — ABNORMAL HIGH (ref 0.00–0.07)
Basophils Absolute: 0 10*3/uL (ref 0.0–0.1)
Basophils Relative: 0 %
Eosinophils Absolute: 0 10*3/uL (ref 0.0–0.5)
Eosinophils Relative: 0 %
HCT: 30.2 % — ABNORMAL LOW (ref 39.0–52.0)
Hemoglobin: 10.4 g/dL — ABNORMAL LOW (ref 13.0–17.0)
Immature Granulocytes: 5 %
Lymphocytes Relative: 4 %
Lymphs Abs: 0.3 10*3/uL — ABNORMAL LOW (ref 0.7–4.0)
MCH: 28.8 pg (ref 26.0–34.0)
MCHC: 34.4 g/dL (ref 30.0–36.0)
MCV: 83.7 fL (ref 80.0–100.0)
Monocytes Absolute: 0.4 10*3/uL (ref 0.1–1.0)
Monocytes Relative: 5 %
Neutro Abs: 6.7 10*3/uL (ref 1.7–7.7)
Neutrophils Relative %: 86 %
Platelets: 121 10*3/uL — ABNORMAL LOW (ref 150–400)
RBC: 3.61 MIL/uL — ABNORMAL LOW (ref 4.22–5.81)
RDW: 11.6 % (ref 11.5–15.5)
WBC: 7.9 10*3/uL (ref 4.0–10.5)
nRBC: 0 % (ref 0.0–0.2)

## 2020-03-04 LAB — COMPREHENSIVE METABOLIC PANEL
ALT: 28 U/L (ref 0–44)
AST: 35 U/L (ref 15–41)
Albumin: 2.7 g/dL — ABNORMAL LOW (ref 3.5–5.0)
Alkaline Phosphatase: 115 U/L (ref 38–126)
Anion gap: 12 (ref 5–15)
BUN: 62 mg/dL — ABNORMAL HIGH (ref 6–20)
CO2: 20 mmol/L — ABNORMAL LOW (ref 22–32)
Calcium: 11.8 mg/dL — ABNORMAL HIGH (ref 8.9–10.3)
Chloride: 90 mmol/L — ABNORMAL LOW (ref 98–111)
Creatinine, Ser: 1.53 mg/dL — ABNORMAL HIGH (ref 0.61–1.24)
GFR calc Af Amer: 58 mL/min — ABNORMAL LOW (ref >60)
GFR calc non Af Amer: 50 mL/min — ABNORMAL LOW (ref >60)
Glucose, Bld: 148 mg/dL — ABNORMAL HIGH (ref 70–99)
Potassium: 4.8 mmol/L (ref 3.5–5.1)
Sodium: 122 mmol/L — ABNORMAL LOW (ref 135–145)
Total Bilirubin: 0.7 mg/dL (ref 0.3–1.2)
Total Protein: 6.7 g/dL (ref 6.5–8.1)

## 2020-03-04 LAB — GLUCOSE, CAPILLARY
Glucose-Capillary: 165 mg/dL — ABNORMAL HIGH (ref 70–99)
Glucose-Capillary: 188 mg/dL — ABNORMAL HIGH (ref 70–99)
Glucose-Capillary: 267 mg/dL — ABNORMAL HIGH (ref 70–99)
Glucose-Capillary: 309 mg/dL — ABNORMAL HIGH (ref 70–99)

## 2020-03-04 LAB — SODIUM, URINE, RANDOM: Sodium, Ur: 22 mmol/L

## 2020-03-04 LAB — OSMOLALITY, URINE: Osmolality, Ur: 604 mOsm/kg (ref 300–900)

## 2020-03-04 LAB — OSMOLALITY: Osmolality: 289 mOsm/kg (ref 275–295)

## 2020-03-04 MED ORDER — ZOLEDRONIC ACID 4 MG/5ML IV CONC
4.0000 mg | Freq: Once | INTRAVENOUS | Status: AC
  Administered 2020-03-04: 4 mg via INTRAVENOUS
  Filled 2020-03-04: qty 5

## 2020-03-04 MED ORDER — SODIUM ZIRCONIUM CYCLOSILICATE 10 G PO PACK
10.0000 g | PACK | Freq: Every day | ORAL | Status: DC
  Administered 2020-03-05 – 2020-03-07 (×3): 10 g via ORAL
  Filled 2020-03-04 (×3): qty 1

## 2020-03-04 MED ORDER — BACLOFEN 10 MG PO TABS
10.0000 mg | ORAL_TABLET | Freq: Three times a day (TID) | ORAL | Status: DC | PRN
  Administered 2020-03-04 – 2020-03-07 (×3): 10 mg via ORAL
  Filled 2020-03-04 (×3): qty 1

## 2020-03-04 MED ORDER — SODIUM CHLORIDE 0.9 % IV SOLN: INTRAVENOUS | Status: DC

## 2020-03-04 NOTE — Evaluation (Signed)
Occupational Therapy Assessment and Plan  Patient Details  Name: Trevor Shelton MRN: [id number] Date of Birth: 11-03-1965  OT Diagnosis: abnormal posture, acute pain, muscle weakness (generalized) and paraparesis LB Rehab Potential: Rehab Potential (ACUTE ONLY): Good ELOS: 18-21 days   Today's Date: 03/04/2020 OT Individual Time: 1000-1100 OT Individual Time Calculation (min): 60 min     Hospital Problem: Principal Problem:   Incomplete paraplegia ([place]) Active Problems:   Cord compression [facility])   Past Medical History:  Past Medical History:  Diagnosis Date  . AKI (acute kidney injury) ([place]) 02/2020  . Hypercalcemia 02/2020  . Hypercholesteremia   . Hypertension    Past Surgical History:  Past Surgical History:  Procedure Laterality Date  . HERNIA REPAIR    . IR FLUORO GUIDE CV LINE RIGHT  02/17/2020  . IR REMOVAL TUN CV CATH W/O FL  03/01/2020  . IR US GUIDE VASC ACCESS RIGHT  02/17/2020    Assessment & Plan Clinical Impression: Trevor Shelton is a 54 year old male in relatively good health except for a fall 4 feed off a ladder  01/26/20 with onset of back, left rib and progressive BLE weakness. He was evaluated by ortho on outpatient basis with treatment but continued to worsen with reports of burning pain in the back as well as decrease in UOP and constipation. He was admitted via ED for work up on 02/14/20 and found to have diffuse lytic lesions throughout the skeleton, epidural tumor extension with pathologic Fx of T6- (50% loss of ht)and T9 (10% loss of ht with  78m retropulsion), contrast enhancing mass replacing most of T5 vertebral body with severe cord compression, acute fracture of left 9 and 10 ribs. Work up concerning for MM as he was also found to have hypercalcemia with ARF and significant hyponatremia. Dr LAugustin Coupefelt recommended IVF for aggressive hydration as well as calcitonin,  Zometa and pamidronate for management of hypercalcemia. hyponatremia felt to be due to  paraproteinemia.    Dr. TNoni Sauperecommended XRT for high grade epidural disease as well as IV decadron for cord compression.    He required RRT briefly due to signs of uremia and as renal status improving with rise in Na to 133-->nephrology signed off.  24 hours urine showed M protein and modestly elevated Kappa ratio and bone marrow biopsy showed 20-30% plasma cells. Patient with newly diagnosed IgG Kappa MM and Dr. DLorenso Courierplans on CyBorD chemo to start prior to discharge?. He completes 10/10 doses of XRT and to consider oral po lxazomib/cytoxan/dexamethasone (to reduce burden of care) immediately following XRT.    BUN/SCr steadily improving but sodium continues to decline down to 123 today. Lokelmia added due to onset of hyperkalemia.  Blood sugars poorly controlled and novolog added for better control.  He has had issues with decline in po intake in the past 3 days (does not like our food), pain extending from back around his ribs, as well as BLE weakness. Has been going to BR after meals due to gas--continent and having BM daily per wife/patient. He feels that his voiding--dysuria has improved. He was working and independent till 2 weeks PTA.   Therapy has been ongoing and working on pregait activity. CIR recommended due to functional decline.    Patient transferred to CIR on 03/03/2020 .    Patient currently requires max with basic self-care skills secondary to muscle weakness, muscle joint tightness and muscle paralysis, decreased cardiorespiratoy endurance, abnormal tone, unbalanced muscle activation, ataxia and decreased coordination, peripheral  and decreased sitting balance, decreased standing balance, decreased postural control and decreased balance strategies.  Prior to hospitalization, patient was fully independent and working.  Patient will benefit from skilled intervention to increase independence with basic self-care skills prior to discharge home with care partner.  Anticipate patient will  require minimal physical assistance and follow up home health.  OT - End of Session Activity Tolerance: Decreased this session Endurance Deficit: Yes OT Assessment Rehab Potential (ACUTE ONLY): Good OT Patient demonstrates impairments in the following area(s): Balance;Endurance;Pain;Motor;Other (Comment) (dizziness) OT Basic ADL's Functional Problem(s): Bathing;Dressing;Toileting OT Transfers Functional Problem(s): Toilet;Tub/Shower OT Additional Impairment(s): None OT Plan OT Intensity: Minimum of 1-2 x/day, 45 to 90 minutes OT Frequency: 5 out of 7 days OT Duration/Estimated Length of Stay: 18-21 days OT Treatment/Interventions: Balance/vestibular training;Discharge planning;DME/adaptive equipment instruction;Functional mobility training;Neuromuscular re-education;Pain management;Psychosocial support;Patient/family education;Self Care/advanced ADL retraining;Therapeutic Activities;Therapeutic Exercise;UE/LE Strength taining/ROM;UE/LE Coordination activities OT Self Feeding Anticipated Outcome(s): Independent OT Basic Self-Care Anticipated Outcome(s): Min A OT Toileting Anticipated Outcome(s): Min A OT Bathroom Transfers Anticipated Outcome(s): Min A OT Recommendation Recommendations for Other Services: Vestibular eval Patient destination: Home Follow Up Recommendations: Home health OT Equipment Recommended: 3 in 1 bedside comode;Tub/shower seat   OT Evaluation Precautions/Restrictions  Precautions Precautions: Back;Fall (per chart review, no specific back precautions however encouraged per pt comfort) Precaution Comments: Significant BLE weakness and ataxia-like movements (LLE>RLE); back and rib precautions for comfort due to fxs Restrictions Weight Bearing Restrictions: No   Pain Pain Assessment Pain Scale: 0-10 Pain Score: 6  Pain Type: Acute pain Pain Location: Flank Pain Orientation: Left Pain Descriptors / Indicators: Discomfort;Aching Pain Onset: On-going Pain  Intervention(s): Medication (See eMAR);Distraction;Rest;Repositioned Multiple Pain Sites: No Home Living/Prior Functioning Home Living Family/patient expects to be discharged to:: Private residence Living Arrangements: Spouse/significant other, Children Available Help at Discharge: Family, Available 24 hours/day Type of Home: House Home Access: Stairs to enter [employer] of Steps: 5 Entrance Stairs-Rails: Can reach both, Right, Left Home Layout: One level Bathroom Shower/Tub: Holiday representative Accessibility: Yes  Lives With: Spouse Prior Function Level of Independence: Independent with homemaking with ambulation, Independent with basic ADLs, Independent with transfers  Able to Take Stairs?: Yes Driving: Yes Comments: Working as a Curator. Drives. Wife cleans houses but able to be home with him as needed. Reports increasing difficulty walking at home, heaviness and weakness in legs but worse in hospital. Vision Baseline Vision/History: No visual deficits Patient Visual Report: No change from baseline Vision Assessment?: No apparent visual deficits Additional Comments: pt did have increased sensation of dizziness with head turns with visual fixation Perception  Perception: Within Functional Limits Praxis Praxis: Intact Cognition Overall Cognitive Status: Within Functional Limits for tasks assessed Arousal/Alertness: Awake/alert Orientation Level: Person;Place;Situation Person: Oriented Place: Oriented Situation: Oriented Year: 2021 Month: August Day of Week: Correct Memory: Appears intact Immediate Memory Recall: Sock;Blue;Bed Memory Recall Sock: Without Cue Memory Recall Blue: Without Cue Memory Recall Bed: Without Cue Awareness: Appears intact Safety/Judgment: Appears intact Sensation Sensation Light Touch: Impaired by gross assessment (pt able to repond correctly to 5/5 R and L LE light touch with EC however pt reports his L foot "does not feel the  same" as right) Coordination Gross Motor Movements are Fluid and Coordinated: No Fine Motor Movements are Fluid and Coordinated: No Motor  Motor Motor: Paraplegia;Ataxia  Trunk/Postural Assessment  Cervical Assessment Cervical Assessment: Exceptions to [facility] Thoracic Assessment Thoracic Assessment: Exceptions to [facility] Lumbar Assessment Lumbar Assessment: Exceptions to [facility] Postural Control Postural Control: Deficits on evaluation (  forward head carriage, posterior pelvic tilt, kyphotic)  Also with L lateral trunk curvature Balance Balance Balance Assessed: Yes Dynamic Sitting Balance Sitting balance - Comments: static sitting CGA; dynamic sitting mod A; unable to assess any standing balance 2/2 safety concerns Extremity/Trunk Assessment RUE Assessment RUE Assessment: Within Functional Limits General Strength Comments: B rib pain with resistance LUE Assessment LUE Assessment: Within Functional Limits General Strength Comments: B rib pain with resistance  Care Tool Care Tool Self Care Eating   Eating Assist Level: Set up assist    Oral Care    Oral Care Assist Level: Set up assist    Bathing   Body parts bathed by patient: Right arm;Left arm;Chest;Abdomen;Front perineal area (UB only today)     Assist Level: Moderate Assistance - Patient 50 - 74%    Upper Body Dressing(including orthotics)   What is the patient wearing?: Pull over shirt   Assist Level: Set up assist    Lower Body Dressing (excluding footwear)   What is the patient wearing?: Pants Assist for lower body dressing: Maximal Assistance - Patient 25 - 49%    Putting on/Taking off footwear   What is the patient wearing?: Non-skid slipper socks Assist for footwear: Maximal Assistance - Patient 25 - 49%       Care Tool Toileting Toileting activity   Assist for toileting: Total Assistance - Patient < 25% (on toilet)     Care Tool Bed Mobility Roll left and right activity   Roll left and right assist  level: Maximal Assistance - Patient 25 - 49%    Sit to lying activity   Sit to lying assist level: Maximal Assistance - Patient 25 - 49%    Lying to sitting edge of bed activity   Lying to sitting edge of bed assist level: Maximal Assistance - Patient 25 - 49%     Care Tool Transfers Sit to stand transfer Sit to stand activity did not occur: Safety/medical concerns      Chair/bed transfer   Chair/bed transfer assist level: 2 Armed forces training and education officer transfer activity did not occur: Safety/medical concerns Assist Level: Total Assistance - Patient < 25% (stedy transfers)     Care Tool Cognition Expression of Ideas and Wants Expression of Ideas and Wants: Some difficulty - exhibits some difficulty with expressing needs and ideas (e.g, some words or finishing thoughts) or speech is not clear   Understanding Verbal and Non-Verbal Content Understanding Verbal and Non-Verbal Content: Usually understands - understands most conversations, but misses some part/intent of message. Requires cues at times to understand   Memory/Recall Ability *first 3 days only Memory/Recall Ability *first 3 days only: Current season;Location of own room;That he or she is in a hospital/hospital unit    Refer to Care Plan for [place] 1 OT Short Term Goal 1 (Week 1): Pt will be able to sit to EOB with min A. OT Short Term Goal 2 (Week 1): Pt will complete slide board transfer with mod A to prepare for toilet transfers. OT Short Term Goal 3 (Week 1): Pt will be able to push to stand with a RW with max A. OT Short Term Goal 4 (Week 1): from bed level, pt will don pants with mod A.  Recommendations for other services: None    Skilled Therapeutic Intervention ADL ADL Eating: Independent Grooming: Independent Where Assessed-Grooming: Sitting at sink Upper Body Bathing: Setup Where Assessed-Upper Body Bathing: Sitting at sink  Lower Body Bathing: Maximal  assistance Where Assessed-Lower Body Bathing: Sitting at sink Upper Body Dressing: Setup Where Assessed-Upper Body Dressing: Sitting at sink Lower Body Dressing: Dependent Where Assessed-Lower Body Dressing: Bed level Toileting: Dependent Where Assessed-Toileting: Glass blower/designer: Maximal Print production planner Method: Other (comment) (stedy) Toilet Transfer Equipment: Raised toilet seat Mobility  Bed Mobility Bed Mobility: Rolling Left;Rolling Right;Supine to Sit;Sitting - Scoot to Edge of Bed Rolling Right: Maximal Assistance - Patient 25-49% Rolling Left: Maximal Assistance - Patient 25-49% Supine to Sit: Maximal Assistance - Patient - Patient 25-49% Sitting - Scoot to Edge of Bed: Dependent - Patient equal 0%   Pt seen for initial evaluation, explained role of OT to pt and discussed his goals.  Focused on UB self care and LB self care with use of stedy. Pt able to stand up in stedy with mod-max A of 1 using his R hand on bar and left to push up with.   At end of session, pt needed to toilet. Used stedy to transfer to [facility] over toilet. Pt needed to sit for some time.  Session time over, so his NT alerted pt would use call light to call for help.     Discharge Criteria: Patient will be discharged from OT if patient refuses treatment 3 consecutive times without medical reason, if treatment goals not met, if there is a change in medical status, if patient makes no progress towards goals or if patient is discharged from hospital.  The above assessment, treatment plan, treatment alternatives and goals were discussed and mutually agreed upon: by patient  [place] 03/04/2020, 1:18 PM

## 2020-03-04 NOTE — Progress Notes (Signed)
[facility] PHYSICAL MEDICINE & REHABILITATION PROGRESS NOTE   Subjective/Complaints:   Pt specifically says he's having a BM every day- although KUB shows adynamics ileus- will order another KUB in AM and make sure having daily BMs.   Called nephrology about electrolytes since Na 122- down from 129- and held AM [facility] and changed to daily from BID.   Pt said didn't sleep well because nursing coming in all the time-suggested trazodone - is prn- can take if needs it.   Had hiccups x 1 week- even with Thorazine- ordered baclofen 10 mg TID prn for hiccups  Denies N/V- abd pain, any sign of ileus - no Sx's.   ROS:  Pt denies SOB, abd pain, CP, N/V/C/D, and vision changes   Objective:   DG Abd 1 View  Result Date: 03/03/2020 CLINICAL DATA:  Cord compression.  Left upper quadrant pain. EXAM: ABDOMEN - 1 VIEW COMPARISON:  CT 02/14/2020. FINDINGS: Distended loops of small and large bowel noted suggesting adynamic ileus. Recent CT of the abdomen pelvis of 02/14/2020 did reveal a right inguinal hernia with herniation of fat. No free air. Bony lesions present best identified by prior CT. IMPRESSION: Distended loops of small and large bowel noted suggesting adynamic ileus. Recent CT of the abdomen pelvis of 02/14/2020 however did reveal a right inguinal hernia with herniation of fat. Follow-up exams suggested to demonstrate resolution of bowel distention in order to exclude bowel obstruction. Electronically Signed   By: Marcello Moores  Register   On: 03/03/2020 13:56   VAS [place] LOWER EXTREMITY VENOUS (DVT)  Result Date: 03/03/2020  Lower Venous DVTStudy Indications: Immobility.  Performing Technologist: Griffin Basil RCT RDMS  Examination Guidelines: A complete evaluation includes B-mode imaging, spectral Doppler, color Doppler, and power Doppler as needed of all accessible portions of each vessel. Bilateral testing is considered an integral part of a complete examination. Limited examinations for  reoccurring indications may be performed as noted. The reflux portion of the exam is performed with the patient in reverse Trendelenburg.  +---------+---------------+---------+-----------+----------+--------------+ RIGHT    CompressibilityPhasicitySpontaneityPropertiesThrombus Aging +---------+---------------+---------+-----------+----------+--------------+ CFV      Full           Yes      Yes                                 +---------+---------------+---------+-----------+----------+--------------+ SFJ      Full                                                        +---------+---------------+---------+-----------+----------+--------------+ FV Prox  Full                                                        +---------+---------------+---------+-----------+----------+--------------+ FV Mid   Full                                                        +---------+---------------+---------+-----------+----------+--------------+ FV DistalFull                                                        +---------+---------------+---------+-----------+----------+--------------+  PFV      Full                                                        +---------+---------------+---------+-----------+----------+--------------+ POP      Full           Yes      Yes                                 +---------+---------------+---------+-----------+----------+--------------+ PTV      Full                                                        +---------+---------------+---------+-----------+----------+--------------+ PERO     Full                                                        +---------+---------------+---------+-----------+----------+--------------+   +---------+---------------+---------+-----------+----------+--------------+ LEFT     CompressibilityPhasicitySpontaneityPropertiesThrombus Aging  +---------+---------------+---------+-----------+----------+--------------+ CFV      Full           Yes      Yes                                 +---------+---------------+---------+-----------+----------+--------------+ SFJ      Full                                                        +---------+---------------+---------+-----------+----------+--------------+ FV Prox  Full                                                        +---------+---------------+---------+-----------+----------+--------------+ FV Mid   Full                                                        +---------+---------------+---------+-----------+----------+--------------+ FV DistalFull                                                        +---------+---------------+---------+-----------+----------+--------------+ PFV      Full                                                        +---------+---------------+---------+-----------+----------+--------------+  POP      Full           Yes      Yes                                 +---------+---------------+---------+-----------+----------+--------------+ PTV      Full                                                        +---------+---------------+---------+-----------+----------+--------------+ PERO     Full                                                        +---------+---------------+---------+-----------+----------+--------------+     Summary: RIGHT: - There is no evidence of deep vein thrombosis in the lower extremity.  - No cystic structure found in the popliteal fossa.  LEFT: - There is no evidence of deep vein thrombosis in the lower extremity.  - No cystic structure found in the popliteal fossa.  *See table(s) above for measurements and observations. Electronically signed by Monica Martinez MD on 03/03/2020 at 3:26:05 PM.    Final    Recent Labs    03/02/20 0541 03/04/20 0633  WBC 10.3 7.9  HGB 11.4* 10.4*  HCT  32.7* 30.2*  PLT 148* 121*   Recent Labs    03/03/20 0612 03/04/20 0633  NA 123* 122*  K 4.6 4.8  CL 90* 90*  CO2 22 20*  GLUCOSE 200* 148*  BUN 62* 62*  CREATININE 1.49* 1.53*  CALCIUM 11.7* 11.8*    Intake/Output Summary (Last 24 hours) at 03/04/2020 1303 Last data filed at 03/04/2020 0801 Gross per 24 hour  Intake 330 ml  Output 2000 ml  Net -1670 ml     Physical Exam: Vital Signs Blood pressure 134/79, pulse 87, temperature 97.6 F (36.4 C), resp. rate 16, height 5' 3" (1.6 m), weight 62.9 kg, SpO2 97 %.   Constitutional:  laying in bed- appropriate, using tele-interpretor, NAD HENT: conjugate gaze  Cardiovascular: RRR  Pulmonary: CTA B/L- no W/R/R- good air movement; TTP over rib fractures Abdominal: NT, ND, soft, normoactive BS- denies nasuea or any Sx's of ileus Musculoskeletal:     Cervical back: Neck supple. No rigidity.     Comments: UEs 5/5 in deltoid, biceps, triceps, WE, grip and finger abd B/L- pain with biceps testing due to rib fx's LEs- RLE- HF 3-/5, KE 3+/5, DF 4-/5, PF 5-/5 LLE- HF 2.5, KE 3/5, DF 2/5, PF 5-/5  Skin:    General: Skin is warm and dry.     Comments: IV in R wrist- looks good Large purple bruise on sacrum- a tiny puncture mark under it- healing well B/L knee brown squishy bruises Trace L foot edema- not ankle 2 radiation tags- chest and R side  Neurological: Ox3.   Light touch intact in all 4 extremities B/L Psychiatric:   frustrated over poor sleep    Assessment/Plan: 1. 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  Physiatrist is providing close team supervision and 24 hour management of active medical problems listed below.  Physiatrist and rehab team continue to assess barriers to discharge/monitor patient progress toward functional and medical goals  Care  Tool:  Bathing    Body parts bathed by patient: Right arm, Left arm, Chest, Abdomen, Front perineal area (UB only today)         Bathing assist Assist Level: Moderate Assistance - Patient 50 - 74%     Upper Body Dressing/Undressing Upper body dressing   What is the patient wearing?: Pull over shirt    Upper body assist Assist Level: Set up assist    Lower Body Dressing/Undressing Lower body dressing      What is the patient wearing?: Pants     Lower body assist Assist for lower body dressing: Maximal Assistance - Patient 25 - 49%     Toileting Toileting    Toileting assist Assist for toileting: Total Assistance - Patient < 25% (on toilet) Assistive Device Comment: urinal   Transfers Chair/bed transfer  Transfers assist     Chair/bed transfer assist level: 2 Helpers     Locomotion Ambulation   Ambulation assist   Ambulation activity did not occur: Safety/medical concerns          Walk 10 feet activity   Assist  Walk 10 feet activity did not occur: Safety/medical concerns        Walk 50 feet activity   Assist Walk 50 feet with 2 turns activity did not occur: Safety/medical concerns         Walk 150 feet activity   Assist Walk 150 feet activity did not occur: Safety/medical concerns         Walk 10 feet on uneven surface  activity   Assist Walk 10 feet on uneven surfaces activity did not occur: Safety/medical concerns         Wheelchair     Assist Will patient use wheelchair at discharge?: Yes Type of Wheelchair: Manual    Wheelchair assist level: Moderate Assistance - Patient 50 - 74% Max wheelchair distance: 150    Wheelchair 50 feet with 2 turns activity    Assist        Assist Level: Moderate Assistance - Patient 50 - 74%   Wheelchair 150 feet activity     Assist      Assist Level: Moderate Assistance - Patient 50 - 74%   Blood pressure 134/79, pulse 87, temperature 97.6 F (36.4 C), resp.  rate 16, height 5' 3" (1.6 m), weight 62.9 kg, SpO2 97 %.   Medical Problem List and Plan: 1.  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  Antithrombotics: -DVT/anticoagulation:  Pharmaceutical: Lovenox   8/27- at high risk for DVT due to SCI and cancer being treated             --BLE dopplers negative for DVT             -antiplatelet therapy: N/a 3. Pain Management: Continue oxycodone --increase to 10 mg q 4 hrs prn--d/c IV dilaudid.  8/27- pain doing OK per pt  4. Mood: LCSW to follow for evaluation and support.              -  antipsychotic agents: N/a 5. Neuropsych: This patient is capable of making decisions on his own behalf. 6. Skin/Wound Care: Routine pressure relief measures.  7. Fluids/Electrolytes/Nutrition: Strict I/O. Check lytes in am.  8. Acute renal failure: Improving 81/4.92-->62/1.49 9. Hyponatremia: Recurrent hyponatremia  10. New diagnosis T2DM: Hgb A1c-6.5. BS poorly controlled due to steriods as well as Ensure tid between meals. Will change ensure to Ensure Max --continue Lantus 10 units at bedtime with 3 units meal coverage tid as well as SSI for elevated BS.    CBG (last 3)  Recent Labs    03/03/20 2103 03/04/20 0556 03/04/20 1131  GLUCAP 168* 165* 188*    8/27- BGs adequate levels- con't regimen for now 11. Constipation: Continue miralax am/change senna to lunch. KUB done showing ileus?  Wife/patient reports lot of gas and that he has been having BM daily. Pt says as well, BM daily.   8/27- adynamic ileus yesterday- will make sure pt has BM and recheck KUB in AM 12. Mild BPH: will monitor voiding with PVR checks.  13. 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 Pathologic Fx T6 and T9:  Treated with XRT 10/10 and high dose steroids.  15. L foot drop- get prevalon boot on L at night   16. Hyponatremia  8/27- Na 122 this AM- will call Nephrology, esp because K+ 4.8 and was on Lokelma BID- changed to daily and held dose this AM.  17. Hiccups- for 1 week  8/27- on Thorazine- will add baclofen 10 mg TID prn for hiccups    LOS: 1 days A FACE TO FACE EVALUATION WAS PERFORMED    03/04/2020, 1:03 PM

## 2020-03-04 NOTE — Progress Notes (Addendum)
[facility] NEPHROLOGY PROGRESS NOTE  Assessment/ Plan: Pt is a 54 y.o. yo male with history of hypertension initially admitted with generalized weakness, body pain back pain found to have newly diagnosis of multiple myeloma with a lytic bone lesions, AKI (cr 4.9) and hypercalcemia (calcium >15).  Seen by our nephrology team and signed off on 8/15 when creatinine level improved to 2.69.  Now undergoing inpatient rehab and we are reconsulted for worsening hyponatremia.  #Acute kidney injury due to multiple myeloma and hypercalcemia causing prerenal status.  Serum creatinine level has significantly improved to 1.53 today. Monitor urine output, strict ins and out, daily lab.  #Hyponatremia likely pseudohyponatremia due to paraproteinemia however worsening sodium level can be explained by dehydration.  He has reduced oral intake and looks dry on physical exam. I will repeat serum free light chain, check serum osmolality, urine osmolality, urine lites.  TSH level was acceptable. Start NS for gentle hydration. He is asymptomatic from hyponatremia.  Monitor lab.  #Newly diagnosed IgG kappa multiple myeloma with skeletal involvement and cord compression: Seen by oncology.  Treated with radiation therapy for cord compression.  On a steroid.  Plan for chemotherapy as outpatient per oncology.  #Hypercalcemia due to MM with lytic bone lesion: Received pamidronate and calcitonin in the past.  Starting IV fluid as above.  Order a dose of zoledronic acid.  #Hypertension: Blood pressure acceptable.  Continue amlodipine, carvedilol.  #Hyperkalemia: On Lokelma.  Potassium level acceptable.  Subjective: Seen and examined in rehab.  Spanish translator with the patient.  Reports decreased oral intake.  Denies nausea vomiting chest pain shortness of breath.  Objective Vital signs in last 24 hours: Vitals:   03/03/20 2012 03/04/20 0445 03/04/20 0500 03/04/20 0755  BP: 122/81 134/79    Pulse: 72  87    Resp:      Temp: 97.9 F (36.6 C) 97.6 F (36.4 C)    SpO2: 97% 97%    Weight:   62 kg 62.9 kg  Height:       Weight change:   Intake/Output Summary (Last 24 hours) at 03/04/2020 1413 Last data filed at 03/04/2020 1330 Gross per 24 hour  Intake 570 ml  Output 2000 ml  Net -1430 ml       Labs: Basic Metabolic Panel: Recent Labs  Lab 03/02/20 0541 03/03/20 0612 03/04/20 0633  NA 124* 123* 122*  K 5.3* 4.6 4.8  CL 92* 90* 90*  CO2 23 22 20*  GLUCOSE 241* 200* 148*  BUN 65* 62* 62*  CREATININE 1.56* 1.49* 1.53*  CALCIUM 11.5* 11.7* 11.8*   Liver Function Tests: Recent Labs  Lab 02/27/20 0639 03/04/20 0633  AST 30 35  ALT 47* 28  ALKPHOS 130* 115  BILITOT 0.5 0.7  PROT 6.8 6.7  ALBUMIN 3.0* 2.7*   No results for input(s): LIPASE, AMYLASE in the last 168 hours. No results for input(s): AMMONIA in the last 168 hours. CBC: Recent Labs  Lab 02/28/20 0518 02/28/20 0518 02/29/20 0927 02/29/20 0927 03/01/20 0612 03/02/20 0541 03/04/20 0633  WBC 7.3   < > 9.5   < > 10.7* 10.3 7.9  NEUTROABS 6.3   < > 8.4*   < > 9.2* 8.9* 6.7  HGB 10.2*   < > 11.2*   < > 11.4* 11.4* 10.4*  HCT 29.8*   < > 32.5*   < > 33.0* 32.7* 30.2*  MCV 86.4  --  86.0  --  86.4 85.4 83.7  PLT 170   < >  156   < > 168 148* 121*   < > = values in this interval not displayed.   Cardiac Enzymes: No results for input(s): CKTOTAL, CKMB, CKMBINDEX, TROPONINI in the last 168 hours. CBG: Recent Labs  Lab 03/03/20 1239 03/03/20 1749 03/03/20 2103 03/04/20 0556 03/04/20 1131  GLUCAP 323* 254* 168* 165* 188*    Iron Studies: No results for input(s): IRON, TIBC, TRANSFERRIN, FERRITIN in the last 72 hours. Studies/Results: DG Abd 1 View  Result Date: 03/03/2020 CLINICAL DATA:  Cord compression.  Left upper quadrant pain. EXAM: ABDOMEN - 1 VIEW COMPARISON:  CT 02/14/2020. FINDINGS: Distended loops of small and large bowel noted suggesting adynamic ileus. Recent CT of the abdomen pelvis  of 02/14/2020 did reveal a right inguinal hernia with herniation of fat. No free air. Bony lesions present best identified by prior CT. IMPRESSION: Distended loops of small and large bowel noted suggesting adynamic ileus. Recent CT of the abdomen pelvis of 02/14/2020 however did reveal a right inguinal hernia with herniation of fat. Follow-up exams suggested to demonstrate resolution of bowel distention in order to exclude bowel obstruction. Electronically Signed   By: Marcello Moores  Register   On: 03/03/2020 13:56   VAS [place] LOWER EXTREMITY VENOUS (DVT)  Result Date: 03/03/2020  Lower Venous DVTStudy Indications: Immobility.  Performing Technologist: Griffin Basil RCT RDMS  Examination Guidelines: A complete evaluation includes B-mode imaging, spectral Doppler, color Doppler, and power Doppler as needed of all accessible portions of each vessel. Bilateral testing is considered an integral part of a complete examination. Limited examinations for reoccurring indications may be performed as noted. The reflux portion of the exam is performed with the patient in reverse Trendelenburg.  +---------+---------------+---------+-----------+----------+--------------+ RIGHT    CompressibilityPhasicitySpontaneityPropertiesThrombus Aging +---------+---------------+---------+-----------+----------+--------------+ CFV      Full           Yes      Yes                                 +---------+---------------+---------+-----------+----------+--------------+ SFJ      Full                                                        +---------+---------------+---------+-----------+----------+--------------+ FV Prox  Full                                                        +---------+---------------+---------+-----------+----------+--------------+ FV Mid   Full                                                        +---------+---------------+---------+-----------+----------+--------------+ FV DistalFull                                                         +---------+---------------+---------+-----------+----------+--------------+ PFV  Full                                                        +---------+---------------+---------+-----------+----------+--------------+ POP      Full           Yes      Yes                                 +---------+---------------+---------+-----------+----------+--------------+ PTV      Full                                                        +---------+---------------+---------+-----------+----------+--------------+ PERO     Full                                                        +---------+---------------+---------+-----------+----------+--------------+   +---------+---------------+---------+-----------+----------+--------------+ LEFT     CompressibilityPhasicitySpontaneityPropertiesThrombus Aging +---------+---------------+---------+-----------+----------+--------------+ CFV      Full           Yes      Yes                                 +---------+---------------+---------+-----------+----------+--------------+ SFJ      Full                                                        +---------+---------------+---------+-----------+----------+--------------+ FV Prox  Full                                                        +---------+---------------+---------+-----------+----------+--------------+ FV Mid   Full                                                        +---------+---------------+---------+-----------+----------+--------------+ FV DistalFull                                                        +---------+---------------+---------+-----------+----------+--------------+ PFV      Full                                                        +---------+---------------+---------+-----------+----------+--------------+  POP      Full           Yes      Yes                                  +---------+---------------+---------+-----------+----------+--------------+ PTV      Full                                                        +---------+---------------+---------+-----------+----------+--------------+ PERO     Full                                                        +---------+---------------+---------+-----------+----------+--------------+     Summary: RIGHT: - There is no evidence of deep vein thrombosis in the lower extremity.  - No cystic structure found in the popliteal fossa.  LEFT: - There is no evidence of deep vein thrombosis in the lower extremity.  - No cystic structure found in the popliteal fossa.  *See table(s) above for measurements and observations. Electronically signed by Monica Martinez MD on 03/03/2020 at 3:26:05 PM.    Final     Medications: Infusions:   Scheduled Medications: . (feeding supplement) PROSource Plus  30 mL Oral BID BM  . amLODipine  10 mg Oral Daily  . atorvastatin  20 mg Oral Daily  . carvedilol  3.125 mg Oral BID WC  . Chlorhexidine Gluconate Cloth  6 each Topical Q0600  . dexamethasone (DECADRON) injection  4 mg Intravenous Q8H  . enoxaparin (LOVENOX) injection  40 mg Subcutaneous Q24H  . insulin aspart  0-9 Units Subcutaneous TID WC  . insulin aspart  3 Units Subcutaneous TID WC  . insulin glargine  10 Units Subcutaneous QHS  . lidocaine  1 patch Transdermal Q24H  . pantoprazole  40 mg Oral Daily  . pneumococcal 23 valent vaccine  0.5 mL Intramuscular Tomorrow-1000  . polyethylene glycol  17 g Oral Daily  . Ensure Max Protein  11 oz Oral BID BM  . senna-docusate  2 tablet Oral Q lunch  . sodium chloride flush  10-40 mL Intracatheter Q12H  . [START ON 03/05/2020] sodium zirconium cyclosilicate  10 g Oral Daily    have reviewed scheduled and prn medications.  Physical Exam: General:NAD, comfortable Heart:RRR, s1s2 nl Lungs:clear b/l, no crackle Abdomen:soft, Non-tender, non-distended Extremities:No  edema   Ranell Skibinski Tanna Furry 03/04/2020,2:13 PM  LOS: 1 day  Pager: [phone]

## 2020-03-04 NOTE — Progress Notes (Signed)
Inpatient Rehabilitation  Patient information reviewed and entered into eRehab system by Iliani Vejar M. Jaimi Belle, M.A., CCC/SLP, PPS Coordinator.  Information including medical coding, functional ability and quality indicators will be reviewed and updated through discharge.    

## 2020-03-04 NOTE — Evaluation (Signed)
Physical Therapy Assessment and Plan  Patient Details  Name: Trevor Shelton MRN: [id number] Date of Birth: Nov 15, 1965  PT Diagnosis: Abnormal posture, Difficulty walking, Dizziness and giddiness, Impaired sensation, Muscle weakness, Paraplegia and Pain in L flank with rib fractures Rehab Potential: Fair ELOS: 2-3weeks   Today's Date: 03/04/2020 PT Individual Time: 0800-0900 PT Individual Time Calculation (min): 60 min    Evaluation completed (see details above and below) with education on PT POC and goals and individual treatment initiated with focus on  pt received in bed and agreeable to therapy, interpreter present throughout session. Pt reported 5/10 pain in L flank, nursing present at start of session for medication. Pt reported same level at end of session. Pt directed in rolling to L and R max A with use of bed rails for donning pants. Pt directed in supine>sit with log rolling technique to pt's R max A with (+) bed rails, dizziness reported at EOB initially but improved with rest break. Pt directed in lateral scooting R x2 mod A, attempted lateral scoot to [place] however pt unable to complete this safely and required to return to sitting EOB, max A x1, slide board attempted to [facility], max A x2 to complete. Pt directed in [place] mobility for 150' mod A and setup for parts and initially max A for turns however improved with VC and visual demo. Pt directed in steady transfer to new WC once in gym max A x2 for safety to improve pt's mobility in [facility] with improved size of chair. Once in new WC pt able to self propel with improved fluidity of propulsion. Pt returned to room, left with interpreter present, alarm belt in  Place in [facility], All needs in reach and in good condition. Call light in hand.    Hospital Problem: Principal Problem:   Incomplete paraplegia [facility]) Active Problems:   Cord compression [facility])   Past Medical History:  Past Medical History:  Diagnosis Date  . AKI (acute kidney injury) ([place]) 02/2020   . Hypercalcemia 02/2020  . Hypercholesteremia   . Hypertension    Past Surgical History:  Past Surgical History:  Procedure Laterality Date  . HERNIA REPAIR    . IR FLUORO GUIDE CV LINE RIGHT  02/17/2020  . IR REMOVAL TUN CV CATH W/O FL  03/01/2020  . IR US GUIDE VASC ACCESS RIGHT  02/17/2020    Assessment & Plan Clinical Impression: Patient is a 54 y.o. year old male with recent admission to the hospital in relatively good health except for a fall 4 feed off a ladder  01/26/20 with onset of back, left rib and progressive BLE weakness. He was evaluated by ortho on outpatient basis with treatment but continued to worsen with reports of burning pain in the back as well as decrease in UOP and constipation. 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 Work up concerning for MM as he was also found to have hypercalcemia with ARF and significant hyponatremia. Dr LAugustin Coupefelt recommended IVF for aggressive hydration as well as calcitonin,  Zometa and pamidronate for management of hypercalcemia. hyponatremia felt to be due to paraproteinemia. Patient transferred to CIR on 03/03/2020 .   Patient currently requires  total with mobility secondary to muscle weakness, decreased cardiorespiratoy endurance, unbalanced muscle activation and motor apraxia and decreased sitting balance, decreased standing balance, decreased postural control and decreased balance strategies.  Prior to hospitalization, patient was independent  with mobility and lived with Spouse in a House home.  Home access is 5Stairs to enter.  Patient will benefit from skilled PT intervention to maximize safe functional mobility, minimize fall risk  and decrease caregiver burden for planned discharge home with 24 hour assist.  Anticipate patient will benefit from follow up [facility] at discharge.  PT - End of Session Activity Tolerance: Tolerates 30+ min activity with multiple rests Endurance Deficit: Yes PT Assessment Rehab Potential (ACUTE/IP ONLY): Fair PT Barriers to Discharge: [place] home environment;Home environment access/layout;Medication compliance;Pending chemo/radiation PT Barriers to Discharge Comments: stair enterance to home, possibly limited WC access into bathroom PT Patient demonstrates impairments in the following area(s): Balance;Skin Integrity;Pain;Safety;Endurance;Motor;Sensory PT Transfers Functional Problem(s): Bed Mobility;Bed to Chair PT Locomotion Functional Problem(s): Wheelchair Mobility;Ambulation PT Plan PT Intensity: Minimum of 1-2 x/day ,45 to 90 minutes PT Frequency: 5 out of 7 days PT Duration Estimated Length of Stay: 2-3weeks PT Treatment/Interventions: Ambulation/gait training;Discharge planning;DME/adaptive equipment instruction;Functional mobility training;Pain management;Psychosocial support;Splinting/orthotics;Therapeutic Activities;UE/LE Strength taining/ROM;Visual/perceptual remediation/compensation;Wheelchair propulsion/positioning;UE/LE Coordination activities;Therapeutic Exercise;Stair training;Skin care/wound management;Patient/family education;Neuromuscular re-education;Disease management/prevention;Community reintegration;Balance/vestibular training PT Transfers Anticipated Outcome(s): min A PT Locomotion Anticipated Outcome(s): supervision with WC mobility PT Recommendation Recommendations for Other Services: Therapeutic Recreation consult;Neuropsych consult Therapeutic Recreation Interventions: Stress management Follow Up Recommendations: Home health PT Patient destination: Home Equipment Recommended: To be determined   PT Evaluation Precautions/Restrictions Precautions Precautions:  Back;Fall (per chart review, no specific back precautions however encouraged per pt comfort) Precaution Comments: Significant BLE weakness and ataxia-like movements (LLE>RLE); back and rib precautions for comfort due to fxs Restrictions Weight Bearing Restrictions: No General Chart Reviewed: Yes Family/Caregiver Present: No Vital Signs  Pain Pain Assessment Pain Scale: 0-10 Pain Score: 5  Pain Type: Acute pain Pain Location: Flank Pain Orientation: Left Pain Descriptors / Indicators: Discomfort;Aching Pain Onset: On-going Pain Intervention(s): Medication (See eMAR);Distraction;Rest;Repositioned Multiple Pain Sites: No Home Living/Prior Functioning Home Living Available Help at Discharge: Family;Available 24 hours/day Type of Home: House Home Access: Stairs to enter [employer] of Steps: 5 Entrance Stairs-Rails: Can reach both;Right;Left Home Layout: One level Bathroom Accessibility: Yes  Lives With: Spouse Prior Function Level of Independence: Independent with homemaking with ambulation;Independent with basic ADLs;Independent with transfers  Able to Take Stairs?: Yes Driving: Yes Comments: Working as a Curator. Drives. Wife cleans houses but able to be home with him as needed. Reports increasing difficulty walking at home, heaviness and weakness in legs but worse in hospital. Vision/Perception  Perception Perception: Within Functional Limits  Cognition Overall Cognitive Status: Within Functional Limits for tasks assessed Arousal/Alertness: Awake/alert Orientation Level: Oriented X4 Safety/Judgment: Appears intact Sensation Sensation Light Touch: Impaired by gross assessment (pt able to repond correctly to 5/5 R and L LE light touch with EC however pt reports his L foot "does not feel the same" as right) Coordination Gross Motor Movements are Fluid and Coordinated: No Fine Motor Movements are Fluid and Coordinated: No Motor  Motor Motor: Paraplegia;Ataxia    Trunk/Postural Assessment  Cervical Assessment Cervical Assessment: Exceptions to [facility] Thoracic Assessment Thoracic Assessment: Exceptions to [facility] Lumbar Assessment Lumbar Assessment: Exceptions to [facility] Postural Control Postural Control: Deficits on evaluation (forward head carriage, posterior pelvic tilt, kyphotic)  Balance Balance Balance Assessed: Yes Dynamic Sitting Balance Sitting balance - Comments: static sitting CGA; dynamic sitting mod  A; unable to assess any standing balance 2/2 safety concerns Extremity Assessment      RLE Assessment RLE Assessment: Exceptions to [facility] Passive Range of Motion (PROM) Comments: WFL Active Range of Motion (AROM) Comments: WFL General Strength Comments: H hip flexion 2+/5; knee extension 3+/5, knee flexion 3/5; ankle DF 2+/5, PF 3/5 LLE Assessment LLE Assessment: Exceptions to [facility] Passive Range of Motion (PROM) Comments: WFL Active Range of Motion (AROM) Comments: WFL General Strength Comments: L hip flexion 2/5; knee extension 3+/5, knee flexion 3/5; ankle DF 2/5, PF 2+/5  Care Tool Care Tool Bed Mobility Roll left and right activity   Roll left and right assist level: Maximal Assistance - Patient 25 - 49%    Sit to lying activity   Sit to lying assist level: Maximal Assistance - Patient 25 - 49%    Lying to sitting edge of bed activity   Lying to sitting edge of bed assist level: Maximal Assistance - Patient 25 - 49%     Care Tool Transfers Sit to stand transfer Sit to stand activity did not occur: Safety/medical concerns      Chair/bed transfer   Chair/bed transfer assist level: 2 Armed forces training and education officer transfer activity did not occur: Safety/medical Personal assistant transfer activity did not occur: Safety/medical concerns        Care Tool Locomotion Ambulation Ambulation activity did not occur: Safety/medical concerns        Walk 10 feet activity Walk 10 feet activity did not occur:  Safety/medical concerns       Walk 50 feet with 2 turns activity Walk 50 feet with 2 turns activity did not occur: Safety/medical concerns      Walk 150 feet activity Walk 150 feet activity did not occur: Safety/medical concerns      Walk 10 feet on uneven surfaces activity Walk 10 feet on uneven surfaces activity did not occur: Safety/medical concerns      Stairs Stair activity did not occur: Safety/medical concerns        Walk up/down 1 step activity Walk up/down 1 step or curb (drop down) activity did not occur: Safety/medical concerns     Walk up/down 4 steps activity did not occuR: Safety/medical concerns  Walk up/down 4 steps activity      Walk up/down 12 steps activity Walk up/down 12 steps activity did not occur: Safety/medical concerns      Pick up small objects from floor Pick up small object from the floor (from standing position) activity did not occur: Safety/medical concerns      Wheelchair Will patient use wheelchair at discharge?: Yes Type of Wheelchair: Manual   Wheelchair assist level: Moderate Assistance - Patient 50 - 74% Max wheelchair distance: 150  Wheel 50 feet with 2 turns activity   Assist Level: Moderate Assistance - Patient 50 - 74%  Wheel 150 feet activity   Assist Level: Moderate Assistance - Patient 50 - 74%    Refer to Care Plan for Long Term Goals  SHORT TERM GOAL WEEK 1 PT Short Term Goal 1 (Week 1): pt to demonstrate dynamic sitting balance at CGA for 86mns PT Short Term Goal 2 (Week 1): pt to demonstrate min A x1 for rolling consistently PT Short Term Goal 3 (Week 1): pt to demonstrate [facility] with LRAD PT Short Term Goal 4 (Week 1): pt to self propel WC for 100' CGA with setup for parts  Recommendations for other services: Therapeutic Recreation  Stress management  Skilled Therapeutic Intervention Mobility Bed Mobility Bed Mobility: Rolling Left;Rolling Right;Supine to Sit;Sitting - Scoot to Edge of  Bed Rolling Right: Maximal Assistance - Patient 25-49% Rolling Left: Maximal Assistance - Patient 25-49% Supine to Sit: Maximal Assistance - Patient - Patient 25-49% Sitting - Scoot to Edge of Bed: Dependent - Patient equal 0% Transfers Transfers: Lateral/Scoot Transfers Lateral/Scoot Transfers: 2 Press photographer (Assistive device): Other (Comment) (slide board) Transfer via Lift Equipment: Animal nutritionist: No Gait Gait: No Stairs / Additional Locomotion Stairs: No Architect: Yes Wheelchair Assistance: Moderate Assistance - Patient 50 - 74% Wheelchair Propulsion: Both upper extremities Wheelchair Parts Management: Needs assistance;Supervision/cueing Distance: 150'   Discharge Criteria: Patient will be discharged from PT if patient refuses treatment 3 consecutive times without medical reason, if treatment goals not met, if there is a change in medical status, if patient makes no progress towards goals or if patient is discharged from hospital.  The above assessment, treatment plan, treatment alternatives and goals were discussed and mutually agreed upon: by patient  [name] 03/04/2020, 12:25 PM

## 2020-03-04 NOTE — Progress Notes (Signed)
Physical Therapy Session Note  Patient Details  Name: Trevor Shelton MRN: [id number] Date of Birth: 06/09/1966  Today's Date: 03/04/2020 PT Individual Time: 1300-1410 PT Individual Time Calculation (min): 70 min   Short Term Goals: Week 1:  PT Short Term Goal 1 (Week 1): pt to demonstrate dynamic sitting balance at CGA for 41mins PT Short Term Goal 2 (Week 1): pt to demonstrate min A x1 for rolling consistently PT Short Term Goal 3 (Week 1): pt to demonstrate transfers to/from [facility] mod A with LRAD PT Short Term Goal 4 (Week 1): pt to self propel WC for 100' CGA with setup for parts  Skilled Therapeutic Interventions/Progress Updates:  Pt received seated in bed, agreeable to PT session. Pt reports ongoing pain in ribs at fx site, not rated and declines intervention. Pt's wife present and provided pt's own clothing, pt wishing to change into his clothes. Semi-reclined in bed to sitting EOB via logroll with mod A for trunk control and some LE management. Assisted pt with doffing scrub top and donning his shirt while seated EOB. Pt exhibits difficulty maintaining static sitting balance EOB without UE support and has near LOB posteriorly, requires max A to recover. Sit to stand with assist x 2 to stedy. Stedy transfer to w/c. Assisted pt with doffing hospital scrub pants and donning his shorts while standing in stedy. Assisted pt with donning socks and tennis shoes while seated in w/c. Dependent transport via w/c to/from therapy gym for time conservation. Sit to stand in standing frame. Pt able to tolerate standing in frame x 10 min with full support from sling. While in standing focus on knee flexion/extension, glute activation, trunk extension, and decreased UE reliance and shoulder elevation. Pt reports feeling significantly fatigued following standing frame. Dependent transfer back to room due to patient fatigue. Pt requesting to remain seated in w/c at end of session. Pt left seated in w/c in room with needs  in reach, quick release belt and chair alarm in place, wife present. Interpreter present during therapy session.  Therapy Documentation Precautions:  Precautions Precautions: Back, Fall (per chart review, no specific back precautions however encouraged per pt comfort) Precaution Comments: Significant BLE weakness and ataxia-like movements (LLE>RLE); back and rib precautions for comfort due to fxs Restrictions Weight Bearing Restrictions: No     Therapy/Group: Individual Therapy   Excell Seltzer, PT, DPT  03/04/2020, 2:11 PM

## 2020-03-05 ENCOUNTER — Inpatient Hospital Stay (HOSPITAL_COMMUNITY): Payer: Self-pay

## 2020-03-05 LAB — BASIC METABOLIC PANEL
Anion gap: 8 (ref 5–15)
BUN: 53 mg/dL — ABNORMAL HIGH (ref 6–20)
CO2: 23 mmol/L (ref 22–32)
Calcium: 11.5 mg/dL — ABNORMAL HIGH (ref 8.9–10.3)
Chloride: 93 mmol/L — ABNORMAL LOW (ref 98–111)
Creatinine, Ser: 1.3 mg/dL — ABNORMAL HIGH (ref 0.61–1.24)
GFR calc Af Amer: >60 mL/min (ref >60)
GFR calc non Af Amer: >60 mL/min (ref >60)
Glucose, Bld: 203 mg/dL — ABNORMAL HIGH (ref 70–99)
Potassium: 4.5 mmol/L (ref 3.5–5.1)
Sodium: 124 mmol/L — ABNORMAL LOW (ref 135–145)

## 2020-03-05 LAB — GLUCOSE, CAPILLARY
Glucose-Capillary: 178 mg/dL — ABNORMAL HIGH (ref 70–99)
Glucose-Capillary: 270 mg/dL — ABNORMAL HIGH (ref 70–99)
Glucose-Capillary: 257 mg/dL — ABNORMAL HIGH (ref 70–99)
Glucose-Capillary: 233 mg/dL — ABNORMAL HIGH (ref 70–99)

## 2020-03-05 MED ORDER — INSULIN GLARGINE 100 UNIT/ML ~~LOC~~ SOLN
11.0000 [IU] | Freq: Every day | SUBCUTANEOUS | Status: DC
  Administered 2020-03-05: 11 [IU] via SUBCUTANEOUS
  Filled 2020-03-05 (×2): qty 0.11

## 2020-03-05 NOTE — Progress Notes (Signed)
[facility] MEDICINE & REHABILITATION PROGRESS NOTE   Subjective/Complaints: Continues to have hiccups despite baclofen. Has some pain along left ribs, has lidocaine patch KUB today, result pending CBGs elevated  ROS:  Pt denies SOB, abd pain, CP, N/V/C/D, and vision changes   Objective:   DG Abd 1 View  Result Date: 03/03/2020 CLINICAL DATA:  Cord compression.  Left upper quadrant pain. EXAM: ABDOMEN - 1 VIEW COMPARISON:  CT 02/14/2020. FINDINGS: Distended loops of small and large bowel noted suggesting adynamic ileus. Recent CT of the abdomen pelvis of 02/14/2020 did reveal a right inguinal hernia with herniation of fat. No free air. Bony lesions present best identified by prior CT. IMPRESSION: Distended loops of small and large bowel noted suggesting adynamic ileus. Recent CT of the abdomen pelvis of 02/14/2020 however did reveal a right inguinal hernia with herniation of fat. Follow-up exams suggested to demonstrate resolution of bowel distention in order to exclude bowel obstruction. Electronically Signed   By: Marcello Moores  Register   On: 03/03/2020 13:56   VAS [place] LOWER EXTREMITY VENOUS (DVT)  Result Date: 03/03/2020  Lower Venous DVTStudy Indications: Immobility.  Performing Technologist: Griffin Basil RCT RDMS  Examination Guidelines: A complete evaluation includes B-mode imaging, spectral Doppler, color Doppler, and power Doppler as needed of all accessible portions of each vessel. Bilateral testing is considered an integral part of a complete examination. Limited examinations for reoccurring indications may be performed as noted. The reflux portion of the exam is performed with the patient in reverse Trendelenburg.  +---------+---------------+---------+-----------+----------+--------------+ RIGHT    CompressibilityPhasicitySpontaneityPropertiesThrombus Aging +---------+---------------+---------+-----------+----------+--------------+ CFV      Full           Yes      Yes                                  +---------+---------------+---------+-----------+----------+--------------+ SFJ      Full                                                        +---------+---------------+---------+-----------+----------+--------------+ FV Prox  Full                                                        +---------+---------------+---------+-----------+----------+--------------+ FV Mid   Full                                                        +---------+---------------+---------+-----------+----------+--------------+ FV DistalFull                                                        +---------+---------------+---------+-----------+----------+--------------+ PFV      Full                                                        +---------+---------------+---------+-----------+----------+--------------+  POP      Full           Yes      Yes                                 +---------+---------------+---------+-----------+----------+--------------+ PTV      Full                                                        +---------+---------------+---------+-----------+----------+--------------+ PERO     Full                                                        +---------+---------------+---------+-----------+----------+--------------+   +---------+---------------+---------+-----------+----------+--------------+ LEFT     CompressibilityPhasicitySpontaneityPropertiesThrombus Aging +---------+---------------+---------+-----------+----------+--------------+ CFV      Full           Yes      Yes                                 +---------+---------------+---------+-----------+----------+--------------+ SFJ      Full                                                        +---------+---------------+---------+-----------+----------+--------------+ FV Prox  Full                                                         +---------+---------------+---------+-----------+----------+--------------+ FV Mid   Full                                                        +---------+---------------+---------+-----------+----------+--------------+ FV DistalFull                                                        +---------+---------------+---------+-----------+----------+--------------+ PFV      Full                                                        +---------+---------------+---------+-----------+----------+--------------+ POP      Full           Yes      Yes                                 +---------+---------------+---------+-----------+----------+--------------+  PTV      Full                                                        +---------+---------------+---------+-----------+----------+--------------+ PERO     Full                                                        +---------+---------------+---------+-----------+----------+--------------+     Summary: RIGHT: - There is no evidence of deep vein thrombosis in the lower extremity.  - No cystic structure found in the popliteal fossa.  LEFT: - There is no evidence of deep vein thrombosis in the lower extremity.  - No cystic structure found in the popliteal fossa.  *See table(s) above for measurements and observations. Electronically signed by Monica Martinez MD on 03/03/2020 at 3:26:05 PM.    Final    Recent Labs    03/04/20 0633  WBC 7.9  HGB 10.4*  HCT 30.2*  PLT 121*   Recent Labs    03/04/20 0633 03/05/20 0510  NA 122* 124*  K 4.8 4.5  CL 90* 93*  CO2 20* 23  GLUCOSE 148* 203*  BUN 62* 53*  CREATININE 1.53* 1.30*  CALCIUM 11.8* 11.5*    Intake/Output Summary (Last 24 hours) at 03/05/2020 1107 Last data filed at 03/05/2020 0900 Gross per 24 hour  Intake 487 ml  Output 3175 ml  Net -2688 ml     Physical Exam: Vital Signs Blood pressure 136/80, pulse 78, temperature 98.7 F (37.1 C), resp. rate 18,  height $Remov'5\' 3"'WJdBFO$  (1.6 m), weight 62.9 kg, SpO2 98 %. General: Alert and oriented x 3, No apparent distress HEENT: Head is normocephalic, atraumatic, PERRLA, EOMI, sclera anicteric, oral mucosa pink and moist, dentition intact, ext ear canals clear,  Neck: Supple without JVD or lymphadenopathy Heart: Reg rate and rhythm. No murmurs rubs or gallops Chest: CTA bilaterally without wheezes, rales, or rhonchi; no distress Abdomen: Soft, non-tender, non-distended, bowel sounds positive. Musculoskeletal:     Cervical back: Neck supple. No rigidity.     Comments: UEs 5/5 in deltoid, biceps, triceps, WE, grip and finger abd B/L- pain with biceps testing due to rib fx's LEs- RLE- HF 3-/5, KE 3+/5, DF 4-/5, PF 5-/5 LLE- HF 2.5, KE 3/5, DF 2/5, PF 5-/5  Skin:    General: Skin is warm and dry.     Comments: IV in R wrist- looks good Large purple bruise on sacrum- a tiny puncture mark under it- healing well B/L knee brown squishy bruises Trace L foot edema- not ankle 2 radiation tags- chest and R side  Neurological: Ox3.   Light touch intact in all 4 extremities B/L Psychiatric:   frustrated over poor sleep   Assessment/Plan: 1. 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  Physiatrist is providing close team supervision and 24 hour management of active medical problems listed below.  Physiatrist and rehab team continue to assess barriers to discharge/monitor patient progress toward functional and medical goals  Care Tool:  Bathing  Body parts bathed by patient: Right arm, Left arm, Chest, Abdomen, Front perineal area (UB only today)         Bathing assist Assist Level: Moderate Assistance - Patient 50 - 74%     Upper Body Dressing/Undressing Upper body dressing   What is the patient wearing?: Pull over shirt    Upper body  assist Assist Level: Moderate Assistance - Patient 50 - 74%    Lower Body Dressing/Undressing Lower body dressing      What is the patient wearing?: Pants     Lower body assist Assist for lower body dressing: Maximal Assistance - Patient 25 - 49%     Toileting Toileting    Toileting assist Assist for toileting: Contact Guard/Touching assist Assistive Device Comment: urinal   Transfers Chair/bed transfer  Transfers assist     Chair/bed transfer assist level: Dependent - mechanical lift (stedy)     Locomotion Ambulation   Ambulation assist   Ambulation activity did not occur: Safety/medical concerns          Walk 10 feet activity   Assist  Walk 10 feet activity did not occur: Safety/medical concerns        Walk 50 feet activity   Assist Walk 50 feet with 2 turns activity did not occur: Safety/medical concerns         Walk 150 feet activity   Assist Walk 150 feet activity did not occur: Safety/medical concerns         Walk 10 feet on uneven surface  activity   Assist Walk 10 feet on uneven surfaces activity did not occur: Safety/medical concerns         Wheelchair     Assist Will patient use wheelchair at discharge?: Yes Type of Wheelchair: Manual    Wheelchair assist level: Moderate Assistance - Patient 50 - 74% Max wheelchair distance: 150    Wheelchair 50 feet with 2 turns activity    Assist        Assist Level: Moderate Assistance - Patient 50 - 74%   Wheelchair 150 feet activity     Assist      Assist Level: Moderate Assistance - Patient 50 - 74%   Blood pressure 136/80, pulse 78, temperature 98.7 F (37.1 C), resp. rate 18, height $RemoveBe'5\' 3"'wqJZTswSj$  (1.6 m), weight 62.9 kg, SpO2 98 %.   Medical Problem List and Plan: 1.  T6 (likely) incomplete ASIA C paraplegia secondary to multiple myeloma and cord compression             -patient may shower             -ELOS/Goals: 3 weeks- goals mod I to supervision ,  possibly at w/c level  -Continue CIR 2.  Antithrombotics: -DVT/anticoagulation:  Pharmaceutical: Lovenox   8/27- at high risk for DVT due to SCI and cancer being treated             --BLE dopplers negative for DVT             -antiplatelet therapy: N/a 3. Pain Management: Continue oxycodone --increase to 10 mg q 4 hrs prn--d/c IV dilaudid.  8/28: pain is stable 4. Mood: LCSW to follow for evaluation and support.              -antipsychotic agents: N/a 5. Neuropsych: This patient is capable of making decisions on his own behalf. 6. Skin/Wound Care: Routine pressure relief measures.  7. Fluids/Electrolytes/Nutrition: Strict I/O. Check lytes in am.  8. Acute renal  failure: Improving 81/4.92-->62/1.49 9. Hyponatremia: Recurrent hyponatremia. Improving to 124 on 8/28.  10. New diagnosis T2DM: Hgb A1c-6.5. BS poorly controlled due to steriods as well as Ensure tid between meals. Will change ensure to Ensure Max --continue Lantus 10 units at bedtime with 3 units meal coverage tid as well as SSI for elevated BS.    CBG (last 3)  Recent Labs    03/04/20 1642 03/04/20 2045 03/05/20 0615  GLUCAP 267* 309* 178*    8/28: Increase Lantus to 11U. Provided dietary counseling. Ordered dietary consult.  11. Constipation: Continue miralax am/change senna to lunch. KUB done showing ileus?  Wife/patient reports lot of gas and that he has been having BM daily. Pt says as well, BM daily.   8/27- adynamic ileus yesterday- will make sure pt has BM and recheck KUB in AM 12. Mild BPH: will monitor voiding with PVR checks.  13. 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 Pathologic Fx T6 and T9:  Treated with XRT 10/10 and high dose steroids.  15. L foot drop- get prevalon boot on L at night  16. Hyponatremia  8/27- Na 122 this AM- will call Nephrology, esp because K+ 4.8 and was on Lokelma BID- changed to daily and held dose this AM.  17. Hiccups- for 1 week  8/27- on Thorazine- will add baclofen 10  mg TID prn for hiccups    LOS: 2 days A FACE TO FACE EVALUATION WAS PERFORMED  Jameya Pontiff P Latiya Navia 03/05/2020, 11:07 AM

## 2020-03-05 NOTE — Progress Notes (Signed)
[facility] NEPHROLOGY PROGRESS NOTE  Assessment/ Plan: Pt is a 54 y.o. yo male with history of hypertension initially admitted with generalized weakness, body pain back pain found to have newly diagnosis of multiple myeloma with a lytic bone lesions, AKI (cr 4.9) and hypercalcemia (calcium >15).  Seen by our nephrology team and signed off on 8/15 when creatinine level improved to 2.69.  Now undergoing inpatient rehab and we are reconsulted for worsening hyponatremia.  #Acute kidney injury due to multiple myeloma and hypercalcemia causing prerenal status.  Serum creatinine level continue to improve.  UA bland. Monitor urine output, strict ins and out, daily lab.  #Hyponatremia likely pseudohyponatremia due to paraproteinemia however worsening sodium level probably due to dehydration.  He has reduced oral intake and looks dry on physical exam.  TSH level acceptable. Urinary studies consistent with dehydration.  Serum sodium level mildly improved with NS.  Continue for now. Follow-up free light chain He is asymptomatic from hyponatremia.  Monitor lab.  #Newly diagnosed IgG kappa multiple myeloma with skeletal involvement and cord compression: Seen by oncology.  Treated with radiation therapy for cord compression.  On a steroid.  Plan for chemotherapy as outpatient per oncology.  #Hypercalcemia due to MM with lytic bone lesion: Received pamidronate and calcitonin in the past.  Received a dose of zoledronic acid on 8/27, continue IVF.  Calcium level improving.  #Hypertension: Blood pressure acceptable.  Continue amlodipine, carvedilol.  #Hyperkalemia: On Lokelma.  Potassium level acceptable.  Subjective: Seen and examined in rehab.  No new event.  Has nausea vomiting chest pain shortness of breath.  Labs improving.  Objective Vital signs in last 24 hours: Vitals:   03/04/20 1233 03/04/20 1512 03/04/20 1933 03/05/20 0323  BP:  (!) 143/84 132/76 136/80  Pulse:  80 81 78  Resp:   _0 Temp:    98.7 F (37.1 C)  SpO2:  99% 99% 98%  Weight: 62.9 kg     Height:       Weight change: 0.9 kg  Intake/Output Summary (Last 24 hours) at 03/05/2020 1123 Last data filed at 03/05/2020 0900 Gross per 24 hour  Intake 487 ml  Output 3175 ml  Net -2688 ml       Labs: Basic Metabolic Panel: Recent Labs  Lab 03/03/20 0612 03/04/20 0633 03/05/20 0510  NA 123* 122* 124*  K 4.6 4.8 4.5  CL 90* 90* 93*  CO2 22 20* 23  GLUCOSE 200* 148* 203*  BUN 62* 62* 53*  CREATININE 1.49* 1.53* 1.30*  CALCIUM 11.7* 11.8* 11.5*   Liver Function Tests: Recent Labs  Lab 03/04/20 0633  AST 35  ALT 28  ALKPHOS 115  BILITOT 0.7  PROT 6.7  ALBUMIN 2.7*   No results for input(s): LIPASE, AMYLASE in the last 168 hours. No results for input(s): AMMONIA in the last 168 hours. CBC: Recent Labs  Lab 02/28/20 0518 02/28/20 0518 02/29/20 0927 02/29/20 0927 03/01/20 0612 03/02/20 0541 03/04/20 0633  WBC 7.3   < > 9.5   < > 10.7* 10.3 7.9  NEUTROABS 6.3   < > 8.4*   < > 9.2* 8.9* 6.7  HGB 10.2*   < > 11.2*   < > 11.4* 11.4* 10.4*  HCT 29.8*   < > 32.5*   < > 33.0* 32.7* 30.2*  MCV 86.4  --  86.0  --  86.4 85.4 83.7  PLT 170   < > 156   < > 168 148* 121*   < > =  values in this interval not displayed.   Cardiac Enzymes: No results for input(s): CKTOTAL, CKMB, CKMBINDEX, TROPONINI in the last 168 hours. CBG: Recent Labs  Lab 03/04/20 1131 03/04/20 1642 03/04/20 2045 03/05/20 0615 03/05/20 1114  GLUCAP 188* 267* 309* 178* 270*    Iron Studies: No results for input(s): IRON, TIBC, TRANSFERRIN, FERRITIN in the last 72 hours. Studies/Results: DG Abd 1 View  Result Date: 03/03/2020 CLINICAL DATA:  Cord compression.  Left upper quadrant pain. EXAM: ABDOMEN - 1 VIEW COMPARISON:  CT 02/14/2020. FINDINGS: Distended loops of small and large bowel noted suggesting adynamic ileus. Recent CT of the abdomen pelvis of 02/14/2020 did reveal a right inguinal hernia with  herniation of fat. No free air. Bony lesions present best identified by prior CT. IMPRESSION: Distended loops of small and large bowel noted suggesting adynamic ileus. Recent CT of the abdomen pelvis of 02/14/2020 however did reveal a right inguinal hernia with herniation of fat. Follow-up exams suggested to demonstrate resolution of bowel distention in order to exclude bowel obstruction. Electronically Signed   By: Marcello Moores  Register   On: 03/03/2020 13:56   VAS [place] LOWER EXTREMITY VENOUS (DVT)  Result Date: 03/03/2020  Lower Venous DVTStudy Indications: Immobility.  Performing Technologist: Griffin Basil RCT RDMS  Examination Guidelines: A complete evaluation includes B-mode imaging, spectral Doppler, color Doppler, and power Doppler as needed of all accessible portions of each vessel. Bilateral testing is considered an integral part of a complete examination. Limited examinations for reoccurring indications may be performed as noted. The reflux portion of the exam is performed with the patient in reverse Trendelenburg.  +---------+---------------+---------+-----------+----------+--------------+ RIGHT    CompressibilityPhasicitySpontaneityPropertiesThrombus Aging +---------+---------------+---------+-----------+----------+--------------+ CFV      Full           Yes      Yes                                 +---------+---------------+---------+-----------+----------+--------------+ SFJ      Full                                                        +---------+---------------+---------+-----------+----------+--------------+ FV Prox  Full                                                        +---------+---------------+---------+-----------+----------+--------------+ FV Mid   Full                                                        +---------+---------------+---------+-----------+----------+--------------+ FV DistalFull                                                         +---------+---------------+---------+-----------+----------+--------------+ PFV      Full                                                        +---------+---------------+---------+-----------+----------+--------------+  POP      Full           Yes      Yes                                 +---------+---------------+---------+-----------+----------+--------------+ PTV      Full                                                        +---------+---------------+---------+-----------+----------+--------------+ PERO     Full                                                        +---------+---------------+---------+-----------+----------+--------------+   +---------+---------------+---------+-----------+----------+--------------+ LEFT     CompressibilityPhasicitySpontaneityPropertiesThrombus Aging +---------+---------------+---------+-----------+----------+--------------+ CFV      Full           Yes      Yes                                 +---------+---------------+---------+-----------+----------+--------------+ SFJ      Full                                                        +---------+---------------+---------+-----------+----------+--------------+ FV Prox  Full                                                        +---------+---------------+---------+-----------+----------+--------------+ FV Mid   Full                                                        +---------+---------------+---------+-----------+----------+--------------+ FV DistalFull                                                        +---------+---------------+---------+-----------+----------+--------------+ PFV      Full                                                        +---------+---------------+---------+-----------+----------+--------------+ POP      Full           Yes      Yes                                  +---------+---------------+---------+-----------+----------+--------------+  PTV      Full                                                        +---------+---------------+---------+-----------+----------+--------------+ PERO     Full                                                        +---------+---------------+---------+-----------+----------+--------------+     Summary: RIGHT: - There is no evidence of deep vein thrombosis in the lower extremity.  - No cystic structure found in the popliteal fossa.  LEFT: - There is no evidence of deep vein thrombosis in the lower extremity.  - No cystic structure found in the popliteal fossa.  *See table(s) above for measurements and observations. Electronically signed by Monica Martinez MD on 03/03/2020 at 3:26:05 PM.    Final     Medications: Infusions: . sodium chloride 100 mL/hr at 03/05/20 0505    Scheduled Medications: . (feeding supplement) PROSource Plus  30 mL Oral BID BM  . amLODipine  10 mg Oral Daily  . atorvastatin  20 mg Oral Daily  . carvedilol  3.125 mg Oral BID WC  . Chlorhexidine Gluconate Cloth  6 each Topical Q0600  . dexamethasone (DECADRON) injection  4 mg Intravenous Q8H  . enoxaparin (LOVENOX) injection  40 mg Subcutaneous Q24H  . insulin aspart  0-9 Units Subcutaneous TID WC  . insulin aspart  3 Units Subcutaneous TID WC  . insulin glargine  11 Units Subcutaneous QHS  . lidocaine  1 patch Transdermal Q24H  . pantoprazole  40 mg Oral Daily  . polyethylene glycol  17 g Oral Daily  . Ensure Max Protein  11 oz Oral BID BM  . senna-docusate  2 tablet Oral Q lunch  . sodium chloride flush  10-40 mL Intracatheter Q12H  . sodium zirconium cyclosilicate  10 g Oral Daily    have reviewed scheduled and prn medications.  Physical Exam: General:NAD, sitting on bed comfortable Heart:RRR, s1s2 nl Lungs:clear b/l, no crackle Abdomen:soft, Non-tender, non-distended Extremities:No edema   Trevor Shelton Tanna Furry 03/05/2020,11:23 AM  LOS: 2 days  Pager: [phone]

## 2020-03-06 ENCOUNTER — Inpatient Hospital Stay (HOSPITAL_COMMUNITY): Payer: Self-pay | Admitting: Occupational Therapy

## 2020-03-06 ENCOUNTER — Inpatient Hospital Stay (HOSPITAL_COMMUNITY): Payer: Self-pay | Admitting: Physical Therapy

## 2020-03-06 LAB — BASIC METABOLIC PANEL
Anion gap: 6 (ref 5–15)
BUN: 48 mg/dL — ABNORMAL HIGH (ref 6–20)
CO2: 21 mmol/L — ABNORMAL LOW (ref 22–32)
Calcium: 9.7 mg/dL (ref 8.9–10.3)
Chloride: 98 mmol/L (ref 98–111)
Creatinine, Ser: 1.24 mg/dL (ref 0.61–1.24)
GFR calc Af Amer: >60 mL/min (ref >60)
GFR calc non Af Amer: >60 mL/min (ref >60)
Glucose, Bld: 188 mg/dL — ABNORMAL HIGH (ref 70–99)
Potassium: 4.5 mmol/L (ref 3.5–5.1)
Sodium: 125 mmol/L — ABNORMAL LOW (ref 135–145)

## 2020-03-06 LAB — GLUCOSE, CAPILLARY
Glucose-Capillary: 173 mg/dL — ABNORMAL HIGH (ref 70–99)
Glucose-Capillary: 219 mg/dL — ABNORMAL HIGH (ref 70–99)
Glucose-Capillary: 230 mg/dL — ABNORMAL HIGH (ref 70–99)
Glucose-Capillary: 235 mg/dL — ABNORMAL HIGH (ref 70–99)

## 2020-03-06 MED ORDER — INSULIN GLARGINE 100 UNIT/ML ~~LOC~~ SOLN
12.0000 [IU] | Freq: Every day | SUBCUTANEOUS | Status: DC
  Administered 2020-03-06 (×2): 12 [IU] via SUBCUTANEOUS
  Filled 2020-03-06 (×2): qty 0.12

## 2020-03-06 NOTE — Progress Notes (Signed)
Physical Therapy Session Note  Patient Details  Name: Trevor Shelton MRN: [id number] Date of Birth: Apr 27, 1966  Today's Date: 03/06/2020 PT Individual Time: 1000-1055; 9381-0175 PT Individual Time Calculation (min): 55 min and 45 min PT Missed Time: 30 min Missed Time Reason: patient fatigue  Short Term Goals: Week 1:  PT Short Term Goal 1 (Week 1): pt to demonstrate dynamic sitting balance at CGA for 33mins PT Short Term Goal 2 (Week 1): pt to demonstrate min A x1 for rolling consistently PT Short Term Goal 3 (Week 1): pt to demonstrate transfers to/from [facility] mod A with LRAD PT Short Term Goal 4 (Week 1): pt to self propel WC for 100' CGA with setup for parts  Skilled Therapeutic Interventions/Progress Updates:    Session 1: Pt received seated in w/c in room, agreeable to PT session. In-person interpreter not present for session, pt requesting use of video interpreter. Utilized video interpreter throughout therapy session. Pt is able to propel w/c x 100 ft with use of BUE at Supervision level. Slide board transfer w/c to/from mat table x 4 reps with 4" step under BLE for improved support. Pt is min to mod A for transfer with assist needed for trunk control and cues for head/hips relationship during transfer. Also assisted pt with donning tennis shoes prior to transfer for improved stability. Session focus on sitting balance and core strengthening at EOM. Attempt to have pt perform L/R lateral leans but pt reports onset of rib pain with this activity, deferred. Seated mini-crunches 3 x 5 reps with min to mod A to achieve upright sitting. Initially allowed pt to utilize BUE to return to sitting then progressed to UE crossed across chest. Pt requires increased assist for crunches without UE support. Cued pt for breathing techniques during exercise as he tends to hold his breath. Seated BUE ball punch-outs 2 x 15 reps to fatigue with focus on core activation and upright posture. Pt fatigues quickly with  activity this date but remains motivated to participate as much as he is able. Slide board transfer back to w/c with mod A. Pt left seated in w/c in room with needs in reach, quick release belt and chair alarm in place at end of session.  Session 2: Pt received seated in w/c in room, agreeable to PT session. Pt reports intermittent rib pain during session, not rated. Use of repositioning for pain management. Manual w/c propulsion 2 x 150 ft with use of BUE at Supervision level. Sit to stand with assist x 2 to stedy. Stedy transfer to mat table. Session focus on BLE strengthening and NMR in standing in stedy. Pt unable to achieve full upright stance in stedy with heavy UE reliance in standing. Use of sheet around pt's hips to assist with hip and trunk extension. Pt exhibits increased tolerance for standing and improved posture with use of sheet around hips. Pt exhibits R lateral lean in standing, able to come to midline with verbal and manual cueing. Pt fatigues quickly with activity this afternoon, requests to return to bed. Stedy transfer back to w/c then back to bed. Sit to supine mod A for BLE management. Pt left semi-reclined in bed with needs in reach, bed alarm in place, wife present at end of session. Pt missed 30 min of scheduled therapy session due to fatigue.   Therapy Documentation Precautions:  Precautions Precautions: Back, Fall (per chart review, no specific back precautions however encouraged per pt comfort) Precaution Comments: Significant BLE weakness and ataxia-like movements (LLE>RLE);  back and rib precautions for comfort due to fxs Restrictions Weight Bearing Restrictions: No    Therapy/Group: Individual Therapy   Excell Seltzer, PT, DPT  03/06/2020, 12:49 PM

## 2020-03-06 NOTE — Progress Notes (Signed)
Occupational Therapy Session Note  Patient Details  Name: Trevor Shelton MRN: [id number] Date of Birth: [birth year]  Today's Date: 03/06/2020 OT Individual Time: 6389-3734 OT Individual Time Calculation (min): 64 min    Short Term Goals: Week 1:  OT Short Term Goal 1 (Week 1): Pt will be able to sit to EOB with min A. OT Short Term Goal 2 (Week 1): Pt will complete slide board transfer with mod A to prepare for toilet transfers. OT Short Term Goal 3 (Week 1): Pt will be able to push to stand with a RW with max A. OT Short Term Goal 4 (Week 1): from bed level, pt will don pants with mod A.  Skilled Therapeutic Interventions/Progress Updates:    Patient in bed, alert and pleasant.  No interpreter present for session.  He was able to communicate and understand general directions in [place].  Supine to sitting edge of bed with mod A.  SB transfer bed to w/c mod A.  Completed grooming, UB bathing and dressing w/c level at sink with set up and CS.   Sit to stand in stedy min A to pull pants down dependently.  Returned to sitting.  Doffed clothing with dressing stick mod A.  lower body bathing w/c level - mod A for lower legs (provided long handled sponge).  Donned pants with reacher mod A.  Provided education regarding use of sock aide - he was able to donn slipper socks with min A.  Sit to stand in stedy min A, dependent to wash buttocks and pull pants over hips.   Completed UB AROM activities, w/c push ups, trunk/posture activities, weight shift with good effort and results but fatigues quickly.  Reviewed importance of weight shift.  He remained seated in w/c at close of session.  Seat belt alarm set and call bell in reach.    Therapy Documentation Precautions:  Precautions Precautions: Back, Fall (per chart review, no specific back precautions however encouraged per pt comfort) Precaution Comments: Significant BLE weakness and ataxia-like movements (LLE>RLE); back and rib precautions for comfort due  to fxs Restrictions Weight Bearing Restrictions: No   Therapy/Group: Individual Therapy  Carlos Levering 03/06/2020, 7:26 AM

## 2020-03-06 NOTE — IPOC Note (Signed)
Overall Plan of Care [facility]) Patient Details Name: Jonael Paradiso MRN: [id number] DOB: January 20, 1966  Admitting Diagnosis: Incomplete paraplegia [facility])  Hospital Problems: Principal Problem:   Incomplete paraplegia ([place]) Active Problems:   Cord compression [facility])     Functional Problem List: Nursing Bladder, Bowel, Edema, Endurance, Medication Management, Nutrition, Pain, Safety, Sensory  PT Balance, Skin Integrity, Pain, Safety, Endurance, Motor, Sensory  OT Balance, Endurance, Pain, Motor, Other (Comment) (dizziness)  SLP    TR         Basic ADL's: OT Bathing, Dressing, Toileting     Advanced  ADL's: OT       Transfers: PT Bed Mobility, Bed to Chair  OT Toilet, Tub/Shower     Locomotion: PT Wheelchair Mobility, Ambulation     Additional Impairments: OT None  SLP        TR      Anticipated Outcomes Item Anticipated Outcome  Self Feeding Independent  Swallowing      Basic self-care  Min A  Toileting  Min A   Bathroom Transfers Min A  Bowel/Bladder  continent of bowel and bladder with moderate assist / if neurogenic Pt or Family  will be able to perform bowel program will perform I&O cath with min assist or family will be able to perform  Transfers  min A  Locomotion  supervision with WC mobility  Communication     Cognition     Pain  pain will be <2/10  Safety/Judgment  to be fall free while in rehab   Therapy Plan: PT Intensity: Minimum of 1-2 x/day ,45 to 90 minutes PT Frequency: 5 out of 7 days PT Duration Estimated Length of Stay: 2-3weeks OT Intensity: Minimum of 1-2 x/day, 45 to 90 minutes OT Frequency: 5 out of 7 days OT Duration/Estimated Length of Stay: 18-21 days     Due to the current state of emergency, patients may not be receiving their 3-hours of Medicare-mandated therapy.   Team Interventions: Nursing Interventions Patient/Family Education, Pain Management, Bladder Management, Medication Management, Discharge Planning, Bowel  Management, Skin Care/Wound Management, Disease Management/Prevention  PT interventions Ambulation/gait training, Discharge planning, DME/adaptive equipment instruction, Functional mobility training, Pain management, Psychosocial support, Splinting/orthotics, Therapeutic Activities, UE/LE Strength taining/ROM, Visual/perceptual remediation/compensation, Wheelchair propulsion/positioning, UE/LE Coordination activities, Therapeutic Exercise, Stair training, Skin care/wound management, Patient/family education, Neuromuscular re-education, Disease management/prevention, Academic librarian, Training and development officer  OT Interventions Training and development officer, Discharge planning, DME/adaptive equipment instruction, Functional mobility training, Neuromuscular re-education, Pain management, Psychosocial support, Patient/family education, Self Care/advanced ADL retraining, Therapeutic Activities, Therapeutic Exercise, UE/LE Strength taining/ROM, UE/LE Coordination activities  SLP Interventions    TR Interventions    SW/CM Interventions Discharge Planning, Psychosocial Support, Patient/Family Education   Barriers to Discharge MD  Medical stability  Nursing Inaccessible home environment, Decreased caregiver support, Home environment access/layout, Lack of/limited family support, Weight bearing restrictions, New diabetic    PT Inaccessible home environment, Home environment access/layout, Medication compliance, Pending chemo/radiation stair enterance to home, possibly limited WC access into bathroom  OT      SLP      SW       Team Discharge Planning: Destination: PT-Home ,OT- Home , SLP-  Projected Follow-up: PT-Home health PT, OT-  Home health OT, SLP-  Projected Equipment Needs: PT-To be determined, OT- 3 in 1 bedside comode, Tub/shower seat, SLP-  Equipment Details: PT- , OT-  Patient/family involved in discharge planning: PT- Patient,  OT-Patient, SLP-   MD ELOS: 3 weeks Medical Rehab  Prognosis:  Good Assessment: Mr.  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 He is on Lovenox for anticoagulation, oxycodone for pain control. Nephrology is following for his hyponatremia. He has newly diagnosed poorly controlled diabetes type 2. I have provided dietary counseling and ordered dietary consult. He was found to have adynamic ileus- now with regular BM. He has been started on chemotherapy for kappa light chain multiple myeloma. His pathological fractures at T6 and T9 have been treated with radiation and high dose steroids. He has a left Prevalon boot at night for his left foot drop. We have trialed Baclofen and Thorazine for his intractable hiccups.    See Team Conference Notes for weekly updates to the plan of care

## 2020-03-06 NOTE — Progress Notes (Signed)
[facility] PHYSICAL MEDICINE & REHABILITATION PROGRESS NOTE   Subjective/Complaints: Continues to have hiccups.  He and his wife are focused on limiting sugar and carbs from diet to improve diabetes control XR shows moderate stool- he is having BM now  ROS:  Pt denies SOB, abd pain, CP, N/V/C/D, and vision changes   Objective:   DG Abd 1 View  Result Date: 03/05/2020 CLINICAL DATA:  Abdominal pain and bloating. EXAM: ABDOMEN - 1 VIEW COMPARISON:  Abdominal radiograph 03/03/2020, CT 02/14/2020 FINDINGS: No evidence of bowel obstruction on the supine views. Mild gaseous distention of stomach with slight improvement from prior. Mild gaseous small bowel distension and a generalized pattern. Moderate colonic stool burden. No abnormal rectal distention. No radiopaque calculi. No acute osseous abnormalities are seen. IMPRESSION: Ileus bowel gas pattern with mild gaseous distension of small bowel loops and stomach with slight improvement from recent exam. No evidence of developing obstruction. Moderate colonic stool burden. Electronically Signed   By: Keith Rake M.D.   On: 03/05/2020 15:47   Recent Labs    03/04/20 0633  WBC 7.9  HGB 10.4*  HCT 30.2*  PLT 121*   Recent Labs    03/05/20 0510 03/06/20 0517  NA 124* 125*  K 4.5 4.5  CL 93* 98  CO2 23 21*  GLUCOSE 203* 188*  BUN 53* 48*  CREATININE 1.30* 1.24  CALCIUM 11.5* 9.7    Intake/Output Summary (Last 24 hours) at 03/06/2020 1210 Last data filed at 03/06/2020 0739 Gross per 24 hour  Intake 436 ml  Output 3300 ml  Net -2864 ml     Physical Exam: Vital Signs Blood pressure 132/74, pulse 88, temperature 98 F (36.7 C), temperature source Oral, resp. rate 20, height '5\' 3"'  (1.6 m), weight 62.6 kg, SpO2 97 %. General: Alert and oriented x 3, No apparent distress HEENT: Head is normocephalic, atraumatic, PERRLA, EOMI, sclera anicteric, oral mucosa pink and moist, dentition intact, ext ear canals clear,  Neck: Supple  without JVD or lymphadenopathy Heart: Reg rate and rhythm. No murmurs rubs or gallops Chest: CTA bilaterally without wheezes, rales, or rhonchi; no distress Abdomen: Soft, non-tender, non-distended, bowel sounds positive. Musculoskeletal:     Cervical back: Neck supple. No rigidity.     Comments: UEs 5/5 in deltoid, biceps, triceps, WE, grip and finger abd B/L- pain with biceps testing due to rib fx's LEs- RLE- HF 3-/5, KE 3+/5, DF 4-/5, PF 5-/5 LLE- HF 2.5, KE 3/5, DF 2/5, PF 5-/5  Skin:    General: Skin is warm and dry.     Comments: IV in R wrist- looks good Large purple bruise on sacrum- a tiny puncture mark under it- healing well B/L knee brown squishy bruises Trace L foot edema- not ankle 2 radiation tags- chest and R side  Neurological: Ox3.   Light touch intact in all 4 extremities B/L Psychiatric:   frustrated over poor sleep    Assessment/Plan: 1. 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  Physiatrist is providing close team supervision and 24 hour management of active medical problems listed below.  Physiatrist and rehab team continue to assess barriers to discharge/monitor patient progress toward functional and medical goals  Care Tool:  Bathing    Body parts bathed by patient: Right arm, Left arm, Chest, Abdomen, Front perineal area (UB only today)  Bathing assist Assist Level: Moderate Assistance - Patient 50 - 74%     Upper Body Dressing/Undressing Upper body dressing   What is the patient wearing?: Pull over shirt    Upper body assist Assist Level: Moderate Assistance - Patient 50 - 74%    Lower Body Dressing/Undressing Lower body dressing      What is the patient wearing?: Pants     Lower body assist Assist for lower body dressing: Moderate Assistance - Patient 50 - 74%      Toileting Toileting    Toileting assist Assist for toileting: Contact Guard/Touching assist Assistive Device Comment: urinal   Transfers Chair/bed transfer  Transfers assist     Chair/bed transfer assist level: Dependent - mechanical lift (stedy)     Locomotion Ambulation   Ambulation assist   Ambulation activity did not occur: Safety/medical concerns          Walk 10 feet activity   Assist  Walk 10 feet activity did not occur: Safety/medical concerns        Walk 50 feet activity   Assist Walk 50 feet with 2 turns activity did not occur: Safety/medical concerns         Walk 150 feet activity   Assist Walk 150 feet activity did not occur: Safety/medical concerns         Walk 10 feet on uneven surface  activity   Assist Walk 10 feet on uneven surfaces activity did not occur: Safety/medical concerns         Wheelchair     Assist Will patient use wheelchair at discharge?: Yes Type of Wheelchair: Manual    Wheelchair assist level: Moderate Assistance - Patient 50 - 74% Max wheelchair distance: 150    Wheelchair 50 feet with 2 turns activity    Assist        Assist Level: Moderate Assistance - Patient 50 - 74%   Wheelchair 150 feet activity     Assist      Assist Level: Moderate Assistance - Patient 50 - 74%   Blood pressure 132/74, pulse 88, temperature 98 F (36.7 C), temperature source Oral, resp. rate 20, height '5\' 3"'  (1.6 m), weight 62.6 kg, SpO2 97 %.   Medical Problem List and Plan: 1.  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  Antithrombotics: -DVT/anticoagulation:  Pharmaceutical: Lovenox   8/27- at high risk for DVT due to SCI and cancer being treated             --BLE dopplers negative for DVT              -antiplatelet therapy: N/a 3. Pain Management: Continue oxycodone --increase to 10 mg q 4 hrs prn--d/c IV dilaudid.  8/29 pain is stable 4. Mood: LCSW to follow for evaluation and support.              -antipsychotic agents: N/a 5. Neuropsych: This patient is capable of making decisions on his own behalf. 6. Skin/Wound Care: Routine pressure relief measures.  7. Fluids/Electrolytes/Nutrition: Strict I/O. Check lytes in am.  8. Acute renal failure: Improving 81/4.92-->62/1.49 9. Hyponatremia: Recurrent hyponatremia. Improving to 124 on 8/28.  10. New diagnosis T2DM: Hgb A1c-6.5. BS poorly controlled  due to steriods as well as Ensure tid between meals. Will change ensure to Ensure Max --continue Lantus 10 units at bedtime with 3 units meal coverage tid as well as SSI for elevated BS.    CBG (last 3)  Recent Labs    03/05/20 2053 03/06/20 0547 03/06/20 1118  GLUCAP 233* 173* 219*    8/28: Increase Lantus to 11U. Provided dietary counseling. Ordered dietary consult.   8/29: uncontrolled. Increase Lantus to 12U 11. Constipation: Continue miralax am/change senna to lunch. KUB done showing ileus?  Wife/patient reports lot of gas and that he has been having BM daily. Pt says as well, BM daily.   8/27- adynamic ileus yesterday- will make sure pt has BM and recheck KUB in AM  8/29: KUB reviewed and shows moderate stool burden- he is having BM now 12. Mild BPH: will monitor voiding with PVR checks.  13. 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 Pathologic Fx T6 and T9:  Treated with XRT 10/10 and high dose steroids.  15. L foot drop- get prevalon boot on L at night  16. Hyponatremia  8/27- Na 122 this AM- will call Nephrology, esp because K+ 4.8 and was on Lokelma BID- changed to daily and held dose this AM.  17. Hiccups- for 1 week  8/27- on Thorazine- will add baclofen 10 mg TID prn for hiccups    LOS: 3 days A FACE TO FACE EVALUATION WAS PERFORMED  Martha Clan P  Raciel Caffrey 03/06/2020, 12:10 PM

## 2020-03-06 NOTE — Progress Notes (Signed)
[facility] NEPHROLOGY PROGRESS NOTE  Assessment/ Plan: Pt is a 54 y.o. yo male with history of hypertension initially admitted with generalized weakness, body pain back pain found to have newly diagnosis of multiple myeloma with a lytic bone lesions, AKI (cr 4.9) and hypercalcemia (calcium >15).  Seen by our nephrology team and signed off on 8/15 when creatinine level improved to 2.69.  Now undergoing inpatient rehab and we are reconsulted for worsening hyponatremia.  #Acute kidney injury due to multiple myeloma and hypercalcemia causing prerenal status.  Serum creatinine level continue to improve.  UA bland. Monitor urine output, strict ins and out, daily lab.  #Hyponatremia likely pseudohyponatremia due to paraproteinemia however worsening sodium level probably due to dehydration.   Serum sodium level 125 today, this is pseudohyponatremia as serum osmolality is within range.  TSH level acceptable.  Discontinue IV fluid, follow-up prolactin and monitor lab.  #Newly diagnosed IgG kappa multiple myeloma with skeletal involvement and cord compression: Seen by oncology.  Treated with radiation therapy for cord compression.  On a steroid.  Plan for chemotherapy as outpatient per oncology.  #Hypercalcemia due to MM with lytic bone lesion: Received pamidronate and calcitonin in the past.   Treated with a dose of zoledronic acid on 8/27, continue IVF.  Calcium level improved.  #Hypertension: Blood pressure acceptable.  Continue amlodipine, carvedilol.  #Hyperkalemia: On Lokelma.  Potassium level acceptable.  Subjective: Seen and examined in rehab.  Doing inpatient rehab.  Urine output 3.6 L.  Labs improving.  No nausea vomiting chest pain shortness of breath.  Objective Vital signs in last 24 hours: Vitals:   03/05/20 1349 03/05/20 1930 03/06/20 0401 03/06/20 0757  BP: 126/79 122/83 133/77 132/74  Pulse: 84 95 70 88  Resp: _0 Temp: 97.9 F (36.6 C) 98.1 F (36.7 C)  98 F (36.7 C)   TempSrc:  Oral Oral   SpO2: 100% 97% 97%   Weight:      Height:       Weight change: -0.3 kg  Intake/Output Summary (Last 24 hours) at 03/06/2020 1048 Last data filed at 03/06/2020 0739 Gross per 24 hour  Intake 436 ml  Output 3300 ml  Net -2864 ml       Labs: Basic Metabolic Panel: Recent Labs  Lab 03/04/20 0633 03/05/20 0510 03/06/20 0517  NA 122* 124* 125*  K 4.8 4.5 4.5  CL 90* 93* 98  CO2 20* 23 21*  GLUCOSE 148* 203* 188*  BUN 62* 53* 48*  CREATININE 1.53* 1.30* 1.24  CALCIUM 11.8* 11.5* 9.7   Liver Function Tests: Recent Labs  Lab 03/04/20 0633  AST 35  ALT 28  ALKPHOS 115  BILITOT 0.7  PROT 6.7  ALBUMIN 2.7*   No results for input(s): LIPASE, AMYLASE in the last 168 hours. No results for input(s): AMMONIA in the last 168 hours. CBC: Recent Labs  Lab 02/29/20 0927 02/29/20 0927 03/01/20 0612 03/02/20 0541 03/04/20 0633  WBC 9.5   < > 10.7* 10.3 7.9  NEUTROABS 8.4*   < > 9.2* 8.9* 6.7  HGB 11.2*   < > 11.4* 11.4* 10.4*  HCT 32.5*   < > 33.0* 32.7* 30.2*  MCV 86.0  --  86.4 85.4 83.7  PLT 156   < > 168 148* 121*   < > = values in this interval not displayed.   Cardiac Enzymes: No results for input(s): CKTOTAL, CKMB, CKMBINDEX, TROPONINI in the last 168 hours. CBG: Recent Labs  Lab 03/05/20 0615 03/05/20 1114 03/05/20 1638 03/05/20 2053 03/06/20 0547  GLUCAP 178* 270* 257* 233* 173*    Iron Studies: No results for input(s): IRON, TIBC, TRANSFERRIN, FERRITIN in the last 72 hours. Studies/Results: DG Abd 1 View  Result Date: 03/05/2020 CLINICAL DATA:  Abdominal pain and bloating. EXAM: ABDOMEN - 1 VIEW COMPARISON:  Abdominal radiograph 03/03/2020, CT 02/14/2020 FINDINGS: No evidence of bowel obstruction on the supine views. Mild gaseous distention of stomach with slight improvement from prior. Mild gaseous small bowel distension and a generalized pattern. Moderate colonic stool burden. No abnormal rectal distention. No  radiopaque calculi. No acute osseous abnormalities are seen. IMPRESSION: Ileus bowel gas pattern with mild gaseous distension of small bowel loops and stomach with slight improvement from recent exam. No evidence of developing obstruction. Moderate colonic stool burden. Electronically Signed   By: Keith Rake M.D.   On: 03/05/2020 15:47    Medications: Infusions:   Scheduled Medications: . (feeding supplement) PROSource Plus  30 mL Oral BID BM  . amLODipine  10 mg Oral Daily  . atorvastatin  20 mg Oral Daily  . carvedilol  3.125 mg Oral BID WC  . Chlorhexidine Gluconate Cloth  6 each Topical Q0600  . dexamethasone (DECADRON) injection  4 mg Intravenous Q8H  . enoxaparin (LOVENOX) injection  40 mg Subcutaneous Q24H  . insulin aspart  0-9 Units Subcutaneous TID WC  . insulin aspart  3 Units Subcutaneous TID WC  . insulin glargine  11 Units Subcutaneous QHS  . lidocaine  1 patch Transdermal Q24H  . pantoprazole  40 mg Oral Daily  . polyethylene glycol  17 g Oral Daily  . Ensure Max Protein  11 oz Oral BID BM  . senna-docusate  2 tablet Oral Q lunch  . sodium chloride flush  10-40 mL Intracatheter Q12H  . sodium zirconium cyclosilicate  10 g Oral Daily    have reviewed scheduled and prn medications.  Physical Exam: General:NAD, sitting on chair and participating in rehab Heart:RRR, s1s2 nl Lungs: Clear b/l, no crackle Abdomen:soft, Non-tender, non-distended Extremities:No edema   Alayne Estrella Tanna Furry 03/06/2020,10:48 AM  LOS: 3 days  Pager: [phone]

## 2020-03-07 ENCOUNTER — Inpatient Hospital Stay (HOSPITAL_COMMUNITY): Payer: Self-pay | Admitting: Physical Therapy

## 2020-03-07 ENCOUNTER — Inpatient Hospital Stay (HOSPITAL_COMMUNITY): Payer: Self-pay

## 2020-03-07 LAB — BASIC METABOLIC PANEL
Anion gap: 8 (ref 5–15)
BUN: 50 mg/dL — ABNORMAL HIGH (ref 6–20)
CO2: 21 mmol/L — ABNORMAL LOW (ref 22–32)
Calcium: 9.7 mg/dL (ref 8.9–10.3)
Chloride: 98 mmol/L (ref 98–111)
Creatinine, Ser: 1.37 mg/dL — ABNORMAL HIGH (ref 0.61–1.24)
GFR calc Af Amer: >60 mL/min (ref >60)
GFR calc non Af Amer: 57 mL/min — ABNORMAL LOW (ref >60)
Glucose, Bld: 214 mg/dL — ABNORMAL HIGH (ref 70–99)
Potassium: 4.6 mmol/L (ref 3.5–5.1)
Sodium: 127 mmol/L — ABNORMAL LOW (ref 135–145)

## 2020-03-07 LAB — GLUCOSE, CAPILLARY
Glucose-Capillary: 193 mg/dL — ABNORMAL HIGH (ref 70–99)
Glucose-Capillary: 307 mg/dL — ABNORMAL HIGH (ref 70–99)
Glucose-Capillary: 215 mg/dL — ABNORMAL HIGH (ref 70–99)
Glucose-Capillary: 244 mg/dL — ABNORMAL HIGH (ref 70–99)

## 2020-03-07 LAB — KAPPA/LAMBDA LIGHT CHAINS
Kappa free light chain: 36.8 mg/L — ABNORMAL HIGH (ref 3.3–19.4)
Kappa, lambda light chain ratio: 4.28 — ABNORMAL HIGH (ref 0.26–1.65)
Lambda free light chains: 8.6 mg/L (ref 5.7–26.3)

## 2020-03-07 LAB — CBC
HCT: 25.6 % — ABNORMAL LOW (ref 39.0–52.0)
Hemoglobin: 8.8 g/dL — ABNORMAL LOW (ref 13.0–17.0)
MCH: 28.7 pg (ref 26.0–34.0)
MCHC: 34.4 g/dL (ref 30.0–36.0)
MCV: 83.4 fL (ref 80.0–100.0)
Platelets: 79 10*3/uL — ABNORMAL LOW (ref 150–400)
RBC: 3.07 MIL/uL — ABNORMAL LOW (ref 4.22–5.81)
RDW: 11.8 % (ref 11.5–15.5)
WBC: 4.3 10*3/uL (ref 4.0–10.5)
nRBC: 0 % (ref 0.0–0.2)

## 2020-03-07 MED ORDER — DEXAMETHASONE 4 MG PO TABS
4.0000 mg | ORAL_TABLET | Freq: Three times a day (TID) | ORAL | Status: DC
  Administered 2020-03-07 – 2020-03-11 (×13): 4 mg via ORAL
  Filled 2020-03-07 (×15): qty 1

## 2020-03-07 MED ORDER — INSULIN GLARGINE 100 UNIT/ML ~~LOC~~ SOLN
14.0000 [IU] | Freq: Every day | SUBCUTANEOUS | Status: DC
  Administered 2020-03-07 – 2020-03-08 (×2): 14 [IU] via SUBCUTANEOUS
  Filled 2020-03-07 (×3): qty 0.14

## 2020-03-07 MED ORDER — BACLOFEN 10 MG PO TABS
10.0000 mg | ORAL_TABLET | Freq: Four times a day (QID) | ORAL | Status: DC
  Administered 2020-03-07 – 2020-03-19 (×49): 10 mg via ORAL
  Filled 2020-03-07 (×49): qty 1

## 2020-03-07 MED ORDER — CHLORPROMAZINE HCL 25 MG PO TABS
25.0000 mg | ORAL_TABLET | Freq: Four times a day (QID) | ORAL | Status: DC | PRN
  Administered 2020-03-14 – 2020-03-15 (×3): 25 mg via ORAL
  Filled 2020-03-07 (×6): qty 1

## 2020-03-07 NOTE — Progress Notes (Signed)
   Patient Details  Name: Trevor Shelton MRN: [id number] Date of Birth: [birth year]  Today's Date: 03/07/2020  Hospital Problems: Principal Problem:   Incomplete paraplegia [facility]) Active Problems:   Cord compression [facility])  Past Medical History:  Past Medical History:  Diagnosis Date  . AKI (acute kidney injury) ([place]) 02/2020  . Hypercalcemia 02/2020  . Hypercholesteremia   . Hypertension    Past Surgical History:  Past Surgical History:  Procedure Laterality Date  . HERNIA REPAIR    . IR FLUORO GUIDE CV LINE RIGHT  02/17/2020  . IR REMOVAL TUN CV CATH W/O FL  03/01/2020  . IR US GUIDE VASC ACCESS RIGHT  02/17/2020   Social History:  reports that he has never smoked. He has never used smokeless tobacco. He reports previous alcohol use. He reports previous drug use.  Family / Support Systems Marital Status: Married How Long?: 33 years Patient Roles: Spouse, Parent Spouse/Significant Other: Verdis Frederickson (wife): [phone] Children: 3 children; 2 adult children out of the homw. 16 y.o, dtr in the home Other Supports: none reported Anticipated Caregiver: Wife and 19 y.o. dtr Ability/Limitations of Caregiver: N/A Caregiver Availability: 24/7 Family Dynamics: Pt lives with his wife and 56 y.o. daughter  Social History Preferred language: Spanish Religion: Catholic Cultural Background: Pt has worked as a Curator for 20 years. Education: 6th grade Read: Yes Write: Yes Employment Status: Employed Public relations account executive Issues: Denies Guardian/Conservator: N/A   Abuse/Neglect Abuse/Neglect Assessment Can Be Completed: Yes Physical Abuse: Denies Verbal Abuse: Denies Sexual Abuse: Denies Exploitation of patient/patient's resources: Denies Self-Neglect: Denies  Emotional Status Pt's affect, behavior and adjustment status: Pt in good spirits at time of visit Recent Psychosocial Issues: Denies Psychiatric History: Denies Substance Abuse History: Denies; occassionla etoh on  occassion  Patient / Family Perceptions, Expectations & Goals Pt/Family understanding of illness & functional limitations: Pt and pt wife have general understanding of care needs Premorbid pt/family roles/activities: Independent Anticipated changes in roles/activities/participation: Assistance with ADLs/IADLs  Education officer, environmental Agencies: None Premorbid Home Care/DME Agencies: None Transportation available at discharge: wife Resource referrals recommended: Neuropsychology  Discharge Planning Living Arrangements: Spouse/significant other, Children Support Systems: Spouse/significant other, Children Type of Residence: Private residence Insurance Resources: Teacher, adult education Resources: Employment, Family Support Financial Screen Referred: Yes Living Expenses: Own Money Management: Patient, Spouse Does the patient have any problems obtaining your medications?: No Home Management: Wife manages housekeeping/meal prep Patient/Family Preliminary Plans: no changes Care Coordinator Barriers to Discharge: Decreased caregiver support, Lack of/limited family support, Other (comments) Care Coordinator Barriers to Discharge Comments: Uninsured Care Coordinator Anticipated Follow Up Needs: HH/OP  Clinical Impression SW met with pt and pt wife in room to introduce self explain role, and explain discharge process. Pt is not a English as a second language teacher. Per EMR, pt does not have green card. Pt is currently not eligible for Medicaid as he is required to perform a spend down. When discussing this with family, his wife reports that she will be available to provide care to him. No DME. SW encouraged pt wife to begin to explore potential DME that pt will need// SW informed will follow-up with Medicaid to discuss current reports no more funds.   Key Cen A Gwenda Heiner 03/07/2020, 9:05 AM

## 2020-03-07 NOTE — Progress Notes (Addendum)
Trevor Shelton   Subjective/Complaints:   Pt reports hiccups are really bothering him- he feels like meds work for 1-2 hours, but then "stop working" and then "he suffers day and night".   Still having mild pain from rib fx's- not asking for meds regularly- not that bad.   Per PT;  pt and wife are asking to speak with me about his  Dx, prognosis, and medical issues- I have set up time tomorrow ~ 1-1:30 pm tomorrow.   ROS:   Pt denies SOB, abd pain, CP, N/V/C/D, and vision changes    Objective:   No results found. Recent Labs    03/07/20 0528  WBC 4.3  HGB 8.8*  HCT 25.6*  PLT 79*   Recent Labs    03/06/20 0517 03/07/20 0528  NA 125* 127*  K 4.5 4.6  CL 98 98  CO2 21* 21*  GLUCOSE 188* 214*  BUN 48* 50*  CREATININE 1.24 1.37*  CALCIUM 9.7 9.7    Intake/Output Summary (Last 24 hours) at 03/07/2020 1309 Last data filed at 03/07/2020 1158 Gross per 24 hour  Intake 472 ml  Output 2550 ml  Net -2078 ml     Physical Exam: Vital Signs Blood pressure 122/74, pulse 89, temperature 99.5 F (37.5 C), temperature source Oral, resp. rate 18, height '5\' 3"'  (1.6 m), weight 63.4 kg, SpO2 97 %. General: sitting up in w/c at sink, doing bathing/sponge, appropriate, NAD HEENT: conjugate gaze Heart: RRR Chest: CTA B/L- no W/R/R- good air movement Abdomen: Soft, NT, ND, (+)BS  Musculoskeletal:     Cervical back: Neck supple. No rigidity.     Comments: UEs 5/5 in deltoid, biceps, triceps, WE, grip and finger abd B/L- pain with biceps testing due to rib fx's LEs- RLE- HF 3-/5, KE 3+/5, DF 4-/5, PF 5-/5 LLE- HF 2.5, KE 3/5, DF 2/5, PF 5-/5  Skin:    General: Skin is warm and dry.     Comments: IV in R wrist- looks good Large purple bruise on sacrum- a tiny puncture mark under it- healing well B/L knee brown squishy bruises No LE edema anymore 2 radiation tags- chest and R side  Neurological: Ox3.   Light touch intact in all 4  extremities B/L Psychiatric:   frustrated over hiccups    Assessment/Plan: 1. 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  Physiatrist is providing close team supervision and 24 hour management of active medical problems listed below.  Physiatrist and rehab team continue to assess barriers to discharge/monitor patient progress toward functional and medical goals  Care Tool:  Bathing    Body parts bathed by patient: Right arm, Left arm, Chest, Abdomen, Front perineal area, Right upper leg, Left upper leg, Face   Body parts bathed by helper: Buttocks, Right lower leg, Left lower leg     Bathing assist Assist Level: Moderate Assistance - Patient 50 - 74%     Upper Body Dressing/Undressing Upper body dressing   What is the patient wearing?: Pull over shirt    Upper body assist Assist Level: Supervision/Verbal cueing    Lower Body Dressing/Undressing Lower body dressing      What is the patient wearing?: Pants     Lower body assist Assist for lower body dressing: Maximal Assistance - Patient 25 - 49%     Toileting Toileting  Toileting assist Assist for toileting: Maximal Assistance - Patient 25 - 49% Assistive Device Comment: urinal   Transfers Chair/bed transfer  Transfers assist     Chair/bed transfer assist level: Moderate Assistance - Patient 50 - 74%     Locomotion Ambulation   Ambulation assist   Ambulation activity did not occur: Safety/medical concerns          Walk 10 feet activity   Assist  Walk 10 feet activity did not occur: Safety/medical concerns        Walk 50 feet activity   Assist Walk 50 feet with 2 turns activity did not occur: Safety/medical concerns         Walk 150 feet activity   Assist Walk 150 feet activity did not occur: Safety/medical  concerns         Walk 10 feet on uneven surface  activity   Assist Walk 10 feet on uneven surfaces activity did not occur: Safety/medical concerns         Wheelchair     Assist Will patient use wheelchair at discharge?: Yes Type of Wheelchair: Manual    Wheelchair assist level: Supervision/Verbal cueing Max wheelchair distance: 150'    Wheelchair 50 feet with 2 turns activity    Assist        Assist Level: Supervision/Verbal cueing   Wheelchair 150 feet activity     Assist      Assist Level: Supervision/Verbal cueing   Blood pressure 122/74, pulse 89, temperature 99.5 F (37.5 C), temperature source Oral, resp. rate 18, height '5\' 3"'  (1.6 m), weight 63.4 kg, SpO2 97 %.   Medical Problem List and Plan: 1.  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  Antithrombotics: -DVT/anticoagulation:  Pharmaceutical: Lovenox   8/27- at high risk for DVT due to SCI and cancer being treated             --BLE dopplers negative for DVT             -antiplatelet therapy: N/a 3. Pain Management: Continue oxycodone --increase to 10 mg q 4 hrs prn--d/c IV dilaudid.  8/29 pain is stable  8/30- pt said not asking regularly for pain meds 4. Mood: LCSW to follow for evaluation and support.              -antipsychotic agents: N/a 5. Neuropsych: This patient is capable of making decisions on his own behalf. 6. Skin/Wound Care: Routine pressure relief measures.  7. Fluids/Electrolytes/Nutrition: Strict I/O. Check lytes in am.  8. Acute renal failure: Improving 81/4.92-->62/1.49  8/30- Cr 1.37 and BUN 50 9. Hyponatremia: Recurrent hyponatremia. Improving to 124 on 8/28.  8/30- Na 127 today  10. New diagnosis T2DM: Hgb A1c-6.5. BS poorly controlled due to steriods as well as Ensure tid  between meals. Will change ensure to Ensure Max --continue Lantus 10 units at bedtime with 3 units meal coverage tid as well as SSI for elevated BS.    CBG (last 3)  Recent Labs    03/06/20 2049 03/07/20 0602 03/07/20 1145  GLUCAP 235* 193* 307*    8/28: Increase Lantus to 11U. Provided dietary counseling. Ordered dietary consult.   8/29: uncontrolled. Increase Lantus to 12U  8/30- Increase Lantus to 14 units 11. Constipation: Continue miralax am/change senna to lunch. KUB done showing ileus?  Wife/patient reports lot of gas and that he has been having BM daily. Pt says as well, BM daily.   8/27- adynamic ileus yesterday- will make sure pt has BM and recheck KUB in AM  8/29: KUB reviewed and shows moderate stool burden- he is having BM now 12. Mild BPH: will monitor voiding with PVR checks.  13. 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 Pathologic Fx T6 and T9:  Treated with XRT 10/10 and high dose steroids.  15. L foot drop- get prevalon boot on L at night  16. Hyponatremia/Hyperkalemia  8/27- Na 122 this AM- will call Nephrology, esp because K+ 4.8 and was on Lokelma BID- changed to daily and held dose this AM.  8/3-- can stop Lokelma per nephrology and Na up to 127- doing better-   17. Hiccups- for 1 week  8/27- on Thorazine- will add baclofen 10 mg TID prn for hiccups  8/30- will change to QID and schedule it- also asked nursing to give Thorazine when notices hiccups, if due- to try and help.  18. Thrombocytopenia  8/30- plts dropping- are 79k- if drop again by Wednesday, will need to see what else is possible other than Lovenox- will check with Heme/Onc.  18. Dispo  8/30- pt/family want to have discussion Tuesday 1-1:30 pm to discuss medical issues- will try and do this.     LOS: 4 days A FACE TO FACE EVALUATION WAS PERFORMED  Aqsa Sensabaugh 03/07/2020, 1:09 PM

## 2020-03-07 NOTE — Progress Notes (Signed)
Occupational Therapy Session Note  Patient Details  Name: Trevor Shelton MRN: [id number] Date of Birth: Sep 10, 1965  Today's Date: 03/07/2020 OT Individual Time: 7096-4383 OT Individual Time Calculation (min): 55 min    Short Term Goals: Week 1:  OT Short Term Goal 1 (Week 1): Pt will be able to sit to EOB with min A. OT Short Term Goal 2 (Week 1): Pt will complete slide board transfer with mod A to prepare for toilet transfers. OT Short Term Goal 3 (Week 1): Pt will be able to push to stand with a RW with max A. OT Short Term Goal 4 (Week 1): from bed level, pt will don pants with mod A.  Skilled Therapeutic Interventions/Progress Updates:    Pt resting in bed upon arrival. Interpreter not present and video interpreter utilized throughout session.  OT intervention with focus on bed mobility, sitting balance, slide board tranfsers, BADL retraining, sit<>stand in [place], and activity tolerance to increase independence with BADLs. Supine>sit EOB with mod A to move BLE off EOB. Sitting balance EOB with min A. Slide board transfer to w/c with mod A. See Care Tool for assist levels.  Sit<>stand in [place] X 2 with max A. Min verbal cues for back precautions. Pt remained in w/c with all needs within reach and belt alarm activated.   Therapy Documentation Precautions:  Precautions Precautions: Back, Fall (per chart review, no specific back precautions however encouraged per pt comfort) Precaution Comments: Significant BLE weakness and ataxia-like movements (LLE>RLE); back and rib precautions for comfort due to fxs Restrictions Weight Bearing Restrictions: No    Pain: Pt denies pain but c/o ongoing hiccups; pt reported to MD  Therapy/Group: Individual Therapy  Leroy Libman 03/07/2020, 8:57 AM

## 2020-03-07 NOTE — Progress Notes (Signed)
Patient ID: Trevor Shelton, male   DOB: 1966-06-23, 54 y.o.   MRN: [id number]  SW spoke with [facility] [phone]) to discuss Medicaid application. Reports that specialist from [place] met with family who reported they have no longer have existing account, and an application was submitted to Medicaid. Application status pending Medicaid decision.   Loralee Pacas, MSW, [place] Office: [phone] Cell: [phone] Fax: [phone]

## 2020-03-07 NOTE — Progress Notes (Addendum)
Physical Therapy Session Note  Patient Details  Name: Trevor Shelton MRN: [id number] Date of Birth: February 29, 1966  Today's Date: 03/07/2020 PT Individual Time: 1000-1100; 1300-1415 PT Individual Time Calculation (min): 60 min and 75 min  Short Term Goals: Week 1:  PT Short Term Goal 1 (Week 1): pt to demonstrate dynamic sitting balance at CGA for 22mins PT Short Term Goal 2 (Week 1): pt to demonstrate min A x1 for rolling consistently PT Short Term Goal 3 (Week 1): pt to demonstrate transfers to/from [facility] mod A with LRAD PT Short Term Goal 4 (Week 1): pt to self propel WC for 100' CGA with setup for parts  Skilled Therapeutic Interventions/Progress Updates:    Session 1: Pt received seated in w/c in room, reports feeling uncomfortable and fatigued from sitting up this AM. Pt also reports not sleeping well and that he has ongoing rib pain. Pt also expresses his frustration about lack of communication regarding his current diagnosis with him and with his family as well as staff not utilizing interpreter services. Provided support to patient and scheduled time for MD to have discussion with patient and his wife with interpreter present. Pt is at Supervision level for w/c mobility x 150 ft with use of BUE, cues for proper propulsion technique to maintain shoulder integrity. Slide board transfer w/c to mat table with mod A with max cueing for head/hips relationship and sequencing. Pt able to maintain sitting balance EOM while donning two pillowcases on pillows. Sit to supine mod A for BLE management. Supine BLE strengthening therex: bridges, heel slides, SKFO x 10-15 reps to fatigue with AAROM needed for BLE management. Pt returns to sitting EOB with max A for BLE management and trunk elevation. Slide board transfer back to w/c then back to bed with mod A. Sit to supine mod A for BLE management. Pt left semi-reclined in bed with needs in reach, bed alarm in place at end of session. Utilized video interpreter  services throughout session.  Session 2: Pt received seated in bed, agreeable to PT session. No complaints of pain. Semi-reclined to sitting EOB with mod A for some LE management and trunk control. Slide board transfer bed to w/c with mod A. Dependent transport via w/c to/from therapy gym for time conservation. Session focus on BLE NMR in standing with use of standing frame. Pt is able to perform standing in frame 4 x ~10 minutes. While in standing focus on knee/flexion extension, hip extension/glute activation, and decreased UE support. Pt able to maintain standing balance x 5 min with no UE support before onset of fatigue and trunk becomes flexed. Pt then able to perform sit to stand from perched position in sling to full upright stance x 10 reps with focus on use of LE>UE. Pt exhibits overall improved endurance and tolerance for standing this date as well as improved posture in standing frame. Pt requests to use bathroom at end of session. Stedy transfer to toilet. Pt left seated on BSC over toilet with call button in reach, NT notified of pt's location. Use of video language interpreter throughout session.  Therapy Documentation Precautions:  Precautions Precautions: Back, Fall (per chart review, no specific back precautions however encouraged per pt comfort) Precaution Comments: Significant BLE weakness and ataxia-like movements (LLE>RLE); back and rib precautions for comfort due to fxs Restrictions Weight Bearing Restrictions: No    Therapy/Group: Individual Therapy   Excell Seltzer, PT, DPT  03/07/2020, 12:05 PM

## 2020-03-07 NOTE — Progress Notes (Signed)
Resting at interval throughout shift, verbalize discomfort still with hiccups prn provided x2, some relief

## 2020-03-07 NOTE — Plan of Care (Signed)
Nutrition Education Note  RD consulted for nutrition education regarding diabetes.  Spoke with pt and wife at bedside. Pt has made many changes in his diet to help manage his diabetes. Pt reports that for meals at home he typically eats beans with some sort of protein like chicken or fish. Pt drinks mostly water but occasionally has diet sodas. Noted regular Gatorade at bedside. Discussed with pt and wife possibility of switching to G2 or other low-sugar electrolyte replacement beverage.  Lab Results  Component Value Date   HGBA1C 6.5 (H) 02/18/2020    RD provided "Carbohydrate Counting for People with Diabetes" handout from the Academy of Nutrition and Dietetics. Discussed different food groups and their effects on blood sugar, emphasizing carbohydrate-containing foods. Provided list of carbohydrates and recommended serving sizes of common foods.  Discussed importance of controlled and consistent carbohydrate intake throughout the day. Provided examples of ways to balance meals/snacks and encouraged intake of high-fiber, whole grain complex carbohydrates. Teach back method used.  Expect good compliance.  Body mass index is 24.76 kg/m. Pt meets criteria for normal weight based on current BMI.  Current diet order is Carb Modified, patient is consuming approximately 100% of meals at this time. Labs and medications reviewed. No further nutrition interventions warranted at this time. RD contact information provided. If additional nutrition issues arise, please re-consult RD.   Gaynell Face, MS, RD, LDN Inpatient Clinical Dietitian Please see [facility] for contact information.

## 2020-03-07 NOTE — Progress Notes (Signed)
[facility] ROUNDING NOTE   Subjective:   Brief history this is a 54 year old gentleman history of hypertension back pain diagnosed multiple myeloma with acute lytic bone lesions acute kidney injury with creatinine 4.9 mg/dL and hypercalcemia 15 mg/dL.  Undergoing inpatient rehab.  Creatinine actually improved to 2.69 mg/dL.  Reconsulted due to worsening hyponatremia.  This is thought to be a pseudohyponatremia secondary to paraproteinemia.  Blood pressure 122/74 pulse 89 temperature 9.5 O2 sats 97% room air urine output 3 L 03/06/2020.  Sodium 127 potassium 4.6 chloride 98 CO2 21 BUN 50 creatinine 1.37 glucose 214 calcium 9.7 hemoglobin 8.8 platelets 79  Amlodipine 10 mg daily atorvastatin 20 mg daily Coreg 3.125 mg daily Decadron 4 mg every 8 hours Lovenox 40 mg every 24 hours, insulin sliding scale Lantus 12 units nightly, Protonix 40 mg daily, Lokelma 10 g daily   Objective:  Vital signs in last 24 hours:  Temp:  [97.8 F (36.6 C)-100 F (37.8 C)] 99.5 F (37.5 C) (08/30 0415) Pulse Rate:  [88-91] 89 (08/30 0415) Resp:  [17-18] 18 (08/30 0415) BP: (118-122)/(74) 122/74 (08/30 0415) SpO2:  [97 %-100 %] 97 % (08/30 0415) Weight:  [63.4 kg] 63.4 kg (08/30 0415)  Weight change: 0.8 kg Filed Weights   03/04/20 1233 03/05/20 1233 03/07/20 0415  Weight: 62.9 kg 62.6 kg 63.4 kg    Intake/Output: I/O last 3 completed shifts: In: 732 [P.O.:732] Out: 5200 [Urine:5200]   Intake/Output this shift:  Total I/O In: 236 [P.O.:236] Out: -   Physical Exam: General:NAD, sitting on chair and participating in rehab Heart:RRR, s1s2 nl Lungs: Clear b/l, no crackle Abdomen:soft, Non-tender, non-distended Extremities:No edema   Basic Metabolic Panel: Recent Labs  Lab 02/29/20 0927 02/29/20 3007 03/01/20 0612 03/01/20 0612 03/02/20 0541 03/02/20 0541 03/03/20 0612 03/03/20 0612 03/04/20 6226 03/04/20 3335 03/05/20 0510 03/06/20 0517 03/07/20 0528  NA 129*   <  > 127*   < > 124*   < > 123*  --  122*  --  124* 125* 127*  K 5.4*   < > 5.4*   < > 5.3*   < > 4.6  --  4.8  --  4.5 4.5 4.6  CL 99   < > 95*   < > 92*   < > 90*  --  90*  --  93* 98 98  CO2 21*   < > 22   < > 23   < > 22  --  20*  --  23 21* 21*  GLUCOSE 174*   < > 192*   < > 241*   < > 200*  --  148*  --  203* 188* 214*  BUN 66*   < > 66*   < > 65*   < > 62*  --  62*  --  53* 48* 50*  CREATININE 1.63*   < > 1.73*   < > 1.56*   < > 1.49*  --  1.53*  --  1.30* 1.24 1.37*  CALCIUM 10.8*   < > 11.4*   < > 11.5*   < > 11.7*   < > 11.8*   < > 11.5* 9.7 9.7  MG 1.9  --  1.8  --  1.8  --   --   --   --   --   --   --   --    < > = values in this interval not displayed.    Liver Function Tests: Recent  Labs  Lab 03/04/20 0633  AST 35  ALT 28  ALKPHOS 115  BILITOT 0.7  PROT 6.7  ALBUMIN 2.7*   No results for input(s): LIPASE, AMYLASE in the last 168 hours. No results for input(s): AMMONIA in the last 168 hours.  CBC: Recent Labs  Lab 02/29/20 0927 03/01/20 0612 03/02/20 0541 03/04/20 0633 03/07/20 0528  WBC 9.5 10.7* 10.3 7.9 4.3  NEUTROABS 8.4* 9.2* 8.9* 6.7  --   HGB 11.2* 11.4* 11.4* 10.4* 8.8*  HCT 32.5* 33.0* 32.7* 30.2* 25.6*  MCV 86.0 86.4 85.4 83.7 83.4  PLT 156 168 148* 121* 79*    Cardiac Enzymes: No results for input(s): CKTOTAL, CKMB, CKMBINDEX, TROPONINI in the last 168 hours.  BNP: Invalid input(s): POCBNP  CBG: Recent Labs  Lab 03/06/20 0547 03/06/20 1118 03/06/20 1619 03/06/20 2049 03/07/20 0602  GLUCAP 173* 219* 230* 235* 193*    Microbiology: Results for orders placed or performed during the hospital encounter of 02/14/20  SARS Coronavirus 2 by RT PCR (hospital order, performed in [facility] hospital lab) Nasopharyngeal Nasopharyngeal Swab     Status: None   Collection Time: 02/14/20  4:05 PM   Specimen: Nasopharyngeal Swab  Result Value Ref Range Status   SARS Coronavirus 2 NEGATIVE NEGATIVE Final    Comment: (NOTE) SARS-CoV-2 target  nucleic acids are NOT DETECTED.  The SARS-CoV-2 RNA is generally detectable in upper and lower respiratory specimens during the acute phase of infection. The lowest concentration of SARS-CoV-2 viral copies this assay can detect is 250 copies / mL. A negative result does not preclude SARS-CoV-2 infection and should not be used as the sole basis for treatment or other patient management decisions.  A negative result may occur with improper specimen collection / handling, submission of specimen other than nasopharyngeal swab, presence of viral mutation(s) within the areas targeted by this assay, and inadequate number of viral copies (<250 copies / mL). A negative result must be combined with clinical observations, patient history, and epidemiological information.  Fact Sheet for Patients:   StrictlyIdeas.no  Fact Sheet for Healthcare Providers: BankingDealers.co.za  This test is not yet approved or  cleared by the [place] FDA and has been authorized for detection and/or diagnosis of SARS-CoV-2 by FDA under an Emergency Use Authorization (EUA).  This EUA will remain in effect (meaning this test can be used) for the duration of the COVID-19 declaration under Section 564(b)(1) of the Act, 21 U.S.C. section 360bbb-3(b)(1), unless the authorization is terminated or revoked sooner.  Performed at [facility], [place] [address] [place], [place] [address]     Coagulation Studies: No results for input(s): LABPROT, INR in the last 72 hours.  Urinalysis: No results for input(s): COLORURINE, LABSPEC, PHURINE, GLUCOSEU, HGBUR, BILIRUBINUR, KETONESUR, PROTEINUR, UROBILINOGEN, NITRITE, LEUKOCYTESUR in the last 72 hours.  Invalid input(s): APPERANCEUR    Imaging: No results found.   Medications:    . (feeding supplement) PROSource Plus  30 mL Oral BID BM  . amLODipine  10 mg Oral Daily  . atorvastatin  20 mg Oral Daily  .  carvedilol  3.125 mg Oral BID WC  . dexamethasone (DECADRON) injection  4 mg Intravenous Q8H  . enoxaparin (LOVENOX) injection  40 mg Subcutaneous Q24H  . insulin aspart  0-9 Units Subcutaneous TID WC  . insulin aspart  3 Units Subcutaneous TID WC  . insulin glargine  12 Units Subcutaneous QHS  . lidocaine  1 patch Transdermal Q24H  . pantoprazole  40  mg Oral Daily  . polyethylene glycol  17 g Oral Daily  . Ensure Max Protein  11 oz Oral BID BM  . senna-docusate  2 tablet Oral Q lunch  . sodium chloride flush  10-40 mL Intracatheter Q12H  . sodium zirconium cyclosilicate  10 g Oral Daily   acetaminophen, alum & mag hydroxide-simeth, baclofen, bisacodyl, chlorproMAZINE, clonazepam, diphenhydrAMINE, guaiFENesin-dextromethorphan, lidocaine, lidocaine, oxyCODONE, phenol, polyethylene glycol, prochlorperazine **OR** prochlorperazine **OR** prochlorperazine, simethicone, sodium chloride flush, traMADol, traZODone  Assessment/ Plan:  1.Acute kidney injury due to multiple myeloma and hypercalcemia causing prerenal status.  Serum creatinine stable.  2.Hyponatremia likely pseudohyponatremia due to paraproteinemia.  Sodium appears to be stable.  Improving.  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  Treated with radiation therapy for cord compression.  On a steroid.  Plan for chemotherapy as outpatient per oncology.  4.Hypercalcemia due to MM with lytic bone lesion: Received pamidronate and calcitonin in the past.   Treated with a dose of zoledronic acid on 8/27, continue IVF.  Calcium level improved.  5.Hypertension: Blood pressure acceptable.  Continue amlodipine, carvedilol.  6.Hyperkalemia: On Lokelma.  Potassium level acceptable.  We will discontinue Lokelma at this point.  LOS: [place] '@TODAY' '@8' :54 AM

## 2020-03-08 ENCOUNTER — Inpatient Hospital Stay (HOSPITAL_COMMUNITY): Payer: Self-pay | Admitting: Physical Therapy

## 2020-03-08 ENCOUNTER — Inpatient Hospital Stay (HOSPITAL_COMMUNITY): Payer: Self-pay

## 2020-03-08 ENCOUNTER — Inpatient Hospital Stay (HOSPITAL_COMMUNITY): Payer: Self-pay | Admitting: *Deleted

## 2020-03-08 LAB — BASIC METABOLIC PANEL
Anion gap: 9 (ref 5–15)
BUN: 45 mg/dL — ABNORMAL HIGH (ref 6–20)
CO2: 22 mmol/L (ref 22–32)
Calcium: 9.3 mg/dL (ref 8.9–10.3)
Chloride: 96 mmol/L — ABNORMAL LOW (ref 98–111)
Creatinine, Ser: 1.25 mg/dL — ABNORMAL HIGH (ref 0.61–1.24)
GFR calc Af Amer: >60 mL/min (ref >60)
GFR calc non Af Amer: >60 mL/min (ref >60)
Glucose, Bld: 217 mg/dL — ABNORMAL HIGH (ref 70–99)
Potassium: 4.5 mmol/L (ref 3.5–5.1)
Sodium: 127 mmol/L — ABNORMAL LOW (ref 135–145)

## 2020-03-08 LAB — GLUCOSE, CAPILLARY
Glucose-Capillary: 208 mg/dL — ABNORMAL HIGH (ref 70–99)
Glucose-Capillary: 234 mg/dL — ABNORMAL HIGH (ref 70–99)
Glucose-Capillary: 220 mg/dL — ABNORMAL HIGH (ref 70–99)
Glucose-Capillary: 219 mg/dL — ABNORMAL HIGH (ref 70–99)

## 2020-03-08 NOTE — Progress Notes (Signed)
Physical Therapy Session Note  Patient Details  Name: Trevor Shelton MRN: [id number] Date of Birth: [birth year]  Today's Date: 03/08/2020 PT Individual Time: 1000-1100 PT Individual Time Calculation (min): 60 min   Short Term Goals: Week 1:  PT Short Term Goal 1 (Week 1): pt to demonstrate dynamic sitting balance at CGA for 57mins PT Short Term Goal 2 (Week 1): pt to demonstrate min A x1 for rolling consistently PT Short Term Goal 3 (Week 1): pt to demonstrate transfers to/from [facility] mod A with LRAD PT Short Term Goal 4 (Week 1): pt to self propel WC for 100' CGA with setup for parts Skilled Therapeutic Interventions/Progress Updates:    pt received in [facility] and agreeable to therapy in-person interpreter present throughout session. Pt reported slight discomfort in L flank/irb cage but reported this wasn't bad. Pt directed in [place] mobility for 150' CGA and one instance of min A to correct with turn, VC for tech with pushing with BUE. Pt directed in slide board transfer to mat table, min A x1 to complete with VC for pt to wait for therapist to adjust BLE before initiating transfer however other than this pt able to transfer at min A. Pt directed in dynamic sitting balance at EOM at min A grossly for safety with VC for trunk extension to tolerance, and to return to midline with reaching activity, 3x15 reaching with BUE in multiple planed directions with increased difficulty noted to L with reaching outward ad frequent LOB requiring min A to right. Pt directed in overhead reaches x10 min A and multiple  LOB in posterior direction with min A to correct. Pt directed in slide board transfer back to [facility] at min A with similar VC and assist levels, manual assist with BLE for placement throughout required. Pt directed in 2x10 each LE with targeted stepping pattern in seated position for improved coordination and accurate placement of BLE. Pt also had many questions about meeting with MD and his interpreter needs, all questions  answered and pt updated as well as MD to improved time for parties to meet for pt medical concerns and questions. Pt left in WC, alarm belt set, All needs in reach and in good condition. Call light in hand.    Therapy Documentation Precautions:  Precautions Precautions: Back, Fall (per chart review, no specific back precautions however encouraged per pt comfort) Precaution Comments: Significant BLE weakness and ataxia-like movements (LLE>RLE); back and rib precautions for comfort due to fxs Restrictions Weight Bearing Restrictions: No  Therapy Vitals Temp: (!) 97.5 F (36.4 C) Pulse Rate: 79 Resp: 18 BP: 113/71 Patient Position (if appropriate): Sitting Oxygen Therapy SpO2: 98 % O2 Device: Room Air Pain: Pain Assessment Pain Scale: 0-10 Pain Score: 6  Pain Type: Acute pain Pain Location: Other (Comment) (around ribe cage into back) Pain Descriptors / Indicators: Aching;Discomfort   Therapy/Group: Individual Therapy  [name] 03/08/2020, 2:29 PM

## 2020-03-08 NOTE — Progress Notes (Signed)
Occupational Therapy Session Note  Patient Details  Name: Nery Frappier MRN: [id number] Date of Birth: 1966/02/09  Today's Date: 03/08/2020 OT Individual Time: 0800-0900 OT Individual Time Calculation (min): 60 min    Short Term Goals: Week 1:  OT Short Term Goal 1 (Week 1): Pt will be able to sit to EOB with min A. OT Short Term Goal 2 (Week 1): Pt will complete slide board transfer with mod A to prepare for toilet transfers. OT Short Term Goal 3 (Week 1): Pt will be able to push to stand with a RW with max A. OT Short Term Goal 4 (Week 1): from bed level, pt will don pants with mod A.  Skilled Therapeutic Interventions/Progress Updates:    Pt resting in bed upon arrival. OT intervention with focus on bed mobility, sitting balance, SB tranfsers, BADL retraining, and activity tolerance to increase independence with BADLs. Supine>sit EOB with min A. Min A for sitting balance.  SB transfer to w/c with min A. Bathing with mod A sit<>stand in [place]. LB dressing with max A with sit<>stand in [place]. W/c mobility with supervision. Sitting balance EOM with min A and mod A for dynamic sitting balance. Pt remained seated in w/c with all needs within reach and belt alarm activated.   Therapy Documentation Precautions:  Precautions Precautions: Back, Fall (per chart review, no specific back precautions however encouraged per pt comfort) Precaution Comments: Significant BLE weakness and ataxia-like movements (LLE>RLE); back and rib precautions for comfort due to fxs Restrictions Weight Bearing Restrictions: No Pain:  Pt c/o increased pain across mid rib cage with activity; MD aware, repositioned   Therapy/Group: Individual Therapy  Leroy Libman 03/08/2020, 9:02 AM

## 2020-03-08 NOTE — Progress Notes (Signed)
[facility] ROUNDING NOTE   Subjective:   Brief history this is a 54 year old gentleman history of hypertension back pain diagnosed multiple myeloma with acute lytic bone lesions acute kidney injury with creatinine 4.9 mg/dL and hypercalcemia 15 mg/dL.  Undergoing inpatient rehab.  Creatinine actually improved to 2.69 mg/dL.  Reconsulted due to worsening hyponatremia.  This is thought to be a pseudohyponatremia secondary to paraproteinemia.  Blood pressure 08/02/1971 pulse 76 temperature 98 O2 sats 97% room air.  Urine output 2.4 L 03/07/2020  Sodium 127 potassium 4.5 chloride 96 CO2 22 BUN 45 creatinine 1.25 glucose 217 hemoglobin 8.8  Amlodipine 10 mg daily atorvastatin 20 mg daily Coreg 3.125 mg daily Decadron 4 mg every 8 hours Lovenox 40 mg every 24 hours, insulin sliding scale Lantus 12 units nightly, Protonix 40 mg daily, Lokelma 10 g daily   Objective:  Vital signs in last 24 hours:  Temp:  [97.6 F (36.4 C)-98 F (36.7 C)] 98 F (36.7 C) (08/31 0520) Pulse Rate:  [76-95] 76 (08/31 0520) Resp:  [16-18] 16 (08/31 0520) BP: (121-125)/(71-77) 125/71 (08/31 0520) SpO2:  [97 %-100 %] 97 % (08/31 0520) Weight:  [62.2 kg] 62.2 kg (08/31 0520)  Weight change: -1.2 kg Filed Weights   03/05/20 1233 03/07/20 0415 03/08/20 0520  Weight: 62.6 kg 63.4 kg 62.2 kg    Intake/Output: I/O last 3 completed shifts: In: 826 [P.O.:816; I.V.:10] Out: 4450 [Urine:4450]   Intake/Output this shift:  Total I/O In: 240 [P.O.:240] Out: -   Physical Exam: General:NAD, sitting on chair and participating in rehab Heart:RRR, s1s2 nl Lungs: Clear b/l, no crackle Abdomen:soft, Non-tender, non-distended Extremities:No edema   Basic Metabolic Panel: Recent Labs  Lab 03/02/20 0541 03/03/20 0612 03/04/20 3299 03/04/20 2426 03/05/20 0510 03/05/20 0510 03/06/20 0517 03/07/20 0528 03/08/20 0620  NA 124*   < > 122*  --  124*  --  125* 127* 127*  K 5.3*   < > 4.8  --  4.5  --   4.5 4.6 4.5  CL 92*   < > 90*  --  93*  --  98 98 96*  CO2 23   < > 20*  --  23  --  21* 21* 22  GLUCOSE 241*   < > 148*  --  203*  --  188* 214* 217*  BUN 65*   < > 62*  --  53*  --  48* 50* 45*  CREATININE 1.56*   < > 1.53*  --  1.30*  --  1.24 1.37* 1.25*  CALCIUM 11.5*   < > 11.8*   < > 11.5*   < > 9.7 9.7 9.3  MG 1.8  --   --   --   --   --   --   --   --    < > = values in this interval not displayed.    Liver Function Tests: Recent Labs  Lab 03/04/20 0633  AST 35  ALT 28  ALKPHOS 115  BILITOT 0.7  PROT 6.7  ALBUMIN 2.7*   No results for input(s): LIPASE, AMYLASE in the last 168 hours. No results for input(s): AMMONIA in the last 168 hours.  CBC: Recent Labs  Lab 03/02/20 0541 03/04/20 0633 03/07/20 0528  WBC 10.3 7.9 4.3  NEUTROABS 8.9* 6.7  --   HGB 11.4* 10.4* 8.8*  HCT 32.7* 30.2* 25.6*  MCV 85.4 83.7 83.4  PLT 148* 121* 79*    Cardiac Enzymes: No results for  input(s): CKTOTAL, CKMB, CKMBINDEX, TROPONINI in the last 168 hours.  BNP: Invalid input(s): POCBNP  CBG: Recent Labs  Lab 03/07/20 0602 03/07/20 1145 03/07/20 1625 03/07/20 2052 03/08/20 0559  GLUCAP 193* 307* 215* 244* 208*    Microbiology: Results for orders placed or performed during the hospital encounter of 02/14/20  SARS Coronavirus 2 by RT PCR (hospital order, performed in [facility] hospital lab) Nasopharyngeal Nasopharyngeal Swab     Status: None   Collection Time: 02/14/20  4:05 PM   Specimen: Nasopharyngeal Swab  Result Value Ref Range Status   SARS Coronavirus 2 NEGATIVE NEGATIVE Final    Comment: (NOTE) SARS-CoV-2 target nucleic acids are NOT DETECTED.  The SARS-CoV-2 RNA is generally detectable in upper and lower respiratory specimens during the acute phase of infection. The lowest concentration of SARS-CoV-2 viral copies this assay can detect is 250 copies / mL. A negative result does not preclude SARS-CoV-2 infection and should not be used as the sole basis for  treatment or other patient management decisions.  A negative result may occur with improper specimen collection / handling, submission of specimen other than nasopharyngeal swab, presence of viral mutation(s) within the areas targeted by this assay, and inadequate number of viral copies (<250 copies / mL). A negative result must be combined with clinical observations, patient history, and epidemiological information.  Fact Sheet for Patients:   StrictlyIdeas.no  Fact Sheet for Healthcare Providers: BankingDealers.co.za  This test is not yet approved or  cleared by the [place] FDA and has been authorized for detection and/or diagnosis of SARS-CoV-2 by FDA under an Emergency Use Authorization (EUA).  This EUA will remain in effect (meaning this test can be used) for the duration of the COVID-19 declaration under Section 564(b)(1) of the Act, 21 U.S.C. section 360bbb-3(b)(1), unless the authorization is terminated or revoked sooner.  Performed at [facility], [place] [address], [place], [place] [address]     Coagulation Studies: No results for input(s): LABPROT, INR in the last 72 hours.  Urinalysis: No results for input(s): COLORURINE, LABSPEC, PHURINE, GLUCOSEU, HGBUR, BILIRUBINUR, KETONESUR, PROTEINUR, UROBILINOGEN, NITRITE, LEUKOCYTESUR in the last 72 hours.  Invalid input(s): APPERANCEUR    Imaging: No results found.   Medications:    . (feeding supplement) PROSource Plus  30 mL Oral BID BM  . amLODipine  10 mg Oral Daily  . atorvastatin  20 mg Oral Daily  . baclofen  10 mg Oral QID  . carvedilol  3.125 mg Oral BID WC  . dexamethasone  4 mg Oral Q8H  . enoxaparin (LOVENOX) injection  40 mg Subcutaneous Q24H  . insulin aspart  0-9 Units Subcutaneous TID WC  . insulin aspart  3 Units Subcutaneous TID WC  . insulin glargine  14 Units Subcutaneous QHS  . lidocaine  1 patch Transdermal Q24H  . pantoprazole  40  mg Oral Daily  . polyethylene glycol  17 g Oral Daily  . Ensure Max Protein  11 oz Oral BID BM  . senna-docusate  2 tablet Oral Q lunch  . sodium chloride flush  10-40 mL Intracatheter Q12H   acetaminophen, alum & mag hydroxide-simeth, bisacodyl, chlorproMAZINE, clonazepam, diphenhydrAMINE, guaiFENesin-dextromethorphan, lidocaine, lidocaine, oxyCODONE, phenol, polyethylene glycol, prochlorperazine **OR** prochlorperazine **OR** prochlorperazine, simethicone, sodium chloride flush, traMADol, traZODone  Assessment/ Plan:  1.Acute kidney injury due to multiple myeloma and hypercalcemia causing prerenal status.  Serum creatinine stable.  2.Hyponatremia likely pseudohyponatremia due to paraproteinemia.  Sodium appears to be stable.  Improving.  3.Newly  diagnosed IgG kappa multiple myeloma with skeletal involvement and cord compression: Seen by oncology.  Treated with radiation therapy for cord compression.  On a steroid.  Plan for chemotherapy as outpatient per oncology.  4.Hypercalcemia due to MM with lytic bone lesion: Received pamidronate and calcitonin in the past.   Treated with a dose of zoledronic acid on 8/27, continue IVF.  Calcium level improved.  5.Hypertension: Blood pressure acceptable.  Continue amlodipine, carvedilol.  6.Hyperkalemia: On Lokelma.  Potassium level acceptable.  We will discontinue Lokelma at this point.  Will sign off patient at this point please reconsult if needed thank you very much for most interesting consult 03/08/2020   LOS: [place] _0 _1 :57 AM

## 2020-03-08 NOTE — Patient Care Conference (Signed)
Inpatient RehabilitationTeam Conference and Plan of Care Update Date: 03/08/2020   Time: 11:02 AM    Patient Name: Trevor Shelton      Medical Record Number: [id number]  Date of Birth: [birth year] Sex: Male         Room/Bed: 6U44I/3K74Q-59 Payor Info: Payor: /    Admit Date/Time:  03/03/2020 12:26 PM  Primary Diagnosis:  Incomplete paraplegia [facility])  Hospital Problems: Principal Problem:   Incomplete paraplegia [facility]) Active Problems:   Cord compression [facility])    Expected Discharge Date: Expected Discharge Date: 03/23/20  Team Members Present: Physician leading conference: Dr. Courtney Heys Care Coodinator Present: Loralee Pacas, LCSWA;Meliss Fleek Creig Hines, RN, BSN, [place] Nurse Present: Judee Clara, LPN PT Present: Stacy Gardner, PT OT Present: Willeen Cass, OT;Roanna Epley, COTA PPS Coordinator present : Ileana Ladd, Burna Mortimer, SLP     Current Status/Progress Goal Weekly Team Focus  Bowel/Bladder   Pt is continent of bowel/bladder.  To remain continent.  Assess tolieting needs often or as needed.   Swallow/Nutrition/ Hydration             ADL's   UB bathing/dressing-supervision; LB bathing-max A; LB dressing-tot A with sit<>stand in [place]; SB transfers with mod A;  min A overall  bed mobility; functional transfers, BADL retraining, education; activity tolerance   Mobility   min A rolling, mod to max supine to/from sit, mod A SB transfer vs mod/max to stand to stedy, Supervision w/c mobility  min A overall at w/c level  endurance, standing as able, LE NMR   Communication             Safety/Cognition/ Behavioral Observations            Pain   Complains of some rib cage pain on bilateral sides. 5-6/10 but has not requested pain meds during shift so far but does have PRNS if needed.  To bring pain level 2/10.  Assess pain q shift or prn.   Skin   Has incision to lower back w/ attached edges, and ecchymosis to bilateral sides of abdomen.  Promote healing and preventing  any breakdown from occurring.  Assess skin q shift or prn.     Discharge Planning:  Pt is uninsured. D/c to home with support from his wife and dtr.   Team Discussion: Continent B/B, PVR's have been zero. Complains of rib pain, medicating appropriately. Compliant with nursing and therapy. Slideboard transfers are min assist. Mod assist for bed mobility. Unsupported sitting requires assistance. Patient has min assist goals. Patient on target to meet rehab goals: yes  *See Care Plan and progress notes for long and short-term goals.   Revisions to Treatment Plan:  MD is ordering Left prafo boot.  Teaching Needs: Continue family education  Current Barriers to Discharge: Decreased caregiver support, Home enviroment access/layout, Weight bearing restrictions, Pending chemo/radiation and pain management, lack of insurance.  Possible Resolutions to Barriers: Provide continuing education to wife, Continue providing training on weight bearing restrictions, continue to work an locating a W/C for discharge due to lack of insurance, continue medication regimen.     Medical Summary Current Status: chest/rib pain- voiding/BM today-Cr down to 1.25; Na 127- due to high protein;  Barriers to Discharge: Decreased family/caregiver support;Other (comments);Medical stability;Pending chemo/radiation;Weight bearing restrictions;Home enviroment access/layout  Barriers to Discharge Comments: langauge; paraplegia; insurance  d/c 9/15 Possible Resolutions to [employer] Focus: SB transfers- working on [employer]; strength hasn't improved so far a lot- will need w/c at d/c, etc.   Continued Need for  Acute Rehabilitation Level of Care: The patient requires daily medical management by a physician with specialized training in physical medicine and rehabilitation for the following reasons: Direction of a multidisciplinary physical rehabilitation program to maximize functional independence : Yes Medical  management of patient stability for increased activity during participation in an intensive rehabilitation regime.: Yes Analysis of laboratory values and/or radiology reports with any subsequent need for medication adjustment and/or medical intervention. : Yes   I attest that I was present, lead the team conference, and concur with the assessment and plan of the team.   Cristi Loron 03/08/2020, 2:17 PM

## 2020-03-08 NOTE — Progress Notes (Signed)
Patient ID: Trevor Shelton, male   DOB: June 06, 1966, 54 y.o.   MRN: [id number]  SW met with pt and pt wife Verdis Frederickson in room with [employer]preter [phone] to provide updates from team conference, and d/c date 03/23/2020.SW informed on updates with regard to [facility] application pending at this time. SW continued to encourage family to begin to look for some DME items. SW reviewed charity options again with regard to DME, and [facility] medication assistance program. SW informed there will continue to be follow-up on d/c recommendations.   Loralee Pacas, MSW, [place] Office: [phone] Cell: [phone] Fax: [phone]

## 2020-03-08 NOTE — Progress Notes (Addendum)
[place] PHYSICAL MEDICINE & REHABILITATION PROGRESS NOTE   Subjective/Complaints:   Pt reports still having rib pain- due to multiple myeloma pain-lytic lesions.   Had to explain that pain would not go away right now- will take months to improve.   Wants to ask about dx and prognosis but asked him to wait until wife here and will do at 1pm today.    ROS:  Pt denies SOB, abd pain, CP, N/V/C/D, and vision changes    Objective:   No results found. Recent Labs    03/07/20 0528  WBC 4.3  HGB 8.8*  HCT 25.6*  PLT 79*   Recent Labs    03/07/20 0528 03/08/20 0620  NA 127* 127*  K 4.6 4.5  CL 98 96*  CO2 21* 22  GLUCOSE 214* 217*  BUN 50* 45*  CREATININE 1.37* 1.25*  CALCIUM 9.7 9.3    Intake/Output Summary (Last 24 hours) at 03/08/2020 0908 Last data filed at 03/08/2020 0737 Gross per 24 hour  Intake 830 ml  Output 2450 ml  Net -1620 ml     Physical Exam: Vital Signs Blood pressure 125/71, pulse 76, temperature 98 F (36.7 C), temperature source Oral, resp. rate 16, height 5\' 3"  (1.6 m), weight 62.2 kg, SpO2 97 %. General: sitting up in bed- OT in room; using interpretor, NAD HEENT: conjugate gaze Heart: RRR Chest: CTA B/L- no W/R/R- good air movement- TTP over ribs Abdomen: Soft, NT, ND, (+)BS    Musculoskeletal:     Cervical back: Neck supple. No rigidity.     Comments: UEs 5/5 in deltoid, biceps, triceps, WE, grip and finger abd B/L- pain with biceps testing due to rib fx's LEs- RLE- HF 3-/5, KE 3+/5, DF 4-/5, PF 5-/5 LLE- HF 2.5, KE 3/5, DF 2/5, PF 5-/5  Skin:    General: Skin is warm and dry.  Large purple bruise on sacrum- a tiny puncture mark under it- healing well B/L knee brown squishy bruises- improving/healing No LE edema anymore 2 radiation tags- chest and R side  Neurological: Ox3.   Light touch intact in all 4 extremities B/L Psychiatric:   more appropriate- don't see hiccups this AM    Assessment/Plan: 1. 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  Physiatrist is providing close team supervision and 24 hour management of active medical problems listed below.  Physiatrist and rehab team continue to assess barriers to discharge/monitor patient progress toward functional and medical goals  Care Tool:  Bathing    Body parts bathed by patient: Right arm, Left arm, Chest, Abdomen, Front perineal area, Right upper leg, Left upper leg, Face   Body parts bathed by helper: Buttocks, Right lower leg, Left lower leg     Bathing assist Assist Level: Moderate Assistance - Patient 50 - 74%     Upper Body Dressing/Undressing Upper body dressing   What is the patient wearing?: Pull over shirt    Upper body assist Assist Level: Supervision/Verbal cueing    Lower Body Dressing/Undressing Lower body dressing      What is the patient wearing?: Pants     Lower body assist Assist for lower body dressing: Maximal Assistance - Patient 25 - 49%     Toileting Toileting    Toileting assist Assist for toileting: Maximal Assistance - Patient 25 - 49% Assistive Device Comment: urinal   Transfers  Chair/bed transfer  Transfers assist     Chair/bed transfer assist level: Moderate Assistance - Patient 50 - 74%     Locomotion Ambulation   Ambulation assist   Ambulation activity did not occur: Safety/medical concerns          Walk 10 feet activity   Assist  Walk 10 feet activity did not occur: Safety/medical concerns        Walk 50 feet activity   Assist Walk 50 feet with 2 turns activity did not occur: Safety/medical concerns         Walk 150 feet activity   Assist Walk 150 feet activity did not occur: Safety/medical concerns         Walk 10 feet on uneven surface  activity   Assist Walk 10 feet on uneven surfaces  activity did not occur: Safety/medical concerns         Wheelchair     Assist Will patient use wheelchair at discharge?: Yes Type of Wheelchair: Manual    Wheelchair assist level: Supervision/Verbal cueing Max wheelchair distance: 150'    Wheelchair 50 feet with 2 turns activity    Assist        Assist Level: Supervision/Verbal cueing   Wheelchair 150 feet activity     Assist      Assist Level: Supervision/Verbal cueing   Blood pressure 125/71, pulse 76, temperature 98 F (36.7 C), temperature source Oral, resp. rate 16, height _0  (1.6 m), weight 62.2 kg, SpO2 97 %.   Medical Problem List and Plan: 1.  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  Antithrombotics: -DVT/anticoagulation:  Pharmaceutical: Lovenox   8/27- at high risk for DVT due to SCI and cancer being treated             --BLE dopplers negative for DVT             -antiplatelet therapy: N/a 3. Pain Management: Continue oxycodone --increase to 10 mg q 4 hrs prn--d/c IV dilaudid.  8/29 pain is stable  8/30- pt said not asking regularly for pain meds 4. Mood: LCSW to follow for evaluation and support.              -antipsychotic agents: N/a 5. Neuropsych: This patient is capable of making decisions on his own behalf. 6. Skin/Wound Care: Routine pressure relief measures.  7. Fluids/Electrolytes/Nutrition: Strict I/O. Check lytes in am.  8. Acute renal failure: Improving 81/4.92-->62/1.49  8/30- Cr 1.37 and BUN 50  8/31- Cr 1.25 and Bun down to 45- much better 9. Hyponatremia: Recurrent hyponatremia. Improving to 124 on 8/28.  8/30- Na 127 today  10. New diagnosis T2DM: Hgb A1c-6.5. BS poorly controlled due to steriods as well as Ensure tid between meals. Will change ensure to Ensure Max --continue Lantus  10 units at bedtime with 3 units meal coverage tid as well as SSI for elevated BS.    CBG (last 3)  Recent Labs    03/07/20 1625 03/07/20 2052 03/08/20 0559  GLUCAP 215* 244* 208*    8/28: Increase Lantus to 11U. Provided dietary counseling. Ordered dietary consult.   8/29: uncontrolled. Increase Lantus to 12U  8/30- Increase Lantus to 14 units  8/31-  will wait 1 day to let Lantus work- got last night for first increase dose 11. Constipation: Continue miralax am/change senna to lunch. KUB done showing ileus?  Wife/patient reports lot of gas and that he has been having BM daily. Pt says as well, BM daily.   8/27- adynamic ileus yesterday- will make sure pt has BM and recheck KUB in AM  8/29: KUB reviewed and shows moderate stool burden- he is having BM now 12. Mild BPH: will monitor voiding with PVR checks.  13. 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 Pathologic Fx T6 and T9:  Treated with XRT 10/10 and high dose steroids.  15. L foot drop- get prevalon boot on L at night  16. Hyponatremia/Hyperkalemia  8/27- Na 122 this AM- will call Nephrology, esp because K+ 4.8 and was on Lokelma BID- changed to daily and held dose this AM.  8/3-- can stop Lokelma per nephrology and Na up to 127- doing better-   17. Hiccups- for 1 week  8/27- on Thorazine- will add baclofen 10 mg TID prn for hiccups  8/30- will change to QID and schedule it- also asked nursing to give Thorazine when notices hiccups, if due- to try and help.  18. Thrombocytopenia  8/30- plts dropping- are 79k- if drop again by Wednesday, will need to see what else is possible other than Lovenox- will check with Heme/Onc.   8/31- labs in AM 18. Dispo  8/30- pt/family want to have discussion Tuesday 1-1:30 pm to discuss medical issues- will try and do this.     I spent a total of 40 minutes on total care today- an additional 25 minutes was spent d/w family and pt about prognosis and dx.   LOS: 5 days A FACE TO FACE  EVALUATION WAS PERFORMED  Merrel Crabbe 03/08/2020, 9:08 AM

## 2020-03-08 NOTE — Progress Notes (Signed)
Physical Therapy Session Note  Patient Details  Name: Trevor Shelton MRN: [id number] Date of Birth: [birth year]  Today's Date: 03/08/2020 PT Individual Time: 1400-1515 PT Individual Time Calculation (min): 75 min   Short Term Goals: Week 1:  PT Short Term Goal 1 (Week 1): pt to demonstrate dynamic sitting balance at CGA for 59mins PT Short Term Goal 2 (Week 1): pt to demonstrate min A x1 for rolling consistently PT Short Term Goal 3 (Week 1): pt to demonstrate transfers to/from [facility] mod A with LRAD PT Short Term Goal 4 (Week 1): pt to self propel WC for 100' CGA with setup for parts  Skilled Therapeutic Interventions/Progress Updates:    Pt received seated in w/c in room, agreeable to PT session. Pt reports ongoing pain in ribs, not rated and declines intervention. Manual w/c propulsion x 200 ft at Supervision level with use of BUE and cues for proper propulsion technique. Slide board transfer w/c to mat table with min A. Seated BLE coordination and strengthening tasks: alt L/R cone kicks, progression to cone kicks with 4# ankle weight for improved proprioception; alt L/R cone taps with progression to cone taps with 4# ankle weight for improved proprioception and increased challenge of task. LLE exhibits decreased coordination as compared to RLE as well as fatigues quicker. Seated balance reaching outside BOS and across midline reaching for horseshoes with close SBA to CGA for sitting balance. Pt then able to perform horseshoe toss with use of RUE with SBA for sitting balance. Seated mini-crunches 3 x 15 reps to fatigue with focus on decreased use of UE to pull himself back up to upright sitting. Pt requires min A for core control. Slide board transfer back to w/c. Pt then begins having hiccups/burping and some emesis. Nursing notified. Pt left seated in w/c in room with needs in reach, quick release belt and chair alarm in place at end of session, nursing in room.  Therapy Documentation Precautions:   Precautions Precautions: Back, Fall (per chart review, no specific back precautions however encouraged per pt comfort) Precaution Comments: Significant BLE weakness and ataxia-like movements (LLE>RLE); back and rib precautions for comfort due to fxs Restrictions Weight Bearing Restrictions: No    Therapy/Group: Individual Therapy   Excell Seltzer, PT, DPT  03/08/2020, 5:13 PM

## 2020-03-09 ENCOUNTER — Inpatient Hospital Stay (HOSPITAL_COMMUNITY): Payer: Self-pay | Admitting: Physical Therapy

## 2020-03-09 ENCOUNTER — Ambulatory Visit (HOSPITAL_COMMUNITY): Payer: Self-pay | Admitting: Occupational Therapy

## 2020-03-09 LAB — CBC WITH DIFFERENTIAL/PLATELET
Abs Immature Granulocytes: 0.3 10*3/uL — ABNORMAL HIGH (ref 0.00–0.07)
Basophils Absolute: 0 10*3/uL (ref 0.0–0.1)
Basophils Relative: 0 %
Eosinophils Absolute: 0 10*3/uL (ref 0.0–0.5)
Eosinophils Relative: 0 %
HCT: 30.1 % — ABNORMAL LOW (ref 39.0–52.0)
Hemoglobin: 10.3 g/dL — ABNORMAL LOW (ref 13.0–17.0)
Immature Granulocytes: 4 %
Lymphocytes Relative: 6 %
Lymphs Abs: 0.3 10*3/uL — ABNORMAL LOW (ref 0.7–4.0)
MCH: 28.6 pg (ref 26.0–34.0)
MCHC: 34.2 g/dL (ref 30.0–36.0)
MCV: 83.6 fL (ref 80.0–100.0)
Monocytes Absolute: 0.2 10*3/uL (ref 0.1–1.0)
Monocytes Relative: 4 %
Neutro Abs: 4.2 10*3/uL (ref 1.7–7.7)
Neutrophils Relative %: 86 %
Platelets: 92 10*3/uL — ABNORMAL LOW (ref 150–400)
RBC: 3.6 MIL/uL — ABNORMAL LOW (ref 4.22–5.81)
RDW: 11.8 % (ref 11.5–15.5)
WBC: 5 10*3/uL (ref 4.0–10.5)
nRBC: 0 % (ref 0.0–0.2)

## 2020-03-09 LAB — BASIC METABOLIC PANEL
Anion gap: 10 (ref 5–15)
BUN: 43 mg/dL — ABNORMAL HIGH (ref 6–20)
CO2: 19 mmol/L — ABNORMAL LOW (ref 22–32)
Calcium: 9 mg/dL (ref 8.9–10.3)
Chloride: 95 mmol/L — ABNORMAL LOW (ref 98–111)
Creatinine, Ser: 1.16 mg/dL (ref 0.61–1.24)
GFR calc Af Amer: >60 mL/min (ref >60)
GFR calc non Af Amer: >60 mL/min (ref >60)
Glucose, Bld: 263 mg/dL — ABNORMAL HIGH (ref 70–99)
Potassium: 4.4 mmol/L (ref 3.5–5.1)
Sodium: 124 mmol/L — ABNORMAL LOW (ref 135–145)

## 2020-03-09 LAB — GLUCOSE, CAPILLARY
Glucose-Capillary: 209 mg/dL — ABNORMAL HIGH (ref 70–99)
Glucose-Capillary: 256 mg/dL — ABNORMAL HIGH (ref 70–99)
Glucose-Capillary: 283 mg/dL — ABNORMAL HIGH (ref 70–99)
Glucose-Capillary: 291 mg/dL — ABNORMAL HIGH (ref 70–99)

## 2020-03-09 MED ORDER — WHITE PETROLATUM EX OINT
TOPICAL_OINTMENT | CUTANEOUS | Status: DC | PRN
  Filled 2020-03-09: qty 28.35

## 2020-03-09 MED ORDER — INSULIN GLARGINE 100 UNIT/ML ~~LOC~~ SOLN
16.0000 [IU] | Freq: Every day | SUBCUTANEOUS | Status: DC
  Administered 2020-03-09: 16 [IU] via SUBCUTANEOUS
  Filled 2020-03-09 (×2): qty 0.16

## 2020-03-09 NOTE — Progress Notes (Addendum)
[place] PHYSICAL MEDICINE & REHABILITATION PROGRESS NOTE   Subjective/Complaints:  Pt reports still having rib pain- also c/o a little dizziness/lightheaded- not orthostatic per RN and OT on exam.   Is mild to moderate.  Explained rib pain is due to multiple myeloma.     ROS:  Pt denies SOB, abd pain, CP, N/V/C/D, and vision changes    Objective:   No results found. Recent Labs    03/07/20 0528 03/09/20 0735  WBC 4.3 5.0  HGB 8.8* 10.3*  HCT 25.6* 30.1*  PLT 79* 92*   Recent Labs    03/08/20 0620 03/09/20 0735  NA 127* 124*  K 4.5 4.4  CL 96* 95*  CO2 22 19*  GLUCOSE 217* 263*  BUN 45* 43*  CREATININE 1.25* 1.16  CALCIUM 9.3 9.0    Intake/Output Summary (Last 24 hours) at 03/09/2020 0958 Last data filed at 03/09/2020 0730 Gross per 24 hour  Intake 828 ml  Output 1500 ml  Net -672 ml     Physical Exam: Vital Signs Blood pressure 125/75, pulse 77, temperature 98.4 F (36.9 C), resp. rate 18, height 5' 3" (1.6 m), weight 60.9 kg, SpO2 98 %. General: sitting up in bed- OT and RN in room; NAD- interpretor in room today-  HEENT: conjugate gaze Heart: RRR Chest: CTA B/L- no W/R/R- good air movement TTP over ribs Abdomen: Soft, NT, ND, (+)BS    Musculoskeletal:     Cervical back: Neck supple. No rigidity.     Comments: UEs 5/5 in deltoid, biceps, triceps, WE, grip and finger abd B/L- pain with biceps testing due to rib fx's LEs- RLE- HF 3-/5, KE 3+/5, DF 4-/5, PF 5-/5 LLE- HF 2.5, KE 3/5, DF 2/5, PF 5-/5  Skin:    General: Skin is warm and dry.  Large purple bruise on sacrum- a tiny puncture mark under it- healing well B/L knee brown squishy bruises- improving/healing No LE edema anymore 2 radiation tags- chest and R side  Neurological: Ox3.   Light touch intact in all 4 extremities B/L Psychiatric:  appropriate this AM    Assessment/Plan: 1. Functional deficits secondary to Incomplete paraplegia from multiple myeloma and cord compression at T6  which require 3+ hours per day of interdisciplinary therapy in a comprehensive inpatient rehab setting.  Physiatrist is providing close team supervision and 24 hour management of active medical problems listed below.  Physiatrist and rehab team continue to assess barriers to discharge/monitor patient progress toward functional and medical goals  Care Tool:  Bathing    Body parts bathed by patient: Right arm, Left arm, Chest, Abdomen, Front perineal area, Right upper leg, Left upper leg, Right lower leg, Left lower leg, Face   Body parts bathed by helper: Buttocks     Bathing assist Assist Level: Moderate Assistance - Patient 50 - 74%     Upper Body Dressing/Undressing Upper body dressing   What is the patient wearing?: Pull over shirt    Upper body assist Assist Level: Supervision/Verbal cueing    Lower Body Dressing/Undressing Lower body dressing      What is the patient wearing?: Pants     Lower body assist Assist for lower body dressing: Moderate Assistance - Patient 50 - 74%     Toileting Toileting    Toileting assist Assist for toileting: Maximal Assistance - Patient 25 - 49% Assistive Device Comment: urinal   Transfers Chair/bed transfer  Transfers assist     Chair/bed transfer assist level: Minimal Assistance -   Patient > 75%     Locomotion Ambulation   Ambulation assist   Ambulation activity did not occur: Safety/medical concerns          Walk 10 feet activity   Assist  Walk 10 feet activity did not occur: Safety/medical concerns        Walk 50 feet activity   Assist Walk 50 feet with 2 turns activity did not occur: Safety/medical concerns         Walk 150 feet activity   Assist Walk 150 feet activity did not occur: Safety/medical concerns         Walk 10 feet on uneven surface  activity   Assist Walk 10 feet on uneven surfaces activity did not occur: Safety/medical concerns         Wheelchair     Assist  Will patient use wheelchair at discharge?: Yes Type of Wheelchair: Manual    Wheelchair assist level: Supervision/Verbal cueing Max wheelchair distance: 150'    Wheelchair 50 feet with 2 turns activity    Assist        Assist Level: Supervision/Verbal cueing   Wheelchair 150 feet activity     Assist      Assist Level: Supervision/Verbal cueing   Blood pressure 125/75, pulse 77, temperature 98.4 F (36.9 C), resp. rate 18, height 5' 3" (1.6 m), weight 60.9 kg, SpO2 98 %.   Medical Problem List and Plan: 1.  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  Antithrombotics: -DVT/anticoagulation:  Pharmaceutical: Lovenox   8/27- at high risk for DVT due to SCI and cancer being treated             --BLE dopplers negative for DVT             -antiplatelet therapy: N/a 3. Pain Management: Continue oxycodone --increase to 10 mg q 4 hrs prn--d/c IV dilaudid.  8/29 pain is stable  8/30- pt said not asking regularly for pain meds  9/1- pt still having pain- encouraged to take meds if need be. Explained why he still having pain.  4. Mood: LCSW to follow for evaluation and support.              -antipsychotic agents: N/a 5. Neuropsych: This patient is capable of making decisions on his own behalf. 6. Skin/Wound Care: Routine pressure relief measures.  7. Fluids/Electrolytes/Nutrition: Strict I/O. Check lytes in am.  8. Acute renal failure: Improving 81/4.92-->62/1.49  8/30- Cr 1.37 and BUN 50  8/31- Cr 1.25 and Bun down to 45- much better  9/1- Cr 1.17 9. Hyponatremia: Recurrent hyponatremia. Improving to 124 on 8/28.  8/30- Na 127 today  9/1- Na 124- stable/OK per Nephrology due to high protein.   10. New diagnosis T2DM: Hgb A1c-6.5. BS poorly controlled due to steriods as well as Ensure tid  between meals. Will change ensure to Ensure Max --continue Lantus 10 units at bedtime with 3 units meal coverage tid as well as SSI for elevated BS.    CBG (last 3)  Recent Labs    03/08/20 1656 03/08/20 2105 03/09/20 0639  GLUCAP 220* 219* 209*    8/28: Increase Lantus to 11U. Provided dietary counseling. Ordered dietary consult.     8/29: uncontrolled. Increase Lantus to 12U  8/30- Increase Lantus to 14 units  8/31- will wait 1 day to let Lantus work- got last night for first increase dose  9/1- will increase Lantus to 16 units due to elevated BGs 11. Constipation: Continue miralax am/change senna to lunch. KUB done showing ileus?  Wife/patient reports lot of gas and that he has been having BM daily. Pt says as well, BM daily.   8/27- adynamic ileus yesterday- will make sure pt has BM and recheck KUB in AM  8/29: KUB reviewed and shows moderate stool burden- he is having BM now 12. Mild BPH: will monitor voiding with PVR checks.  13. 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 Pathologic Fx T6 and T9:  Treated with XRT 10/10 and high dose steroids.  15. L foot drop- get prevalon boot on L at night  9/1- told wasn't in room- will order another PRAFO  16. Hyponatremia/Hyperkalemia  8/27- Na 122 this AM- will call Nephrology, esp because K+ 4.8 and was on Lokelma BID- changed to daily and held dose this AM.  8/3-- can stop Lokelma per nephrology and Na up to 127- doing better-   17. Hiccups- for 1 week  8/27- on Thorazine- will add baclofen 10 mg TID prn for hiccups  8/30- will change to QID and schedule it- also asked nursing to give Thorazine when notices hiccups, if due- to try and help.  18. Thrombocytopenia  8/30- plts dropping- are 79k- if drop again by Wednesday, will need to see what else is possible other than Lovenox- will check with Heme/Onc.   8/31- labs in AM  9/1- Plts up to 92- doing better- will check 2x/week.  18. Dispo  8/30- pt/family want to have discussion  Tuesday 1-1:30 pm to discuss medical issues- will try and do this.   9/1- no interpretor there- will do Thursday 19. Dizziness  9/1- not orthostatic- will encourage to push fluids and if doesn't fix, will order abd binder   I spent a total of 40 minutes on total care today- an additional 25 minutes was spent d/w family and pt about prognosis and dx.   LOS: 6 days A FACE TO FACE EVALUATION WAS PERFORMED    03/09/2020, 9:58 AM     

## 2020-03-09 NOTE — Plan of Care (Signed)
  Problem: Consults Goal: RH SPINAL CORD INJURY PATIENT EDUCATION Description:  See Patient Education module for education specifics.  Outcome: Progressing Goal: Skin Care Protocol Initiated - if Braden Score 18 or less Description: If consults are not indicated, leave blank or document N/A Outcome: Progressing Goal: Diabetes Guidelines if Diabetic/Glucose > 140 Description: If diabetic or lab glucose is > 140 mg/dl - Initiate Diabetes/Hyperglycemia Guidelines & Document Interventions  Outcome: Progressing   Problem: SCI BOWEL ELIMINATION Goal: RH STG MANAGE BOWEL WITH ASSISTANCE Description: STG Manage Bowel with mod Assistance. Outcome: Progressing Goal: RH STG MANAGE BOWEL W/EQUIPMENT W/ASSISTANCE Description: STG Manage Bowel With Equipment With mod Assistance Outcome: Progressing Goal: RH STG SCI MANAGE BOWEL PROGRAM W/ASSIST OR AS APPROPRIATE Description: STG SCI Manage bowel program w/mo assist or as appropriate. Outcome: Progressing   Problem: SCI BLADDER ELIMINATION Goal: RH STG MANAGE BLADDER WITH ASSISTANCE Description: STG Manage Bladder With min Assistance Outcome: Progressing Goal: RH STG MANAGE BLADDER WITH MEDICATION WITH ASSISTANCE Description: STG Manage Bladder With Medication With min Assistance. Outcome: Progressing   Problem: RH SKIN INTEGRITY Goal: RH STG SKIN FREE OF INFECTION/BREAKDOWN Description: Skin will be free of infection/breakdown with min assist Outcome: Progressing Goal: RH STG MAINTAIN SKIN INTEGRITY WITH ASSISTANCE Description: STG Maintain Skin Integrity With min Assistance. Outcome: Progressing   Problem: RH SAFETY Goal: RH STG ADHERE TO SAFETY PRECAUTIONS W/ASSISTANCE/DEVICE Description: STG Adhere to Safety Precautions With min Assistance/Device. Outcome: Progressing   Problem: RH PAIN MANAGEMENT Goal: RH STG PAIN MANAGED AT OR BELOW PT'S PAIN GOAL Description: Pain will be managed at or below 2 out of 10 with min assist Outcome:  Progressing   Problem: RH KNOWLEDGE DEFICIT SCI Goal: RH STG INCREASE KNOWLEDGE OF SELF CARE AFTER SCI Outcome: Progressing

## 2020-03-09 NOTE — Progress Notes (Signed)
Physical Therapy Session Note  Patient Details  Name: Trevor Shelton MRN: [id number] Date of Birth: [birth year]  Today's Date: 03/09/2020 PT Individual Time: 1000-1100; 1300-1400 PT Individual Time Calculation (min): 60 min and 60 min  Short Term Goals: Week 1:  PT Short Term Goal 1 (Week 1): pt to demonstrate dynamic sitting balance at CGA for 27mins PT Short Term Goal 2 (Week 1): pt to demonstrate min A x1 for rolling consistently PT Short Term Goal 3 (Week 1): pt to demonstrate transfers to/from [facility] mod A with LRAD PT Short Term Goal 4 (Week 1): pt to self propel WC for 100' CGA with setup for parts  Skilled Therapeutic Interventions/Progress Updates:    Session 1: Pt received seated in w/c in room, agreeable to PT session. Pt reports ongoing nausea and dizziness. Seated BP 127/70 and RN notified of pt's nausea and able to provide medication during session. Pt also reports ongoing rib pain with mobility as well as onset of R shoulder pain. Addressed pain via repositioning this session. Slide board transfer w/c to mat table with min A. Set pt up with ultra lightweight manual w/c for decreased shoulder strain and decreased energy expenditure with mobility. Slide board transfer mat table to w/c with min A. Pt does well with w/c propulsion with ultra lightweight manual w/c up to 200 ft with use of BUE at Supervision level. Reviewed hand placement on w/c rims and provided w/c gloves for improved grip. Pt exhibits increased propulsion speed and decreased number of strokes required to cover distances with use of ultra lightweight manual w/c. Remainder of session focused on BLE in standing via standing frame. Pt able to tolerate 2 bouts of standing in frame x 5 min each. While in standing decreased assist from sling and pt able to come from perched position to full upright stance with focus on use of LE>UE. Pt able to perform about 2x10 reps of mini-stands before onset of fatigue and requires a seated rest break  in w/c. Pt requests to return to bed at end of session due to fatigue. Slide board transfer back to bed with mod A due to going uphill. Sit to supine mod A for BLE management. Pt left semi-reclined in bed with needs in reach, bed alarm in place at end of session. Interpreter present during therapy session.  Session 2: Pt received seated in bed, agreeable to PT session. Pt reports symptoms similar to acid reflux, RN notified. Bed mobility min A. SB transfer to w/c with min A. Manual w/c propulsion 2 x 150 ft at Supervision level with use of BUE. Sit to stand to stedy with min A progressing to max A with onset of fatigue. Session focus on standing in stedy with use of mirror for visual feedback. Pt tends to stand in flexed hip and trunk posture, able to correct with visual feedback and mod manual and verbal cueing. Pt able to maintain full upright stance for about 30 sec before onset of fatigue. Pt requests to return to bed at end of session. SB transfer back to bed with mod A due to going uphill. Sit to supine min A for RLE management. Pt left semi-reclined in bed with needs in reach, bed alarm in place. Interpreter present during therapy session.  Therapy Documentation Precautions:  Precautions Precautions: Back, Fall (per chart review, no specific back precautions however encouraged per pt comfort) Precaution Comments: Significant BLE weakness and ataxia-like movements (LLE>RLE); back and rib precautions for comfort due to fxs Restrictions Weight  Bearing Restrictions: No    Therapy/Group: Individual Therapy   Excell Seltzer, PT, DPT  03/09/2020, 11:34 AM

## 2020-03-09 NOTE — Progress Notes (Signed)
Orthopedic Tech Progress Note Patient Details:  Alexandro Line [birth year] [id number] also called in order to [employer] for a WALKING PRAFO BOOT Ortho Devices Type of Ortho Device: Abdominal binder Ortho Device/Splint Location: stomach Ortho Device/Splint Interventions: Ordered, Application   Post Interventions Patient Tolerated: Well Instructions Provided: [facility] [place] 03/09/2020, 10:08 AM

## 2020-03-09 NOTE — Progress Notes (Signed)
Occupational Therapy Session Note  Patient Details  Name: Trevor Shelton MRN: [id number] Date of Birth: 06-23-1966  Today's Date: 03/09/2020 OT Individual Time: 0800-0900 OT Individual Time Calculation (min): 60 min    Short Term Goals: Week 1:  OT Short Term Goal 1 (Week 1): Pt will be able to sit to EOB with min A. OT Short Term Goal 2 (Week 1): Pt will complete slide board transfer with mod A to prepare for toilet transfers. OT Short Term Goal 3 (Week 1): Pt will be able to push to stand with a RW with max A. OT Short Term Goal 4 (Week 1): from bed level, pt will don pants with mod A.  Skilled Therapeutic Interventions/Progress Updates:    Pt resting in bed upon arrival with MD and interpreter present. OT intervention with focus on bed mobility, SB transfers, BADL retraining, sit<>stand, standing balance, and activity tolerance to increase independence with BADLs. Supine>sit EOB with supervision. SB transfer to w/c with min A.  Pt completed UB bathing/dressing seated in w/c at sink with supervision. Sit<>stand from w/c with mod A+2 (three musketeer). Standing balance at sink with mod A+2 (three musketeer) for peri hygiene and pulling pants over hips. Pt able to thread pants, don socks, son shoes, and fasten shoes while seated in w/c at sink. Pt c/o sporadic "dizziness" but unable to determine any particular activity/motion that elicited dizziness.  BP 133/79 after standing. Pt remained in w/c with belt alarm activated and all needs within reach.   Therapy Documentation Precautions:  Precautions Precautions: Back, Fall (per chart review, no specific back precautions however encouraged per pt comfort) Precaution Comments: Significant BLE weakness and ataxia-like movements (LLE>RLE); back and rib precautions for comfort due to fxs Restrictions Weight Bearing Restrictions: No Pain: Pt c/o increased R shoulder pain; RN aware  Therapy/Group: Individual Therapy  Leroy Libman 03/09/2020, 9:07 AM

## 2020-03-10 ENCOUNTER — Inpatient Hospital Stay (HOSPITAL_COMMUNITY): Payer: Self-pay

## 2020-03-10 ENCOUNTER — Inpatient Hospital Stay (HOSPITAL_COMMUNITY): Payer: Self-pay | Admitting: Physical Therapy

## 2020-03-10 ENCOUNTER — Ambulatory Visit (HOSPITAL_COMMUNITY): Payer: Self-pay | Admitting: *Deleted

## 2020-03-10 LAB — GLUCOSE, CAPILLARY
Glucose-Capillary: 209 mg/dL — ABNORMAL HIGH (ref 70–99)
Glucose-Capillary: 265 mg/dL — ABNORMAL HIGH (ref 70–99)
Glucose-Capillary: 285 mg/dL — ABNORMAL HIGH (ref 70–99)
Glucose-Capillary: 265 mg/dL — ABNORMAL HIGH (ref 70–99)

## 2020-03-10 MED ORDER — ONDANSETRON HCL 4 MG PO TABS
4.0000 mg | ORAL_TABLET | Freq: Four times a day (QID) | ORAL | Status: DC | PRN
  Administered 2020-03-13 – 2020-03-15 (×2): 4 mg via ORAL
  Filled 2020-03-10 (×2): qty 1

## 2020-03-10 MED ORDER — INSULIN GLARGINE 100 UNIT/ML ~~LOC~~ SOLN
18.0000 [IU] | Freq: Every day | SUBCUTANEOUS | Status: DC
  Administered 2020-03-10 – 2020-03-11 (×2): 18 [IU] via SUBCUTANEOUS
  Filled 2020-03-10 (×3): qty 0.18

## 2020-03-10 MED ORDER — TRAZODONE HCL 50 MG PO TABS
100.0000 mg | ORAL_TABLET | Freq: Every day | ORAL | Status: DC
  Administered 2020-03-10 – 2020-03-11 (×2): 100 mg via ORAL
  Filled 2020-03-10 (×2): qty 2

## 2020-03-10 MED ORDER — PANTOPRAZOLE SODIUM 40 MG PO TBEC
40.0000 mg | DELAYED_RELEASE_TABLET | Freq: Two times a day (BID) | ORAL | Status: DC
  Administered 2020-03-10 – 2020-03-19 (×19): 40 mg via ORAL
  Filled 2020-03-10 (×19): qty 1

## 2020-03-10 MED ORDER — LIDOCAINE 5 % EX PTCH
2.0000 | MEDICATED_PATCH | Freq: Once | CUTANEOUS | Status: AC
  Administered 2020-03-10: 2 via TRANSDERMAL
  Filled 2020-03-10: qty 2

## 2020-03-10 MED ORDER — LIDOCAINE 5 % EX PTCH
2.0000 | MEDICATED_PATCH | CUTANEOUS | Status: DC
  Administered 2020-03-11 – 2020-03-19 (×9): 2 via TRANSDERMAL
  Filled 2020-03-10 (×12): qty 2

## 2020-03-10 NOTE — Progress Notes (Signed)
Occupational Therapy Weekly Progress Note  Patient Details  Name: Trevor Shelton MRN: [id number] Date of Birth: [birth year]  Beginning of progress report period: March 04, 2020 End of progress report period: March 10, 2020  Patient has met 3 of 4 short term goals.  Pt is making steady progress with functional transfers and BADLs since admission.  SB transfers with min A after placement of SB. UB dressing at w/c level with supervision/setup. Pt is able to thread pants over BLE, don socks, and don/fasten shoes with CGA seated in w/c.  Sit<>stand from w/c with mod A+2 (three musketeers). Pt requires tot A for bathing buttocks and pulling pants over hips.   Patient continues to demonstrate the following deficits: muscle weakness, decreased cardiorespiratoy endurance, impaired timing and sequencing, abnormal tone, unbalanced muscle activation, ataxia, decreased coordination and decreased motor planning and decreased sitting balance, decreased standing balance, decreased postural control, decreased balance strategies and acute pain and therefore will continue to benefit from skilled OT intervention to enhance overall performance with BADL and Reduce care partner burden.  Patient progressing toward long term goals..  Continue plan of care.  OT Short Term Goals Week 1:  OT Short Term Goal 1 (Week 1): Pt will be able to sit to EOB with min A. OT Short Term Goal 1 - Progress (Week 1): Met OT Short Term Goal 2 (Week 1): Pt will complete slide board transfer with mod A to prepare for toilet transfers. OT Short Term Goal 2 - Progress (Week 1): Met OT Short Term Goal 3 (Week 1): Pt will be able to push to stand with a RW with max A. OT Short Term Goal 3 - Progress (Week 1): Progressing toward goal OT Short Term Goal 4 (Week 1): from bed level, pt will don pants with mod A. OT Short Term Goal 4 - Progress (Week 1): Met Week 2:  OT Short Term Goal 1 (Week 2): Pt will be able to push to stand with a RW with max  A. OT Short Term Goal 2 (Week 2): Pt will perform squat pivot transfers with mod A OT Short Term Goal 3 (Week 2): Pt will perform toileting tasks with mod A (2/3)        [name] [facility] 03/10/2020, 6:32 AM

## 2020-03-10 NOTE — Progress Notes (Signed)
Occupational Therapy Session Note  Patient Details  Name: Trevor Shelton MRN: [id number] Date of Birth: 03-30-1966  Today's Date: 03/10/2020 OT Individual Time: 0800-0900 OT Individual Time Calculation (min): 60 min    Short Term Goals: Week 2:  OT Short Term Goal 1 (Week 2): Pt will be able to push to stand with a RW with max A. OT Short Term Goal 2 (Week 2): Pt will perform squat pivot transfers with mod A OT Short Term Goal 3 (Week 2): Pt will perform toileting tasks with mod A (2/3)  Skilled Therapeutic Interventions/Progress Updates:    Pt resting in bed upon arrival with MD present.  Interpreter arrived.  OT intervention with focus on bed mobility, squat pivot transfers, BADL retraining, sit<>stand, standing balance, and activity tolerance to increase independence with BADLs. Supine>sit EOB with supervision.  Squat pivot transfer to w/c with max A. UB bathing/dressing at sink with supervision. Pt threads pants over BLE and dons socks/shoes with min A.  Mod A+2 for sit<>stand (three musketeers) and mod A+2 for standing balance.  Recreational Therapist assisted and was present during session for cotreatment. Pt commented that he didn't sleep well during the night and requested to return to bed at end of session.  SB transfer to bed with mod A (elevated surface). Pt reqiured mod A for bridging in bed to reposition. Pt remained in bed with all needs within reach and bed alarm activated.  Therapy Documentation Precautions:  Precautions Precautions: Back, Fall (per chart review, no specific back precautions however encouraged per pt comfort) Precaution Comments: Significant BLE weakness and ataxia-like movements (LLE>RLE); back and rib precautions for comfort due to fxs Restrictions Weight Bearing Restrictions: No   Pain: Pt c/o ongoing rib pain; MD aware  Therapy/Group: Individual Therapy  Leroy Libman 03/10/2020, 12:05 PM

## 2020-03-10 NOTE — Progress Notes (Signed)
[facility] PHYSICAL MEDICINE & REHABILITATION PROGRESS NOTE   Subjective/Complaints:  Pt reports having N/V- vomited 3x this morning/overnight- doesn't remember receiving anything for nausea. Will ask nursing.   Didn't sleep at all- and hasn't for 2-3 days.  Hiccups are better, but c/o reflux  - burning in esophagus- which could be due to chemo- Also c/o R shoulder pain- worse with lifting arm.     ROS:   Pt denies SOB, abd pain, CP,- (+) N/V No C/D, and vision changes   Objective:   No results found. Recent Labs    03/09/20 0735  WBC 5.0  HGB 10.3*  HCT 30.1*  PLT 92*   Recent Labs    03/08/20 0620 03/09/20 0735  NA 127* 124*  K 4.5 4.4  CL 96* 95*  CO2 22 19*  GLUCOSE 217* 263*  BUN 45* 43*  CREATININE 1.25* 1.16  CALCIUM 9.3 9.0    Intake/Output Summary (Last 24 hours) at 03/10/2020 0911 Last data filed at 03/10/2020 0445 Gross per 24 hour  Intake 600 ml  Output 1225 ml  Net -625 ml     Physical Exam: Vital Signs Blood pressure 119/81, pulse 79, temperature 98.2 F (36.8 C), resp. rate 18, height '5\' 3"'  (1.6 m), weight 60.9 kg, SpO2 98 %. General: sitting up in bed- using tele-interpretor, OT in room, NAD- appears nauseated - vomit bag right there HEENT: conjugate gaze Heart: RRR Chest: CTA B/L- no W/R/R- good air movement TTP over ribs Abdomen: soft, ND, TTP- and c/o epigastric/esophageal "burning"- (+)normoatice BS    Musculoskeletal:     Cervical back: Neck supple. No rigidity.     Comments: UEs 5/5 in deltoid, biceps, triceps, WE, grip and finger abd B/L- pain with biceps testing due to rib fx's LEs- RLE- HF 3-/5, KE 3+/5, DF 4-/5, PF 5-/5 LLE- HF 2.5, KE 3/5, DF 2/5, PF 5-/5  TTP over entire R shoulder- not specific to anterior/posterior or AC joint-  Skin:    General: Skin is warm and dry.  Large purple bruise on sacrum- a tiny puncture mark under it- healing well B/L knee brown squishy bruises- improving/healing No LE edema anymore 2  radiation tags- chest and R side  Neurological: Ox3.   Light touch intact in all 4 extremities B/L Psychiatric:  sadder affect this AM    Assessment/Plan: 1. 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  Physiatrist is providing close team supervision and 24 hour management of active medical problems listed below.  Physiatrist and rehab team continue to assess barriers to discharge/monitor patient progress toward functional and medical goals  Care Tool:  Bathing    Body parts bathed by patient: Right arm, Left arm, Chest, Abdomen, Front perineal area, Right upper leg, Left upper leg, Right lower leg, Left lower leg, Face   Body parts bathed by helper: Buttocks     Bathing assist Assist Level: Moderate Assistance - Patient 50 - 74%     Upper Body Dressing/Undressing Upper body dressing   What is the patient wearing?: Pull over shirt    Upper body assist Assist Level: Supervision/Verbal cueing    Lower Body Dressing/Undressing Lower body dressing      What is the patient wearing?: Pants     Lower body assist Assist for lower body dressing: Moderate Assistance - Patient 50 - 74%     Toileting  Toileting    Toileting assist Assist for toileting: Maximal Assistance - Patient 25 - 49% Assistive Device Comment: urinal   Transfers Chair/bed transfer  Transfers assist     Chair/bed transfer assist level: Moderate Assistance - Patient 50 - 74%     Locomotion Ambulation   Ambulation assist   Ambulation activity did not occur: Safety/medical concerns          Walk 10 feet activity   Assist  Walk 10 feet activity did not occur: Safety/medical concerns        Walk 50 feet activity   Assist Walk 50 feet with 2 turns activity did not occur: Safety/medical concerns         Walk 150 feet  activity   Assist Walk 150 feet activity did not occur: Safety/medical concerns         Walk 10 feet on uneven surface  activity   Assist Walk 10 feet on uneven surfaces activity did not occur: Safety/medical concerns         Wheelchair     Assist Will patient use wheelchair at discharge?: Yes Type of Wheelchair: Manual    Wheelchair assist level: Supervision/Verbal cueing Max wheelchair distance: 150'    Wheelchair 50 feet with 2 turns activity    Assist        Assist Level: Supervision/Verbal cueing   Wheelchair 150 feet activity     Assist      Assist Level: Supervision/Verbal cueing   Blood pressure 119/81, pulse 79, temperature 98.2 F (36.8 C), resp. rate 18, height '5\' 3"'  (1.6 m), weight 60.9 kg, SpO2 98 %.   Medical Problem List and Plan: 1.  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  Antithrombotics: -DVT/anticoagulation:  Pharmaceutical: Lovenox   8/27- at high risk for DVT due to SCI and cancer being treated             --BLE dopplers negative for DVT             -antiplatelet therapy: N/a 3. Pain Management: Continue oxycodone --increase to 10 mg q 4 hrs prn--d/c IV dilaudid.  8/29 pain is stable  8/30- pt said not asking regularly for pain meds  9/1- pt still having pain- encouraged to take meds if need be. Explained why he still having pain.  9/2- will add Lidocaine patch to R shoulder  4. Mood: LCSW to follow for evaluation and support.              -antipsychotic agents: N/a 5. Neuropsych: This patient is capable of making decisions on his own behalf. 6. Skin/Wound Care: Routine pressure relief measures.  7. Fluids/Electrolytes/Nutrition: Strict I/O. Check lytes in am.  8. Acute renal failure: Improving 81/4.92-->62/1.49  8/30- Cr 1.37 and  BUN 50  8/31- Cr 1.25 and Bun down to 45- much better  9/1- Cr 1.17 9. Hyponatremia: Recurrent hyponatremia. Improving to 124 on 8/28.  8/30- Na 127 today  9/1- Na 124- stable/OK per Nephrology due to high protein.   10. New diagnosis T2DM: Hgb A1c-6.5. BS poorly controlled due to steriods as well as Ensure tid between meals. Will change ensure to Ensure Max --continue Lantus 10 units at bedtime with 3  units meal coverage tid as well as SSI for elevated BS.    CBG (last 3)  Recent Labs    03/09/20 1638 03/09/20 2058 03/10/20 0634  GLUCAP 283* 291* 209*    8/28: Increase Lantus to 11U. Provided dietary counseling. Ordered dietary consult.   8/29: uncontrolled. Increase Lantus to 12U  8/30- Increase Lantus to 14 units  8/31- will wait 1 day to let Lantus work- got last night for first increase dose  9/1- will increase Lantus to 16 units due to elevated BGs  9/2- will increase lantus again- to 18 units 11. Constipation: Continue miralax am/change senna to lunch. KUB done showing ileus?  Wife/patient reports lot of gas and that he has been having BM daily. Pt says as well, BM daily.   8/27- adynamic ileus yesterday- will make sure pt has BM and recheck KUB in AM  8/29: KUB reviewed and shows moderate stool burden- he is having BM now 12. Mild BPH: will monitor voiding with PVR checks.  13. 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 Pathologic Fx T6 and T9:  Treated with XRT 10/10 and high dose steroids.  15. L foot drop- get prevalon boot on L at night  9/1- told wasn't in room- will order another PRAFO  16. Hyponatremia/Hyperkalemia  8/27- Na 122 this AM- will call Nephrology, esp because K+ 4.8 and was on Lokelma BID- changed to daily and held dose this AM.  8/3-- can stop Lokelma per nephrology and Na up to 127- doing better-   17. Hiccups- for 1 week  8/27- on Thorazine- will add baclofen 10 mg TID prn for hiccups  8/30- will change to QID and schedule it- also asked nursing  to give Thorazine when notices hiccups, if due- to try and help.   9/2- said hiccups much better 18. Thrombocytopenia  8/30- plts dropping- are 79k- if drop again by Wednesday, will need to see what else is possible other than Lovenox- will check with Heme/Onc.   8/31- labs in AM  9/1- Plts up to 92- doing better- will check 2x/week.  18. Dispo  8/30- pt/family want to have discussion Tuesday 1-1:30 pm to discuss medical issues- will try and do this.   9/1- no interpretor there- will do Thursday 19. Dizziness  9/1- not orthostatic- will encourage to push fluids and if doesn't fix, will order abd binder 20. N/V- vomited 3x this AM  9/2- could be chemo- could be meds- will add Zofran prn ; con't compazine; and alternate- ask nursing to give.  21. GERD  9/2- will increase PPI to BID 22. Insomnia  9/2- increase trazodone to 100 mg QHS    LOS: 7 days A FACE TO FACE EVALUATION WAS PERFORMED  Lillyanne Bradburn 03/10/2020, 9:11 AM

## 2020-03-10 NOTE — Evaluation (Signed)
Recreational Therapy Assessment and Plan  Patient Details  Name: Trevor Shelton MRN: [id number] Date of Birth: 11-21-1965 Today's Date: 03/10/2020  Rehab Potential:   Good ELOS:   d/c 9/15   Assessment Hospital Problem: Principal Problem:   Incomplete paraplegia [facility]) Active Problems:   Cord compression [facility])   Past Medical History:      Past Medical History:  Diagnosis Date  . AKI (acute kidney injury) ([place]) 02/2020  . Hypercalcemia 02/2020  . Hypercholesteremia   . Hypertension    Past Surgical History:       Past Surgical History:  Procedure Laterality Date  . HERNIA REPAIR    . IR FLUORO GUIDE CV LINE RIGHT  02/17/2020  . IR REMOVAL TUN CV CATH W/O FL  03/01/2020  . IR US GUIDE VASC ACCESS RIGHT  02/17/2020    Assessment & Plan Clinical Impression: Trevor Shelton is a 54 year old male in relatively good health except for a fall 4 feed off a ladder 01/26/20 with onset of back, left rib and progressive BLE weakness. He was evaluated by ortho on outpatient basis with treatment but continued to worsen with reports of burning pain in the back as well as decrease in UOP and constipation. He was admitted via ED for work up on 02/14/20 and found to have diffuse lytic lesions throughout the skeleton, epidural tumor extension with pathologic Fx of T6- (50% loss of ht)and T9 (10% loss of ht with 18m retropulsion), contrast enhancing mass replacing most of T5 vertebral body with severe cord compression, acute fracture of left 9 and 10 ribs. Work up concerning for MM as he was also found to have hypercalcemia with ARF and significant hyponatremia. Dr LAugustin Coupefelt recommended IVF for aggressive hydration as well as calcitonin, Zometa and pamidronate for management of hypercalcemia. hyponatremia felt to be due to paraproteinemia.   Dr. TNoni Sauperecommended XRT for high grade epidural disease as well as IV decadron for cord compression. He required RRT briefly due to signs of uremia and  as renal status improving with rise in Na to 133-->nephrology signed off. 24 hours urine showed M protein and modestly elevated Kappa ratio and bone marrow biopsy showed 20-30% plasma cells. Patient with newly diagnosed IgG Kappa MM and Dr. DLorenso Courierplans on CyBorD chemo to start prior to discharge?. He completes 10/10 doses of XRT and to consider oral po lxazomib/cytoxan/dexamethasone (to reduce burden of care) immediately following XRT.   BUN/SCr steadily improving but sodium continues to decline down to 123 today. Lokelmia added due to onset of hyperkalemia. Blood sugars poorly controlled and novolog added for better control. He has had issues with decline in po intake in the past 3 days (does not like our food), pain extending from back around his ribs, as well as BLE weakness. Has been going to BR after meals due to gas--continent and having BM daily per wife/patient. He feels that his voiding--dysuria has improved. He was working and independent till 2 weeks PTA. Therapy has been ongoing and working on pregait activity. CIR recommended due to functional decline.  Patient transferred to CIR on 03/03/2020 .    Pt presents with decreased activity tolerance, decreased functional mobility, decreased balance, decreased coordination Limiting pt's independence with leisure/community pursuits.  Met with pt x2 and pt placed on HOLD for TR services due to low activity tolerance.  Pt is motivated to participate in therapies but feel weak especially since starting chemotherapy.  Will continue to monitor through team.  Plan  no further TR at this time.  Recommendations for other services: Neuropsych  Discharge Criteria: Patient will be discharged from TR if patient refuses treatment 3 consecutive times without medical reason.  If treatment goals not met, if there is a change in medical status, if patient makes no progress towards goals or if patient is discharged from hospital.  The above assessment,  treatment plan, treatment alternatives and goals were discussed and mutually agreed upon: by patient  [place] 03/10/2020, 4:02 PM

## 2020-03-10 NOTE — Progress Notes (Signed)
Of note:  - Patient not completely emptying bladder when lying. It is better to get patient up to bedside commode to empty bladder.  - Patient abdomen seems a little distended. Complained of nausea, emesis x2 overnight. Compazine and Maalox PRN administered with some relief.

## 2020-03-10 NOTE — Plan of Care (Signed)
  Problem: Consults Goal: RH SPINAL CORD INJURY PATIENT EDUCATION Description:  See Patient Education module for education specifics.  Outcome: Progressing Goal: Skin Care Protocol Initiated - if Braden Score 18 or less Description: If consults are not indicated, leave blank or document N/A Outcome: Progressing Goal: Diabetes Guidelines if Diabetic/Glucose > 140 Description: If diabetic or lab glucose is > 140 mg/dl - Initiate Diabetes/Hyperglycemia Guidelines & Document Interventions  Outcome: Progressing   Problem: SCI BOWEL ELIMINATION Goal: RH STG MANAGE BOWEL WITH ASSISTANCE Description: STG Manage Bowel with mod Assistance. Outcome: Progressing Goal: RH STG MANAGE BOWEL W/EQUIPMENT W/ASSISTANCE Description: STG Manage Bowel With Equipment With mod Assistance Outcome: Progressing Goal: RH STG SCI MANAGE BOWEL PROGRAM W/ASSIST OR AS APPROPRIATE Description: STG SCI Manage bowel program w/mo assist or as appropriate. Outcome: Progressing   Problem: SCI BLADDER ELIMINATION Goal: RH STG MANAGE BLADDER WITH ASSISTANCE Description: STG Manage Bladder With min Assistance Outcome: Progressing Goal: RH STG MANAGE BLADDER WITH MEDICATION WITH ASSISTANCE Description: STG Manage Bladder With Medication With min Assistance. Outcome: Progressing   Problem: RH SKIN INTEGRITY Goal: RH STG SKIN FREE OF INFECTION/BREAKDOWN Description: Skin will be free of infection/breakdown with min assist Outcome: Progressing Goal: RH STG MAINTAIN SKIN INTEGRITY WITH ASSISTANCE Description: STG Maintain Skin Integrity With min Assistance. Outcome: Progressing   Problem: RH SAFETY Goal: RH STG ADHERE TO SAFETY PRECAUTIONS W/ASSISTANCE/DEVICE Description: STG Adhere to Safety Precautions With min Assistance/Device. Outcome: Progressing   Problem: RH PAIN MANAGEMENT Goal: RH STG PAIN MANAGED AT OR BELOW PT'S PAIN GOAL Description: Pain will be managed at or below 2 out of 10 with min assist Outcome:  Progressing   Problem: RH KNOWLEDGE DEFICIT SCI Goal: RH STG INCREASE KNOWLEDGE OF SELF CARE AFTER SCI Outcome: Progressing

## 2020-03-10 NOTE — Progress Notes (Signed)
Physical Therapy Session Note  Patient Details  Name: Trevor Shelton MRN: [id number] Date of Birth: [birth year]  Today's Date: 03/10/2020 PT Individual Time: 4098-1191 PT Individual Time Calculation (min): 30 min   and  Today's Date: 03/10/2020 PT Missed Time: 30 Minutes Missed Time Reason: Unavailable (Comment);Toileting (details below)  Short Term Goals: Week 1:  PT Short Term Goal 1 (Week 1): pt to demonstrate dynamic sitting balance at CGA for 34mins PT Short Term Goal 2 (Week 1): pt to demonstrate min A x1 for rolling consistently PT Short Term Goal 3 (Week 1): pt to demonstrate transfers to/from [facility] mod A with LRAD PT Short Term Goal 4 (Week 1): pt to self propel WC for 100' CGA with setup for parts  Skilled Therapeutic Interventions/Progress Updates:    Pt received supine in bed with his wife Verdis Frederickson), Dr. Dagoberto Ligas, and in-person interpreter present having a family meeting to discuss pt/family medical questions/concerns. Therapist to return at a later time as able.  Therapist returned to pt's room and he was supine in bed with his wife present and x-ray arriving for imaging study due to abdominal distension. RN present for medication administration. [employer] video interpreter Darvin Neighbours [phone]) used throughout session. Pt reports fatigue but agreeable to limited therapy session. Therapist inquired further about pt's prior reports of dizziness with him stating he is having very slight baseline "dizziness" just lying in bed - denies provocation during rolling but states dizziness worsens often with supine>sit mobility or when he is "making a great effort" to perform mobility tasks. Supine>sit R EOB via logroll technique with mod cuing and CGA for trunk control, no reports of increased dizziness at this time - while scooting to EOB pt noted to have significant B LE knee extension (difficult to determine if this is due to tone or impaired motor planning with pt activating quads as opposed to  hamstrings, also based on pt's movements anticipate he may have more significant hamstring weakness than quads but would need further assessment)  Sitting EOB donned shoes max assist for time management. Sit<>stands with mod assist of 1 using stedy focusing on B LE strengthening and standing tolerance - performed at least 8 reps total - pt demos heavy reliance on B UE support through stedy bar to come to standing with delayed hip extension causing pt to lean onto forearm support on stedy bar, cuing and manual facilitation to improve - demos B LE knees locking into extension while standing due to paresis and poor motor control with inability to perform slight knee bend without knees buckling into stedy pad. After ~4 reps pt reports increased "dizziness" or "fatigue" stating it is due to him exerting a great effort. Assessed vitals: BP 113/73 (MAP 85), HR 97bpm; reassessed after additional sit<>stands in stedy: BP 129/74 (MAP 89), HR 108bpm. Pt reporting need to use bathroom. Stedy transfer to [facility] over toilet. Standing with UE support on stedy and min assist during total assist LB clothing management - pt requests ~20minutes for toileting. Pt left seated on BSC over toilet with stedy in place, his wife present, therapist instructed pt to pull cord and NT notified of pt's position.  Missed 30 minutes of skilled physical therapy.  Therapy Documentation Precautions:  Precautions Precautions: Back, Fall (per chart review, no specific back precautions however encouraged per pt comfort) Precaution Comments: Significant BLE weakness and ataxia-like movements (LLE>RLE); back and rib precautions for comfort due to fxs Restrictions Weight Bearing Restrictions: No  Pain: Reports L anterior rib  pain during efforts to scoot to EOB otherwise no complaints of pain during session.   Therapy/Group: Individual Therapy  Tawana Scale , PT, DPT, CSRS  03/10/2020, 12:26 PM

## 2020-03-10 NOTE — Progress Notes (Signed)
Physical Therapy Session Note  Patient Details  Name: Trevor Shelton MRN: [id number] Date of Birth: [birth year]  Today's Date: 03/10/2020 PT Individual Time: 1000-1100 PT Individual Time Calculation (min): 60 min   Short Term Goals: Week 1:  PT Short Term Goal 1 (Week 1): pt to demonstrate dynamic sitting balance at CGA for 22mins PT Short Term Goal 2 (Week 1): pt to demonstrate min A x1 for rolling consistently PT Short Term Goal 3 (Week 1): pt to demonstrate transfers to/from [facility] mod A with LRAD PT Short Term Goal 4 (Week 1): pt to self propel WC for 100' CGA with setup for parts  Skilled Therapeutic Interventions/Progress Updates:    pt received in bed and agreeable to therapy, nursing present. Pt directed in bed mobility to come to sitting EOB, min A for BLE management, initially min A to right self in sitting EOB but improved to CGA, statically. Pt directed in steady transfer from EOB to toilet with (+) void of bladder and NCT present. Pt CGA for dynamic sitting balance and steady used for transfer to [facility] from toilet with mod A to ascend from toilet level height to steady. Pt directed in [place] mobility to gym 150' supervision and slide board transfer to mat table min A as this is higher than WC and required B LE management. Pt directed in dynamic sitting balance at EOM CGA-min A grossly for balance with VC for trunk extension and midline posture for reaching out/in with ball at arms length away and returning to midline x15, x15 overhead with one LOB posteriorly noted, mod A to right. Pt directed in additional dynamic balance in sitting with 3x15 ball toss in various directions, CGA and multiple LOBs noted when reaching outside BOS to R side, one total LOB in posterior direction with max A to right to midline. Pt benefited from TC at trunk to correct posture also pt reported being aware but required VC to correct of R foot positioning with activity to remain in foot flat position. Pt directed in lateral  scooting with head/trunk relationship to increase technique and level of I 2x5 R and L directions. Pt directed in SB transfer to return to [facility], min A, WC mobility to return to room 150' supervision and SB transfer to return to supine at nursing request for bladder scanning, min A and sit>supine mod A for BLE management onto bed. Pt left in supine, bed alarm on, All needs in reach and in good condition. Call light in hand.  interpretor present throughout. Pt denied pain at start and end.   Therapy Documentation Precautions:  Precautions Precautions: Back, Fall (per chart review, no specific back precautions however encouraged per pt comfort) Precaution Comments: Significant BLE weakness and ataxia-like movements (LLE>RLE); back and rib precautions for comfort due to fxs Restrictions Weight Bearing Restrictions: No    Pain: Pain Assessment Pain Scale: 0-10 Pain Score: 0-No pain    Therapy/Group: Individual Therapy  [name] 03/10/2020, 12:32 PM

## 2020-03-11 ENCOUNTER — Inpatient Hospital Stay (HOSPITAL_COMMUNITY): Payer: Self-pay

## 2020-03-11 ENCOUNTER — Other Ambulatory Visit: Payer: Self-pay | Admitting: Oncology

## 2020-03-11 ENCOUNTER — Other Ambulatory Visit: Payer: Self-pay | Admitting: Hematology and Oncology

## 2020-03-11 DIAGNOSIS — Z7189 Other specified counseling: Secondary | ICD-10-CM

## 2020-03-11 LAB — GLUCOSE, CAPILLARY
Glucose-Capillary: 266 mg/dL — ABNORMAL HIGH (ref 70–99)
Glucose-Capillary: 308 mg/dL — ABNORMAL HIGH (ref 70–99)
Glucose-Capillary: 205 mg/dL — ABNORMAL HIGH (ref 70–99)
Glucose-Capillary: 306 mg/dL — ABNORMAL HIGH (ref 70–99)

## 2020-03-11 MED ORDER — ACYCLOVIR 200 MG PO CAPS
400.0000 mg | ORAL_CAPSULE | Freq: Every day | ORAL | Status: DC
  Administered 2020-03-15 – 2020-03-19 (×5): 400 mg via ORAL
  Filled 2020-03-11 (×6): qty 2

## 2020-03-11 MED ORDER — MIDODRINE HCL 5 MG PO TABS
5.0000 mg | ORAL_TABLET | Freq: Every day | ORAL | Status: DC
  Administered 2020-03-11 – 2020-03-13 (×3): 5 mg via ORAL
  Filled 2020-03-11 (×3): qty 1

## 2020-03-11 MED ORDER — MAGNESIUM CITRATE PO SOLN
1.0000 | Freq: Once | ORAL | Status: AC
  Administered 2020-03-11: 1 via ORAL
  Filled 2020-03-11: qty 296

## 2020-03-11 MED ORDER — DEXAMETHASONE 4 MG PO TABS: 4.0000 mg | ORAL_TABLET | Freq: Two times a day (BID) | ORAL | Status: DC

## 2020-03-11 MED ORDER — DEXAMETHASONE 2 MG PO TABS: 2.0000 mg | ORAL_TABLET | Freq: Two times a day (BID) | ORAL | Status: DC

## 2020-03-11 MED ORDER — DEXAMETHASONE 4 MG PO TABS
4.0000 mg | ORAL_TABLET | Freq: Two times a day (BID) | ORAL | Status: DC
  Administered 2020-03-11 – 2020-03-14 (×7): 4 mg via ORAL
  Filled 2020-03-11 (×7): qty 1

## 2020-03-11 NOTE — Progress Notes (Signed)
Occupational Therapy Session Note  Patient Details  Name: Trevor Shelton MRN: [id number] Date of Birth: 03-21-1966  Today's Date: 03/11/2020 OT Individual Time: 0800-0900 OT Individual Time Calculation (min): 60 min    Short Term Goals: Week 2:  OT Short Term Goal 1 (Week 2): Pt will be able to push to stand with a RW with max A. OT Short Term Goal 2 (Week 2): Pt will perform squat pivot transfers with mod A OT Short Term Goal 3 (Week 2): Pt will perform toileting tasks with mod A (2/3)  Skilled Therapeutic Interventions/Progress Updates:    Pt resting in bed upon arrival with MD present. Remote interpreter utilized during session.  OT intervention with focus on bed mobility, SB transfer to w/c, bathing/dressing with sit<>stand at sink, w/c mobility, and sit<>stand from w/c with emphasis on technique and upright posture. Bed mobility with supervision.  SB transfer to w/c with min A.  LB bathing/dressing with mod A for standing and standing balance-OTA assisted with peri hygiene and pulling pants over hips while pt standing with three musketeers. Pt propelled w/c to gym and engaged in sit<>stand/standing tasks with mod A (three musketeers). Attempted to have pt stand with RW but unsuccessful.  Pt able to place BUE on RW after standing in three musketeers. Pt performed sit<>stand X 5. Pt B knees hyperextended in standing and pt unable to flex knees in a controlled manner.  Pt propelled w/c back to room and remained in w/c with all needs within reach and belt alarm activated.   Therapy Documentation Precautions:  Precautions Precautions: Back, Fall (per chart review, no specific back precautions however encouraged per pt comfort) Precaution Comments: Significant BLE weakness and ataxia-like movements (LLE>RLE); back and rib precautions for comfort due to fxs Restrictions Weight Bearing Restrictions: No Pain:  Pt c/o ongoing rib pain; MD aware  Therapy/Group: Individual Therapy  Leroy Libman 03/11/2020, 9:02 AM

## 2020-03-11 NOTE — Plan of Care (Signed)
  Problem: Consults Goal: RH SPINAL CORD INJURY PATIENT EDUCATION Description:  See Patient Education module for education specifics.  Outcome: Progressing Goal: Skin Care Protocol Initiated - if Braden Score 18 or less Description: If consults are not indicated, leave blank or document N/A Outcome: Progressing Goal: Diabetes Guidelines if Diabetic/Glucose > 140 Description: If diabetic or lab glucose is > 140 mg/dl - Initiate Diabetes/Hyperglycemia Guidelines & Document Interventions  Outcome: Progressing   Problem: SCI BOWEL ELIMINATION Goal: RH STG MANAGE BOWEL WITH ASSISTANCE Description: STG Manage Bowel with mod Assistance. Outcome: Progressing Goal: RH STG MANAGE BOWEL W/EQUIPMENT W/ASSISTANCE Description: STG Manage Bowel With Equipment With mod Assistance Outcome: Progressing Goal: RH STG SCI MANAGE BOWEL PROGRAM W/ASSIST OR AS APPROPRIATE Description: STG SCI Manage bowel program w/mo assist or as appropriate. Outcome: Progressing   Problem: SCI BLADDER ELIMINATION Goal: RH STG MANAGE BLADDER WITH ASSISTANCE Description: STG Manage Bladder With min Assistance Outcome: Progressing Goal: RH STG MANAGE BLADDER WITH MEDICATION WITH ASSISTANCE Description: STG Manage Bladder With Medication With min Assistance. Outcome: Progressing   Problem: RH SKIN INTEGRITY Goal: RH STG SKIN FREE OF INFECTION/BREAKDOWN Description: Skin will be free of infection/breakdown with min assist Outcome: Progressing Goal: RH STG MAINTAIN SKIN INTEGRITY WITH ASSISTANCE Description: STG Maintain Skin Integrity With min Assistance. Outcome: Progressing   Problem: RH SAFETY Goal: RH STG ADHERE TO SAFETY PRECAUTIONS W/ASSISTANCE/DEVICE Description: STG Adhere to Safety Precautions With min Assistance/Device. Outcome: Progressing   Problem: RH PAIN MANAGEMENT Goal: RH STG PAIN MANAGED AT OR BELOW PT'S PAIN GOAL Description: Pain will be managed at or below 2 out of 10 with min assist Outcome:  Progressing   Problem: Consults Goal: RH SPINAL CORD INJURY PATIENT EDUCATION Description:  See Patient Education module for education specifics.  Outcome: Progressing Goal: Skin Care Protocol Initiated - if Braden Score 18 or less Description: If consults are not indicated, leave blank or document N/A Outcome: Progressing Goal: Diabetes Guidelines if Diabetic/Glucose > 140 Description: If diabetic or lab glucose is > 140 mg/dl - Initiate Diabetes/Hyperglycemia Guidelines & Document Interventions  Outcome: Progressing   Problem: SCI BOWEL ELIMINATION Goal: RH STG MANAGE BOWEL WITH ASSISTANCE Description: STG Manage Bowel with mod Assistance. Outcome: Progressing Goal: RH STG MANAGE BOWEL W/EQUIPMENT W/ASSISTANCE Description: STG Manage Bowel With Equipment With mod Assistance Outcome: Progressing Goal: RH STG SCI MANAGE BOWEL PROGRAM W/ASSIST OR AS APPROPRIATE Description: STG SCI Manage bowel program w/mo assist or as appropriate. Outcome: Progressing   Problem: SCI BLADDER ELIMINATION Goal: RH STG MANAGE BLADDER WITH ASSISTANCE Description: STG Manage Bladder With min Assistance Outcome: Progressing Goal: RH STG MANAGE BLADDER WITH MEDICATION WITH ASSISTANCE Description: STG Manage Bladder With Medication With min Assistance. Outcome: Progressing   Problem: RH SKIN INTEGRITY Goal: RH STG SKIN FREE OF INFECTION/BREAKDOWN Description: Skin will be free of infection/breakdown with min assist Outcome: Progressing Goal: RH STG MAINTAIN SKIN INTEGRITY WITH ASSISTANCE Description: STG Maintain Skin Integrity With min Assistance. Outcome: Progressing   Problem: RH SAFETY Goal: RH STG ADHERE TO SAFETY PRECAUTIONS W/ASSISTANCE/DEVICE Description: STG Adhere to Safety Precautions With min Assistance/Device. Outcome: Progressing   Problem: RH PAIN MANAGEMENT Goal: RH STG PAIN MANAGED AT OR BELOW PT'S PAIN GOAL Description: Pain will be managed at or below 2 out of 10 with min  assist Outcome: Progressing

## 2020-03-11 NOTE — Progress Notes (Signed)
Physical Therapy Weekly Progress Note  Patient Details  Name: Trevor Shelton MRN: [id number] Date of Birth: 1966-01-16  Beginning of progress report period: March 04, 2020 End of progress report period: March 11, 2020  Today's Date: 03/11/2020 PT Individual Time: 1000-1100 PT Individual Time Calculation (min): 60 min   Patient has met 4 of 4 short term goals and is making steady progress w/functional mobility. He is motivated and tolerates IPR well.    Patient continues to demonstrate the following deficits muscle weakness, muscle joint tightness and muscle paralysis, decreased cardiorespiratoy endurance, impaired timing and sequencing, abnormal tone, unbalanced muscle activation, ataxia and decreased coordination and decreased sitting balance, decreased standing balance, decreased postural control and decreased balance strategies and therefore will continue to benefit from skilled PT intervention to increase functional independence with mobility.  Patient progressing toward long term goals..  Continue plan of care.  PT Short Term Goals Week 1:  PT Short Term Goal 1 (Week 1): pt to demonstrate dynamic sitting balance at CGA for 33mns PT Short Term Goal 1 - Progress (Week 1): Met PT Short Term Goal 2 (Week 1): pt to demonstrate min A x1 for rolling consistently PT Short Term Goal 2 - Progress (Week 1): Met PT Short Term Goal 3 (Week 1): [facility] PT Short Term Goal 3 - Progress (Week 1): Met PT Short Term Goal 4 (Week 1): pt to self propel WC for 100' CGA with setup for parts PT Short Term Goal 4 - Progress (Week 1): Met Week 2:  PT Short Term Goal 1 (Week 2): pt will roll independently using momentum strategy PT Short Term Goal 2 (Week 2): Pt will perform transfers w/LRAD w/min assist including uneven surfaces PT Short Term Goal 3 (Week 2): Pt will maintain static stand in stedy w/midline posture maintained x 30sec PT Short Term Goal 4 (Week  2): Pt will ascend/descend ramp w/wc w/min assist PT Short Term Goal 5 (Week 2): pt will perform side to sit on mat w/min assist  Skilled Therapeutic Interventions/Progress Updates:     Pt initially oob in wc, c/o bilat shoulder pain 6/10, states pain meds not helping, mild dizzyness.   BP assessed 120/80  Pain level reassessed throughout session and pt reports no increase, some decrease w/distraction/activity.  Pt propelled wc [id number]/supervision.  wc to mat w/set up and min assist, cues to attend to adjusting LE positioining.   Pt educated re paMultimedia programmer Pt worked on static and dynamic sitting balance including static sit/maintains >5 min w/cga,  upright posture in static sitting Static sit to side on elbow w/return to static sit/emphasis on maximizing upright posture/cga to min assist. Static sit w/alternating then bilat arm raises w/cga to occasional min assist. Static sit without UE support w/manual resistance applied at trunk to facilitate core activation, min assist, very difficult to activate abdominals for pt.  Sit to side to supine w/max assist. In supine worked on hip/pelvic stabilization/strengthening as follows: Single clamshells 2/8 each Bridging w/3 sec hold, difficult to maintain and lowers eccentrically. Supine alternating marches for transverse abd activation Resisted hip flexion x 5 each, cues for breathing Resisted hip abd/er and add/ir in bridge position x 8 each  Pt instructed w/rolling on mat via having him placing legs in bridge position then using momentum of bilat UEs clasped using 3 count to achieve full roll.  Repeated aprox 12 times initially min to mod assist to roll but able to  perform w/cues only w/repeated efforts.  Using above rolling technique, performed supine to side to sit w/cues and mod assist from elbow.  STS from mat using stedy.  Pt initially w/knees in hyperextension, hips flexed, abd propped against front bar of stedy, moderate  ant lean.   Provided tactile, verbal cues/instruction and repeated standing in steady x 5 total reps w/progressively improved activation of abs, gluts, quads and significantly improved upright posture w/repeated efforts.  Very challenging for pt, but decreased UE reliance from last session.  Pt transferred to wc via stedy.   wc propulsion gym to room mod I, occasional rest breaks. wc to bed via uphill SBT w/min A and cues for foot placement/adjustment during transfer. Sit to supine w/max assist due to post LOB/max assist for recovery.  Pt left supine w/rails up x 4, alarm set, bed in lowest position, and needs in reach.    Therapy Documentation Precautions:  Precautions Precautions: Back, Fall (per chart review, no specific back precautions however encouraged per pt comfort) Precaution Comments: Significant BLE weakness and ataxia-like movements (LLE>RLE); back and rib precautions for comfort due to fxs Restrictions Weight Bearing Restrictions: No   Therapy/Group: Individual Therapy  Jerrilyn Cairo 03/11/2020, 12:15 PM

## 2020-03-11 NOTE — Progress Notes (Signed)
START ON PATHWAY REGIMEN - Multiple Myeloma and Other Plasma Cell Dyscrasias     A cycle is every 28 days:     Dexamethasone      Bortezomib      Cyclophosphamide   **Always confirm dose/schedule in your pharmacy ordering system**  Patient Characteristics: Multiple Myeloma, Newly Diagnosed, Transplant Eligible, Unknown or Awaiting Test Results Disease Classification: Multiple Myeloma R-ISS Staging: II Therapeutic Status: Newly Diagnosed Is Patient Eligible for Transplant<= Transplant Eligible Risk Status: Awaiting Test Results Intent of Therapy: Curative Intent, Discussed with Patient 

## 2020-03-11 NOTE — Progress Notes (Signed)
[facility] [phone]   [phone]  PROGRESS NOTE  Patient Care Team: Patient, No Pcp Per as PCP - General (General Practice)  Hematological/Oncological History # Lytic Lesions Throughout Skeleton/Hypercalcemia # Pathologic Fractures of Spine/Soft Tissue Masses in Spine #Concern for Multiple Myeloma 1) 02/14/2020: presented to the emergency department with markedly worsening back pain. 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 The T6 lesion severely narrows the thecal sac and deforms the spinal cord. 3) 02/18/2020: establish care with Dr. Lorenso Courier    HISTORY OF PRESENTING ILLNESS:  Trevor Shelton 54 y.o. male with medical history significant for HTN and HLD who presented with back pain and was found to have cord compression and lytic lesions concerning for multiple myeloma.   On review of the previous records progress he initially presented to the emergency department on 02/14/2020 with markedly worsening back pain. The pain has been going on for approximately 1 month's time when he initially presented. In the emergency department had a CT chest abdomen pelvis which showed lytic lesions throughout the skeleton consistent with multiple myeloma or metastatic disease. There were also signs of compression fracture of the spine. An MRI spine was ordered which showed epidural tumor extension at T6 and T8-9. The T6 lesion severely narrowed the thecal sac and deforms the spinal cord. The patient was referred to radiation oncology. In addition to radiation oncology, medical oncology was consulted for further evaluation and management as well.  The patient was seen today with the assistance of an interpreter.  His wife is at the bedside. The patient is currently admitted to [facility].  He is working with therapy and reports a small improvement  with his strength.  He states that he is still unable to stand.  Pain improved overall.  Rates pain 4-5 out of 10 at this time.  He reports morning dizziness and some mild visual changes.  These resolve as the day goes on. Denies nausea, vomiting, constipation, diarrhea.  The remainder of a 10 point review of systems is noncontributory.  MEDICAL HISTORY:  Past Medical History:  Diagnosis Date  . AKI (acute kidney injury) ([facility]) 02/2020  . Hypercalcemia 02/2020  . Hypercholesteremia   . Hypertension     SURGICAL HISTORY: Past Surgical History:  Procedure Laterality Date  . HERNIA REPAIR    . IR FLUORO GUIDE CV LINE RIGHT  02/17/2020  . IR REMOVAL TUN CV CATH W/O FL  03/01/2020  . IR US GUIDE VASC ACCESS RIGHT  02/17/2020    SOCIAL HISTORY: Social History   Socioeconomic History  . Marital status: Married    Spouse name: Not on file  . Number of children: Not on file  . Years of education: Not on file  . Highest education level: Not on file  Occupational History  . Not on file  Tobacco Use  . Smoking status: Never Smoker  . Smokeless tobacco: Never Used  Vaping Use  . Vaping Use: Never used  Substance and Sexual Activity  . Alcohol use: Not Currently  . Drug use: Not Currently  . Sexual activity: Not Currently  Other Topics Concern  . Not on file  Social History Narrative  . Not on file   Social Determinants of Health   Financial Resource Strain:   . Difficulty of Paying Living Expenses: Not on file  Food Insecurity:   . Worried About Running  Out of Food in the Last Year: Not on file  . Ran Out of Food in the Last Year: Not on file  Transportation Needs:   . Lack of Transportation (Medical): Not on file  . Lack of Transportation (Non-Medical): Not on file  Physical Activity:   . Days of Exercise per Week: Not on file  . Minutes of Exercise per Session: Not on file  Stress:   . Feeling of Stress : Not on file  Social Connections:   . Frequency of Communication  with Friends and Family: Not on file  . Frequency of Social Gatherings with Friends and Family: Not on file  . Attends Religious Services: Not on file  . Active Member of Clubs or Organizations: Not on file  . Attends Archivist Meetings: Not on file  . Marital Status: Not on file  Intimate Partner Violence:   . Fear of Current or Ex-Partner: Not on file  . Emotionally Abused: Not on file  . Physically Abused: Not on file  . Sexually Abused: Not on file    FAMILY HISTORY: Family History  Problem Relation Age of Onset  . Alzheimer's disease Mother   . Diabetes Father   . Diabetes Brother     ALLERGIES:  has No Known Allergies.  MEDICATIONS:  Current Facility-Administered Medications  Medication Dose Route Frequency Provider Last Rate Last Admin  . (feeding supplement) PROSource Plus liquid 30 mL  30 mL Oral BID BM Love, Pamela S, PA-C   30 mL at 03/11/20 1116  . acetaminophen (TYLENOL) tablet 325-650 mg  325-650 mg Oral Q4H PRN Bary Leriche, PA-C   650 mg at 03/05/20 8882  . alum & mag hydroxide-simeth (MAALOX/MYLANTA) 200-200-20 MG/5ML suspension 30 mL  30 mL Oral Q4H PRN Bary Leriche, PA-C   30 mL at 03/10/20 0506  . amLODipine (NORVASC) tablet 10 mg  10 mg Oral Daily Bary Leriche, PA-C   10 mg at 03/11/20 0754  . atorvastatin (LIPITOR) tablet 20 mg  20 mg Oral Daily Love, Ivan Anchors, PA-C   20 mg at 03/11/20 1117  . baclofen (LIORESAL) tablet 10 mg  10 mg Oral QID Lovorn, Jinny Blossom, MD   10 mg at 03/11/20 1116  . bisacodyl (DULCOLAX) suppository 10 mg  10 mg Rectal Daily PRN Love, Pamela S, PA-C      . carvedilol (COREG) tablet 3.125 mg  3.125 mg Oral BID WC Love, Ivan Anchors, PA-C   3.125 mg at 03/11/20 0754  . chlorproMAZINE (THORAZINE) tablet 25 mg  25 mg Oral QID PRN Lovorn, Megan, MD      . clonazePAM (KLONOPIN) disintegrating tablet 0.25 mg  0.25 mg Oral TID PRN Bary Leriche, PA-C   0.25 mg at 03/10/20 0142  . dexamethasone (DECADRON) tablet 4 mg  4 mg Oral Q8H  Lovorn, Megan, MD   4 mg at 03/11/20 1300  . diphenhydrAMINE (BENADRYL) 12.5 MG/5ML elixir 12.5-25 mg  12.5-25 mg Oral Q6H PRN Love, Pamela S, PA-C      . enoxaparin (LOVENOX) injection 40 mg  40 mg Subcutaneous Q24H Love, Pamela S, PA-C   40 mg at 03/11/20 1259  . guaiFENesin-dextromethorphan (ROBITUSSIN DM) 100-10 MG/5ML syrup 5-10 mL  5-10 mL Oral Q6H PRN Love, Pamela S, PA-C      . insulin aspart (novoLOG) injection 0-9 Units  0-9 Units Subcutaneous TID WC LoveIvan Anchors, PA-C   7 Units at 03/11/20 1259  . insulin aspart (novoLOG) injection 3 Units  3 Units Subcutaneous TID WC Bary Leriche, PA-C   3 Units at 03/11/20 1300  . insulin glargine (LANTUS) injection 18 Units  18 Units Subcutaneous QHS Lovorn, Jinny Blossom, MD   18 Units at 03/10/20 2122  . lidocaine (LIDODERM) 5 % 2 patch  2 patch Transdermal Q24H Lovorn, Megan, MD   2 patch at 03/11/20 1117  . lidocaine (XYLOCAINE) 2 % jelly   Topical PRN Love, Pamela S, PA-C      . lidocaine (XYLOCAINE) 2 % viscous mouth solution 15 mL  15 mL Mouth/Throat Q6H PRN Love, Pamela S, PA-C      . magnesium citrate solution 1 Bottle  1 Bottle Oral Once Lovorn, Megan, MD      . midodrine (PROAMATINE) tablet 5 mg  5 mg Oral Daily Lovorn, Megan, MD   5 mg at 03/11/20 1117  . ondansetron (ZOFRAN) tablet 4 mg  4 mg Oral Q6H PRN Lovorn, Megan, MD      . oxyCODONE (Oxy IR/ROXICODONE) immediate release tablet 5-10 mg  5-10 mg Oral Q4H PRN Bary Leriche, PA-C   10 mg at 03/11/20 0754  . pantoprazole (PROTONIX) EC tablet 40 mg  40 mg Oral BID AC Lovorn, Megan, MD   40 mg at 03/11/20 0546  . phenol (CHLORASEPTIC) mouth spray 1 spray  1 spray Mouth/Throat PRN Love, Pamela S, PA-C      . polyethylene glycol (MIRALAX / GLYCOLAX) packet 17 g  17 g Oral Daily PRN Love, Pamela S, PA-C      . polyethylene glycol (MIRALAX / GLYCOLAX) packet 17 g  17 g Oral Daily Bary Leriche, PA-C   17 g at 03/11/20 0754  . prochlorperazine (COMPAZINE) tablet 5-10 mg  5-10 mg Oral Q6H PRN  Bary Leriche, PA-C   5 mg at 03/10/20 2125   Or  . prochlorperazine (COMPAZINE) injection 5-10 mg  5-10 mg Intramuscular Q6H PRN Bary Leriche, PA-C   10 mg at 03/10/20 0142   Or  . prochlorperazine (COMPAZINE) suppository 12.5 mg  12.5 mg Rectal Q6H PRN Love, Pamela S, PA-C      . protein supplement (ENSURE MAX) liquid  11 oz Oral BID BM Love, Pamela S, PA-C   11 oz at 03/11/20 1000  . senna-docusate (Senokot-S) tablet 2 tablet  2 tablet Oral Q lunch Bary Leriche, [place]   2 tablet at 03/11/20 1116  . simethicone (MYLICON) chewable tablet 80 mg  80 mg Oral QID PRN Bary Leriche, PA-C   80 mg at 03/06/20 2238  . traMADol (ULTRAM) tablet 50 mg  50 mg Oral Q4H PRN Bary Leriche, PA-C   50 mg at 03/09/20 2159  . traZODone (DESYREL) tablet 100 mg  100 mg Oral QHS Lovorn, Megan, MD   100 mg at 03/10/20 2123  . white petrolatum (VASELINE) gel   Topical PRN Lovorn, Megan, MD        REVIEW OF SYSTEMS:   Constitutional: ( - ) fevers, ( - )  chills , ( - ) night sweats Eyes: ( - ) blurriness of vision, ( - ) double vision, ( - ) watery eyes Ears, nose, mouth, throat, and face: ( - ) mucositis, ( - ) sore throat Respiratory: ( - ) cough, ( - ) dyspnea, ( - ) wheezes Cardiovascular: ( - ) palpitation, ( - ) chest discomfort, ( - ) lower extremity swelling Gastrointestinal:  ( - ) nausea, ( - ) heartburn, ( - ) change  in bowel habits Skin: ( - ) abnormal skin rashes Lymphatics: ( - ) new lymphadenopathy, ( - ) easy bruising Neurological: ( - ) numbness, ( - ) tingling, ( - ) new weaknesses Behavioral/Psych: ( - ) mood change, ( - ) new changes  All other systems were reviewed with the patient and are negative.  PHYSICAL EXAMINATION:  GENERAL: Laying in bed, awake and alert, no distress SKIN: skin color, texture, turgor are normal, no rashes or significant lesions EYES: conjunctiva are pink and non-injected, sclera is injected LUNGS: clear to auscultation and percussion with normal breathing  effort HEART: regular rate & rhythm and no murmurs and no lower extremity edema ABDOMEN: soft, non-tender, non-distended, normal bowel sounds Musculoskeletal: no cyanosis of digits and no clubbing.  5/5 strength in the right lower extremity and 3/5 strength in the left lower extremity. PSYCH: alert & oriented x 3, fluent speech NEURO: no focal motor/sensory deficits  LABORATORY DATA:  I have reviewed the data as listed CBC Latest Ref Rng & Units 03/09/2020 03/07/2020 03/04/2020  WBC 4.0 - 10.5 K/uL 5.0 4.3 7.9  Hemoglobin 13.0 - 17.0 g/dL 10.3(L) 8.8(L) 10.4(L)  Hematocrit 39 - 52 % 30.1(L) 25.6(L) 30.2(L)  Platelets 150 - 400 K/uL 92(L) 79(L) 121(L)    CMP Latest Ref Rng & Units 03/09/2020 03/08/2020 03/07/2020  Glucose 70 - 99 mg/dL 263(H) 217(H) 214(H)  BUN 6 - 20 mg/dL 43(H) 45(H) 50(H)  Creatinine 0.61 - 1.24 mg/dL 1.16 1.25(H) 1.37(H)  Sodium 135 - 145 mmol/L 124(L) 127(L) 127(L)  Potassium 3.5 - 5.1 mmol/L 4.4 4.5 4.6  Chloride 98 - 111 mmol/L 95(L) 96(L) 98  CO2 22 - 32 mmol/L 19(L) 22 21(L)  Calcium 8.9 - 10.3 mg/dL 9.0 9.3 9.7  Total Protein 6.5 - 8.1 g/dL - - -  Total Bilirubin 0.3 - 1.2 mg/dL - - -  Alkaline Phos 38 - 126 U/L - - -  AST 15 - 41 U/L - - -  ALT 0 - 44 U/L - - -     RADIOGRAPHIC STUDIES: I have personally reviewed the radiological images as listed and agreed with the findings in the report pronounced lytic lesions and cord compression in thoracic spine.   CT ABDOMEN PELVIS WO CONTRAST  Result Date: 02/14/2020 CLINICAL DATA:  Abdominal trauma. Trauma 1 month ago. Hypercalcemia. Fell 1 month ago. Abdominal pain, LOWER back pain, vomiting, diarrhea since the fall. EXAM: CT CHEST, ABDOMEN AND PELVIS WITHOUT CONTRAST TECHNIQUE: Multidetector CT imaging of the chest, abdomen and pelvis was performed following the standard protocol without IV contrast. COMPARISON:  None. FINDINGS: CT CHEST FINDINGS Cardiovascular: Heart is normal in size. Minimal atherosclerotic  calcification of the coronary vessels. There is focal calcification of the ascending thoracic aorta, not associated with aneurysm. Normal noncontrast appearance of the pulmonary arteries. Mediastinum/Nodes: The visualized portion of the thyroid gland has a normal appearance. No significant mediastinal, hilar, or axillary adenopathy. Lungs/Pleura: There is subsegmental atelectasis at the lung bases. No suspicious pulmonary nodules. There is focal pleural thickening adjacent to LEFT anterior second rib lesion. Musculoskeletal: Diffuse lytic lesions are identified throughout the skeleton. Lesions vary in size from 2 millimeters to 1.8 centimeters. There is near complete replacement of the T5 vertebral body and loss of vertebral body height by approximately 30%. A soft tissue component extends to the RIGHT of the vertebral body and extends into the canal, impinging on the spinal cord, image 25 of series 3. Soft tissue mass in this region is 4.2  x 3.2 centimeters. At T9, there is near complete replacement of the vertebral body with soft tissue component to the RIGHT of the vertebral body. Loss of vertebral body height is approximately 30%. Suspect impingement of the cord at this level. Image 38 of series 3. Large lytic lesion is identified within the T5 vertebral body, measuring 1.8 centimeters. This lesion extends to the RIGHT cortex 2. Acute fracture of LEFT ribs 9 and 10, possibly pathologic. There is a soft tissue component associated with lesions in the LEFT second rib. Soft tissue component measures 1.1 x 1.8 centimeters. CT ABDOMEN PELVIS FINDINGS Hepatobiliary: No focal liver abnormality is seen. No gallstones, gallbladder wall thickening, or biliary dilatation. Pancreas: Unremarkable. No pancreatic ductal dilatation or surrounding inflammatory changes. Spleen: Normal in size without focal abnormality. Adrenals/Urinary Tract: Adrenal glands are unremarkable. Kidneys are normal, without renal calculi, focal  lesion, or hydronephrosis. Bladder is unremarkable. Stomach/Bowel: Stomach and small bowel loops are unremarkable. The appendix is well seen and has a normal appearance. Loops of colon are unremarkable. Average stool burden. Vascular/Lymphatic: No significant vascular findings are present. No enlarged abdominal or pelvic lymph nodes. Reproductive: Prostate is mildly enlarged. There are prostatic calcifications. Other: Fat containing RIGHT inguinal hernia.  No ascites. Musculoskeletal: Diffuse lytic lesions throughout the pelvis, proximal femurs, and lumbar spine. IMPRESSION: 1. Diffuse lytic lesions throughout the skeleton, consistent with multiple myeloma or metastatic disease. Multiple myeloma is favored. 2. Pathologic fractures at T5 and T9. These levels show significant soft tissue mass, and impingement upon the spinal cord. Recommend further evaluation with MRI of the thoracic spine with and without contrast. 3. Acute fractures of LEFT ribs 9 and 10, possibly pathologic. 4. No evidence for acute injury of the abdomen or pelvis. 5. Fat containing RIGHT inguinal hernia. 6. Mildly enlarged prostate. 7. Aortic Atherosclerosis (ICD10-I70.0). These results were called by telephone at the time of interpretation on 02/14/2020 at 4:06 pm to provider [id number] [facility] , who verbally acknowledged these results. Electronically Signed   By: Nolon Nations M.D.   On: 02/14/2020 16:07   DG Abd 1 View  Result Date: 03/10/2020 CLINICAL DATA:  Abdominal pain. EXAM: ABDOMEN - 1 VIEW COMPARISON:  Radiograph 03/05/2020. FINDINGS: Mild gaseous distention of large and small bowel in a nonobstructive pattern. Small to moderate stool in the right colon, with diminished stool burden from prior exam. There is no evidence of free intra-abdominal air. No radiopaque calculi. No osseous abnormalities are seen. IMPRESSION: Bowel-gas pattern suggestive of ileus, with slight improvement in gaseous bowel distension over the past few days.  Electronically Signed   By: Keith Rake M.D.   On: 03/10/2020 14:13   DG Abd 1 View  Result Date: 03/05/2020 CLINICAL DATA:  Abdominal pain and bloating. EXAM: ABDOMEN - 1 VIEW COMPARISON:  Abdominal radiograph 03/03/2020, CT 02/14/2020 FINDINGS: No evidence of bowel obstruction on the supine views. Mild gaseous distention of stomach with slight improvement from prior. Mild gaseous small bowel distension and a generalized pattern. Moderate colonic stool burden. No abnormal rectal distention. No radiopaque calculi. No acute osseous abnormalities are seen. IMPRESSION: Ileus bowel gas pattern with mild gaseous distension of small bowel loops and stomach with slight improvement from recent exam. No evidence of developing obstruction. Moderate colonic stool burden. Electronically Signed   By: Keith Rake M.D.   On: 03/05/2020 15:47   DG Abd 1 View  Result Date: 03/03/2020 CLINICAL DATA:  Cord compression.  Left upper quadrant pain. EXAM: ABDOMEN - 1 VIEW COMPARISON:  CT 02/14/2020. FINDINGS: Distended loops of small and large bowel noted suggesting adynamic ileus. Recent CT of the abdomen pelvis of 02/14/2020 did reveal a right inguinal hernia with herniation of fat. No free air. Bony lesions present best identified by prior CT. IMPRESSION: Distended loops of small and large bowel noted suggesting adynamic ileus. Recent CT of the abdomen pelvis of 02/14/2020 however did reveal a right inguinal hernia with herniation of fat. Follow-up exams suggested to demonstrate resolution of bowel distention in order to exclude bowel obstruction. Electronically Signed   By: Marcello Moores  Register   On: 03/03/2020 13:56   CT Chest Wo Contrast  Result Date: 02/14/2020 CLINICAL DATA:  Abdominal trauma. Trauma 1 month ago. Hypercalcemia. Fell 1 month ago. Abdominal pain, LOWER back pain, vomiting, diarrhea since the fall. EXAM: CT CHEST, ABDOMEN AND PELVIS WITHOUT CONTRAST TECHNIQUE: Multidetector CT imaging of the chest,  abdomen and pelvis was performed following the standard protocol without IV contrast. COMPARISON:  None. FINDINGS: CT CHEST FINDINGS Cardiovascular: Heart is normal in size. Minimal atherosclerotic calcification of the coronary vessels. There is focal calcification of the ascending thoracic aorta, not associated with aneurysm. Normal noncontrast appearance of the pulmonary arteries. Mediastinum/Nodes: The visualized portion of the thyroid gland has a normal appearance. No significant mediastinal, hilar, or axillary adenopathy. Lungs/Pleura: There is subsegmental atelectasis at the lung bases. No suspicious pulmonary nodules. There is focal pleural thickening adjacent to LEFT anterior second rib lesion. Musculoskeletal: Diffuse lytic lesions are identified throughout the skeleton. Lesions vary in size from 2 millimeters to 1.8 centimeters. There is near complete replacement of the T5 vertebral body and loss of vertebral body height by approximately 30%. A soft tissue component extends to the RIGHT of the vertebral body and extends into the canal, impinging on the spinal cord, image 25 of series 3. Soft tissue mass in this region is 4.2 x 3.2 centimeters. At T9, there is near complete replacement of the vertebral body with soft tissue component to the RIGHT of the vertebral body. Loss of vertebral body height is approximately 30%. Suspect impingement of the cord at this level. Image 38 of series 3. Large lytic lesion is identified within the T5 vertebral body, measuring 1.8 centimeters. This lesion extends to the RIGHT cortex 2. Acute fracture of LEFT ribs 9 and 10, possibly pathologic. There is a soft tissue component associated with lesions in the LEFT second rib. Soft tissue component measures 1.1 x 1.8 centimeters. CT ABDOMEN PELVIS FINDINGS Hepatobiliary: No focal liver abnormality is seen. No gallstones, gallbladder wall thickening, or biliary dilatation. Pancreas: Unremarkable. No pancreatic ductal dilatation  or surrounding inflammatory changes. Spleen: Normal in size without focal abnormality. Adrenals/Urinary Tract: Adrenal glands are unremarkable. Kidneys are normal, without renal calculi, focal lesion, or hydronephrosis. Bladder is unremarkable. Stomach/Bowel: Stomach and small bowel loops are unremarkable. The appendix is well seen and has a normal appearance. Loops of colon are unremarkable. Average stool burden. Vascular/Lymphatic: No significant vascular findings are present. No enlarged abdominal or pelvic lymph nodes. Reproductive: Prostate is mildly enlarged. There are prostatic calcifications. Other: Fat containing RIGHT inguinal hernia.  No ascites. Musculoskeletal: Diffuse lytic lesions throughout the pelvis, proximal femurs, and lumbar spine. IMPRESSION: 1. Diffuse lytic lesions throughout the skeleton, consistent with multiple myeloma or metastatic disease. Multiple myeloma is favored. 2. Pathologic fractures at T5 and T9. These levels show significant soft tissue mass, and impingement upon the spinal cord. Recommend further evaluation with MRI of the thoracic spine with and without contrast. 3. Acute  fractures of LEFT ribs 9 and 10, possibly pathologic. 4. No evidence for acute injury of the abdomen or pelvis. 5. Fat containing RIGHT inguinal hernia. 6. Mildly enlarged prostate. 7. Aortic Atherosclerosis (ICD10-I70.0). These results were called by telephone at the time of interpretation on 02/14/2020 at 4:06 pm to provider [facility] , who verbally acknowledged these results. Electronically Signed   By: Nolon Nations M.D.   On: 02/14/2020 16:07   MR THORACIC SPINE W WO CONTRAST  Result Date: 02/14/2020 CLINICAL DATA:  Back pain. Compression fracture. Fever with nausea and vomiting. EXAM: MRI THORACIC WITHOUT AND WITH CONTRAST TECHNIQUE: Multiplanar and multiecho pulse sequences of the thoracic spine were obtained without and with intravenous contrast. CONTRAST:  73m GADAVIST GADOBUTROL 1 MMOL/ML IV  SOLN COMPARISON:  None. FINDINGS: MRI THORACIC SPINE FINDINGS Alignment:  Normal Vertebrae: There are numerous bone marrow lesions throughout all visualized levels, but worst at T5, T6 and T9. There is compression deformity of T6 with 50% height loss and 5 mm of retropulsion. There is compression deformity of T9 with approximately 10% height loss and 3 mm of retropulsion. Cord: There is mild hyperintense T2-weighted signal in the spinal cord at T5. Paraspinal and other soft tissues: There is soft tissue mass is associated with the vertebral lesions at T6 and T9. Otherwise, the paraspinal soft tissues are unremarkable. Disc levels: T5-6: Contrast-enhancing mass replaces most of the vertebral body bone marrow and extends into the pedicles, transverse processes and the right paraspinal tissues. There is also epidural extension that severely narrows the thecal sac and causes mass effect on the spinal cord. The mass also severely narrows the right T5 neural foramen. T8-9: Soft tissue mass replacing much of the bone marrow at T9 with epidural extension mildly narrowing the thecal sac with slight mass effect on the spinal cord. The mass extends into the right neural foramen. IMPRESSION: 1. Numerous bone marrow lesions throughout all visualized levels, but worst at T5, T6 and T9. 2. 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 The T6 lesion severely narrows the thecal sac and deforms the spinal cord. 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 3. Pathologic fractures of T6 and T9 with 3-5 mm of retropulsion. Electronically Signed   By: KUlyses JarredM.D.   On: 02/14/2020 19:14   IR Fluoro Guide CV Line Right  Result Date: 02/17/2020 CLINICAL DATA:  Renal failure and need for tunneled hemodialysis catheter to begin hemodialysis. EXAM: TUNNELED CENTRAL VENOUS HEMODIALYSIS CATHETER PLACEMENT WITH ULTRASOUND AND FLUOROSCOPIC GUIDANCE ANESTHESIA/SEDATION: 1.0 mg IV  Versed; 50 mcg IV Fentanyl. Total Moderate Sedation Time:   23 minutes. The patient's level of consciousness and physiologic status were continuously monitored during the procedure by Radiology nursing. MEDICATIONS: 2 g IV Ancef. FLUOROSCOPY TIME:  48 seconds. 4.0 mGy. PROCEDURE: The procedure, risks, benefits, and alternatives were explained to the patient. Questions regarding the procedure were encouraged and answered. The patient understands and consents to the procedure. A timeout was performed prior to initiating the procedure. The right neck and chest were prepped with chlorhexidine in a sterile fashion, and a sterile drape was applied covering the operative field. Maximum barrier sterile technique with sterile gowns and gloves were used for the procedure. Local anesthesia was provided with 1% lidocaine. Ultrasound was used to confirm patency of the right internal jugular vein. After creating a small venotomy incision, a 21 gauge needle was advanced into the right internal jugular vein under direct,  real-time ultrasound guidance. Ultrasound image documentation was performed. After securing guidewire access, an 8 Fr dilator was placed. A J-wire was kinked to measure appropriate catheter length. A Palindrome tunneled hemodialysis catheter measuring 19 cm from tip to cuff was chosen for placement. This was tunneled in a retrograde fashion from the chest wall to the venotomy incision. At the venotomy, serial dilatation was performed and a 15 Fr peel-away sheath was placed over a guidewire. The catheter was then placed through the sheath and the sheath removed. Final catheter positioning was confirmed and documented with a fluoroscopic spot image. The catheter was aspirated, flushed with saline, and injected with appropriate volume heparin dwells. The venotomy incision was closed with subcuticular 4-0 Vicryl. Dermabond was applied to the incision. The catheter exit site was secured with 0-Prolene retention sutures.  COMPLICATIONS: None.  No pneumothorax. FINDINGS: After catheter placement, the tip lies in the right atrium. The catheter aspirates normally and is ready for immediate use. IMPRESSION: Placement of tunneled hemodialysis catheter via the right internal jugular vein. The catheter tip lies in the right atrium. The catheter is ready for immediate use. Electronically Signed   By: Aletta Edouard M.D.   On: 02/17/2020 17:22   IR Removal Tun Cv Cath W/O FL  Result Date: 03/01/2020 INDICATION: Patient with history of multiple myeloma with acute kidney injury and placement of tunneled right internal jugular HD catheter on 02/17/2020; now with improving creatinine and urine output. Request received for HD catheter removal. EXAM: REMOVAL TUNNELED CENTRAL VENOUS CATHETER MEDICATIONS: None ANESTHESIA/SEDATION: None FLUOROSCOPY TIME:  None COMPLICATIONS: None immediate. PROCEDURE: Informed written consent was obtained from the patient after a thorough discussion of the procedural risks, benefits and alternatives. All questions were addressed. Maximal Sterile Barrier Technique was utilized including caps, mask, sterile gowns, sterile gloves, sterile drape, hand hygiene and skin antiseptic. A timeout was performed prior to the initiation of the procedure. The patient's right chest and catheter was prepped and draped in a normal sterile fashion. Heparin was removed from both ports of catheter. Using gentle manual traction the cuff of the catheter was exposed and the catheter was removed in it's entirety. Pressure was held till hemostasis was obtained. A sterile dressing was applied. The patient tolerated the procedure well with no immediate complications. IMPRESSION: Successful catheter removal as described above. Read by: Rowe Robert, PA-C Electronically Signed   By: Jacqulynn Cadet M.D.   On: 03/01/2020 13:40   IR US Guide Vasc Access Right  Result Date: 02/17/2020 CLINICAL DATA:  Renal failure and need for tunneled  hemodialysis catheter to begin hemodialysis. EXAM: TUNNELED CENTRAL VENOUS HEMODIALYSIS CATHETER PLACEMENT WITH ULTRASOUND AND FLUOROSCOPIC GUIDANCE ANESTHESIA/SEDATION: 1.0 mg IV Versed; 50 mcg IV Fentanyl. Total Moderate Sedation Time:   23 minutes. The patient's level of consciousness and physiologic status were continuously monitored during the procedure by Radiology nursing. MEDICATIONS: 2 g IV Ancef. FLUOROSCOPY TIME:  48 seconds. 4.0 mGy. PROCEDURE: The procedure, risks, benefits, and alternatives were explained to the patient. Questions regarding the procedure were encouraged and answered. The patient understands and consents to the procedure. A timeout was performed prior to initiating the procedure. The right neck and chest were prepped with chlorhexidine in a sterile fashion, and a sterile drape was applied covering the operative field. Maximum barrier sterile technique with sterile gowns and gloves were used for the procedure. Local anesthesia was provided with 1% lidocaine. Ultrasound was used to confirm patency of the right internal jugular vein. After creating a small venotomy  incision, a 21 gauge needle was advanced into the right internal jugular vein under direct, real-time ultrasound guidance. Ultrasound image documentation was performed. After securing guidewire access, an 8 Fr dilator was placed. A J-wire was kinked to measure appropriate catheter length. A Palindrome tunneled hemodialysis catheter measuring 19 cm from tip to cuff was chosen for placement. This was tunneled in a retrograde fashion from the chest wall to the venotomy incision. At the venotomy, serial dilatation was performed and a 15 Fr peel-away sheath was placed over a guidewire. The catheter was then placed through the sheath and the sheath removed. Final catheter positioning was confirmed and documented with a fluoroscopic spot image. The catheter was aspirated, flushed with saline, and injected with appropriate volume  heparin dwells. The venotomy incision was closed with subcuticular 4-0 Vicryl. Dermabond was applied to the incision. The catheter exit site was secured with 0-Prolene retention sutures. COMPLICATIONS: None.  No pneumothorax. FINDINGS: After catheter placement, the tip lies in the right atrium. The catheter aspirates normally and is ready for immediate use. IMPRESSION: Placement of tunneled hemodialysis catheter via the right internal jugular vein. The catheter tip lies in the right atrium. The catheter is ready for immediate use. Electronically Signed   By: Aletta Edouard M.D.   On: 02/17/2020 17:22   DG CHEST PORT 1 VIEW  Result Date: 02/25/2020 CLINICAL DATA:  Left-sided rib pain, back pain EXAM: PORTABLE CHEST 1 VIEW COMPARISON:  01/26/2020 FINDINGS: Single frontal view of the chest demonstrates a stable cardiac silhouette. Left lower lobe consolidation is seen within the retrocardiac region. No effusion or pneumothorax. Right internal jugular dialysis catheter tip overlies superior vena cava. Small lytic lesions are seen throughout the thoracic cage and left scapula. The lytic thoracic spine lesion seen on recent CT are more difficult to appreciate on portable x-ray. IMPRESSION: 1. Dense left lower lobe consolidation, favor atelectasis. 2. Diffuse lytic lesions throughout the visualized bony structures, consistent with multiple myeloma. Electronically Signed   By: Randa Ngo M.D.   On: 02/25/2020 17:52   CT BONE MARROW BIOPSY & ASPIRATION  Result Date: 02/19/2020 CLINICAL DATA:  Clinical suspicion multiple myeloma and need for bone marrow biopsy. EXAM: CT GUIDED BONE MARROW ASPIRATION AND BIOPSY ANESTHESIA/SEDATION: Versed 1.0 mg IV, Fentanyl 50 mcg IV Total Moderate Sedation Time:   14 minutes. The patient's level of consciousness and physiologic status were continuously monitored during the procedure by Radiology nursing. PROCEDURE: The procedure risks, benefits, and alternatives were explained  to the patient. Questions regarding the procedure were encouraged and answered. The patient understands and consents to the procedure. A time out was performed prior to initiating the procedure. The right gluteal region was prepped with chlorhexidine. Sterile gown and sterile gloves were used for the procedure. Local anesthesia was provided with 1% Lidocaine. Under CT guidance, an 11 gauge On Control bone cutting needle was advanced from a posterior approach into the right iliac bone. Needle positioning was confirmed with CT. Initial non heparinized and heparinized aspirate samples were obtained of bone marrow. Core biopsy was performed via the On Control drill needle. COMPLICATIONS: None FINDINGS: Inspection of initial aspirate did reveal visible particles. Intact core biopsy sample was obtained. IMPRESSION: CT guided bone marrow biopsy of right posterior iliac bone with both aspirate and core samples obtained. Electronically Signed   By: Aletta Edouard M.D.   On: 02/19/2020 12:21   VAS [place] LOWER EXTREMITY VENOUS (DVT)  Result Date: 03/03/2020  Lower Venous DVTStudy Indications: Immobility.  Performing  Technologist: Griffin Basil RCT RDMS  Examination Guidelines: A complete evaluation includes B-mode imaging, spectral Doppler, color Doppler, and power Doppler as needed of all accessible portions of each vessel. Bilateral testing is considered an integral part of a complete examination. Limited examinations for reoccurring indications may be performed as noted. The reflux portion of the exam is performed with the patient in reverse Trendelenburg.  +---------+---------------+---------+-----------+----------+--------------+ RIGHT    CompressibilityPhasicitySpontaneityPropertiesThrombus Aging +---------+---------------+---------+-----------+----------+--------------+ CFV      Full           Yes      Yes                                  +---------+---------------+---------+-----------+----------+--------------+ SFJ      Full                                                        +---------+---------------+---------+-----------+----------+--------------+ FV Prox  Full                                                        +---------+---------------+---------+-----------+----------+--------------+ FV Mid   Full                                                        +---------+---------------+---------+-----------+----------+--------------+ FV DistalFull                                                        +---------+---------------+---------+-----------+----------+--------------+ PFV      Full                                                        +---------+---------------+---------+-----------+----------+--------------+ POP      Full           Yes      Yes                                 +---------+---------------+---------+-----------+----------+--------------+ PTV      Full                                                        +---------+---------------+---------+-----------+----------+--------------+ PERO     Full                                                        +---------+---------------+---------+-----------+----------+--------------+   +---------+---------------+---------+-----------+----------+--------------+  LEFT     CompressibilityPhasicitySpontaneityPropertiesThrombus Aging +---------+---------------+---------+-----------+----------+--------------+ CFV      Full           Yes      Yes                                 +---------+---------------+---------+-----------+----------+--------------+ SFJ      Full                                                        +---------+---------------+---------+-----------+----------+--------------+ FV Prox  Full                                                         +---------+---------------+---------+-----------+----------+--------------+ FV Mid   Full                                                        +---------+---------------+---------+-----------+----------+--------------+ FV DistalFull                                                        +---------+---------------+---------+-----------+----------+--------------+ PFV      Full                                                        +---------+---------------+---------+-----------+----------+--------------+ POP      Full           Yes      Yes                                 +---------+---------------+---------+-----------+----------+--------------+ PTV      Full                                                        +---------+---------------+---------+-----------+----------+--------------+ PERO     Full                                                        +---------+---------------+---------+-----------+----------+--------------+     Summary: RIGHT: - There is no evidence of deep vein thrombosis in the lower extremity.  - No cystic structure found in the popliteal fossa.  LEFT: - There is no evidence of deep vein thrombosis in the lower extremity.  - No  cystic structure found in the popliteal fossa.  *See table(s) above for measurements and observations. Electronically signed by Monica Martinez MD on 03/03/2020 at 3:26:05 PM.    Final     ASSESSMENT & PLAN Tyronn Golda 54 y.o. male with medical history significant for HTN and HLD who presented with back pain and was found to have cord compression and lytic lesions concerning for multiple myeloma. The patient's imaging and blood work are most concerning for multiple myeloma.  Interestingly the M spike and the serum free light chain ratio is relatively modest compared to the extent of the damage seen on CT scan.  The patient had significant hypercalcemia upon presentation. Bone marrow biopsy was performed on 02/19/2020  and was consistent with a plasma cell neoplasm.  Cytogenetics were normal.  He received radiation for impending cord compression which was completed on 03/02/2020.   Recommend CyBorD chemotherapy.  Since the patient will remain in inpatient rehab until about 03/23/2020, we will go ahead and initiate chemotherapy starting Tuesday, 03/15/2020.  While inpatient, will administer subcutaneous Velcade and dexamethasone and will hold off on Cytoxan until after he is discharged.  SPEP 02/15/2020: M protein 0.9 SFLC: kappa 37.5, Lambda 9.9, ratio 3.79 UPEP: 02/17/2020: M spike not observed.  Immunofixation showed IgG monoclonal protein with kappa light chain specificity   # Lytic Lesions Througout Skeleton/Hypercalcemia # Pathologic Fractures of Spine/Soft Tissue Masses in Spine #Concern for Multiple Myeloma --bone marrow biopsy performed 02/19/2020 to confirm diagnosis; results consistent with plasma cell neoplasm.  Normal cytogenetics. --M protein and Kappa ratio are modestly elevated compared the the extent of damage seen on CT/MRI. 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  We will continue to closely monitor and consider additional bisphosphonate therapy as an outpatient. --The patient has completed radiation to the impending cord compression.  He is still on dexamethasone 4 mg every 8 hours.  Discussed with radiation oncology and will begin to taper.  Recommended taper is for dexamethasone 4 mg every 12 hours x1 week then 2 mg every 12 hours x1 week, and then 2 mg daily for 1 week, and then 2 mg every other day x2 weeks and then stop.  I note that he is having hypoglycemic episodes and hopefully tapering his dexamethasone will help with that. --Recommend for the patient to proceed with CyBorD chemotherapy.  Given his continued hospitalization, we will plan  to administer the first dose of Velcade on 03/15/2020.  We will hold on Cytoxan until after he is discharged. --We again discussed his diagnosis and treatment recommendation.  Discussed the side effects of Velcade including but not limited to diarrhea and peripheral neuropathy.  The patient agrees to proceed and has signed an informed consent which was placed on a shadow chart.  IV team to administer Velcade on 03/15/2020. --The patient will have repeat lab work including a CBC with differential, CMET, and LDH the day prior to chemotherapy. --He will start acyclovir 400 mg daily on 03/15/2020. --We will give dexamethasone 40 mg once a week starting on 03/15/2020.  On the days that he receives 40 mg of dexamethasone, we will hold his routine dexamethasone dosing.  The patient's wife requested a letter excusing him from a fingerprinting appointment which was scheduled for today.  I have written this letter and gave this to the patient's wife.   Mikey Bussing, DNP, AGPCNP-BC, AOCNP Mon/Tues/Thurs/Fri 7am-5pm; Off  Wednesdays Cell: [phone]  03/11/2020 1:07 PM

## 2020-03-11 NOTE — Progress Notes (Signed)
Occupational Therapy Session Note  Patient Details  Name: Trevor Shelton MRN: [id number] Date of Birth: [birth year]  Today's Date: 03/11/2020 OT Individual Time: 4332-9518 OT Individual Time Calculation (min): 55 min    Short Term Goals: Week 2:  OT Short Term Goal 1 (Week 2): Pt will be able to push to stand with a RW with max A. OT Short Term Goal 2 (Week 2): Pt will perform squat pivot transfers with mod A OT Short Term Goal 3 (Week 2): Pt will perform toileting tasks with mod A (2/3)  Skilled Therapeutic Interventions/Progress Updates:    OT intervention with focus on bed mobility, squat pivot transfers, sitting balance, sit<>stand in [place], standing balance, and activity tolerance to increase independence with BADLs. Supine>sit EOB with supervision.  Squat pivot transfer bed >w/c with max A and max verbal cues for sequencing.  Pt propelled to gym and performed squat pivot transfer to mat (max A). Pt initially engaged in sitting activity with [employer] with min A for sitting balance.  Pt with no c/o BUE discomfort.  Pt also engaged in boxing activity with heavy bag-min A for sitting balance. Pt engaged in sit<>stand in [place] X 4.  While standing, pt challenged to reach for cup with R and L UE while maintaing standing balance. Pt completed activity X 4. Pt commented that this was the most exhausting activity. Pt returned to room and transferred to toilet/BSC with [place]. Pt remained on BSC with NT present.   Therapy Documentation Precautions:  Precautions Precautions: Back, Fall (per chart review, no specific back precautions however encouraged per pt comfort) Precaution Comments: Significant BLE weakness and ataxia-like movements (LLE>RLE); back and rib precautions for comfort due to fxs Restrictions Weight Bearing Restrictions: No   Pain:  Pt initially c/o R shoulder pain but improved with activity   Therapy/Group: Individual Therapy  Leroy Libman 03/11/2020, 2:25 PM

## 2020-03-11 NOTE — Progress Notes (Signed)
[place] PHYSICAL MEDICINE & REHABILITATION PROGRESS NOTE   Subjective/Complaints:  Pt reports feels dizzy and has headache and blurred vision every morning he wakes up- would "like a medicine for that".  Explained it could be due to orthostatic hypotension- I.e. low BP- so will try Midodrine dose in AM.     ROS:  Pt denies SOB, abd pain, CP, N/V/C/D, and vision changes  Objective:   DG Abd 1 View  Result Date: 03/10/2020 CLINICAL DATA:  Abdominal pain. EXAM: ABDOMEN - 1 VIEW COMPARISON:  Radiograph 03/05/2020. FINDINGS: Mild gaseous distention of large and small bowel in a nonobstructive pattern. Small to moderate stool in the right colon, with diminished stool burden from prior exam. There is no evidence of free intra-abdominal air. No radiopaque calculi. No osseous abnormalities are seen. IMPRESSION: Bowel-gas pattern suggestive of ileus, with slight improvement in gaseous bowel distension over the past few days. Electronically Signed   By: Keith Rake M.D.   On: 03/10/2020 14:13   Recent Labs    03/09/20 0735  WBC 5.0  HGB 10.3*  HCT 30.1*  PLT 92*   Recent Labs    03/09/20 0735  NA 124*  K 4.4  CL 95*  CO2 19*  GLUCOSE 263*  BUN 43*  CREATININE 1.16  CALCIUM 9.0    Intake/Output Summary (Last 24 hours) at 03/11/2020 0851 Last data filed at 03/11/2020 0802 Gross per 24 hour  Intake 699 ml  Output 2120 ml  Net -1421 ml     Physical Exam: Vital Signs Blood pressure 117/64, pulse 83, temperature 98.4 F (36.9 C), resp. rate 18, height '5\' 3"'  (1.6 m), weight 60.9 kg, SpO2 98 %. General: sitting up in bed- OT in room; appropriate, co dizziness/and HA, NAD HEENT: conjugate gaze Heart: RRR Chest: CTA B/L- no W/R/R- good air movement TTP over ribs Abdomen: abd less distended than yesterday- soft, mild TTP; normoactive BS    Musculoskeletal:     Cervical back: Neck supple. No rigidity.     Comments: UEs 5/5 in deltoid, biceps, triceps, WE, grip and finger  abd B/L- pain with biceps testing due to rib fx's LEs- RLE- HF 3-/5, KE 3+/5, DF 4-/5, PF 5-/5 LLE- HF 2.5, KE 3/5, DF 2/5, PF 5-/5  TTP over entire R shoulder- not specific to anterior/posterior or AC joint-  Skin:    General: Skin is warm and dry.  Large purple bruise on sacrum- a tiny puncture mark under it- healing well B/L knee brown squishy bruises- improving/healing No LE edema anymore 2 radiation tags- chest and R side  Neurological: Ox3.   Light touch intact in all 4 extremities B/L Psychiatric:  sad/flat affect    Assessment/Plan: 1. 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  Physiatrist is providing close team supervision and 24 hour management of active medical problems listed below.  Physiatrist and rehab team continue to assess barriers to discharge/monitor patient progress toward functional and medical goals  Care Tool:  Bathing    Body parts bathed by patient: Right arm, Left arm, Chest, Abdomen, Front perineal area, Right upper leg, Left upper leg, Right lower leg, Left lower leg, Face   Body parts bathed by helper: Buttocks     Bathing assist Assist Level: Moderate Assistance - Patient 50 - 74%     Upper Body Dressing/Undressing Upper body dressing   What  is the patient wearing?: Pull over shirt    Upper body assist Assist Level: Set up assist    Lower Body Dressing/Undressing Lower body dressing      What is the patient wearing?: Pants     Lower body assist Assist for lower body dressing: Moderate Assistance - Patient 50 - 74%     Toileting Toileting    Toileting assist Assist for toileting: Maximal Assistance - Patient 25 - 49% Assistive Device Comment: urinal   Transfers Chair/bed transfer  Transfers assist     Chair/bed transfer assist level: Moderate Assistance -  Patient 50 - 74%     Locomotion Ambulation   Ambulation assist   Ambulation activity did not occur: Safety/medical concerns          Walk 10 feet activity   Assist  Walk 10 feet activity did not occur: Safety/medical concerns        Walk 50 feet activity   Assist Walk 50 feet with 2 turns activity did not occur: Safety/medical concerns         Walk 150 feet activity   Assist Walk 150 feet activity did not occur: Safety/medical concerns         Walk 10 feet on uneven surface  activity   Assist Walk 10 feet on uneven surfaces activity did not occur: Safety/medical concerns         Wheelchair     Assist Will patient use wheelchair at discharge?: Yes Type of Wheelchair: Manual    Wheelchair assist level: Supervision/Verbal cueing Max wheelchair distance: 150'    Wheelchair 50 feet with 2 turns activity    Assist        Assist Level: Supervision/Verbal cueing   Wheelchair 150 feet activity     Assist      Assist Level: Supervision/Verbal cueing   Blood pressure 117/64, pulse 83, temperature 98.4 F (36.9 C), resp. rate 18, height '5\' 3"'  (1.6 m), weight 60.9 kg, SpO2 98 %.   Medical Problem List and Plan: 1.  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  Antithrombotics: -DVT/anticoagulation:  Pharmaceutical: Lovenox   8/27- at high risk for DVT due to SCI and cancer being treated             --BLE dopplers negative for DVT             -antiplatelet therapy: N/a 3. Pain Management: Continue oxycodone --increase to 10 mg q 4 hrs prn--d/c IV dilaudid.  8/29 pain is stable  8/30- pt said not asking regularly for pain meds  9/1- pt still having pain- encouraged to take meds if need be. Explained why he still having pain.  9/2- will add  Lidocaine patch to R shoulder  4. Mood: LCSW to follow for evaluation and support.              -antipsychotic agents: N/a 5. Neuropsych: This patient is capable of making decisions on his own behalf. 6. Skin/Wound Care: Routine pressure relief measures.  7. Fluids/Electrolytes/Nutrition: Strict I/O. Check lytes in am.  8. Acute renal failure: Improving 81/4.92-->62/1.49  8/30- Cr 1.37 and BUN 50  8/31- Cr 1.25 and Bun down to 45- much better  9/1-  Cr 1.17 9. Hyponatremia: Recurrent hyponatremia. Improving to 124 on 8/28.  8/30- Na 127 today  9/1- Na 124- stable/OK per Nephrology due to high protein.   10. New diagnosis T2DM: Hgb A1c-6.5. BS poorly controlled due to steriods as well as Ensure tid between meals. Will change ensure to Ensure Max --continue Lantus 10 units at bedtime with 3 units meal coverage tid as well as SSI for elevated BS.    CBG (last 3)  Recent Labs    03/10/20 1632 03/10/20 2105 03/11/20 0627  GLUCAP 285* 265* 266*    9/1- will increase Lantus to 16 units due to elevated BGs  9/2- will increase lantus again- to 18 units  9/3- will wait til tomorrow to increase Lantus 11. Constipation: Continue miralax am/change senna to lunch. KUB done showing ileus?  Wife/patient reports lot of gas and that he has been having BM daily. Pt says as well, BM daily.   8/27- adynamic ileus yesterday- will make sure pt has BM and recheck KUB in AM  8/29: KUB reviewed and shows moderate stool burden- he is having BM now  9/3- still has ileus gas pattern- will give Mg citrate to get him cleaned out this afternoon 12. Mild BPH: will monitor voiding with PVR checks.  13. 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 Pathologic Fx T6 and T9:  Treated with XRT 10/10 and high dose steroids.  15. L foot drop- get prevalon boot on L at night  9/1- told wasn't in room- will order another PRAFO  16. Hyponatremia/Hyperkalemia  8/27- Na 122 this AM- will call Nephrology, esp because K+  4.8 and was on Lokelma BID- changed to daily and held dose this AM.  8/3-- can stop Lokelma per nephrology and Na up to 127- doing better-   17. Hiccups- for 1 week  8/27- on Thorazine- will add baclofen 10 mg TID prn for hiccups  8/30- will change to QID and schedule it- also asked nursing to give Thorazine when notices hiccups, if due- to try and help.   9/2- said hiccups much better 18. Thrombocytopenia  8/30- plts dropping- are 79k- if drop again by Wednesday, will need to see what else is possible other than Lovenox- will check with Heme/Onc.   8/31- labs in AM  9/1- Plts up to 92- doing better- will check 2x/week.  18. Dispo  8/30- pt/family want to have discussion Tuesday 1-1:30 pm to discuss medical issues- will try and do this.   9/1- no interpretor there- will do Thursday 19. Dizziness  9/1- - will encourage to push fluids and if doesn't fix, will order abd binder  9/3- vertigo eval by PT showed nothing- likely some orthostatic hypotension that we don't see/catch- will try midodrine early in AM 5 mg and see how he does.  20. N/V- vomited 3x this AM  9/2- could be chemo- could be meds- will add Zofran prn ; con't compazine; and alternate- ask nursing to give.  9/3 check on Chemo -with PA  21. GERD  9/2- will increase PPI to BID 22. Insomnia  9/2- increase trazodone to 100 mg QHS  9/3- slept better     LOS: 8 days A FACE TO FACE EVALUATION WAS PERFORMED  Trevor Shelton 03/11/2020, 8:51 AM

## 2020-03-12 LAB — GLUCOSE, CAPILLARY
Glucose-Capillary: 281 mg/dL — ABNORMAL HIGH (ref 70–99)
Glucose-Capillary: 255 mg/dL — ABNORMAL HIGH (ref 70–99)
Glucose-Capillary: 240 mg/dL — ABNORMAL HIGH (ref 70–99)
Glucose-Capillary: 267 mg/dL — ABNORMAL HIGH (ref 70–99)

## 2020-03-12 MED ORDER — MIRTAZAPINE 15 MG PO TABS
15.0000 mg | ORAL_TABLET | Freq: Every day | ORAL | Status: DC
  Administered 2020-03-12 – 2020-03-13 (×2): 15 mg via ORAL
  Filled 2020-03-12 (×2): qty 1

## 2020-03-12 MED ORDER — INSULIN GLARGINE 100 UNIT/ML ~~LOC~~ SOLN
22.0000 [IU] | Freq: Every day | SUBCUTANEOUS | Status: DC
  Administered 2020-03-12 – 2020-03-13 (×2): 22 [IU] via SUBCUTANEOUS
  Filled 2020-03-12 (×3): qty 0.22

## 2020-03-12 MED ORDER — FLEET ENEMA 7-19 GM/118ML RE ENEM
1.0000 | ENEMA | Freq: Once | RECTAL | Status: AC
  Administered 2020-03-12: 1 via RECTAL
  Filled 2020-03-12: qty 1

## 2020-03-12 MED ORDER — SORBITOL 70 % SOLN
30.0000 mL | Freq: Every day | Status: DC | PRN
  Administered 2020-03-12: 30 mL via ORAL
  Filled 2020-03-12: qty 30

## 2020-03-12 MED ORDER — TRAZODONE HCL 50 MG PO TABS
100.0000 mg | ORAL_TABLET | Freq: Every evening | ORAL | Status: DC | PRN
  Administered 2020-03-12 – 2020-03-13 (×2): 100 mg via ORAL
  Filled 2020-03-12 (×2): qty 2

## 2020-03-12 NOTE — Progress Notes (Signed)
[place] PHYSICAL MEDICINE & REHABILITATION PROGRESS NOTE   Subjective/Complaints:  Used tele-interpretor Pt reports didn't sleep last night- also has poor appetite- wants something for that.   Also nauseated (had LBM yesterday), but likely still has ileus.   Also c/o little HA again this AM- dizziness slightly better with midodrine.  Per RN, pt had another large BM this AM before enema- if still nauseated or abd distended, still needs enema.      ROS:  Pt denies SOB, abd pain, CP, N/V/C/D, and vision changes   Objective:   No results found. No results for input(s): WBC, HGB, HCT, PLT in the last 72 hours. No results for input(s): NA, K, CL, CO2, GLUCOSE, BUN, CREATININE, CALCIUM in the last 72 hours.  Intake/Output Summary (Last 24 hours) at 03/12/2020 1514 Last data filed at 03/12/2020 1311 Gross per 24 hour  Intake 744 ml  Output 2300 ml  Net -1556 ml     Physical Exam: Vital Signs Blood pressure 133/81, pulse 86, temperature 98.1 F (36.7 C), resp. rate 17, height '5\' 3"'  (1.6 m), weight 60.9 kg, SpO2 99 %. General: sitting up in bed- slightly better color, appropriate, NAD HEENT: conjugate gaze Heart: RRR Chest: CTA B/L- no W/R/R- good air movement TTP over ribs Abdomen: abd still somewhat distended, slightly TTP- nauseated; hypoactive BS   Musculoskeletal:     Cervical back: Neck supple. No rigidity.     Comments: UEs 5/5 in deltoid, biceps, triceps, WE, grip and finger abd B/L- pain with biceps testing due to rib fx's LEs- RLE- HF 3-/5, KE 3+/5, DF 4-/5, PF 5-/5 LLE- HF 2.5, KE 3/5, DF 2/5, PF 5-/5  TTP over entire R shoulder- not specific to anterior/posterior or AC joint-  Skin:    General: Skin is warm and dry.  Large purple bruise on sacrum- a tiny puncture mark under it- healing well B/L knee brown squishy bruises- improving/healing No LE edema anymore 2 radiation tags- chest and R side  Neurological: Ox3.   Light touch intact in all 4 extremities  B/L Psychiatric:  sad affect    Assessment/Plan: 1. 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  Physiatrist is providing close team supervision and 24 hour management of active medical problems listed below.  Physiatrist and rehab team continue to assess barriers to discharge/monitor patient progress toward functional and medical goals  Care Tool:  Bathing    Body parts bathed by patient: Right arm, Left arm, Chest, Abdomen, Front perineal area, Right upper leg, Left upper leg, Right lower leg, Left lower leg, Face   Body parts bathed by helper: Buttocks     Bathing assist Assist Level: Moderate Assistance - Patient 50 - 74%     Upper Body Dressing/Undressing Upper body dressing   What is the patient wearing?: Pull over shirt    Upper body assist Assist Level: Set up assist    Lower Body Dressing/Undressing Lower body dressing      What is the patient wearing?: Pants     Lower body assist Assist for lower body dressing: Moderate Assistance - Patient 50 - 74%     Toileting Toileting    Toileting assist Assist for toileting: Maximal Assistance - Patient 25 - 49% Assistive Device Comment: urinal   Transfers Chair/bed transfer  Transfers assist     Chair/bed transfer assist level: Moderate Assistance -  Patient 50 - 74%     Locomotion Ambulation   Ambulation assist   Ambulation activity did not occur: Safety/medical concerns          Walk 10 feet activity   Assist  Walk 10 feet activity did not occur: Safety/medical concerns        Walk 50 feet activity   Assist Walk 50 feet with 2 turns activity did not occur: Safety/medical concerns         Walk 150 feet activity   Assist Walk 150 feet activity did not occur: Safety/medical concerns         Walk 10 feet on uneven  surface  activity   Assist Walk 10 feet on uneven surfaces activity did not occur: Safety/medical concerns         Wheelchair     Assist Will patient use wheelchair at discharge?: Yes Type of Wheelchair: Manual    Wheelchair assist level: Independent Max wheelchair distance: 100    Wheelchair 50 feet with 2 turns activity    Assist        Assist Level: Independent   Wheelchair 150 feet activity     Assist      Assist Level: Supervision/Verbal cueing   Blood pressure 133/81, pulse 86, temperature 98.1 F (36.7 C), resp. rate 17, height '5\' 3"'  (1.6 m), weight 60.9 kg, SpO2 99 %.   Medical Problem List and Plan: 1.  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  Antithrombotics: -DVT/anticoagulation:  Pharmaceutical: Lovenox   8/27- at high risk for DVT due to SCI and cancer being treated             --BLE dopplers negative for DVT             -antiplatelet therapy: N/a 3. Pain Management: Continue oxycodone --increase to 10 mg q 4 hrs prn--d/c IV dilaudid.  8/29 pain is stable  8/30- pt said not asking regularly for pain meds  9/1- pt still having pain- encouraged to take meds if need be. Explained why he still having pain.  9/2- will add Lidocaine patch to R shoulder  4. Mood: LCSW to follow for evaluation and support.              -antipsychotic agents: N/a 5. Neuropsych: This patient is capable of making decisions on his own behalf. 6. Skin/Wound Care: Routine pressure relief measures.  7. Fluids/Electrolytes/Nutrition: Strict I/O. Check lytes in am.  8. Acute renal failure: Improving 81/4.92-->62/1.49  8/30- Cr 1.37 and BUN 50  8/31- Cr 1.25 and Bun down to 45- much better  9/1- Cr 1.17  9/4- labs Monday 9. Hyponatremia: Recurrent hyponatremia. Improving to 124 on  8/28.  8/30- Na 127 today  9/1- Na 124- stable/OK per Nephrology due to high protein.   10. New diagnosis T2DM: Hgb A1c-6.5. BS poorly controlled due to steriods as well as Ensure tid between meals. Will change ensure to Ensure Max --continue Lantus 10 units at bedtime with 3 units meal coverage tid as well as SSI for elevated BS.    CBG (last 3)  Recent Labs    03/11/20 2130 03/12/20 0557 03/12/20 1141  GLUCAP 306* 281* 255*  9/1- will increase Lantus to 16 units due to elevated BGs  9/2- will increase lantus again- to 18 units  9/3- will wait til tomorrow to increase Lantus  9/4- BGs keep going UP no matter how much increase Lantus- and pt said not eating a lot- will increase Lantus again.  11. Constipation: Continue miralax am/change senna to lunch. KUB done showing ileus?  Wife/patient reports lot of gas and that he has been having BM daily. Pt says as well, BM daily.   8/27- adynamic ileus yesterday- will make sure pt has BM and recheck KUB in AM  8/29: KUB reviewed and shows moderate stool burden- he is having BM now  9/3- still has ileus gas pattern- will give Mg citrate to get him cleaned out this afternoon  9/4- Large BM yesterday and this AM- will order enema just in case 12. Mild BPH: will monitor voiding with PVR checks.  13. 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 Pathologic Fx T6 and T9:  Treated with XRT 10/10 and high dose steroids.  15. L foot drop- get prevalon boot on L at night  9/1- told wasn't in room- will order another PRAFO  16. Hyponatremia/Hyperkalemia  8/27- Na 122 this AM- will call Nephrology, esp because K+ 4.8 and was on Lokelma BID- changed to daily and held dose this AM.  8/3-- can stop Lokelma per nephrology and Na up to 127- doing better-   17. Hiccups- for 1 week  8/27- on Thorazine- will add baclofen 10 mg TID prn for hiccups  8/30- will change to QID and schedule it- also asked nursing to give Thorazine when notices hiccups, if due-  to try and help.   9/2- said hiccups much better 18. Thrombocytopenia  8/30- plts dropping- are 79k- if drop again by Wednesday, will need to see what else is possible other than Lovenox- will check with Heme/Onc.   8/31- labs in AM  9/1- Plts up to 92- doing better- will check 2x/week.  18. Dispo  8/30- pt/family want to have discussion Tuesday 1-1:30 pm to discuss medical issues- will try and do this.   9/1- no interpretor there- will do Thursday 19. Dizziness  9/1- - will encourage to push fluids and if doesn't fix, will order abd binder  9/3- vertigo eval by PT showed nothing- likely some orthostatic hypotension that we don't see/catch- will try midodrine early in AM 5 mg and see how he does.   9/4- slightly better 20. N/V- vomited 3x this AM  9/2- could be chemo- could be meds- will add Zofran prn ; con't compazine; and alternate- ask nursing to give.  9/3 check on Chemo -with PA   9/4- to start Chemo Tuesday- from ileus 21. GERD  9/2- will increase PPI to BID 22. Insomnia  9/2- increase trazodone to 100 mg QHS  9/3- slept better  9/4- slept poorly again- will add remeron 15 mg QHS and make trazodone prn- likely due to depression with poor appetite, etc.      LOS: 9 days A FACE TO FACE EVALUATION WAS PERFORMED  Jan Olano 03/12/2020, 3:14 PM

## 2020-03-12 NOTE — Progress Notes (Signed)
I reviewed the assessment charted by the student and agree with her assessment

## 2020-03-12 NOTE — Progress Notes (Signed)
Patient had large bowel movement following fleet enema

## 2020-03-12 NOTE — Plan of Care (Signed)
  Problem: Consults Goal: RH SPINAL CORD INJURY PATIENT EDUCATION Description:  See Patient Education module for education specifics.  Outcome: Progressing Goal: Skin Care Protocol Initiated - if Braden Score 18 or less Description: If consults are not indicated, leave blank or document N/A Outcome: Progressing Goal: Diabetes Guidelines if Diabetic/Glucose > 140 Description: If diabetic or lab glucose is > 140 mg/dl - Initiate Diabetes/Hyperglycemia Guidelines & Document Interventions  Outcome: Progressing   Problem: SCI BOWEL ELIMINATION Goal: RH STG MANAGE BOWEL WITH ASSISTANCE Description: STG Manage Bowel with mod Assistance. Outcome: Progressing Goal: RH STG MANAGE BOWEL W/EQUIPMENT W/ASSISTANCE Description: STG Manage Bowel With Equipment With mod Assistance Outcome: Progressing Goal: RH STG SCI MANAGE BOWEL PROGRAM W/ASSIST OR AS APPROPRIATE Description: STG SCI Manage bowel program w/mo assist or as appropriate. Outcome: Progressing   Problem: SCI BLADDER ELIMINATION Goal: RH STG MANAGE BLADDER WITH ASSISTANCE Description: STG Manage Bladder With min Assistance Outcome: Progressing Goal: RH STG MANAGE BLADDER WITH MEDICATION WITH ASSISTANCE Description: STG Manage Bladder With Medication With min Assistance. Outcome: Progressing   Problem: RH SKIN INTEGRITY Goal: RH STG SKIN FREE OF INFECTION/BREAKDOWN Description: Skin will be free of infection/breakdown with min assist Outcome: Progressing Goal: RH STG MAINTAIN SKIN INTEGRITY WITH ASSISTANCE Description: STG Maintain Skin Integrity With min Assistance. Outcome: Progressing   Problem: RH SAFETY Goal: RH STG ADHERE TO SAFETY PRECAUTIONS W/ASSISTANCE/DEVICE Description: STG Adhere to Safety Precautions With min Assistance/Device. Outcome: Progressing   Problem: RH PAIN MANAGEMENT Goal: RH STG PAIN MANAGED AT OR BELOW PT'S PAIN GOAL Description: Pain will be managed at or below 2 out of 10 with min assist Outcome:  Progressing   Problem: RH KNOWLEDGE DEFICIT SCI Goal: RH STG INCREASE KNOWLEDGE OF SELF CARE AFTER SCI Outcome: Progressing

## 2020-03-13 ENCOUNTER — Inpatient Hospital Stay (HOSPITAL_COMMUNITY): Payer: Self-pay

## 2020-03-13 LAB — GLUCOSE, CAPILLARY
Glucose-Capillary: 224 mg/dL — ABNORMAL HIGH (ref 70–99)
Glucose-Capillary: 230 mg/dL — ABNORMAL HIGH (ref 70–99)
Glucose-Capillary: 297 mg/dL — ABNORMAL HIGH (ref 70–99)
Glucose-Capillary: 297 mg/dL — ABNORMAL HIGH (ref 70–99)

## 2020-03-13 MED ORDER — MIDODRINE HCL 5 MG PO TABS
7.5000 mg | ORAL_TABLET | Freq: Two times a day (BID) | ORAL | Status: DC
  Administered 2020-03-14 – 2020-03-19 (×12): 7.5 mg via ORAL
  Filled 2020-03-13 (×12): qty 2

## 2020-03-13 MED ORDER — OXYMETAZOLINE HCL 0.05 % NA SOLN
1.0000 | Freq: Two times a day (BID) | NASAL | Status: DC | PRN
  Administered 2020-03-13: 1 via NASAL
  Filled 2020-03-13: qty 30

## 2020-03-13 MED ORDER — SENNOSIDES-DOCUSATE SODIUM 8.6-50 MG PO TABS
2.0000 | ORAL_TABLET | Freq: Two times a day (BID) | ORAL | Status: DC
  Administered 2020-03-13 – 2020-03-19 (×12): 2 via ORAL
  Filled 2020-03-13 (×12): qty 2

## 2020-03-13 MED ORDER — SORBITOL 70 % SOLN
30.0000 mL | Freq: Once | Status: AC
  Administered 2020-03-13: 30 mL via ORAL
  Filled 2020-03-13: qty 30

## 2020-03-13 MED ORDER — SALINE SPRAY 0.65 % NA SOLN
1.0000 | NASAL | Status: DC | PRN
  Filled 2020-03-13: qty 44

## 2020-03-13 NOTE — Progress Notes (Signed)
Physical Therapy Session Note  Patient Details  Name: Trevor Shelton MRN: [id number] Date of Birth: 1966-02-19  Today's Date: 03/13/2020 PT Individual Time: 1007-1105 PT Individual Time Calculation (min): 58 min   Short Term Goals: Week 2:  PT Short Term Goal 1 (Week 2): pt will roll independently using momentum strategy PT Short Term Goal 2 (Week 2): Pt will perform transfers w/LRAD w/min assist including uneven surfaces PT Short Term Goal 3 (Week 2): Pt will maintain static stand in stedy w/midline posture maintained x 30sec PT Short Term Goal 4 (Week 2): Pt will ascend/descend ramp w/wc w/min assist PT Short Term Goal 5 (Week 2): pt will perform side to sit on mat w/min assist  Skilled Therapeutic Interventions/Progress Updates:     Patient in bed upon PT arrival. Patient alert and agreeable to PT session. Live interpreter present throughout session. Patient with dressing over his nose due to recent nose bleed, per RN dressing could be removed at this time, removed dressing with no signs of bleeding throughout session. Patient reported 9/10 R UE pain and 6-7/10 nausea during session, RN made aware. PT provided repositioning, rest breaks, and distraction as pain interventions throughout session. Patient also reported dizziness throughout session that did not resolve and increased fatigue. See vitals below. Noted patient shaking in standing, rehab tech reported that this was different from previous days.   Orthostatic Vitals: Supine: BP 116/88, HR 92 Sitting : BP 125/82, HR 109 Standing: BP 143/101, HR 130 Sitting: BP 135/83, HR 109  No change in dizziness throughout positional changes, remained constant.  Therapeutic Activity: Bed Mobility: Patient performed supine to/from sit with CGA-min A of 1-2 people for safety due to dizziness/fatigue. Provided verbal cues for setting bottom elbow to push to sitting and bringing his trunk forward to come to sitting. Transfers: Patient performed sit  to/from stand x1 in the [employer] with min A of 2 people for safety. Provided verbal cues for forward weight shift, hip, knee, and trunk extension in standing, and reduced UE use due to tight grip and observable strain in UEs in standing. He maintained standing >1 min to obtain BP, provided cues for posture and deep breathing to reduce strain in standing.  Neuromuscular Re-ed: Patient performed the following sitting balance and LE motor control activities: -static sitting balance x5 min focused on erect posture and midline orientation due to mild posterior bias, progressed from CGA-supervision throughout -seated reaching to visual target outside of BOS with L UE, no R UE due to increased pain with reaching, 2x1 min focused on return to midline and increased core activation -B knee flexion/extension focused on controlled movement and increased hamstring activation 2x10 -seated B DF/PF progressing from no activation of L DF to >50% AROM DF x20 -hook-lying hip adduction/abduction 2x12 focused on controlled movement in a small range to prevent collapsing into abduction, noted improved motor control throughout -bridging 2x10 with feet blocked by PT focused on driving kneed forward to increase hip rise -B heel slides x5 focused on controlled movement, eccentric>concentric  Patient in bed at end of session with breaks locked, bed alarm set, and all needs within reach.    Therapy Documentation Precautions:  Precautions Precautions: Back, Fall (per chart review, no specific back precautions however encouraged per pt comfort) Precaution Comments: Significant BLE weakness and ataxia-like movements (LLE>RLE); back and rib precautions for comfort due to fxs Restrictions Weight Bearing Restrictions: No   Therapy/Group: Individual Therapy  Kayden Hutmacher L Zacari Radick PT, DPT  03/13/2020, 4:23 PM

## 2020-03-13 NOTE — Plan of Care (Signed)
  Problem: Consults Goal: RH SPINAL CORD INJURY PATIENT EDUCATION Description:  See Patient Education module for education specifics.  Outcome: Progressing Goal: Skin Care Protocol Initiated - if Braden Score 18 or less Description: If consults are not indicated, leave blank or document N/A Outcome: Progressing Goal: Diabetes Guidelines if Diabetic/Glucose > 140 Description: If diabetic or lab glucose is > 140 mg/dl - Initiate Diabetes/Hyperglycemia Guidelines & Document Interventions  Outcome: Progressing   Problem: SCI BOWEL ELIMINATION Goal: RH STG MANAGE BOWEL WITH ASSISTANCE Description: STG Manage Bowel with mod Assistance. Outcome: Progressing Goal: RH STG MANAGE BOWEL W/EQUIPMENT W/ASSISTANCE Description: STG Manage Bowel With Equipment With mod Assistance Outcome: Progressing Goal: RH STG SCI MANAGE BOWEL PROGRAM W/ASSIST OR AS APPROPRIATE Description: STG SCI Manage bowel program w/mo assist or as appropriate. Outcome: Progressing   Problem: SCI BLADDER ELIMINATION Goal: RH STG MANAGE BLADDER WITH ASSISTANCE Description: STG Manage Bladder With min Assistance Outcome: Progressing Goal: RH STG MANAGE BLADDER WITH MEDICATION WITH ASSISTANCE Description: STG Manage Bladder With Medication With min Assistance. Outcome: Progressing   Problem: RH SKIN INTEGRITY Goal: RH STG SKIN FREE OF INFECTION/BREAKDOWN Description: Skin will be free of infection/breakdown with min assist Outcome: Progressing Goal: RH STG MAINTAIN SKIN INTEGRITY WITH ASSISTANCE Description: STG Maintain Skin Integrity With min Assistance. Outcome: Progressing   Problem: RH SAFETY Goal: RH STG ADHERE TO SAFETY PRECAUTIONS W/ASSISTANCE/DEVICE Description: STG Adhere to Safety Precautions With min Assistance/Device. Outcome: Progressing   Problem: RH PAIN MANAGEMENT Goal: RH STG PAIN MANAGED AT OR BELOW PT'S PAIN GOAL Description: Pain will be managed at or below 2 out of 10 with min assist Outcome:  Progressing   Problem: RH KNOWLEDGE DEFICIT SCI Goal: RH STG INCREASE KNOWLEDGE OF SELF CARE AFTER SCI Outcome: Progressing

## 2020-03-13 NOTE — Progress Notes (Signed)
[facility] PHYSICAL MEDICINE & REHABILITATION PROGRESS NOTE   Subjective/Complaints:  Used tele-interpretor - pt doesn't like, but no interpretor available.   Had nosebleed last night and today- resolved both times- mild-moderate- ordered Afrin and saline nasal spray as needed.   Stomach distended again after 2 extralarge BMs yesterday, 1 after fleets enema.   Legs feel more weak Still nauseated- vomited x1.     ROS:  Pt denies SOB, abd pain, CP, N/V/C/D, and vision changes KUB ordered for today- showed moderate stool burden which is likely making pt ill- will increase bowel meds- needs another dose of Sorbitol as well.   R shoulder pain- said "pain isn't going away".  Explained it won't just go away- meds help treat it until it will finally heal/ improve.      Objective:   DG Abd 1 View  Result Date: 03/13/2020 CLINICAL DATA:  Nausea, vomiting. EXAM: ABDOMEN - 1 VIEW COMPARISON:  March 10, 2020. FINDINGS: The bowel gas pattern is normal. Moderate amount of stool seen in right colon. No radio-opaque calculi or other significant radiographic abnormality are seen. IMPRESSION: Moderate stool burden is noted. No evidence of bowel obstruction or ileus. Electronically Signed   By: Marijo Conception M.D.   On: 03/13/2020 11:31   No results for input(s): WBC, HGB, HCT, PLT in the last 72 hours. No results for input(s): NA, K, CL, CO2, GLUCOSE, BUN, CREATININE, CALCIUM in the last 72 hours.  Intake/Output Summary (Last 24 hours) at 03/13/2020 1507 Last data filed at 03/13/2020 1339 Gross per 24 hour  Intake 684 ml  Output 1800 ml  Net -1116 ml     Physical Exam: Vital Signs Blood pressure 134/76, pulse 99, temperature 98.6 F (37 C), resp. rate 17, height '5\' 3"'  (1.6 m), weight 60.9 kg, SpO2 98 %. General: sitting up in bed- appropriate, No acute distress HEENT: conjugate gaze- lips dry- with wound on lower lip Heart: RRR Chest: CTA B/L- no W/R/R- good air movement TTP over  ribs Abdomen: abd still very distended- if anything, more than yesterday (KUB mod stool burden)- TTP makes him nauseated- hypoactive BS   Musculoskeletal: -rubbing R shoulder- TTP over RTC and upper traps/rhomboids    Cervical back: Neck supple. No rigidity.     Comments: UEs 5/5 in deltoid, biceps, triceps, WE, grip and finger abd B/L- pain with biceps testing due to rib fx's LEs- RLE- HF 3-/5, KE 3+/5, DF 4-/5, PF 5-/5 LLE- HF 2.5, KE 3/5, DF 2/5, PF 5-/5  Skin:    General: Skin is warm and dry.  Large purple bruise on sacrum- a tiny puncture mark under it- healing well B/L knee brown squishy bruises- improving/healing No LE edema anymore 2 radiation tags- chest and R side  Neurological: Ox3.   Light touch intact in all 4 extremities B/L Psychiatric:  sad, upset affect    Assessment/Plan: 1. 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  Physiatrist is providing close team supervision and 24 hour management of active medical problems listed below.  Physiatrist and rehab team continue to assess barriers to discharge/monitor patient progress toward functional and medical goals  Care Tool:  Bathing    Body parts bathed by patient: Right arm, Left arm, Chest, Abdomen, Front perineal area, Right upper leg, Left upper leg, Right lower leg, Left lower leg, Face   Body parts bathed  by helper: Buttocks     Bathing assist Assist Level: Moderate Assistance - Patient 50 - 74%     Upper Body Dressing/Undressing Upper body dressing   What is the patient wearing?: Pull over shirt    Upper body assist Assist Level: Set up assist    Lower Body Dressing/Undressing Lower body dressing      What is the patient wearing?: Pants     Lower body assist Assist for lower body dressing: Moderate Assistance - Patient 50 - 74%      Toileting Toileting    Toileting assist Assist for toileting: Maximal Assistance - Patient 25 - 49% Assistive Device Comment: urinal   Transfers Chair/bed transfer  Transfers assist     Chair/bed transfer assist level: Moderate Assistance - Patient 50 - 74%     Locomotion Ambulation   Ambulation assist   Ambulation activity did not occur: Safety/medical concerns          Walk 10 feet activity   Assist  Walk 10 feet activity did not occur: Safety/medical concerns        Walk 50 feet activity   Assist Walk 50 feet with 2 turns activity did not occur: Safety/medical concerns         Walk 150 feet activity   Assist Walk 150 feet activity did not occur: Safety/medical concerns         Walk 10 feet on uneven surface  activity   Assist Walk 10 feet on uneven surfaces activity did not occur: Safety/medical concerns         Wheelchair     Assist Will patient use wheelchair at discharge?: Yes Type of Wheelchair: Manual    Wheelchair assist level: Independent Max wheelchair distance: 100    Wheelchair 50 feet with 2 turns activity    Assist        Assist Level: Independent   Wheelchair 150 feet activity     Assist      Assist Level: Supervision/Verbal cueing   Blood pressure 134/76, pulse 99, temperature 98.6 F (37 C), resp. rate 17, height '5\' 3"'  (1.6 m), weight 60.9 kg, SpO2 98 %.   Medical Problem List and Plan: 1.  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  Antithrombotics: -DVT/anticoagulation:  Pharmaceutical: Lovenox   8/27- at high risk for DVT due to SCI and cancer being treated             --BLE dopplers negative for DVT             -antiplatelet therapy: N/a 3. Pain Management: Continue oxycodone --increase to 10 mg q 4  hrs prn--d/c IV dilaudid.  8/29 pain is stable  8/30- pt said not asking regularly for pain meds  9/1- pt still having pain- encouraged to take meds if need be. Explained why he still having pain.  9/2- will add Lidocaine patch to R shoulder  4. Mood: LCSW to follow for evaluation and support.              -antipsychotic agents: N/a 5. Neuropsych: This patient is capable of making decisions on his own behalf. 6. Skin/Wound Care: Routine pressure relief measures.  7. Fluids/Electrolytes/Nutrition: Strict I/O. Check lytes  in am.  8. Acute renal failure: Improving 81/4.92-->62/1.49  8/30- Cr 1.37 and BUN 50  8/31- Cr 1.25 and Bun down to 45- much better  9/1- Cr 1.17  9/4- labs Monday 9. Hyponatremia: Recurrent hyponatremia. Improving to 124 on 8/28.  8/30- Na 127 today  9/1- Na 124- stable/OK per Nephrology due to high protein.   10. New diagnosis T2DM: Hgb A1c-6.5. BS poorly controlled due to steriods as well as Ensure tid between meals. Will change ensure to Ensure Max --continue Lantus 10 units at bedtime with 3 units meal coverage tid as well as SSI for elevated BS.    CBG (last 3)  Recent Labs    03/12/20 2044 03/13/20 0550 03/13/20 1133  GLUCAP 267* 224* 230*    9/1- will increase Lantus to 16 units due to elevated BGs  9/2- will increase lantus again- to 18 units  9/3- will wait til tomorrow to increase Lantus  9/4- BGs keep going UP no matter how much increase Lantus- and pt said not eating a lot- will increase Lantus again.  9/5- increased to 22 units yesterday- in spite of 4 units increase, no improvement in BGs- will wait til tomorrow to increase  11. Constipation: Continue miralax am/change senna to lunch. KUB done showing ileus?  Wife/patient reports lot of gas and that he has been having BM daily. Pt says as well, BM daily.   8/27- adynamic ileus yesterday- will make sure pt has BM and recheck KUB in AM  8/29: KUB reviewed and shows moderate stool burden- he is having  BM now  9/3- still has ileus gas pattern- will give Mg citrate to get him cleaned out this afternoon  9/4- Large BM yesterday and this AM- will order enema just in case  9/5- 2 extra large BMs yesterday- will order Sorbitol because still nauseated AND abd distended- KUB shows moderate stool burden- if no BM, might need another fleets after sorbitol 12. Mild BPH: will monitor voiding with PVR checks.  13. 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 Pathologic Fx T6 and T9:  Treated with XRT 10/10 and high dose steroids.  15. L foot drop- get prevalon boot on L at night  9/1- told wasn't in room- will order another PRAFO  16. Hyponatremia/Hyperkalemia  8/27- Na 122 this AM- will call Nephrology, esp because K+ 4.8 and was on Lokelma BID- changed to daily and held dose this AM.  8/3-- can stop Lokelma per nephrology and Na up to 127- doing better-   17. Hiccups- for 1 week  8/27- on Thorazine- will add baclofen 10 mg TID prn for hiccups  8/30- will change to QID and schedule it- also asked nursing to give Thorazine when notices hiccups, if due- to try and help.   9/2- said hiccups much better  9/5- hiccups coming back intermittently again- con't regimen 18. Thrombocytopenia  8/30- plts dropping- are 79k- if drop again by Wednesday, will need to see what else is possible other than Lovenox- will check with Heme/Onc.   8/31- labs in AM  9/1- Plts up to 92- doing better- will check 2x/week.  18. Dispo  8/30- pt/family want to have discussion Tuesday 1-1:30 pm to discuss medical issues- will try and do this.   9/1- no interpretor there- will do Thursday 19. Dizziness  9/1- - will encourage to push fluids and if doesn't fix, will order abd binder  9/3- vertigo eval by PT showed nothing- likely some orthostatic hypotension that  we don't see/catch- will try midodrine early in AM 5 mg and see how he does.   9/4- slightly better  9/5- will increase midodrine to 7.5 mg BID with breakfast and  lunch- had an episode of orthostasis with therapy today- this AM-  20. N/V- vomited 3x this AM  9/2- could be chemo- could be meds- will add Zofran prn ; con't compazine; and alternate- ask nursing to give.  9/3 check on Chemo -with PA   9/4- to start Chemo Tuesday- from ileus 21. GERD  9/2- will increase PPI to BID 22. Insomnia  9/2- increase trazodone to 100 mg QHS  9/3- slept better  9/4- slept poorly again- will add remeron 15 mg QHS and make trazodone prn- likely due to depression with poor appetite, etc.  23. Nose bleeds-  9/5- order Afrin prn and saline nasal spray.     I spent a total of 40 minutes on total care for pt today- seeing him with teleinterpretor, writing note and doing all orders and speaking to nurses x2 about his care.    LOS: 10 days A FACE TO FACE EVALUATION WAS PERFORMED  Citlalic Norlander 03/13/2020, 3:07 PM

## 2020-03-14 ENCOUNTER — Inpatient Hospital Stay (HOSPITAL_COMMUNITY): Payer: Self-pay | Admitting: Physical Therapy

## 2020-03-14 ENCOUNTER — Inpatient Hospital Stay (HOSPITAL_COMMUNITY): Payer: Self-pay

## 2020-03-14 DIAGNOSIS — E871 Hypo-osmolality and hyponatremia: Secondary | ICD-10-CM

## 2020-03-14 DIAGNOSIS — E119 Type 2 diabetes mellitus without complications: Secondary | ICD-10-CM

## 2020-03-14 DIAGNOSIS — F329 Major depressive disorder, single episode, unspecified: Secondary | ICD-10-CM

## 2020-03-14 LAB — CBC WITH DIFFERENTIAL/PLATELET
Abs Immature Granulocytes: 0.18 10*3/uL — ABNORMAL HIGH (ref 0.00–0.07)
Basophils Absolute: 0 10*3/uL (ref 0.0–0.1)
Basophils Relative: 0 %
Eosinophils Absolute: 0 10*3/uL (ref 0.0–0.5)
Eosinophils Relative: 1 %
HCT: 23 % — ABNORMAL LOW (ref 39.0–52.0)
Hemoglobin: 8.1 g/dL — ABNORMAL LOW (ref 13.0–17.0)
Immature Granulocytes: 5 %
Lymphocytes Relative: 9 %
Lymphs Abs: 0.3 10*3/uL — ABNORMAL LOW (ref 0.7–4.0)
MCH: 29.7 pg (ref 26.0–34.0)
MCHC: 35.2 g/dL (ref 30.0–36.0)
MCV: 84.2 fL (ref 80.0–100.0)
Monocytes Absolute: 0.2 10*3/uL (ref 0.1–1.0)
Monocytes Relative: 6 %
Neutro Abs: 2.9 10*3/uL (ref 1.7–7.7)
Neutrophils Relative %: 79 %
Platelets: 54 10*3/uL — ABNORMAL LOW (ref 150–400)
RBC: 2.73 MIL/uL — ABNORMAL LOW (ref 4.22–5.81)
RDW: 12.3 % (ref 11.5–15.5)
WBC: 3.6 10*3/uL — ABNORMAL LOW (ref 4.0–10.5)
nRBC: 0 % (ref 0.0–0.2)

## 2020-03-14 LAB — GLUCOSE, CAPILLARY
Glucose-Capillary: 291 mg/dL — ABNORMAL HIGH (ref 70–99)
Glucose-Capillary: 305 mg/dL — ABNORMAL HIGH (ref 70–99)
Glucose-Capillary: 303 mg/dL — ABNORMAL HIGH (ref 70–99)
Glucose-Capillary: 270 mg/dL — ABNORMAL HIGH (ref 70–99)

## 2020-03-14 LAB — COMPREHENSIVE METABOLIC PANEL
ALT: 30 U/L (ref 0–44)
AST: 36 U/L (ref 15–41)
Albumin: 2.1 g/dL — ABNORMAL LOW (ref 3.5–5.0)
Alkaline Phosphatase: 88 U/L (ref 38–126)
Anion gap: 9 (ref 5–15)
BUN: 46 mg/dL — ABNORMAL HIGH (ref 6–20)
CO2: 19 mmol/L — ABNORMAL LOW (ref 22–32)
Calcium: 7.8 mg/dL — ABNORMAL LOW (ref 8.9–10.3)
Chloride: 93 mmol/L — ABNORMAL LOW (ref 98–111)
Creatinine, Ser: 1.57 mg/dL — ABNORMAL HIGH (ref 0.61–1.24)
GFR calc Af Amer: 56 mL/min — ABNORMAL LOW (ref >60)
GFR calc non Af Amer: 49 mL/min — ABNORMAL LOW (ref >60)
Glucose, Bld: 299 mg/dL — ABNORMAL HIGH (ref 70–99)
Potassium: 4.7 mmol/L (ref 3.5–5.1)
Sodium: 121 mmol/L — ABNORMAL LOW (ref 135–145)
Total Bilirubin: 0.6 mg/dL (ref 0.3–1.2)
Total Protein: 6.5 g/dL (ref 6.5–8.1)

## 2020-03-14 LAB — LACTATE DEHYDROGENASE: LDH: 346 U/L — ABNORMAL HIGH (ref 98–192)

## 2020-03-14 LAB — OSMOLALITY: Osmolality: 284 mOsm/kg (ref 275–295)

## 2020-03-14 MED ORDER — SORBITOL 70 % SOLN
60.0000 mL | Status: AC
  Administered 2020-03-14: 60 mL via ORAL
  Filled 2020-03-14: qty 60

## 2020-03-14 MED ORDER — INSULIN GLARGINE 100 UNIT/ML ~~LOC~~ SOLN
26.0000 [IU] | Freq: Every day | SUBCUTANEOUS | Status: DC
  Administered 2020-03-14 – 2020-03-15 (×2): 26 [IU] via SUBCUTANEOUS
  Filled 2020-03-14 (×3): qty 0.26

## 2020-03-14 NOTE — Progress Notes (Signed)
Physical Therapy Session Note  Patient Details  Name: Trevor Shelton MRN: [id number] Date of Birth: 28-Nov-1965  Today's Date: 03/14/2020 PT Individual Time: 1000-1030 PT Individual Time Calculation (min): 30 min   Short Term Goals: Week 2:  PT Short Term Goal 1 (Week 2): pt will roll independently using momentum strategy PT Short Term Goal 2 (Week 2): Pt will perform transfers w/LRAD w/min assist including uneven surfaces PT Short Term Goal 3 (Week 2): Pt will maintain static stand in stedy w/midline posture maintained x 30sec PT Short Term Goal 4 (Week 2): Pt will ascend/descend ramp w/wc w/min assist PT Short Term Goal 5 (Week 2): pt will perform side to sit on mat w/min assist  Skilled Therapeutic Interventions/Progress Updates:    Pt received seated in w/c in room, reports feeling very fatigued this AM but agreeable to attempt participation in session as able. Discussion with pt that therapy recommending a ramp upon d/c home as pt will be at w/c level. Pt agreeable to discuss need for a ramp with his family. Pt reports ongoing rib pain and R shoulder pain, premedicated prior to start of therapy session and pain patches in place. Pt agreeable to attempt supine therex this date. Slide board transfer w/c to bed with min A. Sit to supine mod A for BLE management. Supine bridges and SKFO x 10 reps before onset of fatigue. Pt declines further participation in session due to fatigue. Pt left semi-reclined in bed with needs in reach, bed alarm in place at end of session. Interpreter present during therapy session. Pt missed 30 min of scheduled session due to fatigue.  Therapy Documentation Precautions:  Precautions Precautions: Back, Fall (per chart review, no specific back precautions however encouraged per pt comfort) Precaution Comments: Significant BLE weakness and ataxia-like movements (LLE>RLE); back and rib precautions for comfort due to fxs Restrictions Weight Bearing Restrictions:  No General: PT Amount of Missed Time (min): 30 Minutes PT Missed Treatment Reason: Patient fatigue    Therapy/Group: Individual Therapy   Excell Seltzer, PT, DPT  03/14/2020, 12:07 PM

## 2020-03-14 NOTE — Progress Notes (Signed)
Soap suds enema administered due to no BM today. No results at this time.

## 2020-03-14 NOTE — Progress Notes (Signed)
Physical Therapy Session Note  Patient Details  Name: Trevor Shelton MRN: [id number] Date of Birth: Mar 15, 1966  Today's Date: 03/14/2020 PT Individual Time: 7341-9379 PT Individual Time Calculation (min): 53 min missed time:22 mins 2/2 pain and pt's increased level of fatigue.  Short Term Goals: Week 1:  PT Short Term Goal 1 (Week 1): pt to demonstrate dynamic sitting balance at [facility] for 29mns PT Short Term Goal 1 - Progress (Week 1): Met PT Short Term Goal 2 (Week 1): pt to demonstrate min A x1 for rolling consistently PT Short Term Goal 2 - Progress (Week 1): Met PT Short Term Goal 3 (Week 1): [facility] PT Short Term Goal 3 - Progress (Week 1): Met PT Short Term Goal 4 (Week 1): pt to self propel WC for 100' CGA with setup for parts PT Short Term Goal 4 - Progress (Week 1): Met  Skilled Therapeutic Interventions/Progress Updates:    pt received in bed and agreeable to therapy, son present and in-person interpreter present. Pt directed in supine>sit with log rolling tech at min A to pt's R with VC for tech. Pt directed in sitting EOB for 30 mins at CGA-min A for stability with VC for posture and midline throughout, while in sitting pt directed in static hamstring stretches 3x30s each, B hip flexion, knee extension, hip adduction, hip abduction 2x10 each with visual targets for improved tech for optimal strength gains. Pt directed in BUE strengthening as well in sitting at EOB of 2x10 overhead reaches, bicep curls, forward reaches with one UE at a time to allow pt to improve stability with one assisting at side to prevent LOB posteriorly. Pt directed in lateral scooting at EOB with increased time required to enforce and educate on hips/head relationship to increase I with scooting, pt required min A to complete with this tech, 2x5 R and L direction. Pt directed in forward and backward scooting at EOB x5 each with visual demonstration and VC to complete and  improve head/hips relationship. Pt reported he felt tired and wanted to lay down at end of session however stated he wanted to attempt to use the restroom prior to this and assisted in steady to restroom and assisted in transferring to toilet with riser in place.  PT present for supervision for several mins with son also in close-by supervision. PT asked pt if he was finished and pt replied he would need "about 30 more minutes". Interpreter spoke with pt to ensure this was correct and verified it was, pt son reported being present and in visual supervision of pt. NCT made aware and reported this was ok as long as family was present and pt understandings to ask for assistance and not attempt to stand without staff present. Both pt and son reported understanding and agreeable. Pt also reported he will pull call light string when done. Pt's son present at door. NCT made aware PT leaving room, All needs met and in good condition. Call light string in hand.  Pt's son also had questions about PT recommendations with ramp entry and PT educated that at this time pt would benefit from a ramp to enter, son agreeable.  Therapy Documentation Precautions:  Precautions Precautions: Back, Fall (per chart review, no specific back precautions however encouraged per pt comfort) Precaution Comments: Significant BLE weakness and ataxia-like movements (LLE>RLE); back and rib precautions for comfort due to fxs Restrictions Weight Bearing Restrictions: No    Therapy/Group: Individual Therapy  [name] 03/14/2020, 1:54 PM

## 2020-03-14 NOTE — Progress Notes (Signed)
Occupational Therapy Session Note  Patient Details  Name: Trevor Shelton MRN: [id number] Date of Birth: Sep 07, 1965  Today's Date: 03/14/2020 OT Individual Time: 0800-0900 OT Individual Time Calculation (min): 60 min    Short Term Goals: Week 1:  OT Short Term Goal 1 (Week 1): Pt will be able to sit to EOB with min A. OT Short Term Goal 1 - Progress (Week 1): Met OT Short Term Goal 2 (Week 1): Pt will complete slide board transfer with mod A to prepare for toilet transfers. OT Short Term Goal 2 - Progress (Week 1): Met OT Short Term Goal 3 (Week 1): Pt will be able to push to stand with a RW with max A. OT Short Term Goal 3 - Progress (Week 1): Progressing toward goal OT Short Term Goal 4 (Week 1): from bed level, pt will don pants with mod A. OT Short Term Goal 4 - Progress (Week 1): Met  Skilled Therapeutic Interventions/Progress Updates:    1:1. Pt received in bed agreeable to OT with unrated pain in R shoulder and "constant" dizziness. In person interpreter utilized throughout session pt completes MOD-MAX A transfers in B directions via squat pivot with OT blocking B knees d/t LLE kicking into extension without knee block. Pt completes static (S-CGA) and dynamic sitting balance (CGA-MIN A in min ranges outside BOS) with unilateral support Ues on mat during functional tasks with VC for upright posture and ant pelcov tilt. Pt completes marches and lateral stepping in seated to 2" block for BLE strengthening and coordination required for LE management during functional transfers. Pt returned to w/c and OT installs shoe laces 9elastic) to decrease need to tie shoes. Exited sesseion with pt seated in w/c, exit alarm on and call liht in reach  Therapy Documentation Precautions:  Precautions Precautions: Back, Fall (per chart review, no specific back precautions however encouraged per pt comfort) Precaution Comments: Significant BLE weakness and ataxia-like movements (LLE>RLE); back and rib  precautions for comfort due to fxs Restrictions Weight Bearing Restrictions: No General:   Vital Signs: Therapy Vitals Temp: 98.9 F (37.2 C) Pulse Rate: 93 Resp: 16 BP: 128/79 Patient Position (if appropriate): Lying Oxygen Therapy SpO2: 99 % O2 Device: Room Air Pain:   ADL: ADL Eating: Independent Grooming: Independent Where Assessed-Grooming: Sitting at sink Upper Body Bathing: Setup Where Assessed-Upper Body Bathing: Sitting at sink Lower Body Bathing: Maximal assistance Where Assessed-Lower Body Bathing: Sitting at sink Upper Body Dressing: Setup Where Assessed-Upper Body Dressing: Sitting at sink Lower Body Dressing: Dependent Where Assessed-Lower Body Dressing: Bed level Toileting: Dependent Where Assessed-Toileting: Glass blower/designer: Maximal Print production planner Method: Other (comment) (stedy) Science writer: Raised toilet seat Vision   Perception    Praxis   Exercises:   Other Treatments:     Therapy/Group: Individual Therapy  Tonny Branch 03/14/2020, 7:07 AM

## 2020-03-14 NOTE — Progress Notes (Addendum)
[place] PHYSICAL MEDICINE & REHABILITATION PROGRESS NOTE   Subjective/Complaints:  Overall doing well. Some improvement in LE strength. OT is with him in the gym, working on shoes, elastic laces. Having back pain still but not too bad, right shoulder sore too.   ROS: Patient denies fever, rash, sore throat, blurred vision, nausea, vomiting, diarrhea, cough, shortness of breath or chest pain, joint or back pain, headache, or mood change.           Objective:   DG Abd 1 View  Result Date: 03/13/2020 CLINICAL DATA:  Nausea, vomiting. EXAM: ABDOMEN - 1 VIEW COMPARISON:  March 10, 2020. FINDINGS: The bowel gas pattern is normal. Moderate amount of stool seen in right colon. No radio-opaque calculi or other significant radiographic abnormality are seen. IMPRESSION: Moderate stool burden is noted. No evidence of bowel obstruction or ileus. Electronically Signed   By: Marijo Conception M.D.   On: 03/13/2020 11:31   Recent Labs    03/14/20 0611  WBC 3.6*  HGB 8.1*  HCT 23.0*  PLT 54*   Recent Labs    03/14/20 0611  NA 121*  K 4.7  CL 93*  CO2 19*  GLUCOSE 299*  BUN 46*  CREATININE 1.57*  CALCIUM 7.8*    Intake/Output Summary (Last 24 hours) at 03/14/2020 1030 Last data filed at 03/14/2020 9983 Gross per 24 hour  Intake 904 ml  Output 1650 ml  Net -746 ml     Physical Exam: Vital Signs Blood pressure 128/79, pulse 93, temperature 98.9 F (37.2 C), resp. rate 16, height '5\' 3"'  (1.6 m), weight 60.9 kg, SpO2 99 %. Constitutional: No distress . Vital signs reviewed. HEENT: EOMI, oral membranes moist Neck: supple Cardiovascular: RRR without murmur. No JVD    Respiratory/Chest: CTA Bilaterally without wheezes or rales. Normal effort    GI/Abdomen: BS +, non-tender, Sl distended Ext: no clubbing, cyanosis, or edema Psych: reasonable affect Musculoskeletal: -right shoulder tender with IR/ER, low back TTP    Cervical back: Neck supple. No rigidity.     Comments: UEs 5/5 in  deltoid, biceps, triceps, WE, grip and finger abd B/L- pain with biceps testing due to rib fx's LEs- RLE- HF 3-/5, KE 3+/5, DF 4-/5, PF 5-/5 LLE- HF 2/5, KE 3/5, DF 2/5, PF 5-/5 --motor exam stable Skin:    General: Skin is warm and dry.  Large purple bruise on sacrum- a tiny puncture mark under it- healing well B/L knee brown squishy bruises- improving/healing  2 radiation tags- chest and R side  Neurological: Ox3.   Light touch intact in all 4 extremities B/L    Assessment/Plan: 1. 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  Physiatrist is providing close team supervision and 24 hour management of active medical problems listed below.  Physiatrist and rehab team continue to assess barriers to discharge/monitor patient progress toward functional and medical goals  Care Tool:  Bathing    Body parts bathed by patient: Right arm, Left arm, Chest, Abdomen, Front perineal area, Right upper leg, Left upper leg, Right lower leg, Left lower leg, Face   Body parts bathed by helper: Buttocks     Bathing assist Assist Level: Moderate Assistance - Patient 50 - 74%     Upper Body Dressing/Undressing Upper body dressing   What is the patient wearing?: Pull over shirt    Upper body  assist Assist Level: Set up assist    Lower Body Dressing/Undressing Lower body dressing      What is the patient wearing?: Pants     Lower body assist Assist for lower body dressing: Moderate Assistance - Patient 50 - 74%     Toileting Toileting    Toileting assist Assist for toileting: Maximal Assistance - Patient 25 - 49% Assistive Device Comment: urinal   Transfers Chair/bed transfer  Transfers assist     Chair/bed transfer assist level: Moderate Assistance - Patient 50 - 74%     Locomotion Ambulation   Ambulation  assist   Ambulation activity did not occur: Safety/medical concerns          Walk 10 feet activity   Assist  Walk 10 feet activity did not occur: Safety/medical concerns        Walk 50 feet activity   Assist Walk 50 feet with 2 turns activity did not occur: Safety/medical concerns         Walk 150 feet activity   Assist Walk 150 feet activity did not occur: Safety/medical concerns         Walk 10 feet on uneven surface  activity   Assist Walk 10 feet on uneven surfaces activity did not occur: Safety/medical concerns         Wheelchair     Assist Will patient use wheelchair at discharge?: Yes Type of Wheelchair: Manual    Wheelchair assist level: Independent Max wheelchair distance: 100    Wheelchair 50 feet with 2 turns activity    Assist        Assist Level: Independent   Wheelchair 150 feet activity     Assist      Assist Level: Supervision/Verbal cueing   Blood pressure 128/79, pulse 93, temperature 98.9 F (37.2 C), resp. rate 16, height '5\' 3"'  (1.6 m), weight 60.9 kg, SpO2 99 %.   Medical Problem List and Plan: 1.  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  Antithrombotics: -DVT/anticoagulation:  Pharmaceutical: Lovenox   8/27- at high risk for DVT due to SCI and cancer being treated             --BLE dopplers negative for DVT             -antiplatelet therapy: N/a 3. Pain Management: Continue oxycodone --increase to 10 mg q 4 hrs prn--d/c IV dilaudid.  9/1- pt still having pain- encouraged to take meds if need be. Explained why he still having pain.  9/2- will add Lidocaine patch to R shoulder   9/6 pain control seems reasonable 4. Mood: LCSW to follow for evaluation and support.              -antipsychotic agents: N/a 5.  Neuropsych: This patient is capable of making decisions on his own behalf. 6. Skin/Wound Care: Routine pressure relief measures.  7. Fluids/Electrolytes/Nutrition: Strict I/O. Check lytes in am.  8. Acute renal failure: Improving 81/4.92-->62/1.49  8/30- Cr 1.37 and BUN 50  8/31- Cr 1.25 and Bun down to 45- much better  9/1- Cr 1.17  9/6 - Cr up to 1.57 today, hold remeron, follow up BMET tomorrow 9. Hyponatremia: Recurrent hyponatremia. Improving to 124  on 8/28.  8/30- Na 127 today  9/6 Na down to 121 today. 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 New diagnosis T2DM: Hgb A1c-6.5. BS poorly controlled due to steriods as well as Ensure tid between meals. changed ensure to Ensure Max --continue Lantus 10 units at bedtime with 3 units meal coverage tid as well as SSI for elevated BS.    CBG (last 3)  Recent Labs    03/13/20 1632 03/13/20 2051 03/14/20 0553  GLUCAP 297* 297* 291*    9/1- will increase Lantus to 16 units due to elevated BGs  9/2- will increase lantus again- to 18 units  9/3- will wait til tomorrow to increase Lantus  9/4- BGs keep going UP no matter how much increase Lantus- and pt said not eating a lot- will increase Lantus again.  9/6 increase lantus to 26u as sugars still poorly controlled 11. Constipation: Continue miralax am/change senna to lunch. KUB done showing ileus?  Wife/patient reports lot of gas and that he has been having BM daily. Pt says as well, BM daily.   8/27- adynamic ileus yesterday- will make sure pt has BM and recheck KUB in AM  8/29: KUB reviewed and shows moderate stool burden- he is having BM now  9/3- still has ileus gas pattern- will give Mg citrate to get him cleaned out this afternoon  9/4- Large BM yesterday and this AM- will order enema just in case  9/5- 2 extra large BMs yesterday- will order Sorbitol because still nauseated AND abd distended- KUB shows moderate stool burden  9/6 repeat  sorbitol followed by SSE today if still no bm 12. Mild BPH: will monitor voiding with PVR checks.  13. 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 Pathologic Fx T6 and T9:  Treated with XRT 10/10 and high dose steroids.  15. L foot drop- get prevalon boot on L at night  9/1- told wasn't in room- will order another PRAFO  16. Hyponatremia/Hyperkalemia  8/27- Na 122 this AM- will call Nephrology, esp because K+ 4.8 and was on Lokelma BID- changed to daily and held dose this AM.  8/3-- can stop Lokelma per nephrology and Na up to 127- doing better-   17. Hiccups- for 1 week  8/27- on Thorazine- will add baclofen 10 mg TID prn for hiccups  8/30- will change to QID and schedule it- also asked nursing to give Thorazine when notices hiccups, if due- to try and help.   9/2- said hiccups much better  9/5-6- hiccups coming back intermittently again- con't regimen 18. Thrombocytopenia  8/30- plts dropping- are 79k- if drop again by Wednesday, will need to see what else is possible other than Lovenox- will check with Heme/Onc.    9/6 Plts down to 54k today---recheck tomorrow 18. Dispo  8/30- pt/family want to have discussion Tuesday 1-1:30 pm to discuss medical issues- will try and do this.   9/1- no interpretor there- will do Thursday 19. Dizziness  9/1- - will encourage to push fluids and if doesn't fix, will order abd binder  9/3- vertigo eval by PT showed nothing- likely some orthostatic hypotension that we don't see/catch- will try midodrine early in AM 5 mg and see how he does.   9/4- slightly better  9/5- will increase midodrine to 7.5 mg BID with breakfast and lunch- had an episode of orthostasis with therapy today- this AM-  20. N/V- vomited 3x this AM  9/2- could be chemo- could be meds- will add Zofran prn ; con't compazine; and alternate- ask nursing to give.  9/3 check on Chemo -with PA   9/4- to start Chemo Tuesday- from ileus 21. GERD  9/2- will increase PPI to BID 22.  Insomnia  9/2- increase trazodone to 100 mg QHS  9/3- slept better  9/6-  Hold remeron as noted above. Continue trazodone prn 23. Nose bleeds-  9/5-  Afrin prn and saline nasal spray.         LOS: 11 days A FACE TO [place] 03/14/2020, 10:30 AM

## 2020-03-15 ENCOUNTER — Other Ambulatory Visit: Payer: Self-pay | Admitting: Physical Medicine and Rehabilitation

## 2020-03-15 ENCOUNTER — Inpatient Hospital Stay (HOSPITAL_COMMUNITY): Payer: Self-pay | Admitting: Physical Therapy

## 2020-03-15 ENCOUNTER — Inpatient Hospital Stay (HOSPITAL_COMMUNITY): Payer: Self-pay

## 2020-03-15 ENCOUNTER — Inpatient Hospital Stay (HOSPITAL_COMMUNITY): Payer: Self-pay | Admitting: Occupational Therapy

## 2020-03-15 LAB — BASIC METABOLIC PANEL
Anion gap: 10 (ref 5–15)
BUN: 40 mg/dL — ABNORMAL HIGH (ref 6–20)
CO2: 18 mmol/L — ABNORMAL LOW (ref 22–32)
Calcium: 7.9 mg/dL — ABNORMAL LOW (ref 8.9–10.3)
Chloride: 94 mmol/L — ABNORMAL LOW (ref 98–111)
Creatinine, Ser: 1.24 mg/dL (ref 0.61–1.24)
GFR calc Af Amer: >60 mL/min (ref >60)
GFR calc non Af Amer: >60 mL/min (ref >60)
Glucose, Bld: 298 mg/dL — ABNORMAL HIGH (ref 70–99)
Potassium: 4.3 mmol/L (ref 3.5–5.1)
Sodium: 122 mmol/L — ABNORMAL LOW (ref 135–145)

## 2020-03-15 LAB — CBC
HCT: 23.2 % — ABNORMAL LOW (ref 39.0–52.0)
Hemoglobin: 8.1 g/dL — ABNORMAL LOW (ref 13.0–17.0)
MCH: 29.5 pg (ref 26.0–34.0)
MCHC: 34.9 g/dL (ref 30.0–36.0)
MCV: 84.4 fL (ref 80.0–100.0)
Platelets: 76 10*3/uL — ABNORMAL LOW (ref 150–400)
RBC: 2.75 MIL/uL — ABNORMAL LOW (ref 4.22–5.81)
RDW: 12.2 % (ref 11.5–15.5)
WBC: 4.2 10*3/uL (ref 4.0–10.5)
nRBC: 1.4 % — ABNORMAL HIGH (ref 0.0–0.2)

## 2020-03-15 LAB — CBC WITH DIFFERENTIAL/PLATELET
Abs Immature Granulocytes: 0.24 10*3/uL — ABNORMAL HIGH (ref 0.00–0.07)
Basophils Absolute: 0 10*3/uL (ref 0.0–0.1)
Basophils Relative: 0 %
Eosinophils Absolute: 0 10*3/uL (ref 0.0–0.5)
Eosinophils Relative: 1 %
HCT: 23.1 % — ABNORMAL LOW (ref 39.0–52.0)
Hemoglobin: 8 g/dL — ABNORMAL LOW (ref 13.0–17.0)
Immature Granulocytes: 6 %
Lymphocytes Relative: 8 %
Lymphs Abs: 0.3 10*3/uL — ABNORMAL LOW (ref 0.7–4.0)
MCH: 29 pg (ref 26.0–34.0)
MCHC: 34.6 g/dL (ref 30.0–36.0)
MCV: 83.7 fL (ref 80.0–100.0)
Monocytes Absolute: 0.3 10*3/uL (ref 0.1–1.0)
Monocytes Relative: 6 %
Neutro Abs: 3.4 10*3/uL (ref 1.7–7.7)
Neutrophils Relative %: 79 %
Platelets: UNDETERMINED 10*3/uL (ref 150–400)
RBC: 2.76 MIL/uL — ABNORMAL LOW (ref 4.22–5.81)
RDW: 12.2 % (ref 11.5–15.5)
WBC: 4.2 10*3/uL (ref 4.0–10.5)
nRBC: 1.2 % — ABNORMAL HIGH (ref 0.0–0.2)

## 2020-03-15 LAB — GLUCOSE, CAPILLARY
Glucose-Capillary: 265 mg/dL — ABNORMAL HIGH (ref 70–99)
Glucose-Capillary: 219 mg/dL — ABNORMAL HIGH (ref 70–99)
Glucose-Capillary: 209 mg/dL — ABNORMAL HIGH (ref 70–99)
Glucose-Capillary: 313 mg/dL — ABNORMAL HIGH (ref 70–99)

## 2020-03-15 MED ORDER — SODIUM CHLORIDE 0.9 % IV SOLN: INTRAVENOUS | Status: AC

## 2020-03-15 MED ORDER — SODIUM CHLORIDE 0.9 % IV SOLN
40.0000 mg | Freq: Once | INTRAVENOUS | Status: AC
  Administered 2020-03-15: 40 mg via INTRAVENOUS
  Filled 2020-03-15 (×3): qty 4
  Filled 2020-03-15: qty 10

## 2020-03-15 MED ORDER — SALINE SPRAY 0.65 % NA SOLN
1.0000 | Freq: Four times a day (QID) | NASAL | Status: DC
  Administered 2020-03-15 – 2020-03-16 (×5): 1 via NASAL
  Filled 2020-03-15: qty 44

## 2020-03-15 MED ORDER — OXYMETAZOLINE HCL 0.05 % NA SOLN
1.0000 | Freq: Two times a day (BID) | NASAL | Status: AC
  Administered 2020-03-15 – 2020-03-17 (×6): 1 via NASAL
  Filled 2020-03-15: qty 30

## 2020-03-15 MED ORDER — QUETIAPINE FUMARATE 50 MG PO TABS
50.0000 mg | ORAL_TABLET | Freq: Every day | ORAL | Status: DC
  Administered 2020-03-15 – 2020-03-16 (×2): 50 mg via ORAL
  Filled 2020-03-15 (×2): qty 1

## 2020-03-15 MED ORDER — SODIUM BICARBONATE/SODIUM CHLORIDE MOUTHWASH
OROMUCOSAL | Status: DC | PRN
  Filled 2020-03-15 (×3): qty 1000

## 2020-03-15 MED ORDER — DEXAMETHASONE 4 MG PO TABS
4.0000 mg | ORAL_TABLET | Freq: Two times a day (BID) | ORAL | Status: AC
  Administered 2020-03-15 – 2020-03-17 (×5): 4 mg via ORAL
  Filled 2020-03-15 (×6): qty 1

## 2020-03-15 MED ORDER — PALONOSETRON HCL INJECTION 0.25 MG/5ML
0.2500 mg | Freq: Once | INTRAVENOUS | Status: AC
  Administered 2020-03-15: 0.25 mg via INTRAVENOUS
  Filled 2020-03-15 (×2): qty 5

## 2020-03-15 MED ORDER — MAGIC MOUTHWASH W/LIDOCAINE
5.0000 mL | Freq: Four times a day (QID) | ORAL | Status: DC | PRN
  Administered 2020-03-16: 5 mL via ORAL
  Filled 2020-03-15: qty 5

## 2020-03-15 MED ORDER — BORTEZOMIB CHEMO SQ INJECTION 3.5 MG (2.5MG/ML)
1.5000 mg/m2 | Freq: Once | INTRAMUSCULAR | Status: AC
  Administered 2020-03-15: 2.5 mg via SUBCUTANEOUS
  Filled 2020-03-15: qty 1

## 2020-03-15 MED ORDER — LIVING WELL WITH DIABETES BOOK - IN SPANISH
Freq: Once | Status: AC
  Administered 2020-03-16: 2
  Filled 2020-03-15: qty 1

## 2020-03-15 MED ORDER — SUCRALFATE 1 GM/10ML PO SUSP
1.0000 g | Freq: Three times a day (TID) | ORAL | Status: DC
  Administered 2020-03-15 – 2020-03-19 (×17): 1 g via ORAL
  Filled 2020-03-15 (×19): qty 10

## 2020-03-15 NOTE — Progress Notes (Signed)
Physical Therapy Session Note  Patient Details  Name: Trevor Shelton MRN: [id number] Date of Birth: [birth year]  Today's Date: 03/15/2020 PT Individual Time: 1300-1400 PT Individual Time Calculation (min): 60 min  PT Amount of Missed Time (min): 15 Minutes PT Missed Treatment Reason: Patient fatigue  Short Term Goals: Week 2:  PT Short Term Goal 1 (Week 2): pt will roll independently using momentum strategy PT Short Term Goal 2 (Week 2): Pt will perform transfers w/LRAD w/min assist including uneven surfaces PT Short Term Goal 3 (Week 2): Pt will maintain static stand in stedy w/midline posture maintained x 30sec PT Short Term Goal 4 (Week 2): Pt will ascend/descend ramp w/wc w/min assist PT Short Term Goal 5 (Week 2): pt will perform side to sit on mat w/min assist  Skilled Therapeutic Interventions/Progress Updates:    Pt received seated in bed, agreeable to PT session. Pt reports ongoing pain in R shoulder and rib region. Updated pt and his wife on extended LOS, ramp needs, and w/c needs. Pt reports a family friend is working on building them a ramp. Pt agreeable to participate in therapy session as able but reports still feeling weak and not well overall. Supine to sit with min A for some LLE management. Slide board transfer bed to w/c with mod A, ataxia in BLE limiting ability to assist. Manual w/c propulsion x 150 ft with use of BUE at mod I level. Slide board transfer w/c to/from mat table with min A. Seated balance EOM with close SBA to min A reaching outside BOS and across midline with alt UE, encouraged pt to utilize trunk to weight shift vs just reaching with UE. Attempt to have pt perform mini-crunches, he reports increase in rib pain, exercise deferred. Attempt to have pt perform hip extension on Kinetron while seated in w/c, unable to perform adequately due to glute weakness and exhibits significant compensations. Pt declines further participation in session at this time due to fatigue.  Slide board transfer back to bed with mod A. Sit to supine mod A for BLE management. Pt then has onset of nausea and reports he feels like something is caught in his throat, provided him with emesis bag. Nursing notified and aware of pt's symptoms. Pt left seated in bed with needs in reach, bed alarm in place, wife present. Interpreter present during therapy session. Pt missed 15 min of scheduled therapy session due to fatigue.  Therapy Documentation Precautions:  Precautions Precautions: Back, Fall (per chart review, no specific back precautions however encouraged per pt comfort) Precaution Comments: Significant BLE weakness and ataxia-like movements (LLE>RLE); back and rib precautions for comfort due to fxs Restrictions Weight Bearing Restrictions: No General: PT Amount of Missed Time (min): 15 Minutes PT Missed Treatment Reason: Patient fatigue    Therapy/Group: Individual Therapy   Excell Seltzer, PT, DPT  03/15/2020, 5:13 PM

## 2020-03-15 NOTE — Progress Notes (Signed)
Physical Therapy Note  Patient Details  Name: Trevor Shelton MRN: [id number] Date of Birth: 18-Apr-1966 Today's Date: 03/15/2020    Recommending the following equipment to increase functional independence with mobility: ~ Standard lightweight wheelchair with legrests 16x16   Patient measurements:    Hip Width: 14.5"    Seat Depth (-2"): 17"    Back Height: 24.5"   W/c cushion: standard foam cushion 16x16    ~ 26" slide board. Pt is unable to safely stand at this time and will require a slide board for safe transfers.     Excell Seltzer, PT, DPT  03/15/2020, 5:20 PM

## 2020-03-15 NOTE — Progress Notes (Signed)
Occupational Therapy Session Note  Patient Details  Name: Trevor Shelton MRN: [id number] Date of Birth: 25-Apr-1966  Today's Date: 03/15/2020 OT Individual Time: 0800-0900 OT Individual Time Calculation (min): 60 min    Short Term Goals: Week 2:  OT Short Term Goal 1 (Week 2): Pt will be able to push to stand with a RW with max A. OT Short Term Goal 2 (Week 2): Pt will perform squat pivot transfers with mod A OT Short Term Goal 3 (Week 2): Pt will perform toileting tasks with mod A (2/3)  Skilled Therapeutic Interventions/Progress Updates:    Pt resting in bed upon arrival.  Interpreter and MD present. Pt states he was unable to sleep during night and had been tired for the last couple of days. Pt stated he felt weak but was willing to try washing up and changing clothing. Bed mobility with supervision.  Unsupported static sitting EOB with supervision and min A for dynamic sitting balance. Pt requested use of toilet and transferred to toilet with Stedy.  Sit<>stand with Stedy at mod A. Pt relies on BUE strength for pulling up in [place].  Pt required tot A for hygiene after BM. Pt completed bathing/dressing seated EOB with sit<>stand in Stedy for pulling pants over hips. Pt remained in bed with all needs within reach. Bed alarm activated.   Therapy Documentation Precautions:  Precautions Precautions: Back, Fall (per chart review, no specific back precautions however encouraged per pt comfort) Precaution Comments: Significant BLE weakness and ataxia-like movements (LLE>RLE); back and rib precautions for comfort due to fxs Restrictions Weight Bearing Restrictions: No   Pain: Pain Assessment Pain Scale: 0-10 Pain Score: 4 , R shoulder Meds admin during session     Therapy/Group: Individual Therapy  Leroy Libman 03/15/2020, 9:02 AM

## 2020-03-15 NOTE — Progress Notes (Signed)
Discussed patient's labs with Mikey Bussing, NP with Oncology. She feels that labs indicate progression of patient's disease--To hold lovenox for platelets<50,000 and monitor. They treat prn plt<10,000. Dr. Lorenso Courier plans on starting weekly chemo today regardless of labs and hope that this will help with recovery. They will follow up with patient today.

## 2020-03-15 NOTE — Progress Notes (Signed)
Occupational Therapy Session Note  Patient Details  Name: Maryland Luppino MRN: [id number] Date of Birth: 07-04-1966  Today's Date: 03/15/2020 OT Individual Time: 1000-1100 OT Individual Time Calculation (min): 60 min    Short Term Goals: Week 2:  OT Short Term Goal 1 (Week 2): Pt will be able to push to stand with a RW with max A. OT Short Term Goal 2 (Week 2): Pt will perform squat pivot transfers with mod A OT Short Term Goal 3 (Week 2): Pt will perform toileting tasks with mod A (2/3)  Skilled Therapeutic Interventions/Progress Updates:    Patient in bed, interpreter present.  He denies pain at this time but states that his energy level is low.  He is agreeable to getting OOB for therapeutic activities.  Supine to sitting edge of bed with min a.  Max A to [employer].  SB transfer bed to w/c with min/mod A and cues for body position.  Completed light dowel exercises with fatigue noted after 10-12 reps of each exercise with 1# dowel.  Completed trunk mobility and posture activities seated in w/c - reviewed back safety and limiting rotation and flexion at this time.  Completed proximal stability/posture activity with reach to nuts/bolt box - cues for thoracic extension, head and shoulder positioning.   SB transfer back to bed with mod A.  Sit to supine min A.  Completed trunk/hip control activities in supine position.  He remained in bed at close of session due to fatigue, bed alarm set and call bell in reach.  Wife entered at close of session.    Therapy Documentation Precautions:  Precautions Precautions: Back, Fall (per chart review, no specific back precautions however encouraged per pt comfort) Precaution Comments: Significant BLE weakness and ataxia-like movements (LLE>RLE); back and rib precautions for comfort due to fxs Restrictions Weight Bearing Restrictions: No   Therapy/Group: Individual Therapy  Carlos Levering 03/15/2020, 7:40 AM

## 2020-03-15 NOTE — Plan of Care (Signed)
  Problem: Consults Goal: RH SPINAL CORD INJURY PATIENT EDUCATION Description:  See Patient Education module for education specifics.  Outcome: Progressing Goal: Skin Care Protocol Initiated - if Braden Score 18 or less Description: If consults are not indicated, leave blank or document N/A Outcome: Progressing Goal: Diabetes Guidelines if Diabetic/Glucose > 140 Description: If diabetic or lab glucose is > 140 mg/dl - Initiate Diabetes/Hyperglycemia Guidelines & Document Interventions  Outcome: Progressing   Problem: SCI BOWEL ELIMINATION Goal: RH STG MANAGE BOWEL WITH ASSISTANCE Description: STG Manage Bowel with mod Assistance. Outcome: Progressing Goal: RH STG MANAGE BOWEL W/EQUIPMENT W/ASSISTANCE Description: STG Manage Bowel With Equipment With mod Assistance Outcome: Progressing Goal: RH STG SCI MANAGE BOWEL PROGRAM W/ASSIST OR AS APPROPRIATE Description: STG SCI Manage bowel program w/mo assist or as appropriate. Outcome: Progressing   Problem: SCI BLADDER ELIMINATION Goal: RH STG MANAGE BLADDER WITH ASSISTANCE Description: STG Manage Bladder With min Assistance Outcome: Progressing Goal: RH STG MANAGE BLADDER WITH MEDICATION WITH ASSISTANCE Description: STG Manage Bladder With Medication With min Assistance. Outcome: Progressing   Problem: RH SKIN INTEGRITY Goal: RH STG SKIN FREE OF INFECTION/BREAKDOWN Description: Skin will be free of infection/breakdown with min assist Outcome: Progressing Goal: RH STG MAINTAIN SKIN INTEGRITY WITH ASSISTANCE Description: STG Maintain Skin Integrity With min Assistance. Outcome: Progressing   Problem: RH SAFETY Goal: RH STG ADHERE TO SAFETY PRECAUTIONS W/ASSISTANCE/DEVICE Description: STG Adhere to Safety Precautions With min Assistance/Device. Outcome: Progressing   Problem: RH PAIN MANAGEMENT Goal: RH STG PAIN MANAGED AT OR BELOW PT'S PAIN GOAL Description: Pain will be managed at or below 2 out of 10 with min assist Outcome:  Progressing   Problem: RH KNOWLEDGE DEFICIT SCI Goal: RH STG INCREASE KNOWLEDGE OF SELF CARE AFTER SCI Outcome: Progressing

## 2020-03-15 NOTE — Progress Notes (Signed)
Patient ID: Jere Bostrom, male   DOB: 01-15-1966, 54 y.o.   MRN: [id number]   SW met with pt, pt wife and pt sister in law in room to using [employer] Spanish interpreter Lowella Dandy (ID# [id number] updates from team conference, and change in d/c date from 9/15 to 9/22. SW discussed ramp needed. Sister in law states pt brother is working on this for him. SW discussed recommended DME: w/c, DABSC, slide board and TTB needed. Family reported concerns related to home set up and bathroom being small. SW encouraged family to take pictures of home so they can speak with therapy and get suggestions on how best to manage home. SW discussed charity process again. Pt wife states she does not have a card to put on file. Family has been encouraged to begin searching for these items. SW discussed [facility] medication assistance program. SW discussed appointment card provided, and provided clarity. Discussed family education. SW to follow-up with family next week to discuss progress on ramp and locating DME.   Loralee Pacas, MSW, [place] Office: [phone] Cell: [phone] Fax: [phone]

## 2020-03-15 NOTE — Progress Notes (Signed)
Ok to start Velcade today with plt=76 and Hg=8.1 per Dr Lorenso Courier

## 2020-03-15 NOTE — Progress Notes (Signed)
[place] [phone]   [phone]  PROGRESS NOTE  Patient Care Team: Patient, No Pcp Per as PCP - General (General Practice)  Hematological/Oncological History # Lytic Lesions Throughout Skeleton/Hypercalcemia # Pathologic Fractures of Spine/Soft Tissue Masses in Spine #Concern for Multiple Myeloma 1) 02/14/2020: presented to the emergency department with markedly worsening back pain. 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 The T6 lesion severely narrows the thecal sac and deforms the spinal cord. 3) 02/18/2020: establish care with Dr. Lorenso Courier    HISTORY OF PRESENTING ILLNESS:  Trevor Shelton 54 y.o. male with medical history significant for HTN and HLD who presented with back pain and was found to have cord compression and lytic lesions concerning for multiple myeloma.   On review of the previous records progress he initially presented to the emergency department on 02/14/2020 with markedly worsening back pain. The pain has been going on for approximately 1 month's time when he initially presented. In the emergency department had a CT chest abdomen pelvis which showed lytic lesions throughout the skeleton consistent with multiple myeloma or metastatic disease. There were also signs of compression fracture of the spine. An MRI spine was ordered which showed epidural tumor extension at T6 and T8-9. The T6 lesion severely narrowed the thecal sac and deforms the spinal cord. The patient was referred to radiation oncology. In addition to radiation oncology, medical oncology was consulted for further evaluation and management as well.  The patient was seen today with the assistance of an interpreter.  His wife is at the bedside. Nursing also present. He reports mouth sores today. Attributes to chemo, but nursing reports chemo not  yet administered. He reports nausea as well. No vomiting reported. The patient is currently admitted to CIR.  He is working with therapy and reports a small improvement with his strength.  He states that he is still unable to stand.  Continues to rate pain 4-5 out of 10 at this time. He was started on IVF earlier today due to dehydration. The remainder of a 10 point review of systems is noncontributory.  MEDICAL HISTORY:  Past Medical History:  Diagnosis Date  . AKI (acute kidney injury) ([place]) 02/2020  . Hypercalcemia 02/2020  . Hypercholesteremia   . Hypertension     SURGICAL HISTORY: Past Surgical History:  Procedure Laterality Date  . HERNIA REPAIR    . IR FLUORO GUIDE CV LINE RIGHT  02/17/2020  . IR REMOVAL TUN CV CATH W/O FL  03/01/2020  . IR US GUIDE VASC ACCESS RIGHT  02/17/2020    SOCIAL HISTORY: Social History   Socioeconomic History  . Marital status: Married    Spouse name: Not on file  . Number of children: Not on file  . Years of education: Not on file  . Highest education level: Not on file  Occupational History  . Not on file  Tobacco Use  . Smoking status: Never Smoker  . Smokeless tobacco: Never Used  Vaping Use  . Vaping Use: Never used  Substance and Sexual Activity  . Alcohol use: Not Currently  . Drug use: Not Currently  . Sexual activity: Not Currently  Other Topics Concern  . Not on file  Social History Narrative  . Not on file   Social Determinants of Health   Financial Resource Strain:   . Difficulty of Paying Living Expenses: Not on  file  Food Insecurity:   . Worried About Charity fundraiser in the Last Year: Not on file  . Ran Out of Food in the Last Year: Not on file  Transportation Needs:   . Lack of Transportation (Medical): Not on file  . Lack of Transportation (Non-Medical): Not on file  Physical Activity:   . Days of Exercise per Week: Not on file  . Minutes of Exercise per Session: Not on file  Stress:   . Feeling of Stress :  Not on file  Social Connections:   . Frequency of Communication with Friends and Family: Not on file  . Frequency of Social Gatherings with Friends and Family: Not on file  . Attends Religious Services: Not on file  . Active Member of Clubs or Organizations: Not on file  . Attends Archivist Meetings: Not on file  . Marital Status: Not on file  Intimate Partner Violence:   . Fear of Current or Ex-Partner: Not on file  . Emotionally Abused: Not on file  . Physically Abused: Not on file  . Sexually Abused: Not on file    FAMILY HISTORY: Family History  Problem Relation Age of Onset  . Alzheimer's disease Mother   . Diabetes Father   . Diabetes Brother     ALLERGIES:  has No Known Allergies.  MEDICATIONS:  Current Facility-Administered Medications  Medication Dose Route Frequency Provider Last Rate Last Admin  . (feeding supplement) PROSource Plus liquid 30 mL  30 mL Oral BID BM Love, Pamela S, PA-C   30 mL at 03/15/20 1428  . 0.9 %  sodium chloride infusion   Intravenous Continuous Lovorn, Megan, MD 100 mL/hr at 03/15/20 1243 New Bag at 03/15/20 1243  . acetaminophen (TYLENOL) tablet 325-650 mg  325-650 mg Oral Q4H PRN Bary Leriche, PA-C   650 mg at 03/14/20 2050  . acyclovir (ZOVIRAX) 200 MG capsule 400 mg  400 mg Oral Daily Mikey Bussing R, NP   400 mg at 03/15/20 0829  . alum & mag hydroxide-simeth (MAALOX/MYLANTA) 200-200-20 MG/5ML suspension 30 mL  30 mL Oral Q4H PRN Bary Leriche, PA-C   30 mL at 03/12/20 2055  . amLODipine (NORVASC) tablet 10 mg  10 mg Oral Daily Bary Leriche, PA-C   10 mg at 03/15/20 2952  . atorvastatin (LIPITOR) tablet 20 mg  20 mg Oral Daily Bary Leriche, PA-C   20 mg at 03/15/20 0905  . baclofen (LIORESAL) tablet 10 mg  10 mg Oral QID Lovorn, Megan, MD   10 mg at 03/15/20 1200  . bisacodyl (DULCOLAX) suppository 10 mg  10 mg Rectal Daily PRN Love, Pamela S, PA-C      . bortezomib SQ (VELCADE) chemo injection 2.5 mg  1.5 mg/m2  (Treatment Plan Recorded) Subcutaneous Once Ledell Peoples IV, MD      . carvedilol (COREG) tablet 3.125 mg  3.125 mg Oral BID WC Love, Ivan Anchors, PA-C   3.125 mg at 03/15/20 0829  . chlorproMAZINE (THORAZINE) tablet 25 mg  25 mg Oral QID PRN Courtney Heys, MD   25 mg at 03/14/20 8413  . clonazePAM (KLONOPIN) disintegrating tablet 0.25 mg  0.25 mg Oral TID PRN Bary Leriche, PA-C   0.25 mg at 03/10/20 0142  . [START ON 03/18/2020] dexamethasone (DECADRON) tablet 2 mg  2 mg Oral Q12H Trinette Vera R, NP      . dexamethasone (DECADRON) tablet 4 mg  4 mg Oral Q12H Mazikeen Hehn,  Roselie Awkward, NP      . diphenhydrAMINE (BENADRYL) 12.5 MG/5ML elixir 12.5-25 mg  12.5-25 mg Oral Q6H PRN Love, Pamela S, PA-C      . guaiFENesin-dextromethorphan (ROBITUSSIN DM) 100-10 MG/5ML syrup 5-10 mL  5-10 mL Oral Q6H PRN Love, Pamela S, PA-C      . insulin aspart (novoLOG) injection 0-9 Units  0-9 Units Subcutaneous TID WC Bary Leriche, PA-C   3 Units at 03/15/20 1202  . insulin aspart (novoLOG) injection 3 Units  3 Units Subcutaneous TID WC Bary Leriche, PA-C   3 Units at 03/15/20 1203  . insulin glargine (LANTUS) injection 26 Units  26 Units Subcutaneous QHS Meredith Staggers, MD   26 Units at 03/14/20 2236  . lidocaine (LIDODERM) 5 % 2 patch  2 patch Transdermal Q24H Lovorn, Megan, MD   2 patch at 03/15/20 0904  . lidocaine (XYLOCAINE) 2 % jelly   Topical PRN Love, Pamela S, PA-C      . lidocaine (XYLOCAINE) 2 % viscous mouth solution 15 mL  15 mL Mouth/Throat Q6H PRN Love, Pamela S, PA-C      . living well with diabetes book- in spanish   Does not apply Once Lovorn, Jinny Blossom, MD      . magic mouthwash w/lidocaine  5 mL Oral QID PRN Maryanna Shape, NP      . midodrine (PROAMATINE) tablet 7.5 mg  7.5 mg Oral BID WC Lovorn, Megan, MD   7.5 mg at 03/15/20 1159  . ondansetron (ZOFRAN) tablet 4 mg  4 mg Oral Q6H PRN Lovorn, Megan, MD   4 mg at 03/15/20 0556  . oxyCODONE (Oxy IR/ROXICODONE) immediate release tablet 5-10 mg   5-10 mg Oral Q4H PRN Bary Leriche, PA-C   10 mg at 03/14/20 1217  . oxymetazoline (AFRIN) 0.05 % nasal spray 1 spray  1 spray Each Nare BID Bary Leriche, PA-C   1 spray at 03/15/20 0906  . pantoprazole (PROTONIX) EC tablet 40 mg  40 mg Oral BID AC Lovorn, Megan, MD   40 mg at 03/15/20 0645  . phenol (CHLORASEPTIC) mouth spray 1 spray  1 spray Mouth/Throat PRN Love, Pamela S, PA-C      . polyethylene glycol (MIRALAX / GLYCOLAX) packet 17 g  17 g Oral Daily PRN Love, Pamela S, PA-C      . polyethylene glycol (MIRALAX / GLYCOLAX) packet 17 g  17 g Oral Daily Bary Leriche, PA-C   17 g at 03/15/20 0829  . prochlorperazine (COMPAZINE) tablet 5-10 mg  5-10 mg Oral Q6H PRN Bary Leriche, PA-C   5 mg at 03/15/20 1425   Or  . prochlorperazine (COMPAZINE) injection 5-10 mg  5-10 mg Intramuscular Q6H PRN Bary Leriche, PA-C   10 mg at 03/10/20 0142   Or  . prochlorperazine (COMPAZINE) suppository 12.5 mg  12.5 mg Rectal Q6H PRN Love, Pamela S, PA-C      . protein supplement (ENSURE MAX) liquid  11 oz Oral BID BM Love, Pamela S, PA-C   11 oz at 03/15/20 1427  . QUEtiapine (SEROQUEL) tablet 50 mg  50 mg Oral QHS Lovorn, Megan, MD      . senna-docusate (Senokot-S) tablet 2 tablet  2 tablet Oral BID Courtney Heys, MD   2 tablet at 03/15/20 0829  . simethicone (MYLICON) chewable tablet 80 mg  80 mg Oral QID PRN Bary Leriche, PA-C   80 mg at 03/12/20 8675  . sodium  bicarbonate/sodium chloride mouthwash 1080m   Mouth Rinse PRN Ashiya Kinkead R, NP      . sodium chloride (OCEAN) 0.65 % nasal spray 1 spray  1 spray Each Nare PRN Lovorn, Megan, MD      . sodium chloride (OCEAN) 0.65 % nasal spray 1 spray  1 spray Each Nare QID Love, Pamela S, PA-C   1 spray at 03/15/20 1202  . sorbitol 70 % solution 30 mL  30 mL Oral Daily PRN Lovorn, Megan, MD   30 mL at 03/12/20 1158  . sucralfate (CARAFATE) 1 GM/10ML suspension 1 g  1 g Oral TID WC & HS Sanoe Hazan R, NP      . traMADol (ULTRAM) tablet 50 mg  50 mg  Oral Q4H PRN LBary Leriche PA-C   50 mg at 03/09/20 2159  . white petrolatum (VASELINE) gel   Topical PRN Lovorn, Megan, MD        REVIEW OF SYSTEMS:   Constitutional: ( - ) fevers, ( - )  chills , ( - ) night sweats Eyes: ( - ) blurriness of vision, ( - ) double vision, ( - ) watery eyes Ears, nose, mouth, throat, and face: ( + ) mucositis, ( - ) sore throat Respiratory: ( - ) cough, ( - ) dyspnea, ( - ) wheezes Cardiovascular: ( - ) palpitation, ( - ) chest discomfort, ( - ) lower extremity swelling Gastrointestinal:  ( + ) nausea, ( + ) heartburn, ( - ) change in bowel habits Skin: ( - ) abnormal skin rashes Lymphatics: ( - ) new lymphadenopathy, ( - ) easy bruising Neurological: ( - ) numbness, ( - ) tingling, ( - ) new weaknesses Behavioral/Psych: ( - ) mood change, ( - ) new changes  All other systems were reviewed with the patient and are negative.  PHYSICAL EXAMINATION:  GENERAL: Laying in bed, awake and alert, no distress SKIN: skin color, texture, turgor are normal, no rashes or significant lesions OROPHARYNX: Dried blisters on lips, small sores noted on tongue EYES: conjunctiva are pink and non-injected, sclera is injected LUNGS: clear to auscultation and percussion with normal breathing effort HEART: regular rate & rhythm and no murmurs and no lower extremity edema ABDOMEN: soft, non-tender, non-distended, normal bowel sounds Musculoskeletal: no cyanosis of digits and no clubbing.  5/5 strength in the right lower extremity and 3/5 strength in the left lower extremity. PSYCH: alert & oriented x 3, fluent speech NEURO: no focal motor/sensory deficits  LABORATORY DATA:  I have reviewed the data as listed CBC Latest Ref Rng & Units 03/15/2020 03/15/2020 03/14/2020  WBC 4.0 - 10.5 K/uL 4.2 4.2 3.6(L)  Hemoglobin 13.0 - 17.0 g/dL 8.0(L) 8.1(L) 8.1(L)  Hematocrit 39 - 52 % 23.1(L) 23.2(L) 23.0(L)  Platelets 150 - 400 K/uL PLATELET CLUMPS NOTED ON SMEAR, UNABLE TO ESTIMATE 76(L)  54(L)    CMP Latest Ref Rng & Units 03/15/2020 03/14/2020 03/09/2020  Glucose 70 - 99 mg/dL 298(H) 299(H) 263(H)  BUN 6 - 20 mg/dL 40(H) 46(H) 43(H)  Creatinine 0.61 - 1.24 mg/dL 1.24 1.57(H) 1.16  Sodium 135 - 145 mmol/L 122(L) 121(L) 124(L)  Potassium 3.5 - 5.1 mmol/L 4.3 4.7 4.4  Chloride 98 - 111 mmol/L 94(L) 93(L) 95(L)  CO2 22 - 32 mmol/L 18(L) 19(L) 19(L)  Calcium 8.9 - 10.3 mg/dL 7.9(L) 7.8(L) 9.0  Total Protein 6.5 - 8.1 g/dL - 6.5 -  Total Bilirubin 0.3 - 1.2 mg/dL - 0.6 -  Alkaline Phos 38 -  126 U/L - 88 -  AST 15 - 41 U/L - 36 -  ALT 0 - 44 U/L - 30 -     RADIOGRAPHIC STUDIES: I have personally reviewed the radiological images as listed and agreed with the findings in the report pronounced lytic lesions and cord compression in thoracic spine.   DG Abd 1 View  Result Date: 03/13/2020 CLINICAL DATA:  Nausea, vomiting. EXAM: ABDOMEN - 1 VIEW COMPARISON:  March 10, 2020. FINDINGS: The bowel gas pattern is normal. Moderate amount of stool seen in right colon. No radio-opaque calculi or other significant radiographic abnormality are seen. IMPRESSION: Moderate stool burden is noted. No evidence of bowel obstruction or ileus. Electronically Signed   By: Marijo Conception M.D.   On: 03/13/2020 11:31   DG Abd 1 View  Result Date: 03/10/2020 CLINICAL DATA:  Abdominal pain. EXAM: ABDOMEN - 1 VIEW COMPARISON:  Radiograph 03/05/2020. FINDINGS: Mild gaseous distention of large and small bowel in a nonobstructive pattern. Small to moderate stool in the right colon, with diminished stool burden from prior exam. There is no evidence of free intra-abdominal air. No radiopaque calculi. No osseous abnormalities are seen. IMPRESSION: Bowel-gas pattern suggestive of ileus, with slight improvement in gaseous bowel distension over the past few days. Electronically Signed   By: Keith Rake M.D.   On: 03/10/2020 14:13   DG Abd 1 View  Result Date: 03/05/2020 CLINICAL DATA:  Abdominal pain and  bloating. EXAM: ABDOMEN - 1 VIEW COMPARISON:  Abdominal radiograph 03/03/2020, CT 02/14/2020 FINDINGS: No evidence of bowel obstruction on the supine views. Mild gaseous distention of stomach with slight improvement from prior. Mild gaseous small bowel distension and a generalized pattern. Moderate colonic stool burden. No abnormal rectal distention. No radiopaque calculi. No acute osseous abnormalities are seen. IMPRESSION: Ileus bowel gas pattern with mild gaseous distension of small bowel loops and stomach with slight improvement from recent exam. No evidence of developing obstruction. Moderate colonic stool burden. Electronically Signed   By: Keith Rake M.D.   On: 03/05/2020 15:47   DG Abd 1 View  Result Date: 03/03/2020 CLINICAL DATA:  Cord compression.  Left upper quadrant pain. EXAM: ABDOMEN - 1 VIEW COMPARISON:  CT 02/14/2020. FINDINGS: Distended loops of small and large bowel noted suggesting adynamic ileus. Recent CT of the abdomen pelvis of 02/14/2020 did reveal a right inguinal hernia with herniation of fat. No free air. Bony lesions present best identified by prior CT. IMPRESSION: Distended loops of small and large bowel noted suggesting adynamic ileus. Recent CT of the abdomen pelvis of 02/14/2020 however did reveal a right inguinal hernia with herniation of fat. Follow-up exams suggested to demonstrate resolution of bowel distention in order to exclude bowel obstruction. Electronically Signed   By: Marcello Moores  Register   On: 03/03/2020 13:56   MR THORACIC SPINE W WO CONTRAST  Result Date: 02/14/2020 CLINICAL DATA:  Back pain. Compression fracture. Fever with nausea and vomiting. EXAM: MRI THORACIC WITHOUT AND WITH CONTRAST TECHNIQUE: Multiplanar and multiecho pulse sequences of the thoracic spine were obtained without and with intravenous contrast. CONTRAST:  75m GADAVIST GADOBUTROL 1 MMOL/ML IV SOLN COMPARISON:  None. FINDINGS: MRI THORACIC SPINE FINDINGS Alignment:  Normal Vertebrae: There  are numerous bone marrow lesions throughout all visualized levels, but worst at T5, T6 and T9. There is compression deformity of T6 with 50% height loss and 5 mm of retropulsion. There is compression deformity of T9 with approximately 10% height loss and 3 mm of retropulsion.  Cord: There is mild hyperintense T2-weighted signal in the spinal cord at T5. Paraspinal and other soft tissues: There is soft tissue mass is associated with the vertebral lesions at T6 and T9. Otherwise, the paraspinal soft tissues are unremarkable. Disc levels: T5-6: Contrast-enhancing mass replaces most of the vertebral body bone marrow and extends into the pedicles, transverse processes and the right paraspinal tissues. There is also epidural extension that severely narrows the thecal sac and causes mass effect on the spinal cord. The mass also severely narrows the right T5 neural foramen. T8-9: Soft tissue mass replacing much of the bone marrow at T9 with epidural extension mildly narrowing the thecal sac with slight mass effect on the spinal cord. The mass extends into the right neural foramen. IMPRESSION: 1. Numerous bone marrow lesions throughout all visualized levels, but worst at T5, T6 and T9. 2. 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 The T6 lesion severely narrows the thecal sac and deforms the spinal cord. 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 3. Pathologic fractures of T6 and T9 with 3-5 mm of retropulsion. Electronically Signed   By: Ulyses Jarred M.D.   On: 02/14/2020 19:14   IR Fluoro Guide CV Line Right  Result Date: 02/17/2020 CLINICAL DATA:  Renal failure and need for tunneled hemodialysis catheter to begin hemodialysis. EXAM: TUNNELED CENTRAL VENOUS HEMODIALYSIS CATHETER PLACEMENT WITH ULTRASOUND AND FLUOROSCOPIC GUIDANCE ANESTHESIA/SEDATION: 1.0 mg IV Versed; 50 mcg IV Fentanyl. Total Moderate Sedation Time:   23 minutes. The patient's level of  consciousness and physiologic status were continuously monitored during the procedure by Radiology nursing. MEDICATIONS: 2 g IV Ancef. FLUOROSCOPY TIME:  48 seconds. 4.0 mGy. PROCEDURE: The procedure, risks, benefits, and alternatives were explained to the patient. Questions regarding the procedure were encouraged and answered. The patient understands and consents to the procedure. A timeout was performed prior to initiating the procedure. The right neck and chest were prepped with chlorhexidine in a sterile fashion, and a sterile drape was applied covering the operative field. Maximum barrier sterile technique with sterile gowns and gloves were used for the procedure. Local anesthesia was provided with 1% lidocaine. Ultrasound was used to confirm patency of the right internal jugular vein. After creating a small venotomy incision, a 21 gauge needle was advanced into the right internal jugular vein under direct, real-time ultrasound guidance. Ultrasound image documentation was performed. After securing guidewire access, an 8 Fr dilator was placed. A J-wire was kinked to measure appropriate catheter length. A Palindrome tunneled hemodialysis catheter measuring 19 cm from tip to cuff was chosen for placement. This was tunneled in a retrograde fashion from the chest wall to the venotomy incision. At the venotomy, serial dilatation was performed and a 15 Fr peel-away sheath was placed over a guidewire. The catheter was then placed through the sheath and the sheath removed. Final catheter positioning was confirmed and documented with a fluoroscopic spot image. The catheter was aspirated, flushed with saline, and injected with appropriate volume heparin dwells. The venotomy incision was closed with subcuticular 4-0 Vicryl. Dermabond was applied to the incision. The catheter exit site was secured with 0-Prolene retention sutures. COMPLICATIONS: None.  No pneumothorax. FINDINGS: After catheter placement, the tip lies in the  right atrium. The catheter aspirates normally and is ready for immediate use. IMPRESSION: Placement of tunneled hemodialysis catheter via the right internal jugular vein. The catheter tip lies in the right atrium. The catheter is ready for immediate  use. Electronically Signed   By: Aletta Edouard M.D.   On: 02/17/2020 17:22   IR Removal Tun Cv Cath W/O FL  Result Date: 03/01/2020 INDICATION: Patient with history of multiple myeloma with acute kidney injury and placement of tunneled right internal jugular HD catheter on 02/17/2020; now with improving creatinine and urine output. Request received for HD catheter removal. EXAM: REMOVAL TUNNELED CENTRAL VENOUS CATHETER MEDICATIONS: None ANESTHESIA/SEDATION: None FLUOROSCOPY TIME:  None COMPLICATIONS: None immediate. PROCEDURE: Informed written consent was obtained from the patient after a thorough discussion of the procedural risks, benefits and alternatives. All questions were addressed. Maximal Sterile Barrier Technique was utilized including caps, mask, sterile gowns, sterile gloves, sterile drape, hand hygiene and skin antiseptic. A timeout was performed prior to the initiation of the procedure. The patient's right chest and catheter was prepped and draped in a normal sterile fashion. Heparin was removed from both ports of catheter. Using gentle manual traction the cuff of the catheter was exposed and the catheter was removed in it's entirety. Pressure was held till hemostasis was obtained. A sterile dressing was applied. The patient tolerated the procedure well with no immediate complications. IMPRESSION: Successful catheter removal as described above. Read by: Rowe Robert, PA-C Electronically Signed   By: Jacqulynn Cadet M.D.   On: 03/01/2020 13:40   IR US Guide Vasc Access Right  Result Date: 02/17/2020 CLINICAL DATA:  Renal failure and need for tunneled hemodialysis catheter to begin hemodialysis. EXAM: TUNNELED CENTRAL VENOUS HEMODIALYSIS CATHETER  PLACEMENT WITH ULTRASOUND AND FLUOROSCOPIC GUIDANCE ANESTHESIA/SEDATION: 1.0 mg IV Versed; 50 mcg IV Fentanyl. Total Moderate Sedation Time:   23 minutes. The patient's level of consciousness and physiologic status were continuously monitored during the procedure by Radiology nursing. MEDICATIONS: 2 g IV Ancef. FLUOROSCOPY TIME:  48 seconds. 4.0 mGy. PROCEDURE: The procedure, risks, benefits, and alternatives were explained to the patient. Questions regarding the procedure were encouraged and answered. The patient understands and consents to the procedure. A timeout was performed prior to initiating the procedure. The right neck and chest were prepped with chlorhexidine in a sterile fashion, and a sterile drape was applied covering the operative field. Maximum barrier sterile technique with sterile gowns and gloves were used for the procedure. Local anesthesia was provided with 1% lidocaine. Ultrasound was used to confirm patency of the right internal jugular vein. After creating a small venotomy incision, a 21 gauge needle was advanced into the right internal jugular vein under direct, real-time ultrasound guidance. Ultrasound image documentation was performed. After securing guidewire access, an 8 Fr dilator was placed. A J-wire was kinked to measure appropriate catheter length. A Palindrome tunneled hemodialysis catheter measuring 19 cm from tip to cuff was chosen for placement. This was tunneled in a retrograde fashion from the chest wall to the venotomy incision. At the venotomy, serial dilatation was performed and a 15 Fr peel-away sheath was placed over a guidewire. The catheter was then placed through the sheath and the sheath removed. Final catheter positioning was confirmed and documented with a fluoroscopic spot image. The catheter was aspirated, flushed with saline, and injected with appropriate volume heparin dwells. The venotomy incision was closed with subcuticular 4-0 Vicryl. Dermabond was applied to  the incision. The catheter exit site was secured with 0-Prolene retention sutures. COMPLICATIONS: None.  No pneumothorax. FINDINGS: After catheter placement, the tip lies in the right atrium. The catheter aspirates normally and is ready for immediate use. IMPRESSION: Placement of tunneled hemodialysis catheter via the right internal jugular  vein. The catheter tip lies in the right atrium. The catheter is ready for immediate use. Electronically Signed   By: Aletta Edouard M.D.   On: 02/17/2020 17:22   DG CHEST PORT 1 VIEW  Result Date: 02/25/2020 CLINICAL DATA:  Left-sided rib pain, back pain EXAM: PORTABLE CHEST 1 VIEW COMPARISON:  01/26/2020 FINDINGS: Single frontal view of the chest demonstrates a stable cardiac silhouette. Left lower lobe consolidation is seen within the retrocardiac region. No effusion or pneumothorax. Right internal jugular dialysis catheter tip overlies superior vena cava. Small lytic lesions are seen throughout the thoracic cage and left scapula. The lytic thoracic spine lesion seen on recent CT are more difficult to appreciate on portable x-ray. IMPRESSION: 1. Dense left lower lobe consolidation, favor atelectasis. 2. Diffuse lytic lesions throughout the visualized bony structures, consistent with multiple myeloma. Electronically Signed   By: Randa Ngo M.D.   On: 02/25/2020 17:52   CT BONE MARROW BIOPSY & ASPIRATION  Result Date: 02/19/2020 CLINICAL DATA:  Clinical suspicion multiple myeloma and need for bone marrow biopsy. EXAM: CT GUIDED BONE MARROW ASPIRATION AND BIOPSY ANESTHESIA/SEDATION: Versed 1.0 mg IV, Fentanyl 50 mcg IV Total Moderate Sedation Time:   14 minutes. The patient's level of consciousness and physiologic status were continuously monitored during the procedure by Radiology nursing. PROCEDURE: The procedure risks, benefits, and alternatives were explained to the patient. Questions regarding the procedure were encouraged and answered. The patient understands  and consents to the procedure. A time out was performed prior to initiating the procedure. The right gluteal region was prepped with chlorhexidine. Sterile gown and sterile gloves were used for the procedure. Local anesthesia was provided with 1% Lidocaine. Under CT guidance, an 11 gauge On Control bone cutting needle was advanced from a posterior approach into the right iliac bone. Needle positioning was confirmed with CT. Initial non heparinized and heparinized aspirate samples were obtained of bone marrow. Core biopsy was performed via the On Control drill needle. COMPLICATIONS: None FINDINGS: Inspection of initial aspirate did reveal visible particles. Intact core biopsy sample was obtained. IMPRESSION: CT guided bone marrow biopsy of right posterior iliac bone with both aspirate and core samples obtained. Electronically Signed   By: Aletta Edouard M.D.   On: 02/19/2020 12:21   VAS [place] LOWER EXTREMITY VENOUS (DVT)  Result Date: 03/03/2020  Lower Venous DVTStudy Indications: Immobility.  Performing Technologist: Griffin Basil RCT RDMS  Examination Guidelines: A complete evaluation includes B-mode imaging, spectral Doppler, color Doppler, and power Doppler as needed of all accessible portions of each vessel. Bilateral testing is considered an integral part of a complete examination. Limited examinations for reoccurring indications may be performed as noted. The reflux portion of the exam is performed with the patient in reverse Trendelenburg.  +---------+---------------+---------+-----------+----------+--------------+ RIGHT    CompressibilityPhasicitySpontaneityPropertiesThrombus Aging +---------+---------------+---------+-----------+----------+--------------+ CFV      Full           Yes      Yes                                 +---------+---------------+---------+-----------+----------+--------------+ SFJ      Full                                                         +---------+---------------+---------+-----------+----------+--------------+  FV Prox  Full                                                        +---------+---------------+---------+-----------+----------+--------------+ FV Mid   Full                                                        +---------+---------------+---------+-----------+----------+--------------+ FV DistalFull                                                        +---------+---------------+---------+-----------+----------+--------------+ PFV      Full                                                        +---------+---------------+---------+-----------+----------+--------------+ POP      Full           Yes      Yes                                 +---------+---------------+---------+-----------+----------+--------------+ PTV      Full                                                        +---------+---------------+---------+-----------+----------+--------------+ PERO     Full                                                        +---------+---------------+---------+-----------+----------+--------------+   +---------+---------------+---------+-----------+----------+--------------+ LEFT     CompressibilityPhasicitySpontaneityPropertiesThrombus Aging +---------+---------------+---------+-----------+----------+--------------+ CFV      Full           Yes      Yes                                 +---------+---------------+---------+-----------+----------+--------------+ SFJ      Full                                                        +---------+---------------+---------+-----------+----------+--------------+ FV Prox  Full                                                        +---------+---------------+---------+-----------+----------+--------------+  FV Mid   Full                                                         +---------+---------------+---------+-----------+----------+--------------+ FV DistalFull                                                        +---------+---------------+---------+-----------+----------+--------------+ PFV      Full                                                        +---------+---------------+---------+-----------+----------+--------------+ POP      Full           Yes      Yes                                 +---------+---------------+---------+-----------+----------+--------------+ PTV      Full                                                        +---------+---------------+---------+-----------+----------+--------------+ PERO     Full                                                        +---------+---------------+---------+-----------+----------+--------------+     Summary: RIGHT: - There is no evidence of deep vein thrombosis in the lower extremity.  - No cystic structure found in the popliteal fossa.  LEFT: - There is no evidence of deep vein thrombosis in the lower extremity.  - No cystic structure found in the popliteal fossa.  *See table(s) above for measurements and observations. Electronically signed by Monica Martinez MD on 03/03/2020 at 3:26:05 PM.    Final     ASSESSMENT & PLAN Jeanluc Wegman 54 y.o. male with medical history significant for HTN and HLD who presented with back pain and was found to have cord compression and lytic lesions concerning for multiple myeloma. The patient's imaging and blood work are most concerning for multiple myeloma.  Interestingly the M spike and the serum free light chain ratio is relatively modest compared to the extent of the damage seen on CT scan.  The patient had significant hypercalcemia upon presentation. Bone marrow biopsy was performed on 02/19/2020 and was consistent with a plasma cell neoplasm.  Cytogenetics were normal.  He received radiation for impending cord compression which was completed on  03/02/2020.   Recommend CyBorD chemotherapy.  Since the patient will remain in inpatient rehab until about 03/30/2020 )has been extended by 1 week), we will go ahead and initiate chemotherapy starting Tuesday, 03/15/2020.  While inpatient, will administer subcutaneous Velcade  and dexamethasone and will hold off on Cytoxan until after he is discharged.  SPEP 02/15/2020: M protein 0.9 SFLC: kappa 37.5, Lambda 9.9, ratio 3.79 UPEP: 02/17/2020: M spike not observed.  Immunofixation showed IgG monoclonal protein with kappa light chain specificity   # Lytic Lesions Througout Skeleton/Hypercalcemia # Pathologic Fractures of Spine/Soft Tissue Masses in Spine #Concern for Multiple Myeloma --bone marrow biopsy performed 02/19/2020 to confirm diagnosis; results consistent with plasma cell neoplasm.  Normal cytogenetics. --M protein and Kappa ratio are modestly elevated compared the the extent of damage seen on CT/MRI. 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  We will continue to closely monitor and consider additional bisphosphonate therapy as an outpatient. --The patient has completed radiation to the impending cord compression.  Per radiation oncology, we are tapering dexamethasone.  Recommended taper is for dexamethasone 4 mg every 12 hours x1 week then 2 mg every 12 hours x1 week, and then 2 mg daily for 1 week, and then 2 mg every other day x2 weeks and then stop. He is currently received 4 mg BID and will hold that today as he will received 40 mg today with Velcade. Resume 4 mg BID 9/8. --Recommend for the patient to proceed with CyBorD chemotherapy.  Given his continued hospitalization, we will plan to administer the first dose of Velcade today.  We will hold on Cytoxan until after he is discharged. --We again discussed his diagnosis and treatment  recommendation.  I reviewed the side effects of Velcade including but not limited to diarrhea and peripheral neuropathy.  The patient agrees to proceed and has signed an informed consent which was placed on a shadow chart.  IV team to administer Velcade today.Marland Kitchen --He started acyclovir 400 mg daily on 03/15/2020.  Mucositis/Mouth Sores --Unrelated to chemo as this issue predated the chemo administration --Recommend supportive care --I have started Magic mouthwash and sodium bicarbonate rinses --Unsure if some of his symptoms are due to esophagitis, but I have also started Carafate.  --Vaseline to lips to maintain moisture.  Intermittent pancytopenia --Likely due to underlying malignancy --Monitor periodically --No need to transfuse unless hemoglobin <7 or Platelets <10,000  Mikey Bussing, DNP, AGPCNP-BC, AOCNP Mon/Tues/Thurs/Fri 7am-5pm; Off Wednesdays Cell: [phone]  03/15/2020 3:45 PM

## 2020-03-15 NOTE — Progress Notes (Signed)
PHYSICAL MEDICINE & REHABILITATION PROGRESS NOTE   Subjective/Complaints:    Pt reports slept very poorly last 2 nights- mind racing.   Also feels like blurry vision, but sounds like fatigue related.   Not drinking much because doesn't like ice- "too cold".  Will place note to remind staff no ice.     ROS:  Pt denies SOB, abd pain, CP, N/V/C/D, and vision changes      Objective:   DG Abd 1 View  Result Date: 03/13/2020 CLINICAL DATA:  Nausea, vomiting. EXAM: ABDOMEN - 1 VIEW COMPARISON:  March 10, 2020. FINDINGS: The bowel gas pattern is normal. Moderate amount of stool seen in right colon. No radio-opaque calculi or other significant radiographic abnormality are seen. IMPRESSION: Moderate stool burden is noted. No evidence of bowel obstruction or ileus. Electronically Signed   By: Marijo Conception M.D.   On: 03/13/2020 11:31   Recent Labs    03/14/20 0611  WBC 3.6*  HGB 8.1*  HCT 23.0*  PLT 54*   Recent Labs    03/14/20 0611  NA 121*  K 4.7  CL 93*  CO2 19*  GLUCOSE 299*  BUN 46*  CREATININE 1.57*  CALCIUM 7.8*    Intake/Output Summary (Last 24 hours) at 03/15/2020 0832 Last data filed at 03/15/2020 0655 Gross per 24 hour  Intake 440 ml  Output 2325 ml  Net -1885 ml     Physical Exam: Vital Signs Blood pressure 136/77, pulse 97, temperature 98.2 F (36.8 C), resp. rate 18, height '5\' 3"'  (1.6 m), weight 60.9 kg, SpO2 99 %. Constitutional: sitting up in bed- lacking energy/initiaiton, interpretor at bedside and OT, NAD. HEENT: EOMI, dry lips- with black blood dried on lips-  Neck: supple Cardiovascular: borderline tachycardia- regular rhythm    Respiratory/Chest: CTA B/L- no W/R/R- good air movement  GI/Abdomen: Soft, NT, ND, (+)BS  Ext: no clubbing, cyanosis, or edema- (+) tenting Psych: lack of energy Musculoskeletal: -right shoulder tender with IR/ER, low back TTP    Cervical back: Neck supple. No rigidity.     Comments: UEs 5/5 in  deltoid, biceps, triceps, WE, grip and finger abd B/L- pain with biceps testing due to rib fx's LEs- RLE- HF 3-/5, KE 3+/5, DF 4-/5, PF 5-/5 LLE- HF 2/5, KE 3/5, DF 2/5, PF 5-/5 --motor exam stable Skin:    General: Skin is warm and dry.  Large purple bruise on sacrum- a tiny puncture mark under it- healing well B/L knee brown squishy bruises- improving/healing  2 radiation tags- chest and R side  Neurological: Ox3.   Light touch intact in all 4 extremities B/L    Assessment/Plan: 1. 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  Physiatrist is providing close team supervision and 24 hour management of active medical problems listed below.  Physiatrist and rehab team continue to assess barriers to discharge/monitor patient progress toward functional and medical goals  Care Tool:  Bathing    Body parts bathed by patient: Right arm, Left arm, Chest, Abdomen, Front perineal area, Right upper leg, Left upper leg, Right lower leg, Left lower leg, Face   Body parts bathed by helper: Buttocks     Bathing assist Assist Level: Moderate Assistance - Patient 50 - 74%     Upper Body Dressing/Undressing Upper body dressing   What is the patient wearing?: Pull over shirt  Upper body assist Assist Level: Set up assist    Lower Body Dressing/Undressing Lower body dressing      What is the patient wearing?: Pants     Lower body assist Assist for lower body dressing: Moderate Assistance - Patient 50 - 74%     Toileting Toileting    Toileting assist Assist for toileting: Maximal Assistance - Patient 25 - 49% Assistive Device Comment: urinal   Transfers Chair/bed transfer  Transfers assist     Chair/bed transfer assist level: Minimal Assistance - Patient > 75%     Locomotion Ambulation   Ambulation assist    Ambulation activity did not occur: Safety/medical concerns          Walk 10 feet activity   Assist  Walk 10 feet activity did not occur: Safety/medical concerns        Walk 50 feet activity   Assist Walk 50 feet with 2 turns activity did not occur: Safety/medical concerns         Walk 150 feet activity   Assist Walk 150 feet activity did not occur: Safety/medical concerns         Walk 10 feet on uneven surface  activity   Assist Walk 10 feet on uneven surfaces activity did not occur: Safety/medical concerns         Wheelchair     Assist Will patient use wheelchair at discharge?: Yes Type of Wheelchair: Manual    Wheelchair assist level: Independent Max wheelchair distance: 100    Wheelchair 50 feet with 2 turns activity    Assist        Assist Level: Independent   Wheelchair 150 feet activity     Assist      Assist Level: Supervision/Verbal cueing   Blood pressure 136/77, pulse 97, temperature 98.2 F (36.8 C), resp. rate 18, height '5\' 3"'  (1.6 m), weight 60.9 kg, SpO2 99 %.   Medical Problem List and Plan: 1.  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  Antithrombotics: -DVT/anticoagulation:  Pharmaceutical: Lovenox   8/27- at high risk for DVT due to SCI and cancer being treated  9/7- will hold Lovenox due to plts of 54k             --BLE dopplers negative for DVT             -antiplatelet therapy: N/a 3. Pain Management: Continue oxycodone --increase to 10 mg q 4 hrs prn--d/c IV dilaudid.  9/1- pt still having pain- encouraged to take meds if need be. Explained why he still having pain.  9/2- will add Lidocaine patch to R shoulder   9/6 pain control seems reasonable 4. Mood: LCSW to follow for evaluation and support.               -antipsychotic agents: N/a 5. Neuropsych: This patient is capable of making decisions on his own behalf. 6. Skin/Wound Care: Routine pressure relief measures.  7. Fluids/Electrolytes/Nutrition: Strict I/O. Check lytes in am.  8. Acute renal failure: Improving 81/4.92-->62/1.49  8/30- Cr 1.37 and BUN 50  8/31- Cr 1.25 and Bun down to 45- much better  9/1- Cr 1.17  9/6 - Cr up to 1.57 today, hold remeron,  follow up BMET tomorrow  9/7- will give NS IVFs since looks so dry- 100cc/hr x 1 day and told him to push PO fluids- 8 cups/day- and placed sign no ice.  9. Hyponatremia: Recurrent hyponatremia. Improving to 124 on 8/28.  8/30- Na 127 today  9/6 Na down to 121 today. 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 New diagnosis T2DM: Hgb A1c-6.5. BS poorly controlled due to steriods as well as Ensure tid between meals. changed ensure to Ensure Max --continue Lantus 10 units at bedtime with 3 units meal coverage tid as well as SSI for elevated BS.    CBG (last 3)  Recent Labs    03/14/20 1637 03/14/20 2058 03/15/20 0628  GLUCAP 303* 270* 265*    9/1- will increase Lantus to 16 units due to elevated BGs  9/2- will increase lantus again- to 18 units  9/3- will wait til tomorrow to increase Lantus  9/4- BGs keep going UP no matter how much increase Lantus- and pt said not eating a lot- will increase Lantus again.  9/6 increase lantus to 26u as sugars still poorly controlled  9/7- wait until tomorrow to increase 11. Constipation: Continue miralax am/change senna to lunch. KUB done showing ileus?  Wife/patient reports lot of gas and that he has been having BM daily. Pt says as well, BM daily.   8/27- adynamic ileus yesterday- will make sure pt has BM and recheck KUB in AM  8/29: KUB reviewed and shows moderate stool burden- he is having BM now  9/3- still has ileus gas pattern- will give Mg citrate to get him cleaned out this afternoon  9/4- Large BM  yesterday and this AM- will order enema just in case  9/5- 2 extra large BMs yesterday- will order Sorbitol because still nauseated AND abd distended- KUB shows moderate stool burden  9/6 repeat sorbitol followed by SSE today if still no bm  9/7- Stomach less distended- no results with enema last night- will monitor 12. Mild BPH: will monitor voiding with PVR checks.  13. 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 Pathologic Fx T6 and T9:  Treated with XRT 10/10 and high dose steroids.  15. L foot drop- get prevalon boot on L at night  9/1- told wasn't in room- will order another PRAFO  16. Hyponatremia/Hyperkalemia  8/27- Na 122 this AM- will call Nephrology, esp because K+ 4.8 and was on Lokelma BID- changed to daily and held dose this AM.  8/3-- can stop Lokelma per nephrology and Na up to 127- doing better-   17. Hiccups- for 1 week  8/27- on Thorazine- will add baclofen 10 mg TID prn for hiccups  8/30- will change to QID and schedule it- also asked nursing to give Thorazine when notices hiccups, if due- to try and help.   9/2- said hiccups much better  9/5-6- hiccups coming back intermittently again- con't regimen 18. Thrombocytopenia  8/30- plts dropping- are 79k- if drop again by Wednesday, will need to see what else is possible other than Lovenox- will check with Heme/Onc.    9/6 Plts down to 54k today---recheck tomorrow  9/7- labs haven't been drawn yet- will hold lovenox due to low plts 18. Dispo  8/30- pt/family want to have discussion Tuesday 1-1:30 pm to discuss medical issues- will try and do this.   9/1- no interpretor there- will do Thursday 19. Dizziness  9/1- - will encourage to push  fluids and if doesn't fix, will order abd binder  9/3- vertigo eval by PT showed nothing- likely some orthostatic hypotension that we don't see/catch- will try midodrine early in AM 5 mg and see how he does.   9/4- slightly better  9/5- will increase midodrine to 7.5 mg BID with  breakfast and lunch- had an episode of orthostasis with therapy today- this AM-  20. N/V- vomited 3x this AM  9/2- could be chemo- could be meds- will add Zofran prn ; con't compazine; and alternate- ask nursing to give.  9/3 check on Chemo -with PA   9/4- to start Chemo Tuesday- from ileus 21. GERD  9/2- will increase PPI to BID 22. Insomnia  9/2- increase trazodone to 100 mg QHS  9/3- slept better  9/6-  Hold remeron as noted above. Continue trazodone prn 23. Nose bleeds-  9/5-  Afrin prn and saline nasal spray.   9/7- likely due to low plts        LOS: 12 days A FACE TO FACE EVALUATION WAS PERFORMED  Abbee Cremeens 03/15/2020, 8:32 AM

## 2020-03-15 NOTE — Patient Care Conference (Signed)
Inpatient RehabilitationTeam Conference and Plan of Care Update Date: 03/15/2020   Time: 11:06 AM    Patient Name: Trevor Shelton      Medical Record Number: [id number]  Date of Birth: April 30, 1966 Sex: Male         Room/Bed: 4N82N/5A21H-08 Payor Info: Payor: /    Admit Date/Time:  03/03/2020 12:26 PM  Primary Diagnosis:  Incomplete paraplegia [facility])  Hospital Problems: Principal Problem:   Incomplete paraplegia [facility]) Active Problems:   Cord compression [facility])    Expected Discharge Date: Expected Discharge Date: 03/30/20  Team Members Present: Physician leading conference: Dr. Courtney Heys Care Coodinator Present: Loralee Pacas, LCSWA;Sharleen Szczesny Creig Hines, RN, BSN, CRRN Nurse Present: Serena Croissant, LPN PT Present: Excell Seltzer, PT OT Present: Roanna Epley, [place], OT PPS Coordinator present : Ileana Ladd, Burna Mortimer, SLP     Current Status/Progress Goal Weekly Team Focus  Bowel/Bladder   Patient is continent bladder, and incontine t at times of bowel, LBM 03/14/20  To remain continent.  Assess toileting needs and address concerns,in a timely manner   Swallow/Nutrition/ Hydration             ADL's   UB bathing/dressing-supervisoin; LB bathing-mod A; LB dressing-mod A sit sit<>stand in [place]; SB tranfsers-min A; squat piviot transfers-max A  min A overall  functional transfers, LB bathing/dressing, BADL retraining, education, activity tolerance   Mobility   min/mod bed mobility, min/mod SB transfer, Supervision to mod I w/c mobility, min to +2 to stand to stedy or standing frame  min A overall at w/c level  endurance, standing as able, LE NMR   Communication             Safety/Cognition/ Behavioral Observations            Pain   Pain remains to bilateral rib cage, and bilateral sides, ratinf on pain scale 5-6/10 , medicate as prescribe, Continue Lidoerm patch as ordered  To bring pain level 2/10.  Assess and reassess pain and prn QS/PRN   Skin   Lower back  incision healing well, skin intact, ecchymosis to bilateral regions healing well  Promote healing and preventing any breakdown from occurring.  Skin assess QS/PRN     Discharge Planning:  Pt is uninsured. D/c to home with support from his wife and dtr.   Team Discussion: Continent of bladder with incontinent episodes of bowel occasionally. C/o nausea, compazine with relief. Min/mod assist for transfers, slow to progress. Max assist with blocking knees for transfers. Mod assist sit to stand. Patient has min assist goals at W/C level. CBG's are not controlled. Patient on target to meet rehab goals: yes, progressing at a very slow rate.  *See Care Plan and progress notes for long and short-term goals.   Revisions to Treatment Plan:  Patient is very dry, MD added IV fluids and Midodrine.  Teaching Needs: Continue with family education.  Current Barriers to Discharge: Inaccessible home environment, Home enviroment access/layout, Incontinence, Weight bearing restrictions and Pending chemo/radiation  Possible Resolutions to Barriers: Patient needs ramp to enter home, to begin chemo this week, continue medication regimen for pain, continue to educate patient and spouse on diagnosis. Continue diabetes education as CBG's are not well controlled.      Medical Summary Current Status: Hasn't gotten IV yet- for IVFs- large BM yesterday with enema and BM this AM; chemo to start today; Plts 54k; seroquel for sleep; stop lovenox due to low plts  Barriers to Discharge: Decreased family/caregiver support;Home enviroment access/layout;Neurogenic Bowel &  Bladder;Weight bearing restrictions;Nutrition means;Pending chemo/radiation;Wound care;Medical stability;Other (comments);New diabetic  Barriers to Discharge Comments: uninsured- needs ramp; starting Chemo today; very dry- Cr up to 1.5 and BUN 46- IVFs ordered. -listless- trying to treat; lots of dizziness still Possible Resolutions to [employer] Focus:  slow progress - min-mod A SL transfers- standing not functional yet; not connecting about prognosis-family brings in food- 9/22 d/c -move out 1 week.   Continued Need for Acute Rehabilitation Level of Care: The patient requires daily medical management by a physician with specialized training in physical medicine and rehabilitation for the following reasons: Direction of a multidisciplinary physical rehabilitation program to maximize functional independence : Yes Medical management of patient stability for increased activity during participation in an intensive rehabilitation regime.: Yes Analysis of laboratory values and/or radiology reports with any subsequent need for medication adjustment and/or medical intervention. : Yes   I attest that I was present, lead the team conference, and concur with the assessment and plan of the team.   Cristi Loron 03/15/2020, 3:22 PM

## 2020-03-16 ENCOUNTER — Inpatient Hospital Stay (HOSPITAL_COMMUNITY): Payer: Self-pay

## 2020-03-16 ENCOUNTER — Inpatient Hospital Stay (HOSPITAL_COMMUNITY): Payer: Self-pay | Admitting: *Deleted

## 2020-03-16 ENCOUNTER — Inpatient Hospital Stay (HOSPITAL_COMMUNITY): Payer: Self-pay | Admitting: Physical Therapy

## 2020-03-16 LAB — GLUCOSE, CAPILLARY
Glucose-Capillary: 253 mg/dL — ABNORMAL HIGH (ref 70–99)
Glucose-Capillary: 270 mg/dL — ABNORMAL HIGH (ref 70–99)
Glucose-Capillary: 294 mg/dL — ABNORMAL HIGH (ref 70–99)
Glucose-Capillary: 297 mg/dL — ABNORMAL HIGH (ref 70–99)

## 2020-03-16 MED ORDER — ENOXAPARIN SODIUM 40 MG/0.4ML ~~LOC~~ SOLN
40.0000 mg | SUBCUTANEOUS | Status: DC
  Administered 2020-03-16 – 2020-03-17 (×2): 40 mg via SUBCUTANEOUS
  Filled 2020-03-16 (×3): qty 0.4

## 2020-03-16 MED ORDER — SALINE SPRAY 0.65 % NA SOLN
2.0000 | NASAL | Status: DC
  Administered 2020-03-16 – 2020-03-19 (×44): 2 via NASAL
  Filled 2020-03-16 (×2): qty 44

## 2020-03-16 MED ORDER — SODIUM CHLORIDE 0.9 % IV SOLN: INTRAVENOUS | Status: AC

## 2020-03-16 MED ORDER — FLUCONAZOLE 100 MG PO TABS
200.0000 mg | ORAL_TABLET | Freq: Every day | ORAL | Status: AC
  Administered 2020-03-16: 200 mg via ORAL
  Filled 2020-03-16: qty 2

## 2020-03-16 MED ORDER — FLUCONAZOLE 100 MG PO TABS
100.0000 mg | ORAL_TABLET | Freq: Every day | ORAL | Status: DC
  Administered 2020-03-17 – 2020-03-19 (×3): 100 mg via ORAL
  Filled 2020-03-16 (×3): qty 1

## 2020-03-16 MED ORDER — INSULIN GLARGINE 100 UNIT/ML ~~LOC~~ SOLN
30.0000 [IU] | Freq: Every day | SUBCUTANEOUS | Status: DC
  Administered 2020-03-16 – 2020-03-18 (×3): 30 [IU] via SUBCUTANEOUS
  Filled 2020-03-16 (×4): qty 0.3

## 2020-03-16 MED ORDER — SALINE SPRAY 0.65 % NA SOLN
2.0000 | NASAL | Status: DC | PRN
  Filled 2020-03-16: qty 44

## 2020-03-16 NOTE — Progress Notes (Signed)
Physical Therapy Session Note  Patient Details  Name: Trevor Shelton MRN: [id number] Date of Birth: 1966-04-28  Today's Date: 03/16/2020 PT Individual Time: 1000-1100 PT Individual Time Calculation (min): 60 min   Short Term Goals: Week 2:  PT Short Term Goal 1 (Week 2): pt will roll independently using momentum strategy PT Short Term Goal 2 (Week 2): Pt will perform transfers w/LRAD w/min assist including uneven surfaces PT Short Term Goal 3 (Week 2): Pt will maintain static stand in stedy w/midline posture maintained x 30sec PT Short Term Goal 4 (Week 2): Pt will ascend/descend ramp w/wc w/min assist PT Short Term Goal 5 (Week 2): pt will perform side to sit on mat w/min assist  Skilled Therapeutic Interventions/Progress Updates:    Pt received seated in bed, agreeable to PT session. No complaints of pain, does report feeling significantly fatigued this date but agreeable to participate in therapy session as able. Supine to sit with min A for LLE management. Slide board transfer bed to/from chair throughout session with min to mod A needed this date. Manual w/c propulsion up to 150 ft with use of BUE at mod I level. Seated balance EOM with close SBA for balance performing ball toss progressing to ball toss via chest pass. Pt able to perform 3 x 15 reps to fatigue with semi-reclined rest break needed between rounds. Pt expresses his frustration with ongoing weakness and fatigue. Explained symptoms of chemotherapy as well as his diagnosis and therapy sessions targeted on improving strength and endurance as able but he will experience these symptoms as a side effect. Seated "zoom ball" pass x 15 reps to fatigue. Seated alt L/R BLE soccer ball kicks x 15 reps each to fatigue. Pt requests to return to bed at end of session. Sit to supine mod A for BLE management. Pt's wife and sister present at end of session with questions about pt's current medical status. Notified PA that family requesting to speak with  medical team. Pt left seated in bed with needs in reach, family present at end of session. Interpreter present during therapy session.  Therapy Documentation Precautions:  Precautions Precautions: Back, Fall (per chart review, no specific back precautions however encouraged per pt comfort) Precaution Comments: Significant BLE weakness and ataxia-like movements (LLE>RLE); back and rib precautions for comfort due to fxs Restrictions Weight Bearing Restrictions: No    Therapy/Group: Individual Therapy   Excell Seltzer, PT, DPT  03/16/2020, 12:05 PM

## 2020-03-16 NOTE — Progress Notes (Signed)
Occupational Therapy Session Note  Patient Details  Name: Trevor Shelton MRN: [id number] Date of Birth: 06/16/1966  Today's Date: 03/16/2020 OT Individual Time: 0800-0900 OT Individual Time Calculation (min): 60 min    Short Term Goals: Week 2:  OT Short Term Goal 1 (Week 2): Pt will be able to push to stand with a RW with max A. OT Short Term Goal 2 (Week 2): Pt will perform squat pivot transfers with mod A OT Short Term Goal 3 (Week 2): Pt will perform toileting tasks with mod A (2/3)  Skilled Therapeutic Interventions/Progress Updates:    Pt resting in bed upon arrival with MD and interpreter present. Pt commented that he didn't sleep well during the night and he feels weaker then previous day.  Pt also stated that RN remained in room on computer for approx 1.5 hours after administering medications and it was difficulty to sleep with her on the computer. Pt required max A for squat pivot transfer to w/c to engage in bathing/dressing. Pt incontinent of bladder secondary to urgency and unable to get urinal quick enough. Pt requested to use toilet and transferred to [facility] over toilet with Stedy.  Mod A for sit<>stand in [place]. Pt relies on UE strength to pull to standing and remain standing. Pt required tot A for toilet hygiene.  Pt transferred back to EOB and returned to supine. Pt donned pants at bed level with max A. Pt remained in bed with all needs within reach and bed alarm activated.  Therapy Documentation Precautions:  Precautions Precautions: Back, Fall (per chart review, no specific back precautions however encouraged per pt comfort) Precaution Comments: Significant BLE weakness and ataxia-like movements (LLE>RLE); back and rib precautions for comfort due to fxs Restrictions Weight Bearing Restrictions: No  Pain: Pt c/o increased fatigue as well as stomach discomfort and difficulty swallowing; MD present and aware  Therapy/Group: Individual Therapy  Leroy Libman 03/16/2020,  9:22 AM

## 2020-03-16 NOTE — Progress Notes (Addendum)
[place] PHYSICAL MEDICINE & REHABILITATION PROGRESS NOTE   Subjective/Complaints:    Pt c/o hard to swallow- oropharynx is red/erythematous- don't see thrush currently.   LBM yesterday x2- nausea and constipation better.   Feels weaker this AM due to chemo, per pt.  Had a bladder accident yesterday- due to being asleep.  Says hard to breathe through mouth and nose- due to something in his nose and in his throat.  Feels something getting stuck in throat as well.  Is painful- that's why he's not drinking.   Will try Diflucan to treat possible thrush.  Cr a little better- will give another day of 75cc/hr-     ROS:   Pt denies SOB, abd pain, CP, N/V/C/D, and vision changes     Objective:   No results found. Recent Labs    03/14/20 0611 03/15/20 0810  WBC 3.6* 4.2  4.2  HGB 8.1* 8.0*  8.1*  HCT 23.0* 23.1*  23.2*  PLT 54* PLATELET CLUMPS NOTED ON SMEAR, UNABLE TO ESTIMATE  76*   Recent Labs    03/14/20 0611 03/15/20 0810  NA 121* 122*  K 4.7 4.3  CL 93* 94*  CO2 19* 18*  GLUCOSE 299* 298*  BUN 46* 40*  CREATININE 1.57* 1.24  CALCIUM 7.8* 7.9*    Intake/Output Summary (Last 24 hours) at 03/16/2020 0848 Last data filed at 03/16/2020 0814 Gross per 24 hour  Intake 2200 ml  Output 1950 ml  Net 250 ml     Physical Exam: Vital Signs Blood pressure 119/73, pulse (!) 107, temperature 97.8 F (36.6 C), temperature source Oral, resp. rate 17, height '5\' 3"'  (1.6 m), weight 57.4 kg, SpO2 99 %. Constitutional: sitting up in bed-OT and interpretor in room, lacking energy still, NAD  HEENT: EOMI, dry lips- with black blood dried on lips- lips have blood, but not as dry- scabs in medial aspect of inner nose B/L- also erythema in oropharynx Neck: supple Cardiovascular: tachycardia- regular rhythm  Respiratory/Chest: CTA B/L- no W/R/R- good air movement GI/Abdomen:Soft, NT, less distended, (+)BS -  Ext: no clubbing, cyanosis, or edema- (+) tenting Psych: lack of  energy- listless Musculoskeletal: -right shoulder tender with IR/ER, low back TTP    Cervical back: Neck supple. No rigidity.     Comments: UEs 5/5 in deltoid, biceps, triceps, WE, grip and finger abd B/L- pain with biceps testing due to rib fx's LEs- RLE- HF 3-/5, KE 3+/5, DF 4-/5, PF 5-/5 LLE- HF 2/5, KE 3/5, DF 2/5, PF 5-/5 --motor exam stable Skin:    General: Skin is warm and dry.  Large purple bruise on sacrum- a tiny puncture mark under it- healing well B/L knee brown squishy bruises- improving/healing  2 radiation tags- chest and R side  Neurological: Ox3.   Light touch intact in all 4 extremities B/L    Assessment/Plan: 1. 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  Physiatrist is providing close team supervision and 24 hour management of active medical problems listed below.  Physiatrist and rehab team continue to assess barriers to discharge/monitor patient progress toward functional and medical goals  Care Tool:  Bathing    Body parts bathed by patient: Right arm, Left arm, Chest, Abdomen, Front perineal area, Right upper leg, Left upper leg, Right lower leg, Left lower leg, Face   Body parts bathed by helper: Buttocks  Bathing assist Assist Level: Moderate Assistance - Patient 50 - 74%     Upper Body Dressing/Undressing Upper body dressing   What is the patient wearing?: Pull over shirt    Upper body assist Assist Level: Set up assist    Lower Body Dressing/Undressing Lower body dressing      What is the patient wearing?: Pants     Lower body assist Assist for lower body dressing: Moderate Assistance - Patient 50 - 74%     Toileting Toileting    Toileting assist Assist for toileting: Maximal Assistance - Patient 25 - 49% Assistive Device Comment: urinal   Transfers Chair/bed  transfer  Transfers assist     Chair/bed transfer assist level: Moderate Assistance - Patient 50 - 74%     Locomotion Ambulation   Ambulation assist   Ambulation activity did not occur: Safety/medical concerns          Walk 10 feet activity   Assist  Walk 10 feet activity did not occur: Safety/medical concerns        Walk 50 feet activity   Assist Walk 50 feet with 2 turns activity did not occur: Safety/medical concerns         Walk 150 feet activity   Assist Walk 150 feet activity did not occur: Safety/medical concerns         Walk 10 feet on uneven surface  activity   Assist Walk 10 feet on uneven surfaces activity did not occur: Safety/medical concerns         Wheelchair     Assist Will patient use wheelchair at discharge?: Yes Type of Wheelchair: Manual    Wheelchair assist level: Independent Max wheelchair distance: 150'    Wheelchair 50 feet with 2 turns activity    Assist        Assist Level: Independent   Wheelchair 150 feet activity     Assist      Assist Level: Independent   Blood pressure 119/73, pulse (!) 107, temperature 97.8 F (36.6 C), temperature source Oral, resp. rate 17, height '5\' 3"'  (1.6 m), weight 57.4 kg, SpO2 99 %.   Medical Problem List and Plan: 1.  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  Antithrombotics: -DVT/anticoagulation:  Pharmaceutical: Lovenox   8/27- at high risk for DVT due to SCI and cancer being treated  9/7- will hold Lovenox due to plts of 54k             --BLE dopplers negative for DVT             -antiplatelet therapy: N/a 3. Pain Management: Continue oxycodone --increase to 10 mg q 4 hrs prn--d/c IV dilaudid.  9/1- pt still having pain- encouraged to take meds if need be. Explained  why he still having pain.  9/2- will add Lidocaine patch to R shoulder   9/6 pain control seems reasonable 4. Mood: LCSW to follow for evaluation and support.              -antipsychotic agents: N/a 5. Neuropsych: This patient is capable of making decisions on his own behalf. 6. Skin/Wound Care: Routine pressure relief measures.  7. Fluids/Electrolytes/Nutrition: Strict I/O. Check lytes in am.  8. Acute renal failure: Improving 81/4.92-->62/1.49  8/30- Cr 1.37 and BUN 50  8/31- Cr 1.25 and Bun down to 45- much better  9/1- Cr 1.17  9/6 - Cr up to 1.57 today, hold remeron, follow up BMET tomorrow  9/7- will give NS IVFs since looks so dry- 100cc/hr x 1 day and told him to push PO fluids- 8 cups/day- and placed sign no ice.   9/8- explained to pt will give 1 more day of IVFs 75cc/hr- since still tachycardic and not drinking well.  9. Hyponatremia: Recurrent hyponatremia. Improving to 124 on 8/28.  8/30- Na 127 today  9/6 Na down to 121 today. 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 Per nephrology- will monitor 10. New diagnosis T2DM: Hgb A1c-6.5. BS poorly controlled due to steriods as well as Ensure tid between meals. changed ensure to Ensure Max --continue Lantus 10 units at bedtime with 3 units meal coverage tid as well as SSI for elevated BS.    CBG (last 3)  Recent Labs    03/15/20 1624 03/15/20 2120 03/16/20 0633  GLUCAP 209* 313* 253*    9/1- will increase Lantus to 16 units due to elevated BGs  9/2- will increase lantus again- to 18 units  9/3- will wait til tomorrow to increase Lantus  9/4- BGs keep going UP no matter how much increase Lantus- and pt said not eating a lot- will increase Lantus again.  9/6 increase lantus to 26u as sugars still poorly controlled  9/7- wait until tomorrow to increase  9/8- increase Lantus 219-313 in last 24 hours- will increase to 30 units 11.  Constipation: Continue miralax am/change senna to lunch. KUB done showing ileus?  Wife/patient reports lot of gas and that he has been having BM daily. Pt says as well, BM daily.   8/27- adynamic ileus yesterday- will make sure pt has BM and recheck KUB in AM  8/29: KUB reviewed and shows moderate stool burden- he is having BM now  9/3- still has ileus gas pattern- will give Mg citrate to get him cleaned out this afternoon  9/4- Large BM yesterday and this AM- will order enema just in case  9/5- 2 extra large BMs yesterday- will order Sorbitol because still nauseated AND abd distended- KUB shows moderate stool burden  9/6 repeat sorbitol followed by SSE today if still no bm  9/7- Stomach less distended- no results with enema last night- will monitor  9/8- 2 BMs yesterday- abd less distended- less nausea- con't regimen 12. Mild BPH: will monitor voiding with PVR checks.  13. 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 Pathologic Fx T6 and T9:  Treated with XRT 10/10 and high dose steroids.  15. L foot drop- get prevalon boot on L at night  9/1- told wasn't in room- will order another PRAFO  16. Hyponatremia/Hyperkalemia  8/27- Na 122 this AM- will call Nephrology, esp because K+ 4.8 and was on Lokelma BID- changed to daily and held dose this AM.  8/3-- can stop Lokelma per nephrology and Na up to 127- doing better-   17. Hiccups- for 1 week  8/27- on Thorazine- will add baclofen 10 mg TID prn for hiccups  8/30- will change to QID and schedule it- also asked nursing to give Thorazine when notices hiccups, if due- to try and help.   9/2- said hiccups much better  9/5-6-  hiccups coming back intermittently again- con't regimen 18. Thrombocytopenia  8/30- plts dropping- are 79k- if drop again by Wednesday, will need to see what else is possible other than Lovenox- will check with Heme/Onc.    9/6 Plts down to 54k today---recheck tomorrow  9/7- labs haven't been drawn yet- will hold lovenox  due to low plts  9/8- Plts up to 74k- will restart Lovenox but get labs in AM 18. Dispo  8/30- pt/family want to have discussion Tuesday 1-1:30 pm to discuss medical issues- will try and do this.   9/1- no interpretor there- will do Thursday 19. Dizziness  9/1- - will encourage to push fluids and if doesn't fix, will order abd binder  9/3- vertigo eval by PT showed nothing- likely some orthostatic hypotension that we don't see/catch- will try midodrine early in AM 5 mg and see how he does.   9/4- slightly better  9/5- will increase midodrine to 7.5 mg BID with breakfast and lunch- had an episode of orthostasis with therapy today- this AM-  20. N/V- vomited 3x this AM  9/2- could be chemo- could be meds- will add Zofran prn ; con't compazine; and alternate- ask nursing to give.  9/3 check on Chemo -with PA   9/4- to start Chemo Tuesday- from ileus 21. GERD  9/2- will increase PPI to BID 22. Insomnia  9/2- increase trazodone to 100 mg QHS  9/3- slept better  9/6-  Hold remeron as noted above. Continue trazodone prn 23. Nose bleeds- c/o difficulty breathing  9/5-  Afrin prn and saline nasal spray.   9/7- likely due to low plts  9/8- has scabs in nose that I can see- pt asking me to call ENT since having difficulty "breathing" 24. Throat pain  9/8- Diflucan to try and see has Ritta Slot- will treat as if has thrush   I spent 35 minutes total care today- calling ENT consult- speaking with ENT afterwards, and speaking with pt, OT and interpretor, which takes a lot longer in AM and writing note.      LOS: 13 days A FACE TO FACE EVALUATION WAS PERFORMED  Evia Goldsmith 03/16/2020, 8:48 AM

## 2020-03-16 NOTE — Plan of Care (Signed)
  Problem: Consults Goal: RH SPINAL CORD INJURY PATIENT EDUCATION Description:  See Patient Education module for education specifics.  Outcome: Progressing Goal: Skin Care Protocol Initiated - if Braden Score 18 or less Description: If consults are not indicated, leave blank or document N/A Outcome: Progressing Goal: Diabetes Guidelines if Diabetic/Glucose > 140 Description: If diabetic or lab glucose is > 140 mg/dl - Initiate Diabetes/Hyperglycemia Guidelines & Document Interventions  Outcome: Progressing   Problem: SCI BOWEL ELIMINATION Goal: RH STG MANAGE BOWEL WITH ASSISTANCE Description: STG Manage Bowel with mod Assistance. Outcome: Progressing Goal: RH STG MANAGE BOWEL W/EQUIPMENT W/ASSISTANCE Description: STG Manage Bowel With Equipment With mod Assistance Outcome: Progressing Goal: RH STG SCI MANAGE BOWEL PROGRAM W/ASSIST OR AS APPROPRIATE Description: STG SCI Manage bowel program w/mo assist or as appropriate. Outcome: Progressing   Problem: SCI BLADDER ELIMINATION Goal: RH STG MANAGE BLADDER WITH ASSISTANCE Description: STG Manage Bladder With min Assistance Outcome: Progressing Goal: RH STG MANAGE BLADDER WITH MEDICATION WITH ASSISTANCE Description: STG Manage Bladder With Medication With min Assistance. Outcome: Progressing   Problem: RH SKIN INTEGRITY Goal: RH STG SKIN FREE OF INFECTION/BREAKDOWN Description: Skin will be free of infection/breakdown with min assist Outcome: Progressing Goal: RH STG MAINTAIN SKIN INTEGRITY WITH ASSISTANCE Description: STG Maintain Skin Integrity With min Assistance. Outcome: Progressing   Problem: RH SAFETY Goal: RH STG ADHERE TO SAFETY PRECAUTIONS W/ASSISTANCE/DEVICE Description: STG Adhere to Safety Precautions With min Assistance/Device. Outcome: Progressing   Problem: RH PAIN MANAGEMENT Goal: RH STG PAIN MANAGED AT OR BELOW PT'S PAIN GOAL Description: Pain will be managed at or below 2 out of 10 with min assist Outcome:  Progressing   Problem: RH KNOWLEDGE DEFICIT SCI Goal: RH STG INCREASE KNOWLEDGE OF SELF CARE AFTER SCI Outcome: Progressing

## 2020-03-16 NOTE — Progress Notes (Signed)
Physical Therapy Session Note  Patient Details  Name: Trevor Shelton MRN: [id number] Date of Birth: [birth year]  Today's Date: 03/16/2020 PT Individual Time: 1303-1403 PT Individual Time Calculation (min): 60 min   Short Term Goals: Week 2:  PT Short Term Goal 1 (Week 2): pt will roll independently using momentum strategy PT Short Term Goal 2 (Week 2): Pt will perform transfers w/LRAD w/min assist including uneven surfaces PT Short Term Goal 3 (Week 2): Pt will maintain static stand in stedy w/midline posture maintained x 30sec PT Short Term Goal 4 (Week 2): Pt will ascend/descend ramp w/wc w/min assist PT Short Term Goal 5 (Week 2): pt will perform side to sit on mat w/min assist  Skilled Therapeutic Interventions/Progress Updates: Pt presents supine in bed and c/o fatigue.  Pt reluctantly agrees to therapy.  Pt performed rolling to right w/ supervision and side rails.  Pt required verbal cues for LE flexion to assist.  Pt transfers side-lying to sitting w/ min A for LLE.  Pt able to maintain seated balance at EOB to don shoes w/ Total A.  Pt performed multiple slide board transfers today, bed <> w/c and w/c <> mat table w/ mod A and max A for placement of slide board.  Pt does lean to side forplacement w/ verbal cues, interpreter present during session.  Pt wheeled self to gym w/ independence at least 150' w/o rest breaks.  {Pt educated on placement of hands to improve w/c run.  Pt performed seated reaching for peg board and stacking/unstacking cones w/ BUEs used simultaneously.  Pt w/ 1 LOB but able to regain.  Pt reaching forward and to side outside of BOS.  Pt w/ c/o fatigue and wished to return to bed.  Pt wheeled self back to room and transferred into bed using slide board.  Bed alarm on and all needs in reach.  Family members are present for any needs.     Therapy Documentation Precautions:  Precautions Precautions: Back, Fall (per chart review, no specific back precautions however encouraged  per pt comfort) Precaution Comments: Significant BLE weakness and ataxia-like movements (LLE>RLE); back and rib precautions for comfort due to fxs Restrictions Weight Bearing Restrictions: No General: PT Amount of Missed Time (min): 15 Minutes PT Missed Treatment Reason: Patient fatigue Vital Signs:  Pain: 6/10 B shoulders, R > L.      Therapy/Group: Individual Therapy  [name] 03/16/2020, 3:56 PM

## 2020-03-16 NOTE — Consult Note (Signed)
Reason for Consult: Nose and throat problems Referring Physician: Courtney Heys, MD  Trevor Shelton is an 54 y.o. male.  HPI: Patient with multiple myeloma, currently in rehab for another 2 weeks.  For the past 3 days has been having trouble with nosebleeds and sores in his lips and mouth.  Past Medical History:  Diagnosis Date   AKI (acute kidney injury) ([place]) 02/2020   Hypercalcemia 02/2020   Hypercholesteremia    Hypertension     Past Surgical History:  Procedure Laterality Date   HERNIA REPAIR     IR FLUORO GUIDE CV LINE RIGHT  02/17/2020   IR REMOVAL TUN CV CATH W/O FL  03/01/2020   IR US GUIDE VASC ACCESS RIGHT  02/17/2020    Family History  Problem Relation Age of Onset   Alzheimer's disease Mother    Diabetes Father    Diabetes Brother     Social History:  reports that he has never smoked. He has never used smokeless tobacco. He reports previous alcohol use. He reports previous drug use.  Allergies: No Known Allergies  Medications: Reviewed  Results for orders placed or performed during the hospital encounter of 03/03/20 (from the past 48 hour(s))  Glucose, capillary     Status: Abnormal   Collection Time: 03/14/20  4:37 PM  Result Value Ref Range   Glucose-Capillary 303 (H) 70 - 99 mg/dL    Comment: Glucose reference range applies only to samples taken after fasting for at least 8 hours.   Comment 1 Notify RN   Glucose, capillary     Status: Abnormal   Collection Time: 03/14/20  8:58 PM  Result Value Ref Range   Glucose-Capillary 270 (H) 70 - 99 mg/dL    Comment: Glucose reference range applies only to samples taken after fasting for at least 8 hours.  Glucose, capillary     Status: Abnormal   Collection Time: 03/15/20  6:28 AM  Result Value Ref Range   Glucose-Capillary 265 (H) 70 - 99 mg/dL    Comment: Glucose reference range applies only to samples taken after fasting for at least 8 hours.  CBC     Status: Abnormal   Collection Time: 03/15/20   8:10 AM  Result Value Ref Range   WBC 4.2 4.0 - 10.5 K/uL   RBC 2.75 (L) 4.22 - 5.81 MIL/uL   Hemoglobin 8.1 (L) 13.0 - 17.0 g/dL   HCT 23.2 (L) 39 - 52 %   MCV 84.4 80.0 - 100.0 fL   MCH 29.5 26.0 - 34.0 pg   MCHC 34.9 30.0 - 36.0 g/dL   RDW 12.2 11.5 - 15.5 %   Platelets 76 (L) 150 - 400 K/uL    Comment: REPEATED TO VERIFY Immature Platelet Fraction may be clinically indicated, consider ordering this additional test [id number] CONSISTENT WITH PREVIOUS RESULT    nRBC 1.4 (H) 0.0 - 0.2 %    Comment: Performed at [facility], [place] [address] [place], [place] [address]  Basic metabolic panel     Status: Abnormal   Collection Time: 03/15/20  8:10 AM  Result Value Ref Range   Sodium 122 (L) 135 - 145 mmol/L   Potassium 4.3 3.5 - 5.1 mmol/L   Chloride 94 (L) 98 - 111 mmol/L   CO2 18 (L) 22 - 32 mmol/L   Glucose, Bld 298 (H) 70 - 99 mg/dL    Comment: Glucose reference range applies only to samples taken after fasting for at least 8 hours.  BUN 40 (H) 6 - 20 mg/dL   Creatinine, Ser 1.24 0.61 - 1.24 mg/dL   Calcium 7.9 (L) 8.9 - 10.3 mg/dL   GFR calc non Af Amer >60 >60 mL/min   GFR calc Af Amer >60 >60 mL/min   Anion gap 10 5 - 15    Comment: Performed at [facility] [address] [place], [place] [address]  CBC with Differential/Platelet     Status: Abnormal   Collection Time: 03/15/20  8:10 AM  Result Value Ref Range   WBC 4.2 4.0 - 10.5 K/uL   RBC 2.76 (L) 4.22 - 5.81 MIL/uL   Hemoglobin 8.0 (L) 13.0 - 17.0 g/dL   HCT 23.1 (L) 39 - 52 %   MCV 83.7 80.0 - 100.0 fL   MCH 29.0 26.0 - 34.0 pg   MCHC 34.6 30.0 - 36.0 g/dL   RDW 12.2 11.5 - 15.5 %   Platelets PLATELET CLUMPS NOTED ON SMEAR, UNABLE TO ESTIMATE 150 - 400 K/uL    Comment: Immature Platelet Fraction may be clinically indicated, consider ordering this additional test [id number]    nRBC 1.2 (H) 0.0 - 0.2 %   Neutrophils Relative % 79 %   Neutro Abs 3.4 1.7 - 7.7 K/uL   Lymphocytes Relative 8 %    Lymphs Abs 0.3 (L) 0.7 - 4.0 K/uL   Monocytes Relative 6 %   Monocytes Absolute 0.3 0 - 1 K/uL   Eosinophils Relative 1 %   Eosinophils Absolute 0.0 0 - 0 K/uL   Basophils Relative 0 %   Basophils Absolute 0.0 0 - 0 K/uL   WBC Morphology DOHLE BODIES     Comment: INCREASED BANDS (>20% BANDS) TOXIC GRANULATION VACUOLATED NEUTROPHILS    Immature Granulocytes 6 %   Abs Immature Granulocytes 0.24 (H) 0.00 - 0.07 K/uL    Comment: Performed at [facility], [place] [address] [place], [place] [address]  Glucose, capillary     Status: Abnormal   Collection Time: 03/15/20 11:37 AM  Result Value Ref Range   Glucose-Capillary 219 (H) 70 - 99 mg/dL    Comment: Glucose reference range applies only to samples taken after fasting for at least 8 hours.  Glucose, capillary     Status: Abnormal   Collection Time: 03/15/20  4:24 PM  Result Value Ref Range   Glucose-Capillary 209 (H) 70 - 99 mg/dL    Comment: Glucose reference range applies only to samples taken after fasting for at least 8 hours.  Glucose, capillary     Status: Abnormal   Collection Time: 03/15/20  9:20 PM  Result Value Ref Range   Glucose-Capillary 313 (H) 70 - 99 mg/dL    Comment: Glucose reference range applies only to samples taken after fasting for at least 8 hours.  Glucose, capillary     Status: Abnormal   Collection Time: 03/16/20  6:33 AM  Result Value Ref Range   Glucose-Capillary 253 (H) 70 - 99 mg/dL    Comment: Glucose reference range applies only to samples taken after fasting for at least 8 hours.  Glucose, capillary     Status: Abnormal   Collection Time: 03/16/20 11:19 AM  Result Value Ref Range   Glucose-Capillary 270 (H) 70 - 99 mg/dL    Comment: Glucose reference range applies only to samples taken after fasting for at least 8 hours.    No results found.  XAJ:OINOMVEH except as listed in admit H&P  Blood pressure 119/73, pulse Marland Kitchen)  107, temperature 97.8 F (36.6 C), temperature source Oral, resp.  rate 17, height _0  (1.6 m), weight 57.4 kg, SpO2 99 %.  PHYSICAL EXAM: Overall appearance:  Healthy appearing, in no distress Head:  Normocephalic, atraumatic. Ears: External ears look healthy. Nose: External nose is healthy in appearance. Internal nasal exam reveals bilateral diffuse crusting and scabs.  I cannot tell if there is a septal perforation due to the significant scabbing and dried blood. Oral Cavity/Pharynx:  There are no mucosal lesions or masses identified.  Upper and lower lip sores with dried blood towards the midline. Larynx/Hypopharynx: Deferred Neuro:  No identifiable neurologic deficits. Neck: No palpable neck masses.  Studies Reviewed: none  Procedures: none   Assessment/Plan: Recent nosebleeds, chronic scabbing and dried blood filling the nasal cavities.  Recommend frequent use of nasal saline spray.  Will reevaluate in 1 or 2 weeks to see how he is progressing.  I am not sure of the source of the lip sores but it may be related to his underlying health problems.  Izora Gala 03/16/2020, 12:48 PM

## 2020-03-17 ENCOUNTER — Ambulatory Visit: Payer: Self-pay | Admitting: Internal Medicine

## 2020-03-17 ENCOUNTER — Inpatient Hospital Stay (HOSPITAL_COMMUNITY): Payer: Self-pay | Admitting: Physical Therapy

## 2020-03-17 ENCOUNTER — Inpatient Hospital Stay (HOSPITAL_COMMUNITY): Payer: Self-pay | Admitting: Occupational Therapy

## 2020-03-17 DIAGNOSIS — L8990 Pressure ulcer of unspecified site, unspecified stage: Secondary | ICD-10-CM | POA: Insufficient documentation

## 2020-03-17 LAB — BASIC METABOLIC PANEL
Anion gap: 10 (ref 5–15)
BUN: 30 mg/dL — ABNORMAL HIGH (ref 6–20)
CO2: 15 mmol/L — ABNORMAL LOW (ref 22–32)
Calcium: 8.1 mg/dL — ABNORMAL LOW (ref 8.9–10.3)
Chloride: 102 mmol/L (ref 98–111)
Creatinine, Ser: 1.25 mg/dL — ABNORMAL HIGH (ref 0.61–1.24)
GFR calc Af Amer: >60 mL/min (ref >60)
GFR calc non Af Amer: >60 mL/min (ref >60)
Glucose, Bld: 228 mg/dL — ABNORMAL HIGH (ref 70–99)
Potassium: 4.2 mmol/L (ref 3.5–5.1)
Sodium: 127 mmol/L — ABNORMAL LOW (ref 135–145)

## 2020-03-17 LAB — CBC WITH DIFFERENTIAL/PLATELET
Abs Immature Granulocytes: 0.21 10*3/uL — ABNORMAL HIGH (ref 0.00–0.07)
Basophils Absolute: 0 10*3/uL (ref 0.0–0.1)
Basophils Relative: 1 %
Eosinophils Absolute: 0 10*3/uL (ref 0.0–0.5)
Eosinophils Relative: 0 %
HCT: 22.4 % — ABNORMAL LOW (ref 39.0–52.0)
Hemoglobin: 7.7 g/dL — ABNORMAL LOW (ref 13.0–17.0)
Immature Granulocytes: 6 %
Lymphocytes Relative: 11 %
Lymphs Abs: 0.4 10*3/uL — ABNORMAL LOW (ref 0.7–4.0)
MCH: 29.7 pg (ref 26.0–34.0)
MCHC: 34.4 g/dL (ref 30.0–36.0)
MCV: 86.5 fL (ref 80.0–100.0)
Monocytes Absolute: 0.3 10*3/uL (ref 0.1–1.0)
Monocytes Relative: 7 %
Neutro Abs: 2.6 10*3/uL (ref 1.7–7.7)
Neutrophils Relative %: 75 %
Platelets: 75 10*3/uL — ABNORMAL LOW (ref 150–400)
RBC: 2.59 MIL/uL — ABNORMAL LOW (ref 4.22–5.81)
RDW: 12.9 % (ref 11.5–15.5)
WBC: 3.5 10*3/uL — ABNORMAL LOW (ref 4.0–10.5)
nRBC: 2.6 % — ABNORMAL HIGH (ref 0.0–0.2)

## 2020-03-17 LAB — SODIUM, URINE, RANDOM: Sodium, Ur: 62 mmol/L

## 2020-03-17 LAB — GLUCOSE, CAPILLARY
Glucose-Capillary: 190 mg/dL — ABNORMAL HIGH (ref 70–99)
Glucose-Capillary: 177 mg/dL — ABNORMAL HIGH (ref 70–99)
Glucose-Capillary: 277 mg/dL — ABNORMAL HIGH (ref 70–99)
Glucose-Capillary: 227 mg/dL — ABNORMAL HIGH (ref 70–99)

## 2020-03-17 MED ORDER — TRAMADOL HCL 50 MG PO TABS
50.0000 mg | ORAL_TABLET | Freq: Four times a day (QID) | ORAL | Status: DC
  Administered 2020-03-17 – 2020-03-19 (×9): 50 mg via ORAL
  Filled 2020-03-17 (×10): qty 1

## 2020-03-17 MED ORDER — TRAZODONE HCL 50 MG PO TABS: 100.0000 mg | ORAL_TABLET | Freq: Every evening | ORAL | Status: DC | PRN

## 2020-03-17 MED ORDER — ACYCLOVIR 5 % EX OINT
TOPICAL_OINTMENT | Freq: Every day | CUTANEOUS | Status: DC
  Administered 2020-03-18: 1 via TOPICAL
  Filled 2020-03-17: qty 15

## 2020-03-17 MED ORDER — ACYCLOVIR 5 % EX CREA
TOPICAL_CREAM | Freq: Every day | CUTANEOUS | Status: DC
  Filled 2020-03-17: qty 5

## 2020-03-17 MED ORDER — DEXAMETHASONE 2 MG PO TABS
2.0000 mg | ORAL_TABLET | Freq: Two times a day (BID) | ORAL | Status: DC
  Administered 2020-03-18 – 2020-03-19 (×3): 2 mg via ORAL
  Filled 2020-03-17 (×3): qty 1

## 2020-03-17 MED ORDER — QUETIAPINE FUMARATE 50 MG PO TABS
100.0000 mg | ORAL_TABLET | Freq: Every day | ORAL | Status: DC
  Administered 2020-03-17 – 2020-03-18 (×2): 100 mg via ORAL
  Filled 2020-03-17 (×2): qty 2

## 2020-03-17 NOTE — Progress Notes (Signed)
Urine specimen to lab for random Sodium per order

## 2020-03-17 NOTE — Progress Notes (Signed)
Physical Therapy Session Note  Patient Details  Name: Trevor Shelton MRN: [id number] Date of Birth: 25-Dec-1965  Today's Date: 03/17/2020 PT Individual Time: 1000-1100 and 1300-1413 PT Individual Time Calculation (min): 60 min and 73 mins  Short Term Goals: Week 1:  PT Short Term Goal 1 (Week 1): pt to demonstrate dynamic sitting balance at CGA for 69mns PT Short Term Goal 1 - Progress (Week 1): Met PT Short Term Goal 2 (Week 1): pt to demonstrate min A x1 for rolling consistently PT Short Term Goal 2 - Progress (Week 1): Met PT Short Term Goal 3 (Week 1): pt to demonstrate [facility] PT Short Term Goal 3 - Progress (Week 1): Met PT Short Term Goal 4 (Week 1): pt to self propel WC for 100' CGA with setup for parts PT Short Term Goal 4 - Progress (Week 1): Met Week 2:  PT Short Term Goal 1 (Week 2): pt will roll independently using momentum strategy PT Short Term Goal 1 - Progress (Week 2): Progressing toward goal PT Short Term Goal 2 (Week 2): Pt will perform transfers w/LRAD w/min assist including uneven surfaces PT Short Term Goal 2 - Progress (Week 2): Met PT Short Term Goal 3 (Week 2): Pt will maintain static stand in stedy w/midline posture maintained x 30sec PT Short Term Goal 3 - Progress (Week 2): Progressing toward goal PT Short Term Goal 4 (Week 2): Pt will ascend/descend ramp w/wc w/min assist PT Short Term Goal 4 - Progress (Week 2): Progressing toward goal PT Short Term Goal 5 (Week 2): pt will perform side to sit on mat w/min assist PT Short Term Goal 5 - Progress (Week 2): Met Week 3:  PT Short Term Goal 1 (Week 3): Pt will ascend/descend ramp w/wc w/min assist PT Short Term Goal 2 (Week 3): Pt will maintain static stand in stedy w/midline posture maintained x 30sec PT Short Term Goal 3 (Week 3): pt will roll independently using momentum strategy PT Short Term Goal 4 (Week 3): pt will maintain sitting balance for seated activity for 5 mins without LOB  with BLE supported at CGA PT Short Term Goal 5 (Week 3): pt will be able to verbalize and demonstrate pressure relief with sitting and lying positions and recall schedule I  Skilled Therapeutic Interventions/Progress Updates:    pt received in restroom with NCt and agreeable to therapy, wife present. In person interpreter present. NCT reported she was unable to assist him into standing by herself and requested therapy to assist with use of steady, in place. Pt required max A x2 to achieve standing to steady and dependent for hygiene. Pt total A for transfer with steady into WC. Pt directed in gym at supervision in manual WC. Pt directed in slide board transfer to mat table at min A with VC and sequencing, assist for leaning and board placement and overall slide to table with BLE management intermittently. [facility] outside BOS 2x10 with x3 LOB with reaching laterally outside BOS each time, mod A- max A to correct. Pt directed in lateral scooting with emphasis on clearing bottom from surface to improve skin integrity, pt demonstrated great difficulty with understanding sequencing of this and continued to attempt to achieve scooting with shear forces and pulling self in R or L direction instead of implementing head/hip relationship. Pt directed to stop and given single step cues with visual demonstration which slightly improved, then PT attempted  to break up movement with only forward weight shift to clear bottom in sitting static position pt completed 2x5 of these with full clearing of mat table, then lateral leans 2x5 in R and L direction at min A to achieve. Pt educated on importance of this for skin integrity. Pt directed in slide board transfer to return to [facility], min A, taken to standing frame and directed in static standing 2 mins at CGA with VC and tactile cues for increased glute activation and trunk extension with use of BUE to achieve. Required rest break then  returned to room in [facility] supervision 250' transferred to air mattress bed at mod A 2/2 higher incline and sit>supine mod A for BLE management. Pt's wife present and educated with interpreter of pt's POC, current status and likely need of ramp and WC for home. Wife agreeable and wants to participate in family education reporting she will be here each day moving forward. Pt left in bed, All needs in reach and in good condition. Call light in hand.  Wife present.   Therapy Documentation Precautions:  Precautions Precautions: Back, Fall (per chart review, no specific back precautions however encouraged per pt comfort) Precaution Comments: Significant BLE weakness and ataxia-like movements (LLE>RLE); back and rib precautions for comfort due to fxs Restrictions Weight Bearing Restrictions: (P) No Pain: Pain Assessment Pain Scale: 0-10 Pain Score: 7  Pain Type: Acute pain Pain Location: Shoulder Pain Orientation: Right 3rd Pain Site Pain Intervention(s): Repositioned;Rest    Therapy/Group: Individual Therapy  [name] 03/17/2020, 2:12 PM

## 2020-03-17 NOTE — Progress Notes (Signed)
Occupational Therapy Session Note  Patient Details  Name: Trevor Shelton MRN: [id number] Date of Birth: 24-Feb-1966  Today's Date: 03/17/2020 OT Individual Time: 5797-2820 OT Individual Time Calculation (min): 55 min    Short Term Goals: Week 3:  OT Short Term Goal 1 (Week 3): Pt will be able to push to stand with a RW with max A. OT Short Term Goal 2 (Week 3): Pt will perform squat pivot transfers with mod A OT Short Term Goal 3 (Week 3): Pt will perform toileting tasks with mod A (2/3)  Skilled Therapeutic Interventions/Progress Updates:    Pt resting in bed upon arrival with MD and interpreter present.  OT intervention with focus on bed mobility, SB transfer to w/c, bahting/dressing with sit<>Stand in [place], standing balance, and activity tolerance to increase independence with BADLs. Pt c/o increased overall weakness and commented that he couldn't brush his teeth is as strong as before.  Sit<>stand wit mod A in [place]. Pt dependent for LB self care.  Pt elected to sit in recliner. Transfer with [employer]. Pt remained in recliner with all needs within reach and belt alarm activated.   Therapy Documentation Precautions:  Precautions Precautions: Back, Fall (per chart review, no specific back precautions however encouraged per pt comfort) Precaution Comments: Significant BLE weakness and ataxia-like movements (LLE>RLE); back and rib precautions for comfort due to fxs Restrictions Weight Bearing Restrictions: (P) No   Pain:  Pt c/o ongoing rib discomfort and R shoulder; MD aware, repositined  Therapy/Group: Individual Therapy  Leroy Libman 03/17/2020, 9:00 AM

## 2020-03-17 NOTE — Progress Notes (Signed)
Occupational Therapy Weekly Progress Note  Patient Details  Name: Trevor Shelton MRN: [id number] Date of Birth: 10-23-1965  Beginning of progress report period: March 10, 2020 End of progress report period: March 17, 2020  Patient has met 0 of 3 short term goals.  Pt progress has been minimal and inconsistent during the past week.  Pt experiencing increased fatigue and weakness.  Pt started chemotherapy this past week also. Be mobility with supervision. CGA for static sitting balance EOB with increased trunk weakness noted. UB bathing/dressing with supervision. Pt able to thread pants and don socks/shoes earlier in reporting period but has required increased assistance during later part of period secondary to fatigue and weakness. Pt's discharge date has been extended to 9/22.   Patient continues to demonstrate the following deficits: muscle weakness, muscle paralysis and acute pain, decreased cardiorespiratoy endurance, impaired timing and sequencing, abnormal tone, unbalanced muscle activation, ataxia, decreased coordination and decreased motor planning and decreased sitting balance, decreased standing balance, decreased postural control and decreased balance strategies and therefore will continue to benefit from skilled OT intervention to enhance overall performance with BADL and Reduce care partner burden.  Patient progressing toward long term goals..  Continue plan of care.  OT Short Term Goals Week 2:  OT Short Term Goal 1 (Week 2): Pt will be able to push to stand with a RW with max A. OT Short Term Goal 1 - Progress (Week 2): Progressing toward goal OT Short Term Goal 2 (Week 2): Pt will perform squat pivot transfers with mod A OT Short Term Goal 2 - Progress (Week 2): Progressing toward goal OT Short Term Goal 3 (Week 2): Pt will perform toileting tasks with mod A (2/3) OT Short Term Goal 3 - Progress (Week 2): Progressing toward goal Week 3:  OT Short Term Goal 1 (Week 3): Pt will be  able to push to stand with a RW with max A. OT Short Term Goal 2 (Week 3): Pt will perform squat pivot transfers with mod A OT Short Term Goal 3 (Week 3): Pt will perform toileting tasks with mod A (2/3)    [name] [facility] 03/17/2020, 6:44 AM

## 2020-03-17 NOTE — Progress Notes (Signed)
Resting, verbalize discomfort pain to right shoulder and left rib cage states pain is 7/10 on pain scale  medicated with Oxycodone per order, repositioned and turn to right side, sacral dressing intact, encourage patient to stay off back also to continue po intake, oral care provided, made as comfortable as possible, call bell within reach

## 2020-03-17 NOTE — Plan of Care (Signed)
  Problem: Consults Goal: RH SPINAL CORD INJURY PATIENT EDUCATION Description:  See Patient Education module for education specifics.  Outcome: Progressing Goal: Skin Care Protocol Initiated - if Braden Score 18 or less Description: If consults are not indicated, leave blank or document N/A Outcome: Progressing Goal: Diabetes Guidelines if Diabetic/Glucose > 140 Description: If diabetic or lab glucose is > 140 mg/dl - Initiate Diabetes/Hyperglycemia Guidelines & Document Interventions  Outcome: Progressing   Problem: SCI BOWEL ELIMINATION Goal: RH STG MANAGE BOWEL WITH ASSISTANCE Description: STG Manage Bowel with mod Assistance. Outcome: Progressing Goal: RH STG MANAGE BOWEL W/EQUIPMENT W/ASSISTANCE Description: STG Manage Bowel With Equipment With mod Assistance Outcome: Progressing Goal: RH STG SCI MANAGE BOWEL PROGRAM W/ASSIST OR AS APPROPRIATE Description: STG SCI Manage bowel program w/mo assist or as appropriate. Outcome: Progressing   Problem: SCI BLADDER ELIMINATION Goal: RH STG MANAGE BLADDER WITH ASSISTANCE Description: STG Manage Bladder With min Assistance Outcome: Progressing Goal: RH STG MANAGE BLADDER WITH MEDICATION WITH ASSISTANCE Description: STG Manage Bladder With Medication With min Assistance. Outcome: Progressing   Problem: RH SKIN INTEGRITY Goal: RH STG SKIN FREE OF INFECTION/BREAKDOWN Description: Skin will be free of infection/breakdown with min assist Outcome: Progressing Goal: RH STG MAINTAIN SKIN INTEGRITY WITH ASSISTANCE Description: STG Maintain Skin Integrity With min Assistance. Outcome: Progressing   Problem: RH SAFETY Goal: RH STG ADHERE TO SAFETY PRECAUTIONS W/ASSISTANCE/DEVICE Description: STG Adhere to Safety Precautions With min Assistance/Device. Outcome: Progressing   Problem: RH PAIN MANAGEMENT Goal: RH STG PAIN MANAGED AT OR BELOW PT'S PAIN GOAL Description: Pain will be managed at or below 2 out of 10 with min assist Outcome:  Progressing   Problem: RH KNOWLEDGE DEFICIT SCI Goal: RH STG INCREASE KNOWLEDGE OF SELF CARE AFTER SCI Outcome: Progressing

## 2020-03-17 NOTE — Progress Notes (Signed)
[place] PHYSICAL MEDICINE & REHABILITATION PROGRESS NOTE   Subjective/Complaints:    Pt on IVFs still- says doesn't know what to say this AM, since has so many issues.   Didn't sleep well last night- got Seroquel 50 mg QHS, and didn't sleep well 2 nights ago- same dose of meds.  Says pain not controlled at all- hasn't had oxycodone since yesterday at 8am and no tramadol since 9/1! Not taking prns/pain meds.   Up close vision is blurry this AM-  ENT came to see pt- suggested frequent saline spray usage.   Now has 3 areas of skin breakdown on backside- air mattress is ordered- hasn't gotten yet this AM.   LBM yesterday x1.  Labs not drawn yet- were ordered for this AM.    ROS:  Pt denies SOB, abd pain, CP, N/V/C/D, and vision changes    Objective:   No results found. Recent Labs    03/15/20 0810  WBC 4.2   4.2  HGB 8.0*   8.1*  HCT 23.1*   23.2*  PLT PLATELET CLUMPS NOTED ON SMEAR, UNABLE TO ESTIMATE   76*   Recent Labs    03/15/20 0810  NA 122*  K 4.3  CL 94*  CO2 18*  GLUCOSE 298*  BUN 40*  CREATININE 1.24  CALCIUM 7.9*    Intake/Output Summary (Last 24 hours) at 03/17/2020 0839 Last data filed at 03/17/2020 0608 Gross per 24 hour  Intake 1040 ml  Output 2425 ml  Net -1385 ml     Physical Exam: Vital Signs Blood pressure 122/66, pulse 91, temperature 98.5 F (36.9 C), temperature source Oral, resp. rate 19, height _0  (1.6 m), weight 57.4 kg, SpO2 99 %. Constitutional: sitting up in bed- always on backside; interpretor in room, OT in room, listless still, NAD  HEENT: sores still on lips with black blood notable- maybe slightly better Neck: supple Cardiovascular: RRR  Respiratory/Chest: CTA B/L- no W/R/R- good air movement GI/Abdomen: soft, still less distended; (+)BS hypoactive  Ext: no clubbing, cyanosis, or edema- (+) tenting Psych:listless- feels like he's given up Musculoskeletal: -right shoulder tender with IR/ER, low back TTP    Cervical  back: Neck supple. No rigidity.     Comments: UEs 5/5 in deltoid, biceps, triceps, WE, grip and finger abd B/L- pain with biceps testing due to rib fx's LEs- RLE- HF 3-/5, KE 3+/5, DF 4-/5, PF 5-/5 LLE- HF 2/5, KE 3/5, DF 2/5, PF 5-/5 --motor exam stable Skin:    General: Skin is warm and dry.  Large purple bruise on sacrum- a tiny puncture mark under it- healing well B/L knee brown squishy bruises- improving/healing  2 radiation tags- chest and R side  Neurological: Ox3.   Light touch intact in all 4 extremities B/L    Assessment/Plan: 1. 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  Physiatrist is providing close team supervision and 24 hour management of active medical problems listed below.  Physiatrist and rehab team continue to assess barriers to discharge/monitor patient progress toward functional and medical goals  Care Tool:  Bathing    Body parts bathed by patient: Right arm, Left arm, Chest, Abdomen, Front perineal area, Right upper leg, Left upper leg, Right lower leg, Left lower leg, Face   Body parts bathed by helper: Buttocks     Bathing assist Assist Level: Moderate Assistance -  Patient 50 - 74%     Upper Body Dressing/Undressing Upper body dressing   What is the patient wearing?: Pull over shirt    Upper body assist Assist Level: Set up assist    Lower Body Dressing/Undressing Lower body dressing      What is the patient wearing?: Pants     Lower body assist Assist for lower body dressing: Moderate Assistance - Patient 50 - 74%     Toileting Toileting    Toileting assist Assist for toileting: Maximal Assistance - Patient 25 - 49% Assistive Device Comment: urinal   Transfers Chair/bed transfer  Transfers assist     Chair/bed transfer assist level: Moderate Assistance - Patient 50  - 74%     Locomotion Ambulation   Ambulation assist   Ambulation activity did not occur: Safety/medical concerns          Walk 10 feet activity   Assist  Walk 10 feet activity did not occur: Safety/medical concerns        Walk 50 feet activity   Assist Walk 50 feet with 2 turns activity did not occur: Safety/medical concerns         Walk 150 feet activity   Assist Walk 150 feet activity did not occur: Safety/medical concerns         Walk 10 feet on uneven surface  activity   Assist Walk 10 feet on uneven surfaces activity did not occur: Safety/medical concerns         Wheelchair     Assist Will patient use wheelchair at discharge?: Yes Type of Wheelchair: Manual    Wheelchair assist level: Independent Max wheelchair distance: 150'    Wheelchair 50 feet with 2 turns activity    Assist        Assist Level: Independent   Wheelchair 150 feet activity     Assist      Assist Level: Independent   Blood pressure 122/66, pulse 91, temperature 98.5 F (36.9 C), temperature source Oral, resp. rate 19, height _0  (1.6 m), weight 57.4 kg, SpO2 99 %.   Medical Problem List and Plan: 1.  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  Antithrombotics: -DVT/anticoagulation:  Pharmaceutical: Lovenox   8/27- at high risk for DVT due to SCI and cancer being treated  9/7- will hold Lovenox due to plts of 54k             --BLE dopplers negative for DVT             -antiplatelet therapy: N/a 3. Pain Management: Continue oxycodone --increase to 10 mg q 4 hrs prn--d/c IV dilaudid.  9/1- pt still having pain- encouraged to take meds if need be. Explained why he still having pain.  9/2- will add Lidocaine patch to R shoulder   9/6 pain control seems  reasonable 4. Mood: LCSW to follow for evaluation and support.              -antipsychotic agents: N/a 5. Neuropsych: This patient is capable of making decisions on his own behalf. 6. Skin/Wound Care: Routine pressure relief measures.  7. Fluids/Electrolytes/Nutrition: Strict I/O. Check lytes in am.  8. Acute renal failure: Improving 81/4.92-->62/1.49  8/30-  Cr 1.37 and BUN 50  8/31- Cr 1.25 and Bun down to 45- much better  9/1- Cr 1.17  9/6 - Cr up to 1.57 today, hold remeron, follow up BMET tomorrow  9/7- will give NS IVFs since looks so dry- 100cc/hr x 1 day and told him to push PO fluids- 8 cups/day- and placed sign no ice.   9/8- explained to pt will give 1 more day of IVFs 75cc/hr- since still tachycardic and not drinking well.   9/9- labs this AM- pending- havne't been drawn yet.  9. Hyponatremia: Recurrent hyponatremia. Improving to 124 on 8/28.  8/30- Na 127 today  9/6 Na down to 121 today. 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 Per nephrology- will monitor  9/9- LABS PENDING 10. New diagnosis T2DM: Hgb A1c-6.5. BS poorly controlled due to steriods as well as Ensure tid between meals. changed ensure to Ensure Max --continue Lantus 10 units at bedtime with 3 units meal coverage tid as well as SSI for elevated BS.    CBG (last 3)  Recent Labs    03/16/20 1619 03/16/20 2118 03/17/20 0610  GLUCAP 294* 297* 190*    9/1- will increase Lantus to 16 units due to elevated BGs  9/2- will increase lantus again- to 18 units  9/3- will wait til tomorrow to increase Lantus  9/4- BGs keep going UP no matter how much increase Lantus- and pt said not eating a lot- will increase Lantus again.  9/6 increase lantus to 26u as sugars still poorly controlled  9/7- wait until tomorrow to increase  9/8- increase Lantus 219-313 in last 24 hours- will increase to 30 units  9/9- BG this AM of 190- doing  better 11. Constipation: Continue miralax am/change senna to lunch. KUB done showing ileus?  Wife/patient reports lot of gas and that he has been having BM daily. Pt says as well, BM daily.   8/27- adynamic ileus yesterday- will make sure pt has BM and recheck KUB in AM  8/29: KUB reviewed and shows moderate stool burden- he is having BM now  9/3- still has ileus gas pattern- will give Mg citrate to get him cleaned out this afternoon  9/4- Large BM yesterday and this AM- will order enema just in case  9/5- 2 extra large BMs yesterday- will order Sorbitol because still nauseated AND abd distended- KUB shows moderate stool burden  9/6 repeat sorbitol followed by SSE today if still no bm  9/7- Stomach less distended- no results with enema last night- will monitor  9/8- 2 BMs yesterday- abd less distended- less nausea- con't regimen  9/9- stomach less distended- no nausea- LBM yesterday 12. Mild BPH: will monitor voiding with PVR checks.  13. 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 Pathologic Fx T6 and T9:  Treated with XRT 10/10 and high dose steroids.  15. L foot drop- get prevalon boot on L at night  9/1- told wasn't in room- will order another PRAFO  16. Hyponatremia/Hyperkalemia  8/27- Na 122 this AM- will call Nephrology, esp because K+ 4.8 and was on Lokelma BID- changed to daily and held dose this AM.  8/3-- can stop Lokelma per nephrology and Na up to 127- doing better-   17. Hiccups- for 1 week  8/27- on Thorazine- will add baclofen 10 mg TID prn for hiccups  8/30- will change to QID and schedule  it- also asked nursing to give Thorazine when notices hiccups, if due- to try and help.   9/2- said hiccups much better  9/5-6- hiccups coming back intermittently again- con't regimen 18. Thrombocytopenia  8/30- plts dropping- are 79k- if drop again by Wednesday, will need to see what else is possible other than Lovenox- will check with Heme/Onc.    9/6 Plts down to 54k  today---recheck tomorrow  9/7- labs haven't been drawn yet- will hold lovenox due to low plts  9/8- Plts up to 74k- will restart Lovenox but get labs in AM 18. Dispo  8/30- pt/family want to have discussion Tuesday 1-1:30 pm to discuss medical issues- will try and do this.   9/1- no interpretor there- will do Thursday 19. Dizziness  9/1- - will encourage to push fluids and if doesn't fix, will order abd binder  9/3- vertigo eval by PT showed nothing- likely some orthostatic hypotension that we don't see/catch- will try midodrine early in AM 5 mg and see how he does.   9/4- slightly better  9/5- will increase midodrine to 7.5 mg BID with breakfast and lunch- had an episode of orthostasis with therapy today- this AM-  20. N/V- vomited 3x this AM  9/2- could be chemo- could be meds- will add Zofran prn ; con't compazine; and alternate- ask nursing to give.  9/3 check on Chemo -with PA   9/4- to start Chemo Tuesday- from ileus 21. GERD  9/2- will increase PPI to BID 22. Insomnia  9/2- increase trazodone to 100 mg QHS  9/3- slept better  9/6-  Hold remeron as noted above. Continue trazodone prn  9/9- change to seroquel and increased today to 100 mg and con't trazodone 100 mg QHS prn if cannot sleep 23. Nose bleeds- c/o difficulty breathing  9/5-  Afrin prn and saline nasal spray.   9/7- likely due to low plts  9/8- has scabs in nose that I can see- pt asking me to call ENT since having difficulty "breathing"  9/9- suggested nasal saline spray frequently 24. Throat pain  9/8- Diflucan to try and see has Ritta Slot- will treat as if has thrush 25. Skin breakdown on buttocks  9/9- raw spots x3 on buttocks- ordered air mattress and to turn q2 hours.    LOS: 14 days A FACE TO FACE EVALUATION WAS PERFORMED  Jaeanna Mccomber 03/17/2020, 8:39 AM

## 2020-03-17 NOTE — Progress Notes (Signed)
Patient ID: Odell Choung, male   DOB: April 29, 1966, 54 y.o.   MRN: [id number]  SW met with pt and pt wife in room to discuss a phone call his wife received. Wife is making efforts to file a claim under worker's comp- [employer] with [employer]. Sw spoke with Verdis Frederickson [phone]) who requested documentation or to speak with the attending to discuss if this supports a worker's comp claim. SW requested email on items needed, and provided contact information if needed to discuss further.   Loralee Pacas, MSW, [place] Office: [phone] Cell: [phone] Fax: [phone]

## 2020-03-17 NOTE — Progress Notes (Signed)
Physical Therapy Weekly Progress Note  Patient Details  Name: Trevor Shelton MRN: [id number] Date of Birth: [birth year]  Beginning of progress report period: March 04, 2020 End of progress report period: March 17, 2020  Today's Date: 03/17/2020 PT Individual Time: 1000-1100 PT Individual Time Calculation (min): 60 min   Patient has met 2 of 5 short term goals. Pt continues to require assistance min A -CGA for rolling in bed and with use of bedrails, pt required mod A for ramp descent for safety to control speed and positioning in chair for safety. Pt also required modA - max A for scooting in WC, mod A-min A  for scooting in bed with pt demonstrating difficulty with hip/head relationship. Pt also continues to demonstrate poor coordination in BLE, weakness globally, decreased endurance, decreased trunk stability and control and reports intermittently of dizziness and blurred vision which medical team is aware of and managing. Pt unable to stand without use of equipment of Steady with B knee blocking and support of BUE to pull self forward and only able to maintain for <30s.   Patient continues to demonstrate the following deficits muscle weakness, decreased cardiorespiratoy endurance, impaired timing and sequencing, unbalanced muscle activation, ataxia and decreased coordination and decreased sitting balance, decreased standing balance, decreased postural control and decreased balance strategies and therefore will continue to benefit from skilled PT intervention to increase functional independence with mobility.  Patient progressing toward long term goals..  Continue plan of care.  PT Short Term Goals Week 1:  PT Short Term Goal 1 (Week 1): pt to demonstrate dynamic sitting balance at CGA for 81mns PT Short Term Goal 1 - Progress (Week 1): Met PT Short Term Goal 2 (Week 1): pt to demonstrate min A x1 for rolling consistently PT Short Term Goal 2 - Progress (Week 1): Met PT Short Term Goal 3 (Week  1): pt to demonstrate transfers to[facility] with LRAD PT Short Term Goal 3 - Progress (Week 1): Met PT Short Term Goal 4 (Week 1): pt to self propel WC for 100' CGA with setup for parts PT Short Term Goal 4 - Progress (Week 1): Met Week 2:  PT Short Term Goal 1 (Week 2): pt will roll independently using momentum strategy PT Short Term Goal 2 (Week 2): Pt will perform transfers w/LRAD w/min assist including uneven surfaces PT Short Term Goal 3 (Week 2): Pt will maintain static stand in stedy w/midline posture maintained x 30sec PT Short Term Goal 4 (Week 2): Pt will ascend/descend ramp w/wc w/min assist PT Short Term Goal 5 (Week 2): pt will perform side to sit on mat w/min assist    Skilled Therapeutic Interventions/Progress Updates:    pt received in recliner and agreeable to therapy. PT replaced pt's WC cushion with Roho and assessed to be in good position and at appropriate inflation. Pt directed in [facility]iner [facility] steady, total A and mod A by PT to place and reposition B feet in steady as pt unable to place feet in proper positioning. Pt directed in manual WC mobility for 100' supervision on level surface with VC for break use. Pt directed in kinemax for 10 mins with emphasis being to maintain B knees in midline and proper positioning instead of allowing hips to abduct without control, with VC and visual targets and tactile cues for placement pt able to maintain B LE positioning with minimal loss of control and B feet support with use of kinemax  set to 40%. Pt reported being very fatigued with this and benefited from x3 rest breaks throughout, after backing pt's WC away from kinemax, pt demonstrated poor sitting position and inability to correct BLE for positioning and sitting with BLE extended, hips forward in chair and inability to correct. PT directed pt in correction with BLE placement, pt unable to correct, manual facilitation of BLE required at total A and B feet  blocking, single step cues for trunk leaning to R and L to allow pt to unload hips for posterior positioning and max A to complete. Once pt in good position Pt taken to ramp in [facility] for time management and directed in ascending and descending ramp in manual WC min A to ascend and mod A to descend, pt demonstrated similar positioning with inability to correct and BLE extended passed leg rests after this and required max A to correct again with pt demonstrating poor ability and safety awareness to correct and intially attempted to push self back in chair and unable to correct BLE placement and pushed from extended position, PT directed pt to stop all mobility, follow single step cues to correct, pt able to do this with several attempts and cueing. Pt returned to room, 200' supervision and one rest break in [facility] throughout, directed in steady transfer to return to [place] removed from pt's room. Pt left in recliner, family present, alarm belt set, All needs in reach and in good condition. Call light in hand.  In person interpreter present throughout.   Therapy Documentation Precautions:  Precautions Precautions: Back, Fall (per chart review, no specific back precautions however encouraged per pt comfort) Precaution Comments: Significant BLE weakness and ataxia-like movements (LLE>RLE); back and rib precautions for comfort due to fxs Restrictions Weight Bearing Restrictions: (P) No    Pain: Pain Assessment Pain Scale: 0-10 Pain Score: 7  Pain Type: Acute pain Pain Location: Shoulder Pain Orientation: Right 3rd Pain Site Pain Intervention(s): Repositioned;Rest   Therapy/Group: Individual Therapy  Trevor Shelton 03/17/2020, 10:27 AM

## 2020-03-18 ENCOUNTER — Other Ambulatory Visit: Payer: Self-pay | Admitting: Pharmacist

## 2020-03-18 ENCOUNTER — Inpatient Hospital Stay (HOSPITAL_COMMUNITY): Payer: Self-pay

## 2020-03-18 ENCOUNTER — Inpatient Hospital Stay (HOSPITAL_COMMUNITY): Payer: Self-pay | Admitting: Physical Therapy

## 2020-03-18 LAB — GLUCOSE, CAPILLARY
Glucose-Capillary: 201 mg/dL — ABNORMAL HIGH (ref 70–99)
Glucose-Capillary: 171 mg/dL — ABNORMAL HIGH (ref 70–99)
Glucose-Capillary: 172 mg/dL — ABNORMAL HIGH (ref 70–99)
Glucose-Capillary: 239 mg/dL — ABNORMAL HIGH (ref 70–99)
Glucose-Capillary: 180 mg/dL — ABNORMAL HIGH (ref 70–99)

## 2020-03-18 LAB — CBC WITH DIFFERENTIAL/PLATELET
Abs Immature Granulocytes: 0.07 10*3/uL (ref 0.00–0.07)
Basophils Absolute: 0 10*3/uL (ref 0.0–0.1)
Basophils Relative: 0 %
Eosinophils Absolute: 0 10*3/uL (ref 0.0–0.5)
Eosinophils Relative: 1 %
HCT: 19.7 % — ABNORMAL LOW (ref 39.0–52.0)
Hemoglobin: 6.8 g/dL — CL (ref 13.0–17.0)
Immature Granulocytes: 3 %
Lymphocytes Relative: 11 %
Lymphs Abs: 0.3 10*3/uL — ABNORMAL LOW (ref 0.7–4.0)
MCH: 28.8 pg (ref 26.0–34.0)
MCHC: 34.5 g/dL (ref 30.0–36.0)
MCV: 83.5 fL (ref 80.0–100.0)
Monocytes Absolute: 0.2 10*3/uL (ref 0.1–1.0)
Monocytes Relative: 8 %
Neutro Abs: 2.1 10*3/uL (ref 1.7–7.7)
Neutrophils Relative %: 77 %
Platelets: 49 10*3/uL — ABNORMAL LOW (ref 150–400)
RBC: 2.36 MIL/uL — ABNORMAL LOW (ref 4.22–5.81)
RDW: 12.7 % (ref 11.5–15.5)
WBC: 2.7 10*3/uL — ABNORMAL LOW (ref 4.0–10.5)
nRBC: 3 % — ABNORMAL HIGH (ref 0.0–0.2)

## 2020-03-18 LAB — BASIC METABOLIC PANEL
Anion gap: 11 (ref 5–15)
BUN: 33 mg/dL — ABNORMAL HIGH (ref 6–20)
CO2: 19 mmol/L — ABNORMAL LOW (ref 22–32)
Calcium: 8.3 mg/dL — ABNORMAL LOW (ref 8.9–10.3)
Chloride: 93 mmol/L — ABNORMAL LOW (ref 98–111)
Creatinine, Ser: 0.91 mg/dL (ref 0.61–1.24)
GFR calc Af Amer: >60 mL/min (ref >60)
GFR calc non Af Amer: >60 mL/min (ref >60)
Glucose, Bld: 159 mg/dL — ABNORMAL HIGH (ref 70–99)
Potassium: 4.1 mmol/L (ref 3.5–5.1)
Sodium: 123 mmol/L — ABNORMAL LOW (ref 135–145)

## 2020-03-18 LAB — PREPARE RBC (CROSSMATCH)

## 2020-03-18 LAB — ABO/RH: ABO/RH(D): O POS

## 2020-03-18 MED ORDER — SODIUM CHLORIDE 0.9 % IV SOLN: INTRAVENOUS | Status: DC

## 2020-03-18 MED ORDER — FUROSEMIDE 10 MG/ML IJ SOLN
20.0000 mg | Freq: Once | INTRAMUSCULAR | Status: AC
  Administered 2020-03-18: 20 mg via INTRAVENOUS
  Filled 2020-03-18: qty 2

## 2020-03-18 MED ORDER — SODIUM CHLORIDE 0.9% IV SOLUTION: Freq: Once | INTRAVENOUS | Status: AC

## 2020-03-18 MED ORDER — DIPHENHYDRAMINE HCL 25 MG PO CAPS
25.0000 mg | ORAL_CAPSULE | Freq: Once | ORAL | Status: AC
  Administered 2020-03-18: 25 mg via ORAL
  Filled 2020-03-18: qty 1

## 2020-03-18 MED ORDER — OXYMETAZOLINE HCL 0.05 % NA SOLN
2.0000 | Freq: Three times a day (TID) | NASAL | Status: DC
  Administered 2020-03-18 – 2020-03-19 (×6): 2 via NASAL
  Filled 2020-03-18: qty 30

## 2020-03-18 MED ORDER — ACETAMINOPHEN 325 MG PO TABS
650.0000 mg | ORAL_TABLET | Freq: Once | ORAL | Status: AC
  Administered 2020-03-18: 650 mg via ORAL
  Filled 2020-03-18: qty 2

## 2020-03-18 NOTE — Progress Notes (Signed)
Notified by assigned NT that patient nose bleed has resumed, upon assessing patient noted nasal gauze pacing was saturated with bright red blood, HOB elevated ,new gauze reapplied to right nasal area and ice pack, monitor. Patient is aware of this occurrence and emotional support and monitoring continue.

## 2020-03-18 NOTE — Progress Notes (Signed)
Patient in semi-fowlers position and more alert and responding to questions appropriate, can nott recall incident with nose bleed, explain occurrence. Resting taking some po liquids, monitor and oral care provided. VS monitored, repositioned in bed with staff assist x2, No nose bleeding at this time.

## 2020-03-18 NOTE — Progress Notes (Signed)
[facility] PHYSICAL MEDICINE & REHABILITATION PROGRESS NOTE   Subjective/Complaints:    Had severe nosebleed last night- lasted 2 hours- lots of clots- said he was very lethargic up until 3am-  MEWS up up- HR up to 110s Says has sores inside mouth Said can breathe better now.     ROS:   Pt denies SOB, abd pain, CP, N/V/C/D, and vision changes    Objective:   No results found. Recent Labs    03/17/20 1000  WBC 3.5*  HGB 7.7*  HCT 22.4*  PLT 75*   Recent Labs    03/17/20 1000  NA 127*  K 4.2  CL 102  CO2 15*  GLUCOSE 228*  BUN 30*  CREATININE 1.25*  CALCIUM 8.1*    Intake/Output Summary (Last 24 hours) at 03/18/2020 0849 Last data filed at 03/18/2020 0600 Gross per 24 hour  Intake 660 ml  Output 725 ml  Net -65 ml     Physical Exam: Vital Signs Blood pressure (!) 143/94, pulse (!) 119, temperature 98.5 F (36.9 C), resp. rate 16, height _0  (1.6 m), weight 57.4 kg, SpO2 100 %. Constitutional: laying back in bed; very pale; VERY listless, answering questions, but not spontaneously speaking, interpretor in room, RN in room, put back in IVFs 75cc/hr due to bleeding event HEENT: conjunctiva completely pale-  Neck: supple Cardiovascular: tachycardic- regular rhythm Respiratory/Chest: CTA B/L- no W/R/R- good air movement GI/Abdomen: Soft, NT, ND, (+)BS - less distended- almost flat abdomen  Ext: no clubbing, cyanosis, or edema- (+) tenting Psych: VERY listless, likely due to bleeding event Musculoskeletal: -right shoulder tender with IR/ER, low back TTP    Cervical back: Neck supple. No rigidity.     Comments: UEs 5/5 in deltoid, biceps, triceps, WE, grip and finger abd B/L- pain with biceps testing due to rib fx's LEs- RLE- HF 3-/5, KE 3+/5, DF 4-/5, PF 5-/5 LLE- HF 2/5, KE 3/5, DF 2/5, PF 5-/5 --motor exam stable Skin:    General: Skin is warm and dry.  Large purple bruise on sacrum- a tiny puncture mark under it- healing well B/L knee brown squishy  bruises- improving/healing  2 radiation tags- chest and R side  Neurological: not confused, but no spontaneous speech   Light touch intact in all 4 extremities B/L    Assessment/Plan: 1. 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  Physiatrist is providing close team supervision and 24 hour management of active medical problems listed below.  Physiatrist and rehab team continue to assess barriers to discharge/monitor patient progress toward functional and medical goals  Care Tool:  Bathing    Body parts bathed by patient: Right arm, Left arm, Chest, Abdomen, Front perineal area, Right upper leg, Left upper leg, Face   Body parts bathed by helper: Buttocks, Right lower leg, Left lower leg     Bathing assist Assist Level: Moderate Assistance - Patient 50 - 74%     Upper Body Dressing/Undressing Upper body dressing   What is the patient wearing?: Pull over shirt    Upper body assist Assist Level: Set up assist    Lower Body Dressing/Undressing Lower body dressing      What is the patient wearing?: Pants     Lower body assist Assist for lower body dressing: Maximal Assistance - Patient 25 - 49%     Toileting Toileting  Toileting assist Assist for toileting: Maximal Assistance - Patient 25 - 49% Assistive Device Comment: urinal   Transfers Chair/bed transfer  Transfers assist     Chair/bed transfer assist level: Moderate Assistance - Patient 50 - 74%     Locomotion Ambulation   Ambulation assist   Ambulation activity did not occur: Safety/medical concerns          Walk 10 feet activity   Assist  Walk 10 feet activity did not occur: Safety/medical concerns        Walk 50 feet activity   Assist Walk 50 feet with 2 turns activity did not occur: Safety/medical concerns          Walk 150 feet activity   Assist Walk 150 feet activity did not occur: Safety/medical concerns         Walk 10 feet on uneven surface  activity   Assist Walk 10 feet on uneven surfaces activity did not occur: Safety/medical concerns         Wheelchair     Assist Will patient use wheelchair at discharge?: Yes Type of Wheelchair: Manual    Wheelchair assist level: Independent Max wheelchair distance: 150'    Wheelchair 50 feet with 2 turns activity    Assist        Assist Level: Independent   Wheelchair 150 feet activity     Assist      Assist Level: Independent   Blood pressure (!) 143/94, pulse (!) 119, temperature 98.5 F (36.9 C), resp. rate 16, height _0  (1.6 m), weight 57.4 kg, SpO2 100 %.   Medical Problem List and Plan: 1.  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  Antithrombotics: -DVT/anticoagulation:  Pharmaceutical: Lovenox   8/27- at high risk for DVT due to SCI and cancer being treated  9/7- will hold Lovenox due to plts of 54k             --BLE dopplers negative for DVT             -antiplatelet therapy: N/a 3. Pain Management: Continue oxycodone --increase to 10 mg q 4 hrs prn--d/c IV dilaudid.  9/1- pt still having pain- encouraged to take meds if need be. Explained why he still having pain.  9/2- will add Lidocaine patch to R shoulder   9/6 pain control seems reasonable 4. Mood: LCSW to follow for evaluation and support.              -antipsychotic agents: N/a 5. Neuropsych: This patient is capable of making decisions on his own behalf. 6. Skin/Wound Care: Routine pressure relief measures.  7. Fluids/Electrolytes/Nutrition: Strict I/O. Check lytes in am.  8. Acute renal failure: Improving 81/4.92-->62/1.49  8/30- Cr 1.37 and BUN  50  8/31- Cr 1.25 and Bun down to 45- much better  9/1- Cr 1.17  9/6 - Cr up to 1.57 today, hold remeron, follow up BMET tomorrow  9/7- will give NS IVFs since looks so dry- 100cc/hr x 1 day and told him to push PO fluids- 8 cups/day- and placed sign no ice.   9/8- explained to pt will give 1 more day of IVFs 75cc/hr- since still tachycardic and not drinking well.  9/9- labs this AM- pending- havne't been drawn yet.  9. Hyponatremia: Recurrent hyponatremia. Improving to 124 on 8/28.  8/30- Na 127 today  9/6 Na down to 121 today. 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 Per nephrology- will monitor  9/9- LABS PENDING  9/10- Na up to 127 10. New diagnosis T2DM: Hgb A1c-6.5. BS poorly controlled due to steriods as well as Ensure tid between meals. changed ensure to Ensure Max --continue Lantus 10 units at bedtime with 3 units meal coverage tid as well as SSI for elevated BS.    CBG (last 3)  Recent Labs    03/17/20 2105 03/18/20 0118 03/18/20 0616  GLUCAP 227* 201* 171*    9/1- will increase Lantus to 16 units due to elevated BGs  9/2- will increase lantus again- to 18 units  9/3- will wait til tomorrow to increase Lantus  9/4- BGs keep going UP no matter how much increase Lantus- and pt said not eating a lot- will increase Lantus again.  9/6 increase lantus to 26u as sugars still poorly controlled  9/7- wait until tomorrow to increase  9/8- increase Lantus 219-313 in last 24 hours- will increase to 30 units  9/9- BG this AM of 190- doing better  9/10- BGs low 200s- better 11. Constipation: Continue miralax am/change senna to lunch. KUB done showing ileus?  Wife/patient reports lot of gas and that he has been having BM daily. Pt says as well, BM daily.   8/27- adynamic ileus yesterday- will make sure pt has BM and recheck KUB in AM  8/29: KUB reviewed and shows moderate stool burden- he is having  BM now  9/3- still has ileus gas pattern- will give Mg citrate to get him cleaned out this afternoon  9/4- Large BM yesterday and this AM- will order enema just in case  9/5- 2 extra large BMs yesterday- will order Sorbitol because still nauseated AND abd distended- KUB shows moderate stool burden  9/6 repeat sorbitol followed by SSE today if still no bm  9/7- Stomach less distended- no results with enema last night- will monitor  9/8- 2 BMs yesterday- abd less distended- less nausea- con't regimen  9/9- stomach less distended- no nausea- LBM yesterday 12. Mild BPH: will monitor voiding with PVR checks.  13. 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 Pathologic Fx T6 and T9:  Treated with XRT 10/10 and high dose steroids.  15. L foot drop- get prevalon boot on L at night  9/1- told wasn't in room- will order another PRAFO  16. Hyponatremia/Hyperkalemia  8/27- Na 122 this AM- will call Nephrology, esp because K+ 4.8 and was on Lokelma BID- changed to daily and held dose this AM.  8/3-- can stop Lokelma per nephrology and Na up to 127- doing better-   17. Hiccups- for 1 week  8/27- on Thorazine- will add baclofen 10 mg TID prn for hiccups  8/30- will change to QID and schedule it- also asked nursing to give Thorazine when notices hiccups, if due- to try and help.   9/2- said hiccups much better  9/5-6- hiccups coming back intermittently again- con't regimen 18. Thrombocytopenia  8/30- plts dropping- are 79k- if drop again by Wednesday, will need to see what else is possible other than Lovenox- will check with Heme/Onc.    9/6 Plts down to 54k today---recheck tomorrow  9/7- labs  haven't been drawn yet- will hold lovenox due to low plts  9/8- Plts up to 74k- will restart Lovenox but get labs in AM 18. Dispo  8/30- pt/family want to have discussion Tuesday 1-1:30 pm to discuss medical issues- will try and do this.   9/1- no interpretor there- will do Thursday 19. Dizziness  9/1- -  will encourage to push fluids and if doesn't fix, will order abd binder  9/3- vertigo eval by PT showed nothing- likely some orthostatic hypotension that we don't see/catch- will try midodrine early in AM 5 mg and see how he does.   9/4- slightly better  9/5- will increase midodrine to 7.5 mg BID with breakfast and lunch- had an episode of orthostasis with therapy today- this AM-  20. N/V- vomited 3x this AM  9/2- could be chemo- could be meds- will add Zofran prn ; con't compazine; and alternate- ask nursing to give.  9/3 check on Chemo -with PA   9/4- to start Chemo Tuesday- from ileus 21. GERD  9/2- will increase PPI to BID 22. Insomnia  9/2- increase trazodone to 100 mg QHS  9/3- slept better  9/6-  Hold remeron as noted above. Continue trazodone prn  9/9- change to seroquel and increased today to 100 mg and con't trazodone 100 mg QHS prn if cannot sleep 23. Nose bleeds- c/o difficulty breathing  9/5-  Afrin prn and saline nasal spray.   9/7- likely due to low plts  9/8- has scabs in nose that I can see- pt asking me to call ENT since having difficulty "breathing"  9/9- suggested nasal saline spray frequently  9/10- had 2 HOUR nosebleed last night- looks very pale, like needs Blood transfusion this AM- have ordered STAT labs- waiting to get them- also ordered type and screen 24. Throat pain  9/8- Diflucan to try and see has Ritta Slot- will treat as if has thrush 25. Skin breakdown on buttocks  9/9- raw spots x3 on buttocks- ordered air mattress and to turn q2 hours.   Called wife from pt's room to use interpretor to tell wife what occurred last night- nose bleed for 2 hours- a lot of clots- stopped Lovenox, wrote for labs STAT, will likely need blood transfusion; started Afrin TID to prevent additional nose bleeds; also started IVFs 75 cc/hr- told nursing have a low threshold for calling rapid response, if MEWS changes. No therapy today.   I spent a total of 45 minutes on total care  today, calling wife, speaking with 3 different nurses, and seeing pt/note.    LOS: 15 days A FACE TO FACE EVALUATION WAS PERFORMED  Maxx Pham 03/18/2020, 8:49 AM

## 2020-03-18 NOTE — Progress Notes (Signed)
Occupational Therapy Note  Patient Details  Name: Trevor Shelton MRN: [id number] Date of Birth: 10/03/1965  Today's Date: 03/18/2020 OT Missed Time: 76 Minutes Missed Time Reason: MD hold (comment)  Pt missed 60 mins skilled OT services.  MD hold 2/2 medical.    Leroy Libman 03/18/2020, 10:06 AM

## 2020-03-18 NOTE — Progress Notes (Signed)
Physical Therapy Session Note  Patient Details  Name: Trevor Shelton MRN: [id number] Date of Birth: December 12, 1965  Today's Date: 03/18/2020 PT missed time: 70mins Reason: MD hold    Pt missed 75 mins skilled PT services 2/2 MD placing pt on medical hold at this time.       [name] 03/18/2020, 1:42 PM

## 2020-03-18 NOTE — Progress Notes (Signed)
Upon entering this patient room wrier notice noted large amount of bleeding from right nostril area of patient and changed in LOC,( lethargic,weak,and slow to respond.HOB elevated to high fowler positioned, and repositioned on side, respiration unlabored and VS obtain. Bedside interpretor notified due to Spanish language, CN nurseJeanie Cooks RN) informed and at bedside.Large clot noted from nasal area after applying a pressure gauze dressing and small ice pack,.Patient does respond at interval with a whispering soft voice appropriately when questions were asked from bedside interpretor. Patient did squeeze writer hand when asked briefly and noted weakness, CBG obtained with result of 201.Nosebleed continue, notified Rapid responds for support and assessment of, patient due to LOC  changes. On call Sylvester Harder, PA also notfied with updated changes order received to obtain CBC and hold Lovenox for now. VS monitor, incontinent of large volume of urine,incontinent care provided. Closely monitor

## 2020-03-18 NOTE — Progress Notes (Signed)
CBC with drop in all cell lines--platelets relatively stable and drop in H/H likely multifactorial due to chemo as well as nose bleed.  Await diff to monitor ANC- will recheck CBC in am to monitor WBC/H/H

## 2020-03-18 NOTE — Progress Notes (Signed)
Notified Dr Dagoberto Ligas and updated on incident with patient,new orders received I and VF's resumed NS @ 75cc/hr, spouse was notified by Dr Dagoberto Ligas,

## 2020-03-18 NOTE — Progress Notes (Signed)
[place] Telephone:([phone]) [phone]   [phone]  PROGRESS NOTE  Patient Care Team: Patient, No Pcp Per as PCP - General (General Practice)  Hematological/Oncological History # Lytic Lesions Throughout Skeleton/Hypercalcemia # Pathologic Fractures of Spine/Soft Tissue Masses in Spine #Concern for Multiple Myeloma 1) 02/14/2020: presented to the emergency department with markedly worsening back pain. 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 The T6 lesion severely narrows the thecal sac and deforms the spinal cord. 3) 02/18/2020: establish care with Dr. Lorenso Courier    HISTORY OF PRESENTING ILLNESS:  Trevor Shelton 54 y.o. male with medical history significant for HTN and HLD who presented with back pain and was found to have cord compression and lytic lesions concerning for multiple myeloma.   On review of the previous records progress he initially presented to the emergency department on 02/14/2020 with markedly worsening back pain. The pain has been going on for approximately 1 month's time when he initially presented. In the emergency department had a CT chest abdomen pelvis which showed lytic lesions throughout the skeleton consistent with multiple myeloma or metastatic disease. There were also signs of compression fracture of the spine. An MRI spine was ordered which showed epidural tumor extension at T6 and T8-9. The T6 lesion severely narrowed the thecal sac and deforms the spinal cord. The patient was referred to radiation oncology. In addition to radiation oncology, medical oncology was consulted for further evaluation and management as well.  The patient was seen today with the assistance of an interpreter.  His wife is at the bedside.  Events overnight noted.  The patient developed a significant nosebleed which occurred on and off for  about 3 hours.  He has multiple dried blisters in his nares.  Nursing also reported that he was more somnolent surrounding the time of his nosebleed.  He is more awake and alert at the time my visit today.  He has not had any recurrent bleeding today.  He had a CBC performed this morning which showed a WBC of 2.7, hemoglobin 6.8 and platelet count of 49,000.  He is receiving 2 units PRBCs.  Lovenox has been stopped.  His only complaint today is of right shoulder pain.  He rates pain 7/10. The remainder of a 10 point review of systems is noncontributory.  MEDICAL HISTORY:  Past Medical History:  Diagnosis Date   AKI (acute kidney injury) ([place]) 02/2020   Hypercalcemia 02/2020   Hypercholesteremia    Hypertension     SURGICAL HISTORY: Past Surgical History:  Procedure Laterality Date   HERNIA REPAIR     IR FLUORO GUIDE CV LINE RIGHT  02/17/2020   IR REMOVAL TUN CV CATH W/O FL  03/01/2020   IR US GUIDE VASC ACCESS RIGHT  02/17/2020    SOCIAL HISTORY: Social History   Socioeconomic History   Marital status: Married    Spouse name: Not on file   Number of children: Not on file   Years of education: Not on file   Highest education level: Not on file  Occupational History   Not on file  Tobacco Use   Smoking status: Never Smoker   Smokeless tobacco: Never Used  Vaping Use   Vaping Use: Never used  Substance and Sexual Activity   Alcohol use: Not Currently   Drug use: Not Currently   Sexual activity: Not Currently  Other Topics Concern  Not on file  Social History Narrative   Not on file   Social Determinants of Health   Financial Resource Strain:    Difficulty of Paying Living Expenses: Not on file  Food Insecurity:    Worried About Reserve in the Last Year: Not on file   Ran Out of Food in the Last Year: Not on file  Transportation Needs:    Lack of Transportation (Medical): Not on file   Lack of Transportation (Non-Medical): Not on  file  Physical Activity:    Days of Exercise per Week: Not on file   Minutes of Exercise per Session: Not on file  Stress:    Feeling of Stress : Not on file  Social Connections:    Frequency of Communication with Friends and Family: Not on file   Frequency of Social Gatherings with Friends and Family: Not on file   Attends Religious Services: Not on file   Active Member of Clubs or Organizations: Not on file   Attends Archivist Meetings: Not on file   Marital Status: Not on file  Intimate Partner Violence:    Fear of Current or Ex-Partner: Not on file   Emotionally Abused: Not on file   Physically Abused: Not on file   Sexually Abused: Not on file    FAMILY HISTORY: Family History  Problem Relation Age of Onset   Alzheimer's disease Mother    Diabetes Father    Diabetes Brother     ALLERGIES:  has No Known Allergies.  MEDICATIONS:  Current Facility-Administered Medications  Medication Dose Route Frequency Provider Last Rate Last Admin   (feeding supplement) PROSource Plus liquid 30 mL  30 mL Oral BID BM Love, Pamela S, PA-C   30 mL at 03/18/20 1352   0.9 %  sodium chloride infusion   Intravenous Continuous Love, Ivan Anchors, PA-C   Stopped at 03/18/20 1331   acetaminophen (TYLENOL) tablet 325-650 mg  325-650 mg Oral Q4H PRN Bary Leriche, PA-C   650 mg at 03/14/20 2050   acyclovir (ZOVIRAX) 200 MG capsule 400 mg  400 mg Oral Daily Mikey Bussing R, NP   400 mg at 03/18/20 0847   acyclovir ointment (ZOVIRAX) 5 %   Topical 6 X Daily Lovorn, Megan, MD   Given at 03/18/20 1321   alum & mag hydroxide-simeth (MAALOX/MYLANTA) 200-200-20 MG/5ML suspension 30 mL  30 mL Oral Q4H PRN Bary Leriche, PA-C   30 mL at 03/12/20 2055   amLODipine (NORVASC) tablet 10 mg  10 mg Oral Daily Bary Leriche, PA-C   10 mg at 03/18/20 0846   atorvastatin (LIPITOR) tablet 20 mg  20 mg Oral Daily Bary Leriche, PA-C   20 mg at 03/18/20 0846   baclofen (LIORESAL)  tablet 10 mg  10 mg Oral QID Lovorn, Jinny Blossom, MD   10 mg at 03/18/20 1318   bisacodyl (DULCOLAX) suppository 10 mg  10 mg Rectal Daily PRN Love, Pamela S, PA-C       carvedilol (COREG) tablet 3.125 mg  3.125 mg Oral BID WC Love, Pamela S, PA-C   3.125 mg at 03/18/20 0846   chlorproMAZINE (THORAZINE) tablet 25 mg  25 mg Oral QID PRN Lovorn, Jinny Blossom, MD   25 mg at 03/15/20 1636   clonazePAM (KLONOPIN) disintegrating tablet 0.25 mg  0.25 mg Oral TID PRN Bary Leriche, PA-C   0.25 mg at 03/10/20 0142   dexamethasone (DECADRON) tablet 2 mg  2 mg  Oral Q12H Maryanna Shape, NP   2 mg at 03/18/20 0846   diphenhydrAMINE (BENADRYL) 12.5 MG/5ML elixir 12.5-25 mg  12.5-25 mg Oral Q6H PRN Love, Pamela S, PA-C       fluconazole (DIFLUCAN) tablet 100 mg  100 mg Oral Daily Lovorn, Megan, MD   100 mg at 03/18/20 0846   furosemide (LASIX) injection 20 mg  20 mg Intravenous Once Love, Pamela S, PA-C       guaiFENesin-dextromethorphan (ROBITUSSIN DM) 100-10 MG/5ML syrup 5-10 mL  5-10 mL Oral Q6H PRN Love, Pamela S, PA-C       insulin aspart (novoLOG) injection 0-9 Units  0-9 Units Subcutaneous TID WC LoveIvan Anchors, PA-C   2 Units at 03/18/20 1324   insulin aspart (novoLOG) injection 3 Units  3 Units Subcutaneous TID WC LoveIvan Anchors, PA-C   3 Units at 03/18/20 1323   insulin glargine (LANTUS) injection 30 Units  30 Units Subcutaneous QHS Lovorn, Megan, MD   30 Units at 03/17/20 2211   lidocaine (LIDODERM) 5 % 2 patch  2 patch Transdermal Q24H Lovorn, Jinny Blossom, MD   2 patch at 03/18/20 0849   lidocaine (XYLOCAINE) 2 % jelly   Topical PRN Love, Pamela S, PA-C       lidocaine (XYLOCAINE) 2 % viscous mouth solution 15 mL  15 mL Mouth/Throat Q6H PRN Love, Pamela S, PA-C       magic mouthwash w/lidocaine  5 mL Oral QID PRN Mikey Bussing R, NP   5 mL at 03/16/20 0758   midodrine (PROAMATINE) tablet 7.5 mg  7.5 mg Oral BID WC Lovorn, Megan, MD   7.5 mg at 03/18/20 1317   ondansetron (ZOFRAN) tablet 4 mg  4  mg Oral Q6H PRN Lovorn, Megan, MD   4 mg at 03/15/20 0556   oxyCODONE (Oxy IR/ROXICODONE) immediate release tablet 5-10 mg  5-10 mg Oral Q4H PRN Love, Pamela S, PA-C   10 mg at 03/17/20 2109   oxymetazoline (AFRIN) 0.05 % nasal spray 2 spray  2 spray Each Nare TID with meals Love, Pamela S, PA-C   2 spray at 03/18/20 1325   pantoprazole (PROTONIX) EC tablet 40 mg  40 mg Oral BID AC Lovorn, Megan, MD   40 mg at 03/18/20 0631   phenol (CHLORASEPTIC) mouth spray 1 spray  1 spray Mouth/Throat PRN Bary Leriche, PA-C   1 spray at 03/15/20 2300   polyethylene glycol (MIRALAX / GLYCOLAX) packet 17 g  17 g Oral Daily PRN Love, Pamela S, PA-C       polyethylene glycol (MIRALAX / GLYCOLAX) packet 17 g  17 g Oral Daily Bary Leriche, PA-C   17 g at 03/18/20 0846   prochlorperazine (COMPAZINE) tablet 5-10 mg  5-10 mg Oral Q6H PRN Bary Leriche, PA-C   10 mg at 03/16/20 4268   Or   prochlorperazine (COMPAZINE) injection 5-10 mg  5-10 mg Intramuscular Q6H PRN Bary Leriche, PA-C   10 mg at 03/10/20 0142   Or   prochlorperazine (COMPAZINE) suppository 12.5 mg  12.5 mg Rectal Q6H PRN Love, Pamela S, PA-C       protein supplement (ENSURE MAX) liquid  11 oz Oral BID BM Love, Pamela S, PA-C   11 oz at 03/18/20 1332   QUEtiapine (SEROQUEL) tablet 100 mg  100 mg Oral QHS Lovorn, Megan, MD   100 mg at 03/17/20 2045   senna-docusate (Senokot-S) tablet 2 tablet  2 tablet Oral BID Lovorn, [place],  MD   2 tablet at 03/18/20 0846   simethicone (MYLICON) chewable tablet 80 mg  80 mg Oral QID PRN Bary Leriche, PA-C   80 mg at 03/12/20 2706   sodium bicarbonate/sodium chloride mouthwash 1038m   Mouth Rinse PRN CMaryanna Shape NP       sodium chloride (OCEAN) 0.65 % nasal spray 2 spray  2 spray Each Nare Q1H while awake RIzora Gala MD   2 spray at 03/18/20 1352   sorbitol 70 % solution 30 mL  30 mL Oral Daily PRN Lovorn, MJinny Blossom MD   30 mL at 03/12/20 1158   sucralfate (CARAFATE) 1 GM/10ML suspension 1  g  1 g Oral TID WC & HS Reighan Hipolito, KRoselie Awkward NP   1 g at 03/18/20 1318   traMADol (ULTRAM) tablet 50 mg  50 mg Oral Q6H Lovorn, Megan, MD   50 mg at 03/18/20 1318   traZODone (DESYREL) tablet 100 mg  100 mg Oral QHS PRN Lovorn, Megan, MD       white petrolatum (VASELINE) gel   Topical PRN Lovorn, Megan, MD        REVIEW OF SYSTEMS:   Constitutional: ( - ) fevers, ( - )  chills , ( - ) night sweats Eyes: ( - ) blurriness of vision, ( - ) double vision, ( - ) watery eyes Ears, nose, mouth, throat, and face: ( + ) mucositis, ( - ) sore throat Respiratory: ( - ) cough, ( - ) dyspnea, ( - ) wheezes Cardiovascular: ( - ) palpitation, ( - ) chest discomfort, ( - ) lower extremity swelling Gastrointestinal:  ( - ) nausea, ( - ) heartburn, ( - ) change in bowel habits Skin: ( - ) abnormal skin rashes Lymphatics: ( - ) new lymphadenopathy, ( - ) easy bruising Neurological: ( - ) numbness, ( - ) tingling, ( - ) new weaknesses Behavioral/Psych: ( - ) mood change, ( - ) new changes  All other systems were reviewed with the patient and are negative.  PHYSICAL EXAMINATION:  GENERAL: Laying in bed, awake and alert, no distress SKIN: skin color, texture, turgor are normal, no rashes or significant lesions OROPHARYNX: Dried blisters on lips, appear herpetic and looked improved compared to prior exam  EYES: conjunctiva are pink and non-injected, sclera non-injected LUNGS: clear to auscultation and percussion with normal breathing effort HEART: regular rate & rhythm and no murmurs and no lower extremity edema ABDOMEN: soft, non-tender, non-distended, normal bowel sounds Musculoskeletal: no cyanosis of digits and no clubbing.  5/5 strength in the right lower extremity and 3/5 strength in the left lower extremity. PSYCH: alert & oriented x 3, fluent speech NEURO: no focal motor/sensory deficits  LABORATORY DATA:  I have reviewed the data as listed CBC Latest Ref Rng & Units 03/18/2020 03/17/2020 03/15/2020    WBC 4.0 - 10.5 K/uL 2.7(L) 3.5(L) 4.2  Hemoglobin 13.0 - 17.0 g/dL 6.8(LL) 7.7(L) 8.0(L)  Hematocrit 39 - 52 % 19.7(L) 22.4(L) 23.1(L)  Platelets 150 - 400 K/uL 49(L) 75(L) PLATELET CLUMPS NOTED ON SMEAR, UNABLE TO ESTIMATE    CMP Latest Ref Rng & Units 03/18/2020 03/17/2020 03/15/2020  Glucose 70 - 99 mg/dL 159(H) 228(H) 298(H)  BUN 6 - 20 mg/dL 33(H) 30(H) 40(H)  Creatinine 0.61 - 1.24 mg/dL 0.91 1.25(H) 1.24  Sodium 135 - 145 mmol/L 123(L) 127(L) 122(L)  Potassium 3.5 - 5.1 mmol/L 4.1 4.2 4.3  Chloride 98 - 111 mmol/L 93(L) 102 94(L)  CO2 22 -  32 mmol/L 19(L) 15(L) 18(L)  Calcium 8.9 - 10.3 mg/dL 8.3(L) 8.1(L) 7.9(L)  Total Protein 6.5 - 8.1 g/dL - - -  Total Bilirubin 0.3 - 1.2 mg/dL - - -  Alkaline Phos 38 - 126 U/L - - -  AST 15 - 41 U/L - - -  ALT 0 - 44 U/L - - -     RADIOGRAPHIC STUDIES: I have personally reviewed the radiological images as listed and agreed with the findings in the report pronounced lytic lesions and cord compression in thoracic spine.   DG Abd 1 View  Result Date: 03/13/2020 CLINICAL DATA:  Nausea, vomiting. EXAM: ABDOMEN - 1 VIEW COMPARISON:  March 10, 2020. FINDINGS: The bowel gas pattern is normal. Moderate amount of stool seen in right colon. No radio-opaque calculi or other significant radiographic abnormality are seen. IMPRESSION: Moderate stool burden is noted. No evidence of bowel obstruction or ileus. Electronically Signed   By: Marijo Conception M.D.   On: 03/13/2020 11:31   DG Abd 1 View  Result Date: 03/10/2020 CLINICAL DATA:  Abdominal pain. EXAM: ABDOMEN - 1 VIEW COMPARISON:  Radiograph 03/05/2020. FINDINGS: Mild gaseous distention of large and small bowel in a nonobstructive pattern. Small to moderate stool in the right colon, with diminished stool burden from prior exam. There is no evidence of free intra-abdominal air. No radiopaque calculi. No osseous abnormalities are seen. IMPRESSION: Bowel-gas pattern suggestive of ileus, with slight  improvement in gaseous bowel distension over the past few days. Electronically Signed   By: Keith Rake M.D.   On: 03/10/2020 14:13   DG Abd 1 View  Result Date: 03/05/2020 CLINICAL DATA:  Abdominal pain and bloating. EXAM: ABDOMEN - 1 VIEW COMPARISON:  Abdominal radiograph 03/03/2020, CT 02/14/2020 FINDINGS: No evidence of bowel obstruction on the supine views. Mild gaseous distention of stomach with slight improvement from prior. Mild gaseous small bowel distension and a generalized pattern. Moderate colonic stool burden. No abnormal rectal distention. No radiopaque calculi. No acute osseous abnormalities are seen. IMPRESSION: Ileus bowel gas pattern with mild gaseous distension of small bowel loops and stomach with slight improvement from recent exam. No evidence of developing obstruction. Moderate colonic stool burden. Electronically Signed   By: Keith Rake M.D.   On: 03/05/2020 15:47   DG Abd 1 View  Result Date: 03/03/2020 CLINICAL DATA:  Cord compression.  Left upper quadrant pain. EXAM: ABDOMEN - 1 VIEW COMPARISON:  CT 02/14/2020. FINDINGS: Distended loops of small and large bowel noted suggesting adynamic ileus. Recent CT of the abdomen pelvis of 02/14/2020 did reveal a right inguinal hernia with herniation of fat. No free air. Bony lesions present best identified by prior CT. IMPRESSION: Distended loops of small and large bowel noted suggesting adynamic ileus. Recent CT of the abdomen pelvis of 02/14/2020 however did reveal a right inguinal hernia with herniation of fat. Follow-up exams suggested to demonstrate resolution of bowel distention in order to exclude bowel obstruction. Electronically Signed   By: Marcello Moores  Register   On: 03/03/2020 13:56   IR Fluoro Guide CV Line Right  Result Date: 02/17/2020 CLINICAL DATA:  Renal failure and need for tunneled hemodialysis catheter to begin hemodialysis. EXAM: TUNNELED CENTRAL VENOUS HEMODIALYSIS CATHETER PLACEMENT WITH ULTRASOUND AND  FLUOROSCOPIC GUIDANCE ANESTHESIA/SEDATION: 1.0 mg IV Versed; 50 mcg IV Fentanyl. Total Moderate Sedation Time:   23 minutes. The patient's level of consciousness and physiologic status were continuously monitored during the procedure by Radiology nursing. MEDICATIONS: 2 g IV Ancef. FLUOROSCOPY  TIME:  48 seconds. 4.0 mGy. PROCEDURE: The procedure, risks, benefits, and alternatives were explained to the patient. Questions regarding the procedure were encouraged and answered. The patient understands and consents to the procedure. A timeout was performed prior to initiating the procedure. The right neck and chest were prepped with chlorhexidine in a sterile fashion, and a sterile drape was applied covering the operative field. Maximum barrier sterile technique with sterile gowns and gloves were used for the procedure. Local anesthesia was provided with 1% lidocaine. Ultrasound was used to confirm patency of the right internal jugular vein. After creating a small venotomy incision, a 21 gauge needle was advanced into the right internal jugular vein under direct, real-time ultrasound guidance. Ultrasound image documentation was performed. After securing guidewire access, an 8 Fr dilator was placed. A J-wire was kinked to measure appropriate catheter length. A Palindrome tunneled hemodialysis catheter measuring 19 cm from tip to cuff was chosen for placement. This was tunneled in a retrograde fashion from the chest wall to the venotomy incision. At the venotomy, serial dilatation was performed and a 15 Fr peel-away sheath was placed over a guidewire. The catheter was then placed through the sheath and the sheath removed. Final catheter positioning was confirmed and documented with a fluoroscopic spot image. The catheter was aspirated, flushed with saline, and injected with appropriate volume heparin dwells. The venotomy incision was closed with subcuticular 4-0 Vicryl. Dermabond was applied to the incision. The catheter  exit site was secured with 0-Prolene retention sutures. COMPLICATIONS: None.  No pneumothorax. FINDINGS: After catheter placement, the tip lies in the right atrium. The catheter aspirates normally and is ready for immediate use. IMPRESSION: Placement of tunneled hemodialysis catheter via the right internal jugular vein. The catheter tip lies in the right atrium. The catheter is ready for immediate use. Electronically Signed   By: Aletta Edouard M.D.   On: 02/17/2020 17:22   IR Removal Tun Cv Cath W/O FL  Result Date: 03/01/2020 INDICATION: Patient with history of multiple myeloma with acute kidney injury and placement of tunneled right internal jugular HD catheter on 02/17/2020; now with improving creatinine and urine output. Request received for HD catheter removal. EXAM: REMOVAL TUNNELED CENTRAL VENOUS CATHETER MEDICATIONS: None ANESTHESIA/SEDATION: None FLUOROSCOPY TIME:  None COMPLICATIONS: None immediate. PROCEDURE: Informed written consent was obtained from the patient after a thorough discussion of the procedural risks, benefits and alternatives. All questions were addressed. Maximal Sterile Barrier Technique was utilized including caps, mask, sterile gowns, sterile gloves, sterile drape, hand hygiene and skin antiseptic. A timeout was performed prior to the initiation of the procedure. The patient's right chest and catheter was prepped and draped in a normal sterile fashion. Heparin was removed from both ports of catheter. Using gentle manual traction the cuff of the catheter was exposed and the catheter was removed in it's entirety. Pressure was held till hemostasis was obtained. A sterile dressing was applied. The patient tolerated the procedure well with no immediate complications. IMPRESSION: Successful catheter removal as described above. Read by: Rowe Robert, PA-C Electronically Signed   By: Jacqulynn Cadet M.D.   On: 03/01/2020 13:40   IR US Guide Vasc Access Right  Result Date:  02/17/2020 CLINICAL DATA:  Renal failure and need for tunneled hemodialysis catheter to begin hemodialysis. EXAM: TUNNELED CENTRAL VENOUS HEMODIALYSIS CATHETER PLACEMENT WITH ULTRASOUND AND FLUOROSCOPIC GUIDANCE ANESTHESIA/SEDATION: 1.0 mg IV Versed; 50 mcg IV Fentanyl. Total Moderate Sedation Time:   23 minutes. The patient's level of consciousness and physiologic status  were continuously monitored during the procedure by Radiology nursing. MEDICATIONS: 2 g IV Ancef. FLUOROSCOPY TIME:  48 seconds. 4.0 mGy. PROCEDURE: The procedure, risks, benefits, and alternatives were explained to the patient. Questions regarding the procedure were encouraged and answered. The patient understands and consents to the procedure. A timeout was performed prior to initiating the procedure. The right neck and chest were prepped with chlorhexidine in a sterile fashion, and a sterile drape was applied covering the operative field. Maximum barrier sterile technique with sterile gowns and gloves were used for the procedure. Local anesthesia was provided with 1% lidocaine. Ultrasound was used to confirm patency of the right internal jugular vein. After creating a small venotomy incision, a 21 gauge needle was advanced into the right internal jugular vein under direct, real-time ultrasound guidance. Ultrasound image documentation was performed. After securing guidewire access, an 8 Fr dilator was placed. A J-wire was kinked to measure appropriate catheter length. A Palindrome tunneled hemodialysis catheter measuring 19 cm from tip to cuff was chosen for placement. This was tunneled in a retrograde fashion from the chest wall to the venotomy incision. At the venotomy, serial dilatation was performed and a 15 Fr peel-away sheath was placed over a guidewire. The catheter was then placed through the sheath and the sheath removed. Final catheter positioning was confirmed and documented with a fluoroscopic spot image. The catheter was aspirated,  flushed with saline, and injected with appropriate volume heparin dwells. The venotomy incision was closed with subcuticular 4-0 Vicryl. Dermabond was applied to the incision. The catheter exit site was secured with 0-Prolene retention sutures. COMPLICATIONS: None.  No pneumothorax. FINDINGS: After catheter placement, the tip lies in the right atrium. The catheter aspirates normally and is ready for immediate use. IMPRESSION: Placement of tunneled hemodialysis catheter via the right internal jugular vein. The catheter tip lies in the right atrium. The catheter is ready for immediate use. Electronically Signed   By: Aletta Edouard M.D.   On: 02/17/2020 17:22   DG CHEST PORT 1 VIEW  Result Date: 02/25/2020 CLINICAL DATA:  Left-sided rib pain, back pain EXAM: PORTABLE CHEST 1 VIEW COMPARISON:  01/26/2020 FINDINGS: Single frontal view of the chest demonstrates a stable cardiac silhouette. Left lower lobe consolidation is seen within the retrocardiac region. No effusion or pneumothorax. Right internal jugular dialysis catheter tip overlies superior vena cava. Small lytic lesions are seen throughout the thoracic cage and left scapula. The lytic thoracic spine lesion seen on recent CT are more difficult to appreciate on portable x-ray. IMPRESSION: 1. Dense left lower lobe consolidation, favor atelectasis. 2. Diffuse lytic lesions throughout the visualized bony structures, consistent with multiple myeloma. Electronically Signed   By: Randa Ngo M.D.   On: 02/25/2020 17:52   CT BONE MARROW BIOPSY & ASPIRATION  Result Date: 02/19/2020 CLINICAL DATA:  Clinical suspicion multiple myeloma and need for bone marrow biopsy. EXAM: CT GUIDED BONE MARROW ASPIRATION AND BIOPSY ANESTHESIA/SEDATION: Versed 1.0 mg IV, Fentanyl 50 mcg IV Total Moderate Sedation Time:   14 minutes. The patient's level of consciousness and physiologic status were continuously monitored during the procedure by Radiology nursing. PROCEDURE: The  procedure risks, benefits, and alternatives were explained to the patient. Questions regarding the procedure were encouraged and answered. The patient understands and consents to the procedure. A time out was performed prior to initiating the procedure. The right gluteal region was prepped with chlorhexidine. Sterile gown and sterile gloves were used for the procedure. Local anesthesia was provided with 1% Lidocaine.  Under CT guidance, an 11 gauge On Control bone cutting needle was advanced from a posterior approach into the right iliac bone. Needle positioning was confirmed with CT. Initial non heparinized and heparinized aspirate samples were obtained of bone marrow. Core biopsy was performed via the On Control drill needle. COMPLICATIONS: None FINDINGS: Inspection of initial aspirate did reveal visible particles. Intact core biopsy sample was obtained. IMPRESSION: CT guided bone marrow biopsy of right posterior iliac bone with both aspirate and core samples obtained. Electronically Signed   By: Aletta Edouard M.D.   On: 02/19/2020 12:21   VAS [place] LOWER EXTREMITY VENOUS (DVT)  Result Date: 03/03/2020  Lower Venous DVTStudy Indications: Immobility.  Performing Technologist: Griffin Basil RCT RDMS  Examination Guidelines: A complete evaluation includes B-mode imaging, spectral Doppler, color Doppler, and power Doppler as needed of all accessible portions of each vessel. Bilateral testing is considered an integral part of a complete examination. Limited examinations for reoccurring indications may be performed as noted. The reflux portion of the exam is performed with the patient in reverse Trendelenburg.  +---------+---------------+---------+-----------+----------+--------------+  RIGHT     Compressibility Phasicity Spontaneity Properties Thrombus Aging  +---------+---------------+---------+-----------+----------+--------------+  CFV       Full            Yes       Yes                                     +---------+---------------+---------+-----------+----------+--------------+  SFJ       Full                                                             +---------+---------------+---------+-----------+----------+--------------+  FV Prox   Full                                                             +---------+---------------+---------+-----------+----------+--------------+  FV Mid    Full                                                             +---------+---------------+---------+-----------+----------+--------------+  FV Distal Full                                                             +---------+---------------+---------+-----------+----------+--------------+  PFV       Full                                                             +---------+---------------+---------+-----------+----------+--------------+  POP       Full            Yes       Yes                                    +---------+---------------+---------+-----------+----------+--------------+  PTV       Full                                                             +---------+---------------+---------+-----------+----------+--------------+  PERO      Full                                                             +---------+---------------+---------+-----------+----------+--------------+   +---------+---------------+---------+-----------+----------+--------------+  LEFT      Compressibility Phasicity Spontaneity Properties Thrombus Aging  +---------+---------------+---------+-----------+----------+--------------+  CFV       Full            Yes       Yes                                    +---------+---------------+---------+-----------+----------+--------------+  SFJ       Full                                                             +---------+---------------+---------+-----------+----------+--------------+  FV Prox   Full                                                              +---------+---------------+---------+-----------+----------+--------------+  FV Mid    Full                                                             +---------+---------------+---------+-----------+----------+--------------+  FV Distal Full                                                             +---------+---------------+---------+-----------+----------+--------------+  PFV       Full                                                             +---------+---------------+---------+-----------+----------+--------------+  POP       Full            Yes       Yes                                    +---------+---------------+---------+-----------+----------+--------------+  PTV       Full                                                             +---------+---------------+---------+-----------+----------+--------------+  PERO      Full                                                             +---------+---------------+---------+-----------+----------+--------------+     Summary: RIGHT: - There is no evidence of deep vein thrombosis in the lower extremity.  - No cystic structure found in the popliteal fossa.  LEFT: - There is no evidence of deep vein thrombosis in the lower extremity.  - No cystic structure found in the popliteal fossa.  *See table(s) above for measurements and observations. Electronically signed by Monica Martinez MD on 03/03/2020 at 3:26:05 PM.    Final     ASSESSMENT & PLAN Trevor Shelton 54 y.o. male with medical history significant for HTN and HLD who presented with back pain and was found to have cord compression and lytic lesions concerning for multiple myeloma. The patient's imaging and blood work are most concerning for multiple myeloma.  Interestingly the M spike and the serum free light chain ratio is relatively modest compared to the extent of the damage seen on CT scan.  The patient had significant hypercalcemia upon presentation. Bone marrow biopsy was performed on 02/19/2020  and was consistent with a plasma cell neoplasm.  Cytogenetics were normal.  He received radiation for impending cord compression which was completed on 03/02/2020.   Recommend CyBorD chemotherapy.  Since the patient will remain in inpatient rehab until about 03/30/2020 )has been extended by 1 week), we will go ahead and initiate chemotherapy starting Tuesday, 03/15/2020.  While inpatient, will administer subcutaneous Velcade and dexamethasone and will hold off on Cytoxan until after he is discharged.  SPEP 02/15/2020: M protein 0.9 SFLC: kappa 37.5, Lambda 9.9, ratio 3.79 UPEP: 02/17/2020: M spike not observed.  Immunofixation showed IgG monoclonal protein with kappa light chain specificity   # Lytic Lesions Througout Skeleton/Hypercalcemia # Pathologic Fractures of Spine/Soft Tissue Masses in Spine #Concern for Multiple Myeloma --bone marrow biopsy performed 02/19/2020 to confirm diagnosis; results consistent with plasma cell neoplasm.  Normal cytogenetics. --M protein and Kappa ratio are modestly elevated compared the the extent of damage seen on CT/MRI. 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  We will continue to closely monitor and consider additional bisphosphonate therapy as an outpatient. --The patient has completed radiation to the impending cord compression.  Per radiation oncology, we are tapering dexamethasone.  Recommended taper is for dexamethasone  4 mg every 12 hours x1 week then 2 mg every 12 hours x1 week, and then 2 mg daily for 1 week, and then 2 mg every other day x2 weeks and then stop.  He started dexamethasone 2 mg every 12 hours dosing today.  We will plan to hold his twice daily dosing on Tuesday, 03/22/2020 when he will receive his chemotherapy again which will be given with dexamethasone 40 mg.  We will then resume his 2 mg  twice daily dosing on 9/15. --Recommend for the patient to proceed with CyBorD chemotherapy.  Given his continued hospitalization, he started his first dose on 03/15/2020 and has overall tolerated it well.  We will hold on Cytoxan until after he is discharged. --We again discussed his diagnosis and treatment recommendation.  I reviewed the side effects of Velcade including but not limited to diarrhea and peripheral neuropathy.  The patient agrees to proceed and has signed an informed consent which was placed on a shadow chart.  --He started acyclovir 400 mg daily on 03/15/2020.  Mucositis/Mouth Sores --Unrelated to chemo as this issue predated the chemo administration --Recommend supportive care --Continue sodium bicarbonate mouth rinses and Magic mouthwash --Continue acyclovir ointment to the lips.  Intermittent pancytopenia --This is likely multifactorial due to underlying malignancy, recent epistaxis, and recent chemotherapy although Velcade does not cause significant pancytopenia. --Monitor periodically --Transfuse PRBCs for hemoglobin less than 7 for platelet count less than 20,000 or 10,000 if actively bleeding. --Agree with holding Lovenox given recent epistaxis and thrombocytopenia.   Mikey Bussing, DNP, AGPCNP-BC, AOCNP Mon/Tues/Thurs/Fri 7am-5pm; Off Wednesdays Cell: [phone]  03/18/2020 4:15 PM

## 2020-03-18 NOTE — Progress Notes (Signed)
The lab was called this morning to inquire about stat lab that was ordered earlier. Provider was informed that it had not been done. This nurse called the lab & was told to call the phlebotomist. Was not able to get the phlebotomist, line was busy. Tried multiple times. On coming charge nurse & his nurse was informed.

## 2020-03-18 NOTE — Progress Notes (Signed)
Since pt only getting velcade for now, will change aloxi to zofran 8mg  po and dex to 40mg  po.  When cyclophosphamide added, will resume aloxi and dex 40 IV

## 2020-03-18 NOTE — Significant Event (Signed)
Not a Rapid Response Event (Second opinion)  Reason for Call : Lethargy and Nose bleed   Initial Focused Assessment:   Nursing staff notified me due to pt being more lethargic and more nose bleeding. Nose bleed is not new and pt was evaluated and plan formed by ENT Constance Holster MD. CBG 201. With the assistance of the interpreter, Mr. Holdren is arousable and able to follow simple commands. No focal deficit assessed. Pt very weak but equally weak bilaterally. Primary svc notified and orders received.    Interventions:  -No RRT interventions   MD Notified:  Silverio Lay PA Call Time: [place] Time: 0120 End Time: 0135  Madelynn Done, RN

## 2020-03-18 NOTE — Progress Notes (Signed)
Occupational Therapy Note  Patient Details  Name: Trevor Shelton MRN: [id number] Date of Birth: April 18, 1966  Today's Date: 03/18/2020 OT Missed Time: 54 Minutes Missed Time Reason: MD hold (comment);Patient ill (comment);Patient fatigue  Pt missed 60 mins skilled OT services. Per MD, therapies on hold.    [name] [facility] 03/18/2020, 8:18 AM

## 2020-03-19 ENCOUNTER — Inpatient Hospital Stay (HOSPITAL_COMMUNITY): Payer: Self-pay | Admitting: Physical Therapy

## 2020-03-19 ENCOUNTER — Inpatient Hospital Stay (HOSPITAL_COMMUNITY): Payer: Self-pay

## 2020-03-19 ENCOUNTER — Inpatient Hospital Stay (HOSPITAL_COMMUNITY)
Admission: AD | Admit: 2020-03-19 | Discharge: 2020-05-09 | DRG: 871 | Disposition: E | Payer: Self-pay | Source: Ambulatory Visit | Attending: Internal Medicine | Admitting: Internal Medicine

## 2020-03-19 DIAGNOSIS — D61818 Other pancytopenia: Secondary | ICD-10-CM

## 2020-03-19 DIAGNOSIS — E119 Type 2 diabetes mellitus without complications: Secondary | ICD-10-CM

## 2020-03-19 DIAGNOSIS — R627 Adult failure to thrive: Secondary | ICD-10-CM

## 2020-03-19 DIAGNOSIS — I10 Essential (primary) hypertension: Secondary | ICD-10-CM | POA: Diagnosis present

## 2020-03-19 DIAGNOSIS — G8222 Paraplegia, incomplete: Secondary | ICD-10-CM | POA: Diagnosis present

## 2020-03-19 DIAGNOSIS — M8440XA Pathological fracture, unspecified site, initial encounter for fracture: Secondary | ICD-10-CM | POA: Diagnosis present

## 2020-03-19 DIAGNOSIS — D61810 Antineoplastic chemotherapy induced pancytopenia: Secondary | ICD-10-CM | POA: Diagnosis present

## 2020-03-19 DIAGNOSIS — Z66 Do not resuscitate: Secondary | ICD-10-CM

## 2020-03-19 DIAGNOSIS — R0602 Shortness of breath: Secondary | ICD-10-CM

## 2020-03-19 DIAGNOSIS — Z6823 Body mass index (BMI) 23.0-23.9, adult: Secondary | ICD-10-CM

## 2020-03-19 DIAGNOSIS — R109 Unspecified abdominal pain: Secondary | ICD-10-CM

## 2020-03-19 DIAGNOSIS — E7800 Pure hypercholesterolemia, unspecified: Secondary | ICD-10-CM | POA: Diagnosis present

## 2020-03-19 DIAGNOSIS — N179 Acute kidney failure, unspecified: Secondary | ICD-10-CM | POA: Diagnosis present

## 2020-03-19 DIAGNOSIS — R0600 Dyspnea, unspecified: Secondary | ICD-10-CM

## 2020-03-19 DIAGNOSIS — L89322 Pressure ulcer of left buttock, stage 2: Secondary | ICD-10-CM | POA: Diagnosis present

## 2020-03-19 DIAGNOSIS — G9341 Metabolic encephalopathy: Secondary | ICD-10-CM | POA: Diagnosis present

## 2020-03-19 DIAGNOSIS — G893 Neoplasm related pain (acute) (chronic): Secondary | ICD-10-CM

## 2020-03-19 DIAGNOSIS — D62 Acute posthemorrhagic anemia: Secondary | ICD-10-CM

## 2020-03-19 DIAGNOSIS — R4182 Altered mental status, unspecified: Secondary | ICD-10-CM

## 2020-03-19 DIAGNOSIS — F419 Anxiety disorder, unspecified: Secondary | ICD-10-CM | POA: Diagnosis present

## 2020-03-19 DIAGNOSIS — J948 Other specified pleural conditions: Secondary | ICD-10-CM

## 2020-03-19 DIAGNOSIS — E871 Hypo-osmolality and hyponatremia: Secondary | ICD-10-CM | POA: Diagnosis present

## 2020-03-19 DIAGNOSIS — Y95 Nosocomial condition: Secondary | ICD-10-CM | POA: Diagnosis present

## 2020-03-19 DIAGNOSIS — G903 Multi-system degeneration of the autonomic nervous system: Secondary | ICD-10-CM

## 2020-03-19 DIAGNOSIS — E46 Unspecified protein-calorie malnutrition: Secondary | ICD-10-CM | POA: Diagnosis present

## 2020-03-19 DIAGNOSIS — E876 Hypokalemia: Secondary | ICD-10-CM | POA: Diagnosis not present

## 2020-03-19 DIAGNOSIS — G4089 Other seizures: Secondary | ICD-10-CM | POA: Diagnosis not present

## 2020-03-19 DIAGNOSIS — G9520 Unspecified cord compression: Secondary | ICD-10-CM | POA: Diagnosis present

## 2020-03-19 DIAGNOSIS — L89152 Pressure ulcer of sacral region, stage 2: Secondary | ICD-10-CM | POA: Diagnosis present

## 2020-03-19 DIAGNOSIS — J9601 Acute respiratory failure with hypoxia: Secondary | ICD-10-CM | POA: Diagnosis present

## 2020-03-19 DIAGNOSIS — R6520 Severe sepsis without septic shock: Secondary | ICD-10-CM | POA: Diagnosis present

## 2020-03-19 DIAGNOSIS — R5081 Fever presenting with conditions classified elsewhere: Secondary | ICD-10-CM | POA: Diagnosis present

## 2020-03-19 DIAGNOSIS — R040 Epistaxis: Secondary | ICD-10-CM | POA: Diagnosis present

## 2020-03-19 DIAGNOSIS — J90 Pleural effusion, not elsewhere classified: Secondary | ICD-10-CM | POA: Diagnosis not present

## 2020-03-19 DIAGNOSIS — R410 Disorientation, unspecified: Secondary | ICD-10-CM

## 2020-03-19 DIAGNOSIS — E872 Acidosis: Secondary | ICD-10-CM | POA: Diagnosis present

## 2020-03-19 DIAGNOSIS — B9561 Methicillin susceptible Staphylococcus aureus infection as the cause of diseases classified elsewhere: Secondary | ICD-10-CM | POA: Diagnosis present

## 2020-03-19 DIAGNOSIS — Z20822 Contact with and (suspected) exposure to covid-19: Secondary | ICD-10-CM | POA: Diagnosis present

## 2020-03-19 DIAGNOSIS — Z515 Encounter for palliative care: Secondary | ICD-10-CM

## 2020-03-19 DIAGNOSIS — I619 Nontraumatic intracerebral hemorrhage, unspecified: Secondary | ICD-10-CM | POA: Diagnosis present

## 2020-03-19 DIAGNOSIS — R069 Unspecified abnormalities of breathing: Secondary | ICD-10-CM

## 2020-03-19 DIAGNOSIS — C9000 Multiple myeloma not having achieved remission: Secondary | ICD-10-CM

## 2020-03-19 DIAGNOSIS — E861 Hypovolemia: Secondary | ICD-10-CM | POA: Diagnosis present

## 2020-03-19 DIAGNOSIS — K5900 Constipation, unspecified: Secondary | ICD-10-CM | POA: Diagnosis present

## 2020-03-19 DIAGNOSIS — A4101 Sepsis due to Methicillin susceptible Staphylococcus aureus: Principal | ICD-10-CM | POA: Diagnosis present

## 2020-03-19 DIAGNOSIS — N400 Enlarged prostate without lower urinary tract symptoms: Secondary | ICD-10-CM | POA: Diagnosis present

## 2020-03-19 DIAGNOSIS — T451X5A Adverse effect of antineoplastic and immunosuppressive drugs, initial encounter: Secondary | ICD-10-CM | POA: Diagnosis present

## 2020-03-19 DIAGNOSIS — Z794 Long term (current) use of insulin: Secondary | ICD-10-CM

## 2020-03-19 DIAGNOSIS — R0682 Tachypnea, not elsewhere classified: Secondary | ICD-10-CM

## 2020-03-19 DIAGNOSIS — Z9221 Personal history of antineoplastic chemotherapy: Secondary | ICD-10-CM

## 2020-03-19 DIAGNOSIS — R222 Localized swelling, mass and lump, trunk: Secondary | ICD-10-CM | POA: Diagnosis present

## 2020-03-19 DIAGNOSIS — J189 Pneumonia, unspecified organism: Secondary | ICD-10-CM | POA: Diagnosis present

## 2020-03-19 DIAGNOSIS — R5381 Other malaise: Secondary | ICD-10-CM | POA: Diagnosis present

## 2020-03-19 DIAGNOSIS — K567 Ileus, unspecified: Secondary | ICD-10-CM

## 2020-03-19 DIAGNOSIS — D709 Neutropenia, unspecified: Secondary | ICD-10-CM | POA: Diagnosis present

## 2020-03-19 DIAGNOSIS — Z79899 Other long term (current) drug therapy: Secondary | ICD-10-CM

## 2020-03-19 DIAGNOSIS — G4700 Insomnia, unspecified: Secondary | ICD-10-CM | POA: Diagnosis not present

## 2020-03-19 DIAGNOSIS — E1165 Type 2 diabetes mellitus with hyperglycemia: Secondary | ICD-10-CM | POA: Diagnosis present

## 2020-03-19 DIAGNOSIS — Z923 Personal history of irradiation: Secondary | ICD-10-CM

## 2020-03-19 DIAGNOSIS — T380X5A Adverse effect of glucocorticoids and synthetic analogues, initial encounter: Secondary | ICD-10-CM | POA: Diagnosis present

## 2020-03-19 DIAGNOSIS — R509 Fever, unspecified: Secondary | ICD-10-CM

## 2020-03-19 DIAGNOSIS — M8458XA Pathological fracture in neoplastic disease, other specified site, initial encounter for fracture: Secondary | ICD-10-CM | POA: Diagnosis present

## 2020-03-19 DIAGNOSIS — E785 Hyperlipidemia, unspecified: Secondary | ICD-10-CM | POA: Diagnosis present

## 2020-03-19 DIAGNOSIS — R52 Pain, unspecified: Secondary | ICD-10-CM

## 2020-03-19 DIAGNOSIS — I611 Nontraumatic intracerebral hemorrhage in hemisphere, cortical: Secondary | ICD-10-CM | POA: Diagnosis present

## 2020-03-19 LAB — CBC WITH DIFFERENTIAL/PLATELET
Abs Immature Granulocytes: 0.2 10*3/uL — ABNORMAL HIGH (ref 0.00–0.07)
Basophils Absolute: 0 10*3/uL (ref 0.0–0.1)
Basophils Relative: 1 %
Eosinophils Absolute: 0 10*3/uL (ref 0.0–0.5)
Eosinophils Relative: 1 %
HCT: 31.5 % — ABNORMAL LOW (ref 39.0–52.0)
Hemoglobin: 11.1 g/dL — ABNORMAL LOW (ref 13.0–17.0)
Immature Granulocytes: 7 %
Lymphocytes Relative: 12 %
Lymphs Abs: 0.3 10*3/uL — ABNORMAL LOW (ref 0.7–4.0)
MCH: 29.8 pg (ref 26.0–34.0)
MCHC: 35.2 g/dL (ref 30.0–36.0)
MCV: 84.7 fL (ref 80.0–100.0)
Monocytes Absolute: 0.2 10*3/uL (ref 0.1–1.0)
Monocytes Relative: 6 %
Neutro Abs: 2.1 10*3/uL (ref 1.7–7.7)
Neutrophils Relative %: 73 %
Platelets: 47 10*3/uL — ABNORMAL LOW (ref 150–400)
RBC: 3.72 MIL/uL — ABNORMAL LOW (ref 4.22–5.81)
RDW: 13.2 % (ref 11.5–15.5)
WBC: 2.9 10*3/uL — ABNORMAL LOW (ref 4.0–10.5)
nRBC: 2.8 % — ABNORMAL HIGH (ref 0.0–0.2)

## 2020-03-19 LAB — COMPREHENSIVE METABOLIC PANEL
ALT: 30 U/L (ref 0–44)
AST: 38 U/L (ref 15–41)
Albumin: 1.9 g/dL — ABNORMAL LOW (ref 3.5–5.0)
Alkaline Phosphatase: 94 U/L (ref 38–126)
Anion gap: 9 (ref 5–15)
BUN: 47 mg/dL — ABNORMAL HIGH (ref 6–20)
CO2: 19 mmol/L — ABNORMAL LOW (ref 22–32)
Calcium: 8.3 mg/dL — ABNORMAL LOW (ref 8.9–10.3)
Chloride: 86 mmol/L — ABNORMAL LOW (ref 98–111)
Creatinine, Ser: 1.06 mg/dL (ref 0.61–1.24)
GFR calc Af Amer: >60 mL/min (ref >60)
GFR calc non Af Amer: >60 mL/min (ref >60)
Glucose, Bld: 227 mg/dL — ABNORMAL HIGH (ref 70–99)
Potassium: 4.6 mmol/L (ref 3.5–5.1)
Sodium: 114 mmol/L — CL (ref 135–145)
Total Bilirubin: 0.6 mg/dL (ref 0.3–1.2)
Total Protein: 6.3 g/dL — ABNORMAL LOW (ref 6.5–8.1)

## 2020-03-19 LAB — URINALYSIS, ROUTINE W REFLEX MICROSCOPIC
Bilirubin Urine: NEGATIVE
Glucose, UA: 50 mg/dL — AB
Hgb urine dipstick: NEGATIVE
Ketones, ur: NEGATIVE mg/dL
Leukocytes,Ua: NEGATIVE
Nitrite: NEGATIVE
Protein, ur: NEGATIVE mg/dL
Specific Gravity, Urine: 1.016 (ref 1.005–1.030)
pH: 5 (ref 5.0–8.0)

## 2020-03-19 LAB — CBC
HCT: 28.8 % — ABNORMAL LOW (ref 39.0–52.0)
Hemoglobin: 10 g/dL — ABNORMAL LOW (ref 13.0–17.0)
MCH: 28.8 pg (ref 26.0–34.0)
MCHC: 34.7 g/dL (ref 30.0–36.0)
MCV: 83 fL (ref 80.0–100.0)
Platelets: 46 10*3/uL — ABNORMAL LOW (ref 150–400)
RBC: 3.47 MIL/uL — ABNORMAL LOW (ref 4.22–5.81)
RDW: 12.9 % (ref 11.5–15.5)
WBC: 4.6 10*3/uL (ref 4.0–10.5)
nRBC: 1.9 % — ABNORMAL HIGH (ref 0.0–0.2)

## 2020-03-19 LAB — BPAM RBC
Blood Product Expiration Date: 202110082359
Blood Product Expiration Date: 202110082359
ISSUE DATE / TIME: 202109101350
ISSUE DATE / TIME: 202109101710
Unit Type and Rh: 5100
Unit Type and Rh: 5100

## 2020-03-19 LAB — TYPE AND SCREEN
ABO/RH(D): O POS
Antibody Screen: NEGATIVE
Unit division: 0
Unit division: 0

## 2020-03-19 LAB — GLUCOSE, CAPILLARY
Glucose-Capillary: 124 mg/dL — ABNORMAL HIGH (ref 70–99)
Glucose-Capillary: 188 mg/dL — ABNORMAL HIGH (ref 70–99)
Glucose-Capillary: 209 mg/dL — ABNORMAL HIGH (ref 70–99)
Glucose-Capillary: 228 mg/dL — ABNORMAL HIGH (ref 70–99)

## 2020-03-19 LAB — SARS CORONAVIRUS 2 BY RT PCR (HOSPITAL ORDER, PERFORMED IN ~~LOC~~ HOSPITAL LAB): SARS Coronavirus 2: NEGATIVE

## 2020-03-19 MED ORDER — SODIUM CHLORIDE 0.9 % IV SOLN: INTRAVENOUS | Status: DC

## 2020-03-19 MED ORDER — ALUM & MAG HYDROXIDE-SIMETH 200-200-20 MG/5ML PO SUSP
30.0000 mL | ORAL | Status: DC | PRN
  Administered 2020-03-28: 30 mL via ORAL
  Filled 2020-03-19 (×2): qty 30

## 2020-03-19 MED ORDER — AMLODIPINE BESYLATE 10 MG PO TABS: 10.0000 mg | ORAL_TABLET | Freq: Every day | ORAL | Status: DC

## 2020-03-19 MED ORDER — VANCOMYCIN HCL 1250 MG/250ML IV SOLN: 1250.0000 mg | Freq: Once | INTRAVENOUS | Status: DC

## 2020-03-19 MED ORDER — LIDOCAINE HCL URETHRAL/MUCOSAL 2 % EX GEL
CUTANEOUS | Status: DC | PRN
  Filled 2020-03-19: qty 11

## 2020-03-19 MED ORDER — SODIUM BICARBONATE/SODIUM CHLORIDE MOUTHWASH
OROMUCOSAL | Status: DC | PRN
  Filled 2020-03-19: qty 1000

## 2020-03-19 MED ORDER — ACETAMINOPHEN 650 MG RE SUPP: 650.0000 mg | Freq: Four times a day (QID) | RECTAL | Status: DC | PRN

## 2020-03-19 MED ORDER — ACETAMINOPHEN 325 MG PO TABS
650.0000 mg | ORAL_TABLET | Freq: Four times a day (QID) | ORAL | Status: DC | PRN
  Administered 2020-03-19 – 2020-03-25 (×7): 650 mg via ORAL
  Administered 2020-03-26: 325 mg via ORAL
  Administered 2020-03-27 – 2020-03-28 (×2): 650 mg via ORAL
  Filled 2020-03-19 (×12): qty 2

## 2020-03-19 MED ORDER — OXYMETAZOLINE HCL 0.05 % NA SOLN
2.0000 | Freq: Three times a day (TID) | NASAL | Status: AC
  Administered 2020-03-20: 2 via NASAL
  Filled 2020-03-19: qty 30

## 2020-03-19 MED ORDER — BACLOFEN 10 MG PO TABS
10.0000 mg | ORAL_TABLET | Freq: Four times a day (QID) | ORAL | Status: DC
  Administered 2020-03-20 – 2020-03-21 (×5): 10 mg via ORAL
  Filled 2020-03-19 (×6): qty 1

## 2020-03-19 MED ORDER — SENNOSIDES-DOCUSATE SODIUM 8.6-50 MG PO TABS
2.0000 | ORAL_TABLET | Freq: Two times a day (BID) | ORAL | Status: DC
  Administered 2020-03-23 – 2020-04-05 (×13): 2 via ORAL
  Filled 2020-03-19 (×19): qty 2

## 2020-03-19 MED ORDER — CLONAZEPAM 0.125 MG PO TBDP
0.2500 mg | ORAL_TABLET | Freq: Two times a day (BID) | ORAL | Status: DC | PRN
  Administered 2020-03-21: 0.25 mg via ORAL
  Filled 2020-03-19: qty 2

## 2020-03-19 MED ORDER — ACETAMINOPHEN 325 MG PO TABS: 325.0000 mg | ORAL_TABLET | ORAL | Status: DC | PRN

## 2020-03-19 MED ORDER — WHITE PETROLATUM EX OINT
TOPICAL_OINTMENT | CUTANEOUS | Status: DC | PRN
  Administered 2020-03-24: 0.2 via TOPICAL
  Filled 2020-03-19 (×3): qty 28.35

## 2020-03-19 MED ORDER — QUETIAPINE FUMARATE 50 MG PO TABS
100.0000 mg | ORAL_TABLET | Freq: Every day | ORAL | Status: DC
  Administered 2020-03-20: 100 mg via ORAL
  Filled 2020-03-19 (×2): qty 2

## 2020-03-19 MED ORDER — DEXAMETHASONE 2 MG PO TABS
2.0000 mg | ORAL_TABLET | Freq: Two times a day (BID) | ORAL | Status: DC
  Administered 2020-03-20 – 2020-03-21 (×2): 2 mg via ORAL
  Filled 2020-03-19 (×4): qty 1

## 2020-03-19 MED ORDER — SALINE SPRAY 0.65 % NA SOLN
2.0000 | NASAL | Status: DC
  Administered 2020-03-20 – 2020-04-05 (×147): 2 via NASAL
  Filled 2020-03-19 (×4): qty 44

## 2020-03-19 MED ORDER — FLUCONAZOLE 100 MG PO TABS
100.0000 mg | ORAL_TABLET | Freq: Every day | ORAL | Status: AC
  Administered 2020-03-21 – 2020-03-22 (×2): 100 mg via ORAL
  Filled 2020-03-19 (×3): qty 1

## 2020-03-19 MED ORDER — LIDOCAINE VISCOUS HCL 2 % MT SOLN
15.0000 mL | Freq: Four times a day (QID) | OROMUCOSAL | Status: DC | PRN
  Filled 2020-03-19: qty 15

## 2020-03-19 MED ORDER — PROCHLORPERAZINE EDISYLATE 10 MG/2ML IJ SOLN: 5.0000 mg | Freq: Four times a day (QID) | INTRAMUSCULAR | Status: DC | PRN

## 2020-03-19 MED ORDER — PANTOPRAZOLE SODIUM 40 MG PO TBEC
40.0000 mg | DELAYED_RELEASE_TABLET | Freq: Two times a day (BID) | ORAL | Status: DC
  Administered 2020-03-20 – 2020-04-06 (×35): 40 mg via ORAL
  Filled 2020-03-19 (×35): qty 1

## 2020-03-19 MED ORDER — MIDODRINE HCL 5 MG PO TABS
7.5000 mg | ORAL_TABLET | Freq: Two times a day (BID) | ORAL | Status: DC
  Administered 2020-03-20 – 2020-03-26 (×11): 7.5 mg via ORAL
  Filled 2020-03-19 (×12): qty 2

## 2020-03-19 MED ORDER — GUAIFENESIN-DM 100-10 MG/5ML PO SYRP: 5.0000 mL | ORAL_SOLUTION | Freq: Four times a day (QID) | ORAL | Status: DC | PRN

## 2020-03-19 MED ORDER — MAGIC MOUTHWASH W/LIDOCAINE
5.0000 mL | Freq: Four times a day (QID) | ORAL | Status: DC | PRN
  Filled 2020-03-19: qty 5

## 2020-03-19 MED ORDER — INSULIN GLARGINE 100 UNIT/ML ~~LOC~~ SOLN
30.0000 [IU] | Freq: Every day | SUBCUTANEOUS | Status: DC
  Administered 2020-03-19: 30 [IU] via SUBCUTANEOUS
  Filled 2020-03-19 (×2): qty 0.3

## 2020-03-19 MED ORDER — PHENOL 1.4 % MT LIQD: 1.0000 | OROMUCOSAL | Status: DC | PRN

## 2020-03-19 MED ORDER — SIMETHICONE 80 MG PO CHEW
80.0000 mg | CHEWABLE_TABLET | Freq: Four times a day (QID) | ORAL | Status: DC | PRN
  Administered 2020-03-23 – 2020-04-04 (×2): 80 mg via ORAL
  Filled 2020-03-19 (×2): qty 1

## 2020-03-19 MED ORDER — PIPERACILLIN-TAZOBACTAM 3.375 G IVPB 30 MIN
3.3750 g | Freq: Once | INTRAVENOUS | Status: DC
  Filled 2020-03-19: qty 50

## 2020-03-19 MED ORDER — ACYCLOVIR 200 MG PO CAPS
400.0000 mg | ORAL_CAPSULE | Freq: Every day | ORAL | Status: DC
  Administered 2020-03-21 – 2020-04-06 (×17): 400 mg via ORAL
  Filled 2020-03-19 (×18): qty 2

## 2020-03-19 MED ORDER — DIPHENHYDRAMINE HCL 12.5 MG/5ML PO ELIX
12.5000 mg | ORAL_SOLUTION | Freq: Four times a day (QID) | ORAL | Status: DC | PRN
  Filled 2020-03-19: qty 10

## 2020-03-19 MED ORDER — OXYCODONE HCL 5 MG PO TABS
5.0000 mg | ORAL_TABLET | ORAL | Status: DC | PRN
  Administered 2020-03-19: 10 mg via ORAL
  Administered 2020-03-22 (×2): 5 mg via ORAL
  Administered 2020-03-22: 10 mg via ORAL
  Administered 2020-03-23: 5 mg via ORAL
  Administered 2020-03-23 – 2020-03-24 (×4): 10 mg via ORAL
  Filled 2020-03-19: qty 2
  Filled 2020-03-19: qty 1
  Filled 2020-03-19 (×2): qty 2
  Filled 2020-03-19 (×2): qty 1
  Filled 2020-03-19 (×3): qty 2
  Filled 2020-03-19: qty 1

## 2020-03-19 MED ORDER — TRAZODONE HCL 100 MG PO TABS
100.0000 mg | ORAL_TABLET | Freq: Every evening | ORAL | Status: DC | PRN
  Administered 2020-03-21: 100 mg via ORAL
  Filled 2020-03-19: qty 1

## 2020-03-19 MED ORDER — LIDOCAINE 5 % EX PTCH
2.0000 | MEDICATED_PATCH | CUTANEOUS | Status: DC
  Administered 2020-03-20 – 2020-04-09 (×20): 2 via TRANSDERMAL
  Filled 2020-03-19 (×22): qty 2

## 2020-03-19 MED ORDER — ACYCLOVIR 5 % EX OINT
TOPICAL_OINTMENT | Freq: Every day | CUTANEOUS | Status: DC
  Filled 2020-03-19: qty 15

## 2020-03-19 MED ORDER — INSULIN ASPART 100 UNIT/ML ~~LOC~~ SOLN
3.0000 [IU] | Freq: Three times a day (TID) | SUBCUTANEOUS | Status: DC
  Administered 2020-03-20 – 2020-04-03 (×37): 3 [IU] via SUBCUTANEOUS

## 2020-03-19 MED ORDER — BISACODYL 10 MG RE SUPP
10.0000 mg | Freq: Every day | RECTAL | Status: DC | PRN
  Filled 2020-03-19: qty 1

## 2020-03-19 MED ORDER — POLYETHYLENE GLYCOL 3350 17 G PO PACK
17.0000 g | PACK | Freq: Every day | ORAL | Status: DC
  Administered 2020-03-26: 17 g via ORAL
  Filled 2020-03-19 (×3): qty 1

## 2020-03-19 MED ORDER — PIPERACILLIN-TAZOBACTAM 3.375 G IVPB
3.3750 g | Freq: Three times a day (TID) | INTRAVENOUS | Status: DC
  Filled 2020-03-19: qty 50

## 2020-03-19 MED ORDER — SODIUM CHLORIDE 0.9 % IV SOLN: INTRAVENOUS | Status: AC

## 2020-03-19 MED ORDER — TRAMADOL HCL 50 MG PO TABS
50.0000 mg | ORAL_TABLET | Freq: Four times a day (QID) | ORAL | Status: DC
  Administered 2020-03-20 – 2020-03-21 (×5): 50 mg via ORAL
  Filled 2020-03-19 (×5): qty 1

## 2020-03-19 MED ORDER — SUCRALFATE 1 GM/10ML PO SUSP
1.0000 g | Freq: Three times a day (TID) | ORAL | Status: DC
  Administered 2020-03-20 – 2020-04-05 (×61): 1 g via ORAL
  Filled 2020-03-19 (×66): qty 10

## 2020-03-19 MED ORDER — ATORVASTATIN CALCIUM 10 MG PO TABS: 20.0000 mg | ORAL_TABLET | Freq: Every day | ORAL | Status: DC

## 2020-03-19 MED ORDER — VANCOMYCIN HCL 1250 MG/250ML IV SOLN
1250.0000 mg | Freq: Once | INTRAVENOUS | Status: DC
  Administered 2020-03-19: 1250 mg via INTRAVENOUS
  Filled 2020-03-19: qty 250

## 2020-03-19 MED ORDER — ENSURE MAX PROTEIN PO LIQD
11.0000 [oz_av] | Freq: Two times a day (BID) | ORAL | Status: DC
  Administered 2020-03-20 – 2020-03-26 (×9): 11 [oz_av] via ORAL
  Filled 2020-03-19 (×14): qty 330

## 2020-03-19 MED ORDER — CLONAZEPAM 0.125 MG PO TBDP: 0.2500 mg | ORAL_TABLET | Freq: Three times a day (TID) | ORAL | Status: DC | PRN

## 2020-03-19 MED ORDER — PIPERACILLIN-TAZOBACTAM 3.375 G IVPB
3.3750 g | Freq: Three times a day (TID) | INTRAVENOUS | Status: DC
  Filled 2020-03-19 (×2): qty 50

## 2020-03-19 MED ORDER — PROSOURCE PLUS PO LIQD
30.0000 mL | Freq: Two times a day (BID) | ORAL | Status: DC
  Administered 2020-03-21 – 2020-04-05 (×29): 30 mL via ORAL
  Filled 2020-03-19 (×36): qty 30

## 2020-03-19 MED ORDER — SORBITOL 70 % SOLN
30.0000 mL | Freq: Every day | Status: DC | PRN
  Filled 2020-03-19: qty 30

## 2020-03-19 MED ORDER — VANCOMYCIN HCL 750 MG/150ML IV SOLN
750.0000 mg | Freq: Two times a day (BID) | INTRAVENOUS | Status: DC
  Administered 2020-03-20: 750 mg via INTRAVENOUS
  Filled 2020-03-19 (×2): qty 150

## 2020-03-19 MED ORDER — CHLORPROMAZINE HCL 25 MG PO TABS
25.0000 mg | ORAL_TABLET | Freq: Four times a day (QID) | ORAL | Status: DC | PRN
  Filled 2020-03-19: qty 1

## 2020-03-19 MED ORDER — INSULIN ASPART 100 UNIT/ML ~~LOC~~ SOLN
0.0000 [IU] | Freq: Three times a day (TID) | SUBCUTANEOUS | Status: DC
  Administered 2020-03-20: 1 [IU] via SUBCUTANEOUS
  Administered 2020-03-20: 2 [IU] via SUBCUTANEOUS
  Administered 2020-03-21: 1 [IU] via SUBCUTANEOUS
  Administered 2020-03-21: 2 [IU] via SUBCUTANEOUS
  Administered 2020-03-21: 3 [IU] via SUBCUTANEOUS
  Administered 2020-03-22: 7 [IU] via SUBCUTANEOUS
  Administered 2020-03-22 (×2): 3 [IU] via SUBCUTANEOUS
  Administered 2020-03-23: 2 [IU] via SUBCUTANEOUS
  Administered 2020-03-23: 3 [IU] via SUBCUTANEOUS
  Administered 2020-03-23: 7 [IU] via SUBCUTANEOUS
  Administered 2020-03-24 (×2): 5 [IU] via SUBCUTANEOUS
  Administered 2020-03-24: 3 [IU] via SUBCUTANEOUS
  Administered 2020-03-25: 2 [IU] via SUBCUTANEOUS
  Administered 2020-03-25: 5 [IU] via SUBCUTANEOUS
  Administered 2020-03-25 – 2020-03-26 (×4): 2 [IU] via SUBCUTANEOUS
  Administered 2020-03-27 (×2): 1 [IU] via SUBCUTANEOUS
  Administered 2020-03-28: 2 [IU] via SUBCUTANEOUS
  Administered 2020-03-28: 1 [IU] via SUBCUTANEOUS
  Administered 2020-03-28: 3 [IU] via SUBCUTANEOUS
  Administered 2020-03-29: 1 [IU] via SUBCUTANEOUS
  Administered 2020-03-29 – 2020-03-30 (×2): 2 [IU] via SUBCUTANEOUS
  Administered 2020-03-30 (×2): 3 [IU] via SUBCUTANEOUS
  Administered 2020-03-31: 1 [IU] via SUBCUTANEOUS
  Administered 2020-03-31 – 2020-04-01 (×4): 3 [IU] via SUBCUTANEOUS
  Administered 2020-04-01 – 2020-04-02 (×3): 2 [IU] via SUBCUTANEOUS
  Administered 2020-04-02 – 2020-04-03 (×2): 1 [IU] via SUBCUTANEOUS
  Administered 2020-04-03: 2 [IU] via SUBCUTANEOUS
  Administered 2020-04-03: 7 [IU] via SUBCUTANEOUS
  Administered 2020-04-04: 2 [IU] via SUBCUTANEOUS
  Administered 2020-04-04: 3 [IU] via SUBCUTANEOUS
  Administered 2020-04-05 – 2020-04-06 (×4): 2 [IU] via SUBCUTANEOUS

## 2020-03-19 MED ORDER — VANCOMYCIN HCL 750 MG/150ML IV SOLN
750.0000 mg | Freq: Two times a day (BID) | INTRAVENOUS | Status: DC
  Filled 2020-03-19: qty 150

## 2020-03-19 MED ORDER — POLYETHYLENE GLYCOL 3350 17 G PO PACK: 17.0000 g | PACK | Freq: Every day | ORAL | Status: DC | PRN

## 2020-03-19 MED ORDER — CARVEDILOL 3.125 MG PO TABS
3.1250 mg | ORAL_TABLET | Freq: Two times a day (BID) | ORAL | Status: DC
  Administered 2020-03-20 – 2020-04-07 (×36): 3.125 mg via ORAL
  Filled 2020-03-19 (×36): qty 1

## 2020-03-19 MED ORDER — PROCHLORPERAZINE MALEATE 5 MG PO TABS
5.0000 mg | ORAL_TABLET | Freq: Four times a day (QID) | ORAL | Status: DC | PRN
  Filled 2020-03-19: qty 2

## 2020-03-19 MED ORDER — ONDANSETRON HCL 4 MG PO TABS: 4.0000 mg | ORAL_TABLET | Freq: Four times a day (QID) | ORAL | Status: DC | PRN

## 2020-03-19 MED ORDER — PROCHLORPERAZINE 25 MG RE SUPP
12.5000 mg | Freq: Four times a day (QID) | RECTAL | Status: DC | PRN
  Filled 2020-03-19: qty 1

## 2020-03-19 NOTE — Progress Notes (Signed)
Pharmacy Antibiotic Note  Trevor Shelton is a 54 y.o. male admitted on 8/8 with falls and newly diagnosed MM, transferred to rehab, and now readmitted with fevers. Pharmacy has been consulted for Cefepime  Plan: Cefepime 2 g IV q8h      Temp (24hrs), Avg:100.5 F (38.1 C), Min:97.6 F (36.4 C), Max:102.1 F (38.9 C)  Recent Labs  Lab 03/14/20 0611 03/14/20 0611 03/15/20 0810 03/17/20 1000 03/18/20 0847 03/18/20 0930 03/11/2020 0517 04/02/2020 1802  WBC 3.6*   < > 4.2  4.2 3.5*  --  2.7* 2.9* 4.6  CREATININE 1.57*  --  1.24 1.25* 0.91  --   --  1.06   < > = values in this interval not displayed.    Estimated Creatinine Clearance: 62.6 mL/min (by C-G formula based on SCr of 1.06 mg/dL).    No Known Allergies  Antimicrobials this admission: Vancomycin 9/11 >>  Zosyn 9/11 >> 9/11 Cefepime 9/12>>  Phillis Knack, PharmD, BCPS  03/22/2020

## 2020-03-19 NOTE — Progress Notes (Signed)
Multiple consults with MD and Rapid nurse starting earlier this afternoon. All V/S's abnormal except O2 sat. Abdomen continuing to be more distended and firmer. Labs done, IV fluids started, ATB's, ordered, and CT's ordered.

## 2020-03-19 NOTE — Progress Notes (Addendum)
RRrn called for assistance with transfer to another level of care per MD at the beginning of shift.yellow MEWS with temp 99.2 127/92 HR 126 and resp 28. RRrn and Dr. Nicoletta Dress came to see the patient. Vancomycin IV started with a change of dose rate per Dr. Nicoletta Dress 83/hr. Spanish speaking rn, Janett Billow assisted with translation, family member was also at the bedside. Covid test done prior to transfer. Report given to [place] rn.

## 2020-03-19 NOTE — Progress Notes (Signed)
   03/25/2020 1357  Assess: MEWS Score  Temp (!) 100.5 F (38.1 C)  BP 121/82  Pulse Rate (!) 107  Resp (!) 21  Level of Consciousness Alert  SpO2 96 %  O2 Device Room Air  Assess: MEWS Score  MEWS Temp 1  MEWS Systolic 0  MEWS Pulse 1  MEWS RR 1  MEWS LOC 0  MEWS Score 3  MEWS Score Color Yellow  Assess: if the MEWS score is Yellow or Red  Were vital signs taken at a resting state? Yes  Focused Assessment Change from prior assessment (see assessment flowsheet)  Early Detection of Sepsis Score *See Row Information* High  MEWS guidelines implemented *See Row Information* Yes  Treat  Pain Scale 0-10  Pain Score 6  Pain Location Sternum  Pain Orientation Medial  Pain Descriptors / Indicators Aching  Take Vital Signs  Increase Vital Sign Frequency  Yellow: Q 2hr X 2 then Q 4hr X 2, if remains yellow, continue Q 4hrs  Escalate  MEWS: Escalate Yellow: discuss with charge nurse/RN and consider discussing with provider and RRT  Notify: Charge Nurse/RN  Name of Charge Nurse/RN Notified [employer] RN   Date Charge Nurse/RN Notified 04/04/2020  Time Charge Nurse/RN Notified 1540  Notify: Provider  Provider Name/Title MD Posey Pronto  Date Provider Notified 04/05/2020  Time Provider Notified 1429  Notification Type Call  Notification Reason Change in status  Response See new orders  Date of Provider Response 03/30/2020  Time of Provider Response 1429

## 2020-03-19 NOTE — Progress Notes (Addendum)
Please see note from earlier today as well.  Later called by nursing regarding change in MEWS and medical status.  Discussed with rapid response as well.  Called Oncology - to review chart and follow up with patient tomorrow. Blood cultures, CXR, labs, urine ordered. Empiric Vanc/Zosyn started. IVF initiated   Will monitor closely.Marland Kitchen

## 2020-03-19 NOTE — H&P (Signed)
History and Physical    Trevor Shelton ZRA:[id number] DOB: 1966-01-16 DOA: 03/14/2020  PCP: Patient, No Pcp Per Patient coming from: home  Chief Complaint: generalized weakness  HPI: Trevor Shelton is a 54 y.o. male with a PMH of HTN and HLD who was transferred from inpatient rehab for fevers and abnormal labs.  Spanish interpreter at bedside.  He is fully vaccinated with further vaccines.   Patient was initially admitted to inpatient service between 8/8 and 03/03/2020.  The CT-guided bone marrow biopsy on 02/19/20 showed positive for neoplasm kappa light chain consistent with multiple myeloma. Imaging showed multiple spinal lytic lesions,  left rib fracture on ninth and 10th, cord compression. He finished 10 sessions of radiation therapy. Currently on Decadron for cord compression.   Patient was discharged to inpatient rehab on 03/03/2020.  He underwent chemotherapy on 03/15/2020 and next chemotherapy is scheduled for 03/22/2020.  Patient had significant nosebleed on 03/17/2020.  He received 2 units PRBCs on 03/18/2020 due to hemoglobin 6.8.  Lovenox was stopped.  Patient spiked fevers with T-max 102 this afternoon. A CT chest/abdmen/pelvis was ordered but not done yet. His labs showed Trevor Shelton 114, CL 86, CO2 19, BUN 47, Cr 1.06, WBC 4.6 (was 2.9 on 9/10), hemoglobin 10, PLT 46 which is at his baseline, nonrevealing UA.  Covid is pending.  CXR showed Areas of patchy atelectasis bilaterally. KUB showed stool burden.  Patient was empirically started on vancomycin and Zosyn at inpatient rehab.  [facility] is admitted to [facility] now.    Review of Systems: As per HPI otherwise 10 point review of systems negative.  Review of Systems Otherwise negative except as per HPI, including: General: Positive for fevers and chills and fatigue Resp: Denies cough, wheezing, shortness of breath. Cardiac: Denies palpitations, orthopnea, paroxysmal nocturnal dyspnea.  Positive for pleuritic chest pain with deep breathing GI:  Denies abdominal pain, nausea, vomiting, diarrhea or constipation GU: Denies dysuria, frequency, hesitancy or incontinence MS: Denies muscle aches, joint pain or swelling Neuro: Denies headache, neurologic deficits (numbness, tingling).  Positive for known bilateral lower extremity weakness.  Positive for generalized weakness Psych: Denies anxiety, depression, SI/HI/AVH Skin: Denies new rashes or lesions ID: Denies sick contacts, exotic exposures, travel  Past Medical History:  Diagnosis Date  . AKI (acute kidney injury) ([place]) 02/2020  . Hypercalcemia 02/2020  . Hypercholesteremia   . Hypertension     Past Surgical History:  Procedure Laterality Date  . HERNIA REPAIR    . IR FLUORO GUIDE CV LINE RIGHT  02/17/2020  . IR REMOVAL TUN CV CATH W/O FL  03/01/2020  . IR US GUIDE VASC ACCESS RIGHT  02/17/2020    SOCIAL HISTORY:  reports that he has never smoked. He has never used smokeless tobacco. He reports previous alcohol use. He reports previous drug use.  No Known Allergies  FAMILY HISTORY: Family History  Problem Relation Age of Onset  . Alzheimer's disease Mother   . Diabetes Father   . Diabetes Brother      Prior to Admission medications   Medication Sig Start Date End Date Taking? Authorizing Provider  amLODipine (NORVASC) 10 MG tablet Take 1 tablet (10 mg total) by mouth daily. 03/04/20   Shelly Coss, MD  carvedilol (COREG) 3.125 MG tablet Take 1 tablet (3.125 mg total) by mouth 2 (two) times daily with a meal. 03/03/20   Shelly Coss, MD  dexamethasone (DECADRON) 4 MG tablet Take 1 tablet (4 mg total) by mouth 3 (three) times daily. 03/03/20 03/03/21  Shelly Coss, MD  feeding supplement, ENSURE ENLIVE, (ENSURE ENLIVE) LIQD Take 237 mLs by mouth 3 (three) times daily between meals. 03/03/20   Shelly Coss, MD  insulin aspart (NOVOLOG) 100 UNIT/ML injection Inject 0-9 Units into the skin 3 (three) times daily with meals. 03/03/20   Shelly Coss, MD  insulin  aspart (NOVOLOG) 100 UNIT/ML injection Inject 3 Units into the skin 3 (three) times daily with meals. 03/03/20   Shelly Coss, MD  insulin glargine (LANTUS) 100 UNIT/ML injection Inject 0.1 mLs (10 Units total) into the skin at bedtime. 03/03/20   Shelly Coss, MD  lidocaine (LIDODERM) 5 % Place 1 patch onto the skin daily. Remove & Discard patch within 12 hours or as directed by MD 03/03/20   Shelly Coss, MD  lidocaine (XYLOCAINE) 2 % solution Use as directed 15 mLs in the mouth or throat every 6 (six) hours as needed for mouth pain. 03/03/20   Shelly Coss, MD  oxyCODONE (OXY IR/ROXICODONE) 5 MG immediate release tablet Take 1 tablet (5 mg total) by mouth every 4 (four) hours as needed for moderate pain. 03/03/20   Shelly Coss, MD  pantoprazole (PROTONIX) 40 MG tablet Take 1 tablet (40 mg total) by mouth daily. 03/04/20   Shelly Coss, MD  polyethylene glycol (MIRALAX / GLYCOLAX) 17 g packet Take 17 g by mouth daily as needed for moderate constipation. 03/03/20   Shelly Coss, MD  simvastatin (ZOCOR) 20 MG tablet Take 20 mg by mouth daily. 02/01/20   [provider]    Physical Exam: Vitals:   03/23/2020 2100 03/16/2020 2300  BP: 117/70 139/87  Pulse: (!) 112 (!) 123  Resp: (!) 22 (!) 22  Temp: (!) 102.1 F (38.9 C) (!) 100.9 F (38.3 C)  TempSrc: Oral Oral  SpO2: 97% 97%      Constitutional: NAD, calm, comfortable Eyes: PERRL, lids and conjunctivae normal ENMT: Mucous membranes are moist. Posterior pharynx clear of any exudate or lesions.Normal dentition.  Neck: normal, supple, no masses, no thyromegaly Respiratory: clear to auscultation bilaterally, no wheezing, no crackles. Normal respiratory effort. No accessory muscle use.  Cardiovascular: Regular rate and rhythm, no murmurs / rubs / gallops. No extremity edema. 2+ pedal pulses. No carotid bruits.  Abdomen: no tenderness, no masses palpated. No hepatosplenomegaly. Bowel sounds positive.  Musculoskeletal: no  clubbing / cyanosis. No joint deformity upper and lower extremities. Good ROM, no contractures. Normal muscle tone.  Skin: no rashes, lesions, ulcers. No induration Neurologic: CN 2-12 grossly intact. Sensation intact, DTR normal. Strength 5/5 in all 4.  Psychiatric: Normal judgment and insight. Alert and oriented x 3. Normal mood.     Labs on Admission: I have personally reviewed following labs and imaging studies  CBC: Recent Labs  Lab 03/14/20 0611 03/14/20 0611 03/15/20 0810 03/17/20 1000 03/18/20 0930 03/24/2020 0517 03/09/2020 1802  WBC 3.6*   < > 4.2  4.2 3.5* 2.7* 2.9* 4.6  NEUTROABS 2.9  --  3.4 2.6 2.1 2.1  --   HGB 8.1*   < > 8.0*  8.1* 7.7* 6.8* 11.1* 10.0*  HCT 23.0*   < > 23.1*  23.2* 22.4* 19.7* 31.5* 28.8*  MCV 84.2   < > 83.7  84.4 86.5 83.5 84.7 83.0  PLT 54*   < > PLATELET CLUMPS NOTED ON SMEAR, UNABLE TO ESTIMATE  76* 75* 49* 47* 46*   < > = values in this interval not displayed.   Basic Metabolic Panel: Recent Labs  Lab 03/14/20 0611 03/15/20  2542 03/17/20 1000 03/18/20 0847 03/17/2020 1802  Trevor Shelton 121* 122* 127* 123* 114*  K 4.7 4.3 4.2 4.1 4.6  CL 93* 94* 102 93* 86*  CO2 19* 18* 15* 19* 19*  GLUCOSE 299* 298* 228* 159* 227*  BUN 46* 40* 30* 33* 47*  CREATININE 1.57* 1.24 1.25* 0.91 1.06  CALCIUM 7.8* 7.9* 8.1* 8.3* 8.3*   GFR: Estimated Creatinine Clearance: 62.6 mL/min (by C-G formula based on SCr of 1.06 mg/dL). Liver Function Tests: Recent Labs  Lab 03/14/20 0611 03/24/2020 1802  AST 36 38  ALT 30 30  ALKPHOS 88 94  BILITOT 0.6 0.6  PROT 6.5 6.3*  ALBUMIN 2.1* 1.9*   No results for input(s): LIPASE, AMYLASE in the last 168 hours. No results for input(s): AMMONIA in the last 168 hours. Coagulation Profile: No results for input(s): INR, PROTIME in the last 168 hours. Cardiac Enzymes: No results for input(s): CKTOTAL, CKMB, CKMBINDEX, TROPONINI in the last 168 hours. BNP (last 3 results) No results for input(s): PROBNP in the last 8760  hours. HbA1C: No results for input(s): HGBA1C in the last 72 hours. CBG: Recent Labs  Lab 03/18/20 2114 03/11/2020 0605 03/20/2020 1143 04/07/2020 1649 03/26/2020 2222  GLUCAP 180* 124* 188* 209* 228*   Lipid Profile: No results for input(s): CHOL, HDL, LDLCALC, TRIG, CHOLHDL, LDLDIRECT in the last 72 hours. Thyroid Function Tests: No results for input(s): TSH, T4TOTAL, FREET4, T3FREE, THYROIDAB in the last 72 hours. Anemia Panel: No results for input(s): VITAMINB12, FOLATE, FERRITIN, TIBC, IRON, RETICCTPCT in the last 72 hours. Urine analysis:    Component Value Date/Time   COLORURINE YELLOW 03/14/2020 1700   APPEARANCEUR CLEAR 03/20/2020 1700   LABSPEC 1.016 03/24/2020 1700   PHURINE 5.0 03/23/2020 1700   GLUCOSEU 50 (A) 03/15/2020 1700   HGBUR NEGATIVE 03/18/2020 1700   BILIRUBINUR NEGATIVE 03/11/2020 1700   KETONESUR NEGATIVE 03/16/2020 1700   PROTEINUR NEGATIVE 03/28/2020 1700   NITRITE NEGATIVE 03/11/2020 1700   LEUKOCYTESUR NEGATIVE 03/30/2020 1700   Sepsis Labs: !!!!!!!!!!!!!!!!!!!!!!!!!!!!!!!!!!!!!!!!!!!! _0 (procalcitonin:4,lacticidven:4) ) Recent Results (from the past 240 hour(s))  SARS Coronavirus 2 by RT PCR (hospital order, performed in [facility] hospital lab) Nasopharyngeal Nasopharyngeal Swab     Status: None   Collection Time: 03/24/2020  8:37 PM   Specimen: Nasopharyngeal Swab  Result Value Ref Range Status   SARS Coronavirus 2 NEGATIVE NEGATIVE Final    Comment: (NOTE) SARS-CoV-2 target nucleic acids are NOT DETECTED.  The SARS-CoV-2 RNA is generally detectable in upper and lower respiratory specimens during the acute phase of infection. The lowest concentration of SARS-CoV-2 viral copies this assay can detect is 250 copies / mL. A negative result does not preclude SARS-CoV-2 infection and should not be used as the sole basis for treatment or other patient management decisions.  A negative result may occur with improper specimen collection /  handling, submission of specimen other than nasopharyngeal swab, presence of viral mutation(s) within the areas targeted by this assay, and inadequate number of viral copies (<250 copies / mL). A negative result must be combined with clinical observations, patient history, and epidemiological information.  Fact Sheet for Patients:   StrictlyIdeas.no  Fact Sheet for Healthcare Providers: BankingDealers.co.za  This test is not yet approved or  cleared by the [place] FDA and has been authorized for detection and/or diagnosis of SARS-CoV-2 by FDA under an Emergency Use Authorization (EUA).  This EUA will remain in effect (meaning this test can be used) for the duration of the COVID-19  declaration under Section 564(b)(1) of the Act, 21 U.S.C. section 360bbb-3(b)(1), unless the authorization is terminated or revoked sooner.  Performed at [facility], [place] [address] [place], [place] [address]      Radiological Exams on Admission: DG Chest 2 View  Result Date: 03/27/2020 CLINICAL DATA:  Shortness of breath and fever EXAM: CHEST - 2 VIEW COMPARISON:  February 25, 2020 FINDINGS: There is mild atelectatic change in the left mid lung and bibasilar regions. There is no edema or airspace opacity. Heart is upper normal in size with pulmonary vascularity normal. No adenopathy. No bone lesions. IMPRESSION: Areas of patchy atelectasis bilaterally. No edema or airspace opacity. Cardiac silhouette within normal limits. Electronically Signed   By: Lowella Grip III M.D.   On: 03/11/2020 15:21   DG Abd Portable 1V  Result Date: 03/16/2020 CLINICAL DATA:  Fever EXAM: X-RAY ABDOMEN 1 VIEW COMPARISON:  03/13/2020 FINDINGS: The bowel gas pattern is nonspecific with gaseous distention of loops of small bowel and colon scattered throughout the abdomen. There is an above average amount of stool throughout the ascending colon. There is no definite  pneumatosis or free air. No radiopaque kidney stones. IMPRESSION: 1. Nonspecific bowel gas pattern with gaseous distention of loops of small bowel and colon scattered throughout the abdomen. 2. Above average amount of stool throughout the ascending colon. This is not significantly changed from 03/13/2020. Electronically Signed   By: Constance Holster M.D.   On: 03/18/2020 17:46     All images have been reviewed by me personally.    Assessment/Plan Active Problems:   AKI (acute kidney injury) ([place])   Pathologic fracture   Hyponatremia   Kappa light chain myeloma (H[facility])   Diabetes mellitus ([place])   Hypertension   Constipation   Incomplete paraplegia ([facility])   Other pancytopenia ([facility]CC)   Fever and neutropenia ([facility])   AMS (altered mental status)   # fevers  # recently diagnosed MM with multiple lytic lesions throughout the skeleton #Recently initiation of chemotherapy #On Decadron #s/p completion of 10 sessions of radiation therapy  Patient spiked fevers today with T-max 102F. COVID is negative. (Is fully vaccinated).  UA is nonrevealing.  CXR and KUB were nonspecific. PAN CT is pending.   Interestingly, his WBC was 2.9 yesterday and 4.6 today, which I think represents relative leukocytosis to him.  We will obtain all infectious work-up and empirically cover him with broad-spectrum antibiotics for now.    -Admit to progressive unit -Telemetry monitoring -Blood cultures pending -Covid-19 was negative -UA is nonrevealing -Respiratory virus panel pending -CT chest/abdomen/pelvis pending -Antibiotics-empiric coverage with vancomycin and cefepime -MRSA PCR is pending -Continue Decadron per oncologist instruction.  Please refer to their note -Oncology has been following him up while he is an inpatient rehab.  We will contact oncology in the morning to let them know patient is transferred to inpatient.   # AMS  # Significant hyponatremia # AKI #Short-term hemodialysis requirement in  August 2021  Patient is alert and oriented to self, person and place.  Disoriented to time during my examination. Trevor Shelton is 114 today  -Patient had hyponatremia during his hospital admission with Christalynn Boise level 123-127. His discharge Trevor Shelton was 123 on 03/03/20. His Koda Defrank has been fluctuating between 121-127 while he was at inpatient rehab with the last sodium 123 on 03/18/2020.   - Trevor Shelton 114 today -We will obtain serum osmolality, urine osmolality and urine sodium level -We will start gentle NS infusion at 77m/h with close BMP  Q4h. The goal of correction is 4-6 Meg next 24 hours and no more than 8 Meg.  - head CT pending given low PLT   #Recent significant nosebleed on 03/17/2020 #Pancytopenia  - s/p 2 units PRBCs on 03/18/2020 due to hemoglobin of 6.8 -Pancytopenia has been followed by oncologist    # know Pathological fractures  Secondary to multiple myeloma. Continue supportive care, pain control.   # T2DM, newly diagnosed in August 2021  -He will A1c was 6.5 -Glucose monitoring and insulin   #Chronic constipation -scheduled bowel regimen     # HTN and HLD  -Chronic and stable.  Blood pressure borderline low so I will stop home amlodipine.  Continue home Coreg       There is no height or weight on file to calculate BMI. Pressure Injury 03/16/20 Coccyx Mid;Right Stage 2 -  Partial thickness loss of dermis presenting as a shallow open injury with a red, pink wound bed without slough. (Active)  03/16/20 1324  Location: Coccyx  Location Orientation: Mid;Right  Staging: Stage 2 -  Partial thickness loss of dermis presenting as a shallow open injury with a red, pink wound bed without slough.  Wound Description (Comments):   Present on Admission:      Pressure Injury 03/16/20 Buttocks Left;Medial Stage 2 -  Partial thickness loss of dermis presenting as a shallow open injury with a red, pink wound bed without slough. OPEN BLISTER (Active)  03/16/20 2200  Location: Buttocks  Location Orientation:  Left;Medial  Staging: Stage 2 -  Partial thickness loss of dermis presenting as a shallow open injury with a red, pink wound bed without slough.  Wound Description (Comments): OPEN BLISTER  Present on Admission: No        DVT prophylaxis: SCD Code Status: Full code Family Communication: sister at bedside Consults called: none Admission status: inpatient progressive unit  Status is: Inpatient  Remains inpatient appropriate because:Hemodynamically unstable   Dispo: The patient is from: Home              Anticipated d/c is to: Home              Anticipated d/c date is: > 3 days              Patient currently is not medically stable to d/c.       Time Spent: 65 minutes.  >50% of the time was devoted to discussing the patients care, assessment, plan and disposition with other care givers along with counseling the patient about the risks and benefits of treatment.    Charlann Lange MD [facility]  If 7PM-7AM, please contact night-coverage   03/20/2020, 12:20 AM

## 2020-03-19 NOTE — Progress Notes (Signed)
Pharmacy Antibiotic Note  Trevor Shelton is a 54 y.o. male admitted on 03/03/2020 with falls and newly diagnosed MM. Pharmacy has been consulted for vancomycin and Zosyn dosing with fevers. SCr has been somewhat labile, mostly ~1-1.2, most recent 0.91 mg/dl on 9/10.  Plan: Zosyn 3.375g IV EI q8h Vancomycin 1250mg  IV x1 then 750mg  IV q12h Follow Cr, cultures  Height: 5\' 3"  (160 cm) Weight:  (unable to get weight, bed inaccurate) IBW/kg (Calculated) : 56.9  Temp (24hrs), Avg:99.4 F (37.4 C), Min:97.6 F (36.4 C), Max:102 F (38.9 C)  Recent Labs  Lab 03/14/20 0611 03/15/20 0810 03/17/20 1000 03/18/20 0847 03/18/20 0930 03/20/2020 0517  WBC 3.6* 4.2   4.2 3.5*  --  2.7* 2.9*  CREATININE 1.57* 1.24 1.25* 0.91  --   --     Estimated Creatinine Clearance: 72.9 mL/min (by C-G formula based on SCr of 0.91 mg/dL).    No Known Allergies  Antimicrobials this admission: Vancomycin 9/11 >>  Zosyn 9/11 >>   Dose adjustments this admission: none  Microbiology results: sent  Thank you for allowing pharmacy to be a part of this patients care.  Arrie Senate, PharmD, BCPS Clinical Pharmacist [phone] Please check AMION for all [facility] numbers 03/12/2020

## 2020-03-19 NOTE — Progress Notes (Signed)
PHYSICAL MEDICINE & REHABILITATION PROGRESS NOTE   Subjective/Complaints: Patient seen sitting up in bed this morning.  He states he slept well overnight.  He looks calm this morning.  He was seen by oncology yesterday, notes reviewed-steroids started.  He was transfused 2 units PRBC yesterday with improvement.  ROS: Denies CP, SOB, N/V/D  Objective:   No results found. Recent Labs    03/18/20 0930 03/15/2020 0517  WBC 2.7* 2.9*  HGB 6.8* 11.1*  HCT 19.7* 31.5*  PLT 49* 47*   Recent Labs    03/17/20 1000 03/18/20 0847  NA 127* 123*  K 4.2 4.1  CL 102 93*  CO2 15* 19*  GLUCOSE 228* 159*  BUN 30* 33*  CREATININE 1.25* 0.91  CALCIUM 8.1* 8.3*    Intake/Output Summary (Last 24 hours) at 03/29/2020 0956 Last data filed at 03/15/2020 0700 Gross per 24 hour  Intake 1318 ml  Output 830 ml  Net 488 ml     Physical Exam: Vital Signs Blood pressure (!) 144/87, pulse (!) 109, temperature 97.6 F (36.4 C), resp. rate 18, height 5' 3" (1.6 m), weight 57.4 kg, SpO2 98 %. Constitutional: No distress . Vital signs reviewed. HENT: Normocephalic.  Atraumatic. Eyes: EOMI. No discharge. Cardiovascular: No JVD.  RRR. Respiratory: Normal effort.  No stridor.  Bilateral clear to auscultation. GI: Non-distended.  BS +. Skin: Warm and dry.  Intact on visible skin. Psych: Normal mood.  Normal behavior. Musc: No edema in extremities.  No tenderness in distal extremities. Neuro: Alert Motor: Bilateral upper extremities: 4+/5 proximal distal Bilateral lower extremities: Grossly hip flexion, knee extension plantar dorsiflexion 3-3/5, left weaker than right  Assessment/Plan: 1. 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  Physiatrist is providing close team supervision and 24 hour management of active medical problems  listed below.  Physiatrist and rehab team continue to assess barriers to discharge/monitor patient progress toward functional and medical goals  Care Tool:  Bathing    Body parts bathed by patient: Right arm, Left arm, Chest, Abdomen, Front perineal area, Right upper leg, Left upper leg, Face   Body parts bathed by helper: Buttocks, Right lower leg, Left lower leg     Bathing assist Assist Level: Moderate Assistance - Patient 50 - 74%     Upper Body Dressing/Undressing Upper body dressing   What is the patient wearing?: Pull over shirt    Upper body assist Assist Level: Set up assist    Lower Body Dressing/Undressing Lower body dressing      What is the patient wearing?: Pants     Lower body assist Assist for lower body dressing: Maximal Assistance - Patient 25 - 49%     Toileting Toileting    Toileting assist Assist for toileting: Maximal Assistance - Patient 25 - 49% Assistive Device Comment: urinal   Transfers Chair/bed transfer  Transfers assist     Chair/bed transfer assist level: Moderate Assistance - Patient 50 - 74%     Locomotion Ambulation   Ambulation assist   Ambulation activity did not occur: Safety/medical concerns          Walk 10 feet activity   Assist  Walk 10 feet activity did not occur: Safety/medical concerns        Walk 50 feet activity   Assist Walk 50 feet with 2 turns activity did not occur: Safety/medical  concerns         Walk 150 feet activity   Assist Walk 150 feet activity did not occur: Safety/medical concerns         Walk 10 feet on uneven surface  activity   Assist Walk 10 feet on uneven surfaces activity did not occur: Safety/medical concerns         Wheelchair     Assist Will patient use wheelchair at discharge?: Yes Type of Wheelchair: Manual    Wheelchair assist level: Independent Max wheelchair distance: 150'    Wheelchair 50 feet with 2 turns activity    Assist         Assist Level: Independent   Wheelchair 150 feet activity     Assist      Assist Level: Independent   Blood pressure (!) 144/87, pulse (!) 109, temperature 97.6 F (36.4 C), resp. rate 18, height 5' 3" (1.6 m), weight 57.4 kg, SpO2 98 %.   Medical Problem List and Plan: 1.  T6 (likely) incomplete ASIA C paraplegia secondary to multiple myeloma and cord compression  Continue CIR 2.  Antithrombotics: -DVT/anticoagulation:  Pharmaceutical:   8/27- at high risk for DVT due to SCI and cancer being treated  9/7- will hold Lovenox due to plts of 54k             --BLE dopplers negative for DVT             -antiplatelet therapy: N/a 3. Pain Management: Continue oxycodone --increased to 10 mg q 4 hrs prn--d/ced IV dilaudid.  Added lidocaine patch to R shoulder   Controlled with meds on 9/11 4. Mood: LCSW to follow for evaluation and support.              -antipsychotic agents: N/a 5. Neuropsych: This patient is capable of making decisions on his own behalf. 6. Skin/Wound Care: Routine pressure relief measures.  7. Fluids/Electrolytes/Nutrition: Strict I/O.  8. Acute renal failure:   Creatinine 0.91 on 9/10, improving, labs ordered for Monday 9. Hyponatremia: Recurrent hyponatremia.    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   Sodium 123 on 9/10, labs ordered for Monday  10. New diagnosis T2DM, now confounded by steroids: Hgb A1c-6.5. BS poorly controlled due to steriods as well as Ensure tid between meals. changed ensure to Ensure Max -NovoLog 3 units meal coverage tid as well as SSI for elevated BS.    CBG (last 3)  Recent Labs    03/18/20 1609 03/18/20 2114 03/26/2020 0605  GLUCAP 239* 180* 124*   Lantus increased to 30 on 9/8  Elevated,?  Improving on 9/11 11. Constipation:   Improving overall 12. Mild BPH: will monitor voiding  13. Kappa light chain MM: Next chemo planned for 9/14 14. Pathologic Fx T6 and  T9:  Treated with XRT 10/10 and high dose steroids.  15. L foot drop- get prevalon boot on L at night  PRAFO ordered  16. Hyperkalemia  Potassium 4.1 on 9/10, labs ordered for Monday 17. Hiccups-  On Thorazine, added baclofen 10 mg TID prn for hiccups, changed to QID and schedule it- also asked nursing to give Thorazine when notices hiccups, if due  Improved 18. Thrombocytopenia  Platelets 47 on 9/10, will hold Lovenox again  Labs ordered for Monday 19. Dizziness  Increased midodrine to 7.5 mg BID with breakfast and lunch 20. N/V-  9/2- could be chemo- could be meds-added Zofran prn ; con't compazine;  and alternate- ask nursing to give. 21. GERD  Increased PPI to BID 22.  Sleep disturbance  9/6-  Hold remeron as noted above. Continue trazodone prn  Changed seroquel and increased to 100 mg and con't trazodone 100 mg QHS prn if cannot sleep 23. Nose bleeds- c/o difficulty breathing  9/5-  Afrin prn and saline nasal spray.   9/7- likely due to low plts  9/8- has scabs in nose that I can see- pt asking me to call ENT since having difficulty "breathing"  9/9- suggested nasal saline spray frequently  ?  Improving 24. Throat pain  9/8- Diflucan to try and see has Ritta Slot- will treat as if has thrush  ?  Improving 25. Skin breakdown on buttocks  9/9- raw spots x3 on buttocks- ordered air mattress and to turn q2 hours. 26.  Pancytopenia  11.1 on 9/11, after transfusion 2 units PRBC on 9/10, labs ordered for Monday  WBCs 2.9 on 9/11, labs ordered for Monday  See #18  Appreciate Heme/Onc recs's  LOS: 16 days A FACE TO FACE EVALUATION WAS PERFORMED   Lorie Phenix 03/20/2020, 9:56 AM

## 2020-03-19 NOTE — Progress Notes (Signed)
Not a rapid response event:  I was notified regarding pt needing to transfer service for readmission to inpatient services. Nursing staff has contacted [place] and primary svc multiple times today regarding clinical change in Trevor Shelton. Pt seen by Leslye Peer earlier today (see note). Dr. Posey Pronto did not come back to see patient after clinical change communicated. [facility] MD consulted for readmission to inpatient services. Dr. Nicoletta Dress at bedside now for patient transfer. Pt being worked up for possible sepsis with unknown source. Pt will be transferred to PCU (6E30).  1923-99.64F, HR 126, 127/92 (103), RR 28 with sats 96% on RA

## 2020-03-19 NOTE — Progress Notes (Signed)
Physical Therapy Session Note  Patient Details  Name: Trevor Shelton MRN: [id number] Date of Birth: July 18, 1965  Today's Date: 03/24/2020 PT Individual Time: 0800-0900 PT Individual Time Calculation (min): 60 min   Short Term Goals:  Week 3:  PT Short Term Goal 1 (Week 3): Pt will ascend/descend ramp w/wc w/min assist PT Short Term Goal 2 (Week 3): Pt will maintain static stand in stedy w/midline posture maintained x 30sec PT Short Term Goal 3 (Week 3): pt will roll independently using momentum strategy PT Short Term Goal 4 (Week 3): pt will maintain sitting balance for seated activity for 5 mins without LOB with BLE supported at CGA PT Short Term Goal 5 (Week 3): pt will be able to verbalize and demonstrate pressure relief with sitting and lying positions and recall schedule I  Skilled Therapeutic Interventions/Progress Updates:   Per RN and MD, pt has been cleared to therapies following 2 unit RBC transfusion on 9/10, with increase in Hemaglobin from 6.8 to 11. Pt received sitting in recliner and agreeable to PT. PT assisted pt to don pants and shirt from recliner. Sit<>stand in steady with mod assist from [facility] and recliner height throughout session x 4. PT required to pull pants to waist and don shoes total A for safety. Pt donned shirt with set up only.   Seated LE/UE therex:  Hip adduction x 12 with 3 sec hold Hip abduction with level 2 tband  X 10 Ankle DF with AROM on the R and AAROM on the L x 15.  LAQ x 12 with cues for decreased speed Bicep curl with 3# bar weight  Chest press with 3# bar weight,  Overhead press with unweighted ball.  All UE therex performed x 10 with cues for decreased speed and improved posture throughout. Mild extensor tone in BLE noted intermittently throughout UE/LE therex.   WC mobility through hall and rehab gym x 125f x 2 as well as obstacle navigation to weave through 8 cones x 2. With cues for turning timing to prevent hitting obstacles on the L.    Sitting balance to perform lateral reach slightly outside BOS to place clothes pins on horizontal pipes x 8 BUE.   Patient returned to room and performed stedy trasnfer to recliner with mod assist. Pt left sitting in recliner with call bell in reach and all needs met.        Therapy Documentation Precautions:  Precautions Precautions: Back, Fall (per chart review, no specific back precautions however encouraged per pt comfort) Precaution Comments: Significant BLE weakness and ataxia-like movements (LLE>RLE); back and rib precautions for comfort due to fxs Restrictions Weight Bearing Restrictions: No    Vital Signs: PT assessed VS in sitting 127/79, HR 112, SpO2 100%,  Standing in stedy: 141/109, HR 129, SpO2 100% Returned to sitting: 126/75, HR 112, SpO2 100% Pain: Pain Assessment Pain Scale: 0-10 Pain Score: 7  Pain Location: Chest Pain Orientation: Left;Posterior    Therapy/Group: Individual Therapy  ALorie Phenix9/05/2020, 9:05 AM

## 2020-03-19 NOTE — Significant Event (Signed)
Rapid Response Event Note   Reason for Call :  Red MEWs score and change in overall patient status  Initial Focused Assessment:  Received a call from patient's RN stating that his patient had a temp of 102, BP 160/91, Pulse 134, and Resp 24.  He also stated that the patient appeared to be more anxious than he was yesterday.  He also stated that the patient has be drinking more and more water.  When I assessed the patient he stated that he had a little pain in his sternum, and the pain was worse on inspiration.  Negative homans test and his legs were not swollen, but they were warm.  Patient was calm during my exam and answered all questions appropriately.     Interventions:  Vitals taken, MD made aware of concerns, orders for CBC blood cultures, CTA, .45 NS at 50 cc, and vanc and zosyn ordered.    Plan of Care:  Awaiting lab results and CTA   Event Summary:   MD Notified: attending MD Call Time: 1649 Arrival Time: 9767 End Time: [place], RN

## 2020-03-20 ENCOUNTER — Inpatient Hospital Stay (HOSPITAL_COMMUNITY): Payer: Self-pay

## 2020-03-20 ENCOUNTER — Other Ambulatory Visit (HOSPITAL_COMMUNITY): Payer: Self-pay

## 2020-03-20 ENCOUNTER — Encounter (HOSPITAL_COMMUNITY): Payer: Self-pay | Admitting: Internal Medicine

## 2020-03-20 DIAGNOSIS — E0969 Drug or chemical induced diabetes mellitus with other specified complication: Secondary | ICD-10-CM

## 2020-03-20 DIAGNOSIS — N179 Acute kidney failure, unspecified: Secondary | ICD-10-CM

## 2020-03-20 DIAGNOSIS — R4182 Altered mental status, unspecified: Secondary | ICD-10-CM

## 2020-03-20 DIAGNOSIS — I611 Nontraumatic intracerebral hemorrhage in hemisphere, cortical: Secondary | ICD-10-CM

## 2020-03-20 DIAGNOSIS — D61818 Other pancytopenia: Secondary | ICD-10-CM

## 2020-03-20 DIAGNOSIS — I10 Essential (primary) hypertension: Secondary | ICD-10-CM

## 2020-03-20 DIAGNOSIS — D709 Neutropenia, unspecified: Secondary | ICD-10-CM

## 2020-03-20 DIAGNOSIS — B9561 Methicillin susceptible Staphylococcus aureus infection as the cause of diseases classified elsewhere: Secondary | ICD-10-CM | POA: Diagnosis present

## 2020-03-20 DIAGNOSIS — I619 Nontraumatic intracerebral hemorrhage, unspecified: Secondary | ICD-10-CM | POA: Diagnosis present

## 2020-03-20 DIAGNOSIS — K5900 Constipation, unspecified: Secondary | ICD-10-CM

## 2020-03-20 DIAGNOSIS — R5081 Fever presenting with conditions classified elsewhere: Secondary | ICD-10-CM

## 2020-03-20 DIAGNOSIS — R404 Transient alteration of awareness: Secondary | ICD-10-CM

## 2020-03-20 DIAGNOSIS — C9000 Multiple myeloma not having achieved remission: Secondary | ICD-10-CM

## 2020-03-20 DIAGNOSIS — E871 Hypo-osmolality and hyponatremia: Secondary | ICD-10-CM

## 2020-03-20 LAB — BASIC METABOLIC PANEL
Anion gap: 10 (ref 5–15)
Anion gap: 11 (ref 5–15)
Anion gap: 11 (ref 5–15)
Anion gap: 11 (ref 5–15)
Anion gap: 10 (ref 5–15)
BUN: 50 mg/dL — ABNORMAL HIGH (ref 6–20)
BUN: 50 mg/dL — ABNORMAL HIGH (ref 6–20)
BUN: 49 mg/dL — ABNORMAL HIGH (ref 6–20)
BUN: 48 mg/dL — ABNORMAL HIGH (ref 6–20)
BUN: 48 mg/dL — ABNORMAL HIGH (ref 6–20)
CO2: 17 mmol/L — ABNORMAL LOW (ref 22–32)
CO2: 16 mmol/L — ABNORMAL LOW (ref 22–32)
CO2: 17 mmol/L — ABNORMAL LOW (ref 22–32)
CO2: 16 mmol/L — ABNORMAL LOW (ref 22–32)
CO2: 15 mmol/L — ABNORMAL LOW (ref 22–32)
Calcium: 7.9 mg/dL — ABNORMAL LOW (ref 8.9–10.3)
Calcium: 8.1 mg/dL — ABNORMAL LOW (ref 8.9–10.3)
Calcium: 7.9 mg/dL — ABNORMAL LOW (ref 8.9–10.3)
Calcium: 7.9 mg/dL — ABNORMAL LOW (ref 8.9–10.3)
Calcium: 7.6 mg/dL — ABNORMAL LOW (ref 8.9–10.3)
Chloride: 87 mmol/L — ABNORMAL LOW (ref 98–111)
Chloride: 93 mmol/L — ABNORMAL LOW (ref 98–111)
Chloride: 93 mmol/L — ABNORMAL LOW (ref 98–111)
Chloride: 95 mmol/L — ABNORMAL LOW (ref 98–111)
Chloride: 96 mmol/L — ABNORMAL LOW (ref 98–111)
Creatinine, Ser: 1.14 mg/dL (ref 0.61–1.24)
Creatinine, Ser: 1.27 mg/dL — ABNORMAL HIGH (ref 0.61–1.24)
Creatinine, Ser: 1.24 mg/dL (ref 0.61–1.24)
Creatinine, Ser: 1.18 mg/dL (ref 0.61–1.24)
Creatinine, Ser: 1.48 mg/dL — ABNORMAL HIGH (ref 0.61–1.24)
GFR calc Af Amer: >60 mL/min (ref >60)
GFR calc Af Amer: >60 mL/min (ref >60)
GFR calc Af Amer: >60 mL/min (ref >60)
GFR calc Af Amer: >60 mL/min (ref >60)
GFR calc Af Amer: >60 mL/min (ref >60)
GFR calc non Af Amer: >60 mL/min (ref >60)
GFR calc non Af Amer: >60 mL/min (ref >60)
GFR calc non Af Amer: >60 mL/min (ref >60)
GFR calc non Af Amer: >60 mL/min (ref >60)
GFR calc non Af Amer: 52 mL/min — ABNORMAL LOW (ref >60)
Glucose, Bld: 213 mg/dL — ABNORMAL HIGH (ref 70–99)
Glucose, Bld: 158 mg/dL — ABNORMAL HIGH (ref 70–99)
Glucose, Bld: 132 mg/dL — ABNORMAL HIGH (ref 70–99)
Glucose, Bld: 94 mg/dL (ref 70–99)
Glucose, Bld: 202 mg/dL — ABNORMAL HIGH (ref 70–99)
Potassium: 4 mmol/L (ref 3.5–5.1)
Potassium: 3.8 mmol/L (ref 3.5–5.1)
Potassium: 3.7 mmol/L (ref 3.5–5.1)
Potassium: 3.5 mmol/L (ref 3.5–5.1)
Potassium: 3.8 mmol/L (ref 3.5–5.1)
Sodium: 114 mmol/L — CL (ref 135–145)
Sodium: 120 mmol/L — ABNORMAL LOW (ref 135–145)
Sodium: 121 mmol/L — ABNORMAL LOW (ref 135–145)
Sodium: 122 mmol/L — ABNORMAL LOW (ref 135–145)
Sodium: 121 mmol/L — ABNORMAL LOW (ref 135–145)

## 2020-03-20 LAB — DIC (DISSEMINATED INTRAVASCULAR COAGULATION)PANEL
D-Dimer, Quant: 10.62 ug/mL-FEU — ABNORMAL HIGH (ref 0.00–0.50)
Fibrinogen: >800 mg/dL — ABNORMAL HIGH (ref 210–475)
INR: 1.5 — ABNORMAL HIGH (ref 0.8–1.2)
Platelets: 28 10*3/uL — CL (ref 150–400)
Prothrombin Time: 17.3 seconds — ABNORMAL HIGH (ref 11.4–15.2)
Smear Review: NONE SEEN
aPTT: 48 seconds — ABNORMAL HIGH (ref 24–36)

## 2020-03-20 LAB — MRSA PCR SCREENING: MRSA by PCR: NEGATIVE

## 2020-03-20 LAB — CBC WITH DIFFERENTIAL/PLATELET
Abs Immature Granulocytes: 0.3 10*3/uL — ABNORMAL HIGH (ref 0.00–0.07)
Basophils Absolute: 0 10*3/uL (ref 0.0–0.1)
Basophils Relative: 1 %
Eosinophils Absolute: 0 10*3/uL (ref 0.0–0.5)
Eosinophils Relative: 0 %
HCT: 26.1 % — ABNORMAL LOW (ref 39.0–52.0)
Hemoglobin: 9.4 g/dL — ABNORMAL LOW (ref 13.0–17.0)
Immature Granulocytes: 7 %
Lymphocytes Relative: 4 %
Lymphs Abs: 0.2 10*3/uL — ABNORMAL LOW (ref 0.7–4.0)
MCH: 29.9 pg (ref 26.0–34.0)
MCHC: 36 g/dL (ref 30.0–36.0)
MCV: 83.1 fL (ref 80.0–100.0)
Monocytes Absolute: 0.4 10*3/uL (ref 0.1–1.0)
Monocytes Relative: 9 %
Neutro Abs: 3.5 10*3/uL (ref 1.7–7.7)
Neutrophils Relative %: 79 %
Platelets: 38 10*3/uL — ABNORMAL LOW (ref 150–400)
RBC: 3.14 MIL/uL — ABNORMAL LOW (ref 4.22–5.81)
RDW: 13 % (ref 11.5–15.5)
WBC: 4.4 10*3/uL (ref 4.0–10.5)
nRBC: 2.5 % — ABNORMAL HIGH (ref 0.0–0.2)

## 2020-03-20 LAB — RETIC PANEL
Immature Retic Fract: 24.3 % — ABNORMAL HIGH (ref 2.3–15.9)
RBC.: 3.23 MIL/uL — ABNORMAL LOW (ref 4.22–5.81)
Retic Count, Absolute: 66.2 10*3/uL (ref 19.0–186.0)
Retic Ct Pct: 2.1 % (ref 0.4–3.1)
Reticulocyte Hemoglobin: 32.7 pg (ref >27.9)

## 2020-03-20 LAB — PROTIME-INR
INR: 1.4 — ABNORMAL HIGH (ref 0.8–1.2)
Prothrombin Time: 16.5 seconds — ABNORMAL HIGH (ref 11.4–15.2)

## 2020-03-20 LAB — BLOOD CULTURE ID PANEL (REFLEXED) - BCID2

## 2020-03-20 LAB — OSMOLALITY: Osmolality: 270 mOsm/kg — ABNORMAL LOW (ref 275–295)

## 2020-03-20 LAB — RESPIRATORY PANEL BY PCR

## 2020-03-20 LAB — GLUCOSE, CAPILLARY
Glucose-Capillary: 234 mg/dL — ABNORMAL HIGH (ref 70–99)
Glucose-Capillary: 138 mg/dL — ABNORMAL HIGH (ref 70–99)
Glucose-Capillary: 112 mg/dL — ABNORMAL HIGH (ref 70–99)
Glucose-Capillary: 197 mg/dL — ABNORMAL HIGH (ref 70–99)
Glucose-Capillary: 246 mg/dL — ABNORMAL HIGH (ref 70–99)

## 2020-03-20 LAB — SODIUM, URINE, RANDOM: Sodium, Ur: 11 mmol/L

## 2020-03-20 LAB — APTT: aPTT: 44 seconds — ABNORMAL HIGH (ref 24–36)

## 2020-03-20 LAB — TROPONIN I (HIGH SENSITIVITY)
Troponin I (High Sensitivity): 14 ng/L (ref <18)
Troponin I (High Sensitivity): 13 ng/L (ref <18)

## 2020-03-20 LAB — URINE CULTURE: Culture: <10000 — AB

## 2020-03-20 LAB — LACTATE DEHYDROGENASE: LDH: 284 U/L — ABNORMAL HIGH (ref 98–192)

## 2020-03-20 LAB — LACTIC ACID, PLASMA: Lactic Acid, Venous: 1.4 mmol/L (ref 0.5–1.9)

## 2020-03-20 LAB — OSMOLALITY, URINE: Osmolality, Ur: 568 mOsm/kg (ref 300–900)

## 2020-03-20 MED ORDER — SODIUM CHLORIDE 0.9 % IV BOLUS
500.0000 mL | Freq: Once | INTRAVENOUS | Status: AC
  Administered 2020-03-20: 500 mL via INTRAVENOUS

## 2020-03-20 MED ORDER — IOHEXOL 300 MG/ML  SOLN
100.0000 mL | Freq: Once | INTRAMUSCULAR | Status: AC | PRN
  Administered 2020-03-20: 100 mL via INTRAVENOUS

## 2020-03-20 MED ORDER — CEFAZOLIN SODIUM-DEXTROSE 2-4 GM/100ML-% IV SOLN
2.0000 g | Freq: Three times a day (TID) | INTRAVENOUS | Status: DC
  Administered 2020-03-20 – 2020-04-04 (×45): 2 g via INTRAVENOUS
  Filled 2020-03-20 (×46): qty 100

## 2020-03-20 MED ORDER — ONDANSETRON HCL 4 MG/2ML IJ SOLN: 4.0000 mg | Freq: Four times a day (QID) | INTRAMUSCULAR | Status: DC | PRN

## 2020-03-20 MED ORDER — INSULIN GLARGINE 100 UNIT/ML ~~LOC~~ SOLN
15.0000 [IU] | Freq: Every day | SUBCUTANEOUS | Status: DC
  Administered 2020-03-20 – 2020-03-22 (×3): 15 [IU] via SUBCUTANEOUS
  Filled 2020-03-20 (×4): qty 0.15

## 2020-03-20 MED ORDER — GADOBUTROL 1 MMOL/ML IV SOLN
6.0000 mL | Freq: Once | INTRAVENOUS | Status: AC | PRN
  Administered 2020-03-20: 6 mL via INTRAVENOUS

## 2020-03-20 MED ORDER — ONDANSETRON HCL 4 MG PO TABS: 4.0000 mg | ORAL_TABLET | Freq: Four times a day (QID) | ORAL | Status: DC | PRN

## 2020-03-20 MED ORDER — SODIUM CHLORIDE 0.9 % IV SOLN
2.0000 g | Freq: Once | INTRAVENOUS | Status: AC
  Administered 2020-03-20: 2 g via INTRAVENOUS
  Filled 2020-03-20: qty 2

## 2020-03-20 MED ORDER — SODIUM CHLORIDE 0.9 % IV SOLN
2.0000 g | Freq: Three times a day (TID) | INTRAVENOUS | Status: DC
  Administered 2020-03-20: 2 g via INTRAVENOUS
  Filled 2020-03-20: qty 2

## 2020-03-20 NOTE — Progress Notes (Signed)
PT Cancellation Note  Patient Details Name: Trevor Shelton MRN: [id number] DOB: 1966/04/12   Cancelled Treatment:    Reason Eval/Treat Not Completed: Medical issues which prohibited therapy. Pt found to have new ICH. Will await further plans from MD's prior to evaluation.    Shary Decamp [facility] 03/20/2020, 8:43 AM [place] Pager [phone] Office [phone]

## 2020-03-20 NOTE — Progress Notes (Signed)
   03/23/2020 2100  Assess: MEWS Score  Temp (!) 102.1 F (38.9 C)  BP 117/70  Pulse Rate (!) 112  ECG Heart Rate (!) 117  Resp (!) 22  Level of Consciousness Alert  SpO2 97 %  O2 Device Room Air  Assess: MEWS Score  MEWS Temp 2  MEWS Systolic 0  MEWS Pulse 2  MEWS RR 1  MEWS LOC 0  MEWS Score 5  MEWS Score Color Red  Assess: if the MEWS score is Yellow or Red  Were vital signs taken at a resting state? Yes  Focused Assessment Change from prior assessment (see assessment flowsheet)  Early Detection of Sepsis Score *See Row Information* High  MEWS guidelines implemented *See Row Information* Yes  Treat  Pain Scale 0-10  Pain Score 7  Pain Type Acute pain  Pain Location Rib cage  Pain Orientation Posterior  Pain Descriptors / Indicators Aching;Discomfort  Pain Frequency Constant  Pain Onset On-going  Patients Stated Pain Goal 1  Pain Intervention(s) Medication (See eMAR)  Multiple Pain Sites No  Neuro symptoms relieved by Rest  Take Vital Signs  Increase Vital Sign Frequency  Red: Q 1hr X 4 then Q 4hr X 4, if remains red, continue Q 4hrs  Escalate  MEWS: Escalate Red: discuss with charge nurse/RN and provider, consider discussing with RRT  Notify: Charge Nurse/RN  Name of Charge Nurse/RN Notified [place], RN  Date Charge Nurse/RN Notified 03/26/2020  Time Charge Nurse/RN Notified 2115  Notify: Provider  Provider Name/Title Cyd Silence, MD  Date Provider Notified 03/20/20  Time Provider Notified 0021  Notification Type Page  Notification Reason Change in status  Response Other (Comment) (Awaiting further orders)  Notify: Rapid Response  Name of Rapid Response RN Notified Shanon Brow, RN  Date Rapid Response Notified 03/26/2020  Time Rapid Response Notified 2115  Document  Patient Outcome Transferred/level of care increased  Progress note created (see row info) Yes

## 2020-03-20 NOTE — Progress Notes (Addendum)
STROKE TEAM PROGRESS NOTE   HISTORY OF PRESENT ILLNESS (per record) Trevor Shelton is a 54 y.o. male with past medical history of hypertension and hyperlipidemia recently diagnosed with metastatic multiple myeloma complicated by lytic lesions of the spine, as well as other painful lesions (admitted 8/8-8/26, since which time he has been at rehab) He has completed radiation of spine lesions due to concern for impending cord compression and dexamethasone taper had been started.  He was started on CyBorD chemotherapy, with first dose on 9/7 (Cyclophosphamide, bortezomib and dexamethasone) with prophylactic acyclovir.  On 9/9 he had a nosebleed and received 2 units of packed red blood cells on 9/10 for hemoglobin of 6.8. At the time his intermittent pancytopenia was felt to be multifactorial due to underlying malignancy, recent epistaxis and recent chemotherapy; Lovenox was discontinued.  On 9/11 he developed high fevers and was admitted to the neuro hospitalist team for further work-up and management. On the hospitalist initial evaluation the patient was disoriented to time, for which head CT was obtained which demonstrated a lesion in the right frontal lobe concerning for possible hemorrhage.  Neurology was consulted. Of note, CT chest abdomen pelvis at the same time additionally revealed a pleural-based soft tissue mass concerning for a possible primary lung malignancy in addition to his metastatic melanoma. Additionally on review of systems he denies headache, though reports some blurry vision that he attributes to fatigue, he denies double vision, he denies photophobia or phonophobia, has not noticed difficulties with his speech, has had lower extremity weakness and incontinence of urine more than bowel, has not noticed rashes or skin changes, has significant shoulder, back, and leg pain, has not noticed blood in his urine or his stool, does have some decreased sensation in the sacral/genital region LKW:  Unclear given multiple transfers tPA given?: No, due to potential hemorrhage ICH Score: 0             Time performed: 2 AM GCS: 13-15 is 0 points Infratentorial: No.. If yes, 1 point Volume: <30cc is 0 points        Age: 54 y.o.. >80 is 1 point Intraventricular extension is 1 point A Score of 0 points has a 30 day mortality of 0%. Stroke. 2001 Apr;32(4):891-7.   INTERVAL HISTORY [name] and his wife are  at the bedside.  Televideo interpreter facilitated this visit today.  I have personally reviewed history of presenting illness, electronic medical records and imaging films in PACS.  Patient developed disorientation and hence underwent a CT scan of the head which showed a small 1 cm right frontal cortical hemorrhage.  His platelet counts were found to be low at 38,000.  MRI scan of the brain subsequently has been done which confirmed parenchymal hemorrhage without underlying tumor or mass.  Patient has been on chemotherapy for his multiple myeloma and has had thrombocytopenia.  Patient denies any headache, slurred speech or focal neurological symptoms which could be attributed to this hemorrhage.  There is no history of fall or injury which could have brought this on either.    OBJECTIVE Vitals:   03/20/20 0101 03/20/20 0123 03/20/20 0209 03/20/20 0829  BP: 113/73 90/68 134/79 100/61  Pulse: (!) 128 (!) 125 (!) 143 (!) 115  Resp: (!) 24 (!) 25 (!) 24   Temp: 99.5 F (37.5 C) (!) 100.6 F (38.1 C) 99.8 F (37.7 C) (!) 102.3 F (39.1 C)  TempSrc: Oral Oral Oral Oral  SpO2: 97% 92% 98% 99%    CBC:  Recent Labs  Lab 04/03/2020 0517 03/11/2020 0517 03/29/2020 1802 03/20/20 0040  WBC 2.9*   < > 4.6 4.4  NEUTROABS 2.1  --   --  3.5  HGB 11.1*   < > 10.0* 9.4*  HCT 31.5*   < > 28.8* 26.1*  MCV 84.7   < > 83.0 83.1  PLT 47*   < > 46* 38*   < > = values in this interval not displayed.    Basic Metabolic Panel:  Recent Labs  Lab 03/20/20 0634 03/20/20 0904  NA 120* 121*  K 3.8  3.7  CL 93* 93*  CO2 16* 17*  GLUCOSE 158* 132*  BUN 50* 49*  CREATININE 1.27* 1.24  CALCIUM 8.1* 7.9*    Lipid Panel:     Component Value Date/Time   CHOL 175 02/25/2020 0340   TRIG 142 02/25/2020 0340   HDL 38 (L) 02/25/2020 0340   CHOLHDL 4.6 02/25/2020 0340   VLDL 28 02/25/2020 0340   LDLCALC 109 (H) 02/25/2020 0340   HgbA1c:  Lab Results  Component Value Date   HGBA1C 6.5 (H) 02/18/2020   Urine Drug Screen: No results found for: LABOPIA, COCAINSCRNUR, LABBENZ, AMPHETMU, THCU, LABBARB  Alcohol Level No results found for: [facility]  IMAGING  DG Chest 2 View 03/29/2020 IMPRESSION:  Areas of patchy atelectasis bilaterally. No edema or airspace opacity. Cardiac silhouette within normal limits.   CT HEAD WO CONTRAST 03/20/2020 IMPRESSION:  1. Small lobulated hyperdensity immediately subjacent to the right frontal calvarium as above. Highly suspicious for a small bleed. No significant mass effect or edema.  2. Multiple scattered lucent lesions throughout the calvarium and visualized upper cervical spine, consistent with history of multiple myeloma.  3. No other acute intracranial abnormality.   Repeat CT HEAD WO CONTRAST - pending   MR BRAIN W WO CONTRAST 03/20/2020 IMPRESSION:  1. Acute intracranial hemorrhage involving the anterior-inferior right frontal convexity, stable from prior head CT. No associated edema or significant mass effect.  2. No other acute intracranial abnormality.  3. Innumerable marrow replacing lesions throughout the calvarium, skull base, and visualized upper spine, compatible with history of multiple myeloma.   CT CHEST ABDOMEN PELVIS W CONTRAST 03/20/2020 IMPRESSION:  1. Mild to moderate severity bilateral areas of atelectasis and/or infiltrate.  2. 5.2 cm x 1.5 cm pleural based soft tissue mass along the anterior aspect of the left upper lobe. A 1.4 cm pleural based soft tissue nodule is seen within this region on the prior study. These findings  are concerning for the presence of a primary lung malignancy. Further evaluation with a nuclear medicine PET/CT is recommended.  3. Findings consistent with multiple myeloma versus diffuse osseous metastasis, with chronic pathologic fractures at the levels of T6 and T9 vertebral bodies.  4. Markedly enlarged prostate gland.  5. 4.0 cm x 3.2 cm fat-containing right scrotal hernia.  6. Mild cardiomegaly.  7. Small hiatal hernia.  8. Aortic atherosclerosis.  9. Small simple cyst within the left kidney.  Aortic Atherosclerosis (ICD10-I70.0).   DG Abd Portable 1V 04/06/2020 IMPRESSION:  1. Nonspecific bowel gas pattern with gaseous distention of loops of small bowel and colon scattered throughout the abdomen.  2. Above average amount of stool throughout the ascending colon.   Bilateral Lower Extremity Venous Dopplers - 03/03/20 - negative for DVT  ECG - ST rate 126 BPM. (See cardiology reading for complete details)  PHYSICAL EXAM Blood pressure 100/61, pulse (!) 115, temperature (!) 102.3 F (39.1 C), temperature  source Oral, resp. rate (!) 24, SpO2 99 %. Mildly obese middle-aged Hispanic male not in distress. . Afebrile. Head is nontraumatic. Neck is supple without bruit.    Cardiac exam no murmur or gallop. Lungs are clear to auscultation. Distal pulses are well felt.  There is mild healed blood over both upper and lower lips.  Neurological Exam: Exam is limited due to necessity to use televideo interpreter.  He is awake alert seems oriented to time place and person.  Speech appears clear without dysarthria.  Extraocular movements are full range without nystagmus.  He follows simple midline and one-step commands well.  No facial weakness.  Tongue midline. Motor system exam able to move all 4 extremities equally well against gravity without focal weakness.  Shoulder elevation limited due to pain on the right.  Sensation intact bilaterally.  Reflexes symmetric.  Plantars downgoing.  Gait not  tested. ASSESSMENT/PLAN [name] is a 54 y.o. male with history of hypertension, AKI, DM, hypercalcemia, and hyperlipidemia recently diagnosed with metastatic multiple myeloma complicated by lytic lesions of the spine, as well as other painful lesions, treated with radiation of spine lesions due to concern for impending cord compression and CyBorD chemotherapy with prophylactic acyclovir, new possible primary lung malignancy, recent epistaxis requiring transfusion for pancytopenia, a head CT performed for AMS demonstrated a lesion in the right frontal lobe concerning for possible hemorrhage prompting a neurology consult. Recent symptoms include blurred vision, lower extremity weakness, incontinence of urine more than bowel, significant shoulder, back, and leg pain, as well as decreased sensation in the sacral/genital region. He did not receive IV t-PA due to hemorrhage.   Stroke: Acute intracranial hemorrhage involving the anterior-inferior right frontal convexity-likely due to thrombocytopenia from effect of multiple myeloma and chemotherapy for its treatment.-Doubt this represents hypertensive, hemorrhagic infarct or septic emboli  Resultant no focal neuro deficits  Code Stroke CT Head - not ordered  CT head - Small lobulated hyperdensity immediately subjacent to the right frontal calvarium as above. Highly suspicious for a small bleed.  CT Head WO - repeat - pending  MRI head - Acute intracranial hemorrhage involving the anterior-inferior right frontal convexity, stable from prior head CT.   MRA head - not ordered  CTA H&N - not ordered  CT Perfusion - not ordered  CT Chest Abd and Pelvus - 5.2 cm x 1.5 cm pleural based soft tissue mass along the anterior aspect of the left upper lobe. A 1.4 cm pleural based soft tissue nodule is seen within this region on the prior study. These findings are concerning for the presence of a primary lung malignancy.  Carotid Doppler - not  indicated  2D Echo - not indicated  [employer] Virus 2 - negative  LDL - 109  HgbA1c - 6.5  UDS - not ordered  VTE prophylaxis - SCDs Diet  Diet Order            Diet NPO time specified  Diet effective now                 No antithrombotic prior to admission, now on No antithrombotic  Ongoing aggressive stroke risk factor management  Therapy recommendations:  pending  Disposition:  Pending  Hypertension  Home BP meds: Norvasc ; Coreg  Current BP meds: Coreg  Stable  SBP goal < 140 mm Hg initially . Long-term BP goal normotensive  Hyperlipidemia  Home Lipid lowering medication: Zocor 20 mg daily  LDL 109, goal < 70  Current  lipid lowering medication: Lipitor 20 mg daily -> will D/C - contraindicated in [place]  Continue statin at discharge  Diabetes (Decadron)  Home diabetic meds: insulin  Current diabetic meds: insulin  HgbA1c 6.5, goal < 7.0 Recent Labs    04/07/2020 2148 03/30/2020 2222 03/20/20 0826  GLUCAP 234* 228* 138*    Other Stroke Risk Factors  Previous ETOH use  Previous drug use  Other Active Problems  Code status - Full code  Aortic Atherosclerosis (ICD10-I70.0)   AKI - creatinine - 127->124  Anemia - Hgb - 9.4 Thrombocytopenia - platelets - 38 Hyponatremia - Na - 121 Hypocalcemia - 7.9 Tachycardia - 115 - 143 - likely due to fever Fever - 102.3 - Maxipime ; Vancomycin ; Diflucan Multiple Myeloma Possible primary lung malignancy NPO - tube feedings  Hospital day # 1 Presented with confusion disorientation without any other focal deficits and CT scan and subsequently MRI confirmed small 1 cm right frontal parenchymal hemorrhage with slight subdural extension.  Etiology is likely related to severe thrombocytopenia which is a result of multiple myeloma and its resultant chemotherapy side effect.  Recommend repeat CT scan of the head this afternoon and if hemorrhage is stable I do not believe platelet transfusion is  necessary at this point unless the patient starts bleeding externally and counts drop further.  Long discussion with Dr. Burr Medico at the bedside as well as with patient and his wife using Spanish language interpreter and answered questions.  Discussed with Dr. Antonieta Pert.  Greater than 50% time during this 35-minute visit was spent on counseling and coordination of care about his intracerebral hemorrhage and thrombocytopenia and answering questions Antony Contras, MD To contact Stroke Continuity provider, please refer to [email or web address] After hours, contact General Neurology  Addendum :  Repeat CT scan of the head shows decrease in size of the right frontal superficial cortical hemorrhage without significant mass-effect or worsening. I do not believe that the transfusion is indicated at the present time from neurological standpoint.  If hematologist believes it is necessary he may give it. Antony Contras, MD Medical Director [facility] Pager: [phone] 03/20/2020 3:42 PM

## 2020-03-20 NOTE — Consult Note (Signed)
[facility]    Date of Admission:  04/01/2020   Total days of antibiotics: 1 vanco/zosyn               Reason for Consult: Staph bacteremia    Referring Provider: CHAMP!   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 Continue po valtrex 2. Topical acyclovir has not been found to be beneficial, will d/c.  3. Change anbx to ancef 4. Repeat BCx in AM 5. Check TTE (strongly suggest TEE)  Comment Not clear if his bleed is due to metastatic lesions from his bacteremia (and endocarditis?) or related to his platelets.  He has 2 peripheral lines, I do not see a port or central line.  Appreciate neuro f/u.   Thank you so much for this interesting consult,  Active Problems:   AKI (acute kidney injury) [facility])   Pathologic fracture   Hyponatremia   Multiple myeloma not having achieved remission ([place])   Diabetes mellitus ([place])   Hypertension   Constipation   Incomplete paraplegia ([place])   Other pancytopenia ([place])   Fever and neutropenia ([place])   AMS (altered mental status)   Intracerebral hemorrhage   . (feeding supplement) PROSource Plus  30 mL Oral BID BM  . acyclovir  400 mg Oral Daily  . acyclovir ointment   Topical 6 X Daily  . baclofen  10 mg Oral QID  . carvedilol  3.125 mg Oral BID WC  . dexamethasone  2 mg Oral Q12H  . fluconazole  100 mg Oral Daily  . insulin aspart  0-9 Units Subcutaneous TID WC  . insulin aspart  3 Units Subcutaneous TID WC  . insulin glargine  15 Units Subcutaneous QHS  . lidocaine  2 patch Transdermal Q24H  . midodrine  7.5 mg Oral BID WC  . oxymetazoline  2 spray Each Nare TID with meals  . pantoprazole  40 mg Oral BID AC  . polyethylene glycol  17 g Oral Daily  . Ensure Max Protein  11 oz Oral BID BM  . QUEtiapine  100 mg Oral QHS  .  senna-docusate  2 tablet Oral BID  . sodium chloride  2 spray Each Nare Q1H while awake  . sucralfate  1 g Oral TID WC & HS  . traMADol  50 mg Oral Q6H    HPI: Trevor Shelton is a 54 y.o. male with hx of "prediabetes" per his wife, adm 8-8 to 8-26 when he was diagnosed with multiple myeloma. He was noted to have lytic spinal lesions, rib fractures and cord compression. He was d/c to rehab.  He received chemotherapy on 9-7 (cyclophosphamide, bortezomib and dexamethasone) as well as having received XRT.  He developed fever to 102 on 9-11 and was transferred back to [facility]. He was started on vanco/zosyn.  He is now found to have CNS bleed (R frontal lobe) as well as BCx Staph aureus.   Review of Systems: Review of Systems  Constitutional: Positive for fever.  HENT: Positive for nosebleeds.   Eyes: Positive for blurred vision.  Respiratory: Negative for cough.   Cardiovascular: Positive for chest pain.  Gastrointestinal: Negative for constipation and diarrhea.  Genitourinary: Negative for dysuria.  Please see HPI. All other systems reviewed and negative.  Pt seen with video interpreter  Past Medical History:  Diagnosis Date  .  AKI (acute kidney injury) ([place]) 02/2020  . Hypercalcemia 02/2020  . Hypercholesteremia   . Hypertension     Social History   Tobacco Use  . Smoking status: Never Smoker  . Smokeless tobacco: Never Used  Vaping Use  . Vaping Use: Never used  Substance Use Topics  . Alcohol use: Not Currently  . Drug use: Not Currently    Family History  Problem Relation Age of Onset  . Alzheimer's disease Mother   . Diabetes Father   . Diabetes Brother      Medications:  Scheduled: . (feeding supplement) PROSource Plus  30 mL Oral BID BM  . acyclovir  400 mg Oral Daily  . acyclovir ointment   Topical 6 X Daily  . baclofen  10 mg Oral QID  . carvedilol  3.125 mg Oral BID WC  . dexamethasone  2 mg Oral Q12H  . fluconazole  100 mg Oral Daily  . insulin aspart   0-9 Units Subcutaneous TID WC  . insulin aspart  3 Units Subcutaneous TID WC  . insulin glargine  15 Units Subcutaneous QHS  . lidocaine  2 patch Transdermal Q24H  . midodrine  7.5 mg Oral BID WC  . oxymetazoline  2 spray Each Nare TID with meals  . pantoprazole  40 mg Oral BID AC  . polyethylene glycol  17 g Oral Daily  . Ensure Max Protein  11 oz Oral BID BM  . QUEtiapine  100 mg Oral QHS  . senna-docusate  2 tablet Oral BID  . sodium chloride  2 spray Each Nare Q1H while awake  . sucralfate  1 g Oral TID WC & HS  . traMADol  50 mg Oral Q6H    Abtx:  Anti-infectives (From admission, onward)   Start     Dose/Rate Route Frequency Ordered Stop   03/20/20 1000  acyclovir (ZOVIRAX) 200 MG capsule 400 mg        400 mg Oral Daily 03/28/2020 2217     03/20/20 1000  fluconazole (DIFLUCAN) tablet 100 mg        100 mg Oral Daily 03/10/2020 2217 03/23/20 0959   03/20/20 0800  vancomycin (VANCOREADY) IVPB 750 mg/150 mL        750 mg 150 mL/hr over 60 Minutes Intravenous Every 12 hours 03/31/2020 2217     03/20/20 0800  ceFEPIme (MAXIPIME) 2 g in sodium chloride 0.9 % 100 mL IVPB        2 g 200 mL/hr over 30 Minutes Intravenous Every 8 hours 03/20/20 0001     03/20/20 0015  ceFEPIme (MAXIPIME) 2 g in sodium chloride 0.9 % 100 mL IVPB        2 g 200 mL/hr over 30 Minutes Intravenous  Once 03/20/20 0001 03/20/20 0159   04/01/2020 2230  vancomycin (VANCOREADY) IVPB 1250 mg/250 mL  Status:  Discontinued        1,250 mg 166.7 mL/hr over 90 Minutes Intravenous  Once 03/25/2020 2217 04/07/2020 2225   03/16/2020 2224  piperacillin-tazobactam (ZOSYN) IVPB 3.375 g  Status:  Discontinued        3.375 g 12.5 mL/hr over 240 Minutes Intravenous Every 8 hours 03/14/2020 2217 03/20/20 0000        OBJECTIVE: Blood pressure 96/68, pulse (!) 107, temperature (!) 101.3 F (38.5 C), temperature source Oral, resp. rate (!) 23, SpO2 99 %.  Physical Exam Constitutional:      General: He is not in acute distress.     Appearance:  Normal appearance. He is not toxic-appearing.  HENT:     Mouth/Throat:     Mouth: Mucous membranes are moist.     Pharynx: No oropharyngeal exudate.     Comments: Scabs on upper/lower lips.  Cardiovascular:     Rate and Rhythm: Normal rate and regular rhythm.  Pulmonary:     Effort: Pulmonary effort is normal.     Breath sounds: Normal breath sounds.  Abdominal:     General: Bowel sounds are normal. There is no distension.     Palpations: Abdomen is soft.     Tenderness: There is no abdominal tenderness.  Musculoskeletal:     Right lower leg: No edema.     Left lower leg: No edema.  Neurological:     Mental Status: He is alert.     Motor: No weakness (grip strength, plantar strength 4/4).  Psychiatric:        Mood and Affect: Mood normal.     Lab Results Results for orders placed or performed during the hospital encounter of 04/04/2020 (from the past 48 hour(s))  Glucose, capillary     Status: Abnormal   Collection Time: 03/10/2020  9:48 PM  Result Value Ref Range   Glucose-Capillary 234 (H) 70 - 99 mg/dL    Comment: Glucose reference range applies only to samples taken after fasting for at least 8 hours.  Glucose, capillary     Status: Abnormal   Collection Time: 03/15/2020 10:22 PM  Result Value Ref Range   Glucose-Capillary 228 (H) 70 - 99 mg/dL    Comment: Glucose reference range applies only to samples taken after fasting for at least 8 hours.  CBC with Differential/Platelet     Status: Abnormal   Collection Time: 03/20/20 12:40 AM  Result Value Ref Range   WBC 4.4 4.0 - 10.5 K/uL   RBC 3.14 (L) 4.22 - 5.81 MIL/uL   Hemoglobin 9.4 (L) 13.0 - 17.0 g/dL   HCT 26.1 (L) 39 - 52 %   MCV 83.1 80.0 - 100.0 fL   MCH 29.9 26.0 - 34.0 pg   MCHC 36.0 30.0 - 36.0 g/dL   RDW 13.0 11.5 - 15.5 %   Platelets 38 (L) 150 - 400 K/uL    Comment: REPEATED TO VERIFY Immature Platelet Fraction may be clinically indicated, consider ordering this additional test [id number]     nRBC 2.5 (H) 0.0 - 0.2 %   Neutrophils Relative % 79 %   Neutro Abs 3.5 1.7 - 7.7 K/uL   Lymphocytes Relative 4 %   Lymphs Abs 0.2 (L) 0.7 - 4.0 K/uL   Monocytes Relative 9 %   Monocytes Absolute 0.4 0 - 1 K/uL   Eosinophils Relative 0 %   Eosinophils Absolute 0.0 0 - 0 K/uL   Basophils Relative 1 %   Basophils Absolute 0.0 0 - 0 K/uL   WBC Morphology MILD LEFT SHIFT (1-5% METAS, OCC MYELO, OCC BANDS)    Immature Granulocytes 7 %   Abs Immature Granulocytes 0.30 (H) 0.00 - 0.07 K/uL    Comment: Performed at [facility], [address], [place], [place] [address]  Basic metabolic panel     Status: Abnormal   Collection Time: 03/20/20 12:40 AM  Result Value Ref Range   Sodium 114 (LL) 135 - 145 mmol/L    Comment: CRITICAL RESULT CALLED TO, READ BACK BY AND VERIFIED WITH: GREESON D,RN 03/20/20 0117 WAYK    Potassium 4.0 3.5 - 5.1 mmol/L  Chloride 87 (L) 98 - 111 mmol/L   CO2 17 (L) 22 - 32 mmol/L   Glucose, Bld 213 (H) 70 - 99 mg/dL    Comment: Glucose reference range applies only to samples taken after fasting for at least 8 hours.   BUN 50 (H) 6 - 20 mg/dL   Creatinine, Ser 1.14 0.61 - 1.24 mg/dL   Calcium 7.9 (L) 8.9 - 10.3 mg/dL   GFR calc non Af Amer >60 >60 mL/min   GFR calc Af Amer >60 >60 mL/min   Anion gap 10 5 - 15    Comment: Performed at [facility] [address] [place], [place] [address]  Osmolality     Status: Abnormal   Collection Time: 03/20/20 12:40 AM  Result Value Ref Range   Osmolality 270 (L) 275 - 295 mOsm/kg    Comment: Performed at [facility], [place] [address] [place], [place] [address]  Lactic acid, plasma     Status: None   Collection Time: 03/20/20 12:40 AM  Result Value Ref Range   Lactic Acid, Venous 1.4 0.5 - 1.9 mmol/L    Comment: Performed at [facility] [address] [place], [place] [address]  APTT     Status: Abnormal   Collection Time: 03/20/20  2:01 AM  Result Value Ref Range   aPTT 44 (H) 24 - 36  seconds    Comment:        IF BASELINE aPTT IS ELEVATED, SUGGEST PATIENT RISK ASSESSMENT BE USED TO DETERMINE APPROPRIATE ANTICOAGULANT THERAPY. Performed at [facility], [place] [address] [place], [place] [address]   Protime-INR     Status: Abnormal   Collection Time: 03/20/20  2:01 AM  Result Value Ref Range   Prothrombin Time 16.5 (H) 11.4 - 15.2 seconds   INR 1.4 (H) 0.8 - 1.2    Comment: (NOTE) INR goal varies based on device and disease states. Performed at [facility], [place] [address], [place], [place] [address]   Sodium, urine, random     Status: None   Collection Time: 03/20/20  2:28 AM  Result Value Ref Range   Sodium, Ur 11 mmol/L    Comment: Performed at [facility] [address] [place], [place] [address]  Osmolality, urine     Status: None   Collection Time: 03/20/20  2:28 AM  Result Value Ref Range   Osmolality, Ur 568 300 - 900 mOsm/kg    Comment: Performed at [facility] [address], [place], [place] [address]  MRSA PCR Screening     Status: None   Collection Time: 03/20/20  4:21 AM   Specimen: Nasopharyngeal  Result Value Ref Range   MRSA by PCR NEGATIVE NEGATIVE    Comment:        The GeneXpert MRSA Assay (FDA approved for NASAL specimens only), is one component of a comprehensive MRSA colonization surveillance program. It is not intended to diagnose MRSA infection nor to guide or monitor treatment for MRSA infections. Performed at [facility], [place] [address], [place], [place] [address]   Respiratory Panel by PCR     Status: None   Collection Time: 03/20/20  4:21 AM   Specimen: Nasopharyngeal Swab; Respiratory  Result Value Ref Range   Adenovirus NOT DETECTED NOT DETECTED   Coronavirus 229E NOT DETECTED NOT DETECTED    Comment: (NOTE) The Coronavirus on the Respiratory Panel, DOES NOT test for  the novel  Coronavirus (2019 nCoV)    Coronavirus HKU1 NOT DETECTED NOT DETECTED   Coronavirus NL63 NOT DETECTED  NOT DETECTED   Coronavirus OC43 NOT DETECTED NOT DETECTED   Metapneumovirus NOT DETECTED NOT DETECTED   Rhinovirus / Enterovirus NOT DETECTED NOT DETECTED   Influenza A NOT DETECTED NOT DETECTED   Influenza B NOT DETECTED NOT DETECTED   Parainfluenza Virus 1 NOT DETECTED NOT DETECTED   Parainfluenza Virus 2 NOT DETECTED NOT DETECTED   Parainfluenza Virus 3 NOT DETECTED NOT DETECTED   Parainfluenza Virus 4 NOT DETECTED NOT DETECTED   Respiratory Syncytial Virus NOT DETECTED NOT DETECTED   Bordetella pertussis NOT DETECTED NOT DETECTED   Chlamydophila pneumoniae NOT DETECTED NOT DETECTED   Mycoplasma pneumoniae NOT DETECTED NOT DETECTED    Comment: Performed at [facility], [address] [place], [place] [address]  Basic metabolic panel     Status: Abnormal   Collection Time: 03/20/20  6:34 AM  Result Value Ref Range   Sodium 120 (L) 135 - 145 mmol/L   Potassium 3.8 3.5 - 5.1 mmol/L   Chloride 93 (L) 98 - 111 mmol/L   CO2 16 (L) 22 - 32 mmol/L   Glucose, Bld 158 (H) 70 - 99 mg/dL    Comment: Glucose reference range applies only to samples taken after fasting for at least 8 hours.   BUN 50 (H) 6 - 20 mg/dL   Creatinine, Ser 9.53 (H) 0.61 - 1.24 mg/dL   Calcium 8.1 (L) 8.9 - 10.3 mg/dL   GFR calc non Af Amer >60 >60 mL/min   GFR calc Af Amer >60 >60 mL/min   Anion gap 11 5 - 15    Comment: Performed at [facility], [address] [place], [place] [address]  Retic Panel     Status: Abnormal   Collection Time: 03/20/20  6:34 AM  Result Value Ref Range   Retic Ct Pct 2.1 0.4 - 3.1 %   RBC. 3.23 (L) 4.22 - 5.81 MIL/uL   Retic Count, Absolute 66.2 19.0 - 186.0 K/uL   Immature Retic Fract 24.3 (H) 2.3 - 15.9 %   Reticulocyte Hemoglobin 32.7 >27.9 pg    Comment:        Given the high negative predictive value of a RET-He result > 32 pg iron deficiency is essentially excluded. If this patient is anemic other etiologies should be considered. Performed at [facility], [address], [place], [place] [address]   Lactate dehydrogenase     Status: Abnormal   Collection Time: 03/20/20  6:34 AM  Result Value Ref Range   LDH 284 (H) 98 - 192 U/L    Comment: Performed at [facility], [address] [place], [place] [address]  Glucose, capillary     Status: Abnormal   Collection Time: 03/20/20  8:26 AM  Result Value Ref Range   Glucose-Capillary 138 (H) 70 - 99 mg/dL    Comment: Glucose reference range applies only to samples taken after fasting for at least 8 hours.   Comment 1 Notify RN   Troponin I (High Sensitivity)     Status: None   Collection Time: 03/20/20  9:04 AM  Result Value Ref Range   Troponin I (High Sensitivity) 14 <18 ng/L    Comment: (NOTE) Elevated high sensitivity troponin I (hsTnI) values and significant  changes across serial measurements may suggest ACS but many other  chronic and acute conditions  are known to elevate hsTnI results.  Refer to the "Links" section for chest pain algorithms and additional  guidance. Performed at [facility], [place] [address], [place], [place] [address]   Basic metabolic panel     Status: Abnormal   Collection Time: 03/20/20  9:04 AM  Result Value Ref Range   Sodium 121 (L) 135 - 145 mmol/L   Potassium 3.7 3.5 - 5.1 mmol/L   Chloride 93 (L) 98 - 111 mmol/L   CO2 17 (L) 22 - 32 mmol/L   Glucose, Bld 132 (H) 70 - 99 mg/dL    Comment: Glucose reference range applies only to samples taken after fasting for at least 8 hours.   BUN 49 (H) 6 - 20 mg/dL   Creatinine, Ser 1.24 0.61 - 1.24 mg/dL   Calcium 7.9 (L) 8.9 - 10.3 mg/dL   GFR calc non Af Amer >60 >60 mL/min   GFR calc Af Amer >60 >60 mL/min   Anion gap 11 5 - 15    Comment: Performed at [facility] [address] [place], [place] [address]  Troponin I (High Sensitivity)     Status: None   Collection Time: 03/20/20 11:34 AM  Result Value Ref Range   Troponin I (High Sensitivity) 13 <18 ng/L    Comment:  (NOTE) Elevated high sensitivity troponin I (hsTnI) values and significant  changes across serial measurements may suggest ACS but many other  chronic and acute conditions are known to elevate hsTnI results.  Refer to the "Links" section for chest pain algorithms and additional  guidance. Performed at [facility], [place] [address] [place] [address] [place], [place] [address]   DIC Panel (Not at [facility]) ONCE - STAT     Status: Abnormal   Collection Time: 03/20/20 11:34 AM  Result Value Ref Range   Prothrombin Time 17.3 (H) 11.4 - 15.2 seconds   INR 1.5 (H) 0.8 - 1.2    Comment: (NOTE) INR goal varies based on device and disease states.    aPTT 48 (H) 24 - 36 seconds    Comment:        IF BASELINE aPTT IS ELEVATED, SUGGEST PATIENT RISK ASSESSMENT BE USED TO DETERMINE APPROPRIATE ANTICOAGULANT THERAPY.    Fibrinogen >800 (H) 210 - 475 mg/dL    Comment: CORRECTED ON 09/12 AT 1224: PREVIOUSLY REPORTED AS >800 REPEATED TO VERIFY   D-Dimer, Quant 10.62 (H) 0.00 - 0.50 ug/mL-FEU    Comment: (NOTE) At the manufacturer cut-off of 0.50 ug/mL FEU, this assay has been documented to exclude PE with a sensitivity and negative predictive value of 97 to 99%.  At this time, this assay has not been approved by the FDA to exclude DVT/VTE. Results should be correlated with clinical presentation.    Platelets 28 (LL) 150 - 400 K/uL    Comment: REPEATED TO VERIFY PLATELET COUNT CONFIRMED BY SMEAR Immature Platelet Fraction may be clinically indicated, consider ordering this additional test [id number] THIS CRITICAL RESULT HAS VERIFIED AND BEEN CALLED TO I HENDERSON RN BY ALLISON BENNETT ON 09 12 2021 AT 1221, AND HAS BEEN READ BACK.     Smear Review NO SCHISTOCYTES SEEN     Comment: Performed at [facility], [place] [address], [place], [place] [address]  Glucose, capillary     Status: Abnormal   Collection Time: 03/20/20 12:37 PM  Result Value Ref Range   Glucose-Capillary 112 (H) 70 - 99 mg/dL     Comment: Glucose  reference range applies only to samples taken after fasting for at least 8 hours.      Component Value Date/Time   SDES BLOOD RIGHT ANTECUBITAL 03/20/2020 1802   SPECREQUEST  03/25/2020 1802    BOTTLES DRAWN AEROBIC AND ANAEROBIC Blood Culture adequate volume   CULT GRAM POSITIVE COCCI 03/10/2020 1802   REPTSTATUS PENDING 03/17/2020 1802   DG Chest 2 View  Result Date: 03/17/2020 CLINICAL DATA:  Shortness of breath and fever EXAM: CHEST - 2 VIEW COMPARISON:  February 25, 2020 FINDINGS: There is mild atelectatic change in the left mid lung and bibasilar regions. There is no edema or airspace opacity. Heart is upper normal in size with pulmonary vascularity normal. No adenopathy. No bone lesions. IMPRESSION: Areas of patchy atelectasis bilaterally. No edema or airspace opacity. Cardiac silhouette within normal limits. Electronically Signed   By: Lowella Grip III M.D.   On: 03/09/2020 15:21   CT HEAD WO CONTRAST  Result Date: 03/20/2020 CLINICAL DATA:  Stroke, follow-up. EXAM: CT HEAD WITHOUT CONTRAST TECHNIQUE: Contiguous axial images were obtained from the base of the skull through the vertex without intravenous contrast. COMPARISON:  Brain MRI 03/20/2020. CT head 03/20/2020 FINDINGS: Brain: The examination is mild to moderately motion degraded, limiting evaluation. A previously demonstrated foci of intracranial hemorrhage along the anteroinferior right frontal lobe convexity (suspected both intraparenchymal and extra-axial) has decreased in conspicuity (series 5, image 16, image 17). No new site of acute intracranial hemorrhage is identified. There is no significant mass effect.  No midline shift. Vascular: No hyperdense vessel Skull: Redemonstrated are innumerable marrow replacing lesions throughout the calvarium, skull base and visualized upper cervical spine compatible with the known history of multiple myeloma Sinuses/Orbits: No acute orbital abnormality identified. No  significant paranasal sinus disease or mastoid effusion. IMPRESSION: Motion degraded examination. Interval decrease in size and conspicuity of a known focus of intracranial hemorrhage along the anteroinferior right frontal lobe convexity (suspected both intraparenchymal and extra-axial). No new site of acute intracranial hemorrhage is identified. Redemonstrated innumerable marrow replacing lesions throughout the calvarium, skull base and visualized upper cervical spine compatible with the known history of multiple myeloma. Electronically Signed   By: Kellie Simmering DO   On: 03/20/2020 13:39   CT HEAD WO CONTRAST  Result Date: 03/20/2020 CLINICAL DATA:  Initial evaluation for acute altered mental status. EXAM: CT HEAD WITHOUT CONTRAST TECHNIQUE: Contiguous axial images were obtained from the base of the skull through the vertex without intravenous contrast. COMPARISON:  None available. FINDINGS: Brain: Cerebral volume within normal limits for age. There is a somewhat focal lobulated hyperdensity immediately subjacent to the right frontal calvarium (series 3, image 18) finding also seen on coronal sequence (series 5, images 21-23) and sagittal sequence (series 6, image 23). Lesion measures up to 2.1 cm in greatest dimension on sagittal sequence. While this area is often prone to artifact, finding is suspicious for a small bleed. Finding is favored to be intra-axial in nature, although a small extra-axial component may be present as well. No significant mass effect or edema. No other acute intracranial hemorrhage. No acute large vessel territory infarct. No mass lesion, midline shift, or mass effect. No hydrocephalus. Vascular: No hyperdense vessel. Skull: Scalp soft tissues demonstrate no acute finding. Multiple scattered lucent lesion seen throughout the calvarium and visualized upper cervical spine. Note made of a prominent 1.7 cm lytic lesion within the clivus. Findings are nonspecific, but concerning for  possible Mets or myeloma. Sinuses/Orbits: Globes and orbital soft tissues within  normal limits. Paranasal sinuses are clear. Trace left mastoid effusion noted. Other: None. IMPRESSION: 1. Small lobulated hyperdensity immediately subjacent to the right frontal calvarium as above. Highly suspicious for a small bleed. No significant mass effect or edema. 2. Multiple scattered lucent lesions throughout the calvarium and visualized upper cervical spine, consistent with history of multiple myeloma. 3. No other acute intracranial abnormality. Critical Value/emergent results were called by telephone at the time of interpretation on 03/20/2020 at 1:12 am to provider NA LI , who verbally acknowledged these results. Electronically Signed   By: Rise Mu M.D.   On: 03/20/2020 01:20   MR BRAIN W WO CONTRAST  Result Date: 03/20/2020 CLINICAL DATA:  Initial evaluation for brain mass or lesion. EXAM: MRI HEAD WITHOUT AND WITH CONTRAST TECHNIQUE: Multiplanar, multiecho pulse sequences of the brain and surrounding structures were obtained without and with intravenous contrast. CONTRAST:  69mL GADAVIST GADOBUTROL 1 MMOL/ML IV SOLN COMPARISON:  Prior head CT from earlier the same day. FINDINGS: Brain: Cerebral volume within normal limits for age. No significant cerebral white matter disease or other focal parenchymal signal abnormality. Abnormal T1/FLAIR hyperintensity involving the anterior inferior right frontal convexity, consistent with acute intracranial hemorrhage, corresponding with abnormality on prior CT (series 11, images 10-14). This appears to be both intraparenchymal and extra-axial, and is favored to have initially represented a small parenchymal bleed that developed subdural/extra-axial extension. Size of this hemorrhage measures approximately 1.8 cm in greatest dimension on sagittal sequences. No associated edema or significant regional mass effect. No definite underlying lesion. Slight asymmetric smooth  dural thickening and enhancement seen elsewhere about the right cerebral convexity, favored to be reactive. Consideration given to possible myelomatous involvement of the dura, although this would be expected to be more nodular and irregular in appearance. No other acute intracranial hemorrhage identified. Single punctate focus of susceptibility artifact noted at the right frontal centrum semi ovale, likely a small chronic microhemorrhage, of doubtful significance in isolation. No evidence for acute or subacute infarct. No encephalomalacia to suggest chronic cortical infarction. No appreciable intra-axial mass lesion. Ventricles normal size without hydrocephalus. No other extra-axial fluid collection. 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 Midline structures intact. No other abnormal enhancement. Vascular: Major intracranial vascular flow voids are maintained. Skull and upper cervical spine: Craniocervical junction within normal limits. Extensive and innumerable marrow replacing lesions seen throughout the calvarium, skull base, and visualized upper spine, compatible with history of multiple myeloma. Most prominent of these lesions positioned in the clivus and measures approximately 2 cm in size. No other scalp soft tissue abnormality. Sinuses/Orbits: Globes and orbital soft tissues within normal limits. Paranasal sinuses are largely clear. No significant mastoid effusion. Inner ear structures grossly normal. Other: None. IMPRESSION: 1. Acute intracranial hemorrhage involving the anterior-inferior right frontal convexity, stable from prior head CT. No associated edema or significant mass effect. 2. No other acute intracranial abnormality. 3. Innumerable marrow replacing lesions throughout the calvarium, skull base, and visualized upper spine, compatible with history of multiple myeloma. Electronically Signed   By: Rise Mu M.D.   On: 03/20/2020 07:35   CT CHEST ABDOMEN PELVIS W CONTRAST  Result Date:  03/20/2020 CLINICAL DATA:  Abdominal distension. EXAM: CT CHEST, ABDOMEN, AND PELVIS WITH CONTRAST TECHNIQUE: Multidetector CT imaging of the chest, abdomen and pelvis was performed following the standard protocol during bolus administration of intravenous contrast. CONTRAST:  OMNIPAQUE IOHEXOL 300 MG/ML  SOLN COMPARISON:  February 14, 2020 FINDINGS: CT CHEST FINDINGS Cardiovascular: No significant vascular findings.  There is mild cardiomegaly. No pericardial effusion. Mediastinum/Nodes: No enlarged mediastinal, hilar, or axillary lymph nodes. Thyroid gland, trachea, and esophagus demonstrate no significant findings. Lungs/Pleura: Mild to moderate severity areas of atelectasis and/or infiltrate are seen within the inferior aspect of the left upper lobe posterior aspect of the superior portion of the right lower lobe and posterior aspect of the bilateral lung bases. There is a very small left pleural effusion. A 5.2 cm x 1.5 cm pleural based soft tissue mass is seen along the anterior aspect of the left upper lobe (axial CT image 22, CT series number 3). A 1.4 cm pleural based soft tissue nodule is seen within this region on the prior study. Musculoskeletal: Chronic anterior second, third and fourth left rib fractures are noted. A chronic fracture of the right scapula is also seen. Numerous lytic lesions are seen throughout the thoracic spine with a chronic pathologic fractures noted at the levels of T6 and T9 vertebral bodies. These are seen on the prior study. CT ABDOMEN PELVIS FINDINGS Hepatobiliary: A 9 mm cystic appearing area is seen within the medial aspect of the right lobe of the liver. No gallstones, gallbladder wall thickening, or biliary dilatation. Pancreas: Unremarkable. No pancreatic ductal dilatation or surrounding inflammatory changes. Spleen: Normal in size without focal abnormality. Adrenals/Urinary Tract: Adrenal glands are unremarkable. Kidneys are normal in size, without renal or  hydronephrosis. A 1.3 cm simple cyst is seen within the anteromedial aspect of the upper left kidney. Bladder is unremarkable. Stomach/Bowel: There is a small hiatal hernia. Appendix appears normal. No evidence of bowel wall thickening, distention, or inflammatory changes. A moderate amount of stool is seen within the distal sigmoid colon and rectum. Vascular/Lymphatic: No significant vascular findings are present. No enlarged abdominal or pelvic lymph nodes. Reproductive: The prostate gland is markedly enlarged. Other: A 4.0 cm x 3.2 cm fat-containing right scrotal hernia is noted. No abdominopelvic ascites. Musculoskeletal: Numerous lytic lesions are seen throughout the lumbar spine and pelvis. IMPRESSION: 1. Mild to moderate severity bilateral areas of atelectasis and/or infiltrate. 2. 5.2 cm x 1.5 cm pleural based soft tissue mass along the anterior aspect of the left upper lobe. A 1.4 cm pleural based soft tissue nodule is seen within this region on the prior study. These findings are concerning for the presence of a primary lung malignancy. Further evaluation with a nuclear medicine PET/CT is recommended. 3. Findings consistent with multiple myeloma versus diffuse osseous metastasis, with chronic pathologic fractures at the levels of T6 and T9 vertebral bodies. 4. Markedly enlarged prostate gland. 5. 4.0 cm x 3.2 cm fat-containing right scrotal hernia. 6. Mild cardiomegaly. 7. Small hiatal hernia. 8. Aortic atherosclerosis. 9. Small simple cyst within the left kidney. Aortic Atherosclerosis (ICD10-I70.0). Electronically Signed   By: Virgina Norfolk M.D.   On: 03/20/2020 01:09   DG Abd Portable 1V  Result Date: 03/10/2020 CLINICAL DATA:  Fever EXAM: X-RAY ABDOMEN 1 VIEW COMPARISON:  03/13/2020 FINDINGS: The bowel gas pattern is nonspecific with gaseous distention of loops of small bowel and colon scattered throughout the abdomen. There is an above average amount of stool throughout the ascending colon.  There is no definite pneumatosis or free air. No radiopaque kidney stones. IMPRESSION: 1. Nonspecific bowel gas pattern with gaseous distention of loops of small bowel and colon scattered throughout the abdomen. 2. Above average amount of stool throughout the ascending colon. This is not significantly changed from 03/13/2020. Electronically Signed   By: Constance Holster M.D.   On:  03/28/2020 17:46   Recent Results (from the past 240 hour(s))  Culture, Urine     Status: Abnormal   Collection Time: 03/27/2020  2:28 PM   Specimen: Urine, Random  Result Value Ref Range Status   Specimen Description URINE, RANDOM  Final   Special Requests NONE  Final   Culture (A)  Final    <10,000 COLONIES/mL INSIGNIFICANT GROWTH Performed at [facility], [address] [place], [place] [address]    Report Status 03/20/2020 FINAL  Final  Culture, blood (routine x 2)     Status: None (Preliminary result)   Collection Time: 03/25/2020  6:02 PM   Specimen: BLOOD  Result Value Ref Range Status   Specimen Description BLOOD RIGHT ANTECUBITAL  Final   Special Requests   Final    BOTTLES DRAWN AEROBIC AND ANAEROBIC Blood Culture adequate volume   Culture  Setup Time   Final    GRAM POSITIVE COCCI IN CLUSTERS IN BOTH AEROBIC AND ANAEROBIC BOTTLES Organism ID to follow CRITICAL RESULT CALLED TO, READ BACK BY AND VERIFIED WITH: Barth Kirks PHARMD, AT [place] 03/20/20 BY Rush Landmark Performed at [facility], [place] [address] [place], [place] [address]    Culture GRAM POSITIVE COCCI  Final   Report Status PENDING  Incomplete  Blood Culture ID Panel (Reflexed)     Status: Abnormal   Collection Time: 03/31/2020  6:02 PM  Result Value Ref Range Status   Enterococcus faecalis NOT DETECTED NOT DETECTED Final   Enterococcus Faecium NOT DETECTED NOT DETECTED Final   Listeria monocytogenes NOT DETECTED NOT DETECTED Final   Staphylococcus species DETECTED (A) NOT DETECTED Final    Comment: CRITICAL RESULT CALLED TO,  READ BACK BY AND VERIFIED WITH: Barth Kirks PHARMD, AT 0740 03/20/20 BY D. VANHOOK    Staphylococcus aureus (BCID) DETECTED (A) NOT DETECTED Final    Comment: CRITICAL RESULT CALLED TO, READ BACK BY AND VERIFIED WITH: Barth Kirks PHARMD, AT 0740 03/20/20 BY D. VANHOOK    Staphylococcus epidermidis NOT DETECTED NOT DETECTED Final   Staphylococcus lugdunensis NOT DETECTED NOT DETECTED Final   Streptococcus species NOT DETECTED NOT DETECTED Final   Streptococcus agalactiae NOT DETECTED NOT DETECTED Final   Streptococcus pneumoniae NOT DETECTED NOT DETECTED Final   Streptococcus pyogenes NOT DETECTED NOT DETECTED Final   A.calcoaceticus-baumannii NOT DETECTED NOT DETECTED Final   Bacteroides fragilis NOT DETECTED NOT DETECTED Final   Enterobacterales NOT DETECTED NOT DETECTED Final   Enterobacter cloacae complex NOT DETECTED NOT DETECTED Final   Escherichia coli NOT DETECTED NOT DETECTED Final   Klebsiella aerogenes NOT DETECTED NOT DETECTED Final   Klebsiella oxytoca NOT DETECTED NOT DETECTED Final   Klebsiella pneumoniae NOT DETECTED NOT DETECTED Final   Proteus species NOT DETECTED NOT DETECTED Final   Salmonella species NOT DETECTED NOT DETECTED Final   Serratia marcescens NOT DETECTED NOT DETECTED Final   Haemophilus influenzae NOT DETECTED NOT DETECTED Final   Neisseria meningitidis NOT DETECTED NOT DETECTED Final   Pseudomonas aeruginosa NOT DETECTED NOT DETECTED Final   Stenotrophomonas maltophilia NOT DETECTED NOT DETECTED Final   Candida albicans NOT DETECTED NOT DETECTED Final   Candida auris NOT DETECTED NOT DETECTED Final   Candida glabrata NOT DETECTED NOT DETECTED Final   Candida krusei NOT DETECTED NOT DETECTED Final   Candida parapsilosis NOT DETECTED NOT DETECTED Final   Candida tropicalis NOT DETECTED NOT DETECTED Final   Cryptococcus neoformans/gattii NOT DETECTED NOT DETECTED Final   Meth resistant  mecA/C and MREJ NOT DETECTED NOT DETECTED Final    Comment: Performed  at [facility], [place] [address], [place], [place] [address]  SARS Coronavirus 2 by RT PCR (hospital order, performed in [facility] hospital lab) Nasopharyngeal Nasopharyngeal Swab     Status: None   Collection Time: 04/04/2020  8:37 PM   Specimen: Nasopharyngeal Swab  Result Value Ref Range Status   SARS Coronavirus 2 NEGATIVE NEGATIVE Final    Comment: (NOTE) SARS-CoV-2 target nucleic acids are NOT DETECTED.  The SARS-CoV-2 RNA is generally detectable in upper and lower respiratory specimens during the acute phase of infection. The lowest concentration of SARS-CoV-2 viral copies this assay can detect is 250 copies / mL. A negative result does not preclude SARS-CoV-2 infection and should not be used as the sole basis for treatment or other patient management decisions.  A negative result may occur with improper specimen collection / handling, submission of specimen other than nasopharyngeal swab, presence of viral mutation(s) within the areas targeted by this assay, and inadequate number of viral copies (<250 copies / mL). A negative result must be combined with clinical observations, patient history, and epidemiological information.  Fact Sheet for Patients:   StrictlyIdeas.no  Fact Sheet for Healthcare Providers: BankingDealers.co.za  This test is not yet approved or  cleared by the [place] FDA and has been authorized for detection and/or diagnosis of SARS-CoV-2 by FDA under an Emergency Use Authorization (EUA).  This EUA will remain in effect (meaning this test can be used) for the duration of the COVID-19 declaration under Section 564(b)(1) of the Act, 21 U.S.C. section 360bbb-3(b)(1), unless the authorization is terminated or revoked sooner.  Performed at [facility], [place] [address], [place], [place] [address]   MRSA PCR Screening     Status: None   Collection Time: 03/20/20  4:21 AM   Specimen:  Nasopharyngeal  Result Value Ref Range Status   MRSA by PCR NEGATIVE NEGATIVE Final    Comment:        The GeneXpert MRSA Assay (FDA approved for NASAL specimens only), is one component of a comprehensive MRSA colonization surveillance program. It is not intended to diagnose MRSA infection nor to guide or monitor treatment for MRSA infections. Performed at [facility], [place] [address] [place], [place] [address]   Respiratory Panel by PCR     Status: None   Collection Time: 03/20/20  4:21 AM   Specimen: Nasopharyngeal Swab; Respiratory  Result Value Ref Range Status   Adenovirus NOT DETECTED NOT DETECTED Final   Coronavirus 229E NOT DETECTED NOT DETECTED Final    Comment: (NOTE) The Coronavirus on the Respiratory Panel, DOES NOT test for the novel  Coronavirus (2019 nCoV)    Coronavirus HKU1 NOT DETECTED NOT DETECTED Final   Coronavirus NL63 NOT DETECTED NOT DETECTED Final   Coronavirus OC43 NOT DETECTED NOT DETECTED Final   Metapneumovirus NOT DETECTED NOT DETECTED Final   Rhinovirus / Enterovirus NOT DETECTED NOT DETECTED Final   Influenza A NOT DETECTED NOT DETECTED Final   Influenza B NOT DETECTED NOT DETECTED Final   Parainfluenza Virus 1 NOT DETECTED NOT DETECTED Final   Parainfluenza Virus 2 NOT DETECTED NOT DETECTED Final   Parainfluenza Virus 3 NOT DETECTED NOT DETECTED Final   Parainfluenza Virus 4 NOT DETECTED NOT DETECTED Final   Respiratory Syncytial Virus NOT DETECTED NOT DETECTED Final   Bordetella pertussis NOT DETECTED NOT DETECTED Final   Chlamydophila pneumoniae NOT DETECTED  NOT DETECTED Final   Mycoplasma pneumoniae NOT DETECTED NOT DETECTED Final    Comment: Performed at [facility], [place] [address], [place], [place] [address]    Microbiology: Recent Results (from the past 240 hour(s))  Culture, Urine     Status: Abnormal   Collection Time: 03/15/2020  2:28 PM   Specimen: Urine, Random  Result Value Ref Range Status   Specimen  Description URINE, RANDOM  Final   Special Requests NONE  Final   Culture (A)  Final    <10,000 COLONIES/mL INSIGNIFICANT GROWTH Performed at [facility], [place] [address], [place], [place] [address]    Report Status 03/20/2020 FINAL  Final  Culture, blood (routine x 2)     Status: None (Preliminary result)   Collection Time: 03/22/2020  6:02 PM   Specimen: BLOOD  Result Value Ref Range Status   Specimen Description BLOOD RIGHT ANTECUBITAL  Final   Special Requests   Final    BOTTLES DRAWN AEROBIC AND ANAEROBIC Blood Culture adequate volume   Culture  Setup Time   Final    GRAM POSITIVE COCCI IN CLUSTERS IN BOTH AEROBIC AND ANAEROBIC BOTTLES Organism ID to follow CRITICAL RESULT CALLED TO, READ BACK BY AND VERIFIED WITH: Barth Kirks PHARMD, AT [place] 03/20/20 BY Rush Landmark Performed at [facility], [place] [address] [place], [place] [address]    Culture GRAM POSITIVE COCCI  Final   Report Status PENDING  Incomplete  Blood Culture ID Panel (Reflexed)     Status: Abnormal   Collection Time: 03/21/2020  6:02 PM  Result Value Ref Range Status   Enterococcus faecalis NOT DETECTED NOT DETECTED Final   Enterococcus Faecium NOT DETECTED NOT DETECTED Final   Listeria monocytogenes NOT DETECTED NOT DETECTED Final   Staphylococcus species DETECTED (A) NOT DETECTED Final    Comment: CRITICAL RESULT CALLED TO, READ BACK BY AND VERIFIED WITH: Barth Kirks PHARMD, AT 0740 03/20/20 BY D. VANHOOK    Staphylococcus aureus (BCID) DETECTED (A) NOT DETECTED Final    Comment: CRITICAL RESULT CALLED TO, READ BACK BY AND VERIFIED WITH: Barth Kirks PHARMD, AT 0740 03/20/20 BY D. VANHOOK    Staphylococcus epidermidis NOT DETECTED NOT DETECTED Final   Staphylococcus lugdunensis NOT DETECTED NOT DETECTED Final   Streptococcus species NOT DETECTED NOT DETECTED Final   Streptococcus agalactiae NOT DETECTED NOT DETECTED Final   Streptococcus pneumoniae NOT DETECTED NOT DETECTED Final   Streptococcus pyogenes  NOT DETECTED NOT DETECTED Final   A.calcoaceticus-baumannii NOT DETECTED NOT DETECTED Final   Bacteroides fragilis NOT DETECTED NOT DETECTED Final   Enterobacterales NOT DETECTED NOT DETECTED Final   Enterobacter cloacae complex NOT DETECTED NOT DETECTED Final   Escherichia coli NOT DETECTED NOT DETECTED Final   Klebsiella aerogenes NOT DETECTED NOT DETECTED Final   Klebsiella oxytoca NOT DETECTED NOT DETECTED Final   Klebsiella pneumoniae NOT DETECTED NOT DETECTED Final   Proteus species NOT DETECTED NOT DETECTED Final   Salmonella species NOT DETECTED NOT DETECTED Final   Serratia marcescens NOT DETECTED NOT DETECTED Final   Haemophilus influenzae NOT DETECTED NOT DETECTED Final   Neisseria meningitidis NOT DETECTED NOT DETECTED Final   Pseudomonas aeruginosa NOT DETECTED NOT DETECTED Final   Stenotrophomonas maltophilia NOT DETECTED NOT DETECTED Final   Candida albicans NOT DETECTED NOT DETECTED Final   Candida auris NOT DETECTED NOT DETECTED Final   Candida glabrata NOT DETECTED NOT DETECTED Final   Candida krusei NOT DETECTED NOT DETECTED Final  Candida parapsilosis NOT DETECTED NOT DETECTED Final   Candida tropicalis NOT DETECTED NOT DETECTED Final   Cryptococcus neoformans/gattii NOT DETECTED NOT DETECTED Final   Meth resistant mecA/C and MREJ NOT DETECTED NOT DETECTED Final    Comment: Performed at [facility], [address], [place], [place] [address]  SARS Coronavirus 2 by RT PCR (hospital order, performed in [facility] hospital lab) Nasopharyngeal Nasopharyngeal Swab     Status: None   Collection Time: 03/13/2020  8:37 PM   Specimen: Nasopharyngeal Swab  Result Value Ref Range Status   SARS Coronavirus 2 NEGATIVE NEGATIVE Final    Comment: (NOTE) SARS-CoV-2 target nucleic acids are NOT DETECTED.  The SARS-CoV-2 RNA is generally detectable in upper and lower respiratory specimens during the acute phase of infection. The lowest concentration of SARS-CoV-2 viral  copies this assay can detect is 250 copies / mL. A negative result does not preclude SARS-CoV-2 infection and should not be used as the sole basis for treatment or other patient management decisions.  A negative result may occur with improper specimen collection / handling, submission of specimen other than nasopharyngeal swab, presence of viral mutation(s) within the areas targeted by this assay, and inadequate number of viral copies (<250 copies / mL). A negative result must be combined with clinical observations, patient history, and epidemiological information.  Fact Sheet for Patients:   StrictlyIdeas.no  Fact Sheet for Healthcare Providers: BankingDealers.co.za  This test is not yet approved or  cleared by the [place] FDA and has been authorized for detection and/or diagnosis of SARS-CoV-2 by FDA under an Emergency Use Authorization (EUA).  This EUA will remain in effect (meaning this test can be used) for the duration of the COVID-19 declaration under Section 564(b)(1) of the Act, 21 U.S.C. section 360bbb-3(b)(1), unless the authorization is terminated or revoked sooner.  Performed at [facility], [place] [address], [place], [place] [address]   MRSA PCR Screening     Status: None   Collection Time: 03/20/20  4:21 AM   Specimen: Nasopharyngeal  Result Value Ref Range Status   MRSA by PCR NEGATIVE NEGATIVE Final    Comment:        The GeneXpert MRSA Assay (FDA approved for NASAL specimens only), is one component of a comprehensive MRSA colonization surveillance program. It is not intended to diagnose MRSA infection nor to guide or monitor treatment for MRSA infections. Performed at [facility], [place] [address], [place],  [address]   Respiratory Panel by PCR     Status: None   Collection Time: 03/20/20  4:21 AM   Specimen: Nasopharyngeal Swab; Respiratory  Result Value Ref Range Status    Adenovirus NOT DETECTED NOT DETECTED Final   Coronavirus 229E NOT DETECTED NOT DETECTED Final    Comment: (NOTE) The Coronavirus on the Respiratory Panel, DOES NOT test for the novel  Coronavirus (2019 nCoV)    Coronavirus HKU1 NOT DETECTED NOT DETECTED Final   Coronavirus NL63 NOT DETECTED NOT DETECTED Final   Coronavirus OC43 NOT DETECTED NOT DETECTED Final   Metapneumovirus NOT DETECTED NOT DETECTED Final   Rhinovirus / Enterovirus NOT DETECTED NOT DETECTED Final   Influenza A NOT DETECTED NOT DETECTED Final   Influenza B NOT DETECTED NOT DETECTED Final   Parainfluenza Virus 1 NOT DETECTED NOT DETECTED Final   Parainfluenza Virus 2 NOT DETECTED NOT DETECTED Final   Parainfluenza Virus 3 NOT DETECTED NOT DETECTED Final   Parainfluenza Virus 4 NOT  DETECTED NOT DETECTED Final   Respiratory Syncytial Virus NOT DETECTED NOT DETECTED Final   Bordetella pertussis NOT DETECTED NOT DETECTED Final   Chlamydophila pneumoniae NOT DETECTED NOT DETECTED Final   Mycoplasma pneumoniae NOT DETECTED NOT DETECTED Final    Comment: Performed at [facility], [place] [address] [place], [place] [address]    Radiographs and labs were personally reviewed by me.   Bobby Rumpf, MD [facility] [phone] 03/20/2020, 2:19 PM

## 2020-03-20 NOTE — Consult Note (Addendum)
Neurology Consultation Reason for Consult: Head CT hyperdensity Referring Physician: Charlann Lange   CC:   History is obtained from: chart review, primary team and patient  HPI: Trevor Shelton is a 54 y.o. male with past medical history of hypertension and hyperlipidemia recently diagnosed with metastatic multiple myeloma complicated by lytic lesions of the spine, as well as other painful lesions (admitted 8/8-8/26, since which time he has been at rehab)  He has completed radiation of spine lesions due to concern for impending cord compression and dexamethasone taper had been started.  He was started on CyBorD chemotherapy, with first dose on 9/7 (Cyclophosphamide, bortezomib and dexamethasone) with prophylactic acyclovir.   On 9/9 he had a nosebleed and received 2 units of packed red blood cells on 9/10 for hemoglobin of 6.8. At the time his intermittent pancytopenia was felt to be multifactorial due to underlying malignancy, recent epistaxis and recent chemotherapy; Lovenox was discontinued.  On 9/11 he developed high fevers and was admitted to the neuro hospitalist team for further work-up and management.  On the hospitalist initial evaluation the patient was disoriented to time, for which head CT was obtained which demonstrated a lesion in the right frontal lobe concerning for possible hemorrhage.  Neurology was consulted.  Of note, CT chest abdomen pelvis at the same time additionally revealed a pleural-based soft tissue mass concerning for a possible primary lung malignancy in addition to his metastatic melanoma.  Additionally on review of systems he denies headache, though reports some blurry vision that he attributes to fatigue, he denies double vision, he denies photophobia or phonophobia, has not noticed difficulties with his speech, has had lower extremity weakness and incontinence of urine more than bowel, has not noticed rashes or skin changes, has significant shoulder, back, and leg pain, has  not noticed blood in his urine or his stool, does have some decreased sensation in the sacral/genital region  LKW: Unclear given multiple transfers tPA given?: No, due to potential hemorrhage  ICH Score: 0  Time performed: 2 AM GCS: 13-15 is 0 points Infratentorial: No.. If yes, 1 point Volume: <30cc is 0 points  Age: 54 y.o.. >80 is 1 point Intraventricular extension is 1 point A Score of 0 points has a 30 day mortality of 0%. Stroke. 2001 Apr;32(4):891-7.  Past Medical History:  Diagnosis Date  . AKI (acute kidney injury) ([place]) 02/2020  . Hypercalcemia 02/2020  . Hypercholesteremia   . Hypertension    Past Surgical History:  Procedure Laterality Date  . HERNIA REPAIR    . IR FLUORO GUIDE CV LINE RIGHT  02/17/2020  . IR REMOVAL TUN CV CATH W/O FL  03/01/2020  . IR US GUIDE VASC ACCESS RIGHT  02/17/2020    Family History  Problem Relation Age of Onset  . Alzheimer's disease Mother   . Diabetes Father   . Diabetes Brother    Social History:  reports that he has never smoked. He has never used smokeless tobacco. He reports previous alcohol use. He reports previous drug use.  Exam: Current vital signs: BP 134/79 (BP Location: Right Arm)   Pulse (!) 143   Temp 99.8 F (37.7 C) (Oral)   Resp (!) 24   SpO2 98%  Vital signs in last 24 hours: Temp:  [97.6 F (36.4 C)-102.1 F (38.9 C)] 99.8 F (37.7 C) (09/12 0209) Pulse Rate:  [107-143] 143 (09/12 0209) Resp:  [18-32] 24 (09/12 0209) BP: (90-161)/(68-92) 134/79 (09/12 0209) SpO2:  [92 %-98 %] 98 % (09/12  0209)   Physical Exam  Constitutional: Appears ill, but not acutely in distress Psych: Affect flat, cooperative Eyes: No scleral injection HENT: No nuchal rigidity, good range of motion, crusted blood around nares and over lips MSK: no joint deformities.  Cardiovascular: Tachycardic, regular rhythm.  Respiratory: Effort normal, non-labored breathing GI: Protuberant, not tender.   Skin: Warm dry and  intact  Neuro: Conducted with assistance of an interpreter Mental Status: Patient is awake, alert, oriented to person, place, month, year, and situation (states that it is 51 September 2021 when it is 12th) Patient is able to give a clear and coherent history. There is mild confusion, for example when asked him to point to the door and then the ceiling he asks which door, gesturing to the door and the window.  Additionally he states he does not know the name of the thumb.  He is able to name low-frequency objects within language limitations and repeat.  He is able to follow commands. Cranial Nerves: II: Visual Fields are full. Pupils are equal, round, and reactive to light.  III,IV, VI: EOMI without ptosis or diploplia.  V: Facial sensation is symmetric to light touch VII: Facial movement is notable for possible slight right droop with activation VIII: hearing is intact to voice X: Uvula difficult to visualize XI: Head turn is symmetric, shoulder shrug is pain limited XII: tongue is midline without atrophy or fasciculations.  Motor: Tone is quite low in the lower extremities, normal in the uppers.  Bulk is somewhat reduced in the lower extremities.  He is slightly pain limited in the right upper extremity but at least 4+ out of 5 throughout and 5 out of 5 throughout the left upper extremity.  In the lower extremities, his hip flexion is 2/5 bilaterally (hips externally rotated), knee flexion is at least 2/5, knee extension 5 out of 5 Sensory: Sensation is symmetric to light touch and temperature in the arms and legs. Deep Tendon Reflexes: He is actually somewhat hyporeflexic throughout Plantars: Toes are mute bilaterally.  Cerebellar: Finger-to-nose is intact in the left upper extremity, and impacted by pain in the right upper extremity  I have reviewed labs in epic and the results pertinent to this consultation are: Sodium 114 Platelets dropping from 46 to 38 (citrated platelet count in  process) White blood cell count improving from 2.7 to 4 Hemoglobin of 9 INR mildly elevated at 1.4 PT mildly elevated at 16.5 APTT mildly elevated at 44  I have reviewed the images obtained: Thoracic spine MRI from August 2021 with concern for impending compression at T6 Head CT with small hyperdensity in the right frontal lobe CT chest with pleural-based mass  Impression: This is a 54 year old gentleman with minimal previous known medical problems now diagnosed with multiple myeloma complicated by spinal cord injury at T6 status post radiation, now undergoing work-up for fever, pancytopenia and altered mental status, found to have a pleural-based mass and a lesion on head CT.  While the hypodensity on head CT is certainly potentially concerning for bleed, currently his ICH score is 0 and his clinical exam and reassuring.  It is atypical for a bleed to present without headache or other signs of meningeal irritation on history or examination.  Additionally bleeding is typically associated with hypertension and the patient is borderline hypotensive at this time.  I hesitate to treat him with platelets at this time given the primary team is concerned for possible HIT among other etiologies, until diagnosis is clarified especially in  the setting of discovery of a pleural based lung mass   Recommendations:  # Hyperdensity on head CT - MRI brain w/ and w/o contrast to assess for meningitis and bleed stability vs. Mass (CNS metastatic lesions rare and multiple myeloma, but more common in lung malignancy) - Continue to monitor platelet count; including citrated platelet count;  -Goal greater than 30,000 at this time - Further recommendations to follow imaging  # Hyponatremia - Appreciate primary team workup and treatment - Avoid > 6-8 mEq correction per day to avoid risk of central pontine myelinolysis  # Hypertension -Blood pressure goal less than 140 while concerned for potential CNS  bleed  # Pleural Mass # Multiple Myeloma # Sepsis -Appreciate excellent ongoing management per primary team  Lesleigh Noe MD-PhD [facility] [phone]  Addended for charge capture

## 2020-03-20 NOTE — Progress Notes (Signed)
Patient seen and examined personally, I reviewed the chart, history and physical and admission note, done by admitting physician this morning and agree with the same with following addendum.  Please refer to the morning admission note for more detailed plan of care.  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 Discussed w/ RN Pt c/o pain on shoulder, chest. No wounds, stage 2 wound on buttock per RN. On 3l Moran spo2 at 100%  BP stable, HR sinus tachy 120.   ISSUES  Small Intracranial hemorrhage:discussed w/. Neuro- and are planning to repeat CT head in 6 hr, stroke team following and cont to monitor in progressive unit and low threshold for icu transfer. Cont frequent neurochecks.  Severe Sepsis 2/2 Staph Bacteremia 2/2 blood cx +: as evidenced by + blood culture, tachycardia and end organ damage with AKI/ altered mental status. Keep on current iv vancomycin/cefepime- deescalate.?source, immunocompromised in the setting of MM and recent chemo. BP stable sinus tachy 110-120, lactic acid was normal 1.4. resp viral panel/covid are negative, mrsa screen neg.  Acute encephalopathy multifatorial- mri brain w/ ich, small, neuro intact. Also w/ sepsis, hyponatremia. Monitor.  Chest pain- tachycardic.Check troponins. CT- mild to mod atelectasis and/or infiltrate, luyng mass  Left upper lobe with 5.2/1.5 cm pleural based mass- concern for primary lung malignancy per CT.  Hyponatremia sodium at 120. Slow down ivf to 50 ml/hr from 125, as it got corrected rather briskly, nephro eval. Recent Labs  Lab 03/17/20 1000 03/18/20 0847 04/03/2020 1802 03/20/20 0040 03/20/20 0634  NA 127* 123* 114* 114* 120*   AKI- bun/creat 50/1.2, keep on ivf,monitor  Metabolic acidosis- hco3 at 16.monitor  Short term HD in august 2021  Metastatic multiple myeloma complicated by dehiscence of the spine, treated with radiation of spine due to concern for impending cord compression and CyBorD chemo with  prophylactic acyclovir.  New possible primary lung malignancy- oncology on board.  Pancytopenia/Thrombcytopenia- keep plt above 20k per neuro and transfuse if plt < 20k or additional bleeding. Monitor,HIT panel sent. Suspect from [place] to monitor.  T2DM-newly diagnosed in august 2021. 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 No resurrence.   CT chest abd pelvis w/ contrast: Mild to moderate severity bilateral areas of atelectasis and/or infiltrate. 2. 5.2 cm x 1.5 cm pleural based soft tissue mass along the anterior aspect of the left upper lobe. A 1.4 cm pleural based soft tissue nodule is seen within this region on the prior study. These findings are concerning for the presence of a primary lung malignancy. Further evaluation with a nuclear medicine PET/CT is recommended. 3. Findings consistent with multiple myeloma versus diffuse osseous metastasis, with chronic pathologic fractures at the levels of T6 and T9 vertebral bodies. 4. Markedly enlarged prostate gland. 5. 4.0 cm x 3.2 cm fat-containing right scrotal hernia. 6. Mild cardiomegaly. 7. Small hiatal hernia. 8. Aortic atherosclerosis. 9. Small simple cyst within the left kidney.

## 2020-03-20 NOTE — Significant Event (Addendum)
HOSPITAL MEDICINE OVERNIGHT EVENT NOTE    Notified that latest sodium 114, no change from prior sodium.  Will increase rate of IVF to 125 cc/hr considering  concurrent sepsis and volume depletion while continuing to monitor sodium levels closely with serial chemistries (currently ordered Q4hrs).  Per my discussion with Dr. Nicoletta Dress the admitting provider, patient was found to have an intracranial hemorrhage on non contrast head CT.  Case was discussed between Dr. Curly Shores and Dr. Nicoletta Dress.  Dr. Curly Shores recommended stat MRI brain after which recommendations would be provided on further management and whether patient should go to ICU.  Patient remains in progressive for now.  Patient continues to be on broad spectrum antibiotics for sepsis.   Trevor Emerald  MD [facility]   ADDENDUM [phone] 6:45AM)  Evaluated patient at bedside.  Answered questions with spanish interpreter.  Neurologically intact, Alert and oriented.  Clinically, physical findings are reassuring considering possible brain lesion on imaging.  Still awaiting MRI results.  Appreciate pending Neurology recommendations.  Awaiting next sodium level to ensure sodium does not correct too quickly.   [name] [name]

## 2020-03-20 NOTE — Progress Notes (Addendum)
CRITICAL VALUE ALERT  Critical Value:  Sodium 114  Date & Time Notied:  9/12 @ 0045  Provider Notified: Cyd Silence, MD  Orders Received/Actions taken: See [facility]   Elaina Hoops, RN

## 2020-03-20 NOTE — Progress Notes (Signed)
OT Cancellation Note  Patient Details Name: Trevor Shelton MRN: [id number] DOB: 04-11-1966   Cancelled Treatment:    Reason Eval/Treat Not Completed: Medical issues which prohibited therapy. Pt found to have new ICH. Will await further plans from MD's prior to OT evaluation. Thank you.  Michel Bickers, OTR/L [facility] [phone]  Francesca Jewett 03/20/2020, 2:43 PM

## 2020-03-20 NOTE — Progress Notes (Signed)
Pharmacy Antibiotic Note  Trevor Shelton is a 54 y.o. male admitted on 03/18/2020 with fevers. Pt has hx MM s/p chemotherapy, now found to have MSSA bacteremia.  Pharmacy has been consulted for cefazolin dosing. SCr remains stable ~1.2.   Plan: Cefazolin 2g IV q8h     Temp (24hrs), Avg:100.8 F (38.2 C), Min:99.2 F (37.3 C), Max:102.3 F (39.1 C)  Recent Labs  Lab 03/17/20 1000 03/18/20 0847 03/18/20 0930 03/24/2020 0517 03/16/2020 1802 03/20/20 0040 03/20/20 0634 03/20/20 0904 03/20/20 1402  WBC 3.5*  --  2.7* 2.9* 4.6 4.4  --   --   --   CREATININE 1.25*   < >  --   --  1.06 1.14 1.27* 1.24 1.18  LATICACIDVEN  --   --   --   --   --  1.4  --   --   --    < > = values in this interval not displayed.    Estimated Creatinine Clearance: 56.3 mL/min (by C-G formula based on SCr of 1.18 mg/dL).    No Known Allergies  Antimicrobials this admission: Cefazolin 9/12 >> Cefepime 9/11 >> 9/12 Vanc 9/11 >> 9/12 Fluconazole 9/7 >> (9/14) Acyclovir ppx   Microbiology results: 9/11 BCID 2/2 S. Aureus, no resistance genes detected 9/11 MRSA PCR neg 9/11 Resp panel neg  Thank you for allowing pharmacy to be a part of this patient's care.  Einar Grad 03/20/2020 2:58 PM

## 2020-03-20 NOTE — Progress Notes (Signed)
PHARMACY - PHYSICIAN COMMUNICATION CRITICAL VALUE ALERT - BLOOD CULTURE IDENTIFICATION (BCID)  Trevor Shelton is an 54 y.o. male who presented to [facility] initially on 02/14/20 with a chief complaint of generalized weakness. The CT-guided bone marrow biopsy on 02/19/20 showed positive for neoplasm kappa light chain consistent with multiple myeloma. He received 10 sessions of radiation. He was then admitted to inpatient rehab on 8/26 and underwent chemotherapy on 03/15/20. Patient spiked fevers 9/11 (Tmax 102.1) and was readmitted to inpatient. WBC increased from 2.9 on 9/10 to 4.6 on 9/11, likely relative leukocytosis for him. Empiric vancomycin + cefepime was started 9/11 overnight.   Assessment:  BCID 2/2 Staphylococcus aureus (unknown source), no resistance genes detected.   Name of physician (or Provider) Contacted: Antonieta Pert, MD  Current antibiotics: vancomycin 750 mg IV q12h, cefepime 2 g IV q8h  Changes to prescribed antibiotics recommended: Discontinue vancomycin and de-escalate cefepime to cefazolin.  - Will follow up sensitivities and plans to de-escalate.   Recommendations declined by provider due to clinical deterioration and immunocompromised status in the setting of MM and recent chemotherapy, would like to continue vancomycin and cefepime for today.   Results for orders placed or performed during the hospital encounter of 03/03/20  Blood Culture ID Panel (Reflexed) (Collected: 03/23/2020  6:02 PM)  Result Value Ref Range   Enterococcus faecalis NOT DETECTED NOT DETECTED   Enterococcus Faecium NOT DETECTED NOT DETECTED   Listeria monocytogenes NOT DETECTED NOT DETECTED   Staphylococcus species DETECTED (A) NOT DETECTED   Staphylococcus aureus (BCID) DETECTED (A) NOT DETECTED   Staphylococcus epidermidis NOT DETECTED NOT DETECTED   Staphylococcus lugdunensis NOT DETECTED NOT DETECTED   Streptococcus species NOT DETECTED NOT DETECTED   Streptococcus agalactiae NOT DETECTED NOT  DETECTED   Streptococcus pneumoniae NOT DETECTED NOT DETECTED   Streptococcus pyogenes NOT DETECTED NOT DETECTED   A.calcoaceticus-baumannii NOT DETECTED NOT DETECTED   Bacteroides fragilis NOT DETECTED NOT DETECTED   Enterobacterales NOT DETECTED NOT DETECTED   Enterobacter cloacae complex NOT DETECTED NOT DETECTED   Escherichia coli NOT DETECTED NOT DETECTED   Klebsiella aerogenes NOT DETECTED NOT DETECTED   Klebsiella oxytoca NOT DETECTED NOT DETECTED   Klebsiella pneumoniae NOT DETECTED NOT DETECTED   Proteus species NOT DETECTED NOT DETECTED   Salmonella species NOT DETECTED NOT DETECTED   Serratia marcescens NOT DETECTED NOT DETECTED   Haemophilus influenzae NOT DETECTED NOT DETECTED   Neisseria meningitidis NOT DETECTED NOT DETECTED   Pseudomonas aeruginosa NOT DETECTED NOT DETECTED   Stenotrophomonas maltophilia NOT DETECTED NOT DETECTED   Candida albicans NOT DETECTED NOT DETECTED   Candida auris NOT DETECTED NOT DETECTED   Candida glabrata NOT DETECTED NOT DETECTED   Candida krusei NOT DETECTED NOT DETECTED   Candida parapsilosis NOT DETECTED NOT DETECTED   Candida tropicalis NOT DETECTED NOT DETECTED   Cryptococcus neoformans/gattii NOT DETECTED NOT DETECTED   Meth resistant mecA/C and MREJ NOT DETECTED NOT [place], PharmD PGY1 Pharmacy Resident 03/20/2020 8:38 AM  Please check [email or web address] for unit-specific pharmacy phone numbers.

## 2020-03-20 NOTE — Progress Notes (Addendum)
Trevor Shelton   DOB:May 12, 1966   VH#:[id number]   WUX#:[id number]  Hem/Onc follow up   Subjective: Pt is known to our service, under my partner Dr. Libby Maw care for MM. He was in [facility] inpatient rehab, and developed high fever, shortness of breath yesterday.  He was transferred to medical service last night.  CT angiogram showed intracranial hemorrhage.  He was evaluated by neurology service.  When I saw him this morning, he was awake, alert, oriented, does not complain any headaches, mild shortness of breath, he is on nasal cannula oxygen.  He was still febrile this morning, and received Tylenol.  He has mild lip bleeding, no other active bleeding.  I used [employer] online interpreter service to communicate with patient and his family.   Objective:  Vitals:   03/20/20 1055 03/20/20 1058  BP:    Pulse:  (!) 119  Resp:    Temp: (!) 101.2 F (38.4 C)   SpO2:      There is no height or weight on file to calculate BMI. No intake or output data in the 24 hours ending 03/20/20 1132   Sclerae unicteric  Oropharynx clear, (+) old bleeding scar on lips, no active bleeding in mouth  No peripheral adenopathy  Lungs clear -- no rales or rhonchi  Heart regular rate and rhythm  Abdomen benign, non tender   Neuro: Alert and oriented    CBG (last 3)  Recent Labs    04/04/2020 2148 03/15/2020 2222 03/20/20 0826  GLUCAP 234* 228* 138*     Labs:  Urine Studies No results for input(s): UHGB, CRYS in the last 72 hours.  Invalid input(s): UACOL, UAPR, USPG, UPH, UTP, UGL, UKET, UBIL, UNIT, UROB, ULEU, UEPI, UWBC, [place], [place], [place], [place], [place]  Basic Metabolic Panel: Recent Labs  Lab 03/18/20 0847 03/18/20 0847 03/16/2020 1802 03/29/2020 1802 03/20/20 0040 03/20/20 0040 03/20/20 0634 03/20/20 0904  NA 123*  --  114*  --  114*  --  120* 121*  K 4.1   < > 4.6   < > 4.0   < > 3.8 3.7  CL 93*  --  86*  --  87*  --  93* 93*  CO2 19*  --  19*  --  17*  --  16* 17*  GLUCOSE 159*  --  227*  --   213*  --  158* 132*  BUN 33*  --  47*  --  50*  --  50* 49*  CREATININE 0.91  --  1.06  --  1.14  --  1.27* 1.24  CALCIUM 8.3*  --  8.3*  --  7.9*  --  8.1* 7.9*   < > = values in this interval not displayed.   GFR Estimated Creatinine Clearance: 53.5 mL/min (by C-G formula based on SCr of 1.24 mg/dL). Liver Function Tests: Recent Labs  Lab 03/14/20 0611 04/04/2020 1802  AST 36 38  ALT 30 30  ALKPHOS 88 94  BILITOT 0.6 0.6  PROT 6.5 6.3*  ALBUMIN 2.1* 1.9*   No results for input(s): LIPASE, AMYLASE in the last 168 hours. No results for input(s): AMMONIA in the last 168 hours. Coagulation profile Recent Labs  Lab 03/20/20 0201  INR 1.4*    CBC: Recent Labs  Lab 03/15/20 0810 03/15/20 0810 03/17/20 1000 03/18/20 0930 04/06/2020 0517 03/17/2020 1802 03/20/20 0040  WBC 4.2  4.2   < > 3.5* 2.7* 2.9* 4.6 4.4  NEUTROABS 3.4  --  2.6 2.1 2.1  --  3.5  HGB 8.0*  8.1*   < > 7.7* 6.8* 11.1* 10.0* 9.4*  HCT 23.1*  23.2*   < > 22.4* 19.7* 31.5* 28.8* 26.1*  MCV 83.7  84.4   < > 86.5 83.5 84.7 83.0 83.1  PLT PLATELET CLUMPS NOTED ON SMEAR, UNABLE TO ESTIMATE  76*   < > 75* 49* 47* 46* 38*   < > = values in this interval not displayed.   Cardiac Enzymes: No results for input(s): CKTOTAL, CKMB, CKMBINDEX, TROPONINI in the last 168 hours. BNP: Invalid input(s): POCBNP CBG: Recent Labs  Lab 03/14/2020 1143 03/22/2020 1649 03/20/2020 2148 03/24/2020 2222 03/20/20 0826  GLUCAP 188* 209* 234* 228* 138*   D-Dimer No results for input(s): DDIMER in the last 72 hours. Hgb A1c No results for input(s): HGBA1C in the last 72 hours. Lipid Profile No results for input(s): CHOL, HDL, LDLCALC, TRIG, CHOLHDL, LDLDIRECT in the last 72 hours. Thyroid function studies No results for input(s): TSH, T4TOTAL, T3FREE, THYROIDAB in the last 72 hours.  Invalid input(s): FREET3 Anemia work up Recent Labs    03/20/20 0634  RETICCTPCT 2.1   Microbiology Recent Results (from the past 240  hour(s))  Culture, blood (routine x 2)     Status: None (Preliminary result)   Collection Time: 03/20/2020  6:02 PM   Specimen: BLOOD  Result Value Ref Range Status   Specimen Description BLOOD RIGHT ANTECUBITAL  Final   Special Requests   Final    BOTTLES DRAWN AEROBIC AND ANAEROBIC Blood Culture adequate volume   Culture  Setup Time   Final    GRAM POSITIVE COCCI IN CLUSTERS IN BOTH AEROBIC AND ANAEROBIC BOTTLES Organism ID to follow CRITICAL RESULT CALLED TO, READ BACK BY AND VERIFIED WITH: Barth Kirks PHARMD, AT 0740 03/20/20 BY Rush Landmark Performed at [facility], [place] [address], [place], [place] [address]    Culture GRAM POSITIVE COCCI  Final   Report Status PENDING  Incomplete  Blood Culture ID Panel (Reflexed)     Status: Abnormal   Collection Time: 03/22/2020  6:02 PM  Result Value Ref Range Status   Enterococcus faecalis NOT DETECTED NOT DETECTED Final   Enterococcus Faecium NOT DETECTED NOT DETECTED Final   Listeria monocytogenes NOT DETECTED NOT DETECTED Final   Staphylococcus species DETECTED (A) NOT DETECTED Final    Comment: CRITICAL RESULT CALLED TO, READ BACK BY AND VERIFIED WITH: Barth Kirks PHARMD, AT 0740 03/20/20 BY D. VANHOOK    Staphylococcus aureus (BCID) DETECTED (A) NOT DETECTED Final    Comment: CRITICAL RESULT CALLED TO, READ BACK BY AND VERIFIED WITH: Barth Kirks PHARMD, AT 0740 03/20/20 BY D. VANHOOK    Staphylococcus epidermidis NOT DETECTED NOT DETECTED Final   Staphylococcus lugdunensis NOT DETECTED NOT DETECTED Final   Streptococcus species NOT DETECTED NOT DETECTED Final   Streptococcus agalactiae NOT DETECTED NOT DETECTED Final   Streptococcus pneumoniae NOT DETECTED NOT DETECTED Final   Streptococcus pyogenes NOT DETECTED NOT DETECTED Final   A.calcoaceticus-baumannii NOT DETECTED NOT DETECTED Final   Bacteroides fragilis NOT DETECTED NOT DETECTED Final   Enterobacterales NOT DETECTED NOT DETECTED Final   Enterobacter cloacae complex NOT  DETECTED NOT DETECTED Final   Escherichia coli NOT DETECTED NOT DETECTED Final   Klebsiella aerogenes NOT DETECTED NOT DETECTED Final   Klebsiella oxytoca NOT DETECTED NOT DETECTED Final   Klebsiella pneumoniae NOT DETECTED NOT DETECTED Final   Proteus species NOT DETECTED NOT DETECTED Final   Salmonella species NOT DETECTED  NOT DETECTED Final   Serratia marcescens NOT DETECTED NOT DETECTED Final   Haemophilus influenzae NOT DETECTED NOT DETECTED Final   Neisseria meningitidis NOT DETECTED NOT DETECTED Final   Pseudomonas aeruginosa NOT DETECTED NOT DETECTED Final   Stenotrophomonas maltophilia NOT DETECTED NOT DETECTED Final   Candida albicans NOT DETECTED NOT DETECTED Final   Candida auris NOT DETECTED NOT DETECTED Final   Candida glabrata NOT DETECTED NOT DETECTED Final   Candida krusei NOT DETECTED NOT DETECTED Final   Candida parapsilosis NOT DETECTED NOT DETECTED Final   Candida tropicalis NOT DETECTED NOT DETECTED Final   Cryptococcus neoformans/gattii NOT DETECTED NOT DETECTED Final   Meth resistant mecA/C and MREJ NOT DETECTED NOT DETECTED Final    Comment: Performed at [facility], [place] [address], [place], [place] [address]  SARS Coronavirus 2 by RT PCR (hospital order, performed in [facility] hospital lab) Nasopharyngeal Nasopharyngeal Swab     Status: None   Collection Time: 03/13/2020  8:37 PM   Specimen: Nasopharyngeal Swab  Result Value Ref Range Status   SARS Coronavirus 2 NEGATIVE NEGATIVE Final    Comment: (NOTE) SARS-CoV-2 target nucleic acids are NOT DETECTED.  The SARS-CoV-2 RNA is generally detectable in upper and lower respiratory specimens during the acute phase of infection. The lowest concentration of SARS-CoV-2 viral copies this assay can detect is 250 copies / mL. A negative result does not preclude SARS-CoV-2 infection and should not be used as the sole basis for treatment or other patient management decisions.  A negative result may occur  with improper specimen collection / handling, submission of specimen other than nasopharyngeal swab, presence of viral mutation(s) within the areas targeted by this assay, and inadequate number of viral copies (<250 copies / mL). A negative result must be combined with clinical observations, patient history, and epidemiological information.  Fact Sheet for Patients:   StrictlyIdeas.no  Fact Sheet for Healthcare Providers: BankingDealers.co.za  This test is not yet approved or  cleared by the [place] FDA and has been authorized for detection and/or diagnosis of SARS-CoV-2 by FDA under an Emergency Use Authorization (EUA).  This EUA will remain in effect (meaning this test can be used) for the duration of the COVID-19 declaration under Section 564(b)(1) of the Act, 21 U.S.C. section 360bbb-3(b)(1), unless the authorization is terminated or revoked sooner.  Performed at [facility], [place] [address] [place], [place] [address]   MRSA PCR Screening     Status: None   Collection Time: 03/20/20  4:21 AM   Specimen: Nasopharyngeal  Result Value Ref Range Status   MRSA by PCR NEGATIVE NEGATIVE Final    Comment:        The GeneXpert MRSA Assay (FDA approved for NASAL specimens only), is one component of a comprehensive MRSA colonization surveillance program. It is not intended to diagnose MRSA infection nor to guide or monitor treatment for MRSA infections. Performed at [facility], [place] [address], [place], [place] [address]   Respiratory Panel by PCR     Status: None   Collection Time: 03/20/20  4:21 AM   Specimen: Nasopharyngeal Swab; Respiratory  Result Value Ref Range Status   Adenovirus NOT DETECTED NOT DETECTED Final   Coronavirus 229E NOT DETECTED NOT DETECTED Final    Comment: (NOTE) The Coronavirus on the Respiratory Panel, DOES NOT test for the novel  Coronavirus (2019 nCoV)    Coronavirus HKU1 NOT  DETECTED NOT DETECTED Final   Coronavirus NL63  NOT DETECTED NOT DETECTED Final   Coronavirus OC43 NOT DETECTED NOT DETECTED Final   Metapneumovirus NOT DETECTED NOT DETECTED Final   Rhinovirus / Enterovirus NOT DETECTED NOT DETECTED Final   Influenza A NOT DETECTED NOT DETECTED Final   Influenza B NOT DETECTED NOT DETECTED Final   Parainfluenza Virus 1 NOT DETECTED NOT DETECTED Final   Parainfluenza Virus 2 NOT DETECTED NOT DETECTED Final   Parainfluenza Virus 3 NOT DETECTED NOT DETECTED Final   Parainfluenza Virus 4 NOT DETECTED NOT DETECTED Final   Respiratory Syncytial Virus NOT DETECTED NOT DETECTED Final   Bordetella pertussis NOT DETECTED NOT DETECTED Final   Chlamydophila pneumoniae NOT DETECTED NOT DETECTED Final   Mycoplasma pneumoniae NOT DETECTED NOT DETECTED Final    Comment: Performed at [facility], [place] [address] [place], [place] [address]      Studies:  DG Chest 2 View  Result Date: 03/27/2020 CLINICAL DATA:  Shortness of breath and fever EXAM: CHEST - 2 VIEW COMPARISON:  February 25, 2020 FINDINGS: There is mild atelectatic change in the left mid lung and bibasilar regions. There is no edema or airspace opacity. Heart is upper normal in size with pulmonary vascularity normal. No adenopathy. No bone lesions. IMPRESSION: Areas of patchy atelectasis bilaterally. No edema or airspace opacity. Cardiac silhouette within normal limits. Electronically Signed   By: Lowella Grip III M.D.   On: 03/13/2020 15:21   CT HEAD WO CONTRAST  Result Date: 03/20/2020 CLINICAL DATA:  Initial evaluation for acute altered mental status. EXAM: CT HEAD WITHOUT CONTRAST TECHNIQUE: Contiguous axial images were obtained from the base of the skull through the vertex without intravenous contrast. COMPARISON:  None available. FINDINGS: Brain: Cerebral volume within normal limits for age. There is a somewhat focal lobulated hyperdensity immediately subjacent to the right frontal calvarium  (series 3, image 18) finding also seen on coronal sequence (series 5, images 21-23) and sagittal sequence (series 6, image 23). Lesion measures up to 2.1 cm in greatest dimension on sagittal sequence. While this area is often prone to artifact, finding is suspicious for a small bleed. Finding is favored to be intra-axial in nature, although a small extra-axial component may be present as well. No significant mass effect or edema. No other acute intracranial hemorrhage. No acute large vessel territory infarct. No mass lesion, midline shift, or mass effect. No hydrocephalus. Vascular: No hyperdense vessel. Skull: Scalp soft tissues demonstrate no acute finding. Multiple scattered lucent lesion seen throughout the calvarium and visualized upper cervical spine. Note made of a prominent 1.7 cm lytic lesion within the clivus. Findings are nonspecific, but concerning for possible Mets or myeloma. Sinuses/Orbits: Globes and orbital soft tissues within normal limits. Paranasal sinuses are clear. Trace left mastoid effusion noted. Other: None. IMPRESSION: 1. Small lobulated hyperdensity immediately subjacent to the right frontal calvarium as above. Highly suspicious for a small bleed. No significant mass effect or edema. 2. Multiple scattered lucent lesions throughout the calvarium and visualized upper cervical spine, consistent with history of multiple myeloma. 3. No other acute intracranial abnormality. Critical Value/emergent results were called by telephone at the time of interpretation on 03/20/2020 at 1:12 am to provider NA LI , who verbally acknowledged these results. Electronically Signed   By: Jeannine Boga M.D.   On: 03/20/2020 01:20   MR BRAIN W WO CONTRAST  Result Date: 03/20/2020 CLINICAL DATA:  Initial evaluation for brain mass or lesion. EXAM: MRI HEAD WITHOUT AND WITH CONTRAST TECHNIQUE: Multiplanar, multiecho pulse sequences  of the brain and surrounding structures were obtained without and with  intravenous contrast. CONTRAST:  57m GADAVIST GADOBUTROL 1 MMOL/ML IV SOLN COMPARISON:  Prior head CT from earlier the same day. FINDINGS: Brain: Cerebral volume within normal limits for age. No significant cerebral white matter disease or other focal parenchymal signal abnormality. Abnormal T1/FLAIR hyperintensity involving the anterior inferior right frontal convexity, consistent with acute intracranial hemorrhage, corresponding with abnormality on prior CT (series 11, images 10-14). This appears to be both intraparenchymal and extra-axial, and is favored to have initially represented a small parenchymal bleed that developed subdural/extra-axial extension. Size of this hemorrhage measures approximately 1.8 cm in greatest dimension on sagittal sequences. No associated edema or significant regional mass effect. No definite underlying lesion. Slight asymmetric smooth dural thickening and enhancement seen elsewhere about the right cerebral convexity, favored to be reactive. Consideration given to possible myelomatous involvement of the dura, although this would be expected to be more nodular and irregular in appearance. No other acute intracranial hemorrhage identified. Single punctate focus of susceptibility artifact noted at the right frontal centrum semi ovale, likely a small chronic microhemorrhage, of doubtful significance in isolation. No evidence for acute or subacute infarct. No encephalomalacia to suggest chronic cortical infarction. No appreciable intra-axial mass lesion. Ventricles normal size without hydrocephalus. No other extra-axial fluid collection. 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 Midline structures intact. No other abnormal enhancement. Vascular: Major intracranial vascular flow voids are maintained. Skull and upper cervical spine: Craniocervical junction within normal limits. Extensive and innumerable marrow replacing lesions seen throughout the calvarium, skull base, and visualized upper  spine, compatible with history of multiple myeloma. Most prominent of these lesions positioned in the clivus and measures approximately 2 cm in size. No other scalp soft tissue abnormality. Sinuses/Orbits: Globes and orbital soft tissues within normal limits. Paranasal sinuses are largely clear. No significant mastoid effusion. Inner ear structures grossly normal. Other: None. IMPRESSION: 1. Acute intracranial hemorrhage involving the anterior-inferior right frontal convexity, stable from prior head CT. No associated edema or significant mass effect. 2. No other acute intracranial abnormality. 3. Innumerable marrow replacing lesions throughout the calvarium, skull base, and visualized upper spine, compatible with history of multiple myeloma. Electronically Signed   By: BJeannine BogaM.D.   On: 03/20/2020 07:35   CT CHEST ABDOMEN PELVIS W CONTRAST  Result Date: 03/20/2020 CLINICAL DATA:  Abdominal distension. EXAM: CT CHEST, ABDOMEN, AND PELVIS WITH CONTRAST TECHNIQUE: Multidetector CT imaging of the chest, abdomen and pelvis was performed following the standard protocol during bolus administration of intravenous contrast. CONTRAST:  1070mOMNIPAQUE IOHEXOL 300 MG/ML  SOLN COMPARISON:  February 14, 2020 FINDINGS: CT CHEST FINDINGS Cardiovascular: No significant vascular findings. There is mild cardiomegaly. No pericardial effusion. Mediastinum/Nodes: No enlarged mediastinal, hilar, or axillary lymph nodes. Thyroid gland, trachea, and esophagus demonstrate no significant findings. Lungs/Pleura: Mild to moderate severity areas of atelectasis and/or infiltrate are seen within the inferior aspect of the left upper lobe posterior aspect of the superior portion of the right lower lobe and posterior aspect of the bilateral lung bases. There is a very small left pleural effusion. A 5.2 cm x 1.5 cm pleural based soft tissue mass is seen along the anterior aspect of the left upper lobe (axial CT image 22, CT series  number 3). A 1.4 cm pleural based soft tissue nodule is seen within this region on the prior study. Musculoskeletal: Chronic anterior second, third and fourth left rib fractures are noted. A chronic fracture of the  right scapula is also seen. Numerous lytic lesions are seen throughout the thoracic spine with a chronic pathologic fractures noted at the levels of T6 and T9 vertebral bodies. These are seen on the prior study. CT ABDOMEN PELVIS FINDINGS Hepatobiliary: A 9 mm cystic appearing area is seen within the medial aspect of the right lobe of the liver. No gallstones, gallbladder wall thickening, or biliary dilatation. Pancreas: Unremarkable. No pancreatic ductal dilatation or surrounding inflammatory changes. Spleen: Normal in size without focal abnormality. Adrenals/Urinary Tract: Adrenal glands are unremarkable. Kidneys are normal in size, without renal or hydronephrosis. A 1.3 cm simple cyst is seen within the anteromedial aspect of the upper left kidney. Bladder is unremarkable. Stomach/Bowel: There is a small hiatal hernia. Appendix appears normal. No evidence of bowel wall thickening, distention, or inflammatory changes. A moderate amount of stool is seen within the distal sigmoid colon and rectum. Vascular/Lymphatic: No significant vascular findings are present. No enlarged abdominal or pelvic lymph nodes. Reproductive: The prostate gland is markedly enlarged. Other: A 4.0 cm x 3.2 cm fat-containing right scrotal hernia is noted. No abdominopelvic ascites. Musculoskeletal: Numerous lytic lesions are seen throughout the lumbar spine and pelvis. IMPRESSION: 1. Mild to moderate severity bilateral areas of atelectasis and/or infiltrate. 2. 5.2 cm x 1.5 cm pleural based soft tissue mass along the anterior aspect of the left upper lobe. A 1.4 cm pleural based soft tissue nodule is seen within this region on the prior study. These findings are concerning for the presence of a primary lung malignancy. Further  evaluation with a nuclear medicine PET/CT is recommended. 3. Findings consistent with multiple myeloma versus diffuse osseous metastasis, with chronic pathologic fractures at the levels of T6 and T9 vertebral bodies. 4. Markedly enlarged prostate gland. 5. 4.0 cm x 3.2 cm fat-containing right scrotal hernia. 6. Mild cardiomegaly. 7. Small hiatal hernia. 8. Aortic atherosclerosis. 9. Small simple cyst within the left kidney. Aortic Atherosclerosis (ICD10-I70.0). Electronically Signed   By: Virgina Norfolk M.D.   On: 03/20/2020 01:09   DG Abd Portable 1V  Result Date: 03/30/2020 CLINICAL DATA:  Fever EXAM: X-RAY ABDOMEN 1 VIEW COMPARISON:  03/13/2020 FINDINGS: The bowel gas pattern is nonspecific with gaseous distention of loops of small bowel and colon scattered throughout the abdomen. There is an above average amount of stool throughout the ascending colon. There is no definite pneumatosis or free air. No radiopaque kidney stones. IMPRESSION: 1. Nonspecific bowel gas pattern with gaseous distention of loops of small bowel and colon scattered throughout the abdomen. 2. Above average amount of stool throughout the ascending colon. This is not significantly changed from 03/13/2020. Electronically Signed   By: Constance Holster M.D.   On: 03/15/2020 17:46    Assessment: 54 y.o.   1. Fever, likely infectious, ? Pneumonia  2.  Acute small intracranial bleeding, likely secondary to thrombocytopenia 3. Newly diagnosed multiple myeloma, with thoracic spine fracture, status post radiation, and first dose chemotherapy Velcade and dexamethasone on 03/15/20 4. Worsening thrombocytopenia, secondary to multiple myeloma and Velcade 5.  Anemia secondary to multiple myeloma and recent the severe epistaxis 6. New left pleural-based mass, concerning for malignancy 7. Hyponatremia  8. History of AKI secondary to MM  9. DM 10. HTN   Plan:  -His worsening thrombocytopenia is likely related to the underlying multiple  myeloma, and Velcade treatment.  I do not think this is HIT, given the patten of his platelet count changing (fluctuating and ongoing), I will rule out DIC, and hemolysis.  I checked reticulocyte count this morning which was mildly elevated, this is against hemolysis.  Haptoglobin is pending.  I will review his peripheral smear also. -I would consider platelet transfusion if platelet drops below 20K, or if he has severe active clinical bleeding.  I spoke with Dr. Leonie Man, he does not think patient need platelet transfusion for his small intracranial bleeding -I have reviewed his CT scan image personally, his left pleural-based mass is concerning for malignancy, he did smoke 1 pack a day for 13 years in the past.  If this is malignancy, I think it is unlikely plasmacytoma, also primary lung cancer is also a possibility.  When his infection is controlled, and platelet counts improved, we would need IR biopsy of this mass later this week.  -continue close monitoring, CBC twice daily  -I agree with broad antibiotics, will defer this to primary team.  Cultures are pending. -I answered all patient's and his family member's questions  -we will f/u tomorrow    Truitt Merle, MD 03/20/2020  11:32 AM   Addendum  I reviewed his peripheral smear, which is significant for thrombocytopenia, I only see a few schistocytes, not significant.   If pt needs blood transfusion, please consider irradiated blood products, use single donor plt if possible, due to the potential bone marrow transplant he may need. Thanks   Truitt Merle  03/20/2020

## 2020-03-20 NOTE — Progress Notes (Signed)
Critical result called by lab - Plts = 28 . Dr Lupita Leash notified.

## 2020-03-20 NOTE — Consult Note (Signed)
Renal Service Consult Note [facility]  [place] Grimsley 03/20/2020 Sol Blazing Requesting Physician:  Dr. Antonieta Pert  Reason for Consult:  Hyponatremia HPI: The patient is a 54 y.o. year-old w/ hx of HTN, HL presented on 02/14/20 w/ back pain, L lower rib pain and constipation. Labs showed ^^Ca++, AKI, Na 125. CT showed diffuse lytic lesions c/w myeloma. Pt was admitted and was diagnosed w/ kappa LC multiple myeloma, admitted 8/08- 8/26.  He received XRT to spinal cord x 10 sessions. Renal saw him for AKI, creat admit was 4.9 and improved to 1.53, had 1-2 HD sessions. Had some low Na levels attributed to pseudohyponatremia from paraproteinemia +/- vol depletion or SIADH from some N/V he was having. He was dc'd and admitted to CIR on 8/26.  He had chemorx on 9/7 w/ next round scheduled for 9/14. He had nosebleed on 9/9 and got 2u prbc's.  On 9/11 pt spiked temp to 102 deg. Blood cx's from yesterday are growing Staph aureus, 4/4.  Labs yest showed Na 114, creat 1.0 and BUN 47, KUB ++ stool, UA neg.  UNa 11, Uosm 568.  Pt got IVF's at 50 cc/hr and Na improved to 121 this am.  Asked to see for hyponatremia.   Pt seen in his room. Looking SOB, states he is SOB , wife says started yesterday. No cough or fevers, no abd pain or n/v/d.     ROS  denies CP  no joint pain   no HA  no blurry vision  no rash  no diarrhea  no nausea/ vomiting     Past Medical History  Past Medical History:  Diagnosis Date  . AKI (acute kidney injury) ([place]) 02/2020  . Hypercalcemia 02/2020  . Hypercholesteremia   . Hypertension    Past Surgical History  Past Surgical History:  Procedure Laterality Date  . HERNIA REPAIR    . IR FLUORO GUIDE CV LINE RIGHT  02/17/2020  . IR REMOVAL TUN CV CATH W/O FL  03/01/2020  . IR US GUIDE VASC ACCESS RIGHT  02/17/2020   Family History  Family History  Problem Relation Age of Onset  . Alzheimer's disease Mother   . Diabetes Father   . Diabetes Brother     Social History  reports that he has never smoked. He has never used smokeless tobacco. He reports previous alcohol use. He reports previous drug use. Allergies No Known Allergies Home medications Prior to Admission medications   Medication Sig Start Date End Date Taking? Authorizing Provider  amLODipine (NORVASC) 10 MG tablet Take 1 tablet (10 mg total) by mouth daily. 03/04/20   Shelly Coss, MD  carvedilol (COREG) 3.125 MG tablet Take 1 tablet (3.125 mg total) by mouth 2 (two) times daily with a meal. 03/03/20   Shelly Coss, MD  dexamethasone (DECADRON) 4 MG tablet Take 1 tablet (4 mg total) by mouth 3 (three) times daily. 03/03/20 03/03/21  Shelly Coss, MD  feeding supplement, ENSURE ENLIVE, (ENSURE ENLIVE) LIQD Take 237 mLs by mouth 3 (three) times daily between meals. 03/03/20   Shelly Coss, MD  insulin aspart (NOVOLOG) 100 UNIT/ML injection Inject 0-9 Units into the skin 3 (three) times daily with meals. 03/03/20   Shelly Coss, MD  insulin aspart (NOVOLOG) 100 UNIT/ML injection Inject 3 Units into the skin 3 (three) times daily with meals. 03/03/20   Shelly Coss, MD  insulin glargine (LANTUS) 100 UNIT/ML injection Inject 0.1 mLs (10 Units total) into the skin at bedtime. 03/03/20  Shelly Coss, MD  lidocaine (LIDODERM) 5 % Place 1 patch onto the skin daily. Remove & Discard patch within 12 hours or as directed by MD 03/03/20   Shelly Coss, MD  lidocaine (XYLOCAINE) 2 % solution Use as directed 15 mLs in the mouth or throat every 6 (six) hours as needed for mouth pain. 03/03/20   Shelly Coss, MD  oxyCODONE (OXY IR/ROXICODONE) 5 MG immediate release tablet Take 1 tablet (5 mg total) by mouth every 4 (four) hours as needed for moderate pain. 03/03/20   Shelly Coss, MD  pantoprazole (PROTONIX) 40 MG tablet Take 1 tablet (40 mg total) by mouth daily. 03/04/20   Shelly Coss, MD  polyethylene glycol (MIRALAX / GLYCOLAX) 17 g packet Take 17 g by mouth daily as needed  for moderate constipation. 03/03/20   Shelly Coss, MD  simvastatin (ZOCOR) 20 MG tablet Take 20 mg by mouth daily. 02/01/20   [provider]     Vitals:   03/20/20 0350 03/20/20 0829 03/20/20 1055 03/20/20 1058  BP: 134/79 100/61    Pulse: (!) 143 (!) 115  (!) 119  Resp: (!) 24     Temp: 99.8 F (37.7 C) (!) 102.3 F (39.1 C) (!) 101.2 F (38.4 C)   TempSrc: Oral Oral Oral   SpO2: 98% 99%     Exam Gen pt lying flat, breathing looks rapid and uncomfortable, not in sig distress No rash, cyanosis or gangrene Sclera anicteric, throat clear, moist  No jvd or bruits Chest dec'd bilat bases, no bronchial BS or wheezing RRR no rub/ murmur, + gallop S4 vs S3 Abd distended, tympanitic, nontender, doubt ascites GU normal male w/ foley draining clear yellow urine  MS no joint effusions or deformity Ext no leg or UE edema, no wounds or ulcers Neuro is tired, nonfocal, alert    CXR 02/25/20 - IMPRESSION: 1. Dense left lower lobe consolidation, favor atelectasis. 2. Diffuse lytic lesions throughout the visualized bony structures, consistent with multiple myeloma.    CXR 04/06/2020 - IMPRESSION: Areas of patchy atelectasis bilaterally. No edema or airspace opacity. Cardiac silhouette within normal limits    CT abd chest 9/12 +contrast > IMPRESSION: 1. Mild to moderate severity bilateral areas of atelectasis and/or infiltrate. 2. 5.2 cm x 1.5 cm pleural based soft tissue mass along the anterior aspect of the left upper lobe. A 1.4 cm pleural based soft tissue nodule is seen within this region on the prior study. These findings are concerning for the presence of a primary lung malignancy. Further evaluation with a nuclear medicine PET/CT is recommended.  3. Findings consistent with multiple myeloma versus diffuse osseous metastasis, with chronic pathologic fractures at the levels of T6 and T9 vertebral bodies. 4. Markedly enlarged prostate gland. Others...      CT abd / kidney (same  study) >> Kidneys are normal in size, without renal or hydronephrosis. A 1.3 cm simple cyst is seen within the anteromedial aspect of the upper left kidney. Bladder is unremarkable.  Labs:   Na 114, creat 1.0, BUN 47   UA 8/19 and 9/11 > negative     9/12 > UNa 11, Uosm 568   Weights:     Admit 8/08 was 72kg, 73.7kg on 8/19, 63 kg on 8/26 after sent to CIR, 61 kg on 8/31, 58kg on 9/08    RA on 9/10 , 1L on 9/11 and 2L [place] today 9/12     Total prot since admit > lowest 5.3 and highest 6.8  Labs at 2 pm today > Na 122 CO2 16  BUN 48  Cr 1.18     Serum osm 9/07 - 284 w/ Na 122     Serum osm 9/12 - 270   Assessment/ Plan: 1. Hyponatremia - in pt w/ new dx mult myeloma, sp 1st round chemorx on 9/07.  Tprot not very high, serum osm is low suggesting true hyponatremia.  On exam no vol excess, possibly dry.  Meds reviewed, seroquel has low chance of SIADH, others not implicated. Kidneys appear normal by CT today.  Na improving so far w/ normal saline suggesting hypovolemia. Resp status borderline today, not sure cause. Agree w/ continuing IVF"s at 50 cc /hr.  Check TSH and am cortisol. Consider dc seroquel for now. Will follow.  2. Hx AKI due to myeloma - resolved for the most part down to creat 1.2 (4.7 on admssion).  3. Multiple myeloma - sp chemoRx 1st round her on 9/07 4. Fevers/ staph sepsis - +SOB, ?gallop on exam. Will get echo r/o valve insuff.  5. SOB - get echo, repeat CXR in am.       Kelly Splinter  MD 03/20/2020, 11:21 AM  Recent Labs  Lab 04/07/2020 1802 03/20/20 0040  WBC 4.6 4.4  HGB 10.0* 9.4*   Recent Labs  Lab 03/20/20 0634 03/20/20 0904  K 3.8 3.7  BUN 50* 49*  CREATININE 1.27* 1.24  CALCIUM 8.1* 7.9*

## 2020-03-21 ENCOUNTER — Inpatient Hospital Stay (HOSPITAL_COMMUNITY): Payer: Self-pay

## 2020-03-21 ENCOUNTER — Encounter (HOSPITAL_COMMUNITY): Payer: Self-pay | Admitting: Psychology

## 2020-03-21 ENCOUNTER — Inpatient Hospital Stay (HOSPITAL_COMMUNITY): Payer: Self-pay | Admitting: Physical Therapy

## 2020-03-21 DIAGNOSIS — J189 Pneumonia, unspecified organism: Secondary | ICD-10-CM | POA: Diagnosis present

## 2020-03-21 DIAGNOSIS — R7881 Bacteremia: Secondary | ICD-10-CM

## 2020-03-21 DIAGNOSIS — J15211 Pneumonia due to Methicillin susceptible Staphylococcus aureus: Secondary | ICD-10-CM

## 2020-03-21 DIAGNOSIS — A419 Sepsis, unspecified organism: Secondary | ICD-10-CM

## 2020-03-21 LAB — HEPARIN INDUCED PLATELET AB (HIT ANTIBODY): Heparin Induced Plt Ab: 0.049 OD (ref 0.000–0.400)

## 2020-03-21 LAB — GLUCOSE, CAPILLARY
Glucose-Capillary: 163 mg/dL — ABNORMAL HIGH (ref 70–99)
Glucose-Capillary: 141 mg/dL — ABNORMAL HIGH (ref 70–99)
Glucose-Capillary: 144 mg/dL — ABNORMAL HIGH (ref 70–99)
Glucose-Capillary: 156 mg/dL — ABNORMAL HIGH (ref 70–99)
Glucose-Capillary: 231 mg/dL — ABNORMAL HIGH (ref 70–99)
Glucose-Capillary: 197 mg/dL — ABNORMAL HIGH (ref 70–99)
Glucose-Capillary: 195 mg/dL — ABNORMAL HIGH (ref 70–99)

## 2020-03-21 LAB — BASIC METABOLIC PANEL
Anion gap: 11 (ref 5–15)
Anion gap: 11 (ref 5–15)
Anion gap: 11 (ref 5–15)
Anion gap: 11 (ref 5–15)
Anion gap: 9 (ref 5–15)
Anion gap: 9 (ref 5–15)
BUN: 40 mg/dL — ABNORMAL HIGH (ref 6–20)
BUN: 41 mg/dL — ABNORMAL HIGH (ref 6–20)
BUN: 44 mg/dL — ABNORMAL HIGH (ref 6–20)
BUN: 46 mg/dL — ABNORMAL HIGH (ref 6–20)
BUN: 47 mg/dL — ABNORMAL HIGH (ref 6–20)
BUN: 47 mg/dL — ABNORMAL HIGH (ref 6–20)
CO2: 15 mmol/L — ABNORMAL LOW (ref 22–32)
CO2: 15 mmol/L — ABNORMAL LOW (ref 22–32)
CO2: 15 mmol/L — ABNORMAL LOW (ref 22–32)
CO2: 16 mmol/L — ABNORMAL LOW (ref 22–32)
CO2: 18 mmol/L — ABNORMAL LOW (ref 22–32)
CO2: 18 mmol/L — ABNORMAL LOW (ref 22–32)
Calcium: 7.9 mg/dL — ABNORMAL LOW (ref 8.9–10.3)
Calcium: 7.9 mg/dL — ABNORMAL LOW (ref 8.9–10.3)
Calcium: 8 mg/dL — ABNORMAL LOW (ref 8.9–10.3)
Calcium: 8 mg/dL — ABNORMAL LOW (ref 8.9–10.3)
Calcium: 8.1 mg/dL — ABNORMAL LOW (ref 8.9–10.3)
Calcium: 8.1 mg/dL — ABNORMAL LOW (ref 8.9–10.3)
Chloride: 94 mmol/L — ABNORMAL LOW (ref 98–111)
Chloride: 96 mmol/L — ABNORMAL LOW (ref 98–111)
Chloride: 96 mmol/L — ABNORMAL LOW (ref 98–111)
Chloride: 96 mmol/L — ABNORMAL LOW (ref 98–111)
Chloride: 96 mmol/L — ABNORMAL LOW (ref 98–111)
Chloride: 97 mmol/L — ABNORMAL LOW (ref 98–111)
Creatinine, Ser: 1.05 mg/dL (ref 0.61–1.24)
Creatinine, Ser: 1.06 mg/dL (ref 0.61–1.24)
Creatinine, Ser: 1.13 mg/dL (ref 0.61–1.24)
Creatinine, Ser: 1.31 mg/dL — ABNORMAL HIGH (ref 0.61–1.24)
Creatinine, Ser: 1.34 mg/dL — ABNORMAL HIGH (ref 0.61–1.24)
Creatinine, Ser: 1.39 mg/dL — ABNORMAL HIGH (ref 0.61–1.24)
GFR calc Af Amer: >60 mL/min (ref >60)
GFR calc Af Amer: >60 mL/min (ref >60)
GFR calc Af Amer: >60 mL/min (ref >60)
GFR calc Af Amer: >60 mL/min (ref >60)
GFR calc Af Amer: >60 mL/min (ref >60)
GFR calc Af Amer: >60 mL/min (ref >60)
GFR calc non Af Amer: 56 mL/min — ABNORMAL LOW (ref >60)
GFR calc non Af Amer: 59 mL/min — ABNORMAL LOW (ref >60)
GFR calc non Af Amer: >60 mL/min (ref >60)
GFR calc non Af Amer: >60 mL/min (ref >60)
GFR calc non Af Amer: >60 mL/min (ref >60)
GFR calc non Af Amer: >60 mL/min (ref >60)
Glucose, Bld: 153 mg/dL — ABNORMAL HIGH (ref 70–99)
Glucose, Bld: 181 mg/dL — ABNORMAL HIGH (ref 70–99)
Glucose, Bld: 183 mg/dL — ABNORMAL HIGH (ref 70–99)
Glucose, Bld: 202 mg/dL — ABNORMAL HIGH (ref 70–99)
Glucose, Bld: 219 mg/dL — ABNORMAL HIGH (ref 70–99)
Glucose, Bld: 263 mg/dL — ABNORMAL HIGH (ref 70–99)
Potassium: 3.5 mmol/L (ref 3.5–5.1)
Potassium: 3.6 mmol/L (ref 3.5–5.1)
Potassium: 3.6 mmol/L (ref 3.5–5.1)
Potassium: 3.6 mmol/L (ref 3.5–5.1)
Potassium: 3.7 mmol/L (ref 3.5–5.1)
Potassium: 3.8 mmol/L (ref 3.5–5.1)
Sodium: 120 mmol/L — ABNORMAL LOW (ref 135–145)
Sodium: 122 mmol/L — ABNORMAL LOW (ref 135–145)
Sodium: 122 mmol/L — ABNORMAL LOW (ref 135–145)
Sodium: 123 mmol/L — ABNORMAL LOW (ref 135–145)
Sodium: 123 mmol/L — ABNORMAL LOW (ref 135–145)
Sodium: 124 mmol/L — ABNORMAL LOW (ref 135–145)

## 2020-03-21 LAB — CBC WITH DIFFERENTIAL/PLATELET
Abs Immature Granulocytes: 0.04 10*3/uL (ref 0.00–0.07)
Basophils Absolute: 0 10*3/uL (ref 0.0–0.1)
Basophils Relative: 0 %
Eosinophils Absolute: 0 10*3/uL (ref 0.0–0.5)
Eosinophils Relative: 0 %
HCT: 21.2 % — ABNORMAL LOW (ref 39.0–52.0)
Hemoglobin: 7.4 g/dL — ABNORMAL LOW (ref 13.0–17.0)
Immature Granulocytes: 2 %
Lymphocytes Relative: 3 %
Lymphs Abs: 0.1 10*3/uL — ABNORMAL LOW (ref 0.7–4.0)
MCH: 29 pg (ref 26.0–34.0)
MCHC: 34.9 g/dL (ref 30.0–36.0)
MCV: 83.1 fL (ref 80.0–100.0)
Monocytes Absolute: 0.1 10*3/uL (ref 0.1–1.0)
Monocytes Relative: 4 %
Neutro Abs: 2.2 10*3/uL (ref 1.7–7.7)
Neutrophils Relative %: 91 %
Platelets: 25 10*3/uL — CL (ref 150–400)
RBC: 2.55 MIL/uL — ABNORMAL LOW (ref 4.22–5.81)
RDW: 13.2 % (ref 11.5–15.5)
WBC: 2.5 10*3/uL — ABNORMAL LOW (ref 4.0–10.5)
nRBC: 0 % (ref 0.0–0.2)

## 2020-03-21 LAB — TSH: TSH: 0.448 u[IU]/mL (ref 0.350–4.500)

## 2020-03-21 LAB — LACTATE DEHYDROGENASE
LDH: 285 U/L — ABNORMAL HIGH (ref 98–192)
LDH: 299 U/L — ABNORMAL HIGH (ref 98–192)

## 2020-03-21 LAB — TYPE AND SCREEN
ABO/RH(D): O POS
Antibody Screen: NEGATIVE

## 2020-03-21 LAB — ECHOCARDIOGRAM COMPLETE: S' Lateral: 2.4 cm

## 2020-03-21 LAB — CORTISOL-AM, BLOOD: Cortisol - AM: 29.2 ug/dL — ABNORMAL HIGH (ref 6.7–22.6)

## 2020-03-21 LAB — SAVE SMEAR(SSMR), FOR PROVIDER SLIDE REVIEW

## 2020-03-21 LAB — PSA: Prostatic Specific Antigen: 1.59 ng/mL (ref 0.00–4.00)

## 2020-03-21 MED ORDER — ALBUTEROL SULFATE (2.5 MG/3ML) 0.083% IN NEBU
2.5000 mg | INHALATION_SOLUTION | Freq: Three times a day (TID) | RESPIRATORY_TRACT | Status: DC
  Administered 2020-03-21 – 2020-03-22 (×3): 2.5 mg via RESPIRATORY_TRACT
  Filled 2020-03-21 (×3): qty 3

## 2020-03-21 MED ORDER — DEXAMETHASONE SODIUM PHOSPHATE 4 MG/ML IJ SOLN
2.0000 mg | Freq: Two times a day (BID) | INTRAMUSCULAR | Status: AC
  Administered 2020-03-22 – 2020-03-24 (×6): 2 mg via INTRAVENOUS
  Filled 2020-03-21 (×5): qty 1
  Filled 2020-03-21: qty 0.5
  Filled 2020-03-21: qty 1

## 2020-03-21 MED ORDER — SODIUM CHLORIDE 0.9% IV SOLUTION: Freq: Once | INTRAVENOUS | Status: DC

## 2020-03-21 MED ORDER — SODIUM CHLORIDE 0.9 % IV SOLN: INTRAVENOUS | Status: AC

## 2020-03-21 MED ORDER — SODIUM CHLORIDE 0.9 % IV SOLN: INTRAVENOUS | Status: DC

## 2020-03-21 MED ORDER — OXYMETAZOLINE HCL 0.05 % NA SOLN
1.0000 | Freq: Two times a day (BID) | NASAL | Status: DC
  Administered 2020-03-21: 1 via NASAL
  Filled 2020-03-21: qty 30

## 2020-03-21 MED ORDER — OXYMETAZOLINE HCL 0.05 % NA SOLN
1.0000 | Freq: Four times a day (QID) | NASAL | Status: DC
  Administered 2020-03-21 – 2020-03-24 (×10): 1 via NASAL
  Filled 2020-03-21 (×2): qty 30

## 2020-03-21 NOTE — Discharge Summary (Signed)
Physician Discharge Summary  Patient ID: Giann Obara MRN: [id number] DOB/AGE: 14-Aug-1965 54 y.o.  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 There is no edema or airspace opacity. Heart is upper normal in size with pulmonary vascularity normal. No adenopathy. No bone lesions. IMPRESSION: Areas of patchy atelectasis bilaterally. No edema or airspace opacity. Cardiac silhouette within normal limits. Electronically Signed   By: Lowella Grip III M.D.   On: 03/26/2020 15:21   DG Abd 1 View  Result Date: 03/13/2020 CLINICAL DATA:  Nausea, vomiting. EXAM: ABDOMEN - 1 VIEW COMPARISON:  March 10, 2020. FINDINGS: The bowel gas pattern is normal. Moderate amount of stool seen in right colon. No radio-opaque calculi or other significant radiographic abnormality are seen. IMPRESSION: Moderate stool burden is noted. No evidence of bowel obstruction or ileus. Electronically Signed   By: Marijo Conception M.D.   On: 03/13/2020 11:31   DG Abd 1 View  Result Date: 03/10/2020 CLINICAL DATA:  Abdominal pain. EXAM: ABDOMEN - 1 VIEW COMPARISON:  Radiograph 03/05/2020. FINDINGS: Mild gaseous distention of large and small bowel in a nonobstructive pattern. Small to moderate stool in the right colon, with diminished stool burden from prior exam. There is no evidence of free intra-abdominal air. No radiopaque calculi.  No osseous abnormalities are seen. IMPRESSION: Bowel-gas pattern suggestive of ileus, with slight improvement in gaseous bowel distension over the past few days. Electronically Signed   By: Keith Rake M.D.   On: 03/10/2020 14:13   DG Abd 1 View  Result Date: 03/05/2020 CLINICAL DATA:  Abdominal pain and bloating. EXAM: ABDOMEN - 1 VIEW COMPARISON:  Abdominal radiograph 03/03/2020, CT 02/14/2020 FINDINGS: No evidence of bowel obstruction on the supine views. Mild gaseous distention of stomach with slight improvement from prior. Mild gaseous small bowel distension and a generalized pattern. Moderate colonic stool burden. No abnormal rectal distention. No radiopaque calculi. No acute osseous abnormalities are seen. IMPRESSION: Ileus bowel gas pattern with mild gaseous distension of small bowel loops and stomach with slight improvement from recent exam. No evidence of developing obstruction. Moderate colonic stool burden. Electronically Signed   By: Keith Rake M.D.   On: 03/05/2020 15:47    VAS [place] LOWER EXTREMITY VENOUS (DVT)  Result Date: 03/03/2020  Lower Venous DVTStudy Indications: Immobility.  Performing Technologist: Griffin Basil RCT RDMS  Examination Guidelines: A complete evaluation includes B-mode imaging, spectral Doppler, color Doppler, and power Doppler as needed of all accessible portions of each vessel. Bilateral testing is considered an integral part of a complete examination. Limited examinations for reoccurring indications may be performed as noted. The reflux portion of the exam is performed with the patient in reverse Trendelenburg.  +---------+---------------+---------+-----------+----------+--------------+ RIGHT    CompressibilityPhasicitySpontaneityPropertiesThrombus Aging +---------+---------------+---------+-----------+----------+--------------+ CFV      Full           Yes      Yes                                  +---------+---------------+---------+-----------+----------+--------------+  SFJ      Full                                                        +---------+---------------+---------+-----------+----------+--------------+ FV Prox  Full                                                        +---------+---------------+---------+-----------+----------+--------------+ FV Mid   Full                                                        +---------+---------------+---------+-----------+----------+--------------+ FV DistalFull                                                        +---------+---------------+---------+-----------+----------+--------------+ PFV      Full                                                        +---------+---------------+---------+-----------+----------+--------------+ POP      Full           Yes      Yes                                 +---------+---------------+---------+-----------+----------+--------------+ PTV      Full                                                        +---------+---------------+---------+-----------+----------+--------------+ PERO     Full                                                        +---------+---------------+---------+-----------+----------+--------------+   +---------+---------------+---------+-----------+----------+--------------+ LEFT     CompressibilityPhasicitySpontaneityPropertiesThrombus Aging +---------+---------------+---------+-----------+----------+--------------+ CFV      Full           Yes      Yes                                 +---------+---------------+---------+-----------+----------+--------------+ SFJ      Full                                                        +---------+---------------+---------+-----------+----------+--------------+  FV Prox  Full                                                         +---------+---------------+---------+-----------+----------+--------------+ FV Mid   Full                                                        +---------+---------------+---------+-----------+----------+--------------+ FV DistalFull                                                        +---------+---------------+---------+-----------+----------+--------------+ PFV      Full                                                        +---------+---------------+---------+-----------+----------+--------------+ POP      Full           Yes      Yes                                 +---------+---------------+---------+-----------+----------+--------------+ PTV      Full                                                        +---------+---------------+---------+-----------+----------+--------------+ PERO     Full                                                        +---------+---------------+---------+-----------+----------+--------------+     Summary: RIGHT: - There is no evidence of deep vein thrombosis in the lower extremity.  - No cystic structure found in the popliteal fossa.  LEFT: - There is no evidence of deep vein thrombosis in the lower extremity.  - No cystic structure found in the popliteal fossa.  *See table(s) above for measurements and observations. Electronically signed by Monica Martinez MD on 03/03/2020 at 3:26:05 PM.    Final     Labs:  Basic Metabolic Panel: Recent Labs  Lab 03/15/20 0810 03/17/20 1000 03/18/20 0847 03/12/2020 1802  NA 122* 127* 123* 114*  K 4.3 4.2 4.1 4.6  CL 94* 102 93* 86*  CO2 18* 15* 19* 19*  GLUCOSE 298* 228* 159* 227*  BUN 40* 30* 33* 47*  CREATININE 1.24 1.25* 0.91 1.06  CALCIUM 7.9* 8.1* 8.3* 8.3*    CBC: Recent Labs  Lab 03/12/2020 0517 04/03/2020 0517  WBC 2.9*   < >  NEUTROABS 2.1  --   HGB 11.1*   < >  HCT 31.5*   < >  MCV 84.7   < >  PLT 47*   < >   < > = values in this interval not displayed.    Brief  HPI:   Lakeem Rozo is a 54 y.o. male in relatively good health who was admitted to [facility] hospital on 02/14/20 with back pain after reports of fall few weeks prior, burning in his back as well as decrease in urine output.  He was found to have diffuse lytic lesions throughout the skeleton, epidural tumor extension with pathological fracture of T6 and T7, contrast-enhancing mass replacing most of T5 vertebral body with severe cord compression, acute fractures of left ninth and 10th ribs as well as acute renal failure with significant hyponatremia.  Nephrology consulted for input and patient was treated with aggressive hydration as well as calcitonin, Zometa and pamidronate for management of hypercalcemia.  Hyponatremia felt to be due to paraproteinemia.   Dr. Marcello Moores consulted for XRT and patient was placed on IV Decadron and has completed 10/10 doses of XRT.  Work-up consistent with newly diagnosed IgG kappa MM with plans for CyBorD chemo to start following completion of XRT.  His renal status is improved however sodium continued decline down to 123.  Lokelma was added due to onset of hypokalemia.  He was also found to have new diagnosis diabetes and blood sugars poorly controlled due to steroids on board.  He has had limitations due to poor p.o. intake, pain and neuropathy, Left>right chest wall pain as well as paraplegia.  CIR was recommended due to functional decline.   Hospital Course: Christphor Groft was admitted to rehab 03/03/2020 for inpatient therapies to consist of PT and OT at least three hours five days a week. Past admission physiatrist, therapy team and rehab RN have worked together to provide customized collaborative inpatient rehab. At admission, patient required max assist with ADL tasks and total assist with mobility. Weekly team conferences have been held to monitor progress set goals and discuss barriers to discharge. His blood pressures were monitored on TID basis and have been reasonably  controlled.  Dr. Carolin Sicks was consulted for input on recurrent hyponatremia and felt that patient with worsening of sodium levels due to dehydration.  He was treated with IV fluids with steady improvement in hyponatremia as well as dose of zoledronic acid on 08/27 with improvement in calcium levels.  Lokelma was discontinued as hypokalemia had resolved with improvement in renal function.  He has had intermittent issues with dehydration and poor p.o. intake and was briefly treated with IV fluids.  Carafate added due to reports of dysphagia.  He was treated with a course of Diflucan due to concerns of esophagitis.  His diabetes has been monitored with ac/hs CBG checks and SSI was use prn for tighter BS control.  He was maintained on Lantus during the stay and this was titrated upwards for tighter blood sugar control.  Steroids were slowly being weaned off with improvement in blood sugar.  Voiding was monitored with PVR checks and patient was voiding without difficulty.  KUB done showing evidence of ileus however is having bowel movements he was started on bowel program.  Regimen has been augmented to help manage moderate stool burden as evidenced from repeat KUB.  Pain control has been an issue therefore ultram was scheduled qid with oxycodone used on prn basis.   BLE Dopplers were negative for DVT.  He was maintained on subcu Lovenox due to high risk of  DVTs with close monitoring of platelets.  Lovenox was held briefly due to drop in platelets below 50,000.  Heme-onc fellow indwelling pancytopenia likely due to underlying malignancy and to continue monitoring periodically with transfusion of platelets if less than 10,000.  ENT was consulted for input and recommended use of saline drops to help manage nasal clots.  . Heme-onc was reconsulted for input on chemo and patient received dose of SQ Velcade and dexamethasone on 09/07 and was started on acyclovir. Hospital course has been significant for episode of  significant nosebleed 09/09 with drop in Hgb to 6.8 and he was transfused with 2 units PRBC. On 09/11, he was noted to be febrile with T-102 with mental status changes and pancultured. He was started on IV Zosyn/Vanc due to concerns of sepsis felt to be septic due to PNA. He was transferred to acute hospital for work up and closer monitoring.   Discharge medications: 1. Lantus insulin 30 units at bedtime.   2. Prosource 30 cc bid. 3. Acyclovir 400 mg po daily 4. Baclofen 10 mg po qid. 5. Lipitor 20 mg po daily 6. Norvasc 10 mg po daily 7. Coreg 3.125 mg po daily 8. Decadron 2 mg po bid 9. Fluconazole 200 mg po daily 10. Proamatine 7.5 mg po bid 11. Oxycodone 5-10 mg po every 4 hours prn pain 12. Afrin nose spray bid 13. Protonix 40 mg po bid 14. Miralax 17 gram po daily 15. Senna S 2 tabs po bid 16. Seroquel 100 mg po at HS 17. Tramadol 50 mg po qid 18. Carafate 1 gram po ac/hs 19. Lidocaine patch daily 20. Saline nasal spray every hour while awake.    Disposition: Acute hospital.      Signed: Bary Leriche 03/21/2020, 3:51 PM

## 2020-03-21 NOTE — Progress Notes (Signed)
PROGRESS NOTE    Trevor Shelton  DVV:[id number] DOB: Jun 23, 1966 DOA: 03/24/2020 PCP: Patient, No Pcp Per   Brief Narrative: 54 year old male with history of hypertension,hyperlipidemia was admitted 8/8-8/26 discharged to inpatient rehab. He was low because endoscope found to have multiple myeloma with spinal active lesions, left rib fracture on 03/18/2017,impending cord compression and underwent radiation therapy, decadron, impending cord compression s/p CyBorD chemotherapy recently, he was on HD short term.  He was discharged to inpatient rehab on 03/03/2020.  He underwent chemotherapy on 03/15/2020 and next chemotherapy is scheduled for 03/22/2020.  Patient had significant nosebleed on 03/17/2020.  He received 2 units PRBCs on 03/18/2020 due to hemoglobin 6.8.  Lovenox was stopped.  Patient spiked fevers with T-max 102 this afternoon. A CT chest/abdmen/pelvis was ordered but not done yet. His labs showed Na 114, CL 86, CO2 19, BUN 47, Cr 1.06, WBC 4.6 (was 2.9 on 9/10), hemoglobin 10, PLT 46 which is at his baseline, nonrevealing UA.  Covid is negative.CXR showed Areas of patchy atelectasis bilaterally. KUB showed stool burden. Patient was empirically started on vancomycin and Zosyn at inpatient rehab. he was transferred under [facility] 9/12 am.  CT chest/abd pelvis was done: 1. Mild to moderate severity bilateral areas of atelectasis and/or infiltrate. 2. 5.2 cm x 1.5 cm pleural based soft tissue mass along the anterior aspect of the left upper lobe. A 1.4 cm pleural based soft tissue nodule is seen within this region on the prior study. These findings are concerning for the presence of a primary lung malignancy. Further evaluation with a nuclear medicine PET/CT is recommended.  3. Findings consistent with multiple myeloma versus diffuse osseous metastasis, with chronic pathologic fractures at the levels of T6 and T9 vertebral bodies. 4. Markedly enlarged prostate gland. 5. 4.0 cm x 3.2 cm fat-containing right  scrotal hernia.  03/20/20: MSSA bacteremia- abx de-escalated to Ancef, Dr. Johnnye Sima ID consulted.  Also found to have a small intracranial hemorrhage seen by neurology, MRI brain and repeat CT.  6 hours later which showed decrease in size of conspicuity of unknown focus of intracranial hemorrhage.  Patient was managed on Percocet. Also seen by oncologist  Subjective:  On 3l Spur, picking on cannula/nose- epistaxis + Alert,awake, bedside spanish language interpretor. Oriented to place, self, mildly confused c/o hurting all over Type 7 stool charted- reflex c diff ordered   Assessment & Plan:  Small Intracranial hemorrhage:discussed w/ Neuro- and repeat CT head 9/12:showed decrease in size of intracranial hemorrhage.  keep the platelet count > 20,000 monitor closely appreciate neurology input  Severe Sepsis due to MSSA 2/2 blood cx +: severe sepsis with + blood culture, tachycardia and end organ damage with AKI/ altered mental status.  Seen by ID now on Ancef, rule out endocarditis with TTE May need TEE.Lactic acid was normal 1.4. resp viral panel/covid are negative, mrsa screen neg.  Acute hypoxic respiratory failure/suspected pneumonia with chest x-ray showing areas of atelectasis bilaterally overall increased today question superimposed pneumonia under left midlung, small left pleural effusion.  Continue supplemental oxygen, continue antibiotics as above.  Will order sputum culture urine and Legionella antigen.   Metastatic multiple myeloma:treated with radiation of spine due to concern for impending cord compression- MRI 8.8.21: Epidural tumor extension at T6 and T9 and T6 lesion severely narrowed thecal sac and deformed the spinal cord . He had velcade and dexamethasone, acyclovir, diflucan.  Continue plan as per nephrology.  Pancytopenia/Thrombcytopenia-secondary to patient's recent chemotherapy with Velcade and dexamethasone 03/15/2020. Keep plt above  20k  As per oncology neurology.  Continue to  monitor. Recent Labs  Lab 03/18/2020 1802 03/13/2020 1802 03/20/20 0040 03/20/20 1134 03/21/20 1359  HGB 10.0*  --  9.4*  --  7.4*  HCT 28.8*  --  26.1*  --  21.2*  WBC 4.6  --  4.4  --  2.5*  PLT 46*   < > 38* 28* 25*   < > = values in this interval not displayed.   Anemia from multiple myeloma/chemo: Also on blood loss with epistaxis: Patient had 2 units PRBC 9/10 for hemoglobin of 6.8  Hemoglobin drifting down monitor and transfuse as per oncology.  Added Afrin for epistaxis. With drop in hb, ongoing epistaxis-transfuse 1 unit platelets per oncology.Continue local care.  Monitor.  Epistaxis-Cont local care- platelet transfusion 1 units today, local care, afrin spray q6h.  Acute encephalopathy multifatorial- mri brain w/ small bleed, and also w/ sepsis, hyponatremia. appears still confused alert awake. Cont to monitor.  Chest pain/diffuse body pain likely from likely from myeloma. Cont pain control.Troponin negative.  Left upper lobe with 5.2/1.5 cm pleural based mass- concern for primary lung malignancy per CT. oncology on board may need further work-up likely IR biopsy once infection resolves and platelet counts are stable.  Hyponatremia: On IV fluids as per nephrology.  Sodium improved to 123. Plan as per nephrology. Recent Labs  Lab 03/20/20 1918 03/20/20 2343 03/20/20 2344 03/21/20 0240 03/21/20 1359  NA 121* 120* 122* 123* 123*   AKI/Short term HD in august 2021: renal function is stable, monitor Recent Labs  Lab 03/20/20 1918 03/20/20 2343 03/20/20 2344 03/21/20 0240 03/21/20 1359  BUN 48* 47* 47* 46* 40*  CREATININE 1.48* 1.34* 1.39* 2.42* 6.83   Metabolic acidosis- monitor  T2DM-newly diagnosed in august 2021. a1c 6.5.  Blood sugar stable.  Monitor Recent Labs  Lab 03/20/20 2100 03/21/20 0109 03/21/20 0538 03/21/20 0734 03/21/20 1236  GLUCAP 246* 163* 141* 144* 156*   Chronic constipation-stable  HTN/HLD- bp meds amlodipine on hold, on cored.  Monitor.  Markedly enlarged prostate in CT  Type 7  Stool charted-C. difficile ordered reflexly  DVT prophylaxis: SCDs Start: 03/20/20 0020 SCDs Start: 04/06/2020 2218 Code Status:   Code Status: Full Code  Family Communication: plan of care discussed with patient at bedside.Used Engineer, manufacturing systems. Monitor closely.At risk of decompensation. Discussed with oncology team who has discussed with his wife this afternoon  Status is: Inpatient Remains inpatient appropriate because:Ongoing diagnostic testing needed not appropriate for outpatient work up and Remains hospitalized due to ongoing management of MSSA sepsis, pancytopenia  Dispo: The patient is from: [facility]              Anticipated d/c is to: TBD              Anticipated d/c date is: > 3 days              Patient currently is not medically stable to d/c. Nutrition: Diet Order            Diet Carb Modified Fluid consistency: Thin; Room service appropriate? No  Diet effective now                There is no height or weight on file to calculate BMI.  Consultants:see note  Procedures: see note.  CT chest abd pelvis w/ contrast: Mild to moderate severity bilateral areas of atelectasis and/or infiltrate. 2. 5.2 cm x 1.5 cm pleural based soft tissue mass along the anterior  aspect of the left upper lobe. A 1.4 cm pleural based soft tissue nodule is seen within this region on the prior study. These findings are concerning for the presence of a primary lung malignancy. Further evaluation with a nuclear medicine PET/CT is recommended. 3. Findings consistent with multiple myeloma versus diffuse osseous metastasis, with chronic pathologic fractures at the levels of T6 and T9 vertebral bodies. 4. Markedly enlarged prostate gland. 5. 4.0 cm x 3.2 cm fat-containing right scrotal hernia. 6. Mild cardiomegaly. 7. Small hiatal hernia. 8. Aortic atherosclerosis. 9. Small simple cyst within the left kidney.  Microbiology:see note Blood  Culture    Component Value Date/Time   SDES BLOOD RIGHT ANTECUBITAL 03/21/2020 1802   SPECREQUEST  03/26/2020 1802    BOTTLES DRAWN AEROBIC AND ANAEROBIC Blood Culture adequate volume   CULT (A) 03/31/2020 1802    STAPHYLOCOCCUS AUREUS SUSCEPTIBILITIES TO FOLLOW Performed at [facility], [place] [address], [place], [place] [address]    REPTSTATUS PENDING 03/22/2020 1802    Other culture-see note  Medications: Scheduled Meds:  (feeding supplement) PROSource Plus  30 mL Oral BID BM   acyclovir  400 mg Oral Daily   baclofen  10 mg Oral QID   carvedilol  3.125 mg Oral BID WC   dexamethasone  2 mg Oral Q12H   fluconazole  100 mg Oral Daily   insulin aspart  0-9 Units Subcutaneous TID WC   insulin aspart  3 Units Subcutaneous TID WC   insulin glargine  15 Units Subcutaneous QHS   lidocaine  2 patch Transdermal Q24H   midodrine  7.5 mg Oral BID WC   oxymetazoline  1 spray Each Nare BID   pantoprazole  40 mg Oral BID AC   polyethylene glycol  17 g Oral Daily   Ensure Max Protein  11 oz Oral BID BM   QUEtiapine  100 mg Oral QHS   senna-docusate  2 tablet Oral BID   sodium chloride  2 spray Each Nare Q1H while awake   sucralfate  1 g Oral TID WC & HS   traMADol  50 mg Oral Q6H   Continuous Infusions:   ceFAZolin (ANCEF) IV 2 g (03/21/20 0542)    Antimicrobials: Anti-infectives (From admission, onward)   Start     Dose/Rate Route Frequency Ordered Stop   03/20/20 1600  ceFAZolin (ANCEF) IVPB 2g/100 mL premix        2 g 200 mL/hr over 30 Minutes Intravenous Every 8 hours 03/20/20 1505     03/20/20 1000  acyclovir (ZOVIRAX) 200 MG capsule 400 mg        400 mg Oral Daily 03/30/2020 2217     03/20/20 1000  fluconazole (DIFLUCAN) tablet 100 mg        100 mg Oral Daily 04/04/2020 2217 03/23/20 0959   03/20/20 0800  vancomycin (VANCOREADY) IVPB 750 mg/150 mL  Status:  Discontinued        750 mg 150 mL/hr over 60 Minutes Intravenous Every 12 hours 03/16/2020 2217  03/20/20 1452   03/20/20 0800  ceFEPIme (MAXIPIME) 2 g in sodium chloride 0.9 % 100 mL IVPB  Status:  Discontinued        2 g 200 mL/hr over 30 Minutes Intravenous Every 8 hours 03/20/20 0001 03/20/20 1452   03/20/20 0015  ceFEPIme (MAXIPIME) 2 g in sodium chloride 0.9 % 100 mL IVPB        2 g 200 mL/hr over 30 Minutes Intravenous  Once 03/20/20 0001  03/20/20 0159   03/20/2020 2230  vancomycin (VANCOREADY) IVPB 1250 mg/250 mL  Status:  Discontinued        1,250 mg 166.7 mL/hr over 90 Minutes Intravenous  Once 04/01/2020 2217 04/03/2020 2225   03/27/2020 2224  piperacillin-tazobactam (ZOSYN) IVPB 3.375 g  Status:  Discontinued        3.375 g 12.5 mL/hr over 240 Minutes Intravenous Every 8 hours 03/29/2020 2217 03/20/20 0000     Objective: Vitals: Today's Vitals   03/20/20 2046 03/21/20 0037 03/21/20 0339 03/21/20 0735  BP:   105/67 (!) 133/99  Pulse:   (!) 127 (!) 114  Resp:   (!) 22 20  Temp: 98.8 F (37.1 C) 98.3 F (36.8 C) 98.2 F (36.8 C) 98.9 F (37.2 C)  TempSrc: Oral Oral Oral Oral  SpO2:   99% 98%  PainSc:        Intake/Output Summary (Last 24 hours) at 03/21/2020 0905 Last data filed at 03/21/2020 0745 Gross per 24 hour  Intake 1813.33 ml  Output 1500 ml  Net 313.33 ml   There were no vitals filed for this visit. Weight change:   Intake/Output from previous day: 09/12 0701 - 09/13 0700 In: 1813.3 [I.V.:863.3; IV Piggyback:950] Out: 1300 [Urine:1300] Intake/Output this shift: Total I/O In: -  Out: 200 [Urine:200] Examination: General exam: AA oriented to place people, confused about date, on nasal cannula, weak appearing. HEENT:Oral mucosa moist, Ear/Nose WNL grossly,dentition normal. Respiratory system: bilaterally CLEAR,no wheezing or crackles,no use of accessory muscle, non tender. Cardiovascular system: S1 & S2 +, regular, No JVD. Gastrointestinal system: Abdomen soft, NT,ND, BS+. Nervous System:Alert, awake, moving extremities and grossly nonfocal Extremities:  No edema, distal peripheral pulses palpable.  Skin: No rashes,no icterus. MSK: Normal muscle bulk,tone, power  Data Reviewed: I have personally reviewed following labs and imaging studies CBC: Recent Labs  Lab 03/15/20 0810 03/15/20 0810 03/17/20 1000 03/17/20 1000 03/18/20 0930 03/09/2020 0517 03/17/2020 1802 03/20/20 0040 03/20/20 1134  WBC 4.2   4.2   < > 3.5*  --  2.7* 2.9* 4.6 4.4  --   NEUTROABS 3.4  --  2.6  --  2.1 2.1  --  3.5  --   HGB 8.0*   8.1*   < > 7.7*  --  6.8* 11.1* 10.0* 9.4*  --   HCT 23.1*   23.2*   < > 22.4*  --  19.7* 31.5* 28.8* 26.1*  --   MCV 83.7   84.4   < > 86.5  --  83.5 84.7 83.0 83.1  --   PLT PLATELET CLUMPS NOTED ON SMEAR, UNABLE TO ESTIMATE   76*   < > 75*   < > 49* 47* 46* 38* 28*   < > = values in this interval not displayed.   Basic Metabolic Panel: Recent Labs  Lab 03/20/20 0904 03/20/20 1402 03/20/20 1918 03/20/20 2344 03/21/20 0240  NA 121* 122* 121* 122* 123*  K 3.7 3.5 3.8 3.8 3.6  CL 93* 95* 96* 96* 96*  CO2 17* 16* 15* 15* 18*  GLUCOSE 132* 94 202* 181* 153*  BUN 49* 48* 48* 47* 46*  CREATININE 1.24 1.18 1.48* 1.39* 1.31*  CALCIUM 7.9* 7.9* 7.6* 7.9* 8.1*   GFR: Estimated Creatinine Clearance: 50.7 mL/min (A) (by C-G formula based on SCr of 1.31 mg/dL (H)). Liver Function Tests: Recent Labs  Lab 03/14/2020 1802  AST 38  ALT 30  ALKPHOS 94  BILITOT 0.6  PROT 6.3*  ALBUMIN 1.9*  No results for input(s): LIPASE, AMYLASE in the last 168 hours. No results for input(s): AMMONIA in the last 168 hours. Coagulation Profile: Recent Labs  Lab 03/20/20 0201 03/20/20 1134  INR 1.4* 1.5*   Cardiac Enzymes: No results for input(s): CKTOTAL, CKMB, CKMBINDEX, TROPONINI in the last 168 hours. BNP (last 3 results) No results for input(s): PROBNP in the last 8760 hours. HbA1C: No results for input(s): HGBA1C in the last 72 hours. CBG: Recent Labs  Lab 03/20/20 1641 03/20/20 2100 03/21/20 0109 03/21/20 0538 03/21/20 0734    GLUCAP 197* 246* 163* 141* 144*   Lipid Profile: No results for input(s): CHOL, HDL, LDLCALC, TRIG, CHOLHDL, LDLDIRECT in the last 72 hours. Thyroid Function Tests: Recent Labs    03/21/20 0240  TSH 0.448   Anemia Panel: Recent Labs    03/20/20 0634  RETICCTPCT 2.1   Sepsis Labs: Recent Labs  Lab 03/20/20 0040  LATICACIDVEN 1.4    Recent Results (from the past 240 hour(s))  Culture, Urine     Status: Abnormal   Collection Time: 03/24/2020  2:28 PM   Specimen: Urine, Random  Result Value Ref Range Status   Specimen Description URINE, RANDOM  Final   Special Requests NONE  Final   Culture (A)  Final    <10,000 COLONIES/mL INSIGNIFICANT GROWTH Performed at [facility], [place] [address] [place], [place] [address]    Report Status 03/20/2020 FINAL  Final  Culture, blood (routine x 2)     Status: Abnormal (Preliminary result)   Collection Time: 03/10/2020  6:02 PM   Specimen: BLOOD  Result Value Ref Range Status   Specimen Description BLOOD RIGHT ANTECUBITAL  Final   Special Requests   Final    BOTTLES DRAWN AEROBIC AND ANAEROBIC Blood Culture adequate volume   Culture  Setup Time   Final    GRAM POSITIVE COCCI IN CLUSTERS IN BOTH AEROBIC AND ANAEROBIC BOTTLES Organism ID to follow CRITICAL RESULT CALLED TO, READ BACK BY AND VERIFIED WITH: Barth Kirks PHARMD, AT [place] 03/20/20 BY D. VANHOOK    Culture (A)  Final    STAPHYLOCOCCUS AUREUS SUSCEPTIBILITIES TO FOLLOW Performed at [facility], [place] [address], [place], [place] [address]    Report Status PENDING  Incomplete  Blood Culture ID Panel (Reflexed)     Status: Abnormal   Collection Time: 03/28/2020  6:02 PM  Result Value Ref Range Status   Enterococcus faecalis NOT DETECTED NOT DETECTED Final   Enterococcus Faecium NOT DETECTED NOT DETECTED Final   Listeria monocytogenes NOT DETECTED NOT DETECTED Final   Staphylococcus species DETECTED (A) NOT DETECTED Final    Comment: CRITICAL RESULT CALLED TO, READ  BACK BY AND VERIFIED WITH: Barth Kirks PHARMD, AT 0740 03/20/20 BY D. VANHOOK    Staphylococcus aureus (BCID) DETECTED (A) NOT DETECTED Final    Comment: CRITICAL RESULT CALLED TO, READ BACK BY AND VERIFIED WITH: Barth Kirks PHARMD, AT 0740 03/20/20 BY D. VANHOOK    Staphylococcus epidermidis NOT DETECTED NOT DETECTED Final   Staphylococcus lugdunensis NOT DETECTED NOT DETECTED Final   Streptococcus species NOT DETECTED NOT DETECTED Final   Streptococcus agalactiae NOT DETECTED NOT DETECTED Final   Streptococcus pneumoniae NOT DETECTED NOT DETECTED Final   Streptococcus pyogenes NOT DETECTED NOT DETECTED Final   A.calcoaceticus-baumannii NOT DETECTED NOT DETECTED Final   Bacteroides fragilis NOT DETECTED NOT DETECTED Final   Enterobacterales NOT DETECTED NOT DETECTED Final   Enterobacter cloacae complex NOT DETECTED NOT DETECTED Final  Escherichia coli NOT DETECTED NOT DETECTED Final   Klebsiella aerogenes NOT DETECTED NOT DETECTED Final   Klebsiella oxytoca NOT DETECTED NOT DETECTED Final   Klebsiella pneumoniae NOT DETECTED NOT DETECTED Final   Proteus species NOT DETECTED NOT DETECTED Final   Salmonella species NOT DETECTED NOT DETECTED Final   Serratia marcescens NOT DETECTED NOT DETECTED Final   Haemophilus influenzae NOT DETECTED NOT DETECTED Final   Neisseria meningitidis NOT DETECTED NOT DETECTED Final   Pseudomonas aeruginosa NOT DETECTED NOT DETECTED Final   Stenotrophomonas maltophilia NOT DETECTED NOT DETECTED Final   Candida albicans NOT DETECTED NOT DETECTED Final   Candida auris NOT DETECTED NOT DETECTED Final   Candida glabrata NOT DETECTED NOT DETECTED Final   Candida krusei NOT DETECTED NOT DETECTED Final   Candida parapsilosis NOT DETECTED NOT DETECTED Final   Candida tropicalis NOT DETECTED NOT DETECTED Final   Cryptococcus neoformans/gattii NOT DETECTED NOT DETECTED Final   Meth resistant mecA/C and MREJ NOT DETECTED NOT DETECTED Final    Comment: Performed at  [facility], [place] [address] [place], [place] [address]  SARS Coronavirus 2 by RT PCR (hospital order, performed in [facility] hospital lab) Nasopharyngeal Nasopharyngeal Swab     Status: None   Collection Time: 03/17/2020  8:37 PM   Specimen: Nasopharyngeal Swab  Result Value Ref Range Status   SARS Coronavirus 2 NEGATIVE NEGATIVE Final    Comment: (NOTE) SARS-CoV-2 target nucleic acids are NOT DETECTED.  The SARS-CoV-2 RNA is generally detectable in upper and lower respiratory specimens during the acute phase of infection. The lowest concentration of SARS-CoV-2 viral copies this assay can detect is 250 copies / mL. A negative result does not preclude SARS-CoV-2 infection and should not be used as the sole basis for treatment or other patient management decisions.  A negative result may occur with improper specimen collection / handling, submission of specimen other than nasopharyngeal swab, presence of viral mutation(s) within the areas targeted by this assay, and inadequate number of viral copies (<250 copies / mL). A negative result must be combined with clinical observations, patient history, and epidemiological information.  Fact Sheet for Patients:   StrictlyIdeas.no  Fact Sheet for Healthcare Providers: BankingDealers.co.za  This test is not yet approved or  cleared by the [place] FDA and has been authorized for detection and/or diagnosis of SARS-CoV-2 by FDA under an Emergency Use Authorization (EUA).  This EUA will remain in effect (meaning this test can be used) for the duration of the COVID-19 declaration under Section 564(b)(1) of the Act, 21 U.S.C. section 360bbb-3(b)(1), unless the authorization is terminated or revoked sooner.  Performed at [facility], [place] [address], [place], [place] [address]   MRSA PCR Screening     Status: None   Collection Time: 03/20/20  4:21 AM   Specimen: Nasopharyngeal    Result Value Ref Range Status   MRSA by PCR NEGATIVE NEGATIVE Final    Comment:        The GeneXpert MRSA Assay (FDA approved for NASAL specimens only), is one component of a comprehensive MRSA colonization surveillance program. It is not intended to diagnose MRSA infection nor to guide or monitor treatment for MRSA infections. Performed at [facility], [place] [address] [place], [place] [address]   Respiratory Panel by PCR     Status: None   Collection Time: 03/20/20  4:21 AM   Specimen: Nasopharyngeal Swab; Respiratory  Result Value Ref Range Status  Adenovirus NOT DETECTED NOT DETECTED Final   Coronavirus 229E NOT DETECTED NOT DETECTED Final    Comment: (NOTE) The Coronavirus on the Respiratory Panel, DOES NOT test for the novel  Coronavirus (2019 nCoV)    Coronavirus HKU1 NOT DETECTED NOT DETECTED Final   Coronavirus NL63 NOT DETECTED NOT DETECTED Final   Coronavirus OC43 NOT DETECTED NOT DETECTED Final   Metapneumovirus NOT DETECTED NOT DETECTED Final   Rhinovirus / Enterovirus NOT DETECTED NOT DETECTED Final   Influenza A NOT DETECTED NOT DETECTED Final   Influenza B NOT DETECTED NOT DETECTED Final   Parainfluenza Virus 1 NOT DETECTED NOT DETECTED Final   Parainfluenza Virus 2 NOT DETECTED NOT DETECTED Final   Parainfluenza Virus 3 NOT DETECTED NOT DETECTED Final   Parainfluenza Virus 4 NOT DETECTED NOT DETECTED Final   Respiratory Syncytial Virus NOT DETECTED NOT DETECTED Final   Bordetella pertussis NOT DETECTED NOT DETECTED Final   Chlamydophila pneumoniae NOT DETECTED NOT DETECTED Final   Mycoplasma pneumoniae NOT DETECTED NOT DETECTED Final    Comment: Performed at [facility], [place] [address], [place], [place] [address]     Radiology Studies: DG Chest 2 View  Result Date: 03/21/2020 CLINICAL DATA:  Shortness of breath and fever EXAM: CHEST - 2 VIEW COMPARISON:  February 25, 2020 FINDINGS: There is mild atelectatic change in the left mid lung and  bibasilar regions. There is no edema or airspace opacity. Heart is upper normal in size with pulmonary vascularity normal. No adenopathy. No bone lesions. IMPRESSION: Areas of patchy atelectasis bilaterally. No edema or airspace opacity. Cardiac silhouette within normal limits. Electronically Signed   By: Lowella Grip III M.D.   On: 03/18/2020 15:21   CT HEAD WO CONTRAST  Result Date: 03/20/2020 CLINICAL DATA:  Stroke, follow-up. EXAM: CT HEAD WITHOUT CONTRAST TECHNIQUE: Contiguous axial images were obtained from the base of the skull through the vertex without intravenous contrast. COMPARISON:  Brain MRI 03/20/2020. CT head 03/20/2020 FINDINGS: Brain: The examination is mild to moderately motion degraded, limiting evaluation. A previously demonstrated foci of intracranial hemorrhage along the anteroinferior right frontal lobe convexity (suspected both intraparenchymal and extra-axial) has decreased in conspicuity (series 5, image 16, image 17). No new site of acute intracranial hemorrhage is identified. There is no significant mass effect.  No midline shift. Vascular: No hyperdense vessel Skull: Redemonstrated are innumerable marrow replacing lesions throughout the calvarium, skull base and visualized upper cervical spine compatible with the known history of multiple myeloma Sinuses/Orbits: No acute orbital abnormality identified. No significant paranasal sinus disease or mastoid effusion. IMPRESSION: Motion degraded examination. Interval decrease in size and conspicuity of a known focus of intracranial hemorrhage along the anteroinferior right frontal lobe convexity (suspected both intraparenchymal and extra-axial). No new site of acute intracranial hemorrhage is identified. Redemonstrated innumerable marrow replacing lesions throughout the calvarium, skull base and visualized upper cervical spine compatible with the known history of multiple myeloma. Electronically Signed   By: Kellie Simmering DO   On:  03/20/2020 13:39   CT HEAD WO CONTRAST  Result Date: 03/20/2020 CLINICAL DATA:  Initial evaluation for acute altered mental status. EXAM: CT HEAD WITHOUT CONTRAST TECHNIQUE: Contiguous axial images were obtained from the base of the skull through the vertex without intravenous contrast. COMPARISON:  None available. FINDINGS: Brain: Cerebral volume within normal limits for age. There is a somewhat focal lobulated hyperdensity immediately subjacent to the right frontal calvarium (series 3, image 18) finding also seen on coronal sequence (series  5, images 21-23) and sagittal sequence (series 6, image 23). Lesion measures up to 2.1 cm in greatest dimension on sagittal sequence. While this area is often prone to artifact, finding is suspicious for a small bleed. Finding is favored to be intra-axial in nature, although a small extra-axial component may be present as well. No significant mass effect or edema. No other acute intracranial hemorrhage. No acute large vessel territory infarct. No mass lesion, midline shift, or mass effect. No hydrocephalus. Vascular: No hyperdense vessel. Skull: Scalp soft tissues demonstrate no acute finding. Multiple scattered lucent lesion seen throughout the calvarium and visualized upper cervical spine. Note made of a prominent 1.7 cm lytic lesion within the clivus. Findings are nonspecific, but concerning for possible Mets or myeloma. Sinuses/Orbits: Globes and orbital soft tissues within normal limits. Paranasal sinuses are clear. Trace left mastoid effusion noted. Other: None. IMPRESSION: 1. Small lobulated hyperdensity immediately subjacent to the right frontal calvarium as above. Highly suspicious for a small bleed. No significant mass effect or edema. 2. Multiple scattered lucent lesions throughout the calvarium and visualized upper cervical spine, consistent with history of multiple myeloma. 3. No other acute intracranial abnormality. Critical Value/emergent results were called  by telephone at the time of interpretation on 03/20/2020 at 1:12 am to provider NA LI , who verbally acknowledged these results. Electronically Signed   By: Jeannine Boga M.D.   On: 03/20/2020 01:20   MR BRAIN W WO CONTRAST  Result Date: 03/20/2020 CLINICAL DATA:  Initial evaluation for brain mass or lesion. EXAM: MRI HEAD WITHOUT AND WITH CONTRAST TECHNIQUE: Multiplanar, multiecho pulse sequences of the brain and surrounding structures were obtained without and with intravenous contrast. CONTRAST:  69m GADAVIST GADOBUTROL 1 MMOL/ML IV SOLN COMPARISON:  Prior head CT from earlier the same day. FINDINGS: Brain: Cerebral volume within normal limits for age. No significant cerebral white matter disease or other focal parenchymal signal abnormality. Abnormal T1/FLAIR hyperintensity involving the anterior inferior right frontal convexity, consistent with acute intracranial hemorrhage, corresponding with abnormality on prior CT (series 11, images 10-14). This appears to be both intraparenchymal and extra-axial, and is favored to have initially represented a small parenchymal bleed that developed subdural/extra-axial extension. Size of this hemorrhage measures approximately 1.8 cm in greatest dimension on sagittal sequences. No associated edema or significant regional mass effect. No definite underlying lesion. Slight asymmetric smooth dural thickening and enhancement seen elsewhere about the right cerebral convexity, favored to be reactive. Consideration given to possible myelomatous involvement of the dura, although this would be expected to be more nodular and irregular in appearance. No other acute intracranial hemorrhage identified. Single punctate focus of susceptibility artifact noted at the right frontal centrum semi ovale, likely a small chronic microhemorrhage, of doubtful significance in isolation. No evidence for acute or subacute infarct. No encephalomalacia to suggest chronic cortical infarction. No  appreciable intra-axial mass lesion. Ventricles normal size without hydrocephalus. No other extra-axial fluid collection. 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 Midline structures intact. No other abnormal enhancement. Vascular: Major intracranial vascular flow voids are maintained. Skull and upper cervical spine: Craniocervical junction within normal limits. Extensive and innumerable marrow replacing lesions seen throughout the calvarium, skull base, and visualized upper spine, compatible with history of multiple myeloma. Most prominent of these lesions positioned in the clivus and measures approximately 2 cm in size. No other scalp soft tissue abnormality. Sinuses/Orbits: Globes and orbital soft tissues within normal limits. Paranasal sinuses are largely clear. No significant mastoid effusion. Inner ear structures grossly  normal. Other: None. IMPRESSION: 1. Acute intracranial hemorrhage involving the anterior-inferior right frontal convexity, stable from prior head CT. No associated edema or significant mass effect. 2. No other acute intracranial abnormality. 3. Innumerable marrow replacing lesions throughout the calvarium, skull base, and visualized upper spine, compatible with history of multiple myeloma. Electronically Signed   By: Jeannine Boga M.D.   On: 03/20/2020 07:35   CT CHEST ABDOMEN PELVIS W CONTRAST  Result Date: 03/20/2020 CLINICAL DATA:  Abdominal distension. EXAM: CT CHEST, ABDOMEN, AND PELVIS WITH CONTRAST TECHNIQUE: Multidetector CT imaging of the chest, abdomen and pelvis was performed following the standard protocol during bolus administration of intravenous contrast. CONTRAST:  127m OMNIPAQUE IOHEXOL 300 MG/ML  SOLN COMPARISON:  February 14, 2020 FINDINGS: CT CHEST FINDINGS Cardiovascular: No significant vascular findings. There is mild cardiomegaly. No pericardial effusion. Mediastinum/Nodes: No enlarged mediastinal, hilar, or axillary lymph nodes. Thyroid gland, trachea, and  esophagus demonstrate no significant findings. Lungs/Pleura: Mild to moderate severity areas of atelectasis and/or infiltrate are seen within the inferior aspect of the left upper lobe posterior aspect of the superior portion of the right lower lobe and posterior aspect of the bilateral lung bases. There is a very small left pleural effusion. A 5.2 cm x 1.5 cm pleural based soft tissue mass is seen along the anterior aspect of the left upper lobe (axial CT image 22, CT series number 3). A 1.4 cm pleural based soft tissue nodule is seen within this region on the prior study. Musculoskeletal: Chronic anterior second, third and fourth left rib fractures are noted. A chronic fracture of the right scapula is also seen. Numerous lytic lesions are seen throughout the thoracic spine with a chronic pathologic fractures noted at the levels of T6 and T9 vertebral bodies. These are seen on the prior study. CT ABDOMEN PELVIS FINDINGS Hepatobiliary: A 9 mm cystic appearing area is seen within the medial aspect of the right lobe of the liver. No gallstones, gallbladder wall thickening, or biliary dilatation. Pancreas: Unremarkable. No pancreatic ductal dilatation or surrounding inflammatory changes. Spleen: Normal in size without focal abnormality. Adrenals/Urinary Tract: Adrenal glands are unremarkable. Kidneys are normal in size, without renal or hydronephrosis. A 1.3 cm simple cyst is seen within the anteromedial aspect of the upper left kidney. Bladder is unremarkable. Stomach/Bowel: There is a small hiatal hernia. Appendix appears normal. No evidence of bowel wall thickening, distention, or inflammatory changes. A moderate amount of stool is seen within the distal sigmoid colon and rectum. Vascular/Lymphatic: No significant vascular findings are present. No enlarged abdominal or pelvic lymph nodes. Reproductive: The prostate gland is markedly enlarged. Other: A 4.0 cm x 3.2 cm fat-containing right scrotal hernia is noted. No  abdominopelvic ascites. Musculoskeletal: Numerous lytic lesions are seen throughout the lumbar spine and pelvis. IMPRESSION: 1. Mild to moderate severity bilateral areas of atelectasis and/or infiltrate. 2. 5.2 cm x 1.5 cm pleural based soft tissue mass along the anterior aspect of the left upper lobe. A 1.4 cm pleural based soft tissue nodule is seen within this region on the prior study. These findings are concerning for the presence of a primary lung malignancy. Further evaluation with a nuclear medicine PET/CT is recommended. 3. Findings consistent with multiple myeloma versus diffuse osseous metastasis, with chronic pathologic fractures at the levels of T6 and T9 vertebral bodies. 4. Markedly enlarged prostate gland. 5. 4.0 cm x 3.2 cm fat-containing right scrotal hernia. 6. Mild cardiomegaly. 7. Small hiatal hernia. 8. Aortic atherosclerosis. 9. Small simple cyst within  the left kidney. Aortic Atherosclerosis (ICD10-I70.0). Electronically Signed   By: Virgina Norfolk M.D.   On: 03/20/2020 01:09   DG CHEST PORT 1 VIEW  Result Date: 03/21/2020 CLINICAL DATA:  Shortness of breath EXAM: PORTABLE CHEST 1 VIEW COMPARISON:  March 19, 2020 chest radiograph and chest CT March 20, 2020 FINDINGS: There is persistent atelectatic change in left mid lung and left base regions. There is also atelectatic change in the right mid lung and right base regions, new. There may be superimposed pneumonia in the left mid lung. There is a small left pleural effusion. Heart is upper normal in size with pulmonary vascularity normal. No adenopathy. Old healed rib trauma on the left, better seen by CT. IMPRESSION: Areas of atelectatic change bilaterally, overall increased bilaterally. Question superimposed pneumonia left mid lung. Small left pleural effusion. Stable cardiac silhouette. Electronically Signed   By: Lowella Grip III M.D.   On: 03/21/2020 08:09   DG Abd Portable 1V  Result Date: 03/23/2020 CLINICAL DATA:   Fever EXAM: X-RAY ABDOMEN 1 VIEW COMPARISON:  03/13/2020 FINDINGS: The bowel gas pattern is nonspecific with gaseous distention of loops of small bowel and colon scattered throughout the abdomen. There is an above average amount of stool throughout the ascending colon. There is no definite pneumatosis or free air. No radiopaque kidney stones. IMPRESSION: 1. Nonspecific bowel gas pattern with gaseous distention of loops of small bowel and colon scattered throughout the abdomen. 2. Above average amount of stool throughout the ascending colon. This is not significantly changed from 03/13/2020. Electronically Signed   By: Constance Holster M.D.   On: 03/10/2020 17:46     LOS: 2 days   Antonieta Pert, MD [facility]  03/21/2020, 9:05 AM

## 2020-03-21 NOTE — Progress Notes (Signed)
Echocardiogram 2D Echocardiogram has been performed.  Trevor Shelton Emara Lichter 03/21/2020, 10:54 AM

## 2020-03-21 NOTE — Progress Notes (Signed)
Inpatient Rehabilitation Care Coordinator  Discharge Note  The overall goal for the admission was met for:   Discharge location: Yes. Pt d/c to acute hospital due to medical reasons.   Length of Stay: Yes. 16 days.   Discharge activity level: Yes. Mod A.   Home/community participation: Yes. Limited.   Services provided included: MD, RD, PT, OT, RN, CM, TR, Pharmacy, Neuropsych and SW  Financial Services: Other: Uninsured  Follow-up services arranged: N/A  Comments (or additional information):  Patient/Family verbalized understanding of follow-up arrangements: Yes  Individual responsible for coordination of the follow-up plan: N/A  Confirmed correct DME delivered: Rana Snare 03/21/2020    Rana Snare

## 2020-03-21 NOTE — Progress Notes (Signed)
Inpatient Rehabilitation Admissions Coordinator  Patient readmitted to acute from [facility] after 16 days of rehab. I will follow his progress to assist with planning most appropriate rehab venue options.   Danne Baxter, RN, MSN Rehab Admissions Coordinator [phone] 03/21/2020 3:47 PM

## 2020-03-21 NOTE — Progress Notes (Signed)
[place] [phone]   [phone]  PROGRESS NOTE  Patient Care Team: Patient, No Pcp Per as PCP - General (General Practice)  Hematological/Oncological History # Lytic Lesions Throughout Skeleton/Hypercalcemia # Pathologic Fractures of Spine/Soft Tissue Masses in Spine #Concern for Multiple Myeloma 1) 02/14/2020: presented to the emergency department with markedly worsening back pain. 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 The T6 lesion severely narrows the thecal sac and deforms the spinal cord. 3) 02/18/2020: establish care with Dr. Lorenso Courier    HISTORY OF PRESENTING ILLNESS:  Trevor Shelton 54 y.o. male with medical history significant for HTN and HLD who presented with back pain and was found to have cord compression and lytic lesions concerning for multiple myeloma.   On review of the previous records progress he initially presented to the emergency department on 02/14/2020 with markedly worsening back pain. The pain has been going on for approximately 1 month's time when he initially presented. In the emergency department had a CT chest abdomen pelvis which showed lytic lesions throughout the skeleton consistent with multiple myeloma or metastatic disease. There were also signs of compression fracture of the spine. An MRI spine was ordered which showed epidural tumor extension at T6 and T8-9. The T6 lesion severely narrowed the thecal sac and deforms the spinal cord. The patient was referred to radiation oncology. In addition to radiation oncology, medical oncology was consulted for further evaluation and management as well.  Events from this past weekend noted.  The patient was transferred from the inpatient rehab unit back to the hospital.  Imaging of the head showed intracranial hemorrhage.  He is being seen by  neurology.  Infectious disease has also been consulted due MSSA positive blood cultures.  A CT of the chest, abdomen, pelvis was obtained which showed a 5.2 x 1.5 cm pleural-based soft tissue mass in the left upper lobe concerning for a primary lung malignancy, bone lesions consistent with myeloma versus diffuse osseous metastatic disease, markedly enlarged prostate gland.  Has also developed worsening anemia and thrombocytopenia.  Work-up for hemolysis negative.  No evidence of DIC.  HIT panel sent due to concern for heparin-induced thrombocytopenia and is pending although timing not really consistent with HIT.  TTE performed earlier today without evidence of valvular vegetations.  The patient was seen today with the assistance of an interpreter.  His wife is at the bedside.  The patient's wife reports that he is "out of it."  He is somewhat confused today.  No fevers documented.  No headaches reported.  He reports pain to the right side of his chest and shortness of breath.  He has also been having intermittent nosebleeds today.  Nosebleeds stop with pressure.  He also has sores on his lips with dried blood.  Chest x-ray from today shows possible pneumonia in the left midlung.  The patient has been having diarrhea today.  Stool for C. difficile has been ordered and collected but not yet resulted.  The remainder of a 10 point review of systems is noncontributory.  MEDICAL HISTORY:  Past Medical History:  Diagnosis Date  . AKI (acute kidney injury) ([place]) 02/2020  . Hypercalcemia 02/2020  . Hypercholesteremia   . Hypertension     SURGICAL HISTORY: Past Surgical History:  Procedure Laterality Date  . HERNIA REPAIR    . IR FLUORO GUIDE CV LINE RIGHT  02/17/2020  .  IR REMOVAL TUN CV CATH W/O FL  03/01/2020  . IR US GUIDE VASC ACCESS RIGHT  02/17/2020    SOCIAL HISTORY: Social History   Socioeconomic History  . Marital status: Married    Spouse name: Not on file  . Number of children: Not on file   . Years of education: Not on file  . Highest education level: Not on file  Occupational History  . Not on file  Tobacco Use  . Smoking status: Never Smoker  . Smokeless tobacco: Never Used  Vaping Use  . Vaping Use: Never used  Substance and Sexual Activity  . Alcohol use: Not Currently  . Drug use: Not Currently  . Sexual activity: Not Currently  Other Topics Concern  . Not on file  Social History Narrative  . Not on file   Social Determinants of Health   Financial Resource Strain:   . Difficulty of Paying Living Expenses: Not on file  Food Insecurity:   . Worried About Charity fundraiser in the Last Year: Not on file  . Ran Out of Food in the Last Year: Not on file  Transportation Needs:   . Lack of Transportation (Medical): Not on file  . Lack of Transportation (Non-Medical): Not on file  Physical Activity:   . Days of Exercise per Week: Not on file  . Minutes of Exercise per Session: Not on file  Stress:   . Feeling of Stress : Not on file  Social Connections:   . Frequency of Communication with Friends and Family: Not on file  . Frequency of Social Gatherings with Friends and Family: Not on file  . Attends Religious Services: Not on file  . Active Member of Clubs or Organizations: Not on file  . Attends Archivist Meetings: Not on file  . Marital Status: Not on file  Intimate Partner Violence:   . Fear of Current or Ex-Partner: Not on file  . Emotionally Abused: Not on file  . Physically Abused: Not on file  . Sexually Abused: Not on file    FAMILY HISTORY: Family History  Problem Relation Age of Onset  . Alzheimer's disease Mother   . Diabetes Father   . Diabetes Brother     ALLERGIES:  is allergic to other and penicillins.  MEDICATIONS:  Current Facility-Administered Medications  Medication Dose Route Frequency Provider Last Rate Last Admin  . (feeding supplement) PROSource Plus liquid 30 mL  30 mL Oral BID BM Li, Na, MD   30 mL at  03/21/20 0856  . 0.9 %  sodium chloride infusion   Intravenous Continuous Claudia Desanctis, MD 50 mL/hr at 03/21/20 1258 New Bag at 03/21/20 1258  . acetaminophen (TYLENOL) suppository 650 mg  650 mg Rectal Q6H PRN Nicoletta Dress, Na, MD      . acetaminophen (TYLENOL) tablet 650 mg  650 mg Oral Q6H PRN Nicoletta Dress, Na, MD   650 mg at 03/20/20 1057  . acyclovir (ZOVIRAX) 200 MG capsule 400 mg  400 mg Oral Daily Nicoletta Dress, Na, MD   400 mg at 03/21/20 0856  . albuterol (PROVENTIL) (2.5 MG/3ML) 0.083% nebulizer solution 2.5 mg  2.5 mg Nebulization TID AC & HS Kc, Ramesh, MD   2.5 mg at 03/21/20 1350  . alum & mag hydroxide-simeth (MAALOX/MYLANTA) 200-200-20 MG/5ML suspension 30 mL  30 mL Oral Q4H PRN Nicoletta Dress, Na, MD      . baclofen (LIORESAL) tablet 10 mg  10 mg Oral QID Charlann Lange, MD  10 mg at 03/21/20 0855  . bisacodyl (DULCOLAX) suppository 10 mg  10 mg Rectal Daily PRN Nicoletta Dress, Na, MD      . carvedilol (COREG) tablet 3.125 mg  3.125 mg Oral BID WC Li, Na, MD   3.125 mg at 03/21/20 0855  . ceFAZolin (ANCEF) IVPB 2g/100 mL premix  2 g Intravenous Q8H Einar Grad, RPH 200 mL/hr at 03/21/20 0542 2 g at 03/21/20 0542  . chlorproMAZINE (THORAZINE) tablet 25 mg  25 mg Oral QID PRN Nicoletta Dress, Na, MD      . clonazepam (KLONOPIN) disintegrating tablet 0.25 mg  0.25 mg Oral BID PRN Nicoletta Dress, Na, MD   0.25 mg at 03/21/20 0109  . dexamethasone (DECADRON) tablet 2 mg  2 mg Oral Q12H Li, Na, MD   2 mg at 03/21/20 0857  . diphenhydrAMINE (BENADRYL) 12.5 MG/5ML elixir 12.5-25 mg  12.5-25 mg Oral Q6H PRN Nicoletta Dress, Na, MD      . fluconazole (DIFLUCAN) tablet 100 mg  100 mg Oral Daily Nicoletta Dress, Na, MD   100 mg at 03/21/20 0856  . guaiFENesin-dextromethorphan (ROBITUSSIN DM) 100-10 MG/5ML syrup 5-10 mL  5-10 mL Oral Q6H PRN Li, Na, MD      . insulin aspart (novoLOG) injection 0-9 Units  0-9 Units Subcutaneous TID WC Nicoletta Dress, Na, MD   2 Units at 03/21/20 1254  . insulin aspart (novoLOG) injection 3 Units  3 Units Subcutaneous TID WC Nicoletta Dress, Na, MD   3 Units at 03/21/20 1254  . insulin  glargine (LANTUS) injection 15 Units  15 Units Subcutaneous QHS Antonieta Pert, MD   15 Units at 03/20/20 2247  . lidocaine (LIDODERM) 5 % 2 patch  2 patch Transdermal Q24H Nicoletta Dress, Na, MD   2 patch at 03/21/20 1152  . lidocaine (XYLOCAINE) 2 % jelly   Topical PRN Li, Na, MD      . lidocaine (XYLOCAINE) 2 % viscous mouth solution 15 mL  15 mL Mouth/Throat Q6H PRN Nicoletta Dress, Na, MD      . magic mouthwash w/lidocaine  5 mL Oral QID PRN Nicoletta Dress, Na, MD      . midodrine (PROAMATINE) tablet 7.5 mg  7.5 mg Oral BID WC Li, Na, MD   7.5 mg at 03/21/20 1153  . ondansetron (ZOFRAN) tablet 4 mg  4 mg Oral Q6H PRN Nicoletta Dress, Na, MD       Or  . ondansetron (ZOFRAN) injection 4 mg  4 mg Intravenous Q6H PRN Nicoletta Dress, Na, MD      . ondansetron (ZOFRAN) tablet 4 mg  4 mg Oral Q6H PRN Nicoletta Dress, Na, MD      . oxyCODONE (Oxy IR/ROXICODONE) immediate release tablet 5-10 mg  5-10 mg Oral Q4H PRN Nicoletta Dress, Na, MD   10 mg at 03/28/2020 2321  . oxymetazoline (AFRIN) 0.05 % nasal spray 1 spray  1 spray Each Nare BID Antonieta Pert, MD   1 spray at 03/21/20 1257  . pantoprazole (PROTONIX) EC tablet 40 mg  40 mg Oral BID AC Li, Na, MD   40 mg at 03/21/20 0855  . polyethylene glycol (MIRALAX / GLYCOLAX) packet 17 g  17 g Oral Daily PRN Li, Na, MD      . polyethylene glycol (MIRALAX / GLYCOLAX) packet 17 g  17 g Oral Daily Li, Na, MD      . prochlorperazine (COMPAZINE) tablet 5-10 mg  5-10 mg Oral Q6H PRN Nicoletta Dress, Na, MD       Or  . prochlorperazine (COMPAZINE) injection 5-10 mg  5-10 mg Intramuscular Q6H PRN Dierdre Searles, Na, MD       Or  . prochlorperazine (COMPAZINE) suppository 12.5 mg  12.5 mg Rectal Q6H PRN Dierdre Searles, Na, MD      . protein supplement (ENSURE MAX) liquid  11 oz Oral BID BM Li, Na, MD   11 oz at 03/21/20 1204  . QUEtiapine (SEROQUEL) tablet 100 mg  100 mg Oral QHS Li, Na, MD   100 mg at 03/20/20 2246  . senna-docusate (Senokot-S) tablet 2 tablet  2 tablet Oral BID Dierdre Searles, Na, MD      . simethicone (MYLICON) chewable tablet 80 mg  80 mg Oral QID PRN Dierdre Searles, Na, MD      . sodium  bicarbonate/sodium chloride mouthwash   Mouth Rinse PRN Dierdre Searles, Na, MD      . sodium chloride (OCEAN) 0.65 % nasal spray 2 spray  2 spray Each Nare Q1H while awake Dierdre Searles, Na, MD   2 spray at 03/21/20 1255  . sorbitol 70 % solution 30 mL  30 mL Oral Daily PRN Dierdre Searles, Na, MD      . sucralfate (CARAFATE) 1 GM/10ML suspension 1 g  1 g Oral TID WC & HS Li, Na, MD   1 g at 03/21/20 1206  . traMADol (ULTRAM) tablet 50 mg  50 mg Oral Q6H Li, Na, MD   50 mg at 03/21/20 1153  . white petrolatum (VASELINE) gel   Topical PRN Dierdre Searles, Na, MD        REVIEW OF SYSTEMS:   Constitutional: ( - ) fevers, ( - )  chills , ( - ) night sweats Eyes: ( - ) blurriness of vision, ( - ) double vision, ( - ) watery eyes Ears, nose, mouth, throat, and face: (+) Epistaxis ( + ) mucositis, ( - ) sore throat Respiratory: ( - ) cough, ( - ) dyspnea, ( - ) wheezes Cardiovascular: ( - ) palpitation, ( - ) chest discomfort, ( - ) lower extremity swelling Gastrointestinal:  ( - ) nausea, ( - ) heartburn, ( - ) change in bowel habits Skin: ( - ) abnormal skin rashes Lymphatics: ( - ) new lymphadenopathy, ( + ) easy bruising Neurological: ( - ) numbness, ( - ) tingling, ( - ) new weaknesses Behavioral/Psych: ( - ) mood change, ( - ) new changes  All other systems were reviewed with the patient and are negative.  PHYSICAL EXAMINATION:  GENERAL: Sitting up in the recliner, appears weak, has difficulty staying awake at times SKIN: skin color, texture, turgor are normal, no rashes or significant lesions OROPHARYNX: Dried blisters on lips, appear herpetic and looked improved compared to prior exam  EYES: conjunctiva are pink and non-injected, sclera non-injected LUNGS: Wheezing present HEART: Tachycardic ABDOMEN: soft, non-tender, non-distended, normal bowel sounds Musculoskeletal: no cyanosis of digits and no clubbing.  5/5 strength in the right lower extremity and 3/5 strength in the left lower extremity. PSYCH: alert & oriented x 3,  fluent speech NEURO: no focal motor/sensory deficits  LABORATORY DATA:  I have reviewed the data as listed CBC Latest Ref Rng & Units 03/21/2020 03/20/2020 03/20/2020  WBC 4.0 - 10.5 K/uL 2.5(L) - 4.4  Hemoglobin 13.0 - 17.0 g/dL 7.4(L) - 9.4(L)  Hematocrit 39 - 52 % 21.2(L) - 26.1(L)  Platelets 150 - 400 K/uL 25(LL) 28(LL) 38(L)    CMP Latest Ref Rng & Units 03/21/2020 03/21/2020 03/20/2020  Glucose 70 - 99 mg/dL 223(C) 097(V) 499(Z)  BUN 6 - 20  mg/dL 40(H) 46(H) 47(H)  Creatinine 0.61 - 1.24 mg/dL 1.05 1.31(H) 1.39(H)  Sodium 135 - 145 mmol/L 123(L) 123(L) 122(L)  Potassium 3.5 - 5.1 mmol/L 3.6 3.6 3.8  Chloride 98 - 111 mmol/L 96(L) 96(L) 96(L)  CO2 22 - 32 mmol/L 16(L) 18(L) 15(L)  Calcium 8.9 - 10.3 mg/dL 8.0(L) 8.1(L) 7.9(L)  Total Protein 6.5 - 8.1 g/dL - - -  Total Bilirubin 0.3 - 1.2 mg/dL - - -  Alkaline Phos 38 - 126 U/L - - -  AST 15 - 41 U/L - - -  ALT 0 - 44 U/L - - -     RADIOGRAPHIC STUDIES: I have personally reviewed the radiological images as listed and agreed with the findings in the report pronounced lytic lesions and cord compression in thoracic spine.   DG Chest 2 View  Result Date: 04/05/2020 CLINICAL DATA:  Shortness of breath and fever EXAM: CHEST - 2 VIEW COMPARISON:  February 25, 2020 FINDINGS: There is mild atelectatic change in the left mid lung and bibasilar regions. There is no edema or airspace opacity. Heart is upper normal in size with pulmonary vascularity normal. No adenopathy. No bone lesions. IMPRESSION: Areas of patchy atelectasis bilaterally. No edema or airspace opacity. Cardiac silhouette within normal limits. Electronically Signed   By: Lowella Grip III M.D.   On: 03/15/2020 15:21   DG Abd 1 View  Result Date: 03/13/2020 CLINICAL DATA:  Nausea, vomiting. EXAM: ABDOMEN - 1 VIEW COMPARISON:  March 10, 2020. FINDINGS: The bowel gas pattern is normal. Moderate amount of stool seen in right colon. No radio-opaque calculi or other significant  radiographic abnormality are seen. IMPRESSION: Moderate stool burden is noted. No evidence of bowel obstruction or ileus. Electronically Signed   By: Marijo Conception M.D.   On: 03/13/2020 11:31   DG Abd 1 View  Result Date: 03/10/2020 CLINICAL DATA:  Abdominal pain. EXAM: ABDOMEN - 1 VIEW COMPARISON:  Radiograph 03/05/2020. FINDINGS: Mild gaseous distention of large and small bowel in a nonobstructive pattern. Small to moderate stool in the right colon, with diminished stool burden from prior exam. There is no evidence of free intra-abdominal air. No radiopaque calculi. No osseous abnormalities are seen. IMPRESSION: Bowel-gas pattern suggestive of ileus, with slight improvement in gaseous bowel distension over the past few days. Electronically Signed   By: Keith Rake M.D.   On: 03/10/2020 14:13   DG Abd 1 View  Result Date: 03/05/2020 CLINICAL DATA:  Abdominal pain and bloating. EXAM: ABDOMEN - 1 VIEW COMPARISON:  Abdominal radiograph 03/03/2020, CT 02/14/2020 FINDINGS: No evidence of bowel obstruction on the supine views. Mild gaseous distention of stomach with slight improvement from prior. Mild gaseous small bowel distension and a generalized pattern. Moderate colonic stool burden. No abnormal rectal distention. No radiopaque calculi. No acute osseous abnormalities are seen. IMPRESSION: Ileus bowel gas pattern with mild gaseous distension of small bowel loops and stomach with slight improvement from recent exam. No evidence of developing obstruction. Moderate colonic stool burden. Electronically Signed   By: Keith Rake M.D.   On: 03/05/2020 15:47   DG Abd 1 View  Result Date: 03/03/2020 CLINICAL DATA:  Cord compression.  Left upper quadrant pain. EXAM: ABDOMEN - 1 VIEW COMPARISON:  CT 02/14/2020. FINDINGS: Distended loops of small and large bowel noted suggesting adynamic ileus. Recent CT of the abdomen pelvis of 02/14/2020 did reveal a right inguinal hernia with herniation of fat. No free  air. Bony lesions present best identified by  prior CT. IMPRESSION: Distended loops of small and large bowel noted suggesting adynamic ileus. Recent CT of the abdomen pelvis of 02/14/2020 however did reveal a right inguinal hernia with herniation of fat. Follow-up exams suggested to demonstrate resolution of bowel distention in order to exclude bowel obstruction. Electronically Signed   By: Marcello Moores  Register   On: 03/03/2020 13:56   CT HEAD WO CONTRAST  Result Date: 03/20/2020 CLINICAL DATA:  Stroke, follow-up. EXAM: CT HEAD WITHOUT CONTRAST TECHNIQUE: Contiguous axial images were obtained from the base of the skull through the vertex without intravenous contrast. COMPARISON:  Brain MRI 03/20/2020. CT head 03/20/2020 FINDINGS: Brain: The examination is mild to moderately motion degraded, limiting evaluation. A previously demonstrated foci of intracranial hemorrhage along the anteroinferior right frontal lobe convexity (suspected both intraparenchymal and extra-axial) has decreased in conspicuity (series 5, image 16, image 17). No new site of acute intracranial hemorrhage is identified. There is no significant mass effect.  No midline shift. Vascular: No hyperdense vessel Skull: Redemonstrated are innumerable marrow replacing lesions throughout the calvarium, skull base and visualized upper cervical spine compatible with the known history of multiple myeloma Sinuses/Orbits: No acute orbital abnormality identified. No significant paranasal sinus disease or mastoid effusion. IMPRESSION: Motion degraded examination. Interval decrease in size and conspicuity of a known focus of intracranial hemorrhage along the anteroinferior right frontal lobe convexity (suspected both intraparenchymal and extra-axial). No new site of acute intracranial hemorrhage is identified. Redemonstrated innumerable marrow replacing lesions throughout the calvarium, skull base and visualized upper cervical spine compatible with the known history  of multiple myeloma. Electronically Signed   By: Kellie Simmering DO   On: 03/20/2020 13:39   CT HEAD WO CONTRAST  Result Date: 03/20/2020 CLINICAL DATA:  Initial evaluation for acute altered mental status. EXAM: CT HEAD WITHOUT CONTRAST TECHNIQUE: Contiguous axial images were obtained from the base of the skull through the vertex without intravenous contrast. COMPARISON:  None available. FINDINGS: Brain: Cerebral volume within normal limits for age. There is a somewhat focal lobulated hyperdensity immediately subjacent to the right frontal calvarium (series 3, image 18) finding also seen on coronal sequence (series 5, images 21-23) and sagittal sequence (series 6, image 23). Lesion measures up to 2.1 cm in greatest dimension on sagittal sequence. While this area is often prone to artifact, finding is suspicious for a small bleed. Finding is favored to be intra-axial in nature, although a small extra-axial component may be present as well. No significant mass effect or edema. No other acute intracranial hemorrhage. No acute large vessel territory infarct. No mass lesion, midline shift, or mass effect. No hydrocephalus. Vascular: No hyperdense vessel. Skull: Scalp soft tissues demonstrate no acute finding. Multiple scattered lucent lesion seen throughout the calvarium and visualized upper cervical spine. Note made of a prominent 1.7 cm lytic lesion within the clivus. Findings are nonspecific, but concerning for possible Mets or myeloma. Sinuses/Orbits: Globes and orbital soft tissues within normal limits. Paranasal sinuses are clear. Trace left mastoid effusion noted. Other: None. IMPRESSION: 1. Small lobulated hyperdensity immediately subjacent to the right frontal calvarium as above. Highly suspicious for a small bleed. No significant mass effect or edema. 2. Multiple scattered lucent lesions throughout the calvarium and visualized upper cervical spine, consistent with history of multiple myeloma. 3. No other acute  intracranial abnormality. Critical Value/emergent results were called by telephone at the time of interpretation on 03/20/2020 at 1:12 am to provider NA LI , who verbally acknowledged these results. Electronically Signed   By:  Jeannine Boga M.D.   On: 03/20/2020 01:20   MR BRAIN W WO CONTRAST  Result Date: 03/20/2020 CLINICAL DATA:  Initial evaluation for brain mass or lesion. EXAM: MRI HEAD WITHOUT AND WITH CONTRAST TECHNIQUE: Multiplanar, multiecho pulse sequences of the brain and surrounding structures were obtained without and with intravenous contrast. CONTRAST:  71m GADAVIST GADOBUTROL 1 MMOL/ML IV SOLN COMPARISON:  Prior head CT from earlier the same day. FINDINGS: Brain: Cerebral volume within normal limits for age. No significant cerebral white matter disease or other focal parenchymal signal abnormality. Abnormal T1/FLAIR hyperintensity involving the anterior inferior right frontal convexity, consistent with acute intracranial hemorrhage, corresponding with abnormality on prior CT (series 11, images 10-14). This appears to be both intraparenchymal and extra-axial, and is favored to have initially represented a small parenchymal bleed that developed subdural/extra-axial extension. Size of this hemorrhage measures approximately 1.8 cm in greatest dimension on sagittal sequences. No associated edema or significant regional mass effect. No definite underlying lesion. Slight asymmetric smooth dural thickening and enhancement seen elsewhere about the right cerebral convexity, favored to be reactive. Consideration given to possible myelomatous involvement of the dura, although this would be expected to be more nodular and irregular in appearance. No other acute intracranial hemorrhage identified. Single punctate focus of susceptibility artifact noted at the right frontal centrum semi ovale, likely a small chronic microhemorrhage, of doubtful significance in isolation. No evidence for acute or subacute  infarct. No encephalomalacia to suggest chronic cortical infarction. No appreciable intra-axial mass lesion. Ventricles normal size without hydrocephalus. No other extra-axial fluid collection. 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 Midline structures intact. No other abnormal enhancement. Vascular: Major intracranial vascular flow voids are maintained. Skull and upper cervical spine: Craniocervical junction within normal limits. Extensive and innumerable marrow replacing lesions seen throughout the calvarium, skull base, and visualized upper spine, compatible with history of multiple myeloma. Most prominent of these lesions positioned in the clivus and measures approximately 2 cm in size. No other scalp soft tissue abnormality. Sinuses/Orbits: Globes and orbital soft tissues within normal limits. Paranasal sinuses are largely clear. No significant mastoid effusion. Inner ear structures grossly normal. Other: None. IMPRESSION: 1. Acute intracranial hemorrhage involving the anterior-inferior right frontal convexity, stable from prior head CT. No associated edema or significant mass effect. 2. No other acute intracranial abnormality. 3. Innumerable marrow replacing lesions throughout the calvarium, skull base, and visualized upper spine, compatible with history of multiple myeloma. Electronically Signed   By: BJeannine BogaM.D.   On: 03/20/2020 07:35   CT CHEST ABDOMEN PELVIS W CONTRAST  Result Date: 03/20/2020 CLINICAL DATA:  Abdominal distension. EXAM: CT CHEST, ABDOMEN, AND PELVIS WITH CONTRAST TECHNIQUE: Multidetector CT imaging of the chest, abdomen and pelvis was performed following the standard protocol during bolus administration of intravenous contrast. CONTRAST:  1076mOMNIPAQUE IOHEXOL 300 MG/ML  SOLN COMPARISON:  February 14, 2020 FINDINGS: CT CHEST FINDINGS Cardiovascular: No significant vascular findings. There is mild cardiomegaly. No pericardial effusion. Mediastinum/Nodes: No enlarged  mediastinal, hilar, or axillary lymph nodes. Thyroid gland, trachea, and esophagus demonstrate no significant findings. Lungs/Pleura: Mild to moderate severity areas of atelectasis and/or infiltrate are seen within the inferior aspect of the left upper lobe posterior aspect of the superior portion of the right lower lobe and posterior aspect of the bilateral lung bases. There is a very small left pleural effusion. A 5.2 cm x 1.5 cm pleural based soft tissue mass is seen along the anterior aspect of the left upper lobe (axial  CT image 22, CT series number 3). A 1.4 cm pleural based soft tissue nodule is seen within this region on the prior study. Musculoskeletal: Chronic anterior second, third and fourth left rib fractures are noted. A chronic fracture of the right scapula is also seen. Numerous lytic lesions are seen throughout the thoracic spine with a chronic pathologic fractures noted at the levels of T6 and T9 vertebral bodies. These are seen on the prior study. CT ABDOMEN PELVIS FINDINGS Hepatobiliary: A 9 mm cystic appearing area is seen within the medial aspect of the right lobe of the liver. No gallstones, gallbladder wall thickening, or biliary dilatation. Pancreas: Unremarkable. No pancreatic ductal dilatation or surrounding inflammatory changes. Spleen: Normal in size without focal abnormality. Adrenals/Urinary Tract: Adrenal glands are unremarkable. Kidneys are normal in size, without renal or hydronephrosis. A 1.3 cm simple cyst is seen within the anteromedial aspect of the upper left kidney. Bladder is unremarkable. Stomach/Bowel: There is a small hiatal hernia. Appendix appears normal. No evidence of bowel wall thickening, distention, or inflammatory changes. A moderate amount of stool is seen within the distal sigmoid colon and rectum. Vascular/Lymphatic: No significant vascular findings are present. No enlarged abdominal or pelvic lymph nodes. Reproductive: The prostate gland is markedly enlarged.  Other: A 4.0 cm x 3.2 cm fat-containing right scrotal hernia is noted. No abdominopelvic ascites. Musculoskeletal: Numerous lytic lesions are seen throughout the lumbar spine and pelvis. IMPRESSION: 1. Mild to moderate severity bilateral areas of atelectasis and/or infiltrate. 2. 5.2 cm x 1.5 cm pleural based soft tissue mass along the anterior aspect of the left upper lobe. A 1.4 cm pleural based soft tissue nodule is seen within this region on the prior study. These findings are concerning for the presence of a primary lung malignancy. Further evaluation with a nuclear medicine PET/CT is recommended. 3. Findings consistent with multiple myeloma versus diffuse osseous metastasis, with chronic pathologic fractures at the levels of T6 and T9 vertebral bodies. 4. Markedly enlarged prostate gland. 5. 4.0 cm x 3.2 cm fat-containing right scrotal hernia. 6. Mild cardiomegaly. 7. Small hiatal hernia. 8. Aortic atherosclerosis. 9. Small simple cyst within the left kidney. Aortic Atherosclerosis (ICD10-I70.0). Electronically Signed   By: Virgina Norfolk M.D.   On: 03/20/2020 01:09   IR Removal Tun Cv Cath W/O FL  Result Date: 03/01/2020 INDICATION: Patient with history of multiple myeloma with acute kidney injury and placement of tunneled right internal jugular HD catheter on 02/17/2020; now with improving creatinine and urine output. Request received for HD catheter removal. EXAM: REMOVAL TUNNELED CENTRAL VENOUS CATHETER MEDICATIONS: None ANESTHESIA/SEDATION: None FLUOROSCOPY TIME:  None COMPLICATIONS: None immediate. PROCEDURE: Informed written consent was obtained from the patient after a thorough discussion of the procedural risks, benefits and alternatives. All questions were addressed. Maximal Sterile Barrier Technique was utilized including caps, mask, sterile gowns, sterile gloves, sterile drape, hand hygiene and skin antiseptic. A timeout was performed prior to the initiation of the procedure. The patient's  right chest and catheter was prepped and draped in a normal sterile fashion. Heparin was removed from both ports of catheter. Using gentle manual traction the cuff of the catheter was exposed and the catheter was removed in it's entirety. Pressure was held till hemostasis was obtained. A sterile dressing was applied. The patient tolerated the procedure well with no immediate complications. IMPRESSION: Successful catheter removal as described above. Read by: Rowe Robert, PA-C Electronically Signed   By: Jacqulynn Cadet M.D.   On: 03/01/2020 13:40  DG CHEST PORT 1 VIEW  Result Date: 03/21/2020 CLINICAL DATA:  Shortness of breath EXAM: PORTABLE CHEST 1 VIEW COMPARISON:  March 19, 2020 chest radiograph and chest CT March 20, 2020 FINDINGS: There is persistent atelectatic change in left mid lung and left base regions. There is also atelectatic change in the right mid lung and right base regions, new. There may be superimposed pneumonia in the left mid lung. There is a small left pleural effusion. Heart is upper normal in size with pulmonary vascularity normal. No adenopathy. Old healed rib trauma on the left, better seen by CT. IMPRESSION: Areas of atelectatic change bilaterally, overall increased bilaterally. Question superimposed pneumonia left mid lung. Small left pleural effusion. Stable cardiac silhouette. Electronically Signed   By: Lowella Grip III M.D.   On: 03/21/2020 08:09   DG CHEST PORT 1 VIEW  Result Date: 02/25/2020 CLINICAL DATA:  Left-sided rib pain, back pain EXAM: PORTABLE CHEST 1 VIEW COMPARISON:  01/26/2020 FINDINGS: Single frontal view of the chest demonstrates a stable cardiac silhouette. Left lower lobe consolidation is seen within the retrocardiac region. No effusion or pneumothorax. Right internal jugular dialysis catheter tip overlies superior vena cava. Small lytic lesions are seen throughout the thoracic cage and left scapula. The lytic thoracic spine lesion seen on  recent CT are more difficult to appreciate on portable x-ray. IMPRESSION: 1. Dense left lower lobe consolidation, favor atelectasis. 2. Diffuse lytic lesions throughout the visualized bony structures, consistent with multiple myeloma. Electronically Signed   By: Randa Ngo M.D.   On: 02/25/2020 17:52   DG Abd Portable 1V  Result Date: 03/15/2020 CLINICAL DATA:  Fever EXAM: X-RAY ABDOMEN 1 VIEW COMPARISON:  03/13/2020 FINDINGS: The bowel gas pattern is nonspecific with gaseous distention of loops of small bowel and colon scattered throughout the abdomen. There is an above average amount of stool throughout the ascending colon. There is no definite pneumatosis or free air. No radiopaque kidney stones. IMPRESSION: 1. Nonspecific bowel gas pattern with gaseous distention of loops of small bowel and colon scattered throughout the abdomen. 2. Above average amount of stool throughout the ascending colon. This is not significantly changed from 03/13/2020. Electronically Signed   By: Constance Holster M.D.   On: 04/01/2020 17:46   ECHOCARDIOGRAM COMPLETE  Result Date: 03/21/2020    ECHOCARDIOGRAM REPORT   Patient Name:   CLENNON NASCA Date of Exam: 03/21/2020 Medical Rec #:  [id number]   Height:       63.0 in Accession #:    [id number]  Weight:       126.5 lb Date of Birth:  [birth year]    BSA:          1.592 m Patient Age:    54 years    BP:           133/99 mmHg Patient Gender: M           HR:           112 bpm. Exam Location:  Inpatient Procedure: 2D Echo, Color Doppler and Cardiac Doppler Indications:    Bacteremia  History:        Patient has no prior history of Echocardiogram examinations.                 Risk Factors:Hypertension, Diabetes and Dyslipidemia.  Sonographer:    Raquel Sarna Senior RDCS Referring Phys: [id number] [facility]  Sonographer Comments: Poor apical window due to restricted mobility. IMPRESSIONS  1. Left ventricular ejection fraction, by estimation, is  70 to 75%. The left ventricle has hyperdynamic  function. The left ventricle has no regional wall motion abnormalities. There is mild left ventricular hypertrophy. Left ventricular diastolic parameters are consistent with Grade I diastolic dysfunction (impaired relaxation).  2. Right ventricular systolic function is normal. The right ventricular size is normal.  3. Left atrial size was moderately dilated.  4. The mitral valve is abnormal. Trivial mitral valve regurgitation.  5. The aortic valve is tricuspid. Aortic valve regurgitation is not visualized.  6. The inferior vena cava is normal in size with greater than 50% respiratory variability, suggesting right atrial pressure of 3 mmHg. Conclusion(s)/Recommendation(s): No evidence of valvular vegetations on this transthoracic echocardiogram. Would recommend a transesophageal echocardiogram to exclude infective endocarditis if clinically indicated. FINDINGS  Left Ventricle: Left ventricular ejection fraction, by estimation, is 70 to 75%. The left ventricle has hyperdynamic function. The left ventricle has no regional wall motion abnormalities. The left ventricular internal cavity size was normal in size. There is mild left ventricular hypertrophy. Left ventricular diastolic parameters are consistent with Grade I diastolic dysfunction (impaired relaxation). Indeterminate filling pressures. Right Ventricle: The right ventricular size is normal. No increase in right ventricular wall thickness. Right ventricular systolic function is normal. Left Atrium: Left atrial size was moderately dilated. Right Atrium: Right atrial size was normal in size. Pericardium: There is no evidence of pericardial effusion. Mitral Valve: The mitral valve is abnormal. There is mild thickening of the mitral valve leaflet(s). Trivial mitral valve regurgitation. Tricuspid Valve: The tricuspid valve is grossly normal. Tricuspid valve regurgitation is not demonstrated. Aortic Valve: The aortic valve is tricuspid. Aortic valve regurgitation is  not visualized. Pulmonic Valve: The pulmonic valve was grossly normal. Pulmonic valve regurgitation is not visualized. Aorta: The aortic root and ascending aorta are structurally normal, with no evidence of dilitation. Venous: The inferior vena cava is normal in size with greater than 50% respiratory variability, suggesting right atrial pressure of 3 mmHg. IAS/Shunts: No atrial level shunt detected by color flow Doppler.  LEFT VENTRICLE PLAX 2D LVIDd:         4.30 cm LVIDs:         2.40 cm LV PW:         1.10 cm LV IVS:        0.90 cm LVOT diam:     2.00 cm LV SV:         78 LV SV Index:   49 LVOT Area:     3.14 cm  RIGHT VENTRICLE RV S prime:     19.80 cm/s TAPSE (M-mode): 2.4 cm LEFT ATRIUM             Index       RIGHT ATRIUM           Index LA diam:        3.70 cm 2.32 cm/m  RA Area:     19.10 cm LA Vol (A2C):   46.0 ml 28.90 ml/m RA Volume:   53.30 ml  33.48 ml/m LA Vol (A4C):   82.5 ml 51.83 ml/m LA Biplane Vol: 63.7 ml 40.02 ml/m  AORTIC VALVE LVOT Vmax:   126.00 cm/s LVOT Vmean:  96.400 cm/s LVOT VTI:    0.248 m  AORTA Ao Root diam: 3.20 cm Ao Asc diam:  2.80 cm  SHUNTS Systemic VTI:  0.25 m Systemic Diam: 2.00 cm Lyman Bishop MD Electronically signed by Lyman Bishop MD Signature Date/Time: 03/21/2020/12:51:13 PM    Final    VAS  [place] LOWER EXTREMITY VENOUS (DVT)  Result Date: 03/03/2020  Lower Venous DVTStudy Indications: Immobility.  Performing Technologist: Griffin Basil RCT RDMS  Examination Guidelines: A complete evaluation includes B-mode imaging, spectral Doppler, color Doppler, and power Doppler as needed of all accessible portions of each vessel. Bilateral testing is considered an integral part of a complete examination. Limited examinations for reoccurring indications may be performed as noted. The reflux portion of the exam is performed with the patient in reverse Trendelenburg.  +---------+---------------+---------+-----------+----------+--------------+ RIGHT     CompressibilityPhasicitySpontaneityPropertiesThrombus Aging +---------+---------------+---------+-----------+----------+--------------+ CFV      Full           Yes      Yes                                 +---------+---------------+---------+-----------+----------+--------------+ SFJ      Full                                                        +---------+---------------+---------+-----------+----------+--------------+ FV Prox  Full                                                        +---------+---------------+---------+-----------+----------+--------------+ FV Mid   Full                                                        +---------+---------------+---------+-----------+----------+--------------+ FV DistalFull                                                        +---------+---------------+---------+-----------+----------+--------------+ PFV      Full                                                        +---------+---------------+---------+-----------+----------+--------------+ POP      Full           Yes      Yes                                 +---------+---------------+---------+-----------+----------+--------------+ PTV      Full                                                        +---------+---------------+---------+-----------+----------+--------------+ PERO     Full                                                        +---------+---------------+---------+-----------+----------+--------------+   +---------+---------------+---------+-----------+----------+--------------+  LEFT     CompressibilityPhasicitySpontaneityPropertiesThrombus Aging +---------+---------------+---------+-----------+----------+--------------+ CFV      Full           Yes      Yes                                 +---------+---------------+---------+-----------+----------+--------------+ SFJ      Full                                                         +---------+---------------+---------+-----------+----------+--------------+ FV Prox  Full                                                        +---------+---------------+---------+-----------+----------+--------------+ FV Mid   Full                                                        +---------+---------------+---------+-----------+----------+--------------+ FV DistalFull                                                        +---------+---------------+---------+-----------+----------+--------------+ PFV      Full                                                        +---------+---------------+---------+-----------+----------+--------------+ POP      Full           Yes      Yes                                 +---------+---------------+---------+-----------+----------+--------------+ PTV      Full                                                        +---------+---------------+---------+-----------+----------+--------------+ PERO     Full                                                        +---------+---------------+---------+-----------+----------+--------------+     Summary: RIGHT: - There is no evidence of deep vein thrombosis in the lower extremity.  - No cystic structure found in the popliteal fossa.  LEFT: - There is no evidence of deep vein thrombosis in the lower extremity.  - No  cystic structure found in the popliteal fossa.  *See table(s) above for measurements and observations. Electronically signed by Monica Martinez MD on 03/03/2020 at 3:26:05 PM.    Final     ASSESSMENT & PLAN Subhan Hoopes 54 y.o. male with medical history significant for HTN and HLD who presented with back pain and was found to have cord compression and lytic lesions concerning for multiple myeloma. The patient's imaging and blood work are most concerning for multiple myeloma.  Interestingly the M spike and the serum free light chain ratio is relatively  modest compared to the extent of the damage seen on CT scan.  The patient had significant hypercalcemia upon presentation. Bone marrow biopsy was performed on 02/19/2020 and was consistent with a plasma cell neoplasm.  Cytogenetics were normal.  He received radiation for impending cord compression which was completed on 03/02/2020.   Recommend CyBorD chemotherapy.  Since the patient was admitted to the inpatient rehab unit, chemotherapy was initiated on 03/15/2020.  He was started on Velcade and dexamethasone only with plan to add Cytoxan as an outpatient.  SPEP 02/15/2020: M protein 0.9 SFLC: kappa 37.5, Lambda 9.9, ratio 3.79 UPEP: 02/17/2020: M spike not observed.  Immunofixation showed IgG monoclonal protein with kappa light chain specificity   # Lytic Lesions Througout Skeleton/Hypercalcemia # Pathologic Fractures of Spine/Soft Tissue Masses in Spine #Concern for Multiple Myeloma --bone marrow biopsy performed 02/19/2020 to confirm diagnosis; results consistent with plasma cell neoplasm.  Normal cytogenetics. --M protein and Kappa ratio are modestly elevated compared the the extent of damage seen on CT/MRI. 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  We will continue to closely monitor and consider additional bisphosphonate therapy as an outpatient. --The patient has completed radiation to the impending cord compression.  Per radiation oncology, we are tapering dexamethasone.  Recommended taper is for dexamethasone 4 mg every 12 hours x1 week then 2 mg every 12 hours x1 week, and then 2 mg daily for 1 week, and then 2 mg every other day x2 weeks and then stop.  He started dexamethasone 2 mg every 12 hours on 03/18/2020.   --The patient was started on CyBorD chemotherapy.  Velcade and dexamethasone were given on 9/7.  We had plan to administer his  second dose of Velcade on 9/14, but given acute change in status and MSSA bacteremia, will hold chemotherapy this week. --Continue acyclovir 400 mg daily.  #MSSA bacteremia --From possible pneumonia. --Antibiotics per infectious disease.  #Thrombocytopenia --Likely due to underlying multiple myeloma and recent Velcade treatment although Velcade is not known to cause significant pancytopenia.  Could also be due to acute infection/bacteremia. --Continue supportive therapy with platelet transfusion for platelet count less than 20,000 or if he has severe, active clinical bleeding. --Recommend saline nasal spray and Afrin for intermittent epistaxis. --We will review peripheral blood smear again today. --We will also follow-up on HIT panel, but considered less likely given timing and the prior fluctuating platelet count. --Continue to hold anticoagulation.  #Anemia --Hemoglobin drifting down again today to 7.4. --Anemia likely due to underlying malignancy and recent Velcade although Velcade not known to cause significant pancytopenia.  Also due to acute infection and recurrent nosebleed. --Monitor hemoglobin and transfuse PRBCs for hemoglobin less than 7.  #Pleural-based soft tissue mass and markedly enlarged prostate noted on recent CT --Etiology of pleural-based soft tissue mass is unclear-could be plasmacytoma versus  a primary lung malignancy --Also noted to have significant prostatomegaly on CT scan --We will check PSA --The patient will need a biopsy of his pleural-based mass by IR once he is medically stable and once platelet count has improved to the point that IR will perform this procedure. --We will continue to watch the patient's clinical course and labs closely to determine timing of this.  Mikey Bussing, DNP, AGPCNP-BC, AOCNP Mon/Tues/Thurs/Fri 7am-5pm; Off Wednesdays Cell: [phone]  03/21/2020 2:59 PM

## 2020-03-21 NOTE — Progress Notes (Signed)
[facility] Progress Note  Name: Kyi Romanello MRN: [id number] DOB: March 22, 1966  Subjective:  Seen by nephrology on 9/12 and was started on NS at 50 ml/hr - appears now off.    Review of systems:  Shortness of breath - maybe a little better per pt Denies cp  Denies n/v  ---------- Background on consult:  HPI: The patient is a 54 y.o. year-old w/ hx of HTN, HL presented on 02/14/20 w/ back pain, L lower rib pain and constipation. Labs showed ^^Ca++, AKI, Na 125. CT showed diffuse lytic lesions c/w myeloma. Pt was admitted and was diagnosed w/ kappa LC multiple myeloma, admitted 8/08- 8/26.  He received XRT to spinal cord x 10 sessions. Renal saw him for AKI, creat admit was 4.9 and improved to 1.53, had 1-2 HD sessions. Had some low Na levels attributed to pseudohyponatremia from paraproteinemia +/- vol depletion or SIADH from some N/V he was having. He was dc'd and admitted to CIR on 8/26.  He had chemorx on 9/7 w/ next round scheduled for 9/14. He had nosebleed on 9/9 and got 2u prbc's.  On 9/11 pt spiked temp to 102 deg. Blood cx's from yesterday are growing Staph aureus, 4/4.  Labs yest showed Na 114, creat 1.0 and BUN 47, KUB ++ stool, UA neg.  UNa 11, Uosm 568.  Pt got IVF's at 50 cc/hr and Na improved to 121 this am.  Asked to see for hyponatremia.    Intake/Output Summary (Last 24 hours) at 03/21/2020 1122 Last data filed at 03/21/2020 0745 Gross per 24 hour  Intake 1813.33 ml  Output 1300 ml  Net 513.33 ml    Vitals:  Vitals:   03/20/20 2046 03/21/20 0037 03/21/20 0339 03/21/20 0735  BP:   105/67 (!) 133/99  Pulse:   (!) 127 (!) 114  Resp:   (!) 22 20  Temp: 98.8 F (37.1 C) 98.3 F (36.8 C) 98.2 F (36.8 C) 98.9 F (37.2 C)  TempSrc: Oral Oral Oral Oral  SpO2:   99% 98%     Physical Exam:  General adult male in bed in NAD at rest  HEENT normocephalic atraumatic extraocular movements intact sclera anicteric Neck supple trachea midline Lungs crackles; short  of breath with exertion   Heart tachycardic; no rub  Abdomen soft nontender nondistended Extremities he has no edema  Psych no anxiety or agitation  Neuro -awake; conversant; answers questions and follows commands  Medications reviewed  Labs:  BMP Latest Ref Rng & Units 03/21/2020 03/20/2020 03/20/2020  Glucose 70 - 99 mg/dL 153(H) 181(H) 183(H)  BUN 6 - 20 mg/dL 46(H) 47(H) 47(H)  Creatinine 0.61 - 1.24 mg/dL 1.31(H) 1.39(H) 1.34(H)  Sodium 135 - 145 mmol/L 123(L) 122(L) 120(L)  Potassium 3.5 - 5.1 mmol/L 3.6 3.8 3.7  Chloride 98 - 111 mmol/L 96(L) 96(L) 94(L)  CO2 22 - 32 mmol/L 18(L) 15(L) 15(L)  Calcium 8.9 - 10.3 mg/dL 8.1(L) 7.9(L) 7.9(L)     Assessment/Plan:   1. Hyponatremia - in pt w/ new dx mult myeloma, sp 1st round chemorx on 9/07.  Tprot not very high, serum osm is low suggesting true hyponatremia.  felt volume depleted on consult.  Meds reviewed, seroquel has low chance of SIADH, others not implicated. Kidneys appear normal by CT. TSH low normal. Cortisol ok  1. hydration limited by resp status.  Resume NS at 50 ml/hr x 12 hours 2. Would stop trazodone (have discontinued).  Can consider stopping seroquel  2. Hx AKI  due to myeloma - resolved for the most part down to creat 1.3 (4.7 on admssion).  3. Multiple myeloma - sp chemoRx 1st round on 9/07 4. Fevers/ staph sepsis - +SOB, ?gallop on exam. Note TTE ordered to r/o valve insuff and is pending 5. Dyspnea - CXR with question of ? Superimposed PNA.  Abx per primary team  Note TTE pending  Claudia Desanctis, MD 03/21/2020  11:42 AM

## 2020-03-21 NOTE — Progress Notes (Signed)
[facility]  Date of Admission:  03/16/2020      Total days of antibiotics 3  Day 2 Cefazolin           ASSESSMENT: Trevor Shelton is a 54 y.o. male with a h/o multiple myelomaadmitted from home with generalized weakness with left rib pain and back pain x 2 weeks. Found to have high fevers with pancytopenia (ANC 2.1); blood cultures returned positive for MSSA. CXR today indicates more of a left lobe pneumonia that may be causing secondary bacteremia. TTE has been done with results pending. 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   Please hold on any central line acces for now if possible until we clear bacteremia. Awaiting lab to come redraw blood cultures to ensure clearance.   On exam he has tachypnea, O2 sats 92% on 3LPM, S3/gallop? With bilateral rales/wheezing. D/W Dr. Lupita Leash and will start nebs; d/w nephrology about lasix.    PLAN: 1. Continue cefazolin  2. Acyclovir prophylaxis  3. Follow TTE  4. Follow repeat blood cultures not yet drawn today  5. Hold on PICC or central access for now 6. Nebs to start per primary    Active Problems:   AKI (acute kidney injury) ([place])   Pathologic fracture   Hyponatremia   Multiple myeloma not having achieved remission (HCC)   Diabetes mellitus ([place])   Hypertension   Constipation   Incomplete paraplegia (HCC)   Other pancytopenia (HCC)   Fever and neutropenia (HCC)   AMS (altered mental status)   Intracerebral hemorrhage   Staphylococcus aureus bacteremia    (feeding supplement) PROSource Plus  30 mL Oral BID BM   acyclovir  400 mg Oral Daily   albuterol  2.5 mg Nebulization TID AC & HS   baclofen  10 mg Oral QID   carvedilol  3.125 mg Oral BID WC   dexamethasone  2 mg Oral Q12H   fluconazole  100 mg Oral Daily   insulin aspart  0-9 Units Subcutaneous TID WC   insulin aspart  3 Units Subcutaneous TID WC   insulin glargine  15 Units  Subcutaneous QHS   lidocaine  2 patch Transdermal Q24H   midodrine  7.5 mg Oral BID WC   oxymetazoline  1 spray Each Nare BID   pantoprazole  40 mg Oral BID AC   polyethylene glycol  17 g Oral Daily   Ensure Max Protein  11 oz Oral BID BM   QUEtiapine  100 mg Oral QHS   senna-docusate  2 tablet Oral BID   sodium chloride  2 spray Each Nare Q1H while awake   sucralfate  1 g Oral TID WC & HS   traMADol  50 mg Oral Q6H    SUBJECTIVE: Tablet translator utilized for visit today with patient and his wife.  Most discussion was with his wife as he had patient care ongoing to clean up diarrhea episode.  Has had some blurry vision that has started over the last few days. This occurs to both eyes. He can see normally pretty well.    Review of Systems: Review of Systems  Constitutional: Positive for malaise/fatigue. Negative for fever.  HENT: Positive for nosebleeds.   Eyes: Positive for blurred vision. Negative for redness.  Respiratory: Positive for cough, sputum production, shortness of breath and wheezing.   Cardiovascular: Positive for leg swelling. Negative for chest pain.  Gastrointestinal: Positive  for abdominal pain and diarrhea. Negative for vomiting.  Genitourinary: Negative for dysuria and frequency.  Musculoskeletal: Negative for back pain, joint pain and myalgias.  Skin: Positive for rash (crusted lesions to upper lip).  Neurological: Negative for focal weakness and headaches.    Allergies  Allergen Reactions   Other     pollen   Penicillins     unknown    OBJECTIVE: Vitals:   03/20/20 2046 03/21/20 0037 03/21/20 0339 03/21/20 0735  BP:   105/67 (!) 133/99  Pulse:   (!) 127 (!) 114  Resp:   (!) 22 20  Temp: 98.8 F (37.1 C) 98.3 F (36.8 C) 98.2 F (36.8 C) 98.9 F (37.2 C)  TempSrc: Oral Oral Oral Oral  SpO2:   99% 98%   There is no height or weight on file to calculate BMI.  Physical Exam HENT:     Nose:     Comments: +nosebleed     Mouth/Throat:     Mouth: Mucous membranes are moist.     Pharynx: Oropharynx is clear.  Eyes:     General: No scleral icterus.    Extraocular Movements: Extraocular movements intact.     Conjunctiva/sclera: Conjunctivae normal.     Pupils: Pupils are equal, round, and reactive to light.  Cardiovascular:     Rate and Rhythm: Regular rhythm. Tachycardia present.     Heart sounds: Gallop present.      Comments: Don't appreciate a murmur per say but ?S3 Pulmonary:     Breath sounds: Wheezing and rales present.  Abdominal:     General: Bowel sounds are normal. There is distension.     Tenderness: There is abdominal tenderness.  Musculoskeletal:     Right lower leg: Edema (trace) present.     Left lower leg: Edema (trace ) present.  Skin:    General: Skin is warm and dry.     Capillary Refill: Capillary refill takes less than 2 seconds.     Comments: PIV sites in place all normal appearing, non-tender.   Neurological:     Mental Status: He is alert and oriented to person, place, and time.     Lab Results Lab Results  Component Value Date   WBC 4.4 03/20/2020   HGB 9.4 (L) 03/20/2020   HCT 26.1 (L) 03/20/2020   MCV 83.1 03/20/2020   PLT 28 (LL) 03/20/2020    Lab Results  Component Value Date   CREATININE 1.31 (H) 03/21/2020   BUN 46 (H) 03/21/2020   NA 123 (L) 03/21/2020   K 3.6 03/21/2020   CL 96 (L) 03/21/2020   CO2 18 (L) 03/21/2020    Lab Results  Component Value Date   ALT 30 04/05/2020   AST 38 03/12/2020   ALKPHOS 94 03/29/2020   BILITOT 0.6 04/07/2020     Microbiology: Recent Results (from the past 240 hour(s))  Culture, Urine     Status: Abnormal   Collection Time: 03/12/2020  2:28 PM   Specimen: Urine, Random  Result Value Ref Range Status   Specimen Description URINE, RANDOM  Final   Special Requests NONE  Final   Culture (A)  Final    <10,000 COLONIES/mL INSIGNIFICANT GROWTH Performed at [facility], [address], [place], [place]  [address]    Report Status 03/20/2020 FINAL  Final  Culture, blood (routine x 2)     Status: Abnormal (Preliminary result)   Collection Time: 03/27/2020  6:02 PM   Specimen: BLOOD  Result Value Ref Range Status   Specimen Description BLOOD RIGHT ANTECUBITAL  Final   Special Requests   Final    BOTTLES DRAWN AEROBIC AND ANAEROBIC Blood Culture adequate volume   Culture  Setup Time   Final    GRAM POSITIVE COCCI IN CLUSTERS IN BOTH AEROBIC AND ANAEROBIC BOTTLES Organism ID to follow CRITICAL RESULT CALLED TO, READ BACK BY AND VERIFIED WITH: Barth Kirks PHARMD, AT [place] 03/20/20 BY D. VANHOOK    Culture (A)  Final    STAPHYLOCOCCUS AUREUS SUSCEPTIBILITIES TO FOLLOW Performed at [facility], [place] [address], [place], [place] [address]    Report Status PENDING  Incomplete  Blood Culture ID Panel (Reflexed)     Status: Abnormal   Collection Time: 04/07/2020  6:02 PM  Result Value Ref Range Status   Enterococcus faecalis NOT DETECTED NOT DETECTED Final   Enterococcus Faecium NOT DETECTED NOT DETECTED Final   Listeria monocytogenes NOT DETECTED NOT DETECTED Final   Staphylococcus species DETECTED (A) NOT DETECTED Final    Comment: CRITICAL RESULT CALLED TO, READ BACK BY AND VERIFIED WITH: Barth Kirks PHARMD, AT 0740 03/20/20 BY D. VANHOOK    Staphylococcus aureus (BCID) DETECTED (A) NOT DETECTED Final    Comment: CRITICAL RESULT CALLED TO, READ BACK BY AND VERIFIED WITH: Barth Kirks PHARMD, AT 0740 03/20/20 BY D. VANHOOK    Staphylococcus epidermidis NOT DETECTED NOT DETECTED Final   Staphylococcus lugdunensis NOT DETECTED NOT DETECTED Final   Streptococcus species NOT DETECTED NOT DETECTED Final   Streptococcus agalactiae NOT DETECTED NOT DETECTED Final   Streptococcus pneumoniae NOT DETECTED NOT DETECTED Final   Streptococcus pyogenes NOT DETECTED NOT DETECTED Final   A.calcoaceticus-baumannii NOT DETECTED NOT DETECTED Final   Bacteroides fragilis NOT DETECTED NOT DETECTED Final    Enterobacterales NOT DETECTED NOT DETECTED Final   Enterobacter cloacae complex NOT DETECTED NOT DETECTED Final   Escherichia coli NOT DETECTED NOT DETECTED Final   Klebsiella aerogenes NOT DETECTED NOT DETECTED Final   Klebsiella oxytoca NOT DETECTED NOT DETECTED Final   Klebsiella pneumoniae NOT DETECTED NOT DETECTED Final   Proteus species NOT DETECTED NOT DETECTED Final   Salmonella species NOT DETECTED NOT DETECTED Final   Serratia marcescens NOT DETECTED NOT DETECTED Final   Haemophilus influenzae NOT DETECTED NOT DETECTED Final   Neisseria meningitidis NOT DETECTED NOT DETECTED Final   Pseudomonas aeruginosa NOT DETECTED NOT DETECTED Final   Stenotrophomonas maltophilia NOT DETECTED NOT DETECTED Final   Candida albicans NOT DETECTED NOT DETECTED Final   Candida auris NOT DETECTED NOT DETECTED Final   Candida glabrata NOT DETECTED NOT DETECTED Final   Candida krusei NOT DETECTED NOT DETECTED Final   Candida parapsilosis NOT DETECTED NOT DETECTED Final   Candida tropicalis NOT DETECTED NOT DETECTED Final   Cryptococcus neoformans/gattii NOT DETECTED NOT DETECTED Final   Meth resistant mecA/C and MREJ NOT DETECTED NOT DETECTED Final    Comment: Performed at [facility], [address], [place], [place] [address]  SARS Coronavirus 2 by RT PCR (hospital order, performed in [facility] hospital lab) Nasopharyngeal Nasopharyngeal Swab     Status: None   Collection Time: 04/04/2020  8:37 PM   Specimen: Nasopharyngeal Swab  Result Value Ref Range Status   SARS Coronavirus 2 NEGATIVE NEGATIVE Final    Comment: (NOTE) SARS-CoV-2 target nucleic acids are NOT DETECTED.  The SARS-CoV-2 RNA is generally detectable in upper and lower respiratory specimens during the acute phase of infection. The lowest  concentration of SARS-CoV-2 viral copies this assay can detect is 250 copies / mL. A negative result does not preclude SARS-CoV-2 infection and should not be used as the sole basis for  treatment or other patient management decisions.  A negative result may occur with improper specimen collection / handling, submission of specimen other than nasopharyngeal swab, presence of viral mutation(s) within the areas targeted by this assay, and inadequate number of viral copies (<250 copies / mL). A negative result must be combined with clinical observations, patient history, and epidemiological information.  Fact Sheet for Patients:   StrictlyIdeas.no  Fact Sheet for Healthcare Providers: BankingDealers.co.za  This test is not yet approved or  cleared by the [place] FDA and has been authorized for detection and/or diagnosis of SARS-CoV-2 by FDA under an Emergency Use Authorization (EUA).  This EUA will remain in effect (meaning this test can be used) for the duration of the COVID-19 declaration under Section 564(b)(1) of the Act, 21 U.S.C. section 360bbb-3(b)(1), unless the authorization is terminated or revoked sooner.  Performed at [facility], [place] [address], [place], [place] [address]   MRSA PCR Screening     Status: None   Collection Time: 03/20/20  4:21 AM   Specimen: Nasopharyngeal  Result Value Ref Range Status   MRSA by PCR NEGATIVE NEGATIVE Final    Comment:        The GeneXpert MRSA Assay (FDA approved for NASAL specimens only), is one component of a comprehensive MRSA colonization surveillance program. It is not intended to diagnose MRSA infection nor to guide or monitor treatment for MRSA infections. Performed at [facility], [place] [address], [place], [place] [address]   Respiratory Panel by PCR     Status: None   Collection Time: 03/20/20  4:21 AM   Specimen: Nasopharyngeal Swab; Respiratory  Result Value Ref Range Status   Adenovirus NOT DETECTED NOT DETECTED Final   Coronavirus 229E NOT DETECTED NOT DETECTED Final    Comment: (NOTE) The Coronavirus on the Respiratory Panel, DOES  NOT test for the novel  Coronavirus (2019 nCoV)    Coronavirus HKU1 NOT DETECTED NOT DETECTED Final   Coronavirus NL63 NOT DETECTED NOT DETECTED Final   Coronavirus OC43 NOT DETECTED NOT DETECTED Final   Metapneumovirus NOT DETECTED NOT DETECTED Final   Rhinovirus / Enterovirus NOT DETECTED NOT DETECTED Final   Influenza A NOT DETECTED NOT DETECTED Final   Influenza B NOT DETECTED NOT DETECTED Final   Parainfluenza Virus 1 NOT DETECTED NOT DETECTED Final   Parainfluenza Virus 2 NOT DETECTED NOT DETECTED Final   Parainfluenza Virus 3 NOT DETECTED NOT DETECTED Final   Parainfluenza Virus 4 NOT DETECTED NOT DETECTED Final   Respiratory Syncytial Virus NOT DETECTED NOT DETECTED Final   Bordetella pertussis NOT DETECTED NOT DETECTED Final   Chlamydophila pneumoniae NOT DETECTED NOT DETECTED Final   Mycoplasma pneumoniae NOT DETECTED NOT DETECTED Final    Comment: Performed at [facility], [place], [place], [place] [address]    Janene Madeira, MSN, NP-C [facility] [email or web address] Pager: [phone] Office: [phone] [place]: [phone]

## 2020-03-21 NOTE — Evaluation (Signed)
Physical Therapy Evaluation Patient Details Name: Trevor Shelton MRN: [id number] DOB: 01/05/1966 Today's Date: 03/21/2020   History of Present Illness  Pt is 54 yo male with PMH of HTN and HLD.  Pt with recent admission 02/14/20-03/03/20 with neoplasm consistent with multiple myeloma with multiple spinal lesions, L rib fx 9 and 10, and cord compression.  Pt on Decadron for cord compression.  Pt was at inpt rehab and developed nose bleed and fever. Has been admitted with fever, multiple myeloma, AMS, AKI, and pancytopenia.  Pt found to have small intracrainal hemorrhage and severe sepesis with PNE.  Clinical Impression   Pt admitted with above diagnosis. Pt presenting with significant weakness throughout (particulary in LEs), decreased mobility, activity tolerance, balance, and coordination.  Demonstrating ataxic movements of LEs with MMT.  Sensation appears to be intact.  He had flat affect and required increased cues for sequencing and safety.  Required mod A of 2 for transfers and was able to perform multiple stands.  Wife reports he seems weaker than he was at rehab - potentially due to sepsis and ICH.  Pt currently with functional limitations due to the deficits listed below (see PT Problem List). Pt will benefit from skilled PT to increase their independence and safety with mobility to allow discharge to the venue listed below.       Follow Up Recommendations CIR    Equipment Recommendations  Wheelchair (measurements PT);Wheelchair cushion (measurements PT);3in1 (PT)    Recommendations for Other Services Rehab consult     Precautions / Restrictions Precautions Precautions: Back;Fall (back precautions for comfort) Precaution Comments: Significant BLE weakness and ataxia-like movements; back and rib precautions for comfort due to fxs      Mobility  Bed Mobility Overal bed mobility: Needs Assistance Bed Mobility: Sit to Supine;Rolling;Sidelying to Sit Rolling: Mod assist Sidelying to sit:  Mod assist;+2 for physical assistance       General bed mobility comments: cues for sequencing; A for L LE and to lift trunk  Transfers Overall transfer level: Needs assistance   Transfers: Sit to/from Stand;Stand Pivot Transfers Sit to Stand: Mod assist;+2 physical assistance Stand pivot transfers: Total assist       General transfer comment: Performed sit to stand x 3 from bed and x 1 from stedy.  Required mod A of 2 to stand with support with gait belt and under pelvis to stand upright -all stands in STEDY with knees blocked; L hip externally rotates pt with heavy reliance on R LE and bil UE.  Ambulation/Gait                Stairs            Wheelchair Mobility    Modified Rankin (Stroke Patients Only) Modified Rankin (Stroke Patients Only) Pre-Morbid Rankin Score: No symptoms Modified Rankin: Severe disability     Balance Overall balance assessment: Needs assistance Sitting-balance support: Feet supported;Bilateral upper extremity supported Sitting balance-Leahy Scale: Poor Sitting balance - Comments: requiring bil UE support; able to maintain static sitting without assistance for 30 sec at a time; required min A as fatigued   Standing balance support: Bilateral upper extremity supported;During functional activity Standing balance-Leahy Scale: Zero Standing balance comment: Requiring bil UE support, knees blocked, and support under buttock/pelvis to maintain standing                             Pertinent Vitals/Pain Pain Assessment: No/denies pain    Home  Living Family/patient expects to be discharged to:: Inpatient rehab Living Arrangements: Spouse/significant other;Children Available Help at Discharge: Family;Available 24 hours/day Type of Home: House Home Access: Stairs to enter Entrance Stairs-Rails: Can reach both;Right;Left Entrance Stairs-Number of Steps: 5 Home Layout: One level Home Equipment: None      Prior Function Level  of Independence: Independent         Comments: Pt was ambulatory and independent with ADLs at home.  Since last admission pt had inpatient rehab and requiring mod-max A of 2 for transfers.     Hand Dominance        Extremity/Trunk Assessment   Upper Extremity Assessment Upper Extremity Assessment: Defer to OT evaluation    Lower Extremity Assessment Lower Extremity Assessment: LLE deficits/detail;RLE deficits/detail RLE Deficits / Details: ROM: WFL; MMT: ankle DF 1/5, knee ext 4-/5, hip 2/5; ataxic RLE Sensation: WNL LLE Deficits / Details: ROM: WFL; MMT: ankle DF 1/5, knee ext 4-/5, hip 1/5; ataxic LLE Sensation: WNL    Cervical / Trunk Assessment Cervical / Trunk Assessment: Kyphotic  Communication   Communication: Interpreter utilized Fara Chute [phone])  Cognition Arousal/Alertness: Awake/alert Behavior During Therapy: Flat affect Overall Cognitive Status: Impaired/Different from baseline                                 General Comments: Wife reporting pt seems confused, weak, and in despair.  Pt was very soft spoken with minimal communitcation - overall weak appearance, but able to answer questions.  Wife provided history.      General Comments General comments (skin integrity, edema, etc.): VSS;  pt with nose bleed - RN aware, reports has been ongoing    Exercises     Assessment/Plan    PT Assessment Patient needs continued PT services  PT Problem List Decreased strength;Decreased mobility;Pain;Decreased balance;Decreased knowledge of use of DME;Decreased activity tolerance;Decreased safety awareness;Decreased coordination;Decreased cognition       PT Treatment Interventions Therapeutic activities;Therapeutic exercise;Patient/family education;Balance training;Functional mobility training;Neuromuscular re-education;DME instruction;Stair training;Wheelchair mobility training    PT Goals (Current goals can be found in the Care Plan section)  Acute  Rehab PT Goals Patient Stated Goal: return to CIR per wife PT Goal Formulation: With patient/family Time For Goal Achievement: 04/04/20 Potential to Achieve Goals: Fair    Frequency Min 4X/week   Barriers to discharge Inaccessible home environment      Co-evaluation PT/OT/SLP Co-Evaluation/Treatment: Yes Reason for Co-Treatment: Complexity of the patient's impairments (multi-system involvement);For patient/therapist safety PT goals addressed during session: Mobility/safety with mobility OT goals addressed during session: ADL's and self-care       AM-PAC PT "6 Clicks" Mobility  Outcome Measure Help needed turning from your back to your side while in a flat bed without using bedrails?: A Lot Help needed moving from lying on your back to sitting on the side of a flat bed without using bedrails?: Total Help needed moving to and from a bed to a chair (including a wheelchair)?: Total Help needed standing up from a chair using your arms (e.g., wheelchair or bedside chair)?: Total Help needed to walk in hospital room?: Total Help needed climbing 3-5 steps with a railing? : Total 6 Click Score: 7    End of Session Equipment Utilized During Treatment: Gait belt Activity Tolerance: Patient tolerated treatment well Patient left: with call bell/phone within reach;with family/visitor present;in chair;with chair alarm set;Other (comment) (multiple pillows to support) Nurse Communication: Mobility status;Need for lift equipment (  STEDY assist of 2) PT Visit Diagnosis: Muscle weakness (generalized) (M62.81);Difficulty in walking, not elsewhere classified (R26.2);Other abnormalities of gait and mobility (R26.89)    Time: 3709-6438 PT Time Calculation (min) (ACUTE ONLY): 32 min   Charges:   PT Evaluation $PT Eval Moderate Complexity: 1 Melina Schools, PT Acute Rehab Services Pager [phone] [facility] [phone]    Karlton Lemon 03/21/2020, 1:16 PM

## 2020-03-21 NOTE — Evaluation (Signed)
Occupational Therapy Evaluation Patient Details Name: Trevor Shelton MRN: [id number] DOB: 1965-10-17 Today's Date: 03/21/2020    History of Present Illness Pt is 54 yo male with PMH of HTN and HLD.  Pt with recent admission 02/14/20-03/03/20 with neoplasm consistent with multiple myeloma with multiple spinal lesions, L rib fx 9 and 10, and cord compression.  Pt on Decadron for cord compression.  Pt was at inpt rehab and developed nose bleed and fever. Has been admitted with fever, multiple myeloma, AMS, AKI, and pancytopenia.  Pt found to have small intracrainal hemorrhage and severe sepesis with PNE.   Clinical Impression   Patient admitted with the above diagnoses.  Presents with further functional limitations due to deficits listed below and new onset CVA.  Patient had presented from CIR, and spouse is hoping he can return there.  Spouse acknowledges new cognitive impairment as well.  The patient was at a Mod A level in [place], but now presents at Max A to near Dep level for some ADL and toilet skills.  Patient has been placed on enteric precautions.  Continue to follow in the acute setting.  CIR referral is recommended.      Follow Up Recommendations  CIR    Equipment Recommendations  3 in 1 bedside commode;Wheelchair (measurements OT);Wheelchair cushion (measurements OT)    Recommendations for Other Services Rehab consult     Precautions / Restrictions Precautions Precautions: Fall Precaution Comments: Significant BLE weakness and ataxia-like movements; back and rib precautions for comfort due to fxs Restrictions Other Position/Activity Restrictions: Patient tends to lean L      Mobility Bed Mobility Overal bed mobility: Needs Assistance Bed Mobility: Supine to Sit Rolling: Mod assist;+2 for physical assistance Sidelying to sit: Mod assist;+2 for physical assistance       General bed mobility comments: cues for sequencing; A for L LE and to lift trunk  Transfers Overall transfer  level: Needs assistance   Transfers: Sit to/from Stand;Stand Pivot Transfers Sit to Stand: Mod assist;+2 physical assistance Stand pivot transfers: Total assist       General transfer comment: Performed sit to stand x 3 from bed and x 1 from stedy.  Required mod A of 2 to stand with support with gait belt and under pelvis to stand upright -all stands in STEDY with knees blocked; L hip externally rotates pt with heavy reliance on R LE and bil UE.    Balance Overall balance assessment: Needs assistance Sitting-balance support: Feet supported;Bilateral upper extremity supported Sitting balance-Leahy Scale: Poor Sitting balance - Comments: requiring bil UE support; able to maintain static sitting without assistance for 30 sec at a time; required min A as fatigued   Standing balance support: Bilateral upper extremity supported;During functional activity Standing balance-Leahy Scale: Zero Standing balance comment: Requiring bil UE support, knees blocked, and support under buttock/pelvis to maintain standing                           ADL either performed or assessed with clinical judgement   ADL   Eating/Feeding: Minimal assistance;Bed level   Grooming: Moderate assistance;Wash/dry hands;Wash/dry face   Upper Body Bathing: Moderate assistance;Bed level   Lower Body Bathing: Maximal assistance;Bed level   Upper Body Dressing : Maximal assistance;Bed level   Lower Body Dressing: Total assistance;Bed level Lower Body Dressing Details (indicate cue type and reason): patient unalbe to support himslef in sit without external assist.  Leaning to the left.  Functional mobility during ADLs: Moderate assistance;+2 for physical assistance       Vision Baseline Vision/History: No visual deficits Patient Visual Report: No change from baseline       Perception     Praxis      Pertinent Vitals/Pain Pain Assessment: Faces Faces Pain Scale: Hurts a little  bit Pain Location: ribs Pain Descriptors / Indicators: Grimacing Pain Intervention(s): Monitored during session;Repositioned     Hand Dominance Right   Extremity/Trunk Assessment Upper Extremity Assessment Upper Extremity Assessment: Generalized weakness;RUE deficits/detail RUE Deficits / Details: RUE weaker than L.  He is able to move through full range against gravity, but 3+/5 grossly for MMT. RUE Sensation: WNL (deny's sensory loss.) RUE Coordination: WNL (slow and purposeful)      Cervical / Trunk Assessment Cervical / Trunk Assessment: Kyphotic   Communication Communication Communication: Interpreter utilized   Cognition Arousal/Alertness: Awake/alert Behavior During Therapy: Flat affect Overall Cognitive Status: Impaired/Different from baseline                                 General Comments: Wife reporting pt seems confused, weak, and in despair.  Wife provided history.   General Comments  VSS;  pt with nose bleed - RN aware, reports has been ongoing    Exercises     Shoulder Instructions      Home Living Family/patient expects to be discharged to:: Inpatient rehab Living Arrangements: Spouse/significant other;Children Available Help at Discharge: Family;Available 24 hours/day Type of Home: House Home Access: Stairs to enter [employer] of Steps: 4 Entrance Stairs-Rails: Can reach both;Right;Left Home Layout: One level     Bathroom Shower/Tub: Occupational psychologist: Standard Bathroom Accessibility: Yes How Accessible: Accessible via walker Home Equipment: None      Lives With: Spouse    Prior Functioning/Environment Level of Independence: Independent        Comments: Pt was ambulatory and independent with ADLs at home.  Since last admission pt had inpatient rehab and requiring mod-max A of 2 for transfers.        OT Problem List: Decreased strength;Decreased activity tolerance;Impaired balance (sitting  and/or standing);Decreased safety awareness;Decreased knowledge of use of DME or AE;Decreased knowledge of precautions;Pain;Decreased coordination;Decreased cognition;Impaired UE functional use      OT Treatment/Interventions: Self-care/ADL training;Therapeutic exercise;Energy conservation;DME and/or AE instruction;Manual therapy;Modalities;Therapeutic activities;Patient/family education;Balance training;Neuromuscular education    OT Goals(Current goals can be found in the care plan section) Acute Rehab OT Goals Patient Stated Goal: Wife wishes to continue rehab. OT Goal Formulation: With family Time For Goal Achievement: 04/07/20 Potential to Achieve Goals: Fair ADL Goals Pt Will Perform Grooming: with supervision;sitting Pt Will Perform Upper Body Bathing: with supervision;sitting Pt Will Perform Upper Body Dressing: with min guard assist;sitting Pt/caregiver will Perform Home Exercise Program: Increased strength;Both right and left upper extremity;With theraband;With Supervision  OT Frequency: Min 2X/week   Barriers to D/C:    medical status       Co-evaluation PT/OT/SLP Co-Evaluation/Treatment: Yes Reason for Co-Treatment: Complexity of the patient's impairments (multi-system involvement);For patient/therapist safety PT goals addressed during session: Mobility/safety with mobility OT goals addressed during session: ADL's and self-care      AM-PAC OT "6 Clicks" Daily Activity     Outcome Measure Help from another person eating meals?: A Little   Help from another person toileting, which includes using toliet, bedpan, or urinal?: A Lot Help from another person bathing (including washing,  rinsing, drying)?: A Lot Help from another person to put on and taking off regular upper body clothing?: A Lot Help from another person to put on and taking off regular lower body clothing?: Total 6 Click Score: 10   End of Session Equipment Utilized During Treatment: Gait belt Nurse  Communication: Need for lift equipment  Activity Tolerance:   Patient left: in chair;with call bell/phone within reach;with bed alarm set;with family/visitor present  OT Visit Diagnosis: Unsteadiness on feet (R26.81);Other abnormalities of gait and mobility (R26.89);Other symptoms and signs involving the nervous system (R29.898);Muscle weakness (generalized) (M62.81);Pain;Feeding difficulties (R63.3);Other symptoms and signs involving cognitive function                Time: 1152-1220 OT Time Calculation (min): 28 min Charges:  OT General Charges $OT Visit: 1 Visit OT Evaluation $OT Eval Moderate Complexity: 1 Mod  03/21/2020  Rich, OTR/L  Acute Rehabilitation Services  Office:  [place] 03/21/2020, 1:34 PM

## 2020-03-21 NOTE — Progress Notes (Signed)
Pt began choking with PO meds, not following commands. Will page and request PO converted to IV meds.   Elaina Hoops, RN

## 2020-03-21 NOTE — Progress Notes (Addendum)
HOSPITAL MEDICINE OVERNIGHT EVENT NOTE    Notified by nursing that patient had become progressively more confused and lethargic since change of shift.  Nursing is concerned the patient is unable to tolerate oral intake and therefore has asked that we consider transitioning his evening meds to IV formulations.  Patient has a known history of a recent diagnosis of intracranial hemorrhage.  Considering this, ordering stat CT imaging of the head.  Furthermore, patient has recently been treated for substantial hyponatremia.  Central pontine moniliasis status post sodium correction should always be considered however patient sodium levels increased in the first 24 hours from 114 to a peak of 122, notably only a correction of 8 which should minimize any risk of brain injury.  Meds reviewed, it seems that patient is on both scheduled baclofen and Ultram, progressive lethargy and confusion may be due to polypharmacy.  I have discontinued these medications in addition to discontinuation of as needed Seroquel and as needed clonazepam.  Finally, patient has been made n.p.o. for now.  Will monitor for clinical improvement.  Vernelle Emerald  MD [facility]   ADDENDUM (12:45AM 9/14)  Patient continuing to deteriorate, now not responsive to painful stimuli.   Patient is protecting his airway however, with ABG found to have pH of 7.44, PCO2 of 29 and PO2 of 88.7.  Lactic acid, CBCD, CMP and troponin ordered.  EKG obtained revealing sinus tachycardia without dynamic ST segment changes.  CT head performed revealing continued resolution of previously identified intracranial hemorrhage.  Chest x-ray performed revealing patchy left-sided infiltrates with milder right-sided infiltrates concerning for bilateral pneumonia.  During my physical evaluation of the patient, he is not responsive to verbal or painful stimuli.  Scattered rales bilaterally with rhonchi.  Heart is tachycardic but regular.  Patient  is exhibiting occasional periods of shaking of the arms and legs up to the ceilings but otherwise is unresponsive.  Case discussed with CCM as well as Dr. Lorraine Lax with neurology.  Possible seizure activity with status epilepticus.  Dr. Lorraine Lax recommended initiation of 1 g of Keppra now.  Transferring patient to ICU for close monitoring and EEG.  [name] Russell [place]

## 2020-03-21 NOTE — Progress Notes (Signed)
STROKE TEAM PROGRESS NOTE      INTERVAL HISTORY .  Patient looks good today with increased work of breathing and he states he is feeling tired.  His blood cultures now show MSSA bacteremia.  ID has been consulted.  His wife are  at the bedside.  Televideo again interpreter facilitated this visit today..  She is having some spontaneous nasal bleeding and lip bleeding.  Platelet count is remain low at 25,000 and hemoglobin is down to 7.4.  Chest x-ray shows increased atelectasis bilaterally raising question of superimposed pneumonia in the left midlung.  CT scan of the head yesterday interval decrease in the small anterior inferior right frontal lobe intracranial hemorrhage.  He complains of mild dull headache today which is not incapacitating  OBJECTIVE Vitals:   03/21/20 0339 03/21/20 0735 03/21/20 1150 03/21/20 1350  BP: 105/67 (!) 133/99 127/85   Pulse: (!) 127 (!) 114 (!) 110   Resp: (!) '22 20 20   ' Temp: 98.2 F (36.8 C) 98.9 F (37.2 C)    TempSrc: Oral Oral Oral   SpO2: 99% 98% 92% 96%    CBC:  Recent Labs  Lab 03/20/20 0040 03/20/20 0040 03/20/20 1134 03/21/20 1359  WBC 4.4  --   --  2.5*  NEUTROABS 3.5  --   --  2.2  HGB 9.4*  --   --  7.4*  HCT 26.1*  --   --  21.2*  MCV 83.1  --   --  83.1  PLT 38*   < > 28* 25*   < > = values in this interval not displayed.    Basic Metabolic Panel:  Recent Labs  Lab 03/21/20 0240 03/21/20 1359  NA 123* 123*  K 3.6 3.6  CL 96* 96*  CO2 18* 16*  GLUCOSE 153* 219*  BUN 46* 40*  CREATININE 1.31* 1.05  CALCIUM 8.1* 8.0*    Lipid Panel:     Component Value Date/Time   CHOL 175 02/25/2020 0340   TRIG 142 02/25/2020 0340   HDL 38 (L) 02/25/2020 0340   CHOLHDL 4.6 02/25/2020 0340   VLDL 28 02/25/2020 0340   LDLCALC 109 (H) 02/25/2020 0340   HgbA1c:  Lab Results  Component Value Date   HGBA1C 6.5 (H) 02/18/2020   Urine Drug Screen: No results found for: LABOPIA, COCAINSCRNUR, LABBENZ, AMPHETMU, THCU, LABBARB   Alcohol Level No results found for: [facility]  IMAGING  DG Chest 2 View 03/28/2020 IMPRESSION:  Areas of patchy atelectasis bilaterally. No edema or airspace opacity. Cardiac silhouette within normal limits.   CT HEAD WO CONTRAST 03/20/2020 IMPRESSION:  1. Small lobulated hyperdensity immediately subjacent to the right frontal calvarium as above. Highly suspicious for a small bleed. No significant mass effect or edema.  2. Multiple scattered lucent lesions throughout the calvarium and visualized upper cervical spine, consistent with history of multiple myeloma.  3. No other acute intracranial abnormality.   Repeat CT HEAD WO CONTRAST - pending   MR BRAIN W WO CONTRAST 03/20/2020 IMPRESSION:  1. Acute intracranial hemorrhage involving the anterior-inferior right frontal convexity, stable from prior head CT. No associated edema or significant mass effect.  2. No other acute intracranial abnormality.  3. Innumerable marrow replacing lesions throughout the calvarium, skull base, and visualized upper spine, compatible with history of multiple myeloma.   CT CHEST ABDOMEN PELVIS W CONTRAST 03/20/2020 IMPRESSION:  1. Mild to moderate severity bilateral areas of atelectasis and/or infiltrate.  2. 5.2 cm x 1.5 cm pleural based  soft tissue mass along the anterior aspect of the left upper lobe. A 1.4 cm pleural based soft tissue nodule is seen within this region on the prior study. These findings are concerning for the presence of a primary lung malignancy. Further evaluation with a nuclear medicine PET/CT is recommended.  3. Findings consistent with multiple myeloma versus diffuse osseous metastasis, with chronic pathologic fractures at the levels of T6 and T9 vertebral bodies.  4. Markedly enlarged prostate gland.  5. 4.0 cm x 3.2 cm fat-containing right scrotal hernia.  6. Mild cardiomegaly.  7. Small hiatal hernia.  8. Aortic atherosclerosis.  9. Small simple cyst within the left kidney.   Aortic Atherosclerosis (ICD10-I70.0).   DG Abd Portable 1V 03/14/2020 IMPRESSION:  1. Nonspecific bowel gas pattern with gaseous distention of loops of small bowel and colon scattered throughout the abdomen.  2. Above average amount of stool throughout the ascending colon.   Bilateral Lower Extremity Venous Dopplers - 03/03/20 - negative for DVT  ECG - ST rate 126 BPM. (See cardiology reading for complete details)  PHYSICAL EXAM Blood pressure 127/85, pulse (!) 110, temperature 98.9 F (37.2 C), temperature source Oral, resp. rate 20, SpO2 96 %. Mildly obese middle-aged Hispanic male not in distress. . Afebrile. Head is nontraumatic. Neck is supple without bruit.    Cardiac exam no murmur or gallop. Lungs are clear to auscultation. Distal pulses are well felt.  There is mild healed blood over both upper and lower lips.  Neurological Exam: Exam is limited due to necessity to use televideo interpreter.  He is awake alert seems oriented to time place and person.  Speech appears clear without dysarthria.  Extraocular movements are full range without nystagmus.  He follows simple midline and one-step commands well.  No facial weakness.  Tongue midline. Motor system exam able to move all 4 extremities equally well against gravity without focal weakness.  Shoulder elevation limited due to pain on the right.  Sensation intact bilaterally.  Reflexes symmetric.  Plantars downgoing.  Gait not tested. ASSESSMENT/PLAN Trevor Shelton is a 54 y.o. male with history of hypertension, AKI, DM, hypercalcemia, and hyperlipidemia recently diagnosed with metastatic multiple myeloma complicated by lytic lesions of the spine, as well as other painful lesions, treated with radiation of spine lesions due to concern for impending cord compression and CyBorD chemotherapy with prophylactic acyclovir, new possible primary lung malignancy, recent epistaxis requiring transfusion for pancytopenia, a head CT performed for AMS  demonstrated a lesion in the right frontal lobe concerning for possible hemorrhage prompting a neurology consult. Recent symptoms include blurred vision, lower extremity weakness, incontinence of urine more than bowel, significant shoulder, back, and leg pain, as well as decreased sensation in the sacral/genital region. He did not receive IV t-PA due to hemorrhage.   Stroke: Acute intracranial hemorrhage involving the anterior-inferior right frontal convexity-likely due to thrombocytopenia from effect of multiple myeloma and chemotherapy for its treatment.-Doubt this represents hypertensive, hemorrhagic infarct or septic emboli  Resultant no focal neuro deficits  Code Stroke CT Head - not ordered  CT head - Small lobulated hyperdensity immediately subjacent to the right frontal calvarium as above. Highly suspicious for a small bleed.  CT Head WO -decreased size of right frontal anteroinferior convexity hemorrhage.    MRI head - Acute intracranial hemorrhage involving the anterior-inferior right frontal convexity, stable from prior head CT.   MRA head - not ordered  CTA H&N - not ordered  CT Perfusion - not ordered  CT  Chest Abd and Pelvus - 5.2 cm x 1.5 cm pleural based soft tissue mass along the anterior aspect of the left upper lobe. A 1.4 cm pleural based soft tissue nodule is seen within this region on the prior study. These findings are concerning for the presence of a primary lung malignancy.  Carotid Doppler - not indicated  2D Echo - not indicated  [employer] Virus 2 - negative  LDL - 109  HgbA1c - 6.5  UDS - not ordered  VTE prophylaxis - SCDs Diet  Diet Order            Diet Carb Modified Fluid consistency: Thin; Room service appropriate? No  Diet effective now                 No antithrombotic prior to admission, now on No antithrombotic  Ongoing aggressive stroke risk factor management  Therapy recommendations:  pending  Disposition:   Pending  Hypertension  Home BP meds: Norvasc ; Coreg  Current BP meds: Coreg  Stable  SBP goal < 140 mm Hg initially . Long-term BP goal normotensive  Hyperlipidemia  Home Lipid lowering medication: Zocor 20 mg daily  LDL 109, goal < 70  Current lipid lowering medication: Lipitor 20 mg daily -> will D/C - contraindicated in [place]  Continue statin at discharge  Diabetes (Decadron)  Home diabetic meds: insulin  Current diabetic meds: insulin  HgbA1c 6.5, goal < 7.0 Recent Labs    03/21/20 0538 03/21/20 0734 03/21/20 1236  GLUCAP 141* 144* 156*    Other Stroke Risk Factors  Previous ETOH use  Previous drug use  Other Active Problems  Code status - Full code  Aortic Atherosclerosis (ICD10-I70.0)   AKI - creatinine - 127->124  Anemia - Hgb - 9.4 Thrombocytopenia - platelets - 38 Hyponatremia - Na - 121 Hypocalcemia - 7.9 Tachycardia - 115 - 143 - likely due to fever Fever - 102.3 - Maxipime ; Vancomycin ; Diflucan Multiple Myeloma Possible primary lung malignancy NPO - tube feedings  Hospital day # 2 Presented with confusion disorientation without any other focal deficits and CT scan and subsequently MRI confirmed small 1 cm right frontal parenchymal hemorrhage with slight subdural extension.  Etiology is likely related to severe thrombocytopenia which is a result of multiple myeloma and its resultant chemotherapy side effect or possibly septic emboli from endocarditis given new finding of MSSA bacteremia..  . Patient may benefit with TEE to look for endocarditis but his medical condition appears to be declining and he may not be stable for this at the present time.  Long discussion with Dr. Maren Beach as well as with patient and his wife using Spanish language interpreter and answered questions.     Greater than 50% time during this 25-minute visit was spent on counseling and coordination of care about his intracerebral hemorrhage and thrombocytopenia and  answering questions.  Stroke team will follow from a distance.  Kindly call if questions. Antony Contras, MD To contact Stroke Continuity provider, please refer to [email or web address] After hours, contact General Neurology  Addendum :  Repeat CT scan of the head shows decrease in size of the right frontal superficial cortical hemorrhage without significant mass-effect or worsening. I do not believe that the transfusion is indicated at the present time from neurological standpoint.  If hematologist believes it is necessary he may give it. Antony Contras, MD Medical Director [facility] Pager: [phone] 03/21/2020 3:38 PM

## 2020-03-22 ENCOUNTER — Inpatient Hospital Stay (HOSPITAL_COMMUNITY): Payer: Self-pay

## 2020-03-22 DIAGNOSIS — B9561 Methicillin susceptible Staphylococcus aureus infection as the cause of diseases classified elsewhere: Secondary | ICD-10-CM

## 2020-03-22 DIAGNOSIS — R7881 Bacteremia: Secondary | ICD-10-CM

## 2020-03-22 DIAGNOSIS — R4182 Altered mental status, unspecified: Secondary | ICD-10-CM

## 2020-03-22 DIAGNOSIS — R569 Unspecified convulsions: Secondary | ICD-10-CM

## 2020-03-22 LAB — COMPREHENSIVE METABOLIC PANEL
ALT: 21 U/L (ref 0–44)
AST: 35 U/L (ref 15–41)
Albumin: 1.4 g/dL — ABNORMAL LOW (ref 3.5–5.0)
Alkaline Phosphatase: 73 U/L (ref 38–126)
Anion gap: 9 (ref 5–15)
BUN: 40 mg/dL — ABNORMAL HIGH (ref 6–20)
CO2: 19 mmol/L — ABNORMAL LOW (ref 22–32)
Calcium: 8.1 mg/dL — ABNORMAL LOW (ref 8.9–10.3)
Chloride: 97 mmol/L — ABNORMAL LOW (ref 98–111)
Creatinine, Ser: 1.02 mg/dL (ref 0.61–1.24)
GFR calc Af Amer: >60 mL/min (ref >60)
GFR calc non Af Amer: >60 mL/min (ref >60)
Glucose, Bld: 195 mg/dL — ABNORMAL HIGH (ref 70–99)
Potassium: 3.4 mmol/L — ABNORMAL LOW (ref 3.5–5.1)
Sodium: 125 mmol/L — ABNORMAL LOW (ref 135–145)
Total Bilirubin: 0.5 mg/dL (ref 0.3–1.2)
Total Protein: 5.6 g/dL — ABNORMAL LOW (ref 6.5–8.1)

## 2020-03-22 LAB — CBC WITH DIFFERENTIAL/PLATELET
Abs Immature Granulocytes: 0.04 10*3/uL (ref 0.00–0.07)
Abs Immature Granulocytes: 0.26 10*3/uL — ABNORMAL HIGH (ref 0.00–0.07)
Basophils Absolute: 0 10*3/uL (ref 0.0–0.1)
Basophils Absolute: 0 10*3/uL (ref 0.0–0.1)
Basophils Relative: 0 %
Basophils Relative: 0 %
Eosinophils Absolute: 0 10*3/uL (ref 0.0–0.5)
Eosinophils Absolute: 0 10*3/uL (ref 0.0–0.5)
Eosinophils Relative: 0 %
Eosinophils Relative: 0 %
HCT: 21 % — ABNORMAL LOW (ref 39.0–52.0)
HCT: 20.3 % — ABNORMAL LOW (ref 39.0–52.0)
Hemoglobin: 7.4 g/dL — ABNORMAL LOW (ref 13.0–17.0)
Hemoglobin: 7 g/dL — ABNORMAL LOW (ref 13.0–17.0)
Immature Granulocytes: 1 %
Immature Granulocytes: 7 %
Lymphocytes Relative: 3 %
Lymphocytes Relative: 3 %
Lymphs Abs: 0.1 10*3/uL — ABNORMAL LOW (ref 0.7–4.0)
Lymphs Abs: 0.1 10*3/uL — ABNORMAL LOW (ref 0.7–4.0)
MCH: 29.6 pg (ref 26.0–34.0)
MCH: 29 pg (ref 26.0–34.0)
MCHC: 35.2 g/dL (ref 30.0–36.0)
MCHC: 34.5 g/dL (ref 30.0–36.0)
MCV: 84 fL (ref 80.0–100.0)
MCV: 84.2 fL (ref 80.0–100.0)
Monocytes Absolute: 0.1 10*3/uL (ref 0.1–1.0)
Monocytes Absolute: 0.2 10*3/uL (ref 0.1–1.0)
Monocytes Relative: 4 %
Monocytes Relative: 6 %
Neutro Abs: 3.1 10*3/uL (ref 1.7–7.7)
Neutro Abs: 2.9 10*3/uL (ref 1.7–7.7)
Neutrophils Relative %: 92 %
Neutrophils Relative %: 84 %
Platelets: 40 10*3/uL — ABNORMAL LOW (ref 150–400)
Platelets: 38 10*3/uL — ABNORMAL LOW (ref 150–400)
RBC: 2.5 MIL/uL — ABNORMAL LOW (ref 4.22–5.81)
RBC: 2.41 MIL/uL — ABNORMAL LOW (ref 4.22–5.81)
RDW: 13.3 % (ref 11.5–15.5)
RDW: 13.7 % (ref 11.5–15.5)
WBC: 3.3 10*3/uL — ABNORMAL LOW (ref 4.0–10.5)
WBC: 3.5 10*3/uL — ABNORMAL LOW (ref 4.0–10.5)
nRBC: 0 % (ref 0.0–0.2)
nRBC: 0 % (ref 0.0–0.2)

## 2020-03-22 LAB — CULTURE, BLOOD (ROUTINE X 2): Special Requests: ADEQUATE

## 2020-03-22 LAB — GLUCOSE, CAPILLARY
Glucose-Capillary: 216 mg/dL — ABNORMAL HIGH (ref 70–99)
Glucose-Capillary: 250 mg/dL — ABNORMAL HIGH (ref 70–99)
Glucose-Capillary: 305 mg/dL — ABNORMAL HIGH (ref 70–99)

## 2020-03-22 LAB — PREPARE PLATELET PHERESIS: Unit division: 0

## 2020-03-22 LAB — LEGIONELLA PNEUMOPHILA SEROGP 1 UR AG: L. pneumophila Serogp 1 Ur Ag: NEGATIVE

## 2020-03-22 LAB — BLOOD GAS, ARTERIAL
Acid-base deficit: 3.9 mmol/L — ABNORMAL HIGH (ref 0.0–2.0)
Bicarbonate: 19.5 mmol/L — ABNORMAL LOW (ref 20.0–28.0)
Drawn by: 418751
FIO2: 32
O2 Saturation: 97.3 %
Patient temperature: 37
pCO2 arterial: 29 mmHg — ABNORMAL LOW (ref 32.0–48.0)
pH, Arterial: 7.442 (ref 7.350–7.450)
pO2, Arterial: 88.7 mmHg (ref 83.0–108.0)

## 2020-03-22 LAB — LACTIC ACID, PLASMA
Lactic Acid, Venous: 1.4 mmol/L (ref 0.5–1.9)
Lactic Acid, Venous: 1.2 mmol/L (ref 0.5–1.9)

## 2020-03-22 LAB — BPAM PLATELET PHERESIS
Blood Product Expiration Date: 202109152359
ISSUE DATE / TIME: 202109132045
Unit Type and Rh: 5100

## 2020-03-22 LAB — AMMONIA: Ammonia: 45 umol/L — ABNORMAL HIGH (ref 9–35)

## 2020-03-22 LAB — HAPTOGLOBIN: Haptoglobin: 398 mg/dL — ABNORMAL HIGH (ref 29–370)

## 2020-03-22 LAB — TROPONIN I (HIGH SENSITIVITY): Troponin I (High Sensitivity): 11 ng/L (ref <18)

## 2020-03-22 MED ORDER — NALOXONE HCL 0.4 MG/ML IJ SOLN
INTRAMUSCULAR | Status: AC
  Filled 2020-03-22: qty 1

## 2020-03-22 MED ORDER — CHLORHEXIDINE GLUCONATE CLOTH 2 % EX PADS
6.0000 | MEDICATED_PAD | Freq: Every day | CUTANEOUS | Status: DC
  Administered 2020-03-22 – 2020-03-27 (×6): 6 via TOPICAL

## 2020-03-22 MED ORDER — LEVETIRACETAM IN NACL 1000 MG/100ML IV SOLN
1000.0000 mg | Freq: Once | INTRAVENOUS | Status: AC
  Administered 2020-03-22: 1000 mg via INTRAVENOUS
  Filled 2020-03-22: qty 100

## 2020-03-22 MED ORDER — SODIUM CHLORIDE 0.9 % IV SOLN: INTRAVENOUS | Status: DC

## 2020-03-22 MED ORDER — ALBUTEROL SULFATE (2.5 MG/3ML) 0.083% IN NEBU
2.5000 mg | INHALATION_SOLUTION | Freq: Three times a day (TID) | RESPIRATORY_TRACT | Status: DC | PRN
  Filled 2020-03-22: qty 3

## 2020-03-22 MED ORDER — LORAZEPAM 2 MG/ML IJ SOLN
INTRAMUSCULAR | Status: AC
  Filled 2020-03-22: qty 1

## 2020-03-22 MED ORDER — LEVETIRACETAM 500 MG PO TABS
500.0000 mg | ORAL_TABLET | Freq: Two times a day (BID) | ORAL | Status: DC
  Administered 2020-03-22 – 2020-04-05 (×30): 500 mg via ORAL
  Filled 2020-03-22 (×33): qty 1

## 2020-03-22 MED ORDER — POTASSIUM CHLORIDE CRYS ER 20 MEQ PO TBCR
40.0000 meq | EXTENDED_RELEASE_TABLET | Freq: Once | ORAL | Status: AC
  Administered 2020-03-22: 40 meq via ORAL
  Filled 2020-03-22: qty 2

## 2020-03-22 MED ORDER — LORAZEPAM 2 MG/ML IJ SOLN: 2.0000 mg | Freq: Once | INTRAMUSCULAR | Status: DC

## 2020-03-22 MED ORDER — SODIUM CHLORIDE 0.9 % IV SOLN: INTRAVENOUS | Status: AC

## 2020-03-22 NOTE — Progress Notes (Signed)
[facility] Progress Note  Name: Trevor Shelton MRN: [id number] DOB: Jul 20, 1965  Subjective:  Patient was moved to the 4N ICU for AMS per staff report.  This AM he has been following commands.    Review of systems:    Shortness of breath - thinks is maybe a little better per pt Denies cp  Denies n/v  ---------- Background on consult:  HPI: The patient is a 54 y.o. year-old w/ hx of HTN, HL presented on 02/14/20 w/ back pain, L lower rib pain and constipation. Labs showed ^^Ca++, AKI, Na 125. CT showed diffuse lytic lesions c/w myeloma. Pt was admitted and was diagnosed w/ kappa LC multiple myeloma, admitted 8/08- 8/26.  He received XRT to spinal cord x 10 sessions. Renal saw him for AKI, creat admit was 4.9 and improved to 1.53, had 1-2 HD sessions. Had some low Na levels attributed to pseudohyponatremia from paraproteinemia +/- vol depletion or SIADH from some N/V he was having. He was dc'd and admitted to CIR on 8/26.  He had chemorx on 9/7 w/ next round scheduled for 9/14. He had nosebleed on 9/9 and got 2u prbc's.  On 9/11 pt spiked temp to 102 deg. Blood cx's from yesterday are growing Staph aureus, 4/4.  Labs yest showed Na 114, creat 1.0 and BUN 47, KUB ++ stool, UA neg.  UNa 11, Uosm 568.  Pt got IVF's at 50 cc/hr and Na improved to 121 this am.  Asked to see for hyponatremia.    Intake/Output Summary (Last 24 hours) at 03/22/2020 0857 Last data filed at 03/22/2020 0800 Gross per 24 hour  Intake 1224.73 ml  Output 750 ml  Net 474.73 ml    Vitals:  Vitals:   03/22/20 0600 03/22/20 0700 03/22/20 0800 03/22/20 0848  BP: 117/62 105/65 101/62   Pulse: 96 92 93   Resp: '19 17 16   ' Temp:   98.5 F (36.9 C)   TempSrc:   Oral   SpO2: 92% 93% 96% 99%  Weight:         Physical Exam:   General adult male in bed in NAD at rest  HEENT normocephalic atraumatic extraocular movements intact sclera anicteric Neck supple trachea midline Lungs clear anteriorly; short of breath  with exertion   Heart tachycardic; no rub  Abdomen soft nontender nondistended Extremities he has no edema  Neuro - alert and oriented to at least person and year.  Follows commands    Medications reviewed  Labs:  BMP Latest Ref Rng & Units 03/22/2020 03/21/2020 03/21/2020  Glucose 70 - 99 mg/dL 195(H) 202(H) 263(H)  BUN 6 - 20 mg/dL 40(H) 41(H) 44(H)  Creatinine 0.61 - 1.24 mg/dL 1.02 1.06 1.13  Sodium 135 - 145 mmol/L 125(L) 124(L) 122(L)  Potassium 3.5 - 5.1 mmol/L 3.4(L) 3.5 3.6  Chloride 98 - 111 mmol/L 97(L) 97(L) 96(L)  CO2 22 - 32 mmol/L 19(L) 18(L) 15(L)  Calcium 8.9 - 10.3 mg/dL 8.1(L) 8.0(L) 8.1(L)     Assessment/Plan:   1. Hyponatremia - in pt w/ new dx mult myeloma, sp 1st round chemorx on 9/07.  Tprot not very high, serum osm is low suggesting true hyponatremia.  felt volume depleted on consult.  Meds reviewed, seroquel has low chance of SIADH, others not implicated. Kidneys appear normal by CT. TSH low normal. Cortisol ok  1. Improving Na. hydration limited by resp status 2. NS at 39m/hr x 24 hours (ordered)  3. Stopped trazodone 2. Hx AKI due to myeloma -  resolved for the most part down to creat 1.3 (4.7 on admssion).  3. Multiple myeloma - sp chemoRx 1st round on 9/07 4. Fevers/ staph sepsis - +SOB, ?gallop on exam. Note TTE ordered to r/o valve insuff. Noted grade 1 diastolic dysfunction.  5. Dyspnea - CXR with question of ? Superimposed PNA.  Abx per primary team*   Claudia Desanctis, MD 03/22/2020  9:08 AM

## 2020-03-22 NOTE — Progress Notes (Signed)
Inpatient Diabetes Program Recommendations  AACE/ADA: New Consensus Statement on Inpatient Glycemic Control (2015)  Target Ranges:  Prepandial:   less than 140 mg/dL      Peak postprandial:   less than 180 mg/dL (1-2 hours)      Critically ill patients:  140 - 180 mg/dL   Results for TALI, COSTER (MRN [id number]) as of 03/22/2020 13:10  Ref. Range 03/22/2020 07:33 03/22/2020 12:31  Glucose-Capillary Latest Ref Range: 70 - 99 mg/dL 216 (H) 250 (H)     Current Orders: Lantus 15 units QHS      Novolog Sensitive Correction Scale/ SSI (0-9 units) TID AC       Novolog 3 units TID with meals     MD- Note patient getting Decadron 2 mg IV BID.  If CBGs remain elevated while on IV Steroids, please consider:  1. Increase Lantus slightly to 17 units QHS  2. Increase Novolog Meal Coverage to 5 units TID with meals    --Will follow patient during hospitalization--  Wyn Quaker RN, MSN, CDE Diabetes Coordinator Inpatient Glycemic Control Team Team Pager: [phone] (8a-5p)

## 2020-03-22 NOTE — Consult Note (Signed)
NAME:  Trevor Shelton, MRN:  [id number], DOB:  11-Oct-1965, LOS: 3 ADMISSION DATE:  03/22/2020, CONSULTATION DATE:  03/22/20 REFERRING MD:  Dr. Lupita Leash, CHIEF COMPLAINT:  Encephalopathy   Brief History   54 year old male admitted from [place] for fevers r/o sepsis. Incidentally found to have small ICH, which has been improving. Also found to have MSSA bacteremia. PCCM called early AM hours 9/14 for progressive encephalopathy and unresponsiveness.   History of present illness   54 year old male initially admitted  8/8 > 03/03/2020.  The CT-guided bone marrow biopsy on 02/19/20 showed positive for neoplasm kappa light chain consistent with multiple myeloma. Imaging showed multiple spinal lytic lesions, left rib fracture on ninth and 10th, cord compression. He finished 10 sessions of radiation therapy. He was discharged to [facility] 8/26 where he received his first dose of chemotherapy. On 9/11 he was re-admitted to [facility] with fevers and encephalopathy. CT showed small ICH. Blood cultures grew MSSA. Treated with cefazolin. He had been improving and ICH had been resolving on imaging. In the late PM hours of 9/14 he became lethargic and unresponsive. CT head showed improvement of bleed. He did not respond to narcan and labs were largely within normal limits. PCCM was consulted.   Past Medical History   has a past medical history of AKI (acute kidney injury) ([place]) (02/2020), Hypercalcemia (02/2020), Hypercholesteremia, and Hypertension.   Significant Hospital Events     Consults:  Neurology Nephrology ID PCCM  Procedures:    Significant Diagnostic Tests:  CT chest abd pelv 9/12 > Mild to moderate severity bilateral areas of atelectasis and/or Infiltrate. 5.2 cm x 1.5 cm pleural based soft tissue mass along the anterior aspect of the left upper lobe. A 1.4 cm pleural based soft tissue nodule is seen within this region on the prior study. Findings consistent with multiple myeloma versus diffuse osseous  metastasis, with chronic pathologic fractures at the levels of T6 and T9 vertebral bodies. Markedly enlarged prostate gland. CT head 9/12 > Small lobulated hyperdensity immediately subjacent to the right frontal calvarium as above. Highly suspicious for a small bleed. No significant mass effect or edema. Multiple scattered lucent lesions throughout the calvarium and visualized upper cervical spine, consistent with history of multiple myeloma. MRI Brain 9/12 > Acute intracranial hemorrhage involving the anterior-inferior right frontal convexity, stable from prior head CT. No associated edema or significant mass effect. No other acute intracranial abnormality CT head 9/12 (later) > Interval decrease in size and conspicuity of a known focus of intracranial hemorrhage along the anteroinferior right frontal lobe convexity (suspected both intraparenchymal and extra-axial). TTE 9/13 >  Micro Data:  9/12 RVP neg   Antimicrobials:  Cefazolin  Interim history/subjective:    Objective   Blood pressure 126/77, pulse (!) 104, temperature 99.4 F (37.4 C), temperature source Oral, resp. rate (!) 29, SpO2 95 %.        Intake/Output Summary (Last 24 hours) at 03/22/2020 0047 Last data filed at 03/22/2020 0000 Gross per 24 hour  Intake 1144.83 ml  Output 1400 ml  Net -255.17 ml   There were no vitals filed for this visit.  Examination: General: Middle aged male  HENT: Pupils 60m and sluggish with upward gaze preference.  Lungs: Clear Cardiovascular: RRR, no MRG Abdomen: Soft, non-tender, non-distedned Extremities: no acute deformity Neuro: Obtunded. No response to pain. Upward gaze  Resolved Hospital Problem list   Hyponatremia  Assessment & Plan:   Acute encephalopathy: etiology unclear. There is concern  for seizures given known metastatic MM and ICH as well as hyponatremia. He is spontaneously improved and answering questions upon arrival to ICU.  - transfer to ICU - Keppra -  Monitor - Neurology following.  - PRN ativan - Repeat BMP, Ammonia  MSSA bacteremia HCAP presumably MSSA as well.  - ID following - continue ancef - Repeat cultures pending. - TTE with no evidence of vegetations.   Multiple myeloma: with multiply lysions at T6, T9 with near cord compression. S/p radiation and chemo.  - Oncology following - Transfuse plt to keep above 20.   Anemia secondary to MM and recent epistaxis requiring transfusion - trend CBC  Pleural based lung mass - oncology following to decide what workup is needed.   Hyponatremia - nephrology following - Correcting very slowly with now possible seizure. Not over 125 yet. Will increase NS infusion rate to 3m per hour. Follow BMP   DM - SSI  Metabolic acidosis non gap, slowly improving.  - check lactic  CP - EKG, Troponin  Best practice:  Diet: NPO Pain/Anxiety/Delirium protocol (if indicated): NA VAP protocol (if indicated): NA DVT prophylaxis: Na GI prophylaxis: PPI Glucose control: SSI Mobility: Br Code Status: FULL Family Communication: Patient updated Disposition: ICU  Labs   CBC: Recent Labs  Lab 03/17/20 1000 03/17/20 1000 03/18/20 0930 03/18/20 0930 03/10/2020 0517 04/04/2020 1802 03/20/20 0040 03/20/20 1134 03/21/20 1359  WBC 3.5*   < > 2.7*  --  2.9* 4.6 4.4  --  2.5*  NEUTROABS 2.6  --  2.1  --  2.1  --  3.5  --  2.2  HGB 7.7*   < > 6.8*  --  11.1* 10.0* 9.4*  --  7.4*  HCT 22.4*   < > 19.7*  --  31.5* 28.8* 26.1*  --  21.2*  MCV 86.5   < > 83.5  --  84.7 83.0 83.1  --  83.1  PLT 75*   < > 49*   < > 47* 46* 38* 28* 25*   < > = values in this interval not displayed.    Basic Metabolic Panel: Recent Labs  Lab 03/20/20 2344 03/21/20 0240 03/21/20 1359 03/21/20 1922 03/21/20 2259  NA 122* 123* 123* 122* 124*  K 3.8 3.6 3.6 3.6 3.5  CL 96* 96* 96* 96* 97*  CO2 15* 18* 16* 15* 18*  GLUCOSE 181* 153* 219* 263* 202*  BUN 47* 46* 40* 44* 41*  CREATININE 1.39* 1.31* 1.05 1.13  1.06  CALCIUM 7.9* 8.1* 8.0* 8.1* 8.0*   GFR: Estimated Creatinine Clearance: 62.6 mL/min (by C-G formula based on SCr of 1.06 mg/dL). Recent Labs  Lab 03/17/2020 0517 04/06/2020 1802 03/20/20 0040 03/21/20 1359  WBC 2.9* 4.6 4.4 2.5*  LATICACIDVEN  --   --  1.4  --     Liver Function Tests: Recent Labs  Lab 04/05/2020 1802  AST 38  ALT 30  ALKPHOS 94  BILITOT 0.6  PROT 6.3*  ALBUMIN 1.9*   No results for input(s): LIPASE, AMYLASE in the last 168 hours. No results for input(s): AMMONIA in the last 168 hours.  ABG    Component Value Date/Time   PHART 7.442 03/22/2020 0000   PCO2ART 29.0 (L) 03/22/2020 0000   PO2ART 88.7 03/22/2020 0000   HCO3 19.5 (L) 03/22/2020 0000   ACIDBASEDEF 3.9 (H) 03/22/2020 0000   O2SAT 97.3 03/22/2020 0000     Coagulation Profile: Recent Labs  Lab 03/20/20 0201 03/20/20 1134  INR 1.4*  1.5*    Cardiac Enzymes: No results for input(s): CKTOTAL, CKMB, CKMBINDEX, TROPONINI in the last 168 hours.  HbA1C: Hgb A1c MFr Bld  Date/Time Value Ref Range Status  02/18/2020 02:50 AM 6.5 (H) 4.8 - 5.6 % Final    Comment:    (NOTE) Pre diabetes:          5.7%-6.4%  Diabetes:              >6.4%  Glycemic control for   <7.0% adults with diabetes     CBG: Recent Labs  Lab 03/21/20 0734 03/21/20 1236 03/21/20 1730 03/21/20 2158 03/21/20 2323  GLUCAP 144* 156* 231* 197* 195*    Review of Systems:   Patient is encephalopathic and/or intubated. Therefore history has been obtained from chart review.   Past Medical History  He,  has a past medical history of AKI (acute kidney injury) ([place]) (02/2020), Hypercalcemia (02/2020), Hypercholesteremia, and Hypertension.   Surgical History    Past Surgical History:  Procedure Laterality Date  . HERNIA REPAIR    . IR FLUORO GUIDE CV LINE RIGHT  02/17/2020  . IR REMOVAL TUN CV CATH W/O FL  03/01/2020  . IR US GUIDE VASC ACCESS RIGHT  02/17/2020     Social History   reports that he has never  smoked. He has never used smokeless tobacco. He reports previous alcohol use. He reports previous drug use.   Family History   His family history includes Alzheimer's disease in his mother; Diabetes in his brother and father.   Allergies Allergies  Allergen Reactions  . Other     pollen  . Penicillins     unknown     Home Medications  Prior to Admission medications   Medication Sig Start Date End Date Taking? Authorizing Provider  loratadine (CLARITIN) 10 MG tablet Take 10 mg by mouth daily.   Yes [provider]  meloxicam (MOBIC) 15 MG tablet Take 15 mg by mouth daily as needed for pain.   Yes [provider]  simvastatin (ZOCOR) 20 MG tablet Take 20 mg by mouth daily. 02/01/20  Yes [provider]  terazosin (HYTRIN) 1 MG capsule Take 1 mg by mouth at bedtime.   Yes [provider]  amLODipine (NORVASC) 10 MG tablet Take 1 tablet (10 mg total) by mouth daily. Patient not taking: Reported on 03/21/2020 03/04/20   Shelly Coss, MD  carvedilol (COREG) 3.125 MG tablet Take 1 tablet (3.125 mg total) by mouth 2 (two) times daily with a meal. Patient not taking: Reported on 03/21/2020 03/03/20   Shelly Coss, MD  dexamethasone (DECADRON) 4 MG tablet Take 1 tablet (4 mg total) by mouth 3 (three) times daily. Patient not taking: Reported on 03/21/2020 03/03/20 03/03/21  Shelly Coss, MD  feeding supplement, ENSURE ENLIVE, (ENSURE ENLIVE) LIQD Take 237 mLs by mouth 3 (three) times daily between meals. Patient not taking: Reported on 03/21/2020 03/03/20   Shelly Coss, MD  insulin aspart (NOVOLOG) 100 UNIT/ML injection Inject 0-9 Units into the skin 3 (three) times daily with meals. Patient not taking: Reported on 03/21/2020 03/03/20   Shelly Coss, MD  insulin aspart (NOVOLOG) 100 UNIT/ML injection Inject 3 Units into the skin 3 (three) times daily with meals. Patient not taking: Reported on 03/21/2020 03/03/20   Shelly Coss, MD  insulin glargine  (LANTUS) 100 UNIT/ML injection Inject 0.1 mLs (10 Units total) into the skin at bedtime. Patient not taking: Reported on 03/21/2020 03/03/20   Shelly Coss, MD  lidocaine (LIDODERM) 5 % Place 1 patch onto the skin daily. Remove & Discard patch within 12 hours or as directed by MD Patient not taking: Reported on 03/21/2020 03/03/20   Shelly Coss, MD  lidocaine (XYLOCAINE) 2 % solution Use as directed 15 mLs in the mouth or throat every 6 (six) hours as needed for mouth pain. Patient not taking: Reported on 03/21/2020 03/03/20   Shelly Coss, MD  oxyCODONE (OXY IR/ROXICODONE) 5 MG immediate release tablet Take 1 tablet (5 mg total) by mouth every 4 (four) hours as needed for moderate pain. Patient not taking: Reported on 03/21/2020 03/03/20   Shelly Coss, MD  pantoprazole (PROTONIX) 40 MG tablet Take 1 tablet (40 mg total) by mouth daily. Patient not taking: Reported on 03/21/2020 03/04/20   Shelly Coss, MD  polyethylene glycol (MIRALAX / GLYCOLAX) 17 g packet Take 17 g by mouth daily as needed for moderate constipation. Patient not taking: Reported on 03/21/2020 03/03/20   Shelly Coss, MD     Critical care time: 40 mins     Georgann Housekeeper, AGACNP-BC [place] for personal pager PCCM on call pager [phone]  03/22/2020 1:51 AM

## 2020-03-22 NOTE — Significant Event (Addendum)
Rapid Response Event Note   Reason for Call :  Called d/t decreased LOC. Pt was alert and talking with interpreter at 2000 assessment. T/o the night, his responsiveness has declined. Now, per RN, pt is alert x 1 and not breathing well.   RN called MD and STAT head CT was ordered.   Initial Focused Assessment:  Pt laying in bed with eyes closed. Pt is unresponsive to painful stimuli. Pt will spontaneously open his eyes but will not do anything purposeful. Pt does have a gag reflex. Pupils 5, equal, and reactive. Eyes with upward gaze. Lungs with rhonchi t/o, L>R.  Skin warm and dry. T-99.4, HR-104, BP-126/77, RR-23.  On the way to CT scan, pt did open his eyes and started moving his arms but would not answer questions or follow commands.  After scan was done, pt was unresponsive again.   Once back in [place], pt had episode of shaking that was witnessed by [facility] RNs.  Dr. Cyd Silence came to bedside to assess pt. Narcan, labs, PCXR, and EKG were ordered.  PCCM consulted and came to bedside-orders to transfer to ICU.   Interventions:  CBG-195 CT head STAT-1. Trace extra-axial hemorrhage beneath the right frontal lobe remains faintly visible. No new site of hemorrhage. 2. Numerous lucent lesions throughout the calvarium and skull base, consistent with known multiple myeloma. Narcan 0.50m x 1-no change in MS ABG-7.44/29/88.7/19.5 PCXR-Patchy left greater than right airspace disease concerning for pneumonia. Findings appear slightly worsened. EKG-ST CBC/CMP/LA/Trop  PCCM consulted: Pt tx to ICU  Plan of Care:  Tx to 4N15.   Event Summary:   MD Notified: Dr. SCyd Silencenotified and came to bedside.  Call Time: [place] Gayle Martinez Anderson, RN

## 2020-03-22 NOTE — Progress Notes (Signed)
Pharmacy Electrolyte Replacement  Recent Labs:  Recent Labs    03/22/20 0101  K 3.4*  CREATININE 1.02    Low Critical Values (K </= 2.5, Phos </= 1, Mg </= 1) Present: None  MD Contacted: n/a - no critical values noted  Plan: Give KCl 61meq PO x 1 dose and recheck K in AM per protocol   Arturo Morton, PharmD, BCPS Please check AMION for all [facility] contact numbers Clinical Pharmacist 03/22/2020 3:38 PM

## 2020-03-22 NOTE — Procedures (Addendum)
Patient Name: Justen Fonda  MRN: [id number]  Epilepsy Attending: Lora Havens  Referring Physician/Provider: Dr Karena Addison Aroor Date: 03/22/2020 Duration: 23.25 mins  Patient history: 54yo m with AMS. CTH showed right frontal mass concerning for mets. EEG to evaluate for seizure.   Level of alertness: awake  AEDs during EEG study: LEV  Technical aspects: This EEG study was done with scalp electrodes positioned according to the 10-20 International system of electrode placement. Electrical activity was acquired at a sampling rate of 500Hz  and reviewed with a high frequency filter of 70Hz  and a low frequency filter of 1Hz . EEG data were recorded continuously and digitally stored.   Description: No posterior dominant rhythm was seen. EEG showed continuous generalized 3 to 6 Hz theta-delta slowing. Hyperventilation and photic stimulation were not performed.     ABNORMALITY -Continuous slow, generalized  IMPRESSION: This study is suggestive of moderate diffuse encephalopathy, nonspecific etiology. No seizures or epileptiform discharges were seen throughout the recording.  Elanah Osmanovic Barbra Sarks

## 2020-03-22 NOTE — Progress Notes (Signed)
STROKE TEAM PROGRESS NOTE      INTERVAL HISTORY .  Patient had decrease in mental status yesterday evening and neuro hospitalist on call was consulted.  Stat CT scan of the head was obtained which showed no new hemorrhage and decrease in prior hemorrhage.  Patient subsequently had a witnessed seizure and was loaded with 1 g of Keppra followed by 500 twice daily.  This morning is sitting up in bed.  His wife is at the bedside.  He seems to have recovered back to his baseline as per his wife.  I used Spanish language video interpreter for this visit.  EEG has been done results are pending at this time.  Infectious disease is following him and recommend cefazolin for his MSSA bacteremia and cardiology consult for TEE and repeat blood cultures.  His neurological exam is unchanged from yesterday  OBJECTIVE Vitals:   03/22/20 1000 03/22/20 1100 03/22/20 1200 03/22/20 1300  BP: 119/72 106/62 119/68 102/68  Pulse: (!) 104 96 (!) 103 99  Resp: 18 17 (!) 27 (!) 21  Temp:   98.6 F (37 C)   TempSrc:   Axillary   SpO2: 96% 97% 97% 97%  Weight:        CBC:  Recent Labs  Lab 03/21/20 1359 03/22/20 0101  WBC 2.5* 3.3*  NEUTROABS 2.2 3.1  HGB 7.4* 7.4*  HCT 21.2* 21.0*  MCV 83.1 84.0  PLT 25* 40*    Basic Metabolic Panel:  Recent Labs  Lab 03/21/20 2259 03/22/20 0101  NA 124* 125*  K 3.5 3.4*  CL 97* 97*  CO2 18* 19*  GLUCOSE 202* 195*  BUN 41* 40*  CREATININE 1.06 1.02  CALCIUM 8.0* 8.1*    Lipid Panel:     Component Value Date/Time   CHOL 175 02/25/2020 0340   TRIG 142 02/25/2020 0340   HDL 38 (L) 02/25/2020 0340   CHOLHDL 4.6 02/25/2020 0340   VLDL 28 02/25/2020 0340   LDLCALC 109 (H) 02/25/2020 0340   HgbA1c:  Lab Results  Component Value Date   HGBA1C 6.5 (H) 02/18/2020   Urine Drug Screen: No results found for: LABOPIA, COCAINSCRNUR, LABBENZ, AMPHETMU, THCU, LABBARB  Alcohol Level No results found for: [facility]  IMAGING  DG Chest 2 View 03/11/2020 IMPRESSION:   Areas of patchy atelectasis bilaterally. No edema or airspace opacity. Cardiac silhouette within normal limits.   CT HEAD WO CONTRAST 03/20/2020 IMPRESSION:  1. Small lobulated hyperdensity immediately subjacent to the right frontal calvarium as above. Highly suspicious for a small bleed. No significant mass effect or edema.  2. Multiple scattered lucent lesions throughout the calvarium and visualized upper cervical spine, consistent with history of multiple myeloma.  3. No other acute intracranial abnormality.   Repeat CT HEAD WO CONTRAST 03/21/2020-trace extra-axial hemorrhage in the right frontal lobe no new site of hemorrhage.   MR BRAIN W WO CONTRAST 03/20/2020 IMPRESSION:  1. Acute intracranial hemorrhage involving the anterior-inferior right frontal convexity, stable from prior head CT. No associated edema or significant mass effect.  2. No other acute intracranial abnormality.  3. Innumerable marrow replacing lesions throughout the calvarium, skull base, and visualized upper spine, compatible with history of multiple myeloma.   CT CHEST ABDOMEN PELVIS W CONTRAST 03/20/2020 IMPRESSION:  1. Mild to moderate severity bilateral areas of atelectasis and/or infiltrate.  2. 5.2 cm x 1.5 cm pleural based soft tissue mass along the anterior aspect of the left upper lobe. A 1.4 cm pleural based soft tissue nodule  is seen within this region on the prior study. These findings are concerning for the presence of a primary lung malignancy. Further evaluation with a nuclear medicine PET/CT is recommended.  3. Findings consistent with multiple myeloma versus diffuse osseous metastasis, with chronic pathologic fractures at the levels of T6 and T9 vertebral bodies.  4. Markedly enlarged prostate gland.  5. 4.0 cm x 3.2 cm fat-containing right scrotal hernia.  6. Mild cardiomegaly.  7. Small hiatal hernia.  8. Aortic atherosclerosis.  9. Small simple cyst within the left kidney.  Aortic  Atherosclerosis (ICD10-I70.0).   DG Abd Portable 1V 03/30/2020 IMPRESSION:  1. Nonspecific bowel gas pattern with gaseous distention of loops of small bowel and colon scattered throughout the abdomen.  2. Above average amount of stool throughout the ascending colon.   Bilateral Lower Extremity Venous Dopplers - 03/03/20 - negative for DVT  ECG - ST rate 126 BPM. (See cardiology reading for complete details)  PHYSICAL EXAM Blood pressure 102/68, pulse 99, temperature 98.6 F (37 C), temperature source Axillary, resp. rate (!) 21, weight 57.2 kg, SpO2 97 %. Mildly obese middle-aged Hispanic male not in distress. . Afebrile. Head is nontraumatic. Neck is supple without bruit.    Cardiac exam no murmur or gallop. Lungs are clear to auscultation. Distal pulses are well felt.  There is mild healed blood over both upper and lower lips. Right shoulder elevation limited due to pain Neurological Exam: Exam is limited due to necessity to use televideo interpreter.  He is awake alert seems oriented to time place and person.  Speech appears clear without dysarthria.  Extraocular movements are full range without nystagmus.  He follows simple midline and one-step commands well.  No facial weakness.  Tongue midline. Motor system exam able to move all 4 extremities equally well against gravity without focal weakness.  Shoulder elevation limited due to pain on the right.  Sensation intact bilaterally.  Reflexes symmetric.  Plantars downgoing.  Gait not tested. ASSESSMENT/PLAN [name] is a 54 y.o. male with history of hypertension, AKI, DM, hypercalcemia, and hyperlipidemia recently diagnosed with metastatic multiple myeloma complicated by lytic lesions of the spine, as well as other painful lesions, treated with radiation of spine lesions due to concern for impending cord compression and CyBorD chemotherapy with prophylactic acyclovir, new possible primary lung malignancy, recent epistaxis requiring  transfusion for pancytopenia, a head CT performed for AMS demonstrated a lesion in the right frontal lobe concerning for possible hemorrhage prompting a neurology consult. Recent symptoms include blurred vision, lower extremity weakness, incontinence of urine more than bowel, significant shoulder, back, and leg pain, as well as decreased sensation in the sacral/genital region. He did not receive IV t-PA due to hemorrhage.   Stroke: Acute intracranial hemorrhage involving the anterior-inferior right frontal convexity-likely due to thrombocytopenia from effect of multiple myeloma and chemotherapy for its treatment.-Doubt this represents hypertensive, hemorrhagic infarct or septic emboli  Resultant no focal neuro deficits  Code Stroke CT Head - not ordered  CT head - Small lobulated hyperdensity immediately subjacent to the right frontal calvarium as above. Highly suspicious for a small bleed.  CT Head WO -decreased size of right frontal anteroinferior convexity hemorrhage.    MRI head - Acute intracranial hemorrhage involving the anterior-inferior right frontal convexity, stable from prior head CT.   MRA head - not ordered  CTA H&N - not ordered  CT Perfusion - not ordered  CT Chest Abd and Pelvus - 5.2 cm x 1.5 cm pleural  based soft tissue mass along the anterior aspect of the left upper lobe. A 1.4 cm pleural based soft tissue nodule is seen within this region on the prior study. These findings are concerning for the presence of a primary lung malignancy.  Carotid Doppler - not indicated  2D Echo - not indicated  [employer] Virus 2 - negative  LDL - 109  HgbA1c - 6.5  UDS - not ordered  VTE prophylaxis - SCDs Diet  Diet Order            Diet Carb Modified Fluid consistency: Thin; Room service appropriate? Yes  Diet effective now                 No antithrombotic prior to admission, now on No antithrombotic due to thrombocytopenia  Ongoing aggressive stroke risk factor  management  Therapy recommendations:  pending  Disposition:  Pending  Hypertension  Home BP meds: Norvasc ; Coreg  Current BP meds: Coreg  Stable  SBP goal < 140 mm Hg initially . Long-term BP goal normotensive  Hyperlipidemia  Home Lipid lowering medication: Zocor 20 mg daily  LDL 109, goal < 70  Current lipid lowering medication: Lipitor 20 mg daily -> will D/C - contraindicated in [place]  Continue statin at discharge  Diabetes (Decadron)  Home diabetic meds: insulin  Current diabetic meds: insulin  HgbA1c 6.5, goal < 7.0 Recent Labs    03/21/20 2323 03/22/20 0733 03/22/20 1231  GLUCAP 195* 216* 250*    Other Stroke Risk Factors  Previous ETOH use  Previous drug use  Other Active Problems  Code status - Full code  Aortic Atherosclerosis (ICD10-I70.0)   AKI - creatinine - 127->124  Anemia - Hgb - 9.4 Thrombocytopenia - platelets - 38 Hyponatremia - Na - 121 Hypocalcemia - 7.9 Tachycardia - 115 - 143 - likely due to fever Fever - 102.3 - Maxipime ; Vancomycin ; Diflucan Multiple Myeloma Possible primary lung malignancy NPO - tube feedings  Hospital day # 3 Presented with confusion disorientation without any other focal deficits and CT scan and subsequently MRI confirmed small 1 cm right frontal parenchymal hemorrhage with slight subdural extension.  Etiology is likely related to severe thrombocytopenia which is a result of multiple myeloma and its resultant chemotherapy side effect or possibly septic emboli from endocarditis given new finding of MSSA bacteremia..  . Patient may benefit with TEE to look for endocarditis and cardiology has been consulted.  Continue Keppra 500 twice daily for seizure prophylaxis and follow EEG results.  Long discussion with Dr. Maren Beach as well as with patient and his wife using Spanish language interpreter and answered questions.     Greater than 50% time during this 35-minute visit was spent on counseling and coordination  of care about his intracerebral hemorrhage and thrombocytopenia and answering questions.  Long discussion with patient and wife via [place] language video interpreter and patient's RN and answered questions.   Antony Contras, MD To contact Stroke Continuity provider, please refer to [email or web address] After hours, contact General Neurology  Addendum :  Repeat CT scan of the head shows decrease in size of the right frontal superficial cortical hemorrhage without significant mass-effect or worsening. I do not believe that the transfusion is indicated at the present time from neurological standpoint.  If hematologist believes it is necessary he may give it. Antony Contras, MD Medical Director [facility] Pager: [phone] 03/22/2020 1:20 PM

## 2020-03-22 NOTE — Progress Notes (Addendum)
NAME:  Trevor Shelton, MRN:  [id number], DOB:  Jun 25, 1966, LOS: 3 ADMISSION DATE:  03/21/2020, CONSULTATION DATE:  03/22/20 REFERRING MD:  Dr. Lupita Leash, CHIEF COMPLAINT:  Encephalopathy   Brief History   54 year old male admitted from [place] for fevers r/o sepsis. Incidentally found to have small ICH, which has been improving. Also found to have MSSA bacteremia. PCCM called early AM hours 9/14 for progressive encephalopathy and unresponsiveness.   History of present illness   54 year old male initially admitted  8/8 > 03/03/2020.  The CT-guided bone marrow biopsy on 02/19/20 showed positive for neoplasm kappa light chain consistent with multiple myeloma. Imaging showed multiple spinal lytic lesions, left rib fracture on ninth and 10th, cord compression. He finished 10 sessions of radiation therapy. He was discharged to [facility] 8/26 where he received his first dose of chemotherapy. On 9/11 he was re-admitted to [facility] with fevers and encephalopathy. CT showed small ICH. Blood cultures grew MSSA. Treated with cefazolin. He had been improving and ICH had been resolving on imaging. In the late PM hours of 9/14 he became lethargic and unresponsive. CT head showed improvement of bleed. He did not respond to narcan and labs were largely within normal limits. PCCM was consulted.   Past Medical History   has a past medical history of AKI (acute kidney injury) ([place]) (02/2020), Hypercalcemia (02/2020), Hypercholesteremia, and Hypertension.   Significant Hospital Events     Consults:  Neurology Nephrology ID PCCM  Procedures:    Significant Diagnostic Tests:  CT chest abd pelv 9/12 > Mild to moderate severity bilateral areas of atelectasis and/or Infiltrate. 5.2 cm x 1.5 cm pleural based soft tissue mass along the anterior aspect of the left upper lobe. A 1.4 cm pleural based soft tissue nodule is seen within this region on the prior study. Findings consistent with multiple myeloma versus diffuse osseous  metastasis, with chronic pathologic fractures at the levels of T6 and T9 vertebral bodies. Markedly enlarged prostate gland. CT head 9/12 > Small lobulated hyperdensity immediately subjacent to the right frontal calvarium as above. Highly suspicious for a small bleed. No significant mass effect or edema. Multiple scattered lucent lesions throughout the calvarium and visualized upper cervical spine, consistent with history of multiple myeloma. MRI Brain 9/12 > Acute intracranial hemorrhage involving the anterior-inferior right frontal convexity, stable from prior head CT. No associated edema or significant mass effect. No other acute intracranial abnormality CT head 9/12 (later) > Interval decrease in size and conspicuity of a known focus of intracranial hemorrhage along the anteroinferior right frontal lobe convexity (suspected both intraparenchymal and extra-axial). TTE 9/13 >  Micro Data:  9/12 RVP neg   Antimicrobials:  Cefazolin  Interim history/subjective:  Currently awake follows commands. Objective   Blood pressure 112/75, pulse (!) 101, temperature 98.5 F (36.9 C), temperature source Oral, resp. rate 17, weight 57.2 kg, SpO2 96 %.        Intake/Output Summary (Last 24 hours) at 03/22/2020 0933 Last data filed at 03/22/2020 0800 Gross per 24 hour  Intake 1224.73 ml  Output 750 ml  Net 474.73 ml   Filed Weights   03/22/20 0500  Weight: 57.2 kg    Examination: General: Ill-appearing male in no acute distress HEENT: No JVD or lymphadenopathy is appreciated Neuro: Follows commands although there is a language barrier.  Questionable right arm tremor CV: Heart sounds are regular regular rhythm  PULM: Diminished in the bases GI: soft, bsx4 active   Extremities: warm/dry, 1+  edema  Skin: no rashes or lesions   Resolved Hospital Problem list     Assessment & Plan:   Acute encephalopathy: etiology unclear. There is concern for seizures given known metastatic MM and ICH  as well as hyponatremia. He is spontaneously improved and answering questions upon arrival to ICU.   Mental status is improved.  We will transfer back to the progressive care Neurology seen agree with [place] Consider transfer back to progressive care or he can remain in ICU for total 24 hours Continue to follow ammonia level As needed Ativan for active seizing    MSSA bacteremia HCAP presumably MSSA as well.   Per ID Continue Ancef Follow culture data No evidence of vegetation per TTE    Multiple myeloma: with multiply lysions at T6, T9 with near cord compression. S/p radiation and chemo.  Per oncology Transfuse platelets to keep greater than 20 currently platelets are 40 On decadron per hemonc  Anemia secondary to MM and recent epistaxis requiring transfusion Recent Labs    03/21/20 1359 03/22/20 0101  HGB 7.4* 7.4*    Monitor and transfuse per protocol  Pleural based lung mass Per oncology  Hyponatremia Recent Labs  Lab 03/21/20 1922 03/21/20 2259 03/22/20 0101  NA 122* 124* 125*   Per nephrology Sodium is slowly improving   DM CBG (last 3)  Recent Labs    03/21/20 2158 03/21/20 2323 03/22/20 0733  GLUCAP 197* 195* 216*  on steroids per hemonc  Sliding-scale insulin protocol  Metabolic acidosis non gap, slowly improving.  Lactic acid 1.2 therefore resolved  CP Monitor troponins  Best practice:  Diet: NPO Pain/Anxiety/Delirium protocol (if indicated): NA VAP protocol (if indicated): NA DVT prophylaxis: Na GI prophylaxis: PPI Glucose control: SSI Mobility: Br Code Status: FULL Family Communication: Patient updated Disposition: ICU  Labs   CBC: Recent Labs  Lab 03/18/20 0930 03/18/20 0930 03/28/2020 0517 04/06/2020 0517 03/16/2020 1802 03/20/20 0040 03/20/20 1134 03/21/20 1359 03/22/20 0101  WBC 2.7*   < > 2.9*  --  4.6 4.4  --  2.5* 3.3*  NEUTROABS 2.1  --  2.1  --   --  3.5  --  2.2 3.1  HGB 6.8*   < > 11.1*  --  10.0* 9.4*  --   7.4* 7.4*  HCT 19.7*   < > 31.5*  --  28.8* 26.1*  --  21.2* 21.0*  MCV 83.5   < > 84.7  --  83.0 83.1  --  83.1 84.0  PLT 49*   < > 47*   < > 46* 38* 28* 25* 40*   < > = values in this interval not displayed.    Basic Metabolic Panel: Recent Labs  Lab 03/21/20 0240 03/21/20 1359 03/21/20 1922 03/21/20 2259 03/22/20 0101  NA 123* 123* 122* 124* 125*  K 3.6 3.6 3.6 3.5 3.4*  CL 96* 96* 96* 97* 97*  CO2 18* 16* 15* 18* 19*  GLUCOSE 153* 219* 263* 202* 195*  BUN 46* 40* 44* 41* 40*  CREATININE 1.31* 1.05 1.13 1.06 1.02  CALCIUM 8.1* 8.0* 8.1* 8.0* 8.1*   GFR: Estimated Creatinine Clearance: 65.1 mL/min (by C-G formula based on SCr of 1.02 mg/dL). Recent Labs  Lab 03/22/2020 1802 03/20/20 0040 03/21/20 1359 03/22/20 0101 03/22/20 0423  WBC 4.6 4.4 2.5* 3.3*  --   LATICACIDVEN  --  1.4  --  1.4 1.2    Liver Function Tests: Recent Labs  Lab 03/18/2020 1802 03/22/20 0101  AST  38 35  ALT 30 21  ALKPHOS 94 73  BILITOT 0.6 0.5  PROT 6.3* 5.6*  ALBUMIN 1.9* 1.4*   No results for input(s): LIPASE, AMYLASE in the last 168 hours. Recent Labs  Lab 03/22/20 0423  AMMONIA 45*    ABG    Component Value Date/Time   PHART 7.442 03/22/2020 0000   PCO2ART 29.0 (L) 03/22/2020 0000   PO2ART 88.7 03/22/2020 0000   HCO3 19.5 (L) 03/22/2020 0000   ACIDBASEDEF 3.9 (H) 03/22/2020 0000   O2SAT 97.3 03/22/2020 0000     Coagulation Profile: Recent Labs  Lab 03/20/20 0201 03/20/20 1134  INR 1.4* 1.5*    Cardiac Enzymes: No results for input(s): CKTOTAL, CKMB, CKMBINDEX, TROPONINI in the last 168 hours.  HbA1C: Hgb A1c MFr Bld  Date/Time Value Ref Range Status  02/18/2020 02:50 AM 6.5 (H) 4.8 - 5.6 % Final    Comment:    (NOTE) Pre diabetes:          5.7%-6.4%  Diabetes:              >6.4%  Glycemic control for   <7.0% adults with diabetes     CBG: Recent Labs  Lab 03/21/20 1236 03/21/20 1730 03/21/20 2158 03/21/20 2323 03/22/20 0733  GLUCAP 156* 231*  197* 195* 216*     Critical care time: 30 mins     Steve Shaunda Tipping ACNP Acute Care Nurse Practitioner [place] Please consult Amion 03/22/2020, 9:33 AM

## 2020-03-22 NOTE — Care Management (Signed)
Consult for medication assistance. Changed pharmacy to [facility] , will assist with [facility] program, and CHW appointment closer to discharge. CIR also following.  Magdalen Spatz RN

## 2020-03-22 NOTE — Progress Notes (Signed)
Overnight events noted Patient had decreased responsiveness and also difficulty breathing,oaded with Keppra for seizure, rapid response called, transferred to 4N under  ICU, l He has been more alert awake in ICU but still drowsy. Seen in ICU, and I discussed with the critical care team Cont care per PCCM in ICU and TRH will assume care once transferred out of [place] tomorrow.

## 2020-03-22 NOTE — Progress Notes (Signed)
[facility] for Infectious Disease  Date of Admission:  03/11/2020      Total days of antibiotics 3  Day 2 Cefazolin           ASSESSMENT: Trevor Shelton appears much improved compared to yesterday. His chest pain is improved and now off oxygen therapy. Repeat blood cultures from 9/13 preliminarily negative. While I suspect his MSSA bacteremia may be secondary to pneumonia, he should have a TEE to help with duration of therapy for antibiotics. I think once out of ICU we can ask cardiology to see him given his respiratory status has improved.  Please hold on any central line acces for now if possible until we clear bacteremia. On adequate coverage with Cefazolin and will continue this.  Of note he has pulmonary mass that is going to require IR to biopsy per oncology team.   His abdomen is benign and has a good appetite. No leukocytosis or persistent fevers. Discussed with his nurse and he is only having some small mushy bowel movements infrequently. Will D/C enteric precautions given not c/w cdiff infection.    PLAN: 1. Continue cefazolin  2. Acyclovir prophylaxis  3. Will need cardiology consult for TEE  4. Follow repeat blood cultures 5. Hold on PICC or central access for now    Principal Problem:   MSSA bacteremia Active Problems:   AKI (acute kidney injury) ([place])   Pathologic fracture   Hyponatremia   Multiple myeloma not having achieved remission ([place])   Diabetes mellitus ([place])   Hypertension   Constipation   Incomplete paraplegia (HCC)   Pancytopenia (HCC)   AMS (altered mental status)   Intracerebral hemorrhage   Pneumonia    (feeding supplement) PROSource Plus  30 mL Oral BID BM   sodium chloride   Intravenous Once   acyclovir  400 mg Oral Daily   carvedilol  3.125 mg Oral BID WC   Chlorhexidine Gluconate Cloth  6 each Topical Daily   dexamethasone (DECADRON) injection  2 mg Intravenous Q12H   fluconazole  100 mg Oral Daily   insulin  aspart  0-9 Units Subcutaneous TID WC   insulin aspart  3 Units Subcutaneous TID WC   insulin glargine  15 Units Subcutaneous QHS   lidocaine  2 patch Transdermal Q24H   LORazepam  2 mg Intravenous Once   midodrine  7.5 mg Oral BID WC   oxymetazoline  1 spray Each Nare Q6H   pantoprazole  40 mg Oral BID AC   polyethylene glycol  17 g Oral Daily   Ensure Max Protein  11 oz Oral BID BM   senna-docusate  2 tablet Oral BID   sodium chloride  2 spray Each Nare Q1H while awake   sucralfate  1 g Oral TID WC & HS    SUBJECTIVE: Tablet translator utilized for visit today with patient and his wife.  She is very happy with the improvement overnight that he has had. Off oxygen now and breathing more comfortably. Has a good appetitie now.   Rapid response event noted - encephalopathy with over sedating meds vs seizures?    Review of Systems: Review of Systems  Constitutional: Negative for chills and fever.  HENT: Negative for tinnitus.   Eyes: Negative for blurred vision and photophobia.  Respiratory: Negative for cough and sputum production.   Cardiovascular: Positive for chest pain.  Gastrointestinal: Negative for diarrhea, nausea and vomiting.  Genitourinary: Negative for dysuria.  Skin: Negative  for rash.  Neurological: Negative for headaches.    Allergies  Allergen Reactions   Other     pollen   Penicillins     unknown    OBJECTIVE: Vitals:   03/22/20 0800 03/22/20 0848 03/22/20 0900 03/22/20 1000  BP: 101/62  112/75 119/72  Pulse: 93  (!) 101 (!) 104  Resp: '16  17 18  ' Temp: 98.5 F (36.9 C)     TempSrc: Oral     SpO2: 96% 99% 96% 96%  Weight:       Body mass index is 22.34 kg/m.  Physical Exam Constitutional:      Appearance: He is well-developed.     Comments: Seated comfortably in chair during visit.   HENT:     Mouth/Throat:     Dentition: Normal dentition. No dental abscesses.  Cardiovascular:     Rate and Rhythm: Normal rate and regular  rhythm.     Heart sounds: Normal heart sounds.  Pulmonary:     Effort: Pulmonary effort is normal.     Breath sounds: Normal breath sounds.     Comments: Off oxygen today. Breathing comfortably and normal rate w/o accessory use  Abdominal:     General: There is no distension.     Palpations: Abdomen is soft.     Tenderness: There is no abdominal tenderness.  Lymphadenopathy:     Cervical: No cervical adenopathy.  Skin:    General: Skin is warm and dry.     Findings: No rash.  Neurological:     Mental Status: He is alert and oriented to person, place, and time.  Psychiatric:        Judgment: Judgment normal.     Comments: In good spirits today. Participates in conversation      Lab Results Lab Results  Component Value Date   WBC 3.3 (L) 03/22/2020   HGB 7.4 (L) 03/22/2020   HCT 21.0 (L) 03/22/2020   MCV 84.0 03/22/2020   PLT 40 (L) 03/22/2020    Lab Results  Component Value Date   CREATININE 1.02 03/22/2020   BUN 40 (H) 03/22/2020   NA 125 (L) 03/22/2020   K 3.4 (L) 03/22/2020   CL 97 (L) 03/22/2020   CO2 19 (L) 03/22/2020    Lab Results  Component Value Date   ALT 21 03/22/2020   AST 35 03/22/2020   ALKPHOS 73 03/22/2020   BILITOT 0.5 03/22/2020     Microbiology: Recent Results (from the past 240 hour(s))  Culture, Urine     Status: Abnormal   Collection Time: 03/23/2020  2:28 PM   Specimen: Urine, Random  Result Value Ref Range Status   Specimen Description URINE, RANDOM  Final   Special Requests NONE  Final   Culture (A)  Final    <10,000 COLONIES/mL INSIGNIFICANT GROWTH Performed at [facility], [address], [place], [place] [address]    Report Status 03/20/2020 FINAL  Final  Culture, blood (routine x 2)     Status: Abnormal   Collection Time: 03/11/2020  6:02 PM   Specimen: BLOOD  Result Value Ref Range Status   Specimen Description BLOOD RIGHT ANTECUBITAL  Final   Special Requests   Final    BOTTLES DRAWN AEROBIC AND ANAEROBIC Blood  Culture adequate volume   Culture  Setup Time   Final    GRAM POSITIVE COCCI IN CLUSTERS IN BOTH AEROBIC AND ANAEROBIC BOTTLES Organism ID to follow CRITICAL RESULT CALLED TO, READ BACK BY AND  VERIFIED WITH: Oswaldo Done, AT 9357 03/20/20 BY Rush Landmark Performed at [facility], [place] [address], [place], [place] [address]    Culture STAPHYLOCOCCUS AUREUS (A)  Final   Report Status 03/22/2020 FINAL  Final   Organism ID, Bacteria STAPHYLOCOCCUS AUREUS  Final      Susceptibility   Staphylococcus aureus - MIC*    CIPROFLOXACIN <=0.5 SENSITIVE Sensitive     ERYTHROMYCIN <=0.25 SENSITIVE Sensitive     GENTAMICIN <=0.5 SENSITIVE Sensitive     OXACILLIN 0.5 SENSITIVE Sensitive     TETRACYCLINE <=1 SENSITIVE Sensitive     VANCOMYCIN 1 SENSITIVE Sensitive     TRIMETH/SULFA <=10 SENSITIVE Sensitive     CLINDAMYCIN <=0.25 SENSITIVE Sensitive     RIFAMPIN <=0.5 SENSITIVE Sensitive     Inducible Clindamycin NEGATIVE Sensitive     * STAPHYLOCOCCUS AUREUS  Blood Culture ID Panel (Reflexed)     Status: Abnormal   Collection Time: 03/23/2020  6:02 PM  Result Value Ref Range Status   Enterococcus faecalis NOT DETECTED NOT DETECTED Final   Enterococcus Faecium NOT DETECTED NOT DETECTED Final   Listeria monocytogenes NOT DETECTED NOT DETECTED Final   Staphylococcus species DETECTED (A) NOT DETECTED Final    Comment: CRITICAL RESULT CALLED TO, READ BACK BY AND VERIFIED WITH: Barth Kirks PHARMD, AT 0740 03/20/20 BY D. VANHOOK    Staphylococcus aureus (BCID) DETECTED (A) NOT DETECTED Final    Comment: CRITICAL RESULT CALLED TO, READ BACK BY AND VERIFIED WITH: Barth Kirks PHARMD, AT 0740 03/20/20 BY D. VANHOOK    Staphylococcus epidermidis NOT DETECTED NOT DETECTED Final   Staphylococcus lugdunensis NOT DETECTED NOT DETECTED Final   Streptococcus species NOT DETECTED NOT DETECTED Final   Streptococcus agalactiae NOT DETECTED NOT DETECTED Final   Streptococcus pneumoniae NOT DETECTED NOT DETECTED  Final   Streptococcus pyogenes NOT DETECTED NOT DETECTED Final   A.calcoaceticus-baumannii NOT DETECTED NOT DETECTED Final   Bacteroides fragilis NOT DETECTED NOT DETECTED Final   Enterobacterales NOT DETECTED NOT DETECTED Final   Enterobacter cloacae complex NOT DETECTED NOT DETECTED Final   Escherichia coli NOT DETECTED NOT DETECTED Final   Klebsiella aerogenes NOT DETECTED NOT DETECTED Final   Klebsiella oxytoca NOT DETECTED NOT DETECTED Final   Klebsiella pneumoniae NOT DETECTED NOT DETECTED Final   Proteus species NOT DETECTED NOT DETECTED Final   Salmonella species NOT DETECTED NOT DETECTED Final   Serratia marcescens NOT DETECTED NOT DETECTED Final   Haemophilus influenzae NOT DETECTED NOT DETECTED Final   Neisseria meningitidis NOT DETECTED NOT DETECTED Final   Pseudomonas aeruginosa NOT DETECTED NOT DETECTED Final   Stenotrophomonas maltophilia NOT DETECTED NOT DETECTED Final   Candida albicans NOT DETECTED NOT DETECTED Final   Candida auris NOT DETECTED NOT DETECTED Final   Candida glabrata NOT DETECTED NOT DETECTED Final   Candida krusei NOT DETECTED NOT DETECTED Final   Candida parapsilosis NOT DETECTED NOT DETECTED Final   Candida tropicalis NOT DETECTED NOT DETECTED Final   Cryptococcus neoformans/gattii NOT DETECTED NOT DETECTED Final   Meth resistant mecA/C and MREJ NOT DETECTED NOT DETECTED Final    Comment: Performed at [facility], [address] [place], [place] [address]  SARS Coronavirus 2 by RT PCR (hospital order, performed in [facility] hospital lab) Nasopharyngeal Nasopharyngeal Swab     Status: None   Collection Time: 03/11/2020  8:37 PM   Specimen: Nasopharyngeal Swab  Result Value Ref Range Status   SARS Coronavirus 2 NEGATIVE NEGATIVE Final  Comment: (NOTE) SARS-CoV-2 target nucleic acids are NOT DETECTED.  The SARS-CoV-2 RNA is generally detectable in upper and lower respiratory specimens during the acute phase of infection. The  lowest concentration of SARS-CoV-2 viral copies this assay can detect is 250 copies / mL. A negative result does not preclude SARS-CoV-2 infection and should not be used as the sole basis for treatment or other patient management decisions.  A negative result may occur with improper specimen collection / handling, submission of specimen other than nasopharyngeal swab, presence of viral mutation(s) within the areas targeted by this assay, and inadequate number of viral copies (<250 copies / mL). A negative result must be combined with clinical observations, patient history, and epidemiological information.  Fact Sheet for Patients:   StrictlyIdeas.no  Fact Sheet for Healthcare Providers: BankingDealers.co.za  This test is not yet approved or  cleared by the [place] FDA and has been authorized for detection and/or diagnosis of SARS-CoV-2 by FDA under an Emergency Use Authorization (EUA).  This EUA will remain in effect (meaning this test can be used) for the duration of the COVID-19 declaration under Section 564(b)(1) of the Act, 21 U.S.C. section 360bbb-3(b)(1), unless the authorization is terminated or revoked sooner.  Performed at [facility], [place] [address] [place], [place] [address]   MRSA PCR Screening     Status: None   Collection Time: 03/20/20  4:21 AM   Specimen: Nasopharyngeal  Result Value Ref Range Status   MRSA by PCR NEGATIVE NEGATIVE Final    Comment:        The GeneXpert MRSA Assay (FDA approved for NASAL specimens only), is one component of a comprehensive MRSA colonization surveillance program. It is not intended to diagnose MRSA infection nor to guide or monitor treatment for MRSA infections. Performed at [facility], [place] [address], [place], [place] [address]   Respiratory Panel by PCR     Status: None   Collection Time: 03/20/20  4:21 AM   Specimen: Nasopharyngeal Swab; Respiratory   Result Value Ref Range Status   Adenovirus NOT DETECTED NOT DETECTED Final   Coronavirus 229E NOT DETECTED NOT DETECTED Final    Comment: (NOTE) The Coronavirus on the Respiratory Panel, DOES NOT test for the novel  Coronavirus (2019 nCoV)    Coronavirus HKU1 NOT DETECTED NOT DETECTED Final   Coronavirus NL63 NOT DETECTED NOT DETECTED Final   Coronavirus OC43 NOT DETECTED NOT DETECTED Final   Metapneumovirus NOT DETECTED NOT DETECTED Final   Rhinovirus / Enterovirus NOT DETECTED NOT DETECTED Final   Influenza A NOT DETECTED NOT DETECTED Final   Influenza B NOT DETECTED NOT DETECTED Final   Parainfluenza Virus 1 NOT DETECTED NOT DETECTED Final   Parainfluenza Virus 2 NOT DETECTED NOT DETECTED Final   Parainfluenza Virus 3 NOT DETECTED NOT DETECTED Final   Parainfluenza Virus 4 NOT DETECTED NOT DETECTED Final   Respiratory Syncytial Virus NOT DETECTED NOT DETECTED Final   Bordetella pertussis NOT DETECTED NOT DETECTED Final   Chlamydophila pneumoniae NOT DETECTED NOT DETECTED Final   Mycoplasma pneumoniae NOT DETECTED NOT DETECTED Final    Comment: Performed at [facility], [place] [address] [place], [place] [address]  Culture, blood (Routine X 2) w Reflex to ID Panel     Status: None (Preliminary result)   Collection Time: 03/21/20  1:59 PM   Specimen: BLOOD  Result Value Ref Range Status   Specimen Description BLOOD BLOOD RIGHT HAND  Final   Special  Requests   Final    BOTTLES DRAWN AEROBIC AND ANAEROBIC Blood Culture results may not be optimal due to an excessive volume of blood received in culture bottles   Culture   Final    NO GROWTH < 24 HOURS Performed at [facility] [address] [place], [place] [address]    Report Status PENDING  Incomplete  Culture, blood (Routine X 2) w Reflex to ID Panel     Status: None (Preliminary result)   Collection Time: 03/21/20  1:59 PM   Specimen: BLOOD  Result Value Ref Range Status   Specimen Description BLOOD BLOOD LEFT  HAND  Final   Special Requests   Final    BOTTLES DRAWN AEROBIC AND ANAEROBIC Blood Culture results may not be optimal due to an excessive volume of blood received in culture bottles   Culture   Final    NO GROWTH < 24 HOURS Performed at [facility], [place] [address], [place], [place] [address]    Report Status PENDING  Incomplete    Janene Madeira, MSN, NP-C [facility] for Infectious Disease [email or web address] Pager: [phone] Office: [phone] [place]: [phone]

## 2020-03-22 NOTE — Consult Note (Signed)
WOC Nurse Consult Note: Assessment completed in [facility] 4N15.  Reason for Consult: Sacral wound Wound type:  Left upper buttock with partial thickness tissue loss seems primarily associated with MASD/IAD. Wound bed is pink.  Right upper buttock purple and maroon in color full thickness, evolving DTPI. Measurement:  Left upper buttock-4cm x 2.5cm Right upper buttock-2.5cm x 3.5 cm Drainage (amount, consistency, odor) None Periwound: Right buttock wound with slight erythema and induration Dressing procedure/placement/frequency: Clean bilateral buttock wounds with soap and water. Pat dry. Apply 44M skin barrier wipes Kellie Simmering # [phone]) and allow to air dry. Place hydrocolloid Kellie Simmering # [phone]) on each buttock. Change daily.  Monitor the wound area(s) for worsening of condition such as: Signs/symptoms of infection,  Increase in size,  Development of or worsening of odor, Development of pain, or increased pain at the affected locations.  Notify the medical team if any of these develop.  Thank you for the consult.  Discussed plan of care with the patient and bedside nurse.  [facility] nurse will not follow at this time.  Please re-consult the [facility] team if needed.  Cathlean Marseilles Tamala Julian, MSN, RN, [place], [place], [facility] Wound Treatment Associate Wound, Ostomy, Continence Nurse Office [phone]  Pager [phone]

## 2020-03-22 NOTE — Progress Notes (Signed)
EEG completed, results pending. 

## 2020-03-22 NOTE — Progress Notes (Signed)
Was called by CCM as well as hospitalist regarding mental status change. Around 11 p.m. patient noted to be significantly lethargic.  Last normal was around 8 PM when patient was alert and talking with interpreter.  Stat CT head was ordered-no new hemorrhage.  When evaluated by CCM NP as well as rapid response-patient had uppercase, spontaneously opening his eyes.  But unresponsive to painful stimulus.  On the way to CT patient started to respond and moves all limbs spontaneously, however would not answer questions or follow commands.  He then had a witnessed episode of shaking.  Recommended giving 1 g of Keppra. Went to evaluate the patient in about 15 minutes, patient was awake, alert and oriented x2, could repeat sentences and follow commands appropriately.   Recommendations Continue Keppra 500 mg twice daily Check ammonia level EEG tomorrow morning Frequent neurochecks

## 2020-03-22 NOTE — Progress Notes (Signed)
Physical Therapy Treatment Patient Details Name: Trevor Shelton MRN: [id number] DOB: 1965-11-27 Today's Date: 03/22/2020    History of Present Illness Pt is 54 yo male with PMH of HTN and HLD.  Pt with recent admission 02/14/20-03/03/20 with neoplasm consistent with multiple myeloma with multiple spinal lesions, L rib fx 9 and 10, and cord compression.  Pt on Decadron for cord compression.  Pt was at inpt rehab and developed nose bleed and fever. Has been admitted with fever, multiple myeloma, AMS, AKI, and pancytopenia.  Pt found to have small intracrainal hemorrhage and severe sepesis with PNE.    PT Comments    Patient progressing slowly due to significant weakness and today with elevated HR with mobility (Hgb 7.4).  Patient would benefit from return to CIR for ongoing patient and family education for hopeful return home.  PT to continue to follow acutely.    Follow Up Recommendations  CIR     Equipment Recommendations  Wheelchair (measurements PT);Wheelchair cushion (measurements PT);3in1 (PT)    Recommendations for Other Services       Precautions / Restrictions Precautions Precautions: Fall Precaution Comments: Significant BLE weakness and ataxia-like movements; back and rib precautions for comfort due to fxs    Mobility  Bed Mobility Overal bed mobility: Needs Assistance   Rolling: Mod assist;+2 for physical assistance Sidelying to sit: Mod assist;+2 for physical assistance       General bed mobility comments: assist to initiate with L knee flexion to roll to R and assist for reaching with L UE and to lift trunk  Transfers Overall transfer level: Needs assistance Equipment used: Ambulation equipment used Transfers: Sit to/from [facility] Sit to Stand: Mod assist;+2 physical assistance Stand pivot transfers: Total assist       General transfer comment: performed sit to stand x 2 from EOB with max cues, lifting help and assist for R LE placement on stedy;  assist for balance, for postural extension and safety due to fatigues quickly  Ambulation/Gait                 Stairs             Wheelchair Mobility    Modified Rankin (Stroke Patients Only) Modified Rankin (Stroke Patients Only) Pre-Morbid Rankin Score: Severe disability Modified Rankin: Severe disability     Balance Overall balance assessment: Needs assistance   Sitting balance-Leahy Scale: Poor Sitting balance - Comments: needs UE support and minguard to S for safety at EOB   Standing balance support: Bilateral upper extremity supported Standing balance-Leahy Scale: Zero Standing balance comment: mod A of 2 and stedy to block knees for balance standing about 15 seconds today                            Cognition Arousal/Alertness: Awake/alert Behavior During Therapy: WFL for tasks assessed/performed Overall Cognitive Status: Impaired/Different from baseline                                 General Comments: admitted with encephalopathy/sepsis, but following commands today and answering questions well through interpreter, wife interjecting, but in [place] and video interpreter unable to hear      Exercises      General Comments General comments (skin integrity, edema, etc.): HR 120's with mobility today, wife present at bedside, used stratus video interpreter [place]      Pertinent Vitals/Pain  Pain Assessment: Faces Faces Pain Scale: Hurts little more Pain Location: chest Pain Descriptors / Indicators: Discomfort Pain Intervention(s): Monitored during session;Repositioned;Patient requesting pain meds-RN notified    Home Living                      Prior Function            PT Goals (current goals can now be found in the care plan section) Progress towards PT goals: Progressing toward goals    Frequency    Min 4X/week      PT Plan Current plan remains appropriate    Co-evaluation               AM-PAC PT "6 Clicks" Mobility   Outcome Measure  Help needed turning from your back to your side while in a flat bed without using bedrails?: A Lot Help needed moving from lying on your back to sitting on the side of a flat bed without using bedrails?: Total Help needed moving to and from a bed to a chair (including a wheelchair)?: Total Help needed standing up from a chair using your arms (e.g., wheelchair or bedside chair)?: Total Help needed to walk in hospital room?: Total Help needed climbing 3-5 steps with a railing? : Total 6 Click Score: 7    End of Session Equipment Utilized During Treatment: Gait belt Activity Tolerance: Patient limited by fatigue Patient left: in chair;with call bell/phone within reach Nurse Communication: Mobility status;Need for lift equipment PT Visit Diagnosis: Muscle weakness (generalized) (M62.81);Difficulty in walking, not elsewhere classified (R26.2);Other abnormalities of gait and mobility (R26.89)     Time: 3382-5053 PT Time Calculation (min) (ACUTE ONLY): 41 min  Charges:  $Therapeutic Activity: 38-52 mins                     Magda Kiel, PT Acute Rehabilitation Services [id number]:[phone] Office:[phone] 03/22/2020    Trevor Shelton 03/22/2020, 1:42 PM

## 2020-03-23 ENCOUNTER — Inpatient Hospital Stay (HOSPITAL_COMMUNITY): Payer: Self-pay

## 2020-03-23 LAB — CBC WITH DIFFERENTIAL/PLATELET
Abs Immature Granulocytes: 0.19 10*3/uL — ABNORMAL HIGH (ref 0.00–0.07)
Basophils Absolute: 0 10*3/uL (ref 0.0–0.1)
Basophils Relative: 0 %
Eosinophils Absolute: 0 10*3/uL (ref 0.0–0.5)
Eosinophils Relative: 0 %
HCT: 23.2 % — ABNORMAL LOW (ref 39.0–52.0)
Hemoglobin: 7.8 g/dL — ABNORMAL LOW (ref 13.0–17.0)
Immature Granulocytes: 6 %
Lymphocytes Relative: 5 %
Lymphs Abs: 0.2 10*3/uL — ABNORMAL LOW (ref 0.7–4.0)
MCH: 29 pg (ref 26.0–34.0)
MCHC: 33.6 g/dL (ref 30.0–36.0)
MCV: 86.2 fL (ref 80.0–100.0)
Monocytes Absolute: 0.2 10*3/uL (ref 0.1–1.0)
Monocytes Relative: 5 %
Neutro Abs: 2.8 10*3/uL (ref 1.7–7.7)
Neutrophils Relative %: 84 %
Platelets: 41 10*3/uL — ABNORMAL LOW (ref 150–400)
RBC: 2.69 MIL/uL — ABNORMAL LOW (ref 4.22–5.81)
RDW: 13.8 % (ref 11.5–15.5)
WBC Morphology: INCREASED
WBC: 3.4 10*3/uL — ABNORMAL LOW (ref 4.0–10.5)
nRBC: 0 % (ref 0.0–0.2)

## 2020-03-23 LAB — MAGNESIUM: Magnesium: 1.7 mg/dL (ref 1.7–2.4)

## 2020-03-23 LAB — COMPREHENSIVE METABOLIC PANEL
ALT: 29 U/L (ref 0–44)
AST: 45 U/L — ABNORMAL HIGH (ref 15–41)
Albumin: 1.5 g/dL — ABNORMAL LOW (ref 3.5–5.0)
Alkaline Phosphatase: 70 U/L (ref 38–126)
Anion gap: 8 (ref 5–15)
BUN: 40 mg/dL — ABNORMAL HIGH (ref 6–20)
CO2: 19 mmol/L — ABNORMAL LOW (ref 22–32)
Calcium: 8.2 mg/dL — ABNORMAL LOW (ref 8.9–10.3)
Chloride: 102 mmol/L (ref 98–111)
Creatinine, Ser: 0.94 mg/dL (ref 0.61–1.24)
GFR calc Af Amer: >60 mL/min (ref >60)
GFR calc non Af Amer: >60 mL/min (ref >60)
Glucose, Bld: 266 mg/dL — ABNORMAL HIGH (ref 70–99)
Potassium: 4 mmol/L (ref 3.5–5.1)
Sodium: 129 mmol/L — ABNORMAL LOW (ref 135–145)
Total Bilirubin: 0.2 mg/dL — ABNORMAL LOW (ref 0.3–1.2)
Total Protein: 5.7 g/dL — ABNORMAL LOW (ref 6.5–8.1)

## 2020-03-23 LAB — PHOSPHORUS: Phosphorus: 2.7 mg/dL (ref 2.5–4.6)

## 2020-03-23 LAB — GLUCOSE, CAPILLARY
Glucose-Capillary: 231 mg/dL — ABNORMAL HIGH (ref 70–99)
Glucose-Capillary: 303 mg/dL — ABNORMAL HIGH (ref 70–99)
Glucose-Capillary: 264 mg/dL — ABNORMAL HIGH (ref 70–99)

## 2020-03-23 MED ORDER — MELATONIN 3 MG PO TABS
3.0000 mg | ORAL_TABLET | Freq: Every day | ORAL | Status: DC
  Administered 2020-03-23 – 2020-04-05 (×13): 3 mg via ORAL
  Filled 2020-03-23 (×15): qty 1

## 2020-03-23 MED ORDER — INSULIN GLARGINE 100 UNIT/ML ~~LOC~~ SOLN
25.0000 [IU] | Freq: Every day | SUBCUTANEOUS | Status: DC
  Administered 2020-03-23: 25 [IU] via SUBCUTANEOUS
  Filled 2020-03-23 (×2): qty 0.25

## 2020-03-23 NOTE — Progress Notes (Signed)
  Radiation Oncology         [phone] ________________________________  Name: Trevor Shelton MRN: [id number]  Date: 03/02/2020  DOB: 04/25/1966  End of Treatment Note  Diagnosis:   Multiple Myeloma      Indication for treatment::  palliative       Radiation treatment dates:   02/18/20 - 03/02/20  Site/dose:   The T spine T4-10 was treated to a dose of 30 Gy in 10 fractions using a 2-field isodose technique.    Narrative: The patient tolerated radiation treatment relatively well.     Plan: The patient has completed radiation treatment. The patient will return to radiation oncology clinic for routine followup in one month. I advised the patient to call or return sooner if they have any questions or concerns related to their recovery or treatment. ________________________________  Jodelle Gross, M.D., Ph.D.

## 2020-03-23 NOTE — Plan of Care (Signed)
  Problem: Ischemic Stroke/TIA Tissue Perfusion: Goal: Complications of ischemic stroke/TIA will be minimized Outcome: Progressing   

## 2020-03-23 NOTE — Progress Notes (Signed)
Physical Therapy Treatment Patient Details Name: Trevor Shelton MRN: [id number] DOB: [birth year] Today's Date: 03/23/2020    History of Present Illness Pt is 54 yo male with PMH of HTN and HLD.  Pt with recent admission 02/14/20-03/03/20 with neoplasm consistent with multiple myeloma with multiple spinal lesions, L rib fx 9 and 10, and cord compression.  Pt on Decadron for cord compression.  Pt was at inpt rehab and developed nose bleed and fever. Has been admitted with fever, multiple myeloma, AMS, AKI, and pancytopenia.  Pt found to have small intracrainal hemorrhage and severe sepesis with PNE.    PT Comments    Patient progressing with mobility able to tolerate in bed LE strengthening exercises and tolerating multiple sit to stand from seat height on steady.  Feel he remains appropriate for CIR level rehab at d/c.   Follow Up Recommendations  CIR     Equipment Recommendations  Wheelchair (measurements PT);Wheelchair cushion (measurements PT);3in1 (PT)    Recommendations for Other Services       Precautions / Restrictions Precautions Precautions: Fall Precaution Comments: Significant BLE weakness and ataxia-like movements; back and rib precautions for comfort due to fxs    Mobility  Bed Mobility Overal bed mobility: Needs Assistance Bed Mobility: Rolling;Sidelying to Sit Rolling: Mod assist Sidelying to sit: Mod assist;Max assist       General bed mobility comments: able to move up to EOB with +1 A; still needing cues and increased time even after interpreter delay to initiate movements toward EOB  Transfers Overall transfer level: Needs assistance Equipment used: Ambulation equipment used Transfers: Sit to/from [facility] Sit to Stand: Mod assist;+2 physical assistance Stand pivot transfers: Total assist       General transfer comment: up to stand in stedy from bed with+2 mod A cues for technique and assist for RLE stability on platform of Stedy; stood x 1  from EOB and x 3 from stedy  Ambulation/Gait             General Gait Details: unable to safely attempt   Stairs             Wheelchair Mobility    Modified Rankin (Stroke Patients Only) Modified Rankin (Stroke Patients Only) Pre-Morbid Rankin Score: Severe disability Modified Rankin: Severe disability     Balance Overall balance assessment: Needs assistance Sitting-balance support: Feet supported Sitting balance-Leahy Scale: Poor Sitting balance - Comments: heavily reliant on UE support       Standing balance comment: use of stedy and mod A of 2 for balance                            Cognition Arousal/Alertness: Awake/alert Behavior During Therapy: WFL for tasks assessed/performed Overall Cognitive Status: Impaired/Different from baseline                                 General Comments: increased time to follow commands especially with interpreter delay      Exercises Other Exercises Other Exercises: stablized bridging in bed 3 sec hold x 10 Other Exercises: coordination placing R LE in hooklying with assist Other Exercises: seated LAQ w/ 3 sec hold x 10    General Comments General comments (skin integrity, edema, etc.): RN in room to assist as well with lines for safety transfer      Pertinent Vitals/Pain Pain Assessment: Faces Faces Pain Scale: Hurts  little more Pain Location: chest and L ribs Pain Descriptors / Indicators: Discomfort Pain Intervention(s): Monitored during session;Repositioned    Home Living                      Prior Function            PT Goals (current goals can now be found in the care plan section) Progress towards PT goals: Progressing toward goals    Frequency    Min 4X/week      PT Plan Current plan remains appropriate    Co-evaluation              AM-PAC PT "6 Clicks" Mobility   Outcome Measure  Help needed turning from your back to your side while in a flat  bed without using bedrails?: A Lot Help needed moving from lying on your back to sitting on the side of a flat bed without using bedrails?: A Lot Help needed moving to and from a bed to a chair (including a wheelchair)?: Total Help needed standing up from a chair using your arms (e.g., wheelchair or bedside chair)?: Total Help needed to walk in hospital room?: Total Help needed climbing 3-5 steps with a railing? : Total 6 Click Score: 8    End of Session Equipment Utilized During Treatment: Gait belt Activity Tolerance: Patient tolerated treatment well Patient left: in chair;with call bell/phone within reach;with chair alarm set Nurse Communication: Mobility status PT Visit Diagnosis: Muscle weakness (generalized) (M62.81);Difficulty in walking, not elsewhere classified (R26.2);Other abnormalities of gait and mobility (R26.89)     Time: 4270-6237 PT Time Calculation (min) (ACUTE ONLY): 44 min  Charges:  $Therapeutic Exercise: 23-37 mins $Therapeutic Activity: 8-22 mins                     Trevor Shelton, PT Acute Rehabilitation Services SEGBT:[phone] Office:[phone] 03/23/2020    Trevor Shelton 03/23/2020, 2:26 PM

## 2020-03-23 NOTE — Progress Notes (Signed)
[facility] Progress Note  Name: Trevor Shelton MRN: [id number] DOB: 12-Apr-1966  Subjective:  He has been on NS at 60 ml/hr.  Still in ICU - team is moving out soon they think.  Feels ok.   Review of systems:    He denies overt shortness of breath  Denies cp  Denies n/v  ---------- Background on consult:  HPI: The patient is a 54 y.o. year-old w/ hx of HTN, HL presented on 02/14/20 w/ back pain, L lower rib pain and constipation. Labs showed ^^Ca++, AKI, Na 125. CT showed diffuse lytic lesions c/w myeloma. Pt was admitted and was diagnosed w/ kappa LC multiple myeloma, admitted 8/08- 8/26.  He received XRT to spinal cord x 10 sessions. Renal saw him for AKI, creat admit was 4.9 and improved to 1.53, had 1-2 HD sessions. Had some low Na levels attributed to pseudohyponatremia from paraproteinemia +/- vol depletion or SIADH from some N/V he was having. He was dc'd and admitted to CIR on 8/26.  He had chemorx on 9/7 w/ next round scheduled for 9/14. He had nosebleed on 9/9 and got 2u prbc's.  On 9/11 pt spiked temp to 102 deg. Blood cx's from yesterday are growing Staph aureus, 4/4.  Labs yest showed Na 114, creat 1.0 and BUN 47, KUB ++ stool, UA neg.  UNa 11, Uosm 568.  Pt got IVF's at 50 cc/hr and Na improved to 121 this am.  Asked to see for hyponatremia.    Intake/Output Summary (Last 24 hours) at 03/23/2020 0815 Last data filed at 03/23/2020 0600 Gross per 24 hour  Intake 1522.93 ml  Output 2450 ml  Net -927.07 ml    Vitals:  Vitals:   03/23/20 0400 03/23/20 0500 03/23/20 0600 03/23/20 0736  BP: 110/67 120/70 112/70   Pulse: 69 73 80   Resp: '13 14 14   ' Temp: 97.7 F (36.5 C)   97.6 F (36.4 C)  TempSrc: Axillary   Oral  SpO2: 100% 98% 100%   Weight:         Physical Exam General adult male in bed in NAD at rest  HEENT normocephalic atraumatic extraocular movements intact sclera anicteric Neck supple trachea midline Lungs clear anteriorly; short of breath with  exertion but not at rest Heart tachycardic; no rub  Abdomen soft nontender nondistended Extremities he has no edema  Neuro - alert and oriented to at least person and year.  Follows commands    Medications reviewed  Labs:  BMP Latest Ref Rng & Units 03/23/2020 03/22/2020 03/21/2020  Glucose 70 - 99 mg/dL 266(H) 195(H) 202(H)  BUN 6 - 20 mg/dL 40(H) 40(H) 41(H)  Creatinine 0.61 - 1.24 mg/dL 0.94 1.02 1.06  Sodium 135 - 145 mmol/L 129(L) 125(L) 124(L)  Potassium 3.5 - 5.1 mmol/L 4.0 3.4(L) 3.5  Chloride 98 - 111 mmol/L 102 97(L) 97(L)  CO2 22 - 32 mmol/L 19(L) 19(L) 18(L)  Calcium 8.9 - 10.3 mg/dL 8.2(L) 8.1(L) 8.0(L)     Assessment/Plan:   1. Hyponatremia - in pt w/ new dx mult myeloma, sp 1st round chemorx on 9/07.  Tprot not very high, serum osm is low suggesting true hyponatremia.  felt volume depleted on consult.  Kidneys appear normal by CT. TSH low normal. Cortisol ok  1. Sodium of 129 corrects to 132/133 with correction for hyperglycemia  2. He is to finish the NS at 68m/hr x 24 hours; hydration limited per resp status with PNA  3. We Stopped trazodone 2.  Hx AKI due to myeloma - resolved (4.7 on admssion).  3. Multiple myeloma - sp chemoRx 1st round on 9/07 4. Fevers/ staph sepsis - +SOB, ?gallop on exam. Note TTE ordered to r/o valve insuff. Noted grade 1 diastolic dysfunction.  5. PNA - Abx per primary team.  On ancef  Spoke with primary team.  Sodium normalizing - nephrology will sign off.   Claudia Desanctis, MD 03/23/2020  8:22 AM

## 2020-03-23 NOTE — Progress Notes (Signed)
PROGRESS NOTE    Trevor Shelton  WPY:[id number] DOB: 1966-02-23 DOA: 03/30/2020 PCP: Patient, No Pcp Per   Brief Narrative: 54 year old male with history of hypertension,hyperlipidemia was admitted 8/8-8/26 discharged to inpatient rehab. He was low because endoscope found to have multiple myeloma with spinal active lesions, left rib fracture on 03/18/2017,impending cord compression and underwent radiation therapy, decadron, impending cord compression s/p CyBorD chemotherapy recently, he was on HD short term.  He was discharged to inpatient rehab on 03/03/2020.  He underwent chemotherapy on 03/15/2020 and next chemotherapy is scheduled for 03/22/2020.  Patient had significant nosebleed on 03/17/2020.  He received 2 units PRBCs on 03/18/2020 due to hemoglobin 6.8.  Lovenox was stopped.  Patient spiked fevers with T-max 102 this afternoon. A CT chest/abdmen/pelvis was ordered but not done yet. His labs showed Na 114, CL 86, CO2 19, BUN 47, Cr 1.06, WBC 4.6 (was 2.9 on 9/10), hemoglobin 10, PLT 46 which is at his baseline, nonrevealing UA.  Covid is negative.CXR showed Areas of patchy atelectasis bilaterally. KUB showed stool burden. Patient was empirically started on vancomycin and Zosyn at inpatient rehab. he was transferred under [facility] 9/12 am.  CT chest/abd pelvis was done: 1. Mild to moderate severity bilateral areas of atelectasis and/or infiltrate. 2. 5.2 cm x 1.5 cm pleural based soft tissue mass along the anterior aspect of the left upper lobe. A 1.4 cm pleural based soft tissue nodule is seen within this region on the prior study. These findings are concerning for the presence of a primary lung malignancy. Further evaluation with a nuclear medicine PET/CT is recommended.  3. Findings consistent with multiple myeloma versus diffuse osseous metastasis, with chronic pathologic fractures at the levels of T6 and T9 vertebral bodies. 4. Markedly enlarged prostate gland. 5. 4.0 cm x 3.2 cm fat-containing right  scrotal hernia.  03/20/20: MSSA bacteremia- abx de-escalated to Ancef, Dr. Johnnye Sima ID consulted.  Also found to have a small intracranial hemorrhage seen by neurology, MRI brain and repeat CT.  6 hours later which showed decrease in size of conspicuity of unknown focus of intracranial hemorrhage.  Patient was managed on Percocet. Also seen by oncologist 9/13: Ongoing epistaxis and plt down to 25k- 1 unit platelet transfusion ongoing oncology 9.13-14 overnight patient was more lethargic and less responsive also having trouble breathing possible seizure seen by neurology repeat CT head no acute finding, loaded with Keppra transferred to ICU 4N and was transferred he has been more alert/awake. EEG -moderate diffuse encephalopathy, nonspecific, no seizures or epileptiform discharges.  Patient was monitored in ICU 24 hours until 9/15   Subjective:  On 3l [place], picking on cannula/nose- epistaxis + Alert,awake, bedside spanish language interpretor. Oriented to place, self, mildly confused c/o hurting all over Type 7 stool charted- reflex c diff ordered   Assessment & Plan:  Stroke with acute intracranial hemorrhage involving the anterior-inferior right frontal convexity likely due to thrombocytopenia from effect of multiple myeloma and chemotherapy.  Repeat CT head decreased size of right frontal anterior inferior convexity motors, MRI.  Acute intracranial hemorrhage stable.  Seen by neurology no further recommendation.  LDL 109 hemoglobin A1c 6.5, 2D echo and carotid 2D echo not indicated for cva.  Is more alert awake.  Monitor platelet count.  Avoid anticoagulants/heparin Lovenox  Severe Sepsis due to MSSA bacteremia: Patient immunocompromised with recent chemo, multiple myeloma but no central line, source could be pneumonia.  ID following, currently on Ancef, acyclovir prophylaxis.  ID requesting TEE and I have consulted cardiology.  Repeat blood cultures 9/13 NGTD.  Hold PICC line or central line until  bacteremia clears.  Respiratory virus panel COVID-19 MRSA .  Acute hypoxic respiratory failure/ pneumonia with chest x-ray showing areas of atelectasis bilaterally.  Respiratory status is stable, currently on room air.  Continue Ancef for pneumonia.  Metastatic multiple myeloma:treated with radiation of spine due to concern for impending cord compression- MRI 8.8.21: Epidural tumor extension at T6 and T9 and T6 lesion severely narrowed thecal sac and deformed the spinal cord . He had velcade and dexamethasone, acyclovir, diflucan.  Continue plan as per nephrology.  Pancytopenia/Thrombcytopenia-secondary to patient's recent chemotherapy with Velcade and dexamethasone 03/15/2020.  S/p 1 unit platelet transfusion 9/13.  Elected not uptrending.  Monitor closely oncology following closely.  Recent Labs  Lab 03/22/20 0101 03/22/20 2045 03/23/20 0607  HGB 7.4* 7.0* 7.8*  HCT 21.0* 20.3* 23.2*  WBC 3.3* 3.5* 3.4*  PLT 40* 38* 41*   Anemia from multiple myeloma/chemo: Also acute blood loss with epistaxis: Patient had 2 units PRBC 9/10 for hemoglobin of 6.8. h/h stable transfuse for less than 7 g.  Epistaxis-resolved.  Continue Afrin-change to prn  Acute encephalopathy multifatorial-likely multifactorial in the setting of sepsis, stroke.mri brain w/ small bleed, and also w/ sepsis, hyponatremia.  Appears much more alert awake.  There is question of seizure so was transferred to neuro ICU 9/14 and is on Keppra.EEG showing moderate encephalopathy no epileptiform discharges.  Continue Keppra and further plan per neurology   Chest pain/diffuse body pain likely from likely from myeloma.  Serial troponin negative continue pain control.    Left upper lobe with 5.2/1.5 cm pleural based mass- concern for primary lung malignancy per CT. followed by oncology closely he may need biopsy once stable defer to oncology for timing  Hyponatremia: Likely hypovolemic.  TSH cortisol okay.  Corrected sodium 130/133 due to  hyperglycemia.  Wean off normal saline IV as oral intake improves, as per nephrology . Recent Labs  Lab 03/21/20 1359 03/21/20 1922 03/21/20 2259 03/22/20 0101 03/23/20 0607  NA 123* 122* 124* 125* 129*   AKI/Short term HD in august 2021: Renal function is stable.   Recent Labs  Lab 03/21/20 1359 03/21/20 1922 03/21/20 2259 03/22/20 0101 03/23/20 0607  BUN 40* 44* 41* 40* 40*  CREATININE 1.05 1.13 1.06 5.32 9.92   Metabolic acidosis:Bicarb at 19.Monitor  T2DM-newly diagnosed in august 2021. a1c 6.5.  Blood sugar poorly controlled.  Increase Lantus to 25 units he was on 30 units.  Also on sliding scale.  Monitor and adjust.   Recent Labs  Lab 03/22/20 0733 03/22/20 1231 03/22/20 1528 03/23/20 0735 03/23/20 1106  GLUCAP 216* 250* 305* 231* 303*   Chronic constipation-stable  HTN/HLD- bp meds amlodipine on hold, on cored. Monitor.  Markedly enlarged prostate in CT: PSA normal 1.5  Type 7  Stool charted-C. difficile unable to be sent no diarrhea.    Weakness/debility/physical deconditioning: Continue PT OT, suspect multifactorial in the setting of multiple illness new cancer chemotherapy. Cir eventually.  DVT prophylaxis: SCDs Start: 03/20/20 0020 Code Status:   Code Status: Full Code  Family Communication: Plan of care discussed with patient at bedside with [place] interpreter. If okay with ICU./neuro-okay to transfer to progressive unit. No family at bedside this am on round.  Status is: Inpatient Remains inpatient appropriate because:Ongoing diagnostic testing needed not appropriate for outpatient work up and Remains hospitalized due to ongoing management of MSSA sepsis, pancytopenia  Dispo: The patient is from: [facility]  Anticipated d/c is to: TBD              Anticipated d/c date is: > 3 days              Patient currently is not medically stable to d/c. Nutrition: Diet Order            Diet Carb Modified Fluid consistency: Thin; Room service  appropriate? Yes  Diet effective now                Body mass index is 22.34 kg/m.  Consultants:see note  Procedures: see note.  CT chest abd pelvis w/ contrast: Mild to moderate severity bilateral areas of atelectasis and/or infiltrate. 2. 5.2 cm x 1.5 cm pleural based soft tissue mass along the anterior aspect of the left upper lobe. A 1.4 cm pleural based soft tissue nodule is seen within this region on the prior study. These findings are concerning for the presence of a primary lung malignancy. Further evaluation with a nuclear medicine PET/CT is recommended. 3. Findings consistent with multiple myeloma versus diffuse osseous metastasis, with chronic pathologic fractures at the levels of T6 and T9 vertebral bodies. 4. Markedly enlarged prostate gland. 5. 4.0 cm x 3.2 cm fat-containing right scrotal hernia. 6. Mild cardiomegaly. 7. Small hiatal hernia. 8. Aortic atherosclerosis. 9. Small simple cyst within the left kidney.  Microbiology:see note Blood Culture    Component Value Date/Time   SDES BLOOD RIGHT HAND 03/21/2020 1359   SDES BLOOD LEFT HAND 03/21/2020 1359   SPECREQUEST  03/21/2020 1359    BOTTLES DRAWN AEROBIC AND ANAEROBIC Blood Culture results may not be optimal due to an excessive volume of blood received in culture bottles   SPECREQUEST  03/21/2020 1359    BOTTLES DRAWN AEROBIC AND ANAEROBIC Blood Culture results may not be optimal due to an excessive volume of blood received in culture bottles   CULT  03/21/2020 1359    NO GROWTH < 24 HOURS Performed at [facility], [place] [address], [place], [place] [address]    CULT  03/21/2020 1359    NO GROWTH < 24 HOURS Performed at [facility], [place] [address] [place], [place] [address]    REPTSTATUS PENDING 03/21/2020 1359   REPTSTATUS PENDING 03/21/2020 1359    Other culture-see note  Medications: Scheduled Meds: . (feeding supplement) PROSource Plus  30 mL Oral BID BM  . sodium chloride    Intravenous Once  . acyclovir  400 mg Oral Daily  . carvedilol  3.125 mg Oral BID WC  . Chlorhexidine Gluconate Cloth  6 each Topical Daily  . dexamethasone (DECADRON) injection  2 mg Intravenous Q12H  . insulin aspart  0-9 Units Subcutaneous TID WC  . insulin aspart  3 Units Subcutaneous TID WC  . insulin glargine  25 Units Subcutaneous QHS  . levETIRAcetam  500 mg Oral BID  . lidocaine  2 patch Transdermal Q24H  . LORazepam  2 mg Intravenous Once  . midodrine  7.5 mg Oral BID WC  . oxymetazoline  1 spray Each Nare Q6H  . pantoprazole  40 mg Oral BID AC  . polyethylene glycol  17 g Oral Daily  . Ensure Max Protein  11 oz Oral BID BM  . senna-docusate  2 tablet Oral BID  . sodium chloride  2 spray Each Nare Q1H while awake  . sucralfate  1 g Oral TID WC & HS   Continuous Infusions: .  ceFAZolin (ANCEF) IV  Stopped (03/23/20 0546)    Antimicrobials: Anti-infectives (From admission, onward)   Start     Dose/Rate Route Frequency Ordered Stop   03/20/20 1600  ceFAZolin (ANCEF) IVPB 2g/100 mL premix        2 g 200 mL/hr over 30 Minutes Intravenous Every 8 hours 03/20/20 1505     03/20/20 1000  acyclovir (ZOVIRAX) 200 MG capsule 400 mg        400 mg Oral Daily 03/11/2020 2217     03/20/20 1000  fluconazole (DIFLUCAN) tablet 100 mg        100 mg Oral Daily 03/10/2020 2217 03/23/20 0959   03/20/20 0800  vancomycin (VANCOREADY) IVPB 750 mg/150 mL  Status:  Discontinued        750 mg 150 mL/hr over 60 Minutes Intravenous Every 12 hours 03/17/2020 2217 03/20/20 1452   03/20/20 0800  ceFEPIme (MAXIPIME) 2 g in sodium chloride 0.9 % 100 mL IVPB  Status:  Discontinued        2 g 200 mL/hr over 30 Minutes Intravenous Every 8 hours 03/20/20 0001 03/20/20 1452   03/20/20 0015  ceFEPIme (MAXIPIME) 2 g in sodium chloride 0.9 % 100 mL IVPB        2 g 200 mL/hr over 30 Minutes Intravenous  Once 03/20/20 0001 03/20/20 0159   03/20/2020 2230  vancomycin (VANCOREADY) IVPB 1250 mg/250 mL  Status:   Discontinued        1,250 mg 166.7 mL/hr over 90 Minutes Intravenous  Once 03/14/2020 2217 03/15/2020 2225   04/07/2020 2224  piperacillin-tazobactam (ZOSYN) IVPB 3.375 g  Status:  Discontinued        3.375 g 12.5 mL/hr over 240 Minutes Intravenous Every 8 hours 03/31/2020 2217 03/20/20 0000     Objective: Vitals: Today's Vitals   03/23/20 0400 03/23/20 0500 03/23/20 0600 03/23/20 0736  BP: 110/67 120/70 112/70   Pulse: 69 73 80   Resp: _0 Temp: 97.7 F (36.5 C)   97.6 F (36.4 C)  TempSrc: Axillary   Oral  SpO2: 100% 98% 100%   Weight:      PainSc: Asleep       Intake/Output Summary (Last 24 hours) at 03/23/2020 1109 Last data filed at 03/23/2020 0600 Gross per 24 hour  Intake 1341.15 ml  Output 2450 ml  Net -1108.85 ml   Filed Weights   03/22/20 0500  Weight: 57.2 kg   Weight change:   Intake/Output from previous day: 09/14 0701 - 09/15 0700 In: 1533.5 [P.O.:120; I.V.:1102.9; IV Piggyback:310.6] Out: 2450 [Urine:2450] Intake/Output this shift: No intake/output data recorded.  Examination: General exam: AAOx3. Weak, NAD, weak appearing. HEENT:Oral mucosa moist, Ear/Nose WNL grossly, dentition normal. Old dry blood on lips Respiratory system: bilaterally clear,no wheezing or crackles,no use of accessory muscle Cardiovascular system: S1 & S2 +, No JVD,. Gastrointestinal system: Abdomen soft, NT,ND, BS+ Nervous System:Alert, awake, moving extremities but weak on left leg more than Rt, Extremities: No edema, distal peripheral pulses palpable.  Skin: No rashes,no icterus. MSK: Normal muscle bulk,tone, power  Data Reviewed: I have personally reviewed following labs and imaging studies CBC: Recent Labs  Lab 03/20/20 0040 03/20/20 0040 03/20/20 1134 03/21/20 1359 03/22/20 0101 03/22/20 2045 03/23/20 0607  WBC 4.4  --   --  2.5* 3.3* 3.5* 3.4*  NEUTROABS 3.5  --   --  2.2 3.1 2.9 2.8  HGB 9.4*  --   --  7.4* 7.4* 7.0* 7.8*  HCT 26.1*  --   --  21.2* 21.0*  20.3* 23.2*  MCV 83.1  --   --  83.1 84.0 84.2 86.2  PLT 38*   < > 28* 25* 40* 38* 41*   < > = values in this interval not displayed.   Basic Metabolic Panel: Recent Labs  Lab 03/21/20 1359 03/21/20 1922 03/21/20 2259 03/22/20 0101 03/23/20 0607  NA 123* 122* 124* 125* 129*  K 3.6 3.6 3.5 3.4* 4.0  CL 96* 96* 97* 97* 102  CO2 16* 15* 18* 19* 19*  GLUCOSE 219* 263* 202* 195* 266*  BUN 40* 44* 41* 40* 40*  CREATININE 1.05 1.13 1.06 1.02 0.94  CALCIUM 8.0* 8.1* 8.0* 8.1* 8.2*  MG  --   --   --   --  1.7  PHOS  --   --   --   --  2.7   GFR: Estimated Creatinine Clearance: 70.6 mL/min (by C-G formula based on SCr of 0.94 mg/dL). Liver Function Tests: Recent Labs  Lab 03/18/2020 1802 03/22/20 0101 03/23/20 0607  AST 38 35 45*  ALT _0 ALKPHOS 94 73 70  BILITOT 0.6 0.5 0.2*  PROT 6.3* 5.6* 5.7*  ALBUMIN 1.9* 1.4* 1.5*   No results for input(s): LIPASE, AMYLASE in the last 168 hours. Recent Labs  Lab 03/22/20 0423  AMMONIA 45*   Coagulation Profile: Recent Labs  Lab 03/20/20 0201 03/20/20 1134  INR 1.4* 1.5*   Cardiac Enzymes: No results for input(s): CKTOTAL, CKMB, CKMBINDEX, TROPONINI in the last 168 hours. BNP (last 3 results) No results for input(s): PROBNP in the last 8760 hours. HbA1C: No results for input(s): HGBA1C in the last 72 hours. CBG: Recent Labs  Lab 03/22/20 0733 03/22/20 1231 03/22/20 1528 03/23/20 0735 03/23/20 1106  GLUCAP 216* 250* 305* 231* 303*   Lipid Profile: No results for input(s): CHOL, HDL, LDLCALC, TRIG, CHOLHDL, LDLDIRECT in the last 72 hours. Thyroid Function Tests: Recent Labs    03/21/20 0240  TSH 0.448   Anemia Panel: No results for input(s): VITAMINB12, FOLATE, FERRITIN, TIBC, IRON, RETICCTPCT in the last 72 hours. Sepsis Labs: Recent Labs  Lab 03/20/20 0040 03/22/20 0101 03/22/20 0423  LATICACIDVEN 1.4 1.4 1.2    Recent Results (from the past 240 hour(s))  Culture, Urine     Status: Abnormal    Collection Time: 03/27/2020  2:28 PM   Specimen: Urine, Random  Result Value Ref Range Status   Specimen Description URINE, RANDOM  Final   Special Requests NONE  Final   Culture (A)  Final    <10,000 COLONIES/mL INSIGNIFICANT GROWTH Performed at [facility], [place] [address] [place], [place] [address]    Report Status 03/20/2020 FINAL  Final  Culture, blood (routine x 2)     Status: Abnormal   Collection Time: 04/01/2020  6:02 PM   Specimen: BLOOD  Result Value Ref Range Status   Specimen Description BLOOD RIGHT ANTECUBITAL  Final   Special Requests   Final    BOTTLES DRAWN AEROBIC AND ANAEROBIC Blood Culture adequate volume   Culture  Setup Time   Final    GRAM POSITIVE COCCI IN CLUSTERS IN BOTH AEROBIC AND ANAEROBIC BOTTLES Organism ID to follow CRITICAL RESULT CALLED TO, READ BACK BY AND VERIFIED WITH: Barth Kirks PHARMD, AT [facility] 03/20/20 BY Rush Landmark Performed at [facility], [place] [address] [place], [place] [address]    Culture STAPHYLOCOCCUS AUREUS (A)  Final   Report Status 03/22/2020 FINAL  Final  Organism ID, Bacteria STAPHYLOCOCCUS AUREUS  Final      Susceptibility   Staphylococcus aureus - MIC*    CIPROFLOXACIN <=0.5 SENSITIVE Sensitive     ERYTHROMYCIN <=0.25 SENSITIVE Sensitive     GENTAMICIN <=0.5 SENSITIVE Sensitive     OXACILLIN 0.5 SENSITIVE Sensitive     TETRACYCLINE <=1 SENSITIVE Sensitive     VANCOMYCIN 1 SENSITIVE Sensitive     TRIMETH/SULFA <=10 SENSITIVE Sensitive     CLINDAMYCIN <=0.25 SENSITIVE Sensitive     RIFAMPIN <=0.5 SENSITIVE Sensitive     Inducible Clindamycin NEGATIVE Sensitive     * STAPHYLOCOCCUS AUREUS  Blood Culture ID Panel (Reflexed)     Status: Abnormal   Collection Time: 04/03/2020  6:02 PM  Result Value Ref Range Status   Enterococcus faecalis NOT DETECTED NOT DETECTED Final   Enterococcus Faecium NOT DETECTED NOT DETECTED Final   Listeria monocytogenes NOT DETECTED NOT DETECTED Final   Staphylococcus species DETECTED  (A) NOT DETECTED Final    Comment: CRITICAL RESULT CALLED TO, READ BACK BY AND VERIFIED WITH: Barth Kirks PHARMD, AT 0740 03/20/20 BY D. VANHOOK    Staphylococcus aureus (BCID) DETECTED (A) NOT DETECTED Final    Comment: CRITICAL RESULT CALLED TO, READ BACK BY AND VERIFIED WITH: Barth Kirks PHARMD, AT 0740 03/20/20 BY D. VANHOOK    Staphylococcus epidermidis NOT DETECTED NOT DETECTED Final   Staphylococcus lugdunensis NOT DETECTED NOT DETECTED Final   Streptococcus species NOT DETECTED NOT DETECTED Final   Streptococcus agalactiae NOT DETECTED NOT DETECTED Final   Streptococcus pneumoniae NOT DETECTED NOT DETECTED Final   Streptococcus pyogenes NOT DETECTED NOT DETECTED Final   A.calcoaceticus-baumannii NOT DETECTED NOT DETECTED Final   Bacteroides fragilis NOT DETECTED NOT DETECTED Final   Enterobacterales NOT DETECTED NOT DETECTED Final   Enterobacter cloacae complex NOT DETECTED NOT DETECTED Final   Escherichia coli NOT DETECTED NOT DETECTED Final   Klebsiella aerogenes NOT DETECTED NOT DETECTED Final   Klebsiella oxytoca NOT DETECTED NOT DETECTED Final   Klebsiella pneumoniae NOT DETECTED NOT DETECTED Final   Proteus species NOT DETECTED NOT DETECTED Final   Salmonella species NOT DETECTED NOT DETECTED Final   Serratia marcescens NOT DETECTED NOT DETECTED Final   Haemophilus influenzae NOT DETECTED NOT DETECTED Final   Neisseria meningitidis NOT DETECTED NOT DETECTED Final   Pseudomonas aeruginosa NOT DETECTED NOT DETECTED Final   Stenotrophomonas maltophilia NOT DETECTED NOT DETECTED Final   Candida albicans NOT DETECTED NOT DETECTED Final   Candida auris NOT DETECTED NOT DETECTED Final   Candida glabrata NOT DETECTED NOT DETECTED Final   Candida krusei NOT DETECTED NOT DETECTED Final   Candida parapsilosis NOT DETECTED NOT DETECTED Final   Candida tropicalis NOT DETECTED NOT DETECTED Final   Cryptococcus neoformans/gattii NOT DETECTED NOT DETECTED Final   Meth resistant mecA/C  and MREJ NOT DETECTED NOT DETECTED Final    Comment: Performed at [facility], [address], [place], [place] [address]  SARS Coronavirus 2 by RT PCR (hospital order, performed in [facility] hospital lab) Nasopharyngeal Nasopharyngeal Swab     Status: None   Collection Time: 03/24/2020  8:37 PM   Specimen: Nasopharyngeal Swab  Result Value Ref Range Status   SARS Coronavirus 2 NEGATIVE NEGATIVE Final    Comment: (NOTE) SARS-CoV-2 target nucleic acids are NOT DETECTED.  The SARS-CoV-2 RNA is generally detectable in upper and lower respiratory specimens during the acute phase of infection. The lowest concentration of SARS-CoV-2 viral copies this assay can detect  is 250 copies / mL. A negative result does not preclude SARS-CoV-2 infection and should not be used as the sole basis for treatment or other patient management decisions.  A negative result may occur with improper specimen collection / handling, submission of specimen other than nasopharyngeal swab, presence of viral mutation(s) within the areas targeted by this assay, and inadequate number of viral copies (<250 copies / mL). A negative result must be combined with clinical observations, patient history, and epidemiological information.  Fact Sheet for Patients:   StrictlyIdeas.no  Fact Sheet for Healthcare Providers: BankingDealers.co.za  This test is not yet approved or  cleared by the [place] FDA and has been authorized for detection and/or diagnosis of SARS-CoV-2 by FDA under an Emergency Use Authorization (EUA).  This EUA will remain in effect (meaning this test can be used) for the duration of the COVID-19 declaration under Section 564(b)(1) of the Act, 21 U.S.C. section 360bbb-3(b)(1), unless the authorization is terminated or revoked sooner.  Performed at [facility], [place] [address], [place], [place] [address]   MRSA PCR Screening     Status: None     Collection Time: 03/20/20  4:21 AM   Specimen: Nasopharyngeal  Result Value Ref Range Status   MRSA by PCR NEGATIVE NEGATIVE Final    Comment:        The GeneXpert MRSA Assay (FDA approved for NASAL specimens only), is one component of a comprehensive MRSA colonization surveillance program. It is not intended to diagnose MRSA infection nor to guide or monitor treatment for MRSA infections. Performed at [facility], [place] [address], [place], [place] [address]   Respiratory Panel by PCR     Status: None   Collection Time: 03/20/20  4:21 AM   Specimen: Nasopharyngeal Swab; Respiratory  Result Value Ref Range Status   Adenovirus NOT DETECTED NOT DETECTED Final   Coronavirus 229E NOT DETECTED NOT DETECTED Final    Comment: (NOTE) The Coronavirus on the Respiratory Panel, DOES NOT test for the novel  Coronavirus (2019 nCoV)    Coronavirus HKU1 NOT DETECTED NOT DETECTED Final   Coronavirus NL63 NOT DETECTED NOT DETECTED Final   Coronavirus OC43 NOT DETECTED NOT DETECTED Final   Metapneumovirus NOT DETECTED NOT DETECTED Final   Rhinovirus / Enterovirus NOT DETECTED NOT DETECTED Final   Influenza A NOT DETECTED NOT DETECTED Final   Influenza B NOT DETECTED NOT DETECTED Final   Parainfluenza Virus 1 NOT DETECTED NOT DETECTED Final   Parainfluenza Virus 2 NOT DETECTED NOT DETECTED Final   Parainfluenza Virus 3 NOT DETECTED NOT DETECTED Final   Parainfluenza Virus 4 NOT DETECTED NOT DETECTED Final   Respiratory Syncytial Virus NOT DETECTED NOT DETECTED Final   Bordetella pertussis NOT DETECTED NOT DETECTED Final   Chlamydophila pneumoniae NOT DETECTED NOT DETECTED Final   Mycoplasma pneumoniae NOT DETECTED NOT DETECTED Final    Comment: Performed at [facility], [place]. [address] [place], [place] [address]  Culture, blood (Routine X 2) w Reflex to ID Panel     Status: None (Preliminary result)   Collection Time: 03/21/20  1:59 PM   Specimen: BLOOD RIGHT HAND  Result  Value Ref Range Status   Specimen Description BLOOD RIGHT HAND  Final   Special Requests   Final    BOTTLES DRAWN AEROBIC AND ANAEROBIC Blood Culture results may not be optimal due to an excessive volume of blood received in culture bottles   Culture   Final  NO GROWTH < 24 HOURS Performed at [place] [address] [place], [place] [address]    Report Status PENDING  Incomplete  Culture, blood (Routine X 2) w Reflex to ID Panel     Status: None (Preliminary result)   Collection Time: 03/21/20  1:59 PM   Specimen: BLOOD LEFT HAND  Result Value Ref Range Status   Specimen Description BLOOD LEFT HAND  Final   Special Requests   Final    BOTTLES DRAWN AEROBIC AND ANAEROBIC Blood Culture results may not be optimal due to an excessive volume of blood received in culture bottles   Culture   Final    NO GROWTH < 24 HOURS Performed at [facility], [place] [address] [place], [place] [address]    Report Status PENDING  Incomplete     Radiology Studies: EEG  Result Date: 03/22/2020 Lora Havens, MD     03/22/2020  1:52 PM Patient Name: Trevor Shelton MRN: [id number] Epilepsy Attending: Lora Havens Referring Physician/Provider: Dr Karena Addison Aroor Date: 03/22/2020 Duration: 23.25 mins Patient history: 54yo m with AMS. CTH showed right frontal mass concerning for mets. EEG to evaluate for seizure. Level of alertness: awake AEDs during EEG study: LEV Technical aspects: This EEG study was done with scalp electrodes positioned according to the 10-20 International system of electrode placement. Electrical activity was acquired at a sampling rate of _0  and reviewed with a high frequency filter of _1  and a low frequency filter of _2 . EEG data were recorded continuously and digitally stored. Description: No posterior dominant rhythm was seen. EEG showed continuous generalized 3 to 6 Hz theta-delta slowing. Hyperventilation and photic stimulation were not performed.   ABNORMALITY  -Continuous slow, generalized IMPRESSION: This study is suggestive of moderate diffuse encephalopathy, nonspecific etiology. No seizures or epileptiform discharges were seen throughout the recording. Lora Havens   CT HEAD WO CONTRAST  Result Date: 03/22/2020 CLINICAL DATA:  Headache EXAM: CT HEAD WITHOUT CONTRAST TECHNIQUE: Contiguous axial images were obtained from the base of the skull through the vertex without intravenous contrast. COMPARISON:  Brain MRI 03/20/2020 FINDINGS: Brain: Trace extra-axial hemorrhage beneath the right frontal lobe remains faintly visible. No new site of hemorrhage. No midline shift or other mass effect. Vascular: No hyperdense vessel or unexpected calcification. Skull: Numerous lucent lesions throughout the calvarium and skull base. The largest lesion is at the left clivus. Sinuses/Orbits: No acute finding. Other: None. IMPRESSION: 1. Trace extra-axial hemorrhage beneath the right frontal lobe remains faintly visible. No new site of hemorrhage. 2. Numerous lucent lesions throughout the calvarium and skull base, consistent with known multiple myeloma. Electronically Signed   By: Ulyses Jarred M.D.   On: 03/22/2020 00:12   DG Chest Port 1 View  Result Date: 03/23/2020 CLINICAL DATA:  Difficulty breathing EXAM: PORTABLE CHEST 1 VIEW COMPARISON:  03/23/2019 FINDINGS: Cardiac shadow is mildly prominent but stable. Patchy airspace opacity is again noted bilaterally but improved when compared with the prior exam. No sizable effusion or pneumothorax is noted. No bony abnormality is seen. IMPRESSION: Improving aeration bilaterally. Electronically Signed   By: Inez Catalina M.D.   On: 03/23/2020 07:37   DG CHEST PORT 1 VIEW  Result Date: 03/22/2020 CLINICAL DATA:  Rapid response EXAM: PORTABLE CHEST 1 VIEW COMPARISON:  03/21/2020, 03/20/2020, 02/25/2020 FINDINGS: Mildly diminished lung volumes. Mild cardiomegaly. Left greater than right airspace disease. No pneumothorax.  IMPRESSION: Patchy left greater than right airspace disease concerning for pneumonia. Findings appear  slightly worsened. Electronically Signed   By: Donavan Foil M.D.   On: 03/22/2020 00:47     LOS: 4 days   Antonieta Pert, MD [facility]  03/23/2020, 11:09 AM

## 2020-03-23 NOTE — Progress Notes (Signed)
Patient ID: Trevor Shelton, male   DOB: 1965-12-04, 54 y.o.   MRN: [id number]          [facility]    Date of Admission:  04/04/2020   Total days of antibiotics 5        Day 4 cefazolin  Trevor Shelton has hospital-acquired MSSA bacteremia.  He defervesced quickly after antibiotics were started.  Blood cultures remain negative at 48 hours.  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  He will undergo a TEE tomorrow to determine optimal duration of IV cefazolin.         Michel Bickers, MD [facility] [phone] pager   [phone] cell 03/23/2020, 2:08 PM

## 2020-03-23 NOTE — Progress Notes (Signed)
STROKE TEAM PROGRESS NOTE      INTERVAL HISTORY .  Patient is sitting up in bed with his wife at the bedside.  Spanish-speaking nurse interprets for him.  He remains neurologically unchanged.  He complains of feeling tired not having enough energy.  He denies headache.  No more seizures.  Vital signs are stable.  His neurological exam is unchanged from yesterday.  Platelet counts are up to 41,000 today.  Hematocrit is 23.2. EEG yesterday showed moderate diffuse encephalopathy which is nonspecific no definite epileptiform activity  OBJECTIVE Vitals:   03/23/20 0500 03/23/20 0600 03/23/20 0736 03/23/20 1109  BP: 120/70 112/70    Pulse: 73 80    Resp: 14 14    Temp:   97.6 F (36.4 C) 98.7 F (37.1 C)  TempSrc:   Oral Oral  SpO2: 98% 100%    Weight:        CBC:  Recent Labs  Lab 03/22/20 2045 03/23/20 0607  WBC 3.5* 3.4*  NEUTROABS 2.9 2.8  HGB 7.0* 7.8*  HCT 20.3* 23.2*  MCV 84.2 86.2  PLT 38* 41*    Basic Metabolic Panel:  Recent Labs  Lab 03/22/20 0101 03/23/20 0607  NA 125* 129*  K 3.4* 4.0  CL 97* 102  CO2 19* 19*  GLUCOSE 195* 266*  BUN 40* 40*  CREATININE 1.02 0.94  CALCIUM 8.1* 8.2*  MG  --  1.7  PHOS  --  2.7    Lipid Panel:     Component Value Date/Time   CHOL 175 02/25/2020 0340   TRIG 142 02/25/2020 0340   HDL 38 (L) 02/25/2020 0340   CHOLHDL 4.6 02/25/2020 0340   VLDL 28 02/25/2020 0340   LDLCALC 109 (H) 02/25/2020 0340   HgbA1c:  Lab Results  Component Value Date   HGBA1C 6.5 (H) 02/18/2020   Urine Drug Screen: No results found for: LABOPIA, COCAINSCRNUR, LABBENZ, AMPHETMU, THCU, LABBARB  Alcohol Level No results found for: [facility]  IMAGING  DG Chest 2 View 04/03/2020 IMPRESSION:  Areas of patchy atelectasis bilaterally. No edema or airspace opacity. Cardiac silhouette within normal limits.   CT HEAD WO CONTRAST 03/20/2020 IMPRESSION:  1. Small lobulated hyperdensity immediately subjacent to the right frontal calvarium as above.  Highly suspicious for a small bleed. No significant mass effect or edema.  2. Multiple scattered lucent lesions throughout the calvarium and visualized upper cervical spine, consistent with history of multiple myeloma.  3. No other acute intracranial abnormality.   Repeat CT HEAD WO CONTRAST 03/21/2020-trace extra-axial hemorrhage in the right frontal lobe no new site of hemorrhage.   MR BRAIN W WO CONTRAST 03/20/2020 IMPRESSION:  1. Acute intracranial hemorrhage involving the anterior-inferior right frontal convexity, stable from prior head CT. No associated edema or significant mass effect.  2. No other acute intracranial abnormality.  3. Innumerable marrow replacing lesions throughout the calvarium, skull base, and visualized upper spine, compatible with history of multiple myeloma.   CT CHEST ABDOMEN PELVIS W CONTRAST 03/20/2020 IMPRESSION:  1. Mild to moderate severity bilateral areas of atelectasis and/or infiltrate.  2. 5.2 cm x 1.5 cm pleural based soft tissue mass along the anterior aspect of the left upper lobe. A 1.4 cm pleural based soft tissue nodule is seen within this region on the prior study. These findings are concerning for the presence of a primary lung malignancy. Further evaluation with a nuclear medicine PET/CT is recommended.  3. Findings consistent with multiple myeloma versus diffuse osseous metastasis, with chronic pathologic  fractures at the levels of T6 and T9 vertebral bodies.  4. Markedly enlarged prostate gland.  5. 4.0 cm x 3.2 cm fat-containing right scrotal hernia.  6. Mild cardiomegaly.  7. Small hiatal hernia.  8. Aortic atherosclerosis.  9. Small simple cyst within the left kidney.  Aortic Atherosclerosis (ICD10-I70.0).   DG Abd Portable 1V 03/30/2020 IMPRESSION:  1. Nonspecific bowel gas pattern with gaseous distention of loops of small bowel and colon scattered throughout the abdomen.  2. Above average amount of stool throughout the ascending colon.    Bilateral Lower Extremity Venous Dopplers - 03/03/20 - negative for DVT  ECG - ST rate 126 BPM. (See cardiology reading for complete details)  PHYSICAL EXAM Blood pressure 112/70, pulse 80, temperature 98.7 F (37.1 C), temperature source Oral, resp. rate 14, weight 57.2 kg, SpO2 100 %. Mildly obese middle-aged Hispanic male not in distress. . Afebrile. Head is nontraumatic. Neck is supple without bruit.    Cardiac exam no murmur or gallop. Lungs are clear to auscultation. Distal pulses are well felt.  There is mild healed blood over both upper and lower lips. Right shoulder elevation limited due to pain Neurological Exam:   He is awake alert seems oriented to time place and person.  Speech appears clear without dysarthria.  Extraocular movements are full range without nystagmus.  He follows simple midline and one-step commands well.  No facial weakness.  Tongue midline. Motor system exam able to move all 4 extremities equally well against gravity without focal weakness.  Shoulder elevation limited due to pain on the right.  Sensation intact bilaterally.  Reflexes symmetric.  Plantars downgoing.  Gait not tested. ASSESSMENT/PLAN Mr. Dempsy Damiano is a 54 y.o. male with history of hypertension, AKI, DM, hypercalcemia, and hyperlipidemia recently diagnosed with metastatic multiple myeloma complicated by lytic lesions of the spine, as well as other painful lesions, treated with radiation of spine lesions due to concern for impending cord compression and CyBorD chemotherapy with prophylactic acyclovir, new possible primary lung malignancy, recent epistaxis requiring transfusion for pancytopenia, a head CT performed for AMS demonstrated a lesion in the right frontal lobe concerning for possible hemorrhage prompting a neurology consult. Recent symptoms include blurred vision, lower extremity weakness, incontinence of urine more than bowel, significant shoulder, back, and leg pain, as well as decreased  sensation in the sacral/genital region. He did not receive IV t-PA due to hemorrhage.   Stroke: Acute intracranial hemorrhage involving the anterior-inferior right frontal convexity-likely due to thrombocytopenia from effect of multiple myeloma and chemotherapy for its treatment.-Doubt this represents hypertensive, hemorrhagic infarct or septic emboli  Resultant no focal neuro deficits  Code Stroke CT Head - not ordered  CT head - Small lobulated hyperdensity immediately subjacent to the right frontal calvarium as above. Highly suspicious for a small bleed.  CT Head WO -decreased size of right frontal anteroinferior convexity hemorrhage.    MRI head - Acute intracranial hemorrhage involving the anterior-inferior right frontal convexity, stable from prior head CT.   MRA head - not ordered  CTA H&N - not ordered  CT Perfusion - not ordered  CT Chest Abd and Pelvus - 5.2 cm x 1.5 cm pleural based soft tissue mass along the anterior aspect of the left upper lobe. A 1.4 cm pleural based soft tissue nodule is seen within this region on the prior study. These findings are concerning for the presence of a primary lung malignancy.  Carotid Doppler - not indicated  2D Echo - not indicated  Sars Corona Virus 2 - negative  LDL - 109  HgbA1c - 6.5  UDS - not ordered  VTE prophylaxis - SCDs  EEG 03/22/2020 moderate diffuse encephalopathy.  No seizures Diet  Diet Order            Diet Carb Modified Fluid consistency: Thin; Room service appropriate? Yes  Diet effective now                 No antithrombotic prior to admission, now on No antithrombotic due to thrombocytopenia  Ongoing aggressive stroke risk factor management  Therapy recommendations:  pending  Disposition:  Pending  Hypertension  Home BP meds: Norvasc ; Coreg  Current BP meds: Coreg  Stable  SBP goal < 140 mm Hg initially . Long-term BP goal normotensive  Hyperlipidemia  Home Lipid lowering  medication: Zocor 20 mg daily  LDL 109, goal < 70  Current lipid lowering medication: Lipitor 20 mg daily -> will D/C - contraindicated in [place]  Continue statin at discharge  Diabetes (Decadron)  Home diabetic meds: insulin  Current diabetic meds: insulin  HgbA1c 6.5, goal < 7.0 Recent Labs    03/22/20 1528 03/23/20 0735 03/23/20 1106  GLUCAP 305* 231* 303*    Other Stroke Risk Factors  Previous ETOH use  Previous drug use  Other Active Problems  Code status - Full code  Aortic Atherosclerosis (ICD10-I70.0)   AKI - creatinine - 127->124  Anemia - Hgb - 9.4 Thrombocytopenia - platelets - 38 Hyponatremia - Na - 121 Hypocalcemia - 7.9 Tachycardia - 115 - 143 - likely due to fever Fever - 102.3 - Maxipime ; Vancomycin ; Diflucan Multiple Myeloma Possible primary lung malignancy NPO - tube feedings  Hospital day # 4 Presented with confusion disorientation without any other focal deficits and CT scan and subsequently MRI confirmed small 1 cm right frontal parenchymal hemorrhage with slight subdural extension.  Etiology is likely related to severe thrombocytopenia which is a result of multiple myeloma and its resultant chemotherapy side effect or possibly septic emboli from endocarditis given new finding of MSSA bacteremia..  . Patient may benefit with TEE to look for endocarditis and cardiology has been consulted.  Continue Keppra 500 milligrams twice daily for seizure prophylaxis  .  Long discussion with Dr. Maren Beach as well as with patient and his wife using Spanish language  RN interpreter and answered questions.     Greater than 50% time during this 25-minute visit was spent on counseling and coordination of care about his intracerebral hemorrhage and thrombocytopenia and answering questions.  Long discussion with patient and wife via [place] language video interpreter and patient's RN and answered questions.    Antony Contras, MD To contact Stroke Continuity provider,  please refer to [email or web address] After hours, contact General Neurology

## 2020-03-23 NOTE — Progress Notes (Signed)
° ° °  [facility] has been requested to perform a transesophageal echocardiogram on Mr. Baugh for Bacteremia.  After careful review of history and examination, the risks and benefits of transesophageal echocardiogram have been explained including risks of esophageal damage, perforation (1:10,000 risk), bleeding, pharyngeal hematoma as well as other potential complications associated with conscious sedation including aspiration, arrhythmia, respiratory failure and death. Alternatives to treatment were discussed, questions were answered. Patient is willing to proceed.  TEE - Dr.Ross 03/24/2020 @ 10:30am. NPO after midnight. Meds with sips.   Leanor Kail, PA-C 03/23/2020 1:47 PM

## 2020-03-24 ENCOUNTER — Inpatient Hospital Stay (HOSPITAL_COMMUNITY): Payer: Self-pay

## 2020-03-24 DIAGNOSIS — R410 Disorientation, unspecified: Secondary | ICD-10-CM

## 2020-03-24 LAB — CBC WITH DIFFERENTIAL/PLATELET
Abs Immature Granulocytes: 0.2 10*3/uL — ABNORMAL HIGH (ref 0.00–0.07)
Basophils Absolute: 0 10*3/uL (ref 0.0–0.1)
Basophils Relative: 0 %
Eosinophils Absolute: 0 10*3/uL (ref 0.0–0.5)
Eosinophils Relative: 0 %
HCT: 25.7 % — ABNORMAL LOW (ref 39.0–52.0)
Hemoglobin: 8.9 g/dL — ABNORMAL LOW (ref 13.0–17.0)
Lymphocytes Relative: 7 %
Lymphs Abs: 0.4 10*3/uL — ABNORMAL LOW (ref 0.7–4.0)
MCH: 29.5 pg (ref 26.0–34.0)
MCHC: 34.6 g/dL (ref 30.0–36.0)
MCV: 85.1 fL (ref 80.0–100.0)
Metamyelocytes Relative: 2 %
Monocytes Absolute: 0 10*3/uL — ABNORMAL LOW (ref 0.1–1.0)
Monocytes Relative: 0 %
Neutro Abs: 4.6 10*3/uL (ref 1.7–7.7)
Neutrophils Relative %: 90 %
Platelets: 50 10*3/uL — ABNORMAL LOW (ref 150–400)
Promyelocytes Relative: 1 %
RBC: 3.02 MIL/uL — ABNORMAL LOW (ref 4.22–5.81)
RDW: 13.5 % (ref 11.5–15.5)
WBC: 5.1 10*3/uL (ref 4.0–10.5)
nRBC: 2 % — ABNORMAL HIGH (ref 0.0–0.2)
nRBC: 2 /100 WBC — ABNORMAL HIGH

## 2020-03-24 LAB — COMPREHENSIVE METABOLIC PANEL
ALT: 18 U/L (ref 0–44)
AST: 29 U/L (ref 15–41)
Albumin: 1.6 g/dL — ABNORMAL LOW (ref 3.5–5.0)
Alkaline Phosphatase: 78 U/L (ref 38–126)
Anion gap: 12 (ref 5–15)
BUN: 38 mg/dL — ABNORMAL HIGH (ref 6–20)
CO2: 16 mmol/L — ABNORMAL LOW (ref 22–32)
Calcium: 8.6 mg/dL — ABNORMAL LOW (ref 8.9–10.3)
Chloride: 102 mmol/L (ref 98–111)
Creatinine, Ser: 1.12 mg/dL (ref 0.61–1.24)
GFR calc Af Amer: >60 mL/min (ref >60)
GFR calc non Af Amer: >60 mL/min (ref >60)
Glucose, Bld: 241 mg/dL — ABNORMAL HIGH (ref 70–99)
Potassium: 3.8 mmol/L (ref 3.5–5.1)
Sodium: 130 mmol/L — ABNORMAL LOW (ref 135–145)
Total Bilirubin: 0.4 mg/dL (ref 0.3–1.2)
Total Protein: 5.7 g/dL — ABNORMAL LOW (ref 6.5–8.1)

## 2020-03-24 LAB — GLUCOSE, CAPILLARY
Glucose-Capillary: 274 mg/dL — ABNORMAL HIGH (ref 70–99)
Glucose-Capillary: 212 mg/dL — ABNORMAL HIGH (ref 70–99)
Glucose-Capillary: 258 mg/dL — ABNORMAL HIGH (ref 70–99)
Glucose-Capillary: 223 mg/dL — ABNORMAL HIGH (ref 70–99)
Glucose-Capillary: 215 mg/dL — ABNORMAL HIGH (ref 70–99)

## 2020-03-24 MED ORDER — INSULIN GLARGINE 100 UNIT/ML ~~LOC~~ SOLN
15.0000 [IU] | Freq: Every day | SUBCUTANEOUS | Status: DC
  Administered 2020-03-24 – 2020-04-03 (×11): 15 [IU] via SUBCUTANEOUS
  Filled 2020-03-24 (×15): qty 0.15

## 2020-03-24 MED ORDER — OXYMETAZOLINE HCL 0.05 % NA SOLN
1.0000 | Freq: Two times a day (BID) | NASAL | Status: DC | PRN
  Administered 2020-03-26 – 2020-04-04 (×3): 1 via NASAL
  Filled 2020-03-24: qty 30

## 2020-03-24 MED ORDER — LACTATED RINGERS IV SOLN: INTRAVENOUS | Status: DC

## 2020-03-24 MED ORDER — BISACODYL 10 MG RE SUPP
10.0000 mg | Freq: Once | RECTAL | Status: AC
  Administered 2020-03-24: 10 mg via RECTAL

## 2020-03-24 MED ORDER — LORAZEPAM 2 MG/ML IJ SOLN
INTRAMUSCULAR | Status: AC
  Filled 2020-03-24: qty 1

## 2020-03-24 MED ORDER — DEXAMETHASONE SODIUM PHOSPHATE 4 MG/ML IJ SOLN
2.0000 mg | INTRAMUSCULAR | Status: DC
  Administered 2020-03-25 – 2020-04-02 (×5): 2 mg via INTRAVENOUS
  Filled 2020-03-24 (×3): qty 0.5
  Filled 2020-03-24 (×2): qty 1
  Filled 2020-03-24: qty 0.5

## 2020-03-24 MED ORDER — IOHEXOL 9 MG/ML PO SOLN
500.0000 mL | ORAL | Status: AC
  Administered 2020-03-24: 250 mL via ORAL

## 2020-03-24 MED ORDER — QUETIAPINE FUMARATE 25 MG PO TABS
12.5000 mg | ORAL_TABLET | Freq: Every day | ORAL | Status: DC
  Administered 2020-03-24 – 2020-04-05 (×13): 12.5 mg via ORAL
  Filled 2020-03-24 (×14): qty 1

## 2020-03-24 NOTE — Progress Notes (Signed)
[facility]  Date of Admission:  03/18/2020      Total days of antibiotics 3  Day 2 Cefazolin           ASSESSMENT: Trevor Shelton appears uncomfortable today with large distended abdomen that is contributing to his shortness of breath. He was satting in the mid 90s with rates in 23s when I arrived. His nurse helped me put him on a few liters of oxygen for his comfort. He does not tolerate being laid flat at all currently; this in addition to his thrombocytopenia < 50K I don't think is worth the risk of getting TEE for him. Discussed with cardiology. Given he developed bacteremia in the hospital setting and was intervened upon quickly, very unlikely he would have acquired endocarditis in this setting. Also with alternative explaination of bacteremia secondary to pneumonia. Would feel comfortable giving him 2 weeks from his cleared blood cultures. He can get a PICC line   CT abdomen ordered to evaluate his abdomen, which is quite distended. Normal-hyperactive noise. Previous bowel regimen had been on hold recently d/t diarrhea. Unrelated to current infection.   ID will sign off. Happy to come back to see him should something change with his condition or questions surrounding discharge.    PLAN: 1. Continue cefazolin through 9/27 2. OK to place picc line per primary team  3. I called cardiology to cancel TEE      Principal Problem:   MSSA bacteremia Active Problems:   AKI (acute kidney injury) ([place])   Pathologic fracture   Hyponatremia   Multiple myeloma not having achieved remission ([place])   Diabetes mellitus ([place])   Hypertension   Constipation   Incomplete paraplegia ([place])   Pancytopenia ([facility] [place] [facility])   AMS (altered mental status)   Intracerebral hemorrhage   Pneumonia   Delirium   . (feeding supplement) PROSource Plus  30 mL Oral BID BM  . sodium chloride   Intravenous Once  . acyclovir  400 mg Oral Daily  . carvedilol  3.125 mg Oral BID WC    . Chlorhexidine Gluconate Cloth  6 each Topical Daily  . insulin aspart  0-9 Units Subcutaneous TID WC  . insulin aspart  3 Units Subcutaneous TID WC  . insulin glargine  25 Units Subcutaneous QHS  . iohexol  500 mL Oral Q1H  . levETIRAcetam  500 mg Oral BID  . lidocaine  2 patch Transdermal Q24H  . melatonin  3 mg Oral QHS  . midodrine  7.5 mg Oral BID WC  . pantoprazole  40 mg Oral BID AC  . polyethylene glycol  17 g Oral Daily  . Ensure Max Protein  11 oz Oral BID BM  . QUEtiapine  12.5 mg Oral QHS  . senna-docusate  2 tablet Oral BID  . sodium chloride  2 spray Each Nare Q1H while awake  . sucralfate  1 g Oral TID WC & HS    SUBJECTIVE: [name] interpretor phone service used to facilitate visit today with Trevor Shelton and his wife.   He states his "stomach hurts and is very hard." Having difficulty getting "enough air in"    Review of Systems: Review of Systems  Constitutional: Negative for chills and fever.  HENT: Negative for tinnitus.   Eyes: Negative for blurred vision and photophobia.  Respiratory: Positive for shortness of breath. Negative for cough and sputum production.   Cardiovascular: Negative for chest pain.  Gastrointestinal: Positive for  abdominal pain. Negative for diarrhea, nausea and vomiting.  Genitourinary: Negative for dysuria.  Musculoskeletal: Negative for back pain and joint pain.  Skin: Negative for rash.  Neurological: Positive for weakness. Negative for focal weakness and headaches.    Allergies  Allergen Reactions  . Other     pollen  . Penicillins     unknown    OBJECTIVE: Vitals:   03/24/20 0600 03/24/20 0700 03/24/20 0800 03/24/20 1200  BP: (!) 138/95 (!) 140/93    Pulse: (!) 105 (!) 101    Resp: (!) 22 (!) 22    Temp:   99.3 F (37.4 C) 99 F (37.2 C)  TempSrc:   Axillary Oral  SpO2: 97% 96%    Weight:       Body mass index is 22.26 kg/m.  Physical Exam Vitals and nursing note reviewed.  Constitutional:       Appearance: He is well-developed.     Comments: Sitting upright high fowlers. Mild distress   HENT:     Mouth/Throat:     Dentition: Normal dentition. No dental abscesses.  Cardiovascular:     Rate and Rhythm: Normal rate and regular rhythm.     Heart sounds: Normal heart sounds.  Pulmonary:     Comments: Very diminished in bases with shallow inspiratory effort. Clear upper lung fields.  Abdominal:     General: Bowel sounds are increased. There is distension.     Palpations: Abdomen is soft.     Tenderness: There is no abdominal tenderness.  Lymphadenopathy:     Cervical: No cervical adenopathy.  Skin:    General: Skin is warm and dry.     Capillary Refill: Capillary refill takes less than 2 seconds.     Findings: No rash.  Neurological:     Mental Status: He is oriented to person, place, and time.     Lab Results Lab Results  Component Value Date   WBC 5.1 03/24/2020   HGB 8.9 (L) 03/24/2020   HCT 25.7 (L) 03/24/2020   MCV 85.1 03/24/2020   PLT 50 (L) 03/24/2020    Lab Results  Component Value Date   CREATININE 1.12 03/24/2020   BUN 38 (H) 03/24/2020   NA 130 (L) 03/24/2020   K 3.8 03/24/2020   CL 102 03/24/2020   CO2 16 (L) 03/24/2020    Lab Results  Component Value Date   ALT 18 03/24/2020   AST 29 03/24/2020   ALKPHOS 78 03/24/2020   BILITOT 0.4 03/24/2020     Microbiology: Recent Results (from the past 240 hour(s))  Culture, Urine     Status: Abnormal   Collection Time: 04/01/2020  2:28 PM   Specimen: Urine, Random  Result Value Ref Range Status   Specimen Description URINE, RANDOM  Final   Special Requests NONE  Final   Culture (A)  Final    <10,000 COLONIES/mL INSIGNIFICANT GROWTH Performed at [facility], [address] [place], [place] [address]    Report Status 03/20/2020 FINAL  Final  Culture, blood (routine x 2)     Status: Abnormal   Collection Time: 03/15/2020  6:02 PM   Specimen: BLOOD  Result Value Ref Range Status   Specimen  Description BLOOD RIGHT ANTECUBITAL  Final   Special Requests   Final    BOTTLES DRAWN AEROBIC AND ANAEROBIC Blood Culture adequate volume   Culture  Setup Time   Final    GRAM POSITIVE COCCI IN CLUSTERS IN BOTH AEROBIC AND ANAEROBIC  BOTTLES Organism ID to follow CRITICAL RESULT CALLED TO, READ BACK BY AND VERIFIED WITH: Barth Kirks PHARMD, AT 7282 03/20/20 BY Rush Landmark Performed at [facility], [place] [address], [place], [place] [address]    Culture STAPHYLOCOCCUS AUREUS (A)  Final   Report Status 03/22/2020 FINAL  Final   Organism ID, Bacteria STAPHYLOCOCCUS AUREUS  Final      Susceptibility   Staphylococcus aureus - MIC*    CIPROFLOXACIN <=0.5 SENSITIVE Sensitive     ERYTHROMYCIN <=0.25 SENSITIVE Sensitive     GENTAMICIN <=0.5 SENSITIVE Sensitive     OXACILLIN 0.5 SENSITIVE Sensitive     TETRACYCLINE <=1 SENSITIVE Sensitive     VANCOMYCIN 1 SENSITIVE Sensitive     TRIMETH/SULFA <=10 SENSITIVE Sensitive     CLINDAMYCIN <=0.25 SENSITIVE Sensitive     RIFAMPIN <=0.5 SENSITIVE Sensitive     Inducible Clindamycin NEGATIVE Sensitive     * STAPHYLOCOCCUS AUREUS  Blood Culture ID Panel (Reflexed)     Status: Abnormal   Collection Time: 03/29/2020  6:02 PM  Result Value Ref Range Status   Enterococcus faecalis NOT DETECTED NOT DETECTED Final   Enterococcus Faecium NOT DETECTED NOT DETECTED Final   Listeria monocytogenes NOT DETECTED NOT DETECTED Final   Staphylococcus species DETECTED (A) NOT DETECTED Final    Comment: CRITICAL RESULT CALLED TO, READ BACK BY AND VERIFIED WITH: Barth Kirks PHARMD, AT 0740 03/20/20 BY D. VANHOOK    Staphylococcus aureus (BCID) DETECTED (A) NOT DETECTED Final    Comment: CRITICAL RESULT CALLED TO, READ BACK BY AND VERIFIED WITH: Barth Kirks PHARMD, AT 0740 03/20/20 BY D. VANHOOK    Staphylococcus epidermidis NOT DETECTED NOT DETECTED Final   Staphylococcus lugdunensis NOT DETECTED NOT DETECTED Final   Streptococcus species NOT DETECTED NOT DETECTED  Final   Streptococcus agalactiae NOT DETECTED NOT DETECTED Final   Streptococcus pneumoniae NOT DETECTED NOT DETECTED Final   Streptococcus pyogenes NOT DETECTED NOT DETECTED Final   A.calcoaceticus-baumannii NOT DETECTED NOT DETECTED Final   Bacteroides fragilis NOT DETECTED NOT DETECTED Final   Enterobacterales NOT DETECTED NOT DETECTED Final   Enterobacter cloacae complex NOT DETECTED NOT DETECTED Final   Escherichia coli NOT DETECTED NOT DETECTED Final   Klebsiella aerogenes NOT DETECTED NOT DETECTED Final   Klebsiella oxytoca NOT DETECTED NOT DETECTED Final   Klebsiella pneumoniae NOT DETECTED NOT DETECTED Final   Proteus species NOT DETECTED NOT DETECTED Final   Salmonella species NOT DETECTED NOT DETECTED Final   Serratia marcescens NOT DETECTED NOT DETECTED Final   Haemophilus influenzae NOT DETECTED NOT DETECTED Final   Neisseria meningitidis NOT DETECTED NOT DETECTED Final   Pseudomonas aeruginosa NOT DETECTED NOT DETECTED Final   Stenotrophomonas maltophilia NOT DETECTED NOT DETECTED Final   Candida albicans NOT DETECTED NOT DETECTED Final   Candida auris NOT DETECTED NOT DETECTED Final   Candida glabrata NOT DETECTED NOT DETECTED Final   Candida krusei NOT DETECTED NOT DETECTED Final   Candida parapsilosis NOT DETECTED NOT DETECTED Final   Candida tropicalis NOT DETECTED NOT DETECTED Final   Cryptococcus neoformans/gattii NOT DETECTED NOT DETECTED Final   Meth resistant mecA/C and MREJ NOT DETECTED NOT DETECTED Final    Comment: Performed at [facility], [address], [place], [place] [address]  SARS Coronavirus 2 by RT PCR (hospital order, performed in [facility] hospital lab) Nasopharyngeal Nasopharyngeal Swab     Status: None   Collection Time: 03/28/2020  8:37 PM   Specimen: Nasopharyngeal Swab  Result Value Ref Range Status   SARS Coronavirus 2 NEGATIVE NEGATIVE Final    Comment: (NOTE) SARS-CoV-2 target nucleic acids are NOT DETECTED.  The SARS-CoV-2 RNA  is generally detectable in upper and lower respiratory specimens during the acute phase of infection. The lowest concentration of SARS-CoV-2 viral copies this assay can detect is 250 copies / mL. A negative result does not preclude SARS-CoV-2 infection and should not be used as the sole basis for treatment or other patient management decisions.  A negative result may occur with improper specimen collection / handling, submission of specimen other than nasopharyngeal swab, presence of viral mutation(s) within the areas targeted by this assay, and inadequate number of viral copies (<250 copies / mL). A negative result must be combined with clinical observations, patient history, and epidemiological information.  Fact Sheet for Patients:   StrictlyIdeas.no  Fact Sheet for Healthcare Providers: BankingDealers.co.za  This test is not yet approved or  cleared by the [place] FDA and has been authorized for detection and/or diagnosis of SARS-CoV-2 by FDA under an Emergency Use Authorization (EUA).  This EUA will remain in effect (meaning this test can be used) for the duration of the COVID-19 declaration under Section 564(b)(1) of the Act, 21 U.S.C. section 360bbb-3(b)(1), unless the authorization is terminated or revoked sooner.  Performed at [facility], [place] [address], [place], [place] [address]   MRSA PCR Screening     Status: None   Collection Time: 03/20/20  4:21 AM   Specimen: Nasopharyngeal  Result Value Ref Range Status   MRSA by PCR NEGATIVE NEGATIVE Final    Comment:        The GeneXpert MRSA Assay (FDA approved for NASAL specimens only), is one component of a comprehensive MRSA colonization surveillance program. It is not intended to diagnose MRSA infection nor to guide or monitor treatment for MRSA infections. Performed at [facility], [place] [address] [place], [place] [address]   Respiratory Panel by  PCR     Status: None   Collection Time: 03/20/20  4:21 AM   Specimen: Nasopharyngeal Swab; Respiratory  Result Value Ref Range Status   Adenovirus NOT DETECTED NOT DETECTED Final   Coronavirus 229E NOT DETECTED NOT DETECTED Final    Comment: (NOTE) The Coronavirus on the Respiratory Panel, DOES NOT test for the novel  Coronavirus (2019 nCoV)    Coronavirus HKU1 NOT DETECTED NOT DETECTED Final   Coronavirus NL63 NOT DETECTED NOT DETECTED Final   Coronavirus OC43 NOT DETECTED NOT DETECTED Final   Metapneumovirus NOT DETECTED NOT DETECTED Final   Rhinovirus / Enterovirus NOT DETECTED NOT DETECTED Final   Influenza A NOT DETECTED NOT DETECTED Final   Influenza B NOT DETECTED NOT DETECTED Final   Parainfluenza Virus 1 NOT DETECTED NOT DETECTED Final   Parainfluenza Virus 2 NOT DETECTED NOT DETECTED Final   Parainfluenza Virus 3 NOT DETECTED NOT DETECTED Final   Parainfluenza Virus 4 NOT DETECTED NOT DETECTED Final   Respiratory Syncytial Virus NOT DETECTED NOT DETECTED Final   Bordetella pertussis NOT DETECTED NOT DETECTED Final   Chlamydophila pneumoniae NOT DETECTED NOT DETECTED Final   Mycoplasma pneumoniae NOT DETECTED NOT DETECTED Final    Comment: Performed at [facility], [place]. [address] [place], [place] [address]  Culture, blood (Routine X 2) w Reflex to ID Panel     Status: None (Preliminary result)   Collection Time: 03/21/20  1:59 PM   Specimen: BLOOD RIGHT HAND  Result Value Ref Range Status   Specimen Description BLOOD RIGHT HAND  Final   Special Requests   Final    BOTTLES DRAWN AEROBIC AND ANAEROBIC Blood Culture results may not be optimal due to an excessive volume of blood received in culture bottles   Culture   Final    NO GROWTH 2 DAYS Performed at [facility] [address] [place], [place] [address]    Report Status PENDING  Incomplete  Culture, blood (Routine X 2) w Reflex to ID Panel     Status: None (Preliminary result)   Collection Time:  03/21/20  1:59 PM   Specimen: BLOOD LEFT HAND  Result Value Ref Range Status   Specimen Description BLOOD LEFT HAND  Final   Special Requests   Final    BOTTLES DRAWN AEROBIC AND ANAEROBIC Blood Culture results may not be optimal due to an excessive volume of blood received in culture bottles   Culture   Final    NO GROWTH 2 DAYS Performed at [facility], [place] [address], [place], [place] [address]    Report Status PENDING  Incomplete    Janene Madeira, MSN, NP-C [facility] [email or web address] Pager: [phone] Office: [phone] [place]: [phone]

## 2020-03-24 NOTE — Progress Notes (Signed)
STROKE TEAM PROGRESS NOTE      INTERVAL HISTORY .  Patient is sitting up in bed with his wife at the bedside.  Spanish-speaking video interpreter interprets for him.  He had a brief episode of altered mental status this morning.  As per his nurse patient did not sleep well last night.  This morning he was found to be staring and not responding to nurse.  Even the video interpreter was unable to get a response out of him.  He kept on making the sign of cross in front of his chest with his hands and blinking frequently.  There is no focal extremity twitching or tonic-clonic movements noted.  Dr. Leonel Ramsay was called who ordered a stat EEG which showed no ongoing seizure activity.  Patient's condition is responded since then back to his baseline.  He states that since he is seeing some images and objects which are not real.  His sleep-wake cycle is also disturbed.  He remains on Keppra for seizures.  TEE scheduled for later today.  Vital signs are stable  OBJECTIVE Vitals:   03/24/20 0600 03/24/20 0700 03/24/20 0800 03/24/20 1200  BP: (!) 138/95 (!) 140/93    Pulse: (!) 105 (!) 101    Resp: (!) 22 (!) 22    Temp:   99.3 F (37.4 C) 99 F (37.2 C)  TempSrc:   Axillary Oral  SpO2: 97% 96%    Weight:        CBC:  Recent Labs  Lab 03/23/20 0607 03/24/20 1018  WBC 3.4* 5.1  NEUTROABS 2.8 4.6  HGB 7.8* 8.9*  HCT 23.2* 25.7*  MCV 86.2 85.1  PLT 41* 50*    Basic Metabolic Panel:  Recent Labs  Lab 03/23/20 0607 03/24/20 1018  NA 129* 130*  K 4.0 3.8  CL 102 102  CO2 19* 16*  GLUCOSE 266* 241*  BUN 40* 38*  CREATININE 0.94 1.12  CALCIUM 8.2* 8.6*  MG 1.7  --   PHOS 2.7  --     Lipid Panel:     Component Value Date/Time   CHOL 175 02/25/2020 0340   TRIG 142 02/25/2020 0340   HDL 38 (L) 02/25/2020 0340   CHOLHDL 4.6 02/25/2020 0340   VLDL 28 02/25/2020 0340   LDLCALC 109 (H) 02/25/2020 0340   HgbA1c:  Lab Results  Component Value Date   HGBA1C 6.5 (H) 02/18/2020    Urine Drug Screen: No results found for: LABOPIA, COCAINSCRNUR, LABBENZ, AMPHETMU, THCU, LABBARB  Alcohol Level No results found for: [facility]  IMAGING  DG Chest 2 View 03/15/2020 IMPRESSION:  Areas of patchy atelectasis bilaterally. No edema or airspace opacity. Cardiac silhouette within normal limits.   CT HEAD WO CONTRAST 03/20/2020 IMPRESSION:  1. Small lobulated hyperdensity immediately subjacent to the right frontal calvarium as above. Highly suspicious for a small bleed. No significant mass effect or edema.  2. Multiple scattered lucent lesions throughout the calvarium and visualized upper cervical spine, consistent with history of multiple myeloma.  3. No other acute intracranial abnormality.   Repeat CT HEAD WO CONTRAST 03/21/2020-trace extra-axial hemorrhage in the right frontal lobe no new site of hemorrhage.   MR BRAIN W WO CONTRAST 03/20/2020 IMPRESSION:  1. Acute intracranial hemorrhage involving the anterior-inferior right frontal convexity, stable from prior head CT. No associated edema or significant mass effect.  2. No other acute intracranial abnormality.  3. Innumerable marrow replacing lesions throughout the calvarium, skull base, and visualized upper spine, compatible with history of  multiple myeloma.   CT CHEST ABDOMEN PELVIS W CONTRAST 03/20/2020 IMPRESSION:  1. Mild to moderate severity bilateral areas of atelectasis and/or infiltrate.  2. 5.2 cm x 1.5 cm pleural based soft tissue mass along the anterior aspect of the left upper lobe. A 1.4 cm pleural based soft tissue nodule is seen within this region on the prior study. These findings are concerning for the presence of a primary lung malignancy. Further evaluation with a nuclear medicine PET/CT is recommended.  3. Findings consistent with multiple myeloma versus diffuse osseous metastasis, with chronic pathologic fractures at the levels of T6 and T9 vertebral bodies.  4. Markedly enlarged prostate gland.  5. 4.0  cm x 3.2 cm fat-containing right scrotal hernia.  6. Mild cardiomegaly.  7. Small hiatal hernia.  8. Aortic atherosclerosis.  9. Small simple cyst within the left kidney.  Aortic Atherosclerosis (ICD10-I70.0).   DG Abd Portable 1V 03/22/2020 IMPRESSION:  1. Nonspecific bowel gas pattern with gaseous distention of loops of small bowel and colon scattered throughout the abdomen.  2. Above average amount of stool throughout the ascending colon.   Bilateral Lower Extremity Venous Dopplers - 03/03/20 - negative for DVT  ECG - ST rate 126 BPM. (See cardiology reading for complete details)  PHYSICAL EXAM Blood pressure (!) 140/93, pulse (!) 101, temperature 99 F (37.2 C), temperature source Oral, resp. rate (!) 22, weight 57 kg, SpO2 96 %. Mildly obese middle-aged Hispanic male not in distress. . Afebrile. Head is nontraumatic. Neck is supple without bruit.    Cardiac exam no murmur or gallop. Lungs are clear to auscultation. Distal pulses are well felt.  There is mild healed blood over both upper and lower lips. Right shoulder elevation limited due to pain Neurological Exam:   He is awake alert seems oriented to time place and person.  Speech appears clear without dysarthria.  Extraocular movements are full range without nystagmus.  He follows simple midline and one-step commands well.  No facial weakness.  Tongue midline. Motor system exam able to move all 4 extremities equally well against gravity without focal weakness.  Shoulder elevation limited due to pain on the right.  Sensation intact bilaterally.  Reflexes symmetric.  Plantars downgoing.  Gait not tested. ASSESSMENT/PLAN [name] is a 54 y.o. male with history of hypertension, AKI, DM, hypercalcemia, and hyperlipidemia recently diagnosed with metastatic multiple myeloma complicated by lytic lesions of the spine, as well as other painful lesions, treated with radiation of spine lesions due to concern for impending cord compression  and CyBorD chemotherapy with prophylactic acyclovir, new possible primary lung malignancy, recent epistaxis requiring transfusion for pancytopenia, a head CT performed for AMS demonstrated a lesion in the right frontal lobe concerning for possible hemorrhage prompting a neurology consult. Recent symptoms include blurred vision, lower extremity weakness, incontinence of urine more than bowel, significant shoulder, back, and leg pain, as well as decreased sensation in the sacral/genital region. He did not receive IV t-PA due to hemorrhage.   Stroke: Acute intracranial hemorrhage involving the anterior-inferior right frontal convexity-likely due to thrombocytopenia from effect of multiple myeloma and chemotherapy for its treatment.-Doubt this represents hypertensive, hemorrhagic infarct or septic emboli  Resultant no focal neuro deficits  Code Stroke CT Head - not ordered  CT head - Small lobulated hyperdensity immediately subjacent to the right frontal calvarium as above. Highly suspicious for a small bleed.  CT Head WO -decreased size of right frontal anteroinferior convexity hemorrhage.    MRI head -  Acute intracranial hemorrhage involving the anterior-inferior right frontal convexity, stable from prior head CT.   MRA head - not ordered  CTA H&N - not ordered  CT Perfusion - not ordered  CT Chest Abd and Pelvus - 5.2 cm x 1.5 cm pleural based soft tissue mass along the anterior aspect of the left upper lobe. A 1.4 cm pleural based soft tissue nodule is seen within this region on the prior study. These findings are concerning for the presence of a primary lung malignancy.  Carotid Doppler - not indicated  2D Echo - not indicated  [employer] Virus 2 - negative  LDL - 109  HgbA1c - 6.5  UDS - not ordered  VTE prophylaxis - SCDs  EEG 03/22/2020 moderate diffuse encephalopathy.  No seizures Diet  Diet Order            Diet Carb Modified Fluid consistency: Thin; Room service  appropriate? Yes  Diet effective now                 No antithrombotic prior to admission, now on No antithrombotic due to thrombocytopenia  Ongoing aggressive stroke risk factor management  Therapy recommendations:  pending  Disposition:  Pending  Hypertension  Home BP meds: Norvasc ; Coreg  Current BP meds: Coreg  Stable  SBP goal < 140 mm Hg initially . Long-term BP goal normotensive  Hyperlipidemia  Home Lipid lowering medication: Zocor 20 mg daily  LDL 109, goal < 70  Current lipid lowering medication: Lipitor 20 mg daily -> will D/C - contraindicated in [place]  Continue statin at discharge  Diabetes (Decadron)  Home diabetic meds: insulin  Current diabetic meds: insulin  HgbA1c 6.5, goal < 7.0 Recent Labs    03/23/20 1515 03/24/20 0714 03/24/20 1111  GLUCAP 264* 274* 212*    Other Stroke Risk Factors  Previous ETOH use  Previous drug use  Other Active Problems  Code status - Full code  Aortic Atherosclerosis (ICD10-I70.0)   AKI - creatinine - 127->124  Anemia - Hgb - 9.4 Thrombocytopenia - platelets - 38 Hyponatremia - Na - 121 Hypocalcemia - 7.9 Tachycardia - 115 - 143 - likely due to fever Fever - 102.3 - Maxipime ; Vancomycin ; Diflucan Multiple Myeloma Possible primary lung malignancy NPO - tube feedings  Hospital day # 5 The patient's altered mental status today is along with some intermittent visual hallucinations suggest likely sundowning versus ICU delirium.  Recommend Seroquel 12.5 mg at night to help in sleep and may increase as tolerated.  Patient is scheduled for TEE to look for endocarditis and cardiology has been consulted.  Continue Keppra 500 milligrams twice daily for seizure prophylaxis  .  Long discussion with Dr. Maren Beach as well as with patient and his wife using Spanish language video interpreter and answered questions.     Greater than 50% time during this 35-minute visit was spent on counseling and coordination of  care about his intracerebral hemorrhage and thrombocytopenia and answering questions.  Long discussion with patient and wife via [place] language video interpreter and patient's RN and answered questions.    Antony Contras, MD To contact Stroke Continuity provider, please refer to [email or web address] After hours, contact General Neurology

## 2020-03-24 NOTE — Progress Notes (Signed)
PT Cancellation Note  Patient Details Name: Trevor Shelton MRN: [id number] DOB: Nov 11, 1965   Cancelled Treatment:    Reason Eval/Treat Not Completed: Medical issues which prohibited therapy; patient with some increased symptoms this morning RN reports feels due to delirium and not sleeping.   Plans to help get him some sleep so RN cancelled PT for today.  Will attempt again another day.    Reginia Naas 03/24/2020, 12:05 PM  Magda Kiel, PT [facility] Pager:[phone] Office:[phone] 03/24/2020

## 2020-03-24 NOTE — Progress Notes (Signed)
Patient began having repetat ive movements including making the sign of the cross and rapid eye blinking. On exam, he will flaccidly drop arm to the bed, then when held in front of face, he will avoid contact. He has midline eyes but when I am examining him, he briefly looks to my side. I suspect this is behavioral, but will get stat EEG to confirm.   Roland Rack, MD [facility] [phone]  If 7pm- 7am, please page neurology on call as listed in [place].

## 2020-03-24 NOTE — Procedures (Signed)
Patient Name: Trevor Shelton  MRN: [id number]  Epilepsy Attending: Lora Havens  Referring Physician/Provider: Dr Roland Rack Date: 03/24/2020 Duration: 22.49 mins  Patient history: 54yo M with seizure like episode. EEG to evaluate for seizure.   Level of alertness: Awake  AEDs during EEG study: LEV  Technical aspects: This EEG study was done with scalp electrodes positioned according to the 10-20 International system of electrode placement. Electrical activity was acquired at a sampling rate of 500Hz  and reviewed with a high frequency filter of 70Hz  and a low frequency filter of 1Hz . EEG data were recorded continuously and digitally stored.   Description: No posterior dominant rhythm was seen. EEG showed continuous generalized 3 to 6 Hz theta-delta slowing. Significant eye flutter artifact was also noted. Hyperventilation and photic stimulation were not performed.     ABNORMALITY -Continuous slow, generalized  IMPRESSION: This study is suggestive of moderate diffuse encephalopathy, nonspecific etiology. No seizures or epileptiform discharges were seen throughout the recording.  Trevor Shelton

## 2020-03-24 NOTE — Progress Notes (Signed)
STAT EEG complete - results pending. ? ?

## 2020-03-24 NOTE — Progress Notes (Signed)
PROGRESS NOTE    Brysan Mcevoy  PTW:[id number] DOB: 04-25-1966 DOA: 04/01/2020 PCP: Patient, No Pcp Per   Brief Narrative: 54 year old male with history of hypertension,hyperlipidemia was admitted 8/8-8/26 discharged to inpatient rehab. He was low because endoscope found to have multiple myeloma with spinal active lesions, left rib fracture on 03/18/2017,impending cord compression and underwent radiation therapy, decadron, impending cord compression s/p CyBorD chemotherapy recently, he was on HD short term.  He was discharged to inpatient rehab on 03/03/2020.  He underwent chemotherapy on 03/15/2020 and next chemotherapy is scheduled for 03/22/2020.  Patient had significant nosebleed on 03/17/2020.  He received 2 units PRBCs on 03/18/2020 due to hemoglobin 6.8.  Lovenox was stopped.  Patient spiked fevers with T-max 102 this afternoon. A CT chest/abdmen/pelvis was ordered but not done yet. His labs showed Na 114, CL 86, CO2 19, BUN 47, Cr 1.06, WBC 4.6 (was 2.9 on 9/10), hemoglobin 10, PLT 46 which is at his baseline, nonrevealing UA.  Covid is negative.CXR showed Areas of patchy atelectasis bilaterally. KUB showed stool burden. Patient was empirically started on vancomycin and Zosyn at inpatient rehab. he was transferred under [facility] 9/12 am.  CT chest/abd pelvis was done: 1. Mild to moderate severity bilateral areas of atelectasis and/or infiltrate. 2. 5.2 cm x 1.5 cm pleural based soft tissue mass along the anterior aspect of the left upper lobe. A 1.4 cm pleural based soft tissue nodule is seen within this region on the prior study. These findings are concerning for the presence of a primary lung malignancy. Further evaluation with a nuclear medicine PET/CT is recommended.  3. Findings consistent with multiple myeloma versus diffuse osseous metastasis, with chronic pathologic fractures at the levels of T6 and T9 vertebral bodies. 4. Markedly enlarged prostate gland. 5. 4.0 cm x 3.2 cm fat-containing right  scrotal hernia.  03/20/20: MSSA bacteremia- abx de-escalated to Ancef, Dr. Johnnye Sima ID consulted.  Also found to have a small intracranial hemorrhage seen by neurology, MRI brain and repeat CT.  6 hours later which showed decrease in size of conspicuity of unknown focus of intracranial hemorrhage.  Patient was managed on Percocet. Also seen by oncologist 9/13: Ongoing epistaxis and plt down to 25k- 1 unit platelet transfusion ongoing oncology 9.13-14 overnight patient was more lethargic and less responsive also having trouble breathing possible seizure seen by neurology repeat CT head no acute finding, loaded with Keppra transferred to ICU 4N and was transferred he has been more alert/awake. EEG -moderate diffuse encephalopathy, nonspecific, no seizures or epileptiform discharges.  Patient was monitored in ICU 24 hours until 9/15   Subjective:  Confused, staring episode and was unresponsive thsi am, not acknowleding this am- seen neuro and ordered EEG. Now more alert,awake and appropriate. some abdomen distension, last BM yesterday c/o indigestion pain Tmax 99.3 On RA and satting well Video used for Interpretor    Assessment & Plan:  Stroke with acute intracranial hemorrhage involving the anterior-inferior right frontal convexity likely due to thrombocytopenia from effect of multiple myeloma and chemotherapy.  Repeat CT head decreased size of right frontal anterior inferior convexity motors, MRI.  Acute intracranial hemorrhage stable.  Seen by neurology no further recommendation.  LDL 109 hemoglobin A1c 6.5, 2D echo and carotid 2D echo not indicated for cva.  Staring episode this morning and stat EEG obtain unremarkable, neurology following closely.  He is on Keppra.  Seems to have resolved.  Severe Sepsis due to MSSA bacteremia: Suspecting hospital-acquired.  Patient immunocompromised with recent chemo, multiple myeloma  but no central line, source could be pneumonia.  ID following, currently on  Ancef, acyclovir prophylaxis awaiting TEE tomorrow. Npo past midnight- spoke w/ RN. Repeat blood cultures 9/13 NGTD. Respiratory virus panel COVID-19 MRSA .  Acute hypoxic respiratory failure/ pneumonia with chest x-ray showing areas of atelectasis bilaterally.  Doing well on room air.  Continue antibiotics for pneumonia incentive spirometry and PT OT  Metastatic multiple myeloma:treated with radiation of spine due to concern for impending cord compression- MRI 8.8.21: Epidural tumor extension at T6 and T9 and T6 lesion severely narrowed thecal sac and deformed the spinal cord . He had velcade and dexamethasone, acyclovir, diflucan.  Oncology following closely continue current plan.   Pancytopenia/Thrombcytopenia-secondary to patient's recent chemotherapy with Velcade and dexamethasone 03/15/2020.  S/p 1 unit platelet transfusion 9/13.  CBC uptrending nicely.  Continue to monitor  Recent Labs  Lab 03/22/20 2045 03/23/20 0607 03/24/20 1018  HGB 7.0* 7.8* 8.9*  HCT 20.3* 23.2* 25.7*  WBC 3.5* 3.4* 5.1  PLT 38* 41* 50*   Anemia from multiple myeloma/chemo: Also acute blood loss with epistaxis: Patient had 2 units PRBC 9/10 for hemoglobin of 6.8.  H&H improved 8.9 g today.   Epistaxis-resolved.  Continue Afrin-change to prn  Acute encephalopathy multifatorial-likely multifactorial in the setting of sepsis, stroke.mri brain w/ small bleed, and also w/ sepsis, hyponatremia.  Appears much more alert awake.  There is question of seizure so was transferred to neuro ICU 9/14 and is on Keppra.EEG showing moderate encephalopathy no epileptiform discharges.  On any staring episode this morning seen by neurology and EEG stat no acute finding.  Chest pain/diffuse body pain likely from likely from myeloma.  Serial troponin negative continue pain control.    Left upper lobe with 5.2/1.5 cm pleural based mass- concern for primary lung malignancy per CT. followed by oncology closely he may need biopsy once  stable defer to oncology for timing-I have notified oncology this morning for need for continued follow-up.  Hyponatremia: Likely hypovolemic.  TSH cortisol okay.  Sodium improved to 130 corrected sodium si better. Off NS IV fluids, nephrology signed off.   Recent Labs  Lab 03/21/20 1922 03/21/20 2259 03/22/20 0101 03/23/20 0607 03/24/20 1018  NA 122* 124* 125* 129* 130*   AKI/Short term HD in august 2021: Renal function is stable Recent Labs  Lab 03/21/20 1922 03/21/20 2259 03/22/20 0101 03/23/20 0607 03/24/20 1018  BUN 44* 41* 40* 40* 38*  CREATININE 1.13 1.06 1.02 4.03 4.74   Metabolic acidosis:Bicarb at 19.Monitor  T2DM-newly diagnosed in august 2021. a1c 6.5.  Blood sugar poorly controlled still will increase Lantus further to 28 units , keep on sliding scale and monitor and adjust. Recent Labs  Lab 03/23/20 0735 03/23/20 1106 03/23/20 1515 03/24/20 0714 03/24/20 1111  GLUCAP 231* 303* 264* 274* 212*   Chronic constipation-stable. Had BM 9/15 Abdominal distention this morning will obtain CT abdomen pelvis without contrast.  Had bowel movement yesterday  HTN/HLD- bp meds amlodipine on hold, on coreg low dose and is on Midodrine- BP stable  Markedly enlarged prostate in CT: PSA normal 1.5  Type 7  Stool charted-C. difficile unable to be sent no diarrhea. Off    Weakness/debility/physical deconditioning: Continue PT OT, suspect multifactorial in the setting of multiple illness new cancer chemotherapy. Cir eventually.  DVT prophylaxis: SCDs Start: 03/20/20 0020 Code Status:   Code Status: Full Code  Family Communication: Plan of care discussed with patient at bedside with [place] interpreter.  Status is: Inpatient  Remains inpatient appropriate because:Ongoing diagnostic testing needed not appropriate for outpatient work up and Remains hospitalized due to ongoing management of MSSA sepsis, pancytopenia  Dispo: The patient is from: [facility]              Anticipated d/c  is to: TBD. Can transfer out of ICU if stable after TEE tomorrow               Anticipated d/c date is: > 3 days              Patient currently is not medically stable to d/c. Nutrition: Diet Order            Diet Carb Modified Fluid consistency: Thin; Room service appropriate? Yes  Diet effective now                Body mass index is 22.26 kg/m.  Consultants:see note  Procedures: see note.  CT chest abd pelvis w/ contrast: Mild to moderate severity bilateral areas of atelectasis and/or infiltrate. 2. 5.2 cm x 1.5 cm pleural based soft tissue mass along the anterior aspect of the left upper lobe. A 1.4 cm pleural based soft tissue nodule is seen within this region on the prior study. These findings are concerning for the presence of a primary lung malignancy. Further evaluation with a nuclear medicine PET/CT is recommended. 3. Findings consistent with multiple myeloma versus diffuse osseous metastasis, with chronic pathologic fractures at the levels of T6 and T9 vertebral bodies. 4. Markedly enlarged prostate gland. 5. 4.0 cm x 3.2 cm fat-containing right scrotal hernia. 6. Mild cardiomegaly. 7. Small hiatal hernia. 8. Aortic atherosclerosis. 9. Small simple cyst within the left kidney.  Microbiology:see note Blood Culture    Component Value Date/Time   SDES BLOOD RIGHT HAND 03/21/2020 1359   SDES BLOOD LEFT HAND 03/21/2020 1359   SPECREQUEST  03/21/2020 1359    BOTTLES DRAWN AEROBIC AND ANAEROBIC Blood Culture results may not be optimal due to an excessive volume of blood received in culture bottles   SPECREQUEST  03/21/2020 1359    BOTTLES DRAWN AEROBIC AND ANAEROBIC Blood Culture results may not be optimal due to an excessive volume of blood received in culture bottles   CULT  03/21/2020 1359    NO GROWTH 2 DAYS Performed at [facility], [place] [address], [place], [place] [address]    CULT  03/21/2020 1359    NO GROWTH 2 DAYS Performed at [facility], [place] [address], [place], [place] [address]    REPTSTATUS PENDING 03/21/2020 1359   REPTSTATUS PENDING 03/21/2020 1359    Other culture-see note  Medications: Scheduled Meds: . (feeding supplement) PROSource Plus  30 mL Oral BID BM  . sodium chloride   Intravenous Once  . acyclovir  400 mg Oral Daily  . carvedilol  3.125 mg Oral BID WC  . Chlorhexidine Gluconate Cloth  6 each Topical Daily  . insulin aspart  0-9 Units Subcutaneous TID WC  . insulin aspart  3 Units Subcutaneous TID WC  . insulin glargine  25 Units Subcutaneous QHS  . levETIRAcetam  500 mg Oral BID  . lidocaine  2 patch Transdermal Q24H  . LORazepam      . melatonin  3 mg Oral QHS  . midodrine  7.5 mg Oral BID WC  . oxymetazoline  1 spray Each Nare Q6H  . pantoprazole  40 mg Oral BID AC  . polyethylene glycol  17 g Oral Daily  .  Ensure Max Protein  11 oz Oral BID BM  . senna-docusate  2 tablet Oral BID  . sodium chloride  2 spray Each Nare Q1H while awake  . sucralfate  1 g Oral TID WC & HS   Continuous Infusions: .  ceFAZolin (ANCEF) IV Stopped (03/24/20 0546)    Antimicrobials: Anti-infectives (From admission, onward)   Start     Dose/Rate Route Frequency Ordered Stop   03/20/20 1600  ceFAZolin (ANCEF) IVPB 2g/100 mL premix        2 g 200 mL/hr over 30 Minutes Intravenous Every 8 hours 03/20/20 1505     03/20/20 1000  acyclovir (ZOVIRAX) 200 MG capsule 400 mg        400 mg Oral Daily 03/15/2020 2217     03/20/20 1000  fluconazole (DIFLUCAN) tablet 100 mg        100 mg Oral Daily 04/03/2020 2217 03/23/20 0959   03/20/20 0800  vancomycin (VANCOREADY) IVPB 750 mg/150 mL  Status:  Discontinued        750 mg 150 mL/hr over 60 Minutes Intravenous Every 12 hours 04/06/2020 2217 03/20/20 1452   03/20/20 0800  ceFEPIme (MAXIPIME) 2 g in sodium chloride 0.9 % 100 mL IVPB  Status:  Discontinued        2 g 200 mL/hr over 30 Minutes Intravenous Every 8 hours 03/20/20 0001 03/20/20 1452   03/20/20 0015  ceFEPIme  (MAXIPIME) 2 g in sodium chloride 0.9 % 100 mL IVPB        2 g 200 mL/hr over 30 Minutes Intravenous  Once 03/20/20 0001 03/20/20 0159   03/10/2020 2230  vancomycin (VANCOREADY) IVPB 1250 mg/250 mL  Status:  Discontinued        1,250 mg 166.7 mL/hr over 90 Minutes Intravenous  Once 03/23/2020 2217 03/16/2020 2225   04/07/2020 2224  piperacillin-tazobactam (ZOSYN) IVPB 3.375 g  Status:  Discontinued        3.375 g 12.5 mL/hr over 240 Minutes Intravenous Every 8 hours 03/29/2020 2217 03/20/20 0000     Objective: Vitals: Today's Vitals   03/24/20 0600 03/24/20 0610 03/24/20 0700 03/24/20 0800  BP: (!) 138/95  (!) 140/93   Pulse: (!) 105  (!) 101   Resp: (!) 22  (!) 22   Temp:    99.3 F (37.4 C)  TempSrc:    Axillary  SpO2: 97%  96%   Weight:      PainSc:  Asleep      Intake/Output Summary (Last 24 hours) at 03/24/2020 1142 Last data filed at 03/24/2020 0700 Gross per 24 hour  Intake 929.86 ml  Output 1100 ml  Net -170.14 ml   Filed Weights   03/22/20 0500 03/23/20 2218 03/24/20 0500  Weight: 57.2 kg 57 kg 57 kg   Weight change:   Intake/Output from previous day: 09/15 0701 - 09/16 0700 In: 929.9 [P.O.:420; I.V.:110; IV Piggyback:399.9] Out: 1600 [Urine:1600] Intake/Output this shift: No intake/output data recorded.  Examination: General exam: AAO-confused this morning but later on was appropriate, NAD, weak appearing. HEENT:Oral mucosa moist, Ear/Nose WNL grossly, dentition normal. Respiratory system: bilaterally CLEAR,no wheezing or crackles,no use of accessory muscle Cardiovascular system: S1 & S2 +, No JVD,. Gastrointestinal system: Abdomen soft, moderately distended, mildly tender,BS+ Nervous System:Alert, awake, moving extremities and grossly nonfocal Extremities: No edema, distal peripheral pulses palpable.  Skin: No rashes,no icterus. MSK: Normal muscle bulk,tone, power  Data Reviewed: I have personally reviewed following labs and imaging studies CBC: Recent Labs  Lab 03/21/20 1359 03/22/20 0101 03/22/20 2045 03/23/20 0607 03/24/20 1018  WBC 2.5* 3.3* 3.5* 3.4* 5.1  NEUTROABS 2.2 3.1 2.9 2.8 PENDING  HGB 7.4* 7.4* 7.0* 7.8* 8.9*  HCT 21.2* 21.0* 20.3* 23.2* 25.7*  MCV 83.1 84.0 84.2 86.2 85.1  PLT 25* 40* 38* 41* 50*   Basic Metabolic Panel: Recent Labs  Lab 03/21/20 1922 03/21/20 2259 03/22/20 0101 03/23/20 0607 03/24/20 1018  NA 122* 124* 125* 129* 130*  K 3.6 3.5 3.4* 4.0 3.8  CL 96* 97* 97* 102 102  CO2 15* 18* 19* 19* 16*  GLUCOSE 263* 202* 195* 266* 241*  BUN 44* 41* 40* 40* 38*  CREATININE 1.13 1.06 1.02 0.94 1.12  CALCIUM 8.1* 8.0* 8.1* 8.2* 8.6*  MG  --   --   --  1.7  --   PHOS  --   --   --  2.7  --    GFR: Estimated Creatinine Clearance: 59.3 mL/min (by C-G formula based on SCr of 1.12 mg/dL). Liver Function Tests: Recent Labs  Lab 03/30/2020 1802 03/22/20 0101 03/23/20 0607 03/24/20 1018  AST 38 35 45* 29  ALT _0 ALKPHOS 94 73 70 78  BILITOT 0.6 0.5 0.2* 0.4  PROT 6.3* 5.6* 5.7* 5.7*  ALBUMIN 1.9* 1.4* 1.5* 1.6*   No results for input(s): LIPASE, AMYLASE in the last 168 hours. Recent Labs  Lab 03/22/20 0423  AMMONIA 45*   Coagulation Profile: Recent Labs  Lab 03/20/20 0201 03/20/20 1134  INR 1.4* 1.5*   Cardiac Enzymes: No results for input(s): CKTOTAL, CKMB, CKMBINDEX, TROPONINI in the last 168 hours. BNP (last 3 results) No results for input(s): PROBNP in the last 8760 hours. HbA1C: No results for input(s): HGBA1C in the last 72 hours. CBG: Recent Labs  Lab 03/23/20 0735 03/23/20 1106 03/23/20 1515 03/24/20 0714 03/24/20 1111  GLUCAP 231* 303* 264* 274* 212*   Lipid Profile: No results for input(s): CHOL, HDL, LDLCALC, TRIG, CHOLHDL, LDLDIRECT in the last 72 hours. Thyroid Function Tests: No results for input(s): TSH, T4TOTAL, FREET4, T3FREE, THYROIDAB in the last 72 hours. Anemia Panel: No results for input(s): VITAMINB12, FOLATE, FERRITIN, TIBC, IRON, RETICCTPCT in the  last 72 hours. Sepsis Labs: Recent Labs  Lab 03/20/20 0040 03/22/20 0101 03/22/20 0423  LATICACIDVEN 1.4 1.4 1.2    Recent Results (from the past 240 hour(s))  Culture, Urine     Status: Abnormal   Collection Time: 03/23/2020  2:28 PM   Specimen: Urine, Random  Result Value Ref Range Status   Specimen Description URINE, RANDOM  Final   Special Requests NONE  Final   Culture (A)  Final    <10,000 COLONIES/mL INSIGNIFICANT GROWTH Performed at [facility], [place] [address], [place], [place] [address]    Report Status 03/20/2020 FINAL  Final  Culture, blood (routine x 2)     Status: Abnormal   Collection Time: 03/16/2020  6:02 PM   Specimen: BLOOD  Result Value Ref Range Status   Specimen Description BLOOD RIGHT ANTECUBITAL  Final   Special Requests   Final    BOTTLES DRAWN AEROBIC AND ANAEROBIC Blood Culture adequate volume   Culture  Setup Time   Final    GRAM POSITIVE COCCI IN CLUSTERS IN BOTH AEROBIC AND ANAEROBIC BOTTLES Organism ID to follow CRITICAL RESULT CALLED TO, READ BACK BY AND VERIFIED WITH: Barth Kirks PHARMD, AT [place] 03/20/20 BY Rush Landmark Performed at [facility], [place]  [address] [place], [place] [address]    Culture STAPHYLOCOCCUS AUREUS (A)  Final   Report Status 03/22/2020 FINAL  Final   Organism ID, Bacteria STAPHYLOCOCCUS AUREUS  Final      Susceptibility   Staphylococcus aureus - MIC*    CIPROFLOXACIN <=0.5 SENSITIVE Sensitive     ERYTHROMYCIN <=0.25 SENSITIVE Sensitive     GENTAMICIN <=0.5 SENSITIVE Sensitive     OXACILLIN 0.5 SENSITIVE Sensitive     TETRACYCLINE <=1 SENSITIVE Sensitive     VANCOMYCIN 1 SENSITIVE Sensitive     TRIMETH/SULFA <=10 SENSITIVE Sensitive     CLINDAMYCIN <=0.25 SENSITIVE Sensitive     RIFAMPIN <=0.5 SENSITIVE Sensitive     Inducible Clindamycin NEGATIVE Sensitive     * STAPHYLOCOCCUS AUREUS  Blood Culture ID Panel (Reflexed)     Status: Abnormal   Collection Time: 03/24/2020  6:02 PM  Result Value Ref Range  Status   Enterococcus faecalis NOT DETECTED NOT DETECTED Final   Enterococcus Faecium NOT DETECTED NOT DETECTED Final   Listeria monocytogenes NOT DETECTED NOT DETECTED Final   Staphylococcus species DETECTED (A) NOT DETECTED Final    Comment: CRITICAL RESULT CALLED TO, READ BACK BY AND VERIFIED WITH: Barth Kirks PHARMD, AT 0740 03/20/20 BY D. VANHOOK    Staphylococcus aureus (BCID) DETECTED (A) NOT DETECTED Final    Comment: CRITICAL RESULT CALLED TO, READ BACK BY AND VERIFIED WITH: Barth Kirks PHARMD, AT 0740 03/20/20 BY D. VANHOOK    Staphylococcus epidermidis NOT DETECTED NOT DETECTED Final   Staphylococcus lugdunensis NOT DETECTED NOT DETECTED Final   Streptococcus species NOT DETECTED NOT DETECTED Final   Streptococcus agalactiae NOT DETECTED NOT DETECTED Final   Streptococcus pneumoniae NOT DETECTED NOT DETECTED Final   Streptococcus pyogenes NOT DETECTED NOT DETECTED Final   A.calcoaceticus-baumannii NOT DETECTED NOT DETECTED Final   Bacteroides fragilis NOT DETECTED NOT DETECTED Final   Enterobacterales NOT DETECTED NOT DETECTED Final   Enterobacter cloacae complex NOT DETECTED NOT DETECTED Final   Escherichia coli NOT DETECTED NOT DETECTED Final   Klebsiella aerogenes NOT DETECTED NOT DETECTED Final   Klebsiella oxytoca NOT DETECTED NOT DETECTED Final   Klebsiella pneumoniae NOT DETECTED NOT DETECTED Final   Proteus species NOT DETECTED NOT DETECTED Final   Salmonella species NOT DETECTED NOT DETECTED Final   Serratia marcescens NOT DETECTED NOT DETECTED Final   Haemophilus influenzae NOT DETECTED NOT DETECTED Final   Neisseria meningitidis NOT DETECTED NOT DETECTED Final   Pseudomonas aeruginosa NOT DETECTED NOT DETECTED Final   Stenotrophomonas maltophilia NOT DETECTED NOT DETECTED Final   Candida albicans NOT DETECTED NOT DETECTED Final   Candida auris NOT DETECTED NOT DETECTED Final   Candida glabrata NOT DETECTED NOT DETECTED Final   Candida krusei NOT DETECTED NOT  DETECTED Final   Candida parapsilosis NOT DETECTED NOT DETECTED Final   Candida tropicalis NOT DETECTED NOT DETECTED Final   Cryptococcus neoformans/gattii NOT DETECTED NOT DETECTED Final   Meth resistant mecA/C and MREJ NOT DETECTED NOT DETECTED Final    Comment: Performed at [facility], [address], [place], [place] [address]  SARS Coronavirus 2 by RT PCR (hospital order, performed in [facility] hospital lab) Nasopharyngeal Nasopharyngeal Swab     Status: None   Collection Time: 03/31/2020  8:37 PM   Specimen: Nasopharyngeal Swab  Result Value Ref Range Status   SARS Coronavirus 2 NEGATIVE NEGATIVE Final    Comment: (NOTE) SARS-CoV-2 target nucleic acids are NOT DETECTED.  The SARS-CoV-2 RNA is generally detectable  in upper and lower respiratory specimens during the acute phase of infection. The lowest concentration of SARS-CoV-2 viral copies this assay can detect is 250 copies / mL. A negative result does not preclude SARS-CoV-2 infection and should not be used as the sole basis for treatment or other patient management decisions.  A negative result may occur with improper specimen collection / handling, submission of specimen other than nasopharyngeal swab, presence of viral mutation(s) within the areas targeted by this assay, and inadequate number of viral copies (<250 copies / mL). A negative result must be combined with clinical observations, patient history, and epidemiological information.  Fact Sheet for Patients:   StrictlyIdeas.no  Fact Sheet for Healthcare Providers: BankingDealers.co.za  This test is not yet approved or  cleared by the [place] FDA and has been authorized for detection and/or diagnosis of SARS-CoV-2 by FDA under an Emergency Use Authorization (EUA).  This EUA will remain in effect (meaning this test can be used) for the duration of the COVID-19 declaration under Section 564(b)(1) of the  Act, 21 U.S.C. section 360bbb-3(b)(1), unless the authorization is terminated or revoked sooner.  Performed at [facility], [place] [address], [place], [place] [address]   MRSA PCR Screening     Status: None   Collection Time: 03/20/20  4:21 AM   Specimen: Nasopharyngeal  Result Value Ref Range Status   MRSA by PCR NEGATIVE NEGATIVE Final    Comment:        The GeneXpert MRSA Assay (FDA approved for NASAL specimens only), is one component of a comprehensive MRSA colonization surveillance program. It is not intended to diagnose MRSA infection nor to guide or monitor treatment for MRSA infections. Performed at [facility], [place] [address] [place], [place] [address]   Respiratory Panel by PCR     Status: None   Collection Time: 03/20/20  4:21 AM   Specimen: Nasopharyngeal Swab; Respiratory  Result Value Ref Range Status   Adenovirus NOT DETECTED NOT DETECTED Final   Coronavirus 229E NOT DETECTED NOT DETECTED Final    Comment: (NOTE) The Coronavirus on the Respiratory Panel, DOES NOT test for the novel  Coronavirus (2019 nCoV)    Coronavirus HKU1 NOT DETECTED NOT DETECTED Final   Coronavirus NL63 NOT DETECTED NOT DETECTED Final   Coronavirus OC43 NOT DETECTED NOT DETECTED Final   Metapneumovirus NOT DETECTED NOT DETECTED Final   Rhinovirus / Enterovirus NOT DETECTED NOT DETECTED Final   Influenza A NOT DETECTED NOT DETECTED Final   Influenza B NOT DETECTED NOT DETECTED Final   Parainfluenza Virus 1 NOT DETECTED NOT DETECTED Final   Parainfluenza Virus 2 NOT DETECTED NOT DETECTED Final   Parainfluenza Virus 3 NOT DETECTED NOT DETECTED Final   Parainfluenza Virus 4 NOT DETECTED NOT DETECTED Final   Respiratory Syncytial Virus NOT DETECTED NOT DETECTED Final   Bordetella pertussis NOT DETECTED NOT DETECTED Final   Chlamydophila pneumoniae NOT DETECTED NOT DETECTED Final   Mycoplasma pneumoniae NOT DETECTED NOT DETECTED Final    Comment: Performed at [facility], [place]. [address], [place], [place] [address]  Culture, blood (Routine X 2) w Reflex to ID Panel     Status: None (Preliminary result)   Collection Time: 03/21/20  1:59 PM   Specimen: BLOOD RIGHT HAND  Result Value Ref Range Status   Specimen Description BLOOD RIGHT HAND  Final   Special Requests   Final    BOTTLES DRAWN AEROBIC AND ANAEROBIC Blood Culture results  may not be optimal due to an excessive volume of blood received in culture bottles   Culture   Final    NO GROWTH 2 DAYS Performed at [facility], [place] [address] [place], [place] [address]    Report Status PENDING  Incomplete  Culture, blood (Routine X 2) w Reflex to ID Panel     Status: None (Preliminary result)   Collection Time: 03/21/20  1:59 PM   Specimen: BLOOD LEFT HAND  Result Value Ref Range Status   Specimen Description BLOOD LEFT HAND  Final   Special Requests   Final    BOTTLES DRAWN AEROBIC AND ANAEROBIC Blood Culture results may not be optimal due to an excessive volume of blood received in culture bottles   Culture   Final    NO GROWTH 2 DAYS Performed at [facility], [place] [address], [place], [place] [address]    Report Status PENDING  Incomplete     Radiology Studies: EEG  Result Date: 03/22/2020 Lora Havens, MD     03/22/2020  1:52 PM Patient Name: Caydn Justen MRN: [id number] Epilepsy Attending: Lora Havens Referring Physician/Provider: Dr Karena Addison Aroor Date: 03/22/2020 Duration: 23.25 mins Patient history: 54yo m with AMS. CTH showed right frontal mass concerning for mets. EEG to evaluate for seizure. Level of alertness: awake AEDs during EEG study: LEV Technical aspects: This EEG study was done with scalp electrodes positioned according to the 10-20 International system of electrode placement. Electrical activity was acquired at a sampling rate of _0  and reviewed with a high frequency filter of _1  and a low frequency filter of _2 . EEG data were recorded continuously and  digitally stored. Description: No posterior dominant rhythm was seen. EEG showed continuous generalized 3 to 6 Hz theta-delta slowing. Hyperventilation and photic stimulation were not performed.   ABNORMALITY -Continuous slow, generalized IMPRESSION: This study is suggestive of moderate diffuse encephalopathy, nonspecific etiology. No seizures or epileptiform discharges were seen throughout the recording. Lora Havens   DG Chest Port 1 View  Result Date: 03/23/2020 CLINICAL DATA:  Difficulty breathing EXAM: PORTABLE CHEST 1 VIEW COMPARISON:  03/23/2019 FINDINGS: Cardiac shadow is mildly prominent but stable. Patchy airspace opacity is again noted bilaterally but improved when compared with the prior exam. No sizable effusion or pneumothorax is noted. No bony abnormality is seen. IMPRESSION: Improving aeration bilaterally. Electronically Signed   By: Inez Catalina M.D.   On: 03/23/2020 07:37   EEG adult  Result Date: 03/24/2020 Lora Havens, MD     03/24/2020 11:15 AM Patient Name: Medford Staheli MRN: [id number] Epilepsy Attending: Lora Havens Referring Physician/Provider: Dr Roland Rack Date: 03/24/2020 Duration: 22.49 mins Patient history: 54yo M with seizure like episode. EEG to evaluate for seizure. Level of alertness: Awake AEDs during EEG study: LEV Technical aspects: This EEG study was done with scalp electrodes positioned according to the 10-20 International system of electrode placement. Electrical activity was acquired at a sampling rate of _3  and reviewed with a high frequency filter of _4  and a low frequency filter of _5 . EEG data were recorded continuously and digitally stored. Description: No posterior dominant rhythm was seen. EEG showed continuous generalized 3 to 6 Hz theta-delta slowing. Significant eye flutter artifact was also noted. Hyperventilation and photic stimulation were not performed.   ABNORMALITY -Continuous slow, generalized IMPRESSION: This study is  suggestive of moderate diffuse encephalopathy, nonspecific etiology. No seizures or epileptiform discharges were seen throughout the recording. Priyanka Barbra Sarks  LOS: 5 days   Antonieta Pert, MD [facility]  03/24/2020, 11:42 AM

## 2020-03-24 NOTE — Progress Notes (Signed)
[facility] [phone]   [phone]  PROGRESS NOTE  Patient Care Team: Patient, No Pcp Per as PCP - General (General Practice)  Hematological/Oncological History # Lytic Lesions Throughout Skeleton/Hypercalcemia # Pathologic Fractures of Spine/Soft Tissue Masses in Spine #Concern for Multiple Myeloma 1) 02/14/2020: presented to the emergency department with markedly worsening back pain. 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 The T6 lesion severely narrows the thecal sac and deforms the spinal cord. 3) 02/18/2020: establish care with Dr. Lorenso Courier    HISTORY OF PRESENTING ILLNESS:  Trevor Shelton 55 y.o. male with medical history significant for HTN and HLD who presented with back pain and was found to have cord compression and lytic lesions concerning for multiple myeloma.   On review of the previous records progress he initially presented to the emergency department on 02/14/2020 with markedly worsening back pain. The pain has been going on for approximately 1 month's time when he initially presented. In the emergency department had a CT chest abdomen pelvis which showed lytic lesions throughout the skeleton consistent with multiple myeloma or metastatic disease. There were also signs of compression fracture of the spine. An MRI spine was ordered which showed epidural tumor extension at T6 and T8-9. The T6 lesion severely narrowed the thecal sac and deforms the spinal cord. The patient was referred to radiation oncology. In addition to radiation oncology, medical oncology was consulted for further evaluation and management as well.  Remains in the neuro ICU.  He was seen by neurology overnight due to episode of having repetitive movement and rapid eye blinking.  He would flaccidly drop arm to the bed during that time.  EEG  performed earlier today which was suggestive of moderate diffuse encephalopathy of nonspecific etiology, no seizures identified.  The patient was seen today with the assistance of a video interpreter.  His sister is at the bedside.  He is complaining of increased abdominal distention and is due to have a CT of the abdomen pelvis shortly.  He reports increased reflux symptoms as well as nausea and vomiting.  He states that his bowels move once a day.  Reports pain to his left chest.  No bleeding reported.  No fevers or chills.  He remains overall weak.  The remainder of a 10 point review of systems is negative.  MEDICAL HISTORY:  Past Medical History:  Diagnosis Date  . AKI (acute kidney injury) ([place]) 02/2020  . Hypercalcemia 02/2020  . Hypercholesteremia   . Hypertension     SURGICAL HISTORY: Past Surgical History:  Procedure Laterality Date  . HERNIA REPAIR    . IR FLUORO GUIDE CV LINE RIGHT  02/17/2020  . IR REMOVAL TUN CV CATH W/O FL  03/01/2020  . IR US GUIDE VASC ACCESS RIGHT  02/17/2020    SOCIAL HISTORY: Social History   Socioeconomic History  . Marital status: Married    Spouse name: Not on file  . Number of children: Not on file  . Years of education: Not on file  . Highest education level: Not on file  Occupational History  . Not on file  Tobacco Use  . Smoking status: Never Smoker  . Smokeless tobacco: Never Used  Vaping Use  . Vaping Use: Never used  Substance and Sexual Activity  . Alcohol use: Not Currently  . Drug use: Not Currently  . Sexual activity: Not  Currently  Other Topics Concern  . Not on file  Social History Narrative  . Not on file   Social Determinants of Health   Financial Resource Strain:   . Difficulty of Paying Living Expenses: Not on file  Food Insecurity:   . Worried About Charity fundraiser in the Last Year: Not on file  . Ran Out of Food in the Last Year: Not on file  Transportation Needs:   . Lack of Transportation (Medical): Not  on file  . Lack of Transportation (Non-Medical): Not on file  Physical Activity:   . Days of Exercise per Week: Not on file  . Minutes of Exercise per Session: Not on file  Stress:   . Feeling of Stress : Not on file  Social Connections:   . Frequency of Communication with Friends and Family: Not on file  . Frequency of Social Gatherings with Friends and Family: Not on file  . Attends Religious Services: Not on file  . Active Member of Clubs or Organizations: Not on file  . Attends Archivist Meetings: Not on file  . Marital Status: Not on file  Intimate Partner Violence:   . Fear of Current or Ex-Partner: Not on file  . Emotionally Abused: Not on file  . Physically Abused: Not on file  . Sexually Abused: Not on file    FAMILY HISTORY: Family History  Problem Relation Age of Onset  . Alzheimer's disease Mother   . Diabetes Father   . Diabetes Brother     ALLERGIES:  is allergic to other and penicillins.  MEDICATIONS:  Current Facility-Administered Medications  Medication Dose Route Frequency Provider Last Rate Last Admin  . (feeding supplement) PROSource Plus liquid 30 mL  30 mL Oral BID BM Li, Na, MD   30 mL at 03/24/20 1029  . 0.9 %  sodium chloride infusion (Manually program via Guardrails IV Fluids)   Intravenous Once Maryanna Shape, NP 10 mL/hr at 03/24/20 0600 Rate Verify at 03/24/20 0600  . acetaminophen (TYLENOL) suppository 650 mg  650 mg Rectal Q6H PRN Nicoletta Dress, Na, MD      . acetaminophen (TYLENOL) tablet 650 mg  650 mg Oral Q6H PRN Nicoletta Dress, Na, MD   650 mg at 03/22/20 1944  . acyclovir (ZOVIRAX) 200 MG capsule 400 mg  400 mg Oral Daily Li, Na, MD   400 mg at 03/24/20 1028  . albuterol (PROVENTIL) (2.5 MG/3ML) 0.083% nebulizer solution 2.5 mg  2.5 mg Nebulization TID PRN Kc, Maren Beach, MD      . alum & mag hydroxide-simeth (MAALOX/MYLANTA) 200-200-20 MG/5ML suspension 30 mL  30 mL Oral Q4H PRN Nicoletta Dress, Na, MD      . bisacodyl (DULCOLAX) suppository 10 mg  10 mg Rectal  Daily PRN Nicoletta Dress, Na, MD      . carvedilol (COREG) tablet 3.125 mg  3.125 mg Oral BID WC Li, Na, MD   3.125 mg at 03/24/20 1024  . ceFAZolin (ANCEF) IVPB 2g/100 mL premix  2 g Intravenous Q8H Einar Grad, RPH 200 mL/hr at 03/24/20 1357 2 g at 03/24/20 1357  . Chlorhexidine Gluconate Cloth 2 % PADS 6 each  6 each Topical Daily Aroor, Lanice Schwab, MD   6 each at 03/23/20 1630  . chlorproMAZINE (THORAZINE) tablet 25 mg  25 mg Oral QID PRN Nicoletta Dress, Na, MD      . diphenhydrAMINE (BENADRYL) 12.5 MG/5ML elixir 12.5-25 mg  12.5-25 mg Oral Q6H PRN Charlann Lange, MD      .  guaiFENesin-dextromethorphan (ROBITUSSIN DM) 100-10 MG/5ML syrup 5-10 mL  5-10 mL Oral Q6H PRN Li, Na, MD      . insulin aspart (novoLOG) injection 0-9 Units  0-9 Units Subcutaneous TID WC Nicoletta Dress, Na, MD   3 Units at 03/24/20 1345  . insulin aspart (novoLOG) injection 3 Units  3 Units Subcutaneous TID WC Nicoletta Dress, Na, MD   3 Units at 03/24/20 1345  . insulin glargine (LANTUS) injection 25 Units  25 Units Subcutaneous QHS Antonieta Pert, MD   25 Units at 03/23/20 2120  . levETIRAcetam (KEPPRA) tablet 500 mg  500 mg Oral BID Garvin Fila, MD   500 mg at 03/24/20 1024  . lidocaine (LIDODERM) 5 % 2 patch  2 patch Transdermal Q24H Nicoletta Dress, Na, MD   2 patch at 03/24/20 1025  . lidocaine (XYLOCAINE) 2 % jelly   Topical PRN Li, Na, MD      . lidocaine (XYLOCAINE) 2 % viscous mouth solution 15 mL  15 mL Mouth/Throat Q6H PRN Nicoletta Dress, Na, MD      . magic mouthwash w/lidocaine  5 mL Oral QID PRN Nicoletta Dress, Na, MD      . melatonin tablet 3 mg  3 mg Oral QHS Kc, Ramesh, MD   3 mg at 03/23/20 2125  . midodrine (PROAMATINE) tablet 7.5 mg  7.5 mg Oral BID WC Li, Na, MD   7.5 mg at 03/24/20 0510  . ondansetron (ZOFRAN) tablet 4 mg  4 mg Oral Q6H PRN Nicoletta Dress, Na, MD       Or  . ondansetron (ZOFRAN) injection 4 mg  4 mg Intravenous Q6H PRN Nicoletta Dress, Na, MD      . ondansetron (ZOFRAN) tablet 4 mg  4 mg Oral Q6H PRN Nicoletta Dress, Na, MD      . oxyCODONE (Oxy IR/ROXICODONE) immediate release tablet 5-10 mg  5-10 mg  Oral Q4H PRN Nicoletta Dress, Na, MD   10 mg at 03/24/20 0510  . oxymetazoline (AFRIN) 0.05 % nasal spray 1 spray  1 spray Each Nare BID PRN Kc, Ramesh, MD      . pantoprazole (PROTONIX) EC tablet 40 mg  40 mg Oral BID AC Li, Na, MD   40 mg at 03/24/20 1024  . polyethylene glycol (MIRALAX / GLYCOLAX) packet 17 g  17 g Oral Daily PRN Nicoletta Dress, Na, MD      . polyethylene glycol (MIRALAX / GLYCOLAX) packet 17 g  17 g Oral Daily Li, Na, MD      . prochlorperazine (COMPAZINE) tablet 5-10 mg  5-10 mg Oral Q6H PRN Nicoletta Dress, Na, MD       Or  . prochlorperazine (COMPAZINE) injection 5-10 mg  5-10 mg Intramuscular Q6H PRN Nicoletta Dress, Na, MD       Or  . prochlorperazine (COMPAZINE) suppository 12.5 mg  12.5 mg Rectal Q6H PRN Nicoletta Dress, Na, MD      . protein supplement (ENSURE MAX) liquid  11 oz Oral BID BM Li, Na, MD   11 oz at 03/23/20 1751  . QUEtiapine (SEROQUEL) tablet 12.5 mg  12.5 mg Oral QHS Garvin Fila, MD      . senna-docusate (Senokot-S) tablet 2 tablet  2 tablet Oral BID Charlann Lange, MD   2 tablet at 03/24/20 1028  . simethicone (MYLICON) chewable tablet 80 mg  80 mg Oral QID PRN Nicoletta Dress, Na, MD   80 mg at 03/23/20 1151  . sodium bicarbonate/sodium chloride mouthwash 1015m   Mouth Rinse PRN LCharlann Lange MD      .  sodium chloride (OCEAN) 0.65 % nasal spray 2 spray  2 spray Each Nare Q1H while awake Nicoletta Dress, Na, MD   2 spray at 03/24/20 1045  . sorbitol 70 % solution 30 mL  30 mL Oral Daily PRN Nicoletta Dress, Na, MD      . sucralfate (CARAFATE) 1 GM/10ML suspension 1 g  1 g Oral TID WC & HS Li, Na, MD   1 g at 03/24/20 1240  . white petrolatum (VASELINE) gel   Topical PRN Nicoletta Dress, Na, MD        REVIEW OF SYSTEMS:   Constitutional: ( - ) fevers, ( - )  chills , ( - ) night sweats Eyes: ( - ) blurriness of vision, ( - ) double vision, ( - ) watery eyes Ears, nose, mouth, throat, and face: (-) Epistaxis ( - ) mucositis, ( - ) sore throat Respiratory: ( + ) cough, ( - ) dyspnea, ( - ) wheezes Cardiovascular: ( - ) palpitation, ( - ) chest discomfort, ( - ) lower  extremity swelling Gastrointestinal:  ( + ) nausea, ( + ) heartburn, ( - ) change in bowel habits Skin: ( - ) abnormal skin rashes Lymphatics: ( - ) new lymphadenopathy, ( - ) easy bruising Neurological: ( - ) numbness, ( - ) tingling, ( - ) new weaknesses Behavioral/Psych: ( - ) mood change, ( - ) new changes  All other systems were reviewed with the patient and are negative.  PHYSICAL EXAMINATION:  GENERAL: Chronically ill-appearing male, laying in bed SKIN: skin color, texture, turgor are normal, no rashes or significant lesions OROPHARYNX: Dried blisters to lips are resolved EYES: conjunctiva are pink and non-injected, sclera non-injected LUNGS: Diminished breath sounds HEART: Regular rate and rhythm ABDOMEN: Positive bowel sounds, distended Musculoskeletal: no cyanosis of digits and no clubbing.  5/5 strength in the right lower extremity and 3/5 strength in the left lower extremity. PSYCH: alert & oriented x 3, fluent speech NEURO: no focal motor/sensory deficits  LABORATORY DATA:  I have reviewed the data as listed CBC Latest Ref Rng & Units 03/24/2020 03/23/2020 03/22/2020  WBC 4.0 - 10.5 K/uL 5.1 3.4(L) 3.5(L)  Hemoglobin 13.0 - 17.0 g/dL 8.9(L) 7.8(L) 7.0(L)  Hematocrit 39 - 52 % 25.7(L) 23.2(L) 20.3(L)  Platelets 150 - 400 K/uL 50(L) 41(L) 38(L)    CMP Latest Ref Rng & Units 03/24/2020 03/23/2020 03/22/2020  Glucose 70 - 99 mg/dL 241(H) 266(H) 195(H)  BUN 6 - 20 mg/dL 38(H) 40(H) 40(H)  Creatinine 0.61 - 1.24 mg/dL 1.12 0.94 1.02  Sodium 135 - 145 mmol/L 130(L) 129(L) 125(L)  Potassium 3.5 - 5.1 mmol/L 3.8 4.0 3.4(L)  Chloride 98 - 111 mmol/L 102 102 97(L)  CO2 22 - 32 mmol/L 16(L) 19(L) 19(L)  Calcium 8.9 - 10.3 mg/dL 8.6(L) 8.2(L) 8.1(L)  Total Protein 6.5 - 8.1 g/dL 5.7(L) 5.7(L) 5.6(L)  Total Bilirubin 0.3 - 1.2 mg/dL 0.4 0.2(L) 0.5  Alkaline Phos 38 - 126 U/L 78 70 73  AST 15 - 41 U/L 29 45(H) 35  ALT 0 - 44 U/L _0 RADIOGRAPHIC STUDIES: I have  personally reviewed the radiological images as listed and agreed with the findings in the report pronounced lytic lesions and cord compression in thoracic spine.   EEG  Result Date: 03/22/2020 Lora Havens, MD     03/22/2020  1:52 PM Patient Name: Trevor Shelton MRN: [id number] Epilepsy Attending: Lora Havens Referring Physician/Provider: Dr Karena Addison Aroor Date: 03/22/2020 Duration:  23.25 mins Patient history: 54yo m with AMS. CTH showed right frontal mass concerning for mets. EEG to evaluate for seizure. Level of alertness: awake AEDs during EEG study: LEV Technical aspects: This EEG study was done with scalp electrodes positioned according to the 10-20 International system of electrode placement. Electrical activity was acquired at a sampling rate of _0  and reviewed with a high frequency filter of _1  and a low frequency filter of _2 . EEG data were recorded continuously and digitally stored. Description: No posterior dominant rhythm was seen. EEG showed continuous generalized 3 to 6 Hz theta-delta slowing. Hyperventilation and photic stimulation were not performed.   ABNORMALITY -Continuous slow, generalized IMPRESSION: This study is suggestive of moderate diffuse encephalopathy, nonspecific etiology. No seizures or epileptiform discharges were seen throughout the recording. Lora Havens   DG Chest 2 View  Result Date: 03/18/2020 CLINICAL DATA:  Shortness of breath and fever EXAM: CHEST - 2 VIEW COMPARISON:  February 25, 2020 FINDINGS: There is mild atelectatic change in the left mid lung and bibasilar regions. There is no edema or airspace opacity. Heart is upper normal in size with pulmonary vascularity normal. No adenopathy. No bone lesions. IMPRESSION: Areas of patchy atelectasis bilaterally. No edema or airspace opacity. Cardiac silhouette within normal limits. Electronically Signed   By: Lowella Grip III M.D.   On: 03/12/2020 15:21   DG Abd 1 View  Result Date: 03/13/2020 CLINICAL  DATA:  Nausea, vomiting. EXAM: ABDOMEN - 1 VIEW COMPARISON:  March 10, 2020. FINDINGS: The bowel gas pattern is normal. Moderate amount of stool seen in right colon. No radio-opaque calculi or other significant radiographic abnormality are seen. IMPRESSION: Moderate stool burden is noted. No evidence of bowel obstruction or ileus. Electronically Signed   By: Marijo Conception M.D.   On: 03/13/2020 11:31   DG Abd 1 View  Result Date: 03/10/2020 CLINICAL DATA:  Abdominal pain. EXAM: ABDOMEN - 1 VIEW COMPARISON:  Radiograph 03/05/2020. FINDINGS: Mild gaseous distention of large and small bowel in a nonobstructive pattern. Small to moderate stool in the right colon, with diminished stool burden from prior exam. There is no evidence of free intra-abdominal air. No radiopaque calculi. No osseous abnormalities are seen. IMPRESSION: Bowel-gas pattern suggestive of ileus, with slight improvement in gaseous bowel distension over the past few days. Electronically Signed   By: Keith Rake M.D.   On: 03/10/2020 14:13   DG Abd 1 View  Result Date: 03/05/2020 CLINICAL DATA:  Abdominal pain and bloating. EXAM: ABDOMEN - 1 VIEW COMPARISON:  Abdominal radiograph 03/03/2020, CT 02/14/2020 FINDINGS: No evidence of bowel obstruction on the supine views. Mild gaseous distention of stomach with slight improvement from prior. Mild gaseous small bowel distension and a generalized pattern. Moderate colonic stool burden. No abnormal rectal distention. No radiopaque calculi. No acute osseous abnormalities are seen. IMPRESSION: Ileus bowel gas pattern with mild gaseous distension of small bowel loops and stomach with slight improvement from recent exam. No evidence of developing obstruction. Moderate colonic stool burden. Electronically Signed   By: Keith Rake M.D.   On: 03/05/2020 15:47   DG Abd 1 View  Result Date: 03/03/2020 CLINICAL DATA:  Cord compression.  Left upper quadrant pain. EXAM: ABDOMEN - 1 VIEW COMPARISON:   CT 02/14/2020. FINDINGS: Distended loops of small and large bowel noted suggesting adynamic ileus. Recent CT of the abdomen pelvis of 02/14/2020 did reveal a right inguinal hernia with herniation of fat. No free air. Bony lesions present best identified by  prior CT. IMPRESSION: Distended loops of small and large bowel noted suggesting adynamic ileus. Recent CT of the abdomen pelvis of 02/14/2020 however did reveal a right inguinal hernia with herniation of fat. Follow-up exams suggested to demonstrate resolution of bowel distention in order to exclude bowel obstruction. Electronically Signed   By: Marcello Moores  Register   On: 03/03/2020 13:56   CT HEAD WO CONTRAST  Result Date: 03/22/2020 CLINICAL DATA:  Headache EXAM: CT HEAD WITHOUT CONTRAST TECHNIQUE: Contiguous axial images were obtained from the base of the skull through the vertex without intravenous contrast. COMPARISON:  Brain MRI 03/20/2020 FINDINGS: Brain: Trace extra-axial hemorrhage beneath the right frontal lobe remains faintly visible. No new site of hemorrhage. No midline shift or other mass effect. Vascular: No hyperdense vessel or unexpected calcification. Skull: Numerous lucent lesions throughout the calvarium and skull base. The largest lesion is at the left clivus. Sinuses/Orbits: No acute finding. Other: None. IMPRESSION: 1. Trace extra-axial hemorrhage beneath the right frontal lobe remains faintly visible. No new site of hemorrhage. 2. Numerous lucent lesions throughout the calvarium and skull base, consistent with known multiple myeloma. Electronically Signed   By: Ulyses Jarred M.D.   On: 03/22/2020 00:12   CT HEAD WO CONTRAST  Result Date: 03/20/2020 CLINICAL DATA:  Stroke, follow-up. EXAM: CT HEAD WITHOUT CONTRAST TECHNIQUE: Contiguous axial images were obtained from the base of the skull through the vertex without intravenous contrast. COMPARISON:  Brain MRI 03/20/2020. CT head 03/20/2020 FINDINGS: Brain: The examination is mild to  moderately motion degraded, limiting evaluation. A previously demonstrated foci of intracranial hemorrhage along the anteroinferior right frontal lobe convexity (suspected both intraparenchymal and extra-axial) has decreased in conspicuity (series 5, image 16, image 17). No new site of acute intracranial hemorrhage is identified. There is no significant mass effect.  No midline shift. Vascular: No hyperdense vessel Skull: Redemonstrated are innumerable marrow replacing lesions throughout the calvarium, skull base and visualized upper cervical spine compatible with the known history of multiple myeloma Sinuses/Orbits: No acute orbital abnormality identified. No significant paranasal sinus disease or mastoid effusion. IMPRESSION: Motion degraded examination. Interval decrease in size and conspicuity of a known focus of intracranial hemorrhage along the anteroinferior right frontal lobe convexity (suspected both intraparenchymal and extra-axial). No new site of acute intracranial hemorrhage is identified. Redemonstrated innumerable marrow replacing lesions throughout the calvarium, skull base and visualized upper cervical spine compatible with the known history of multiple myeloma. Electronically Signed   By: Kellie Simmering DO   On: 03/20/2020 13:39   CT HEAD WO CONTRAST  Result Date: 03/20/2020 CLINICAL DATA:  Initial evaluation for acute altered mental status. EXAM: CT HEAD WITHOUT CONTRAST TECHNIQUE: Contiguous axial images were obtained from the base of the skull through the vertex without intravenous contrast. COMPARISON:  None available. FINDINGS: Brain: Cerebral volume within normal limits for age. There is a somewhat focal lobulated hyperdensity immediately subjacent to the right frontal calvarium (series 3, image 18) finding also seen on coronal sequence (series 5, images 21-23) and sagittal sequence (series 6, image 23). Lesion measures up to 2.1 cm in greatest dimension on sagittal sequence. While this  area is often prone to artifact, finding is suspicious for a small bleed. Finding is favored to be intra-axial in nature, although a small extra-axial component may be present as well. No significant mass effect or edema. No other acute intracranial hemorrhage. No acute large vessel territory infarct. No mass lesion, midline shift, or mass effect. No hydrocephalus. Vascular: No hyperdense vessel. Skull:  Scalp soft tissues demonstrate no acute finding. Multiple scattered lucent lesion seen throughout the calvarium and visualized upper cervical spine. Note made of a prominent 1.7 cm lytic lesion within the clivus. Findings are nonspecific, but concerning for possible Mets or myeloma. Sinuses/Orbits: Globes and orbital soft tissues within normal limits. Paranasal sinuses are clear. Trace left mastoid effusion noted. Other: None. IMPRESSION: 1. Small lobulated hyperdensity immediately subjacent to the right frontal calvarium as above. Highly suspicious for a small bleed. No significant mass effect or edema. 2. Multiple scattered lucent lesions throughout the calvarium and visualized upper cervical spine, consistent with history of multiple myeloma. 3. No other acute intracranial abnormality. Critical Value/emergent results were called by telephone at the time of interpretation on 03/20/2020 at 1:12 am to provider NA LI , who verbally acknowledged these results. Electronically Signed   By: Jeannine Boga M.D.   On: 03/20/2020 01:20   MR BRAIN W WO CONTRAST  Result Date: 03/20/2020 CLINICAL DATA:  Initial evaluation for brain mass or lesion. EXAM: MRI HEAD WITHOUT AND WITH CONTRAST TECHNIQUE: Multiplanar, multiecho pulse sequences of the brain and surrounding structures were obtained without and with intravenous contrast. CONTRAST:  75m GADAVIST GADOBUTROL 1 MMOL/ML IV SOLN COMPARISON:  Prior head CT from earlier the same day. FINDINGS: Brain: Cerebral volume within normal limits for age. No significant cerebral  white matter disease or other focal parenchymal signal abnormality. Abnormal T1/FLAIR hyperintensity involving the anterior inferior right frontal convexity, consistent with acute intracranial hemorrhage, corresponding with abnormality on prior CT (series 11, images 10-14). This appears to be both intraparenchymal and extra-axial, and is favored to have initially represented a small parenchymal bleed that developed subdural/extra-axial extension. Size of this hemorrhage measures approximately 1.8 cm in greatest dimension on sagittal sequences. No associated edema or significant regional mass effect. No definite underlying lesion. Slight asymmetric smooth dural thickening and enhancement seen elsewhere about the right cerebral convexity, favored to be reactive. Consideration given to possible myelomatous involvement of the dura, although this would be expected to be more nodular and irregular in appearance. No other acute intracranial hemorrhage identified. Single punctate focus of susceptibility artifact noted at the right frontal centrum semi ovale, likely a small chronic microhemorrhage, of doubtful significance in isolation. No evidence for acute or subacute infarct. No encephalomalacia to suggest chronic cortical infarction. No appreciable intra-axial mass lesion. Ventricles normal size without hydrocephalus. No other extra-axial fluid collection. 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 Midline structures intact. No other abnormal enhancement. Vascular: Major intracranial vascular flow voids are maintained. Skull and upper cervical spine: Craniocervical junction within normal limits. Extensive and innumerable marrow replacing lesions seen throughout the calvarium, skull base, and visualized upper spine, compatible with history of multiple myeloma. Most prominent of these lesions positioned in the clivus and measures approximately 2 cm in size. No other scalp soft tissue abnormality. Sinuses/Orbits: Globes and  orbital soft tissues within normal limits. Paranasal sinuses are largely clear. No significant mastoid effusion. Inner ear structures grossly normal. Other: None. IMPRESSION: 1. Acute intracranial hemorrhage involving the anterior-inferior right frontal convexity, stable from prior head CT. No associated edema or significant mass effect. 2. No other acute intracranial abnormality. 3. Innumerable marrow replacing lesions throughout the calvarium, skull base, and visualized upper spine, compatible with history of multiple myeloma. Electronically Signed   By: BJeannine BogaM.D.   On: 03/20/2020 07:35   CT CHEST ABDOMEN PELVIS W CONTRAST  Result Date: 03/20/2020 CLINICAL DATA:  Abdominal distension. EXAM: CT CHEST, ABDOMEN,  AND PELVIS WITH CONTRAST TECHNIQUE: Multidetector CT imaging of the chest, abdomen and pelvis was performed following the standard protocol during bolus administration of intravenous contrast. CONTRAST:  143m OMNIPAQUE IOHEXOL 300 MG/ML  SOLN COMPARISON:  February 14, 2020 FINDINGS: CT CHEST FINDINGS Cardiovascular: No significant vascular findings. There is mild cardiomegaly. No pericardial effusion. Mediastinum/Nodes: No enlarged mediastinal, hilar, or axillary lymph nodes. Thyroid gland, trachea, and esophagus demonstrate no significant findings. Lungs/Pleura: Mild to moderate severity areas of atelectasis and/or infiltrate are seen within the inferior aspect of the left upper lobe posterior aspect of the superior portion of the right lower lobe and posterior aspect of the bilateral lung bases. There is a very small left pleural effusion. A 5.2 cm x 1.5 cm pleural based soft tissue mass is seen along the anterior aspect of the left upper lobe (axial CT image 22, CT series number 3). A 1.4 cm pleural based soft tissue nodule is seen within this region on the prior study. Musculoskeletal: Chronic anterior second, third and fourth left rib fractures are noted. A chronic fracture of the right  scapula is also seen. Numerous lytic lesions are seen throughout the thoracic spine with a chronic pathologic fractures noted at the levels of T6 and T9 vertebral bodies. These are seen on the prior study. CT ABDOMEN PELVIS FINDINGS Hepatobiliary: A 9 mm cystic appearing area is seen within the medial aspect of the right lobe of the liver. No gallstones, gallbladder wall thickening, or biliary dilatation. Pancreas: Unremarkable. No pancreatic ductal dilatation or surrounding inflammatory changes. Spleen: Normal in size without focal abnormality. Adrenals/Urinary Tract: Adrenal glands are unremarkable. Kidneys are normal in size, without renal or hydronephrosis. A 1.3 cm simple cyst is seen within the anteromedial aspect of the upper left kidney. Bladder is unremarkable. Stomach/Bowel: There is a small hiatal hernia. Appendix appears normal. No evidence of bowel wall thickening, distention, or inflammatory changes. A moderate amount of stool is seen within the distal sigmoid colon and rectum. Vascular/Lymphatic: No significant vascular findings are present. No enlarged abdominal or pelvic lymph nodes. Reproductive: The prostate gland is markedly enlarged. Other: A 4.0 cm x 3.2 cm fat-containing right scrotal hernia is noted. No abdominopelvic ascites. Musculoskeletal: Numerous lytic lesions are seen throughout the lumbar spine and pelvis. IMPRESSION: 1. Mild to moderate severity bilateral areas of atelectasis and/or infiltrate. 2. 5.2 cm x 1.5 cm pleural based soft tissue mass along the anterior aspect of the left upper lobe. A 1.4 cm pleural based soft tissue nodule is seen within this region on the prior study. These findings are concerning for the presence of a primary lung malignancy. Further evaluation with a nuclear medicine PET/CT is recommended. 3. Findings consistent with multiple myeloma versus diffuse osseous metastasis, with chronic pathologic fractures at the levels of T6 and T9 vertebral bodies. 4.  Markedly enlarged prostate gland. 5. 4.0 cm x 3.2 cm fat-containing right scrotal hernia. 6. Mild cardiomegaly. 7. Small hiatal hernia. 8. Aortic atherosclerosis. 9. Small simple cyst within the left kidney. Aortic Atherosclerosis (ICD10-I70.0). Electronically Signed   By: TVirgina NorfolkM.D.   On: 03/20/2020 01:09   IR Removal Tun Cv Cath W/O FL  Result Date: 03/01/2020 INDICATION: Patient with history of multiple myeloma with acute kidney injury and placement of tunneled right internal jugular HD catheter on 02/17/2020; now with improving creatinine and urine output. Request received for HD catheter removal. EXAM: REMOVAL TUNNELED CENTRAL VENOUS CATHETER MEDICATIONS: None ANESTHESIA/SEDATION: None FLUOROSCOPY TIME:  None COMPLICATIONS: None immediate. PROCEDURE: Informed  written consent was obtained from the patient after a thorough discussion of the procedural risks, benefits and alternatives. All questions were addressed. Maximal Sterile Barrier Technique was utilized including caps, mask, sterile gowns, sterile gloves, sterile drape, hand hygiene and skin antiseptic. A timeout was performed prior to the initiation of the procedure. The patient's right chest and catheter was prepped and draped in a normal sterile fashion. Heparin was removed from both ports of catheter. Using gentle manual traction the cuff of the catheter was exposed and the catheter was removed in it's entirety. Pressure was held till hemostasis was obtained. A sterile dressing was applied. The patient tolerated the procedure well with no immediate complications. IMPRESSION: Successful catheter removal as described above. Read by: Rowe Robert, PA-C Electronically Signed   By: Jacqulynn Cadet M.D.   On: 03/01/2020 13:40   DG Chest Port 1 View  Result Date: 03/23/2020 CLINICAL DATA:  Difficulty breathing EXAM: PORTABLE CHEST 1 VIEW COMPARISON:  03/23/2019 FINDINGS: Cardiac shadow is mildly prominent but stable. Patchy airspace  opacity is again noted bilaterally but improved when compared with the prior exam. No sizable effusion or pneumothorax is noted. No bony abnormality is seen. IMPRESSION: Improving aeration bilaterally. Electronically Signed   By: Inez Catalina M.D.   On: 03/23/2020 07:37   DG CHEST PORT 1 VIEW  Result Date: 03/22/2020 CLINICAL DATA:  Rapid response EXAM: PORTABLE CHEST 1 VIEW COMPARISON:  03/21/2020, 03/30/2020, 02/25/2020 FINDINGS: Mildly diminished lung volumes. Mild cardiomegaly. Left greater than right airspace disease. No pneumothorax. IMPRESSION: Patchy left greater than right airspace disease concerning for pneumonia. Findings appear slightly worsened. Electronically Signed   By: Donavan Foil M.D.   On: 03/22/2020 00:47   DG CHEST PORT 1 VIEW  Result Date: 03/21/2020 CLINICAL DATA:  Shortness of breath EXAM: PORTABLE CHEST 1 VIEW COMPARISON:  March 19, 2020 chest radiograph and chest CT March 20, 2020 FINDINGS: There is persistent atelectatic change in left mid lung and left base regions. There is also atelectatic change in the right mid lung and right base regions, new. There may be superimposed pneumonia in the left mid lung. There is a small left pleural effusion. Heart is upper normal in size with pulmonary vascularity normal. No adenopathy. Old healed rib trauma on the left, better seen by CT. IMPRESSION: Areas of atelectatic change bilaterally, overall increased bilaterally. Question superimposed pneumonia left mid lung. Small left pleural effusion. Stable cardiac silhouette. Electronically Signed   By: Lowella Grip III M.D.   On: 03/21/2020 08:09   DG CHEST PORT 1 VIEW  Result Date: 02/25/2020 CLINICAL DATA:  Left-sided rib pain, back pain EXAM: PORTABLE CHEST 1 VIEW COMPARISON:  01/26/2020 FINDINGS: Single frontal view of the chest demonstrates a stable cardiac silhouette. Left lower lobe consolidation is seen within the retrocardiac region. No effusion or pneumothorax. Right  internal jugular dialysis catheter tip overlies superior vena cava. Small lytic lesions are seen throughout the thoracic cage and left scapula. The lytic thoracic spine lesion seen on recent CT are more difficult to appreciate on portable x-ray. IMPRESSION: 1. Dense left lower lobe consolidation, favor atelectasis. 2. Diffuse lytic lesions throughout the visualized bony structures, consistent with multiple myeloma. Electronically Signed   By: Randa Ngo M.D.   On: 02/25/2020 17:52   DG Abd Portable 1V  Result Date: 04/02/2020 CLINICAL DATA:  Fever EXAM: X-RAY ABDOMEN 1 VIEW COMPARISON:  03/13/2020 FINDINGS: The bowel gas pattern is nonspecific with gaseous distention of loops of small bowel and colon  scattered throughout the abdomen. There is an above average amount of stool throughout the ascending colon. There is no definite pneumatosis or free air. No radiopaque kidney stones. IMPRESSION: 1. Nonspecific bowel gas pattern with gaseous distention of loops of small bowel and colon scattered throughout the abdomen. 2. Above average amount of stool throughout the ascending colon. This is not significantly changed from 03/13/2020. Electronically Signed   By: Constance Holster M.D.   On: 03/27/2020 17:46   EEG adult  Result Date: 03/24/2020 Lora Havens, MD     03/24/2020 11:15 AM Patient Name: Trevor Shelton MRN: [id number] Epilepsy Attending: Lora Havens Referring Physician/Provider: Dr Roland Rack Date: 03/24/2020 Duration: 22.49 mins Patient history: 54yo M with seizure like episode. EEG to evaluate for seizure. Level of alertness: Awake AEDs during EEG study: LEV Technical aspects: This EEG study was done with scalp electrodes positioned according to the 10-20 International system of electrode placement. Electrical activity was acquired at a sampling rate of _0  and reviewed with a high frequency filter of _1  and a low frequency filter of _2 . EEG data were recorded continuously and  digitally stored. Description: No posterior dominant rhythm was seen. EEG showed continuous generalized 3 to 6 Hz theta-delta slowing. Significant eye flutter artifact was also noted. Hyperventilation and photic stimulation were not performed.   ABNORMALITY -Continuous slow, generalized IMPRESSION: This study is suggestive of moderate diffuse encephalopathy, nonspecific etiology. No seizures or epileptiform discharges were seen throughout the recording. Lora Havens   ECHOCARDIOGRAM COMPLETE  Result Date: 03/21/2020    ECHOCARDIOGRAM REPORT   Patient Name:   Trevor Shelton Date of Exam: 03/21/2020 Medical Rec #:  [id number]   Height:       63.0 in Accession #:    [id number]  Weight:       126.5 lb Date of Birth:  10-16-1965    BSA:          1.592 m Patient Age:    64 years    BP:           133/99 mmHg Patient Gender: M           HR:           112 bpm. Exam Location:  Inpatient Procedure: 2D Echo, Color Doppler and Cardiac Doppler Indications:    Bacteremia  History:        Patient has no prior history of Echocardiogram examinations.                 Risk Factors:Hypertension, Diabetes and Dyslipidemia.  Sonographer:    Raquel Sarna Senior RDCS Referring Phys: [id number]  Sonographer Comments: Poor apical window due to restricted mobility. IMPRESSIONS  1. Left ventricular ejection fraction, by estimation, is 70 to 75%. The left ventricle has hyperdynamic function. The left ventricle has no regional wall motion abnormalities. There is mild left ventricular hypertrophy. Left ventricular diastolic parameters are consistent with Grade I diastolic dysfunction (impaired relaxation).  2. Right ventricular systolic function is normal. The right ventricular size is normal.  3. Left atrial size was moderately dilated.  4. The mitral valve is abnormal. Trivial mitral valve regurgitation.  5. The aortic valve is tricuspid. Aortic valve regurgitation is not visualized.  6. The inferior vena cava is normal in size with greater  than 50% respiratory variability, suggesting right atrial pressure of 3 mmHg. Conclusion(s)/Recommendation(s): No evidence of valvular vegetations on this transthoracic echocardiogram. Would recommend a transesophageal echocardiogram to exclude infective endocarditis if clinically indicated.  FINDINGS  Left Ventricle: Left ventricular ejection fraction, by estimation, is 70 to 75%. The left ventricle has hyperdynamic function. The left ventricle has no regional wall motion abnormalities. The left ventricular internal cavity size was normal in size. There is mild left ventricular hypertrophy. Left ventricular diastolic parameters are consistent with Grade I diastolic dysfunction (impaired relaxation). Indeterminate filling pressures. Right Ventricle: The right ventricular size is normal. No increase in right ventricular wall thickness. Right ventricular systolic function is normal. Left Atrium: Left atrial size was moderately dilated. Right Atrium: Right atrial size was normal in size. Pericardium: There is no evidence of pericardial effusion. Mitral Valve: The mitral valve is abnormal. There is mild thickening of the mitral valve leaflet(s). Trivial mitral valve regurgitation. Tricuspid Valve: The tricuspid valve is grossly normal. Tricuspid valve regurgitation is not demonstrated. Aortic Valve: The aortic valve is tricuspid. Aortic valve regurgitation is not visualized. Pulmonic Valve: The pulmonic valve was grossly normal. Pulmonic valve regurgitation is not visualized. Aorta: The aortic root and ascending aorta are structurally normal, with no evidence of dilitation. Venous: The inferior vena cava is normal in size with greater than 50% respiratory variability, suggesting right atrial pressure of 3 mmHg. IAS/Shunts: No atrial level shunt detected by color flow Doppler.  LEFT VENTRICLE PLAX 2D LVIDd:         4.30 cm LVIDs:         2.40 cm LV PW:         1.10 cm LV IVS:        0.90 cm LVOT diam:     2.00 cm LV SV:          78 LV SV Index:   49 LVOT Area:     3.14 cm  RIGHT VENTRICLE RV S prime:     19.80 cm/s TAPSE (M-mode): 2.4 cm LEFT ATRIUM             Index       RIGHT ATRIUM           Index LA diam:        3.70 cm 2.32 cm/m  RA Area:     19.10 cm LA Vol (A2C):   46.0 ml 28.90 ml/m RA Volume:   53.30 ml  33.48 ml/m LA Vol (A4C):   82.5 ml 51.83 ml/m LA Biplane Vol: 63.7 ml 40.02 ml/m  AORTIC VALVE LVOT Vmax:   126.00 cm/s LVOT Vmean:  96.400 cm/s LVOT VTI:    0.248 m  AORTA Ao Root diam: 3.20 cm Ao Asc diam:  2.80 cm  SHUNTS Systemic VTI:  0.25 m Systemic Diam: 2.00 cm Lyman Bishop MD Electronically signed by Lyman Bishop MD Signature Date/Time: 03/21/2020/12:51:13 PM    Final    VAS [place] LOWER EXTREMITY VENOUS (DVT)  Result Date: 03/03/2020  Lower Venous DVTStudy Indications: Immobility.  Performing Technologist: Griffin Basil RCT RDMS  Examination Guidelines: A complete evaluation includes B-mode imaging, spectral Doppler, color Doppler, and power Doppler as needed of all accessible portions of each vessel. Bilateral testing is considered an integral part of a complete examination. Limited examinations for reoccurring indications may be performed as noted. The reflux portion of the exam is performed with the patient in reverse Trendelenburg.  +---------+---------------+---------+-----------+----------+--------------+ RIGHT    CompressibilityPhasicitySpontaneityPropertiesThrombus Aging +---------+---------------+---------+-----------+----------+--------------+ CFV      Full           Yes      Yes                                 +---------+---------------+---------+-----------+----------+--------------+  SFJ      Full                                                        +---------+---------------+---------+-----------+----------+--------------+ FV Prox  Full                                                         +---------+---------------+---------+-----------+----------+--------------+ FV Mid   Full                                                        +---------+---------------+---------+-----------+----------+--------------+ FV DistalFull                                                        +---------+---------------+---------+-----------+----------+--------------+ PFV      Full                                                        +---------+---------------+---------+-----------+----------+--------------+ POP      Full           Yes      Yes                                 +---------+---------------+---------+-----------+----------+--------------+ PTV      Full                                                        +---------+---------------+---------+-----------+----------+--------------+ PERO     Full                                                        +---------+---------------+---------+-----------+----------+--------------+   +---------+---------------+---------+-----------+----------+--------------+ LEFT     CompressibilityPhasicitySpontaneityPropertiesThrombus Aging +---------+---------------+---------+-----------+----------+--------------+ CFV      Full           Yes      Yes                                 +---------+---------------+---------+-----------+----------+--------------+ SFJ      Full                                                        +---------+---------------+---------+-----------+----------+--------------+  FV Prox  Full                                                        +---------+---------------+---------+-----------+----------+--------------+ FV Mid   Full                                                        +---------+---------------+---------+-----------+----------+--------------+ FV DistalFull                                                         +---------+---------------+---------+-----------+----------+--------------+ PFV      Full                                                        +---------+---------------+---------+-----------+----------+--------------+ POP      Full           Yes      Yes                                 +---------+---------------+---------+-----------+----------+--------------+ PTV      Full                                                        +---------+---------------+---------+-----------+----------+--------------+ PERO     Full                                                        +---------+---------------+---------+-----------+----------+--------------+     Summary: RIGHT: - There is no evidence of deep vein thrombosis in the lower extremity.  - No cystic structure found in the popliteal fossa.  LEFT: - There is no evidence of deep vein thrombosis in the lower extremity.  - No cystic structure found in the popliteal fossa.  *See table(s) above for measurements and observations. Electronically signed by Monica Martinez MD on 03/03/2020 at 3:26:05 PM.    Final     ASSESSMENT & PLAN Trevor Shelton 54 y.o. male with medical history significant for HTN and HLD who presented with back pain and was found to have cord compression and lytic lesions concerning for multiple myeloma. The patient's imaging and blood work are most concerning for multiple myeloma.  Interestingly the M spike and the serum free light chain ratio is relatively modest compared to the extent of the damage seen on CT scan.  The patient had significant hypercalcemia upon presentation. Bone marrow biopsy was performed on 02/19/2020 and was consistent with a plasma  cell neoplasm.  Cytogenetics were normal.  He received radiation for impending cord compression which was completed on 03/02/2020.   Recommend CyBorD chemotherapy.  Since the patient was admitted to the inpatient rehab unit, chemotherapy was initiated on 03/15/2020.  He was  started on Velcade and dexamethasone only with plan to add Cytoxan as an outpatient.  SPEP 02/15/2020: M protein 0.9 SFLC: kappa 37.5, Lambda 9.9, ratio 3.79 UPEP: 02/17/2020: M spike not observed.  Immunofixation showed IgG monoclonal protein with kappa light chain specificity   # Lytic Lesions Througout Skeleton/Hypercalcemia # Pathologic Fractures of Spine/Soft Tissue Masses in Spine #Concern for Multiple Myeloma --bone marrow biopsy performed 02/19/2020 to confirm diagnosis; results consistent with plasma cell neoplasm.  Normal cytogenetics. --M protein and Kappa ratio are modestly elevated compared the the extent of damage seen on CT/MRI. 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  We will continue to closely monitor and consider additional bisphosphonate therapy as an outpatient. --The patient has completed radiation to the impending cord compression.  Per radiation oncology, we are tapering dexamethasone.  Recommended taper is for dexamethasone 4 mg every 12 hours x1 week then 2 mg every 12 hours x1 week, and then 2 mg daily for 1 week, and then 2 mg every other day x2 weeks and then stop.  He started dexamethasone 2 mg every 12 hours on 03/18/2020.  Will reduce to 2 mg every other day x2 weeks starting on 03/26/2020.  He is not reliably taking p.o. medications at this time and I have ordered his dexamethasone to be IV.  Will transition to p.o. when reliably taking medications by mouth. --The patient was started on CyBorD chemotherapy.  Velcade and dexamethasone were given on 9/7.  We had plan to administer his second dose of Velcade on 9/14, but given acute change in status and MSSA bacteremia, chemotherapy remains on hold. --Continue acyclovir 400 mg daily.  #MSSA bacteremia --From possible pneumonia. --Antibiotics per infectious  disease.  #Thrombocytopenia --Likely due to underlying multiple myeloma and recent Velcade treatment although Velcade is not known to cause significant pancytopenia.  Could also be due to acute infection/bacteremia. --Continue supportive therapy with platelet transfusion for platelet count less than 20,000 or if he has severe, active clinical bleeding. --Recommend saline nasal spray and Afrin for intermittent epistaxis. --HIT panel not indicative of HIT.  Likely due to bacteremia/acute infection.  Counts are now improving. --Continue to hold anticoagulation.  #Anemia --Hemoglobin improving and up to 8.9 today --Anemia likely due to underlying malignancy and recent Velcade although Velcade not known to cause significant pancytopenia.  Also due to acute infection. --Monitor hemoglobin and transfuse PRBCs for hemoglobin less than 7.  #Pleural-based soft tissue mass and markedly enlarged prostate noted on recent CT --Etiology of pleural-based soft tissue mass is unclear-could be plasmacytoma versus a primary lung malignancy --Also noted to have significant prostatomegaly on CT scan --PSA normal --The patient will need a biopsy of his pleural-based mass by IR once he is medically stable and once platelet count has improved to the point that IR will perform this procedure. --Discussed need for biopsy with the patient and his sister today.  Platelet count is up to 50,000 and slowly improving.  I anticipate that IR would like his platelet count to be higher than this to proceed with biopsy.  I will go ahead and enter in interventional radiology eval and  management order to determine timing of biopsy.  Mikey Bussing, DNP, AGPCNP-BC, AOCNP Mon/Tues/Thurs/Fri 7am-5pm; Off Wednesdays Cell: [phone]  03/24/2020 2:39 PM

## 2020-03-24 NOTE — Progress Notes (Signed)
Interventional Radiology Brief Note:  Trevor Shelton is a 54 year old male known to IR from recent bone marrow biopsy 02/19/20 confirming multiple myeloma as well as tunneled HD catheter placement 02/17/20 and removal 03/01/20 after resolve of AKI.  Unfortunately, Trevor Shelton has had multiple set backs during his admission and rehabilitation including nose bleed s/p 2u PRBCs, fevers with positive blood cultures for MSSA, concern for endocarditis, and an incidentally found parenchymal hemorrhage.  He is now readmitted in the ICU with encephalopathy, ongoing treatment for MSSA bacteremia, and thrombocytopenia. Continues to have significant pain related to his many bony lesions.  Recent CT imaging showed a possible plasmocytoma vs. New primary lung lesion/carcinoma.   IR has been consulted for biopsy of the lesion.   Case reviewed by Dr. 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 Infection can be attempted once patient is stable.   Currently is undergoing work-up for abdominal distention with planned CT Abdomen/Pelvis this afternoon.   Monitor clinical course through the weekend for stabilization and thrombocytopenia.   Plan for possible biopsy early next week with platelet infusion if needed.   Brynda Greathouse, MS RD PA-C 4:29 PM

## 2020-03-24 NOTE — Progress Notes (Signed)
Inpatient Diabetes Program Recommendations  AACE/ADA: New Consensus Statement on Inpatient Glycemic Control (2015)  Target Ranges:  Prepandial:   less than 140 mg/dL      Peak postprandial:   less than 180 mg/dL (1-2 hours)      Critically ill patients:  140 - 180 mg/dL   Lab Results  Component Value Date   GLUCAP 212 (H) 03/24/2020   HGBA1C 6.5 (H) 02/18/2020    Review of Glycemic Control Results for LYCAN, DAVEE (MRN [id number]) as of 03/24/2020 11:32  Ref. Range 03/23/2020 11:06 03/23/2020 15:15 03/24/2020 07:14 03/24/2020 11:11  Glucose-Capillary Latest Ref Range: 70 - 99 mg/dL 303 (H) 264 (H) 274 (H) 212 (H)   Current Orders: Lantus 25 units QHS                            Novolog Sensitive Correction Scale/ SSI (0-9 units) TID AC                             Novolog 3 units TID with meals    Inpatient Diabetes Program Recommendations:   Noted Decadron course completed today. Consider increasing Lantus to 28 units QHS.   Thanks, Bronson Curb, MSN, RNC-OB Diabetes Coordinator [phone] (8a-5p)

## 2020-03-24 NOTE — Progress Notes (Signed)
At shift change patient called out and this RN entered room with Scientist, product/process development. On entering patient was staring straight ahead and not responsive to staff, interpreter initiated and wife brought on phone to speak to patient who patient did not respond to either. Patient continues to stare directly ahead blinking. Patient does have some purposeful movement of extremities (making sign of the cross) but does not respond to or acknowledge staff. Dr. Leonel Ramsay paged and to bedside to evaluate, stat eeg ordered.  Candy Sledge, RN

## 2020-03-25 ENCOUNTER — Inpatient Hospital Stay (HOSPITAL_COMMUNITY): Payer: Self-pay

## 2020-03-25 ENCOUNTER — Encounter (HOSPITAL_COMMUNITY): Admission: AD | Disposition: E | Payer: Self-pay | Source: Ambulatory Visit | Attending: Internal Medicine

## 2020-03-25 LAB — CBC WITH DIFFERENTIAL/PLATELET
Abs Immature Granulocytes: 0.32 10*3/uL — ABNORMAL HIGH (ref 0.00–0.07)
Basophils Absolute: 0 10*3/uL (ref 0.0–0.1)
Basophils Relative: 0 %
Eosinophils Absolute: 0 10*3/uL (ref 0.0–0.5)
Eosinophils Relative: 0 %
HCT: 23.8 % — ABNORMAL LOW (ref 39.0–52.0)
Hemoglobin: 8.1 g/dL — ABNORMAL LOW (ref 13.0–17.0)
Immature Granulocytes: 7 %
Lymphocytes Relative: 8 %
Lymphs Abs: 0.4 10*3/uL — ABNORMAL LOW (ref 0.7–4.0)
MCH: 29.5 pg (ref 26.0–34.0)
MCHC: 34 g/dL (ref 30.0–36.0)
MCV: 86.5 fL (ref 80.0–100.0)
Monocytes Absolute: 0.2 10*3/uL (ref 0.1–1.0)
Monocytes Relative: 3 %
Neutro Abs: 4 10*3/uL (ref 1.7–7.7)
Neutrophils Relative %: 82 %
Platelets: 40 10*3/uL — ABNORMAL LOW (ref 150–400)
RBC: 2.75 MIL/uL — ABNORMAL LOW (ref 4.22–5.81)
RDW: 13.7 % (ref 11.5–15.5)
WBC: 4.9 10*3/uL (ref 4.0–10.5)
nRBC: 2.1 % — ABNORMAL HIGH (ref 0.0–0.2)

## 2020-03-25 LAB — GLUCOSE, CAPILLARY
Glucose-Capillary: 189 mg/dL — ABNORMAL HIGH (ref 70–99)
Glucose-Capillary: 183 mg/dL — ABNORMAL HIGH (ref 70–99)
Glucose-Capillary: 258 mg/dL — ABNORMAL HIGH (ref 70–99)
Glucose-Capillary: 185 mg/dL — ABNORMAL HIGH (ref 70–99)

## 2020-03-25 LAB — COMPREHENSIVE METABOLIC PANEL
ALT: 15 U/L (ref 0–44)
AST: 28 U/L (ref 15–41)
Albumin: 1.6 g/dL — ABNORMAL LOW (ref 3.5–5.0)
Alkaline Phosphatase: 79 U/L (ref 38–126)
Anion gap: 8 (ref 5–15)
BUN: 41 mg/dL — ABNORMAL HIGH (ref 6–20)
CO2: 19 mmol/L — ABNORMAL LOW (ref 22–32)
Calcium: 8.8 mg/dL — ABNORMAL LOW (ref 8.9–10.3)
Chloride: 100 mmol/L (ref 98–111)
Creatinine, Ser: 1.23 mg/dL (ref 0.61–1.24)
GFR calc Af Amer: >60 mL/min (ref >60)
GFR calc non Af Amer: >60 mL/min (ref >60)
Glucose, Bld: 205 mg/dL — ABNORMAL HIGH (ref 70–99)
Potassium: 3.7 mmol/L (ref 3.5–5.1)
Sodium: 127 mmol/L — ABNORMAL LOW (ref 135–145)
Total Bilirubin: 0.5 mg/dL (ref 0.3–1.2)
Total Protein: 5.6 g/dL — ABNORMAL LOW (ref 6.5–8.1)

## 2020-03-25 SURGERY — ECHOCARDIOGRAM, TRANSESOPHAGEAL
Anesthesia: Monitor Anesthesia Care

## 2020-03-25 MED ORDER — MORPHINE SULFATE (PF) 2 MG/ML IV SOLN
1.0000 mg | INTRAVENOUS | Status: DC | PRN
  Administered 2020-03-25 – 2020-03-26 (×4): 1 mg via INTRAVENOUS
  Filled 2020-03-25 (×5): qty 1

## 2020-03-25 NOTE — Progress Notes (Signed)
STROKE TEAM PROGRESS NOTE      INTERVAL HISTORY .  Patient is sitting up in bed with his wife at the bedside.  Spanish-speaking video interpreter interprets for him.  He doing much better in terms of behavioral standpoint without any further episodes of unusual behavior or seizure activity.  TEE has been canceled as ID feels strongly that is no longer necessary.  Neurological exam is unchanged..  Vital signs are stable.  Platelet count remains low at 40,000  OBJECTIVE Vitals:   03/18/2020 1400 03/09/2020 1500 03/27/2020 1600 03/11/2020 1700  BP: 128/84 120/78 112/75 123/84  Pulse: (!) 105 99 94 97  Resp:  (!) 25 17 (!) 21  Temp:   97.9 F (36.6 C)   TempSrc:   Oral   SpO2: 98% 98% 97% 99%  Weight:        CBC:  Recent Labs  Lab 03/24/20 1018 03/22/2020 0704  WBC 5.1 4.9  NEUTROABS 4.6 4.0  HGB 8.9* 8.1*  HCT 25.7* 23.8*  MCV 85.1 86.5  PLT 50* 40*    Basic Metabolic Panel:  Recent Labs  Lab 03/23/20 0607 03/23/20 0607 03/24/20 1018 04/02/2020 0704  NA 129*   < > 130* 127*  K 4.0   < > 3.8 3.7  CL 102   < > 102 100  CO2 19*   < > 16* 19*  GLUCOSE 266*   < > 241* 205*  BUN 40*   < > 38* 41*  CREATININE 0.94   < > 1.12 1.23  CALCIUM 8.2*   < > 8.6* 8.8*  MG 1.7  --   --   --   PHOS 2.7  --   --   --    < > = values in this interval not displayed.    Lipid Panel:     Component Value Date/Time   CHOL 175 02/25/2020 0340   TRIG 142 02/25/2020 0340   HDL 38 (L) 02/25/2020 0340   CHOLHDL 4.6 02/25/2020 0340   VLDL 28 02/25/2020 0340   LDLCALC 109 (H) 02/25/2020 0340   HgbA1c:  Lab Results  Component Value Date   HGBA1C 6.5 (H) 02/18/2020   Urine Drug Screen: No results found for: LABOPIA, COCAINSCRNUR, LABBENZ, AMPHETMU, THCU, LABBARB  Alcohol Level No results found for: [facility]  IMAGING  DG Chest 2 View 03/29/2020 IMPRESSION:  Areas of patchy atelectasis bilaterally. No edema or airspace opacity. Cardiac silhouette within normal limits.   CT HEAD WO  CONTRAST 03/20/2020 IMPRESSION:  1. Small lobulated hyperdensity immediately subjacent to the right frontal calvarium as above. Highly suspicious for a small bleed. No significant mass effect or edema.  2. Multiple scattered lucent lesions throughout the calvarium and visualized upper cervical spine, consistent with history of multiple myeloma.  3. No other acute intracranial abnormality.   Repeat CT HEAD WO CONTRAST 03/21/2020-trace extra-axial hemorrhage in the right frontal lobe no new site of hemorrhage.   MR BRAIN W WO CONTRAST 03/20/2020 IMPRESSION:  1. Acute intracranial hemorrhage involving the anterior-inferior right frontal convexity, stable from prior head CT. No associated edema or significant mass effect.  2. No other acute intracranial abnormality.  3. Innumerable marrow replacing lesions throughout the calvarium, skull base, and visualized upper spine, compatible with history of multiple myeloma.   CT CHEST ABDOMEN PELVIS W CONTRAST 03/20/2020 IMPRESSION:  1. Mild to moderate severity bilateral areas of atelectasis and/or infiltrate.  2. 5.2 cm x 1.5 cm pleural based soft tissue mass along the anterior  aspect of the left upper lobe. A 1.4 cm pleural based soft tissue nodule is seen within this region on the prior study. These findings are concerning for the presence of a primary lung malignancy. Further evaluation with a nuclear medicine PET/CT is recommended.  3. Findings consistent with multiple myeloma versus diffuse osseous metastasis, with chronic pathologic fractures at the levels of T6 and T9 vertebral bodies.  4. Markedly enlarged prostate gland.  5. 4.0 cm x 3.2 cm fat-containing right scrotal hernia.  6. Mild cardiomegaly.  7. Small hiatal hernia.  8. Aortic atherosclerosis.  9. Small simple cyst within the left kidney.  Aortic Atherosclerosis (ICD10-I70.0).   DG Abd Portable 1V 03/31/2020 IMPRESSION:  1. Nonspecific bowel gas pattern with gaseous distention of  loops of small bowel and colon scattered throughout the abdomen.  2. Above average amount of stool throughout the ascending colon.   Bilateral Lower Extremity Venous Dopplers - 03/03/20 - negative for DVT  ECG - ST rate 126 BPM. (See cardiology reading for complete details)  PHYSICAL EXAM Blood pressure 123/84, pulse 97, temperature 97.9 F (36.6 C), temperature source Oral, resp. rate (!) 21, weight 58 kg, SpO2 99 %. Mildly obese middle-aged Hispanic male not in distress. . Afebrile. Head is nontraumatic. Neck is supple without bruit.    Cardiac exam no murmur or gallop. Lungs are clear to auscultation. Distal pulses are well felt.  There is mild healed blood over both upper and lower lips. Right shoulder elevation limited due to pain Neurological Exam:   He is awake alert seems oriented to time place and person.  Speech appears clear without dysarthria.  Extraocular movements are full range without nystagmus.  He follows simple midline and one-step commands well.  No facial weakness.  Tongue midline. Motor system exam able to move all 4 extremities equally well against gravity without focal weakness.  Shoulder elevation limited due to pain on the right.  Sensation intact bilaterally.  Reflexes symmetric.  Plantars downgoing.  Gait not tested. ASSESSMENT/PLAN [name] is a 54 y.o. male with history of hypertension, AKI, DM, hypercalcemia, and hyperlipidemia recently diagnosed with metastatic multiple myeloma complicated by lytic lesions of the spine, as well as other painful lesions, treated with radiation of spine lesions due to concern for impending cord compression and CyBorD chemotherapy with prophylactic acyclovir, new possible primary lung malignancy, recent epistaxis requiring transfusion for pancytopenia, a head CT performed for AMS demonstrated a lesion in the right frontal lobe concerning for possible hemorrhage prompting a neurology consult. Recent symptoms include blurred vision,  lower extremity weakness, incontinence of urine more than bowel, significant shoulder, back, and leg pain, as well as decreased sensation in the sacral/genital region. He did not receive IV t-PA due to hemorrhage.   Stroke: Acute intracranial hemorrhage involving the anterior-inferior right frontal convexity-likely due to thrombocytopenia from effect of multiple myeloma and chemotherapy for its treatment.-Doubt this represents hypertensive, hemorrhagic infarct or septic emboli  Resultant no focal neuro deficits  Code Stroke CT Head - not ordered  CT head - Small lobulated hyperdensity immediately subjacent to the right frontal calvarium as above. Highly suspicious for a small bleed.  CT Head WO -decreased size of right frontal anteroinferior convexity hemorrhage.    MRI head - Acute intracranial hemorrhage involving the anterior-inferior right frontal convexity, stable from prior head CT.   MRA head - not ordered  CTA H&N - not ordered  CT Perfusion - not ordered  CT Chest Abd and Pelvus - 5.2  cm x 1.5 cm pleural based soft tissue mass along the anterior aspect of the left upper lobe. A 1.4 cm pleural based soft tissue nodule is seen within this region on the prior study. These findings are concerning for the presence of a primary lung malignancy.  Carotid Doppler - not indicated  2D Echo - not indicated  [employer] Virus 2 - negative  LDL - 109  HgbA1c - 6.5  UDS - not ordered  VTE prophylaxis - SCDs  EEG 03/22/2020 moderate diffuse encephalopathy.  No seizures Diet  Diet Order            Diet full liquid Room service appropriate? Yes; Fluid consistency: Thin  Diet effective now                 No antithrombotic prior to admission, now on No antithrombotic due to thrombocytopenia.  Consider adding aspirin 81 mg when safe as determined by hematologist  Ongoing aggressive stroke risk factor management  Therapy recommendations:  pending  Disposition:   Pending  Hypertension  Home BP meds: Norvasc ; Coreg  Current BP meds: Coreg  Stable  SBP goal < 140 mm Hg initially . Long-term BP goal normotensive  Hyperlipidemia  Home Lipid lowering medication: Zocor 20 mg daily  LDL 109, goal < 70  Current lipid lowering medication: Lipitor 20 mg daily -> will D/C - contraindicated in [place]  Continue statin at discharge  Diabetes (Decadron)  Home diabetic meds: insulin  Current diabetic meds: insulin  HgbA1c 6.5, goal < 7.0 Recent Labs    04/07/2020 0714 04/01/2020 1109 03/16/2020 1651  GLUCAP 189* 183* 258*    Other Stroke Risk Factors  Previous ETOH use  Previous drug use  Other Active Problems  Code status - Full code  Aortic Atherosclerosis (ICD10-I70.0)   AKI - creatinine - 127->124  Anemia - Hgb - 9.4 Thrombocytopenia - platelets - 38 Hyponatremia - Na - 121 Hypocalcemia - 7.9 Tachycardia - 115 - 143 - likely due to fever Fever - 102.3 - Maxipime ; Vancomycin ; Diflucan Multiple Myeloma Possible primary lung malignancy NPO - tube feedings  Hospital day # 6 The patient's altered mental status today is much improved.  Recommend continue Seroquel 12.5 mg at night to help in sleep and may increase as tolerated.  Consider starting aspirin when platelet count is high enough and okay with hematology.  Long discussion with Dr. [place] Pink as well as with patient and his wife using Spanish language video interpreter and answered questions.     Greater than 50% time during this 25-minute visit was spent on counseling and coordination of care about his intracerebral hemorrhage and thrombocytopenia and answering questions.  Long discussion with patient and wife via [place] language video interpreter and patient's RN and answered questions.  Stroke team will sign off.  Kindly call for questions.  Antony Contras, MD To contact Stroke Continuity provider, please refer to [email or web address] After hours, contact General Neurology

## 2020-03-25 NOTE — Progress Notes (Signed)
Physical Therapy Treatment Patient Details Name: Trevor Shelton MRN: [id number] DOB: [birth year] Today's Date: 03/13/2020    History of Present Illness 54 yo male with PMH of HTN and HLD.  Pt with recent admission 02/14/20-03/03/20 with neoplasm consistent with multiple myeloma with multiple spinal lesions, L rib fx 9 and 10, and cord compression.  Pt on Decadron for cord compression.  Pt was at inpt rehab and developed nose bleed and fever. Has been admitted with fever, multiple myeloma, AMS, AKI, and pancytopenia.  Pt found to have small intracrainal hemorrhage and severe sepsis with PNA 9/14 rapid response called transfer to ICU sleep cycle disturbed with visaul hallunications TEE cancelled EEG moderate diffuse encephalopathy    PT Comments    Patient with limited progress still needing +2 A for mobility, but seems to tolerate EOB a little longer and with less assist leaning back on his hands and only close guard to minguard A needed.  Patient likely needing less intense rehab venue due to pain and very slow progress.  Feel he will need SNF level rehab at d/c.  PT to continue to follow.    Follow Up Recommendations  SNF     Equipment Recommendations  Wheelchair (measurements PT);Wheelchair cushion (measurements PT);3in1 (PT)    Recommendations for Other Services       Precautions / Restrictions Precautions Precautions: Fall Precaution Comments: Significant BLE weakness and ataxia-like movements; back and rib precautions for comfort due to fxs Restrictions Weight Bearing Restrictions: No    Mobility  Bed Mobility Overal bed mobility: Needs Assistance Bed Mobility: Rolling;Sidelying to Sit Rolling: Max assist Sidelying to sit: +2 for physical assistance;Max assist Supine to sit: +2 for physical assistance;Max assist     General bed mobility comments: pt requires 1 step commands with hand over hand to initiate movements to roll toward R side. able to sustain grasp on L  side  Transfers Overall transfer level: Needs assistance Equipment used: 2 person hand held assist Transfers: Sit to/from [facility] Sit to Stand: +2 physical assistance;Total assist Stand pivot transfers: +2 physical assistance;Total assist       General transfer comment: requrires total (A) with blocking bil LE; unable to sustain extension of hips and kness and sat close to edge of chair needing assist to scoot back  Ambulation/Gait                 Stairs             Wheelchair Mobility    Modified Rankin (Stroke Patients Only) Modified Rankin (Stroke Patients Only) Pre-Morbid Rankin Score: Severe disability Modified Rankin: Severe disability     Balance Overall balance assessment: Needs assistance Sitting-balance support: Bilateral upper extremity supported;Feet supported Sitting balance-Leahy Scale: Poor Sitting balance - Comments: able to sit with S leaning back on UE's   Standing balance support: Bilateral upper extremity supported Standing balance-Leahy Scale: Zero Standing balance comment: A of 2 for stand pivot to chair                            Cognition Arousal/Alertness: Awake/alert Behavior During Therapy: WFL for tasks assessed/performed Overall Cognitive Status: Impaired/Different from baseline Area of Impairment: Attention                   Current Attention Level: Alternating           General Comments: pt answering wife questions and visually attending to MD in room during  session. pt verbalized pain in back and chest multiple times but does not ask questions of staff or express any needs. pt agreeable to OOB. pt following 1 step commands consistently. pt needs (A) To facilitation movement at times but once initiated sustain the task question more motor planning deficit than understanding of the request      Exercises Other Exercises Other Exercises: LAQ and resisted hip/knee extension up in  recliner wtih cues for slowing down extension to control and for sustaining hold on kicks    General Comments General comments (skin integrity, edema, etc.): geomat in chair for skin protection, wife present and on phone during session seemingly performing disability application      Pertinent Vitals/Pain Pain Assessment: 0-10 Pain Score: 7  Pain Location: back and chest Pain Descriptors / Indicators: Discomfort;Grimacing Pain Intervention(s): Monitored during session;Repositioned;Premedicated before session    Home Living                      Prior Function            PT Goals (current goals can now be found in the care plan section) Acute Rehab PT Goals Patient Stated Goal: to get to chair  Progress towards PT goals: Progressing toward goals (slowly and limited)    Frequency    Min 2X/week      PT Plan Discharge plan needs to be updated;Frequency needs to be updated    Co-evaluation PT/OT/SLP Co-Evaluation/Treatment: Yes Reason for Co-Treatment: For patient/therapist safety;To address functional/ADL transfers;Necessary to address cognition/behavior during functional activity PT goals addressed during session: Mobility/safety with mobility;Balance OT goals addressed during session: Strengthening/ROM      AM-PAC PT "6 Clicks" Mobility   Outcome Measure  Help needed turning from your back to your side while in a flat bed without using bedrails?: A Lot Help needed moving from lying on your back to sitting on the side of a flat bed without using bedrails?: A Lot Help needed moving to and from a bed to a chair (including a wheelchair)?: Total Help needed standing up from a chair using your arms (e.g., wheelchair or bedside chair)?: Total Help needed to walk in hospital room?: Total Help needed climbing 3-5 steps with a railing? : Total 6 Click Score: 8    End of Session Equipment Utilized During Treatment: Gait belt Activity Tolerance: Patient limited by  pain Patient left: in chair;with call bell/phone within reach;with chair alarm set;with family/visitor present Nurse Communication: Mobility status;Need for lift equipment PT Visit Diagnosis: Muscle weakness (generalized) (M62.81);Difficulty in walking, not elsewhere classified (R26.2);Other abnormalities of gait and mobility (R26.89)     Time: 1043-1110 PT Time Calculation (min) (ACUTE ONLY): 27 min  Charges:  $Therapeutic Activity: 8-22 mins                     Magda Kiel, PT Acute Rehabilitation Services HNGIT:[phone] Office:[phone] 03/13/2020    Trevor Shelton 03/22/2020, 11:37 AM

## 2020-03-25 NOTE — Progress Notes (Signed)
PROGRESS NOTE    Trevor Shelton  TZG:[id number] DOB: Jan 19, 1966 DOA: 03/10/2020 PCP: Patient, No Pcp Per   Brief Narrative: 54 year old male with history of hypertension,hyperlipidemia who was previously admitted 8/8-8/26 and then discharged to inpatient rehab.  Prior to this, found to have multiple myeloma with spinal active lesions, left rib fracture on 03/18/2017,impending cord compression and underwent radiation therapy, decadron, impending cord compression s/p CyBorD chemotherapy recently, he was on HD short term.  Following transfer to rehab, he underwent chemotherapy on 03/15/2020 and next chemotherapy was scheduled for 03/22/2020.  Patient had significant nosebleed on 03/17/2020.  He received 2 units PRBCs on 03/18/2020 due to hemoglobin 6.8.  Lovenox was stopped.  Patient spiked fevers with T-max 102 that same day. A CT chest/abdmen/pelvis was ordered but not done yet. His labs showed Na 114, CL 86, CO2 19, BUN 47, Cr 1.06, WBC 4.6 (was 2.9 on 9/10), hemoglobin 10, PLT 46 which is at his baseline, nonrevealing UA.  Covid is negative.CXR showed Areas of patchy atelectasis bilaterally. KUB showed stool burden. Patient was empirically started on vancomycin and Zosyn at inpatient rehab. he was transferred back to the hospital floor under the hospitalist service on 9/12.    CT chest/abd pelvis was done noting: 1. Mild to moderate severity bilateral areas of atelectasis and/or infiltrate. 2. 5.2 cm x 1.5 cm pleural based soft tissue mass along the anterior aspect of the left upper lobe. A 1.4 cm pleural based soft tissue nodule is seen within this region on the prior study. These findings are concerning for the presence of a primary lung malignancy. Further evaluation with a nuclear medicine PET/CT is recommended.  3. Findings consistent with multiple myeloma versus diffuse osseous metastasis, with chronic pathologic fractures at the levels of T6 and T9 vertebral bodies. 4. Markedly enlarged prostate  gland. 5. 4.0 cm x 3.2 cm fat-containing right scrotal hernia.  03/20/20: MSSA bacteremia- abx de-escalated to Ancef, Dr. Johnnye Sima ID consulted.  Also found to have a small intracranial hemorrhage seen by neurology, MRI brain and repeat CT.  6 hours later which showed decrease in size of conspicuity of unknown focus of intracranial hemorrhage.  Patient was managed on Percocet. Also seen by oncologist 9/13: Ongoing epistaxis and plt down to 25k- 1 unit platelet transfusion ongoing oncology 9.13-14 overnight patient was more lethargic and less responsive also having trouble breathing possible seizure seen by neurology repeat CT head no acute finding, loaded with Keppra transferred to ICU 4N and was transferred he has been more alert/awake. EEG -moderate diffuse encephalopathy, nonspecific, no seizures or epileptiform discharges.  Patient was monitored in ICU 24 hours until 9/15.  At this point, being followed by neurology, oncology and infectious disease.  Has completed radiation therapy and is on dexamethasone taper.  Continued on chemotherapy although on hold due to MSSA bacteremia.  At some point, interventional radiology planning to do pleural mass biopsy  The past day or 2, patient has been complaining of some midepigastric pain, hurts more with deep inspiration.   Assessment & Plan:  Stroke with acute intracranial hemorrhage involving the anterior-inferior right frontal convexity likely due to thrombocytopenia from effect of multiple myeloma and chemotherapy.  Repeat CT head decreased size of right frontal anterior inferior convexity motors, MRI.  Acute intracranial hemorrhage stable.  Seen by neurology no further recommendation.  LDL 109 hemoglobin A1c 6.5, 2D echo and carotid 2D echo not indicated for cva.  Staring episode this morning and stat EEG obtain unremarkable, neurology following closely.  He is on Keppra.  Seems to have resolved.  Severe Sepsis due to MSSA bacteremia: Suspecting  hospital-acquired.  Patient immunocompromised with recent chemo, multiple myeloma but no central line, source could be pneumonia.  ID following, currently on Ancef, acyclovir prophylaxis.  Fortunately, does not need TEE. Marland Kitchen Repeat blood cultures 9/13 NGTD. Respiratory virus panel COVID-19 MRSA .  Acute hypoxic respiratory failure/ pneumonia with chest x-ray showing areas of atelectasis bilaterally.  Doing well on room air.  Continue antibiotics for pneumonia incentive spirometry and PT OT  Metastatic multiple myeloma:treated with radiation of spine due to concern for impending cord compression- MRI 8.8.21: Epidural tumor extension at T6 and T9 and T6 lesion severely narrowed thecal sac and deformed the spinal cord . He had velcade and dexamethasone, acyclovir, diflucan.  Oncology following closely continue current plan.  Chemotherapy on hold given acute infection  Pancytopenia/Thrombcytopenia-secondary to patient's recent chemotherapy with Velcade and dexamethasone 03/15/2020.  S/p 1 unit platelet transfusion 9/13.  CBC uptrending nicely.  Continue to monitor  Recent Labs  Lab 03/23/20 0607 03/24/20 1018 03/27/2020 0704  HGB 7.8* 8.9* 8.1*  HCT 23.2* 25.7* 23.8*  WBC 3.4* 5.1 4.9  PLT 41* 50* 40*   Anemia from multiple myeloma/chemo: Also acute blood loss with epistaxis: Patient had 2 units PRBC 9/10 for hemoglobin of 6.8.  H&H improved 8.9 g today.   Epistaxis-resolved.  Continue Afrin-change to prn  Acute encephalopathy multifatorial-likely multifactorial in the setting of sepsis, stroke.mri brain w/ small bleed, and also w/ sepsis, hyponatremia.  Appears much more alert awake.  There is question of seizure so was transferred to neuro ICU 9/14 and is on Keppra.EEG showing moderate encephalopathy no epileptiform discharges.  On 9/15, had a staring episode seen by neurology and EEG stat no acute finding.  Has improved since then.  Chest pain/diffuse body pain likely from likely from myeloma.   Serial troponin negative continue pain control.    Left upper lobe with 5.2/1.5 cm pleural based mass- concern for primary lung malignancy per CT. followed by oncology closely.  Interventional radiology planning for pleural biopsy, hopefully Monday.  Hyponatremia: Likely hypovolemic.  TSH cortisol okay.  Sodium improved to 130 corrected sodium si better. Off NS IV fluids, nephrology signed off.   Recent Labs  Lab 03/21/20 2259 03/22/20 0101 03/23/20 0607 03/24/20 1018 03/09/2020 0704  NA 124* 125* 129* 130* 127*   AKI/Short term HD in august 2021: Renal function is stable Recent Labs  Lab 03/21/20 2259 03/22/20 0101 03/23/20 0607 03/24/20 1018 04/01/2020 0704  BUN 41* 40* 40* 38* 41*  CREATININE 1.06 1.02 0.94 7.51 0.25   Metabolic acidosis:Bicarb at 19.Monitor  T2DM-newly diagnosed in august 2021. a1c 6.5.  Blood sugar poorly controlled still will increase Lantus further to 28 units , keep on sliding scale and monitor and adjust. Recent Labs  Lab 03/24/20 1529 03/24/20 2128 03/24/20 2303 04/05/2020 0714 03/27/2020 1109  GLUCAP 258* 223* 215* 189* 183*   Chronic constipation-stable. Had BM 9/15 Abdominal distention this morning will obtain CT abdomen pelvis without contrast.  Had bowel movement yesterday  HTN/HLD- bp meds amlodipine on hold, on coreg low dose and is on Midodrine- BP stable  Markedly enlarged prostate in CT: PSA normal 1.5  Type 7  Stool charted-C. difficile unable to be sent no diarrhea. Off    Weakness/debility/physical deconditioning: Continue PT OT, suspect multifactorial in the setting of multiple illness new cancer chemotherapy.  Return to inpatient rehab eventually.  DVT prophylaxis: SCDs Start: 03/20/20  0020 Code Status:   Code Status: Full Code  Family Communication: Plan of care discussed with patient at bedside with [place] interpreter.  Status is: Inpatient Remains inpatient appropriate because:Ongoing diagnostic testing needed not appropriate  for outpatient work up and Remains hospitalized due to ongoing management of MSSA sepsis, pancytopenia  Dispo: The patient is from: [facility]              Anticipated d/c is to: TBD.  Transfer to medical floor              Anticipated d/c date is: > 3 days              Patient currently is not medically stable to d/c. Nutrition: Diet Order            Diet full liquid Room service appropriate? Yes; Fluid consistency: Thin  Diet effective now                Body mass index is 22.65 kg/m.  Consultants:see note  Procedures: see note.  CT chest abd pelvis w/ contrast: Mild to moderate severity bilateral areas of atelectasis and/or infiltrate. 2. 5.2 cm x 1.5 cm pleural based soft tissue mass along the anterior aspect of the left upper lobe. A 1.4 cm pleural based soft tissue nodule is seen within this region on the prior study. These findings are concerning for the presence of a primary lung malignancy. Further evaluation with a nuclear medicine PET/CT is recommended. 3. Findings consistent with multiple myeloma versus diffuse osseous metastasis, with chronic pathologic fractures at the levels of T6 and T9 vertebral bodies. 4. Markedly enlarged prostate gland. 5. 4.0 cm x 3.2 cm fat-containing right scrotal hernia. 6. Mild cardiomegaly. 7. Small hiatal hernia. 8. Aortic atherosclerosis. 9. Small simple cyst within the left kidney.  Microbiology:see note Blood Culture    Component Value Date/Time   SDES BLOOD RIGHT HAND 03/21/2020 1359   SDES BLOOD LEFT HAND 03/21/2020 1359   SPECREQUEST  03/21/2020 1359    BOTTLES DRAWN AEROBIC AND ANAEROBIC Blood Culture results may not be optimal due to an excessive volume of blood received in culture bottles   SPECREQUEST  03/21/2020 1359    BOTTLES DRAWN AEROBIC AND ANAEROBIC Blood Culture results may not be optimal due to an excessive volume of blood received in culture bottles   CULT  03/21/2020 1359    NO GROWTH 4 DAYS Performed at  [facility], [place] [address] [place], [place] [address]    CULT  03/21/2020 1359    NO GROWTH 4 DAYS Performed at [facility], [place] [address], [place], [place] [address]    REPTSTATUS PENDING 03/21/2020 1359   REPTSTATUS PENDING 03/21/2020 1359    Other culture-see note  Medications: Scheduled Meds: . (feeding supplement) PROSource Plus  30 mL Oral BID BM  . sodium chloride   Intravenous Once  . acyclovir  400 mg Oral Daily  . carvedilol  3.125 mg Oral BID WC  . Chlorhexidine Gluconate Cloth  6 each Topical Daily  . dexamethasone (DECADRON) injection  2 mg Intravenous QODAY  . insulin aspart  0-9 Units Subcutaneous TID WC  . insulin aspart  3 Units Subcutaneous TID WC  . insulin glargine  15 Units Subcutaneous QHS  . levETIRAcetam  500 mg Oral BID  . lidocaine  2 patch Transdermal Q24H  . melatonin  3 mg Oral QHS  . midodrine  7.5 mg Oral BID WC  . pantoprazole  40 mg Oral BID AC  . polyethylene glycol  17 g Oral Daily  . Ensure Max Protein  11 oz Oral BID BM  . QUEtiapine  12.5 mg Oral QHS  . senna-docusate  2 tablet Oral BID  . sodium chloride  2 spray Each Nare Q1H while awake  . sucralfate  1 g Oral TID WC & HS   Continuous Infusions: .  ceFAZolin (ANCEF) IV Stopped (04/04/2020 0654)  . lactated ringers 30 mL/hr at 04/07/2020 1200    Antimicrobials: Anti-infectives (From admission, onward)   Start     Dose/Rate Route Frequency Ordered Stop   03/20/20 1600  ceFAZolin (ANCEF) IVPB 2g/100 mL premix        2 g 200 mL/hr over 30 Minutes Intravenous Every 8 hours 03/20/20 1505     03/20/20 1000  acyclovir (ZOVIRAX) 200 MG capsule 400 mg        400 mg Oral Daily 03/14/2020 2217     03/20/20 1000  fluconazole (DIFLUCAN) tablet 100 mg        100 mg Oral Daily 04/01/2020 2217 03/23/20 0959   03/20/20 0800  vancomycin (VANCOREADY) IVPB 750 mg/150 mL  Status:  Discontinued        750 mg 150 mL/hr over 60 Minutes Intravenous Every 12 hours 04/01/2020 2217 03/20/20 1452     03/20/20 0800  ceFEPIme (MAXIPIME) 2 g in sodium chloride 0.9 % 100 mL IVPB  Status:  Discontinued        2 g 200 mL/hr over 30 Minutes Intravenous Every 8 hours 03/20/20 0001 03/20/20 1452   03/20/20 0015  ceFEPIme (MAXIPIME) 2 g in sodium chloride 0.9 % 100 mL IVPB        2 g 200 mL/hr over 30 Minutes Intravenous  Once 03/20/20 0001 03/20/20 0159   04/07/2020 2230  vancomycin (VANCOREADY) IVPB 1250 mg/250 mL  Status:  Discontinued        1,250 mg 166.7 mL/hr over 90 Minutes Intravenous  Once 04/02/2020 2217 04/07/2020 2225   03/20/2020 2224  piperacillin-tazobactam (ZOSYN) IVPB 3.375 g  Status:  Discontinued        3.375 g 12.5 mL/hr over 240 Minutes Intravenous Every 8 hours 03/18/2020 2217 03/20/20 0000     Objective: Vitals: Today's Vitals   03/29/2020 1000 03/26/2020 1100 03/26/2020 1114 03/20/2020 1200  BP: (!) 145/90 129/89  129/89  Pulse: (!) 107 (!) 113  (!) 110  Resp: (!) 36 (!) 31  (!) 26  Temp:      TempSrc:      SpO2: 98% 100%  99%  Weight:      PainSc:   7  3     Intake/Output Summary (Last 24 hours) at 03/22/2020 1307 Last data filed at 03/13/2020 1200 Gross per 24 hour  Intake 1370.47 ml  Output 1350 ml  Net 20.47 ml   Filed Weights   03/23/20 2218 03/24/20 0500 04/04/2020 0500  Weight: 57 kg 57 kg 58 kg   Weight change: 1 kg  Intake/Output from previous day: 09/16 0701 - 09/17 0700 In: 1100.5 [P.O.:480; I.V.:320.4; IV Piggyback:300.1] Out: 1350 [Urine:1350] Intake/Output this shift: Total I/O In: 750 [P.O.:600; I.V.:150] Out: -   Examination: General exam: Alert and oriented x2, mild distress secondary to midepigastric pain HEENT: Normocephalic and atraumatic, mucous membranes are moist Respiratory system: Clear to auscultation bilaterally, breathing not labored Cardiovascular system: Regular rhythm, mild tachycardia Gastrointestinal system: Soft, mildly distended, positive bowel sounds, nontender other than in the midepigastric area Nervous  System: No focal  deficits Extremities: No clubbing or cyanosis, trace pitting edema Psychiatry: Appropriate, no evidence of psychoses  Data Reviewed: I have personally reviewed following labs and imaging studies CBC: Recent Labs  Lab 03/22/20 0101 03/22/20 2045 03/23/20 0607 03/24/20 1018 03/18/2020 0704  WBC 3.3* 3.5* 3.4* 5.1 4.9  NEUTROABS 3.1 2.9 2.8 4.6 4.0  HGB 7.4* 7.0* 7.8* 8.9* 8.1*  HCT 21.0* 20.3* 23.2* 25.7* 23.8*  MCV 84.0 84.2 86.2 85.1 86.5  PLT 40* 38* 41* 50* 40*   Basic Metabolic Panel: Recent Labs  Lab 03/21/20 2259 03/22/20 0101 03/23/20 0607 03/24/20 1018 03/27/2020 0704  NA 124* 125* 129* 130* 127*  K 3.5 3.4* 4.0 3.8 3.7  CL 97* 97* 102 102 100  CO2 18* 19* 19* 16* 19*  GLUCOSE 202* 195* 266* 241* 205*  BUN 41* 40* 40* 38* 41*  CREATININE 1.06 1.02 0.94 1.12 1.23  CALCIUM 8.0* 8.1* 8.2* 8.6* 8.8*  MG  --   --  1.7  --   --   PHOS  --   --  2.7  --   --    GFR: Estimated Creatinine Clearance: 54 mL/min (by C-G formula based on SCr of 1.23 mg/dL). Liver Function Tests: Recent Labs  Lab 03/15/2020 1802 03/22/20 0101 03/23/20 0607 03/24/20 1018 03/24/2020 0704  AST 38 35 45* 29 28  ALT _0 ALKPHOS 94 73 70 78 79  BILITOT 0.6 0.5 0.2* 0.4 0.5  PROT 6.3* 5.6* 5.7* 5.7* 5.6*  ALBUMIN 1.9* 1.4* 1.5* 1.6* 1.6*   No results for input(s): LIPASE, AMYLASE in the last 168 hours. Recent Labs  Lab 03/22/20 0423  AMMONIA 45*   Coagulation Profile: Recent Labs  Lab 03/20/20 0201 03/20/20 1134  INR 1.4* 1.5*   Cardiac Enzymes: No results for input(s): CKTOTAL, CKMB, CKMBINDEX, TROPONINI in the last 168 hours. BNP (last 3 results) No results for input(s): PROBNP in the last 8760 hours. HbA1C: No results for input(s): HGBA1C in the last 72 hours. CBG: Recent Labs  Lab 03/24/20 1529 03/24/20 2128 03/24/20 2303 03/10/2020 0714 04/02/2020 1109  GLUCAP 258* 223* 215* 189* 183*   Lipid Profile: No results for input(s): CHOL, HDL, LDLCALC, TRIG,  CHOLHDL, LDLDIRECT in the last 72 hours. Thyroid Function Tests: No results for input(s): TSH, T4TOTAL, FREET4, T3FREE, THYROIDAB in the last 72 hours. Anemia Panel: No results for input(s): VITAMINB12, FOLATE, FERRITIN, TIBC, IRON, RETICCTPCT in the last 72 hours. Sepsis Labs: Recent Labs  Lab 03/20/20 0040 03/22/20 0101 03/22/20 0423  LATICACIDVEN 1.4 1.4 1.2    Recent Results (from the past 240 hour(s))  Culture, Urine     Status: Abnormal   Collection Time: 03/14/2020  2:28 PM   Specimen: Urine, Random  Result Value Ref Range Status   Specimen Description URINE, RANDOM  Final   Special Requests NONE  Final   Culture (A)  Final    <10,000 COLONIES/mL INSIGNIFICANT GROWTH Performed at [facility], [place] [address], [place], [place] [address]    Report Status 03/20/2020 FINAL  Final  Culture, blood (routine x 2)     Status: Abnormal   Collection Time: 04/02/2020  6:02 PM   Specimen: BLOOD  Result Value Ref Range Status   Specimen Description BLOOD RIGHT ANTECUBITAL  Final   Special Requests   Final    BOTTLES DRAWN AEROBIC AND ANAEROBIC Blood Culture adequate volume   Culture  Setup Time   Final  GRAM POSITIVE COCCI IN CLUSTERS IN BOTH AEROBIC AND ANAEROBIC BOTTLES Organism ID to follow CRITICAL RESULT CALLED TO, READ BACK BY AND VERIFIED WITH: Oswaldo Done, AT 3299 03/20/20 BY Rush Landmark Performed at [facility], [place] [address] [place], [place] [address]    Culture STAPHYLOCOCCUS AUREUS (A)  Final   Report Status 03/22/2020 FINAL  Final   Organism ID, Bacteria STAPHYLOCOCCUS AUREUS  Final      Susceptibility   Staphylococcus aureus - MIC*    CIPROFLOXACIN <=0.5 SENSITIVE Sensitive     ERYTHROMYCIN <=0.25 SENSITIVE Sensitive     GENTAMICIN <=0.5 SENSITIVE Sensitive     OXACILLIN 0.5 SENSITIVE Sensitive     TETRACYCLINE <=1 SENSITIVE Sensitive     VANCOMYCIN 1 SENSITIVE Sensitive     TRIMETH/SULFA <=10 SENSITIVE Sensitive     CLINDAMYCIN <=0.25  SENSITIVE Sensitive     RIFAMPIN <=0.5 SENSITIVE Sensitive     Inducible Clindamycin NEGATIVE Sensitive     * STAPHYLOCOCCUS AUREUS  Blood Culture ID Panel (Reflexed)     Status: Abnormal   Collection Time: 03/18/2020  6:02 PM  Result Value Ref Range Status   Enterococcus faecalis NOT DETECTED NOT DETECTED Final   Enterococcus Faecium NOT DETECTED NOT DETECTED Final   Listeria monocytogenes NOT DETECTED NOT DETECTED Final   Staphylococcus species DETECTED (A) NOT DETECTED Final    Comment: CRITICAL RESULT CALLED TO, READ BACK BY AND VERIFIED WITH: Barth Kirks PHARMD, AT 0740 03/20/20 BY D. VANHOOK    Staphylococcus aureus (BCID) DETECTED (A) NOT DETECTED Final    Comment: CRITICAL RESULT CALLED TO, READ BACK BY AND VERIFIED WITH: Barth Kirks PHARMD, AT 0740 03/20/20 BY D. VANHOOK    Staphylococcus epidermidis NOT DETECTED NOT DETECTED Final   Staphylococcus lugdunensis NOT DETECTED NOT DETECTED Final   Streptococcus species NOT DETECTED NOT DETECTED Final   Streptococcus agalactiae NOT DETECTED NOT DETECTED Final   Streptococcus pneumoniae NOT DETECTED NOT DETECTED Final   Streptococcus pyogenes NOT DETECTED NOT DETECTED Final   A.calcoaceticus-baumannii NOT DETECTED NOT DETECTED Final   Bacteroides fragilis NOT DETECTED NOT DETECTED Final   Enterobacterales NOT DETECTED NOT DETECTED Final   Enterobacter cloacae complex NOT DETECTED NOT DETECTED Final   Escherichia coli NOT DETECTED NOT DETECTED Final   Klebsiella aerogenes NOT DETECTED NOT DETECTED Final   Klebsiella oxytoca NOT DETECTED NOT DETECTED Final   Klebsiella pneumoniae NOT DETECTED NOT DETECTED Final   Proteus species NOT DETECTED NOT DETECTED Final   Salmonella species NOT DETECTED NOT DETECTED Final   Serratia marcescens NOT DETECTED NOT DETECTED Final   Haemophilus influenzae NOT DETECTED NOT DETECTED Final   Neisseria meningitidis NOT DETECTED NOT DETECTED Final   Pseudomonas aeruginosa NOT DETECTED NOT DETECTED Final     Stenotrophomonas maltophilia NOT DETECTED NOT DETECTED Final   Candida albicans NOT DETECTED NOT DETECTED Final   Candida auris NOT DETECTED NOT DETECTED Final   Candida glabrata NOT DETECTED NOT DETECTED Final   Candida krusei NOT DETECTED NOT DETECTED Final   Candida parapsilosis NOT DETECTED NOT DETECTED Final   Candida tropicalis NOT DETECTED NOT DETECTED Final   Cryptococcus neoformans/gattii NOT DETECTED NOT DETECTED Final   Meth resistant mecA/C and MREJ NOT DETECTED NOT DETECTED Final    Comment: Performed at [facility], [address] [place], [place] [address]  SARS Coronavirus 2 by RT PCR (hospital order, performed in [facility] hospital lab) Nasopharyngeal Nasopharyngeal Swab     Status: None  Collection Time: 03/28/2020  8:37 PM   Specimen: Nasopharyngeal Swab  Result Value Ref Range Status   SARS Coronavirus 2 NEGATIVE NEGATIVE Final    Comment: (NOTE) SARS-CoV-2 target nucleic acids are NOT DETECTED.  The SARS-CoV-2 RNA is generally detectable in upper and lower respiratory specimens during the acute phase of infection. The lowest concentration of SARS-CoV-2 viral copies this assay can detect is 250 copies / mL. A negative result does not preclude SARS-CoV-2 infection and should not be used as the sole basis for treatment or other patient management decisions.  A negative result may occur with improper specimen collection / handling, submission of specimen other than nasopharyngeal swab, presence of viral mutation(s) within the areas targeted by this assay, and inadequate number of viral copies (<250 copies / mL). A negative result must be combined with clinical observations, patient history, and epidemiological information.  Fact Sheet for Patients:   StrictlyIdeas.no  Fact Sheet for Healthcare Providers: BankingDealers.co.za  This test is not yet approved or  cleared by the [place] FDA and has been  authorized for detection and/or diagnosis of SARS-CoV-2 by FDA under an Emergency Use Authorization (EUA).  This EUA will remain in effect (meaning this test can be used) for the duration of the COVID-19 declaration under Section 564(b)(1) of the Act, 21 U.S.C. section 360bbb-3(b)(1), unless the authorization is terminated or revoked sooner.  Performed at [facility], [place] [address] [place], [place] [address]   MRSA PCR Screening     Status: None   Collection Time: 03/20/20  4:21 AM   Specimen: Nasopharyngeal  Result Value Ref Range Status   MRSA by PCR NEGATIVE NEGATIVE Final    Comment:        The GeneXpert MRSA Assay (FDA approved for NASAL specimens only), is one component of a comprehensive MRSA colonization surveillance program. It is not intended to diagnose MRSA infection nor to guide or monitor treatment for MRSA infections. Performed at [facility], [place] [address] [place], [place] [address]   Respiratory Panel by PCR     Status: None   Collection Time: 03/20/20  4:21 AM   Specimen: Nasopharyngeal Swab; Respiratory  Result Value Ref Range Status   Adenovirus NOT DETECTED NOT DETECTED Final   Coronavirus 229E NOT DETECTED NOT DETECTED Final    Comment: (NOTE) The Coronavirus on the Respiratory Panel, DOES NOT test for the novel  Coronavirus (2019 nCoV)    Coronavirus HKU1 NOT DETECTED NOT DETECTED Final   Coronavirus NL63 NOT DETECTED NOT DETECTED Final   Coronavirus OC43 NOT DETECTED NOT DETECTED Final   Metapneumovirus NOT DETECTED NOT DETECTED Final   Rhinovirus / Enterovirus NOT DETECTED NOT DETECTED Final   Influenza A NOT DETECTED NOT DETECTED Final   Influenza B NOT DETECTED NOT DETECTED Final   Parainfluenza Virus 1 NOT DETECTED NOT DETECTED Final   Parainfluenza Virus 2 NOT DETECTED NOT DETECTED Final   Parainfluenza Virus 3 NOT DETECTED NOT DETECTED Final   Parainfluenza Virus 4 NOT DETECTED NOT DETECTED Final   Respiratory Syncytial  Virus NOT DETECTED NOT DETECTED Final   Bordetella pertussis NOT DETECTED NOT DETECTED Final   Chlamydophila pneumoniae NOT DETECTED NOT DETECTED Final   Mycoplasma pneumoniae NOT DETECTED NOT DETECTED Final    Comment: Performed at [facility], [place]. [address] [place], [place] [address]  Culture, blood (Routine X 2) w Reflex to ID Panel     Status: None (Preliminary result)   Collection  Time: 03/21/20  1:59 PM   Specimen: BLOOD RIGHT HAND  Result Value Ref Range Status   Specimen Description BLOOD RIGHT HAND  Final   Special Requests   Final    BOTTLES DRAWN AEROBIC AND ANAEROBIC Blood Culture results may not be optimal due to an excessive volume of blood received in culture bottles   Culture   Final    NO GROWTH 4 DAYS Performed at [facility], [place] [address] [place], [place] [address]    Report Status PENDING  Incomplete  Culture, blood (Routine X 2) w Reflex to ID Panel     Status: None (Preliminary result)   Collection Time: 03/21/20  1:59 PM   Specimen: BLOOD LEFT HAND  Result Value Ref Range Status   Specimen Description BLOOD LEFT HAND  Final   Special Requests   Final    BOTTLES DRAWN AEROBIC AND ANAEROBIC Blood Culture results may not be optimal due to an excessive volume of blood received in culture bottles   Culture   Final    NO GROWTH 4 DAYS Performed at [facility], [place] [address] [place], [place] [address]    Report Status PENDING  Incomplete     Radiology Studies: CT ABDOMEN PELVIS WO CONTRAST  Result Date: 03/24/2020 CLINICAL DATA:  Abdominal pain, abdominal distension EXAM: CT ABDOMEN AND PELVIS WITHOUT CONTRAST TECHNIQUE: Multidetector CT imaging of the abdomen and pelvis was performed following the standard protocol without IV contrast. Oral enteric contrast was administered. COMPARISON:  CT chest abdomen pelvis, 03/20/2020 FINDINGS: Lower chest: Dependent bibasilar atelectasis and/or consolidation with small bilateral pleural effusions,  similar to prior examination. Hepatobiliary: No solid liver abnormality is seen. No gallstones, gallbladder wall thickening, or biliary dilatation. Pancreas: Unremarkable. No pancreatic ductal dilatation or surrounding inflammatory changes. Spleen: Normal in size without significant abnormality. Adrenals/Urinary Tract: Adrenal glands are unremarkable. Kidneys are normal, without renal calculi, solid lesion, or hydronephrosis. Bladder is unremarkable. Stomach/Bowel: Stomach is within normal limits. The small bowel and colon are diffusely gas distended, gas and stool present to the rectum, and degree of bowel distension somewhat increased compared to prior examination. Largest loops of mid small bowel measure up to 4.4 cm in caliber. The cecum measures up to 9.4 cm. Vascular/Lymphatic: No significant vascular findings are present. No enlarged abdominal or pelvic lymph nodes. Reproductive: No mass or other significant abnormality. Other: Small fat and fluid containing bilateral inguinal hernias. Trace ascites throughout the abdomen and pelvis, new compared to prior examination. Musculoskeletal: There is redemonstrated, diffuse lytic osseous metastatic disease, most notable for a large lytic lesion and associated pathologic fracture of the T9 vertebral body. IMPRESSION: 1. The small bowel and colon are diffusely gas distended, gas and stool present to the rectum, and degree of bowel distension somewhat increased compared to prior examination. Findings are most consistent with ileus. 2. Trace ascites throughout the abdomen and pelvis, new compared to prior examination. 3. Dependent bibasilar atelectasis and/or consolidation of the included lung bases with small bilateral pleural effusions, similar to prior examination. 4. There is redemonstrated, diffuse lytic osseous metastatic disease, most notable for a large lytic lesion and associated pathologic fracture of the T9 vertebral body. Findings are in keeping with  multiple myeloma. Electronically Signed   By: Eddie Candle M.D.   On: 03/24/2020 17:21   DG Abd Portable 1V  Result Date: 03/30/2020 CLINICAL DATA:  Abdominal distention, ileus EXAM: PORTABLE ABDOMEN - 1 VIEW COMPARISON:  03/17/2020 FINDINGS: Diffuse gaseous distention  of bowel, stable or worsening since prior study. No visible free air organomegaly. IMPRESSION: Stable or slight worsening diffuse gaseous distention of bowel compatible with ileus. Electronically Signed   By: Rolm Baptise M.D.   On: 03/10/2020 05:54   EEG adult  Result Date: 03/24/2020 Lora Havens, MD     03/24/2020 11:15 AM Patient Name: Mahlon Gabrielle MRN: [id number] Epilepsy Attending: Lora Havens Referring Physician/Provider: Dr Roland Rack Date: 03/24/2020 Duration: 22.49 mins Patient history: 54yo M with seizure like episode. EEG to evaluate for seizure. Level of alertness: Awake AEDs during EEG study: LEV Technical aspects: This EEG study was done with scalp electrodes positioned according to the 10-20 International system of electrode placement. Electrical activity was acquired at a sampling rate of 500Hz and reviewed with a high frequency filter of 70Hz and a low frequency filter of 1Hz. EEG data were recorded continuously and digitally stored. Description: No posterior dominant rhythm was seen. EEG showed continuous generalized 3 to 6 Hz theta-delta slowing. Significant eye flutter artifact was also noted. Hyperventilation and photic stimulation were not performed.   ABNORMALITY -Continuous slow, generalized IMPRESSION: This study is suggestive of moderate diffuse encephalopathy, nonspecific etiology. No seizures or epileptiform discharges were seen throughout the recording. Lora Havens     LOS: 6 days   Annita Brod, MD [facility]  03/18/2020, 1:07 PM

## 2020-03-25 NOTE — Progress Notes (Signed)
Occupational Therapy Treatment Patient Details Name: Trevor Shelton MRN: [id number] DOB: [birth year] Today's Date: 04/01/2020    History of present illness 54 yo male with PMH of HTN and HLD.  Pt with recent admission 02/14/20-03/03/20 with neoplasm consistent with multiple myeloma with multiple spinal lesions, L rib fx 9 and 10, and cord compression.  Pt on Decadron for cord compression.  Pt was at inpt rehab and developed nose bleed and fever. Has been admitted with fever, multiple myeloma, AMS, AKI, and pancytopenia.  Pt found to have small intracrainal hemorrhage and severe sepsis with PNA 9/14 rapid response called transfer to ICU sleep cycle disturbed with visaul hallunications TEE cancelled EEG moderate diffuse encephalopathy   OT comments  Pt currently total (A) total +2 to transfer to chair with complete bil LE blocking. Recommend RN staff only hoyer lift the patient at this time. Ot recommending palliative consult due to complexity of medical conditions since admission. Pt tolerates oob to chair with 2 L o2 [place] required this session   Follow Up Recommendations  SNF    Equipment Recommendations  3 in 1 bedside commode;Wheelchair (measurements OT);Wheelchair cushion (measurements OT);Hospital bed;Other (comment) (hoyer lift - palliative consult)    Recommendations for Other Services Other (comment) (Palliative)    Precautions / Restrictions Precautions Precautions: Fall Precaution Comments: Significant BLE weakness and ataxia-like movements; back and rib precautions for comfort due to fxs Restrictions Weight Bearing Restrictions: No       Mobility Bed Mobility Overal bed mobility: Needs Assistance Bed Mobility: Rolling;Supine to Sit Rolling: Max assist   Supine to sit: +2 for physical assistance;Max assist     General bed mobility comments: pt requires 1 step commands with hand over hand to initiate movements to roll toward R side. able to sustain grasp on L  side  Transfers Overall transfer level: Needs assistance   Transfers: Sit to/from Stand;Stand Pivot Transfers Sit to Stand: +2 physical assistance;Total assist Stand pivot transfers: +2 physical assistance;Total assist       General transfer comment: requrires total (A) with blocking bil LE    Balance   Sitting-balance support: Bilateral upper extremity supported;Feet supported Sitting balance-Leahy Scale: Poor                                     ADL either performed or assessed with clinical judgement   ADL Overall ADL's : Needs assistance/impaired Eating/Feeding: Minimal assistance;Bed level                       Toilet Transfer: +2 for physical assistance;Total assistance Toilet Transfer Details (indicate cue type and reason): stand pivot to chair from bed                 Vision       Perception     Praxis      Cognition Arousal/Alertness: Awake/alert Behavior During Therapy: WFL for tasks assessed/performed Overall Cognitive Status: Impaired/Different from baseline Area of Impairment: Attention                   Current Attention Level: Alternating (able to answer MD and wife)           General Comments: pt answering wife questions and visually attending to MD in room during session. pt verbalized pain in back and chest multiple times but does not ask questions of staff or express any needs. pt agreeable  to OOB. pt following 1 step commands consistently. pt needs (A) To facilitation movement at times but once initiated sustain the task question more motor planning deficit than understanding of the request        Exercises     Shoulder Instructions       General Comments risk for skin break down so placed on geo mat in chair     Pertinent Vitals/ Pain       Pain Assessment: 0-10 Pain Score: 7  Pain Location: back and chest Pain Descriptors / Indicators: Discomfort Pain Intervention(s): Monitored during  session;Premedicated before session;Repositioned  Home Living                                          Prior Functioning/Environment              Frequency  Min 2X/week        Progress Toward Goals  OT Goals(current goals can now be found in the care plan section)  Progress towards OT goals: Not progressing toward goals - comment  Acute Rehab OT Goals Patient Stated Goal: to get to chair  OT Goal Formulation: With patient Time For Goal Achievement: 04/08/20 Potential to Achieve Goals: Fair ADL Goals Pt Will Perform Grooming: with supervision;sitting Pt Will Perform Upper Body Bathing: with supervision;sitting Pt Will Perform Lower Body Bathing: with min assist;with adaptive equipment;sit to/from stand Pt Will Perform Upper Body Dressing: with min guard assist;sitting Pt Will Transfer to Toilet: with min assist;stand pivot transfer;bedside commode Pt/caregiver will Perform Home Exercise Program: Increased strength;Both right and left upper extremity;With theraband;With Supervision Additional ADL Goal #1: pt will complete bed mobility supervision level with back precautions  Plan Discharge plan remains appropriate    Co-evaluation    PT/OT/SLP Co-Evaluation/Treatment: Yes Reason for Co-Treatment: For patient/therapist safety;To address functional/ADL transfers;Necessary to address cognition/behavior during functional activity   OT goals addressed during session: ADL's and self-care;Proper use of Adaptive equipment and DME;Strengthening/ROM      AM-PAC OT "6 Clicks" Daily Activity     Outcome Measure   Help from another person eating meals?: A Little Help from another person taking care of personal grooming?: A Lot Help from another person toileting, which includes using toliet, bedpan, or urinal?: A Lot Help from another person bathing (including washing, rinsing, drying)?: A Lot Help from another person to put on and taking off regular upper  body clothing?: A Lot Help from another person to put on and taking off regular lower body clothing?: Total 6 Click Score: 12    End of Session Equipment Utilized During Treatment: Gait belt;Oxygen  OT Visit Diagnosis: Unsteadiness on feet (R26.81);Other abnormalities of gait and mobility (R26.89);Other symptoms and signs involving the nervous system (R29.898);Muscle weakness (generalized) (M62.81);Pain;Feeding difficulties (R63.3);Other symptoms and signs involving cognitive function   Activity Tolerance Patient tolerated treatment well   Patient Left in chair;with call bell/phone within reach;with chair alarm set;with family/visitor present   Nurse Communication Mobility status;Precautions        Time: 1043-1100 OT Time Calculation (min): 17 min  Charges: OT General Charges $OT Visit: 1 Visit OT Treatments $Therapeutic Activity: 8-22 mins   Brynn, OTR/L  Acute Rehabilitation Services Pager: [phone] Office: [phone] .    Jeri Modena 03/12/2020, 11:27 AM

## 2020-03-26 LAB — COMPREHENSIVE METABOLIC PANEL
ALT: 13 U/L (ref 0–44)
AST: 29 U/L (ref 15–41)
Albumin: 1.5 g/dL — ABNORMAL LOW (ref 3.5–5.0)
Alkaline Phosphatase: 70 U/L (ref 38–126)
Anion gap: 10 (ref 5–15)
BUN: 35 mg/dL — ABNORMAL HIGH (ref 6–20)
CO2: 17 mmol/L — ABNORMAL LOW (ref 22–32)
Calcium: 9.1 mg/dL (ref 8.9–10.3)
Chloride: 100 mmol/L (ref 98–111)
Creatinine, Ser: 0.88 mg/dL (ref 0.61–1.24)
GFR calc Af Amer: >60 mL/min (ref >60)
GFR calc non Af Amer: >60 mL/min (ref >60)
Glucose, Bld: 192 mg/dL — ABNORMAL HIGH (ref 70–99)
Potassium: 3.8 mmol/L (ref 3.5–5.1)
Sodium: 127 mmol/L — ABNORMAL LOW (ref 135–145)
Total Bilirubin: 0.4 mg/dL (ref 0.3–1.2)
Total Protein: 5.2 g/dL — ABNORMAL LOW (ref 6.5–8.1)

## 2020-03-26 LAB — CBC WITH DIFFERENTIAL/PLATELET
Abs Immature Granulocytes: 0.23 10*3/uL — ABNORMAL HIGH (ref 0.00–0.07)
Basophils Absolute: 0 10*3/uL (ref 0.0–0.1)
Basophils Relative: 0 %
Eosinophils Absolute: 0 10*3/uL (ref 0.0–0.5)
Eosinophils Relative: 0 %
HCT: 21.4 % — ABNORMAL LOW (ref 39.0–52.0)
Hemoglobin: 7.1 g/dL — ABNORMAL LOW (ref 13.0–17.0)
Immature Granulocytes: 6 %
Lymphocytes Relative: 7 %
Lymphs Abs: 0.3 10*3/uL — ABNORMAL LOW (ref 0.7–4.0)
MCH: 28.6 pg (ref 26.0–34.0)
MCHC: 33.2 g/dL (ref 30.0–36.0)
MCV: 86.3 fL (ref 80.0–100.0)
Monocytes Absolute: 0.2 10*3/uL (ref 0.1–1.0)
Monocytes Relative: 4 %
Neutro Abs: 3.3 10*3/uL (ref 1.7–7.7)
Neutrophils Relative %: 83 %
Platelets: 35 10*3/uL — ABNORMAL LOW (ref 150–400)
RBC: 2.48 MIL/uL — ABNORMAL LOW (ref 4.22–5.81)
RDW: 13.5 % (ref 11.5–15.5)
WBC: 4 10*3/uL (ref 4.0–10.5)
nRBC: 1.5 % — ABNORMAL HIGH (ref 0.0–0.2)

## 2020-03-26 LAB — CULTURE, BLOOD (ROUTINE X 2)
Culture: NO GROWTH
Culture: NO GROWTH

## 2020-03-26 LAB — GLUCOSE, CAPILLARY
Glucose-Capillary: 153 mg/dL — ABNORMAL HIGH (ref 70–99)
Glucose-Capillary: 173 mg/dL — ABNORMAL HIGH (ref 70–99)
Glucose-Capillary: 156 mg/dL — ABNORMAL HIGH (ref 70–99)
Glucose-Capillary: 164 mg/dL — ABNORMAL HIGH (ref 70–99)

## 2020-03-26 MED ORDER — ENSURE MAX PROTEIN PO LIQD
11.0000 [oz_av] | Freq: Two times a day (BID) | ORAL | Status: DC
  Administered 2020-03-26 – 2020-04-05 (×11): 11 [oz_av] via ORAL
  Filled 2020-03-26 (×24): qty 330

## 2020-03-26 MED ORDER — MIDODRINE HCL 5 MG PO TABS
2.5000 mg | ORAL_TABLET | Freq: Two times a day (BID) | ORAL | Status: DC
  Administered 2020-03-27: 2.5 mg via ORAL
  Filled 2020-03-26: qty 1

## 2020-03-26 MED ORDER — MORPHINE SULFATE (PF) 2 MG/ML IV SOLN
2.0000 mg | INTRAVENOUS | Status: DC | PRN
  Administered 2020-03-27 – 2020-04-02 (×20): 2 mg via INTRAVENOUS
  Filled 2020-03-26 (×20): qty 1

## 2020-03-26 NOTE — Progress Notes (Signed)
PROGRESS NOTE    Trevor Shelton  MWN:[id number] DOB: Feb 14, 1966 DOA: 04/03/2020 PCP: Patient, No Pcp Per   Brief Narrative: 54/M with very long complex hospitalization was admitted to [facility] on 02/14/20 with back pain was found to have diffuse lytic lesions throughout the skeleton, epidural tumor extension with pathological fracture of T6 and T7, contrast-enhancing mass replacing most of T5 vertebral body with severe cord compression, acute fractures of left ninth and 10th ribs as well as acute renal failure  -Seen by medical and radiation oncology, treated with IV Decadron and has completed 10/10 doses of XRT.  Work-up consistent with newly diagnosed IgG kappa MM with plans for CyBorD chemo to start following completion of XRT.   -Following transfer to rehab, he underwent chemotherapy on 03/15/2020 and next chemotherapy was scheduled for 03/22/2020.  Patient had significant nosebleed on 03/17/2020.  He received 2 units PRBCs on 03/18/2020 due to hemoglobin 6.8.   -Subsequently spiked fevers with T-max 102 -blood culture grew MSSA bacteremia, started on IV Ancef seen by ID as well for this -During this hospitalization on a CT he was also noted to have a 5 x 2 cm left upper lobe mass of unclear etiology in addition to other findings of diffuse metastatic myeloma -9/13: Ongoing epistaxis platelet count down to 25K transfuse 1 unit of platelets -9/13 night lethargic less responsive, seen by neurology, suspicion for seizure-like activity, started on Keppra, EEG negative, MRI noted acute intracranial hemorrhage in the right frontal convexity, followed by neurology for this Mental status slowly improving   Assessment & Plan:  Acute intracranial hemorrhage  -involving the anterior-inferior right frontal convexity likely due to severe thrombocytopenia from effect of multiple myeloma and chemotherapy.  - Repeat CT head decreased size of right frontal anterior inferior convexity motors, MRI.    -Appreciate neurology input, recommended baby aspirin when platelet count is higher and cleared by oncology -Monitor platelet count  Severe Sepsis  -MSSA bacteremia:  -Hospital-acquired MSSA bacteremia, clinically improved quickly after antibiotics seen by infectious disease for same  -Repeat blood cultures are negative  -2D echocardiogram was unremarkable, no central lines at this time  -TEE deferred especially in the setting of severe thrombocytopenia and complicated hospitalization -ID recommended to continue Ancef through 9/27  Metastatic multiple myeloma Hypercalcemia: -treated with radiation of spine due to concern for impending cord compression- MRI 8.8.21: Epidural tumor extension at T6 and T9 and T6 lesion severely narrowed thecal sac and deformed the spinal cord . He had velcade and dexamethasone, acyclovir, diflucan.  Oncology following closely continue current plan.  Chemotherapy on hold given acute infection -Will discuss prognosis with oncology -Remains on Decadron every other day and prophylactic acyclovir  Pancytopenia/ Anemia/severe Thrombcytopenia -secondary to metastatic myeloma and recent chemo  -  S/p 1 unit platelet transfusion 9/13.  -Also transfused 2 units of PRBC this admission -Hemoglobin down to 7.1 this morning, monitor and transfuse if less than 7  Epistaxis-resolved.   -Continue Afrin-prn  Acute encephalopathy multifatorial-likely multifactorial in the setting of sepsis, stroke.mri brain w/ small bleed, and also w/ MSSA bacteremia/sepsis, hyponatremia.   -There is question of seizure so was transferred to neuro ICU 9/14 and is on Keppra.EEG showing moderate encephalopathy no epileptiform discharges.  On 9/15, had a staring episode seen by neurology and EEG stat no acute finding.  Has improved since then.  Chest pain/diffuse body pain likely from likely from myeloma.  Serial troponin negative continue pain control.    Left upper lobe with  5.2/1.5 cm  pleural based mass- concern for primary lung malignancy versus plasmacytoma per CT. followed by oncology closely.  Interventional radiology planning for pleural biopsy, hopefully Monday.  Hyponatremia: Likely hypovolemic.  TSH cortisol okay.   -Sodium improved  AKI/Short term HD in august 2021: Renal function is stable  T2DM-newly diagnosed in august 2021. a1c 6.5.  -CBGs fairly stable now continue Lantus and sliding scale insulin  Chronic constipation Ileus -Having BMs now, last ones were overnight -Continue laxatives  HTN/HLD- bp meds amlodipine on hold, on coreg low dose and is on Midodrine- BP stable -Wean off midodrine  Markedly enlarged prostate in CT: PSA normal 1.5   Weakness/debility/physical deconditioning: Continue PT OT, suspect multifactorial in the setting of multiple illness new cancer chemotherapy.  Return to inpatient rehab eventually.  DVT prophylaxis: SCDs Start: 03/20/20 0020 Code Status:   Code Status: Full Code  Family Communication: Plan of care discussed with patient at bedside with [place] interpreter.  Status is: Inpatient Remains inpatient appropriate because:Ongoing diagnostic testing needed not appropriate for outpatient work up and Remains hospitalized due to ongoing management of MSSA sepsis, pancytopenia  Dispo: The patient is from: CIR              Anticipated d/c is to: TBD.  Transfer to medical floor              Anticipated d/c date is: > 3 days              Patient currently is not medically stable to d/c. Nutrition: Diet Order            Diet full liquid Room service appropriate? Yes; Fluid consistency: Thin  Diet effective now                Body mass index is 23.82 kg/m.  Consultants:see note  Procedures: see note.  CT chest abd pelvis w/ contrast: Mild to moderate severity bilateral areas of atelectasis and/or infiltrate. 2. 5.2 cm x 1.5 cm pleural based soft tissue mass along the anterior aspect of the left upper lobe. A  1.4 cm pleural based soft tissue nodule is seen within this region on the prior study. These findings are concerning for the presence of a primary lung malignancy. Further evaluation with a nuclear medicine PET/CT is recommended. 3. Findings consistent with multiple myeloma versus diffuse osseous metastasis, with chronic pathologic fractures at the levels of T6 and T9 vertebral bodies. 4. Markedly enlarged prostate gland. 5. 4.0 cm x 3.2 cm fat-containing right scrotal hernia. 6. Mild cardiomegaly. 7. Small hiatal hernia. 8. Aortic atherosclerosis. 9. Small simple cyst within the left kidney.  Microbiology:see note Blood Culture    Component Value Date/Time   SDES BLOOD RIGHT HAND 03/21/2020 1359   SDES BLOOD LEFT HAND 03/21/2020 1359   SPECREQUEST  03/21/2020 1359    BOTTLES DRAWN AEROBIC AND ANAEROBIC Blood Culture results may not be optimal due to an excessive volume of blood received in culture bottles   SPECREQUEST  03/21/2020 1359    BOTTLES DRAWN AEROBIC AND ANAEROBIC Blood Culture results may not be optimal due to an excessive volume of blood received in culture bottles   CULT  03/21/2020 1359    NO GROWTH 4 DAYS Performed at [facility], [place] [address] [place], [place] [address]    CULT  03/21/2020 1359    NO GROWTH 4 DAYS Performed at [facility], [place] [address] [place], [place] [address]    REPTSTATUS  PENDING 03/21/2020 1359   REPTSTATUS PENDING 03/21/2020 1359    Other culture-see note  Medications: Scheduled Meds: . (feeding supplement) PROSource Plus  30 mL Oral BID BM  . sodium chloride   Intravenous Once  . acyclovir  400 mg Oral Daily  . carvedilol  3.125 mg Oral BID WC  . Chlorhexidine Gluconate Cloth  6 each Topical Daily  . dexamethasone (DECADRON) injection  2 mg Intravenous QODAY  . insulin aspart  0-9 Units Subcutaneous TID WC  . insulin aspart  3 Units Subcutaneous TID WC  . insulin glargine  15 Units Subcutaneous QHS  .  levETIRAcetam  500 mg Oral BID  . lidocaine  2 patch Transdermal Q24H  . melatonin  3 mg Oral QHS  . midodrine  7.5 mg Oral BID WC  . pantoprazole  40 mg Oral BID AC  . polyethylene glycol  17 g Oral Daily  . Ensure Max Protein  11 oz Oral BID BM  . QUEtiapine  12.5 mg Oral QHS  . senna-docusate  2 tablet Oral BID  . sodium chloride  2 spray Each Nare Q1H while awake  . sucralfate  1 g Oral TID WC & HS   Continuous Infusions: .  ceFAZolin (ANCEF) IV Stopped (03/26/20 6015)  . lactated ringers 30 mL/hr at 03/26/20 0919    Antimicrobials: Anti-infectives (From admission, onward)   Start     Dose/Rate Route Frequency Ordered Stop   03/20/20 1600  ceFAZolin (ANCEF) IVPB 2g/100 mL premix        2 g 200 mL/hr over 30 Minutes Intravenous Every 8 hours 03/20/20 1505     03/20/20 1000  acyclovir (ZOVIRAX) 200 MG capsule 400 mg        400 mg Oral Daily 03/30/2020 2217     03/20/20 1000  fluconazole (DIFLUCAN) tablet 100 mg        100 mg Oral Daily 03/18/2020 2217 03/23/20 0959   03/20/20 0800  vancomycin (VANCOREADY) IVPB 750 mg/150 mL  Status:  Discontinued        750 mg 150 mL/hr over 60 Minutes Intravenous Every 12 hours 03/27/2020 2217 03/20/20 1452   03/20/20 0800  ceFEPIme (MAXIPIME) 2 g in sodium chloride 0.9 % 100 mL IVPB  Status:  Discontinued        2 g 200 mL/hr over 30 Minutes Intravenous Every 8 hours 03/20/20 0001 03/20/20 1452   03/20/20 0015  ceFEPIme (MAXIPIME) 2 g in sodium chloride 0.9 % 100 mL IVPB        2 g 200 mL/hr over 30 Minutes Intravenous  Once 03/20/20 0001 03/20/20 0159   03/24/2020 2230  vancomycin (VANCOREADY) IVPB 1250 mg/250 mL  Status:  Discontinued        1,250 mg 166.7 mL/hr over 90 Minutes Intravenous  Once 03/26/2020 2217 03/26/2020 2225   03/24/2020 2224  piperacillin-tazobactam (ZOSYN) IVPB 3.375 g  Status:  Discontinued        3.375 g 12.5 mL/hr over 240 Minutes Intravenous Every 8 hours 03/27/2020 2217 03/20/20 0000     Objective: Vitals: Today's Vitals     03/26/20 0600 03/26/20 0700 03/26/20 0800 03/26/20 1055  BP: 117/75 131/86 135/78   Pulse: 89 97 80   Resp: 19 (!) 24 20   Temp:   98.7 F (37.1 C)   TempSrc:   Axillary   SpO2: 96% 97% 98%   Weight:      PainSc:   4  7     Intake/Output  Summary (Last 24 hours) at 03/26/2020 1101 Last data filed at 03/26/2020 0800 Gross per 24 hour  Intake 1157.23 ml  Output 1525 ml  Net -367.77 ml   Filed Weights   03/24/20 0500 03/12/2020 0500 03/26/20 0500  Weight: 57 kg 58 kg 61 kg   Weight change: 3 kg  Intake/Output from previous day: 09/17 0701 - 09/18 0700 In: 1607.1 [P.O.:600; I.V.:707.1; IV Piggyback:300] Out: 1100 [Urine:1100] Intake/Output this shift: Total I/O In: 30 [I.V.:30] Out: 425 [Urine:425]  Examination: General exam: Chronically ill middle-aged male sitting up in bed, uncomfortable appearing, awake alert oriented x2 HEENT: Oral mucosa is moist, neck no JVD CVS: S1-S2, regular rate rhythm Lungs: Decreased breath sounds the bases Abdomen: Firm, mildly distended, bowel sounds present but diminished Extremities: Trace edema  Psych: Flat affect  Data Reviewed: I have personally reviewed following labs and imaging studies CBC: Recent Labs  Lab 03/22/20 2045 03/23/20 0607 03/24/20 1018 03/11/2020 0704 03/26/20 0254  WBC 3.5* 3.4* 5.1 4.9 4.0  NEUTROABS 2.9 2.8 4.6 4.0 3.3  HGB 7.0* 7.8* 8.9* 8.1* 7.1*  HCT 20.3* 23.2* 25.7* 23.8* 21.4*  MCV 84.2 86.2 85.1 86.5 86.3  PLT 38* 41* 50* 40* 35*   Basic Metabolic Panel: Recent Labs  Lab 03/22/20 0101 03/23/20 0607 03/24/20 1018 03/13/2020 0704 03/26/20 0254  NA 125* 129* 130* 127* 127*  K 3.4* 4.0 3.8 3.7 3.8  CL 97* 102 102 100 100  CO2 19* 19* 16* 19* 17*  GLUCOSE 195* 266* 241* 205* 192*  BUN 40* 40* 38* 41* 35*  CREATININE 1.02 0.94 1.12 1.23 0.88  CALCIUM 8.1* 8.2* 8.6* 8.8* 9.1  MG  --  1.7  --   --   --   PHOS  --  2.7  --   --   --    GFR: Estimated Creatinine Clearance: 75.4 mL/min (by C-G  formula based on SCr of 0.88 mg/dL). Liver Function Tests: Recent Labs  Lab 03/22/20 0101 03/23/20 0607 03/24/20 1018 03/14/2020 0704 03/26/20 0254  AST 35 45* _0 ALT _1 ALKPHOS 73 70 78 79 70  BILITOT 0.5 0.2* 0.4 0.5 0.4  PROT 5.6* 5.7* 5.7* 5.6* 5.2*  ALBUMIN 1.4* 1.5* 1.6* 1.6* 1.5*   No results for input(s): LIPASE, AMYLASE in the last 168 hours. Recent Labs  Lab 03/22/20 0423  AMMONIA 45*   Coagulation Profile: Recent Labs  Lab 03/20/20 0201 03/20/20 1134  INR 1.4* 1.5*   Cardiac Enzymes: No results for input(s): CKTOTAL, CKMB, CKMBINDEX, TROPONINI in the last 168 hours. BNP (last 3 results) No results for input(s): PROBNP in the last 8760 hours. HbA1C: No results for input(s): HGBA1C in the last 72 hours. CBG: Recent Labs  Lab 03/10/2020 0714 03/20/2020 1109 03/18/2020 1651 03/29/2020 2155 03/26/20 0714  GLUCAP 189* 183* 258* 185* 153*   Lipid Profile: No results for input(s): CHOL, HDL, LDLCALC, TRIG, CHOLHDL, LDLDIRECT in the last 72 hours. Thyroid Function Tests: No results for input(s): TSH, T4TOTAL, FREET4, T3FREE, THYROIDAB in the last 72 hours. Anemia Panel: No results for input(s): VITAMINB12, FOLATE, FERRITIN, TIBC, IRON, RETICCTPCT in the last 72 hours. Sepsis Labs: Recent Labs  Lab 03/20/20 0040 03/22/20 0101 03/22/20 0423  LATICACIDVEN 1.4 1.4 1.2    Recent Results (from the past 240 hour(s))  Culture, Urine     Status: Abnormal   Collection Time: 03/31/2020  2:28 PM   Specimen: Urine, Random  Result Value Ref Range Status  Specimen Description URINE, RANDOM  Final   Special Requests NONE  Final   Culture (A)  Final    <10,000 COLONIES/mL INSIGNIFICANT GROWTH Performed at [facility], [place] [address] [place], [place] [address]    Report Status 03/20/2020 FINAL  Final  Culture, blood (routine x 2)     Status: Abnormal   Collection Time: 03/24/2020  6:02 PM   Specimen: BLOOD  Result Value Ref Range Status    Specimen Description BLOOD RIGHT ANTECUBITAL  Final   Special Requests   Final    BOTTLES DRAWN AEROBIC AND ANAEROBIC Blood Culture adequate volume   Culture  Setup Time   Final    GRAM POSITIVE COCCI IN CLUSTERS IN BOTH AEROBIC AND ANAEROBIC BOTTLES Organism ID to follow CRITICAL RESULT CALLED TO, READ BACK BY AND VERIFIED WITH: Barth Kirks PHARMD, AT [place] 03/20/20 BY Rush Landmark Performed at [facility], [place] [address] [place],  [address]    Culture STAPHYLOCOCCUS AUREUS (A)  Final   Report Status 03/22/2020 FINAL  Final   Organism ID, Bacteria STAPHYLOCOCCUS AUREUS  Final      Susceptibility   Staphylococcus aureus - MIC*    CIPROFLOXACIN <=0.5 SENSITIVE Sensitive     ERYTHROMYCIN <=0.25 SENSITIVE Sensitive     GENTAMICIN <=0.5 SENSITIVE Sensitive     OXACILLIN 0.5 SENSITIVE Sensitive     TETRACYCLINE <=1 SENSITIVE Sensitive     VANCOMYCIN 1 SENSITIVE Sensitive     TRIMETH/SULFA <=10 SENSITIVE Sensitive     CLINDAMYCIN <=0.25 SENSITIVE Sensitive     RIFAMPIN <=0.5 SENSITIVE Sensitive     Inducible Clindamycin NEGATIVE Sensitive     * STAPHYLOCOCCUS AUREUS  Blood Culture ID Panel (Reflexed)     Status: Abnormal   Collection Time: 03/09/2020  6:02 PM  Result Value Ref Range Status   Enterococcus faecalis NOT DETECTED NOT DETECTED Final   Enterococcus Faecium NOT DETECTED NOT DETECTED Final   Listeria monocytogenes NOT DETECTED NOT DETECTED Final   Staphylococcus species DETECTED (A) NOT DETECTED Final    Comment: CRITICAL RESULT CALLED TO, READ BACK BY AND VERIFIED WITH: Barth Kirks PHARMD, AT 0740 03/20/20 BY D. VANHOOK    Staphylococcus aureus (BCID) DETECTED (A) NOT DETECTED Final    Comment: CRITICAL RESULT CALLED TO, READ BACK BY AND VERIFIED WITH: Barth Kirks PHARMD, AT 0740 03/20/20 BY D. VANHOOK    Staphylococcus epidermidis NOT DETECTED NOT DETECTED Final   Staphylococcus lugdunensis NOT DETECTED NOT DETECTED Final   Streptococcus species NOT DETECTED NOT  DETECTED Final   Streptococcus agalactiae NOT DETECTED NOT DETECTED Final   Streptococcus pneumoniae NOT DETECTED NOT DETECTED Final   Streptococcus pyogenes NOT DETECTED NOT DETECTED Final   A.calcoaceticus-baumannii NOT DETECTED NOT DETECTED Final   Bacteroides fragilis NOT DETECTED NOT DETECTED Final   Enterobacterales NOT DETECTED NOT DETECTED Final   Enterobacter cloacae complex NOT DETECTED NOT DETECTED Final   Escherichia coli NOT DETECTED NOT DETECTED Final   Klebsiella aerogenes NOT DETECTED NOT DETECTED Final   Klebsiella oxytoca NOT DETECTED NOT DETECTED Final   Klebsiella pneumoniae NOT DETECTED NOT DETECTED Final   Proteus species NOT DETECTED NOT DETECTED Final   Salmonella species NOT DETECTED NOT DETECTED Final   Serratia marcescens NOT DETECTED NOT DETECTED Final   Haemophilus influenzae NOT DETECTED NOT DETECTED Final   Neisseria meningitidis NOT DETECTED NOT DETECTED Final   Pseudomonas aeruginosa NOT DETECTED NOT DETECTED Final   Stenotrophomonas maltophilia NOT DETECTED NOT DETECTED Final  Candida albicans NOT DETECTED NOT DETECTED Final   Candida auris NOT DETECTED NOT DETECTED Final   Candida glabrata NOT DETECTED NOT DETECTED Final   Candida krusei NOT DETECTED NOT DETECTED Final   Candida parapsilosis NOT DETECTED NOT DETECTED Final   Candida tropicalis NOT DETECTED NOT DETECTED Final   Cryptococcus neoformans/gattii NOT DETECTED NOT DETECTED Final   Meth resistant mecA/C and MREJ NOT DETECTED NOT DETECTED Final    Comment: Performed at [facility], [place] [address] [place], [place] [address]  SARS Coronavirus 2 by RT PCR (hospital order, performed in [facility] hospital lab) Nasopharyngeal Nasopharyngeal Swab     Status: None   Collection Time: 03/16/2020  8:37 PM   Specimen: Nasopharyngeal Swab  Result Value Ref Range Status   SARS Coronavirus 2 NEGATIVE NEGATIVE Final    Comment: (NOTE) SARS-CoV-2 target nucleic acids are NOT DETECTED.  The  SARS-CoV-2 RNA is generally detectable in upper and lower respiratory specimens during the acute phase of infection. The lowest concentration of SARS-CoV-2 viral copies this assay can detect is 250 copies / mL. A negative result does not preclude SARS-CoV-2 infection and should not be used as the sole basis for treatment or other patient management decisions.  A negative result may occur with improper specimen collection / handling, submission of specimen other than nasopharyngeal swab, presence of viral mutation(s) within the areas targeted by this assay, and inadequate number of viral copies (<250 copies / mL). A negative result must be combined with clinical observations, patient history, and epidemiological information.  Fact Sheet for Patients:   StrictlyIdeas.no  Fact Sheet for Healthcare Providers: BankingDealers.co.za  This test is not yet approved or  cleared by the [place] FDA and has been authorized for detection and/or diagnosis of SARS-CoV-2 by FDA under an Emergency Use Authorization (EUA).  This EUA will remain in effect (meaning this test can be used) for the duration of the COVID-19 declaration under Section 564(b)(1) of the Act, 21 U.S.C. section 360bbb-3(b)(1), unless the authorization is terminated or revoked sooner.  Performed at [facility], [place] [address], [place], [place] [address]   MRSA PCR Screening     Status: None   Collection Time: 03/20/20  4:21 AM   Specimen: Nasopharyngeal  Result Value Ref Range Status   MRSA by PCR NEGATIVE NEGATIVE Final    Comment:        The GeneXpert MRSA Assay (FDA approved for NASAL specimens only), is one component of a comprehensive MRSA colonization surveillance program. It is not intended to diagnose MRSA infection nor to guide or monitor treatment for MRSA infections. Performed at [facility], [place] [address], [place], [place] [address]     Respiratory Panel by PCR     Status: None   Collection Time: 03/20/20  4:21 AM   Specimen: Nasopharyngeal Swab; Respiratory  Result Value Ref Range Status   Adenovirus NOT DETECTED NOT DETECTED Final   Coronavirus 229E NOT DETECTED NOT DETECTED Final    Comment: (NOTE) The Coronavirus on the Respiratory Panel, DOES NOT test for the novel  Coronavirus (2019 nCoV)    Coronavirus HKU1 NOT DETECTED NOT DETECTED Final   Coronavirus NL63 NOT DETECTED NOT DETECTED Final   Coronavirus OC43 NOT DETECTED NOT DETECTED Final   Metapneumovirus NOT DETECTED NOT DETECTED Final   Rhinovirus / Enterovirus NOT DETECTED NOT DETECTED Final   Influenza A NOT DETECTED NOT DETECTED Final   Influenza B NOT DETECTED NOT DETECTED  Final   Parainfluenza Virus 1 NOT DETECTED NOT DETECTED Final   Parainfluenza Virus 2 NOT DETECTED NOT DETECTED Final   Parainfluenza Virus 3 NOT DETECTED NOT DETECTED Final   Parainfluenza Virus 4 NOT DETECTED NOT DETECTED Final   Respiratory Syncytial Virus NOT DETECTED NOT DETECTED Final   Bordetella pertussis NOT DETECTED NOT DETECTED Final   Chlamydophila pneumoniae NOT DETECTED NOT DETECTED Final   Mycoplasma pneumoniae NOT DETECTED NOT DETECTED Final    Comment: Performed at [facility], [place] [address], [place], [place] [address]  Culture, blood (Routine X 2) w Reflex to ID Panel     Status: None (Preliminary result)   Collection Time: 03/21/20  1:59 PM   Specimen: BLOOD RIGHT HAND  Result Value Ref Range Status   Specimen Description BLOOD RIGHT HAND  Final   Special Requests   Final    BOTTLES DRAWN AEROBIC AND ANAEROBIC Blood Culture results may not be optimal due to an excessive volume of blood received in culture bottles   Culture   Final    NO GROWTH 4 DAYS Performed at [facility], [place] [address], [place], [place] [address]    Report Status PENDING  Incomplete  Culture, blood (Routine X 2) w Reflex to ID Panel     Status: None (Preliminary result)    Collection Time: 03/21/20  1:59 PM   Specimen: BLOOD LEFT HAND  Result Value Ref Range Status   Specimen Description BLOOD LEFT HAND  Final   Special Requests   Final    BOTTLES DRAWN AEROBIC AND ANAEROBIC Blood Culture results may not be optimal due to an excessive volume of blood received in culture bottles   Culture   Final    NO GROWTH 4 DAYS Performed at [facility], [place] [address], [place], [place] [address]    Report Status PENDING  Incomplete     Radiology Studies: CT ABDOMEN PELVIS WO CONTRAST  Result Date: 03/24/2020 CLINICAL DATA:  Abdominal pain, abdominal distension EXAM: CT ABDOMEN AND PELVIS WITHOUT CONTRAST TECHNIQUE: Multidetector CT imaging of the abdomen and pelvis was performed following the standard protocol without IV contrast. Oral enteric contrast was administered. COMPARISON:  CT chest abdomen pelvis, 03/20/2020 FINDINGS: Lower chest: Dependent bibasilar atelectasis and/or consolidation with small bilateral pleural effusions, similar to prior examination. Hepatobiliary: No solid liver abnormality is seen. No gallstones, gallbladder wall thickening, or biliary dilatation. Pancreas: Unremarkable. No pancreatic ductal dilatation or surrounding inflammatory changes. Spleen: Normal in size without significant abnormality. Adrenals/Urinary Tract: Adrenal glands are unremarkable. Kidneys are normal, without renal calculi, solid lesion, or hydronephrosis. Bladder is unremarkable. Stomach/Bowel: Stomach is within normal limits. The small bowel and colon are diffusely gas distended, gas and stool present to the rectum, and degree of bowel distension somewhat increased compared to prior examination. Largest loops of mid small bowel measure up to 4.4 cm in caliber. The cecum measures up to 9.4 cm. Vascular/Lymphatic: No significant vascular findings are present. No enlarged abdominal or pelvic lymph nodes. Reproductive: No mass or other significant abnormality. Other: Small fat  and fluid containing bilateral inguinal hernias. Trace ascites throughout the abdomen and pelvis, new compared to prior examination. Musculoskeletal: There is redemonstrated, diffuse lytic osseous metastatic disease, most notable for a large lytic lesion and associated pathologic fracture of the T9 vertebral body. IMPRESSION: 1. The small bowel and colon are diffusely gas distended, gas and stool present to the rectum, and degree of bowel distension somewhat increased compared  to prior examination. Findings are most consistent with ileus. 2. Trace ascites throughout the abdomen and pelvis, new compared to prior examination. 3. Dependent bibasilar atelectasis and/or consolidation of the included lung bases with small bilateral pleural effusions, similar to prior examination. 4. There is redemonstrated, diffuse lytic osseous metastatic disease, most notable for a large lytic lesion and associated pathologic fracture of the T9 vertebral body. Findings are in keeping with multiple myeloma. Electronically Signed   By: Eddie Candle M.D.   On: 03/24/2020 17:21   DG Abd Portable 1V  Result Date: 03/18/2020 CLINICAL DATA:  Abdominal distention, ileus EXAM: PORTABLE ABDOMEN - 1 VIEW COMPARISON:  03/13/2020 FINDINGS: Diffuse gaseous distention of bowel, stable or worsening since prior study. No visible free air organomegaly. IMPRESSION: Stable or slight worsening diffuse gaseous distention of bowel compatible with ileus. Electronically Signed   By: Rolm Baptise M.D.   On: 03/09/2020 05:54     LOS: 7 days   Domenic Polite, MD [facility]  03/26/2020, 11:01 AM

## 2020-03-27 ENCOUNTER — Encounter (HOSPITAL_COMMUNITY): Payer: Self-pay | Admitting: Internal Medicine

## 2020-03-27 ENCOUNTER — Inpatient Hospital Stay (HOSPITAL_COMMUNITY): Payer: Self-pay

## 2020-03-27 LAB — COMPREHENSIVE METABOLIC PANEL
ALT: 12 U/L (ref 0–44)
AST: 30 U/L (ref 15–41)
Albumin: 1.5 g/dL — ABNORMAL LOW (ref 3.5–5.0)
Alkaline Phosphatase: 73 U/L (ref 38–126)
Anion gap: 7 (ref 5–15)
BUN: 21 mg/dL — ABNORMAL HIGH (ref 6–20)
CO2: 21 mmol/L — ABNORMAL LOW (ref 22–32)
Calcium: 8.8 mg/dL — ABNORMAL LOW (ref 8.9–10.3)
Chloride: 96 mmol/L — ABNORMAL LOW (ref 98–111)
Creatinine, Ser: 0.63 mg/dL (ref 0.61–1.24)
GFR calc Af Amer: >60 mL/min (ref >60)
GFR calc non Af Amer: >60 mL/min (ref >60)
Glucose, Bld: 138 mg/dL — ABNORMAL HIGH (ref 70–99)
Potassium: 3.6 mmol/L (ref 3.5–5.1)
Sodium: 124 mmol/L — ABNORMAL LOW (ref 135–145)
Total Bilirubin: 0.6 mg/dL (ref 0.3–1.2)
Total Protein: 5.2 g/dL — ABNORMAL LOW (ref 6.5–8.1)

## 2020-03-27 LAB — GLUCOSE, CAPILLARY
Glucose-Capillary: 113 mg/dL — ABNORMAL HIGH (ref 70–99)
Glucose-Capillary: 137 mg/dL — ABNORMAL HIGH (ref 70–99)
Glucose-Capillary: 147 mg/dL — ABNORMAL HIGH (ref 70–99)
Glucose-Capillary: 213 mg/dL — ABNORMAL HIGH (ref 70–99)

## 2020-03-27 LAB — PREPARE RBC (CROSSMATCH)

## 2020-03-27 MED ORDER — LACTATED RINGERS IV SOLN: INTRAVENOUS | Status: DC

## 2020-03-27 MED ORDER — SODIUM CHLORIDE 0.9% IV SOLUTION: Freq: Once | INTRAVENOUS | Status: AC

## 2020-03-27 MED ORDER — SODIUM CHLORIDE 0.9 % IV SOLN
INTRAVENOUS | Status: DC | PRN
  Administered 2020-03-27 – 2020-04-01 (×2): 250 mL via INTRAVENOUS

## 2020-03-27 MED ORDER — FUROSEMIDE 10 MG/ML IJ SOLN
40.0000 mg | Freq: Once | INTRAMUSCULAR | Status: AC
  Administered 2020-03-27: 40 mg via INTRAVENOUS
  Filled 2020-03-27: qty 4

## 2020-03-27 MED ORDER — LACTULOSE 10 GM/15ML PO SOLN
20.0000 g | Freq: Three times a day (TID) | ORAL | Status: DC
  Administered 2020-03-27 – 2020-04-05 (×10): 20 g via ORAL
  Filled 2020-03-27 (×12): qty 30

## 2020-03-27 MED ORDER — IOHEXOL 350 MG/ML SOLN
60.0000 mL | Freq: Once | INTRAVENOUS | Status: AC | PRN
  Administered 2020-03-27: 60 mL via INTRAVENOUS

## 2020-03-27 MED ORDER — FUROSEMIDE 10 MG/ML IJ SOLN
20.0000 mg | Freq: Once | INTRAMUSCULAR | Status: AC
  Administered 2020-03-28: 20 mg via INTRAVENOUS
  Filled 2020-03-27: qty 2

## 2020-03-27 MED ORDER — MIDODRINE HCL 5 MG PO TABS
5.0000 mg | ORAL_TABLET | Freq: Two times a day (BID) | ORAL | Status: DC
  Administered 2020-03-28 – 2020-03-31 (×5): 5 mg via ORAL
  Filled 2020-03-27 (×6): qty 1

## 2020-03-27 NOTE — Progress Notes (Signed)
PROGRESS NOTE    Shafiq Larch  HEN:[id number] DOB: [birth year] DOA: 03/20/2020 PCP: Patient, No Pcp Per   Brief Narrative: 54/M with very long complex hospitalization was admitted to [facility] long hospital on 02/14/20 with back pain was found to have diffuse lytic lesions throughout the skeleton, epidural tumor extension with pathological fracture of T6 and T7, contrast-enhancing mass replacing most of T5 vertebral body with severe cord compression, and acute renal failure, required couple of hemodialysis sessions, kidney function improved. -Followed by medical and radiation oncology, treated with IV Decadron and has completed 10/10 doses of XRT.  Work-up consistent with newly diagnosed IgG kappa MM with plans for CyBorD chemo to start following completion of XRT.   -Following transfer to rehab, he underwent chemotherapy on 03/15/2020 and next chemotherapy was scheduled for 03/22/2020.  Patient had significant nosebleed on 03/17/2020.  -Subsequently spiked fevers with T-max 102 -blood culture grew MSSA bacteremia, started on IV Ancef seen by ID as well for this -During this hospitalization on a CT he was also noted to have a 5 x 2 cm left upper lobe mass of unclear etiology in addition to other findings of diffuse metastatic myeloma -9/13: Ongoing epistaxis platelet count down to 25K transfused 1 unit of platelets -9/13 night lethargic less responsive, seen by neurology, suspicion for seizure-like activity, started on Keppra, EEG negative, MRI noted acute intracranial hemorrhage in the right frontal convexity, followed by neurology for this Mental status slowly improving -Now with ileus, diffuse pain   Assessment & Plan:  Acute intracranial hemorrhage  -involving the anterior-inferior right frontal convexity likely due to severe thrombocytopenia from effect of multiple myeloma and chemotherapy.  -Repeat CT head decreased size of hemorrhage -Appreciate neurology input, recommended baby aspirin when  platelet count is higher and cleared by oncology -Monitor platelet count  Severe Sepsis  -MSSA bacteremia:  -Hospital-acquired MSSA bacteremia, clinically improved quickly after antibiotics seen by infectious disease for same  -Repeat blood cultures are negative  -2D echocardiogram was unremarkable, no central lines at that time  -TEE deferred especially in the setting of severe thrombocytopenia and complicated hospitalization -ID recommended to continue Ancef through 9/27  Metastatic multiple myeloma Epidural tumor extension at T6 and T9-> compressive myelopathy Hypercalcemia: -treated with radiation of spine due to concern for impending cord compression- MRI 8.8.21: Epidural tumor extension at T6 and T9 and T6 lesion severely narrowed thecal sac and deformed the spinal cord . He had velcade and dexamethasone, acyclovir, diflucan.  Oncology following closely continue current plan.  Chemotherapy on hold given acute infection -Will discuss prognosis with oncology, appears to be at high risk of ongoing complications, albumin is 1.5 -Remains on Decadron every other day and prophylactic acyclovir  Pancytopenia/ Anemia/severe Thrombcytopenia -secondary to metastatic myeloma and recent chemo  -S/p 1 unit platelet transfusion 9/13.  -Also transfused 2 units of PRBC this admission -Hemoglobin down to 7.1 this morning, monitor and transfuse if less than 7  Epistaxis-resolved.   -Continue Afrin-prn  Acute encephalopathy multifatorial-likely multifactorial in the setting of sepsis, stroke.mri brain w/ small bleed, and also w/ MSSA bacteremia/sepsis, hyponatremia.   -There is question of seizure so was transferred to neuro ICU 9/14 and is on Keppra.EEG showing moderate encephalopathy no epileptiform discharges.  On 9/15, had a staring episode seen by neurology and EEG stat no acute finding.  Has improved since then.  Chest pain/diffuse body pain likely from likely from myeloma.  Serial troponin  negative continue pain control.    Left upper lobe with  5.2/1.5 cm pleural based mass- concern for primary lung malignancy versus plasmacytoma per CT. followed by oncology closely.  Interventional radiology planning for pleural biopsy, hopefully Monday.  Hyponatremia: Suspect multifactorial - TSH cortisol okay.   -Worsening of sodium noted, diuretics on hold -Remains on normal saline at 30 mL/h, stopped this in a few hours -Continue to trend, asymptomatic  AKI/Short term HD in august 2021:  -Renal function improved and stable  T2DM-newly diagnosed in august 2021. a1c 6.5.  -CBGs fairly stable now continue Lantus and sliding scale insulin  Chronic constipation Ileus -Having BMs now, 2 days ago -Continue laxatives  HTN/HLD- bp meds amlodipine on hold, on coreg low dose and is on Midodrine- BP stable -Wean off midodrine  Severe hypoalbuminemia -Could be secondary to underlying malignancy and complicated hospitalization  Weakness/debility/physical deconditioning: Continue PT OT, suspect multifactorial in the setting of multiple illness new cancer chemotherapy.  Return to inpatient rehab eventually.  DVT prophylaxis: SCDs Start: 03/20/20 0020 Code Status:   Code Status: DNR , after discussion w/ pt and wife today, Spanish interpreter used Family Communication: Plan of care discussed with patient and wife at bedside with iPad Spanish interpreter.  Status is: Inpatient Remains inpatient appropriate because:Ongoing diagnostic testing needed not appropriate for outpatient work up and Remains hospitalized due to ongoing management of MSSA sepsis, pancytopenia  Dispo: The patient is from: CIR              Anticipated d/c is to: TBD.  Transfer to medical floor              Anticipated d/c date is: > 3 days              Patient currently is not medically stable to d/c. Nutrition: Diet Order            Diet full liquid Room service appropriate? Yes; Fluid consistency: Thin  Diet  effective now                Body mass index is 27.01 kg/m.  Consultants:see note  Procedures: see note.  CT chest abd pelvis w/ contrast: Mild to moderate severity bilateral areas of atelectasis and/or infiltrate. 2. 5.2 cm x 1.5 cm pleural based soft tissue mass along the anterior aspect of the left upper lobe. A 1.4 cm pleural based soft tissue nodule is seen within this region on the prior study. These findings are concerning for the presence of a primary lung malignancy. Further evaluation with a nuclear medicine PET/CT is recommended. 3. Findings consistent with multiple myeloma versus diffuse osseous metastasis, with chronic pathologic fractures at the levels of T6 and T9 vertebral bodies. 4. Markedly enlarged prostate gland. 5. 4.0 cm x 3.2 cm fat-containing right scrotal hernia. 6. Mild cardiomegaly. 7. Small hiatal hernia. 8. Aortic atherosclerosis. 9. Small simple cyst within the left kidney.  Microbiology:see note Blood Culture    Component Value Date/Time   SDES BLOOD RIGHT HAND 03/21/2020 1359   SDES BLOOD LEFT HAND 03/21/2020 1359   SPECREQUEST  03/21/2020 1359    BOTTLES DRAWN AEROBIC AND ANAEROBIC Blood Culture results may not be optimal due to an excessive volume of blood received in culture bottles   SPECREQUEST  03/21/2020 1359    BOTTLES DRAWN AEROBIC AND ANAEROBIC Blood Culture results may not be optimal due to an excessive volume of blood received in culture bottles   CULT  03/21/2020 1359    NO GROWTH 5 DAYS Performed at [facility], [place]  [address], [place], [place] [address]    CULT  03/21/2020 1359    NO GROWTH 5 DAYS Performed at [facility], [place] [address], [place], [place] [address]    REPTSTATUS 03/26/2020 FINAL 03/21/2020 1359   REPTSTATUS 03/26/2020 FINAL 03/21/2020 1359    Other culture-see note  Medications: Scheduled Meds:  (feeding supplement) PROSource Plus  30 mL Oral BID BM   sodium chloride   Intravenous  Once   acyclovir  400 mg Oral Daily   carvedilol  3.125 mg Oral BID WC   Chlorhexidine Gluconate Cloth  6 each Topical Daily   dexamethasone (DECADRON) injection  2 mg Intravenous QODAY   insulin aspart  0-9 Units Subcutaneous TID WC   insulin aspart  3 Units Subcutaneous TID WC   insulin glargine  15 Units Subcutaneous QHS   lactulose  20 g Oral TID   levETIRAcetam  500 mg Oral BID   lidocaine  2 patch Transdermal Q24H   melatonin  3 mg Oral QHS   midodrine  2.5 mg Oral BID WC   pantoprazole  40 mg Oral BID AC   polyethylene glycol  17 g Oral Daily   Ensure Max Protein  11 oz Oral BID BM   QUEtiapine  12.5 mg Oral QHS   senna-docusate  2 tablet Oral BID   sodium chloride  2 spray Each Nare Q1H while awake   sucralfate  1 g Oral TID WC & HS   Continuous Infusions:   ceFAZolin (ANCEF) IV 2 g (03/27/20 0636)   lactated ringers 30 mL/hr at 03/26/20 1600    Antimicrobials: Anti-infectives (From admission, onward)   Start     Dose/Rate Route Frequency Ordered Stop   03/20/20 1600  ceFAZolin (ANCEF) IVPB 2g/100 mL premix        2 g 200 mL/hr over 30 Minutes Intravenous Every 8 hours 03/20/20 1505     03/20/20 1000  acyclovir (ZOVIRAX) 200 MG capsule 400 mg        400 mg Oral Daily 04/01/2020 2217     03/20/20 1000  fluconazole (DIFLUCAN) tablet 100 mg        100 mg Oral Daily 04/07/2020 2217 03/23/20 0959   03/20/20 0800  vancomycin (VANCOREADY) IVPB 750 mg/150 mL  Status:  Discontinued        750 mg 150 mL/hr over 60 Minutes Intravenous Every 12 hours 03/17/2020 2217 03/20/20 1452   03/20/20 0800  ceFEPIme (MAXIPIME) 2 g in sodium chloride 0.9 % 100 mL IVPB  Status:  Discontinued        2 g 200 mL/hr over 30 Minutes Intravenous Every 8 hours 03/20/20 0001 03/20/20 1452   03/20/20 0015  ceFEPIme (MAXIPIME) 2 g in sodium chloride 0.9 % 100 mL IVPB        2 g 200 mL/hr over 30 Minutes Intravenous  Once 03/20/20 0001 03/20/20 0159   03/14/2020 2230  vancomycin  (VANCOREADY) IVPB 1250 mg/250 mL  Status:  Discontinued        1,250 mg 166.7 mL/hr over 90 Minutes Intravenous  Once 03/27/2020 2217 03/24/2020 2225   03/11/2020 2224  piperacillin-tazobactam (ZOSYN) IVPB 3.375 g  Status:  Discontinued        3.375 g 12.5 mL/hr over 240 Minutes Intravenous Every 8 hours 03/10/2020 2217 03/20/20 0000     Objective: Vitals: Today's Vitals   03/27/20 0800 03/27/20 0815 03/27/20 0900 03/27/20 1100  BP:    117/70  Pulse:  Resp:      Temp:    98.5 F (36.9 C)  TempSrc:    Oral  SpO2: 97% 97% 95%   Weight:      PainSc:        Intake/Output Summary (Last 24 hours) at 03/27/2020 1415 Last data filed at 03/27/2020 1300 Gross per 24 hour  Intake 1535.34 ml  Output 1850 ml  Net -314.66 ml   Filed Weights   04/04/2020 0500 03/26/20 0500 03/27/20 0419  Weight: 58 kg 61 kg 69.2 kg   Weight change: 8.174 kg  Intake/Output from previous day: 09/18 0701 - 09/19 0700 In: 1826.1 [P.O.:720; I.V.:897.2; IV Piggyback:209] Out: 1075 [Urine:1075] Intake/Output this shift: Total I/O In: 240 [P.O.:240] Out: 1200 [Urine:1200]  Examination: General exam: Chronically ill middle-aged male appears much older than stated age, uncomfortable appearing, awake alert oriented x2, no distress HEENT: Oral mucosa is moist, neck no JVD CVS: S1-S2, regular rate rhythm Lungs: Decreased breath sounds to bases Abdomen: Firm, mildly distended, bowel sounds present but diminished Extremities: Trace edema  Psych: Flat affect  Data Reviewed: I have personally reviewed following labs and imaging studies CBC: Recent Labs  Lab 03/22/20 2045 03/23/20 0607 03/24/20 1018 03/26/2020 0704 03/26/20 0254  WBC 3.5* 3.4* 5.1 4.9 4.0  NEUTROABS 2.9 2.8 4.6 4.0 3.3  HGB 7.0* 7.8* 8.9* 8.1* 7.1*  HCT 20.3* 23.2* 25.7* 23.8* 21.4*  MCV 84.2 86.2 85.1 86.5 86.3  PLT 38* 41* 50* 40* 35*   Basic Metabolic Panel: Recent Labs  Lab 03/23/20 0607 03/24/20 1018 03/18/2020 0704 03/26/20 0254  03/27/20 0139  NA 129* 130* 127* 127* 124*  K 4.0 3.8 3.7 3.8 3.6  CL 102 102 100 100 96*  CO2 19* 16* 19* 17* 21*  GLUCOSE 266* 241* 205* 192* 138*  BUN 40* 38* 41* 35* 21*  CREATININE 0.94 1.12 1.23 0.88 0.63  CALCIUM 8.2* 8.6* 8.8* 9.1 8.8*  MG 1.7  --   --   --   --   PHOS 2.7  --   --   --   --    GFR: Estimated Creatinine Clearance: 90.1 mL/min (by C-G formula based on SCr of 0.63 mg/dL). Liver Function Tests: Recent Labs  Lab 03/23/20 0607 03/24/20 1018 03/27/2020 0704 03/26/20 0254 03/27/20 0139  AST 45* '29 28 29 30  ' ALT '29 18 15 13 12  ' ALKPHOS 70 78 79 70 73  BILITOT 0.2* 0.4 0.5 0.4 0.6  PROT 5.7* 5.7* 5.6* 5.2* 5.2*  ALBUMIN 1.5* 1.6* 1.6* 1.5* 1.5*   No results for input(s): LIPASE, AMYLASE in the last 168 hours. Recent Labs  Lab 03/22/20 0423  AMMONIA 45*   Coagulation Profile: No results for input(s): INR, PROTIME in the last 168 hours. Cardiac Enzymes: No results for input(s): CKTOTAL, CKMB, CKMBINDEX, TROPONINI in the last 168 hours. BNP (last 3 results) No results for input(s): PROBNP in the last 8760 hours. HbA1C: No results for input(s): HGBA1C in the last 72 hours. CBG: Recent Labs  Lab 03/26/20 1108 03/26/20 1628 03/26/20 2322 03/27/20 0742 03/27/20 1058  GLUCAP 173* 156* 164* 113* 137*   Lipid Profile: No results for input(s): CHOL, HDL, LDLCALC, TRIG, CHOLHDL, LDLDIRECT in the last 72 hours. Thyroid Function Tests: No results for input(s): TSH, T4TOTAL, FREET4, T3FREE, THYROIDAB in the last 72 hours. Anemia Panel: No results for input(s): VITAMINB12, FOLATE, FERRITIN, TIBC, IRON, RETICCTPCT in the last 72 hours. Sepsis Labs: Recent Labs  Lab 03/22/20 0101 03/22/20 0423  LATICACIDVEN 1.4  1.2    Recent Results (from the past 240 hour(s))  Culture, Urine     Status: Abnormal   Collection Time: 03/17/2020  2:28 PM   Specimen: Urine, Random  Result Value Ref Range Status   Specimen Description URINE, RANDOM  Final   Special  Requests NONE  Final   Culture (A)  Final    <10,000 COLONIES/mL INSIGNIFICANT GROWTH Performed at [facility], [address] [place], [place] [address]    Report Status 03/20/2020 FINAL  Final  Culture, blood (routine x 2)     Status: Abnormal   Collection Time: 04/05/2020  6:02 PM   Specimen: BLOOD  Result Value Ref Range Status   Specimen Description BLOOD RIGHT ANTECUBITAL  Final   Special Requests   Final    BOTTLES DRAWN AEROBIC AND ANAEROBIC Blood Culture adequate volume   Culture  Setup Time   Final    GRAM POSITIVE COCCI IN CLUSTERS IN BOTH AEROBIC AND ANAEROBIC BOTTLES Organism ID to follow CRITICAL RESULT CALLED TO, READ BACK BY AND VERIFIED WITH: Barth Kirks PHARMD, AT [place] 03/20/20 BY Rush Landmark Performed at [facility], [place] [address], [place], [place] [address]    Culture STAPHYLOCOCCUS AUREUS (A)  Final   Report Status 03/22/2020 FINAL  Final   Organism ID, Bacteria STAPHYLOCOCCUS AUREUS  Final      Susceptibility   Staphylococcus aureus - MIC*    CIPROFLOXACIN <=0.5 SENSITIVE Sensitive     ERYTHROMYCIN <=0.25 SENSITIVE Sensitive     GENTAMICIN <=0.5 SENSITIVE Sensitive     OXACILLIN 0.5 SENSITIVE Sensitive     TETRACYCLINE <=1 SENSITIVE Sensitive     VANCOMYCIN 1 SENSITIVE Sensitive     TRIMETH/SULFA <=10 SENSITIVE Sensitive     CLINDAMYCIN <=0.25 SENSITIVE Sensitive     RIFAMPIN <=0.5 SENSITIVE Sensitive     Inducible Clindamycin NEGATIVE Sensitive     * STAPHYLOCOCCUS AUREUS  Blood Culture ID Panel (Reflexed)     Status: Abnormal   Collection Time: 03/12/2020  6:02 PM  Result Value Ref Range Status   Enterococcus faecalis NOT DETECTED NOT DETECTED Final   Enterococcus Faecium NOT DETECTED NOT DETECTED Final   Listeria monocytogenes NOT DETECTED NOT DETECTED Final   Staphylococcus species DETECTED (A) NOT DETECTED Final    Comment: CRITICAL RESULT CALLED TO, READ BACK BY AND VERIFIED WITH: Barth Kirks PHARMD, AT 0740 03/20/20 BY D. VANHOOK     Staphylococcus aureus (BCID) DETECTED (A) NOT DETECTED Final    Comment: CRITICAL RESULT CALLED TO, READ BACK BY AND VERIFIED WITH: Barth Kirks PHARMD, AT 0740 03/20/20 BY D. VANHOOK    Staphylococcus epidermidis NOT DETECTED NOT DETECTED Final   Staphylococcus lugdunensis NOT DETECTED NOT DETECTED Final   Streptococcus species NOT DETECTED NOT DETECTED Final   Streptococcus agalactiae NOT DETECTED NOT DETECTED Final   Streptococcus pneumoniae NOT DETECTED NOT DETECTED Final   Streptococcus pyogenes NOT DETECTED NOT DETECTED Final   A.calcoaceticus-baumannii NOT DETECTED NOT DETECTED Final   Bacteroides fragilis NOT DETECTED NOT DETECTED Final   Enterobacterales NOT DETECTED NOT DETECTED Final   Enterobacter cloacae complex NOT DETECTED NOT DETECTED Final   Escherichia coli NOT DETECTED NOT DETECTED Final   Klebsiella aerogenes NOT DETECTED NOT DETECTED Final   Klebsiella oxytoca NOT DETECTED NOT DETECTED Final   Klebsiella pneumoniae NOT DETECTED NOT DETECTED Final   Proteus species NOT DETECTED NOT DETECTED Final   Salmonella species NOT DETECTED NOT DETECTED Final   Serratia marcescens NOT  DETECTED NOT DETECTED Final   Haemophilus influenzae NOT DETECTED NOT DETECTED Final   Neisseria meningitidis NOT DETECTED NOT DETECTED Final   Pseudomonas aeruginosa NOT DETECTED NOT DETECTED Final   Stenotrophomonas maltophilia NOT DETECTED NOT DETECTED Final   Candida albicans NOT DETECTED NOT DETECTED Final   Candida auris NOT DETECTED NOT DETECTED Final   Candida glabrata NOT DETECTED NOT DETECTED Final   Candida krusei NOT DETECTED NOT DETECTED Final   Candida parapsilosis NOT DETECTED NOT DETECTED Final   Candida tropicalis NOT DETECTED NOT DETECTED Final   Cryptococcus neoformans/gattii NOT DETECTED NOT DETECTED Final   Meth resistant mecA/C and MREJ NOT DETECTED NOT DETECTED Final    Comment: Performed at [facility], [place] [address], [place], [place] [address]  SARS Coronavirus 2  by RT PCR (hospital order, performed in [facility] hospital lab) Nasopharyngeal Nasopharyngeal Swab     Status: None   Collection Time: 03/31/2020  8:37 PM   Specimen: Nasopharyngeal Swab  Result Value Ref Range Status   SARS Coronavirus 2 NEGATIVE NEGATIVE Final    Comment: (NOTE) SARS-CoV-2 target nucleic acids are NOT DETECTED.  The SARS-CoV-2 RNA is generally detectable in upper and lower respiratory specimens during the acute phase of infection. The lowest concentration of SARS-CoV-2 viral copies this assay can detect is 250 copies / mL. A negative result does not preclude SARS-CoV-2 infection and should not be used as the sole basis for treatment or other patient management decisions.  A negative result may occur with improper specimen collection / handling, submission of specimen other than nasopharyngeal swab, presence of viral mutation(s) within the areas targeted by this assay, and inadequate number of viral copies (<250 copies / mL). A negative result must be combined with clinical observations, patient history, and epidemiological information.  Fact Sheet for Patients:   StrictlyIdeas.no  Fact Sheet for Healthcare Providers: BankingDealers.co.za  This test is not yet approved or  cleared by the [place] FDA and has been authorized for detection and/or diagnosis of SARS-CoV-2 by FDA under an Emergency Use Authorization (EUA).  This EUA will remain in effect (meaning this test can be used) for the duration of the COVID-19 declaration under Section 564(b)(1) of the Act, 21 U.S.C. section 360bbb-3(b)(1), unless the authorization is terminated or revoked sooner.  Performed at [facility], [place] [address], [place], [place] [address]   MRSA PCR Screening     Status: None   Collection Time: 03/20/20  4:21 AM   Specimen: Nasopharyngeal  Result Value Ref Range Status   MRSA by PCR NEGATIVE NEGATIVE Final    Comment:         The GeneXpert MRSA Assay (FDA approved for NASAL specimens only), is one component of a comprehensive MRSA colonization surveillance program. It is not intended to diagnose MRSA infection nor to guide or monitor treatment for MRSA infections. Performed at [facility], [place] [address] [place], [place] [address]   Respiratory Panel by PCR     Status: None   Collection Time: 03/20/20  4:21 AM   Specimen: Nasopharyngeal Swab; Respiratory  Result Value Ref Range Status   Adenovirus NOT DETECTED NOT DETECTED Final   Coronavirus 229E NOT DETECTED NOT DETECTED Final    Comment: (NOTE) The Coronavirus on the Respiratory Panel, DOES NOT test for the novel  Coronavirus (2019 nCoV)    Coronavirus HKU1 NOT DETECTED NOT DETECTED Final   Coronavirus NL63 NOT DETECTED NOT DETECTED Final   Coronavirus  OC43 NOT DETECTED NOT DETECTED Final   Metapneumovirus NOT DETECTED NOT DETECTED Final   Rhinovirus / Enterovirus NOT DETECTED NOT DETECTED Final   Influenza A NOT DETECTED NOT DETECTED Final   Influenza B NOT DETECTED NOT DETECTED Final   Parainfluenza Virus 1 NOT DETECTED NOT DETECTED Final   Parainfluenza Virus 2 NOT DETECTED NOT DETECTED Final   Parainfluenza Virus 3 NOT DETECTED NOT DETECTED Final   Parainfluenza Virus 4 NOT DETECTED NOT DETECTED Final   Respiratory Syncytial Virus NOT DETECTED NOT DETECTED Final   Bordetella pertussis NOT DETECTED NOT DETECTED Final   Chlamydophila pneumoniae NOT DETECTED NOT DETECTED Final   Mycoplasma pneumoniae NOT DETECTED NOT DETECTED Final    Comment: Performed at [facility], [place] [address], [place], [place] [address]  Culture, blood (Routine X 2) w Reflex to ID Panel     Status: None   Collection Time: 03/21/20  1:59 PM   Specimen: BLOOD RIGHT HAND  Result Value Ref Range Status   Specimen Description BLOOD RIGHT HAND  Final   Special Requests   Final    BOTTLES DRAWN AEROBIC AND ANAEROBIC Blood Culture results may not be  optimal due to an excessive volume of blood received in culture bottles   Culture   Final    NO GROWTH 5 DAYS Performed at [facility], [place] [address] [place], [place] [address]    Report Status 03/26/2020 FINAL  Final  Culture, blood (Routine X 2) w Reflex to ID Panel     Status: None   Collection Time: 03/21/20  1:59 PM   Specimen: BLOOD LEFT HAND  Result Value Ref Range Status   Specimen Description BLOOD LEFT HAND  Final   Special Requests   Final    BOTTLES DRAWN AEROBIC AND ANAEROBIC Blood Culture results may not be optimal due to an excessive volume of blood received in culture bottles   Culture   Final    NO GROWTH 5 DAYS Performed at [facility], [place] [address], [place], [place] [address]    Report Status 03/26/2020 FINAL  Final     Radiology Studies: No results found.   LOS: 8 days   Domenic Polite, MD [facility]  03/27/2020, 2:15 PM

## 2020-03-27 NOTE — Progress Notes (Signed)
4:19 temp 100.4 4:31 650 mg PO tylenol administered 6:31 temp 99.8  [facility] made aware via page

## 2020-03-27 NOTE — Progress Notes (Signed)
CRITICAL VALUE ALERT  Critical Value:  Hemoglobin 6.2, platelets 25  Date & Time Notied:  03/27/2020 @ 0761  Provider Notified: Dr. Broadus John  Orders Received/Actions taken: orders for 1 unit PRBC

## 2020-03-27 NOTE — Progress Notes (Signed)
Patient with increasing shortness of breath and labored breathing after eating. Sats 96% on room air. Tachycardic to 115. Notified Dr. Broadus John via phone; orders received for 40mg  IV lasix x1, then an additional 20mg  IV lasix after blood transfusion.

## 2020-03-28 ENCOUNTER — Inpatient Hospital Stay (HOSPITAL_COMMUNITY): Payer: Self-pay

## 2020-03-28 LAB — TYPE AND SCREEN
ABO/RH(D): O POS
Antibody Screen: NEGATIVE
Unit division: 0

## 2020-03-28 LAB — BASIC METABOLIC PANEL
Anion gap: 8 (ref 5–15)
BUN: 16 mg/dL (ref 6–20)
CO2: 25 mmol/L (ref 22–32)
Calcium: 8.7 mg/dL — ABNORMAL LOW (ref 8.9–10.3)
Chloride: 93 mmol/L — ABNORMAL LOW (ref 98–111)
Creatinine, Ser: 0.62 mg/dL (ref 0.61–1.24)
GFR calc Af Amer: >60 mL/min (ref >60)
GFR calc non Af Amer: >60 mL/min (ref >60)
Glucose, Bld: 146 mg/dL — ABNORMAL HIGH (ref 70–99)
Potassium: 3.1 mmol/L — ABNORMAL LOW (ref 3.5–5.1)
Sodium: 126 mmol/L — ABNORMAL LOW (ref 135–145)

## 2020-03-28 LAB — CBC
HCT: 23.4 % — ABNORMAL LOW (ref 39.0–52.0)
Hemoglobin: 8.1 g/dL — ABNORMAL LOW (ref 13.0–17.0)
MCH: 29.7 pg (ref 26.0–34.0)
MCHC: 34.6 g/dL (ref 30.0–36.0)
MCV: 85.7 fL (ref 80.0–100.0)
Platelets: 38 10*3/uL — ABNORMAL LOW (ref 150–400)
RBC: 2.73 MIL/uL — ABNORMAL LOW (ref 4.22–5.81)
RDW: 13.4 % (ref 11.5–15.5)
WBC: 5.5 10*3/uL (ref 4.0–10.5)
nRBC: 0.9 % — ABNORMAL HIGH (ref 0.0–0.2)

## 2020-03-28 LAB — BPAM RBC
Blood Product Expiration Date: 202109212359
ISSUE DATE / TIME: 202109192035
Unit Type and Rh: 9500

## 2020-03-28 LAB — CBC WITH DIFFERENTIAL/PLATELET
Abs Immature Granulocytes: 0 10*3/uL (ref 0.00–0.07)
Basophils Absolute: 0 10*3/uL (ref 0.0–0.1)
Basophils Relative: 0 %
Eosinophils Absolute: 0 10*3/uL (ref 0.0–0.5)
Eosinophils Relative: 1 %
HCT: 18.1 % — ABNORMAL LOW (ref 39.0–52.0)
Hemoglobin: 6.2 g/dL — CL (ref 13.0–17.0)
Lymphocytes Relative: 4 %
Lymphs Abs: 0.2 10*3/uL — ABNORMAL LOW (ref 0.7–4.0)
MCH: 30.1 pg (ref 26.0–34.0)
MCHC: 34.3 g/dL (ref 30.0–36.0)
MCV: 87.9 fL (ref 80.0–100.0)
Monocytes Absolute: 0 10*3/uL — ABNORMAL LOW (ref 0.1–1.0)
Monocytes Relative: 1 %
Neutro Abs: 4 10*3/uL (ref 1.7–7.7)
Neutrophils Relative %: 93 %
Platelets: 25 10*3/uL — CL (ref 150–400)
Promyelocytes Relative: 1 %
RBC: 2.06 MIL/uL — ABNORMAL LOW (ref 4.22–5.81)
RDW: 13.2 % (ref 11.5–15.5)
WBC: 4.3 10*3/uL (ref 4.0–10.5)
nRBC: 1.2 % — ABNORMAL HIGH (ref 0.0–0.2)
nRBC: 2 /100 WBC — ABNORMAL HIGH

## 2020-03-28 LAB — GLUCOSE, CAPILLARY
Glucose-Capillary: 139 mg/dL — ABNORMAL HIGH (ref 70–99)
Glucose-Capillary: 163 mg/dL — ABNORMAL HIGH (ref 70–99)
Glucose-Capillary: 212 mg/dL — ABNORMAL HIGH (ref 70–99)
Glucose-Capillary: 168 mg/dL — ABNORMAL HIGH (ref 70–99)

## 2020-03-28 LAB — HEMOGLOBIN AND HEMATOCRIT, BLOOD
HCT: 22.7 % — ABNORMAL LOW (ref 39.0–52.0)
Hemoglobin: 7.7 g/dL — ABNORMAL LOW (ref 13.0–17.0)

## 2020-03-28 MED ORDER — SODIUM CHLORIDE 0.9% IV SOLUTION: Freq: Once | INTRAVENOUS | Status: AC

## 2020-03-28 MED ORDER — POTASSIUM CHLORIDE CRYS ER 20 MEQ PO TBCR
40.0000 meq | EXTENDED_RELEASE_TABLET | Freq: Two times a day (BID) | ORAL | Status: AC
  Administered 2020-03-28 (×2): 40 meq via ORAL
  Filled 2020-03-28 (×2): qty 2

## 2020-03-28 MED ORDER — FUROSEMIDE 10 MG/ML IJ SOLN
20.0000 mg | Freq: Once | INTRAMUSCULAR | Status: AC
  Administered 2020-03-28: 20 mg via INTRAVENOUS
  Filled 2020-03-28: qty 2

## 2020-03-28 NOTE — Consult Note (Addendum)
Chief Complaint: Patient was seen in consultation today for left lung mass biopsy (left chest wall mass) at the request of Dr Fanny Bien   Supervising Physician: Aletta Edouard  Patient Status: [facility] - In-pt  History of Present Illness: Trevor Shelton is a 54 y.o. male   Pt with Multiple Myeloma Significant pain from same Acute kidney injury requiring dialysis-- resolved Radiation oncology complete Bacteremia; +staph 9/11--- 9/13 BC neg; ID following covid neg  Work up revealing Left chest wall mass Altered mental status-- ICH on CT Mental state improving daily  CT yesterday:  IMPRESSION: 1. No evidence of pulmonary embolism. 2. Interval worsening of mild patchy bilateral airspace process most prominent over the periphery of the right upper lung which may be due to multifocal infection. Slight worsening small bilateral pleural effusions with associated bibasilar atelectasis. 3. Interval enlargement of a pleural based mass over the anterolateral left upper thorax measuring 2.3 x 5.6 cm (previously 1.4 x 5.1 cm). Interval development of left axillary adenopathy. Findings are likely due to patient's underlying multiple myeloma. 4. Stable cardiomegaly and minimal atherosclerotic coronary artery disease. 5. Diffuse lytic process throughout the axial and appendicular bony structures compatible with patient's known multiple myeloma. Multiple associated spinal compression fractures, rib fractures, sternal fracture and right inferior scapular fracture likely pathologic due to underlying myeloma.  Request for Left chest wall mass biopsy Approved with Dr Pascal Lux plts 25 this am--- most recent transfusion: 03/27/20   Past Medical History:  Diagnosis Date  . AKI (acute kidney injury) ([place]) 02/2020  . Hypercalcemia 02/2020  . Hypercholesteremia   . Hypertension     Past Surgical History:  Procedure Laterality Date  . HERNIA REPAIR    . IR FLUORO GUIDE CV LINE RIGHT  02/17/2020    . IR REMOVAL TUN CV CATH W/O FL  03/01/2020  . IR US GUIDE VASC ACCESS RIGHT  02/17/2020    Allergies: Other and Penicillins  Medications: Prior to Admission medications   Medication Sig Start Date End Date Taking? Authorizing Provider  loratadine (CLARITIN) 10 MG tablet Take 10 mg by mouth daily.   Yes [provider]  meloxicam (MOBIC) 15 MG tablet Take 15 mg by mouth daily as needed for pain.   Yes [provider]  simvastatin (ZOCOR) 20 MG tablet Take 20 mg by mouth daily. 02/01/20  Yes [provider]  terazosin (HYTRIN) 1 MG capsule Take 1 mg by mouth at bedtime.   Yes [provider]  amLODipine (NORVASC) 10 MG tablet Take 1 tablet (10 mg total) by mouth daily. Patient not taking: Reported on 03/21/2020 03/04/20   Shelly Coss, MD  carvedilol (COREG) 3.125 MG tablet Take 1 tablet (3.125 mg total) by mouth 2 (two) times daily with a meal. Patient not taking: Reported on 03/21/2020 03/03/20   Shelly Coss, MD  dexamethasone (DECADRON) 4 MG tablet Take 1 tablet (4 mg total) by mouth 3 (three) times daily. Patient not taking: Reported on 03/21/2020 03/03/20 03/03/21  Shelly Coss, MD  feeding supplement, ENSURE ENLIVE, (ENSURE ENLIVE) LIQD Take 237 mLs by mouth 3 (three) times daily between meals. Patient not taking: Reported on 03/21/2020 03/03/20   Shelly Coss, MD  insulin aspart (NOVOLOG) 100 UNIT/ML injection Inject 0-9 Units into the skin 3 (three) times daily with meals. Patient not taking: Reported on 03/21/2020 03/03/20   Shelly Coss, MD  insulin aspart (NOVOLOG) 100 UNIT/ML injection Inject 3 Units into the skin 3 (three) times daily with meals. Patient not taking:  Reported on 03/21/2020 03/03/20   Shelly Coss, MD  insulin glargine (LANTUS) 100 UNIT/ML injection Inject 0.1 mLs (10 Units total) into the skin at bedtime. Patient not taking: Reported on 03/21/2020 03/03/20   Shelly Coss, MD  lidocaine (LIDODERM) 5 % Place 1 patch onto  the skin daily. Remove & Discard patch within 12 hours or as directed by MD Patient not taking: Reported on 03/21/2020 03/03/20   Shelly Coss, MD  lidocaine (XYLOCAINE) 2 % solution Use as directed 15 mLs in the mouth or throat every 6 (six) hours as needed for mouth pain. Patient not taking: Reported on 03/21/2020 03/03/20   Shelly Coss, MD  oxyCODONE (OXY IR/ROXICODONE) 5 MG immediate release tablet Take 1 tablet (5 mg total) by mouth every 4 (four) hours as needed for moderate pain. Patient not taking: Reported on 03/21/2020 03/03/20   Shelly Coss, MD  pantoprazole (PROTONIX) 40 MG tablet Take 1 tablet (40 mg total) by mouth daily. Patient not taking: Reported on 03/21/2020 03/04/20   Shelly Coss, MD  polyethylene glycol (MIRALAX / GLYCOLAX) 17 g packet Take 17 g by mouth daily as needed for moderate constipation. Patient not taking: Reported on 03/21/2020 03/03/20   Shelly Coss, MD     Family History  Problem Relation Age of Onset  . Alzheimer's disease Mother   . Diabetes Father   . Diabetes Brother     Social History   Socioeconomic History  . Marital status: Married    Spouse name: Not on file  . Number of children: Not on file  . Years of education: Not on file  . Highest education level: Not on file  Occupational History  . Not on file  Tobacco Use  . Smoking status: Never Smoker  . Smokeless tobacco: Never Used  Vaping Use  . Vaping Use: Never used  Substance and Sexual Activity  . Alcohol use: Not Currently  . Drug use: Not Currently  . Sexual activity: Not Currently  Other Topics Concern  . Not on file  Social History Narrative  . Not on file   Social Determinants of Health   Financial Resource Strain:   . Difficulty of Paying Living Expenses: Not on file  Food Insecurity:   . Worried About Charity fundraiser in the Last Year: Not on file  . Ran Out of Food in the Last Year: Not on file  Transportation Needs:   . Lack of Transportation  (Medical): Not on file  . Lack of Transportation (Non-Medical): Not on file  Physical Activity:   . Days of Exercise per Week: Not on file  . Minutes of Exercise per Session: Not on file  Stress:   . Feeling of Stress : Not on file  Social Connections:   . Frequency of Communication with Friends and Family: Not on file  . Frequency of Social Gatherings with Friends and Family: Not on file  . Attends Religious Services: Not on file  . Active Member of Clubs or Organizations: Not on file  . Attends Archivist Meetings: Not on file  . Marital Status: Not on file    Review of Systems: A 12 point ROS discussed and pertinent positives are indicated in the HPI above.  All other systems are negative.  Review of Systems  Constitutional: Positive for activity change, appetite change and fatigue.  Respiratory: Positive for cough, shortness of breath and wheezing.   Gastrointestinal: Negative for abdominal pain.  Musculoskeletal: Positive for back pain and gait  problem.  Neurological: Positive for weakness.  Psychiatric/Behavioral: Negative for behavioral problems and confusion.    Vital Signs: BP 129/78 (BP Location: Right Arm)   Pulse 100   Temp 98.6 F (37 C) (Oral)   Resp 16   Wt 152 lb 8 oz (69.2 kg)   SpO2 96%   BMI 27.01 kg/m   Physical Exam Cardiovascular:     Rate and Rhythm: Normal rate and regular rhythm.     Heart sounds: Normal heart sounds.  Pulmonary:     Breath sounds: Wheezing present.  Abdominal:     Palpations: Abdomen is soft.  Musculoskeletal:        General: Normal range of motion.  Skin:    General: Skin is warm.  Neurological:     Mental Status: He is alert and oriented to person, place, and time.  Psychiatric:        Behavior: Behavior normal.     Comments: Consented pt for procedure through [facility] [id number]      Imaging: EEG  Result Date: 03/22/2020 Lora Havens, MD     03/22/2020  1:52 PM Patient Name: Darby Shadwick  MRN: [id number] Epilepsy Attending: Lora Havens Referring Physician/Provider: Dr Karena Addison Aroor Date: 03/22/2020 Duration: 23.25 mins Patient history: 54yo m with AMS. CTH showed right frontal mass concerning for mets. EEG to evaluate for seizure. Level of alertness: awake AEDs during EEG study: LEV Technical aspects: This EEG study was done with scalp electrodes positioned according to the 10-20 International system of electrode placement. Electrical activity was acquired at a sampling rate of 500Hz and reviewed with a high frequency filter of 70Hz and a low frequency filter of 1Hz. EEG data were recorded continuously and digitally stored. Description: No posterior dominant rhythm was seen. EEG showed continuous generalized 3 to 6 Hz theta-delta slowing. Hyperventilation and photic stimulation were not performed.   ABNORMALITY -Continuous slow, generalized IMPRESSION: This study is suggestive of moderate diffuse encephalopathy, nonspecific etiology. No seizures or epileptiform discharges were seen throughout the recording. Priyanka Barbra Sarks   CT ABDOMEN PELVIS WO CONTRAST  Result Date: 03/24/2020 CLINICAL DATA:  Abdominal pain, abdominal distension EXAM: CT ABDOMEN AND PELVIS WITHOUT CONTRAST TECHNIQUE: Multidetector CT imaging of the abdomen and pelvis was performed following the standard protocol without IV contrast. Oral enteric contrast was administered. COMPARISON:  CT chest abdomen pelvis, 03/20/2020 FINDINGS: Lower chest: Dependent bibasilar atelectasis and/or consolidation with small bilateral pleural effusions, similar to prior examination. Hepatobiliary: No solid liver abnormality is seen. No gallstones, gallbladder wall thickening, or biliary dilatation. Pancreas: Unremarkable. No pancreatic ductal dilatation or surrounding inflammatory changes. Spleen: Normal in size without significant abnormality. Adrenals/Urinary Tract: Adrenal glands are unremarkable. Kidneys are normal, without renal calculi,  solid lesion, or hydronephrosis. Bladder is unremarkable. Stomach/Bowel: Stomach is within normal limits. The small bowel and colon are diffusely gas distended, gas and stool present to the rectum, and degree of bowel distension somewhat increased compared to prior examination. Largest loops of mid small bowel measure up to 4.4 cm in caliber. The cecum measures up to 9.4 cm. Vascular/Lymphatic: No significant vascular findings are present. No enlarged abdominal or pelvic lymph nodes. Reproductive: No mass or other significant abnormality. Other: Small fat and fluid containing bilateral inguinal hernias. Trace ascites throughout the abdomen and pelvis, new compared to prior examination. Musculoskeletal: There is redemonstrated, diffuse lytic osseous metastatic disease, most notable for a large lytic lesion and associated pathologic fracture of the T9 vertebral body. IMPRESSION: 1. The small  bowel and colon are diffusely gas distended, gas and stool present to the rectum, and degree of bowel distension somewhat increased compared to prior examination. Findings are most consistent with ileus. 2. Trace ascites throughout the abdomen and pelvis, new compared to prior examination. 3. Dependent bibasilar atelectasis and/or consolidation of the included lung bases with small bilateral pleural effusions, similar to prior examination. 4. There is redemonstrated, diffuse lytic osseous metastatic disease, most notable for a large lytic lesion and associated pathologic fracture of the T9 vertebral body. Findings are in keeping with multiple myeloma. Electronically Signed   By: Eddie Candle M.D.   On: 03/24/2020 17:21   DG Chest 2 View  Result Date: 04/05/2020 CLINICAL DATA:  Shortness of breath and fever EXAM: CHEST - 2 VIEW COMPARISON:  February 25, 2020 FINDINGS: There is mild atelectatic change in the left mid lung and bibasilar regions. There is no edema or airspace opacity. Heart is upper normal in size with pulmonary  vascularity normal. No adenopathy. No bone lesions. IMPRESSION: Areas of patchy atelectasis bilaterally. No edema or airspace opacity. Cardiac silhouette within normal limits. Electronically Signed   By: Lowella Grip III M.D.   On: 04/05/2020 15:21   DG Abd 1 View  Result Date: 03/13/2020 CLINICAL DATA:  Nausea, vomiting. EXAM: ABDOMEN - 1 VIEW COMPARISON:  March 10, 2020. FINDINGS: The bowel gas pattern is normal. Moderate amount of stool seen in right colon. No radio-opaque calculi or other significant radiographic abnormality are seen. IMPRESSION: Moderate stool burden is noted. No evidence of bowel obstruction or ileus. Electronically Signed   By: Marijo Conception M.D.   On: 03/13/2020 11:31   DG Abd 1 View  Result Date: 03/10/2020 CLINICAL DATA:  Abdominal pain. EXAM: ABDOMEN - 1 VIEW COMPARISON:  Radiograph 03/05/2020. FINDINGS: Mild gaseous distention of large and small bowel in a nonobstructive pattern. Small to moderate stool in the right colon, with diminished stool burden from prior exam. There is no evidence of free intra-abdominal air. No radiopaque calculi. No osseous abnormalities are seen. IMPRESSION: Bowel-gas pattern suggestive of ileus, with slight improvement in gaseous bowel distension over the past few days. Electronically Signed   By: Keith Rake M.D.   On: 03/10/2020 14:13   DG Abd 1 View  Result Date: 03/05/2020 CLINICAL DATA:  Abdominal pain and bloating. EXAM: ABDOMEN - 1 VIEW COMPARISON:  Abdominal radiograph 03/03/2020, CT 02/14/2020 FINDINGS: No evidence of bowel obstruction on the supine views. Mild gaseous distention of stomach with slight improvement from prior. Mild gaseous small bowel distension and a generalized pattern. Moderate colonic stool burden. No abnormal rectal distention. No radiopaque calculi. No acute osseous abnormalities are seen. IMPRESSION: Ileus bowel gas pattern with mild gaseous distension of small bowel loops and stomach with slight  improvement from recent exam. No evidence of developing obstruction. Moderate colonic stool burden. Electronically Signed   By: Keith Rake M.D.   On: 03/05/2020 15:47   DG Abd 1 View  Result Date: 03/03/2020 CLINICAL DATA:  Cord compression.  Left upper quadrant pain. EXAM: ABDOMEN - 1 VIEW COMPARISON:  CT 02/14/2020. FINDINGS: Distended loops of small and large bowel noted suggesting adynamic ileus. Recent CT of the abdomen pelvis of 02/14/2020 did reveal a right inguinal hernia with herniation of fat. No free air. Bony lesions present best identified by prior CT. IMPRESSION: Distended loops of small and large bowel noted suggesting adynamic ileus. Recent CT of the abdomen pelvis of 02/14/2020 however did reveal a right inguinal  hernia with herniation of fat. Follow-up exams suggested to demonstrate resolution of bowel distention in order to exclude bowel obstruction. Electronically Signed   By: Marcello Moores  Register   On: 03/03/2020 13:56   CT HEAD WO CONTRAST  Result Date: 03/22/2020 CLINICAL DATA:  Headache EXAM: CT HEAD WITHOUT CONTRAST TECHNIQUE: Contiguous axial images were obtained from the base of the skull through the vertex without intravenous contrast. COMPARISON:  Brain MRI 03/20/2020 FINDINGS: Brain: Trace extra-axial hemorrhage beneath the right frontal lobe remains faintly visible. No new site of hemorrhage. No midline shift or other mass effect. Vascular: No hyperdense vessel or unexpected calcification. Skull: Numerous lucent lesions throughout the calvarium and skull base. The largest lesion is at the left clivus. Sinuses/Orbits: No acute finding. Other: None. IMPRESSION: 1. Trace extra-axial hemorrhage beneath the right frontal lobe remains faintly visible. No new site of hemorrhage. 2. Numerous lucent lesions throughout the calvarium and skull base, consistent with known multiple myeloma. Electronically Signed   By: Ulyses Jarred M.D.   On: 03/22/2020 00:12   CT HEAD WO  CONTRAST  Result Date: 03/20/2020 CLINICAL DATA:  Stroke, follow-up. EXAM: CT HEAD WITHOUT CONTRAST TECHNIQUE: Contiguous axial images were obtained from the base of the skull through the vertex without intravenous contrast. COMPARISON:  Brain MRI 03/20/2020. CT head 03/20/2020 FINDINGS: Brain: The examination is mild to moderately motion degraded, limiting evaluation. A previously demonstrated foci of intracranial hemorrhage along the anteroinferior right frontal lobe convexity (suspected both intraparenchymal and extra-axial) has decreased in conspicuity (series 5, image 16, image 17). No new site of acute intracranial hemorrhage is identified. There is no significant mass effect.  No midline shift. Vascular: No hyperdense vessel Skull: Redemonstrated are innumerable marrow replacing lesions throughout the calvarium, skull base and visualized upper cervical spine compatible with the known history of multiple myeloma Sinuses/Orbits: No acute orbital abnormality identified. No significant paranasal sinus disease or mastoid effusion. IMPRESSION: Motion degraded examination. Interval decrease in size and conspicuity of a known focus of intracranial hemorrhage along the anteroinferior right frontal lobe convexity (suspected both intraparenchymal and extra-axial). No new site of acute intracranial hemorrhage is identified. Redemonstrated innumerable marrow replacing lesions throughout the calvarium, skull base and visualized upper cervical spine compatible with the known history of multiple myeloma. Electronically Signed   By: Kellie Simmering DO   On: 03/20/2020 13:39   CT HEAD WO CONTRAST  Result Date: 03/20/2020 CLINICAL DATA:  Initial evaluation for acute altered mental status. EXAM: CT HEAD WITHOUT CONTRAST TECHNIQUE: Contiguous axial images were obtained from the base of the skull through the vertex without intravenous contrast. COMPARISON:  None available. FINDINGS: Brain: Cerebral volume within normal limits  for age. There is a somewhat focal lobulated hyperdensity immediately subjacent to the right frontal calvarium (series 3, image 18) finding also seen on coronal sequence (series 5, images 21-23) and sagittal sequence (series 6, image 23). Lesion measures up to 2.1 cm in greatest dimension on sagittal sequence. While this area is often prone to artifact, finding is suspicious for a small bleed. Finding is favored to be intra-axial in nature, although a small extra-axial component may be present as well. No significant mass effect or edema. No other acute intracranial hemorrhage. No acute large vessel territory infarct. No mass lesion, midline shift, or mass effect. No hydrocephalus. Vascular: No hyperdense vessel. Skull: Scalp soft tissues demonstrate no acute finding. Multiple scattered lucent lesion seen throughout the calvarium and visualized upper cervical spine. Note made of a prominent 1.7 cm lytic  lesion within the clivus. Findings are nonspecific, but concerning for possible Mets or myeloma. Sinuses/Orbits: Globes and orbital soft tissues within normal limits. Paranasal sinuses are clear. Trace left mastoid effusion noted. Other: None. IMPRESSION: 1. Small lobulated hyperdensity immediately subjacent to the right frontal calvarium as above. Highly suspicious for a small bleed. No significant mass effect or edema. 2. Multiple scattered lucent lesions throughout the calvarium and visualized upper cervical spine, consistent with history of multiple myeloma. 3. No other acute intracranial abnormality. Critical Value/emergent results were called by telephone at the time of interpretation on 03/20/2020 at 1:12 am to provider NA LI , who verbally acknowledged these results. Electronically Signed   By: Jeannine Boga M.D.   On: 03/20/2020 01:20   CT ANGIO CHEST PE W OR WO CONTRAST  Result Date: 03/27/2020 CLINICAL DATA:  Chest pain and difficulty breathing. Possible pulmonary embolism. EXAM: CT ANGIOGRAPHY  CHEST WITH CONTRAST TECHNIQUE: Multidetector CT imaging of the chest was performed using the standard protocol during bolus administration of intravenous contrast. Multiplanar CT image reconstructions and MIPs were obtained to evaluate the vascular anatomy. CONTRAST:  24m OMNIPAQUE IOHEXOL 350 MG/ML SOLN COMPARISON:  Chest CT 03/20/2020 FINDINGS: Cardiovascular: Mild stable cardiomegaly. Minimal calcified plaque over the left anterior descending and right coronary arteries. Thoracic aorta is normal in caliber. Pulmonary arterial system is well opacified without evidence of emboli. Mediastinum/Nodes: No evidence of mediastinal or hilar adenopathy. Interval development of left axillary adenopathy. Lungs/Pleura: Lungs are adequately inflated and demonstrate slight worsening small bilateral pleural effusions with associated posterior bibasilar atelectasis. Interval worsening of mild patchy bilateral airspace opacification most prominent over the periphery of the right upper lung. Findings may be due to multifocal infection. Airways are unremarkable. Interval enlargement of pleural based mass over the anterolateral left upper thorax measuring 2.3 x 5.6 cm (previously 1.4 x 5.1 cm). Upper Abdomen: No acute findings. Musculoskeletal: Interval progression of diffuse lytic process throughout the axial and appendicular bony structures compatible with known multiple myeloma. Multiple bilateral rib fractures unchanged. Fracture of the body of the sternum just below the sternomanubrial joint unchanged. Fracture with mild associated soft tissue density/lytic process over the inferior aspect of the right scapula with interval progression. Known pathologic compression fractures T6 and T9 without significant change. All these fracture are likely pathologic secondary to patient's underlying multiple myeloma. Review of the MIP images confirms the above findings. IMPRESSION: 1. No evidence of pulmonary embolism. 2. Interval worsening  of mild patchy bilateral airspace process most prominent over the periphery of the right upper lung which may be due to multifocal infection. Slight worsening small bilateral pleural effusions with associated bibasilar atelectasis. 3. Interval enlargement of a pleural based mass over the anterolateral left upper thorax measuring 2.3 x 5.6 cm (previously 1.4 x 5.1 cm). Interval development of left axillary adenopathy. Findings are likely due to patient's underlying multiple myeloma. 4. Stable cardiomegaly and minimal atherosclerotic coronary artery disease. 5. Diffuse lytic process throughout the axial and appendicular bony structures compatible with patient's known multiple myeloma. Multiple associated spinal compression fractures, rib fractures, sternal fracture and right inferior scapular fracture likely pathologic due to underlying myeloma. Electronically Signed   By: DMarin OlpM.D.   On: 03/27/2020 16:06   MR BRAIN W WO CONTRAST  Result Date: 03/20/2020 CLINICAL DATA:  Initial evaluation for brain mass or lesion. EXAM: MRI HEAD WITHOUT AND WITH CONTRAST TECHNIQUE: Multiplanar, multiecho pulse sequences of the brain and surrounding structures were obtained without and with intravenous contrast. CONTRAST:  50m GADAVIST GADOBUTROL 1 MMOL/ML IV SOLN COMPARISON:  Prior head CT from earlier the same day. FINDINGS: Brain: Cerebral volume within normal limits for age. No significant cerebral white matter disease or other focal parenchymal signal abnormality. Abnormal T1/FLAIR hyperintensity involving the anterior inferior right frontal convexity, consistent with acute intracranial hemorrhage, corresponding with abnormality on prior CT (series 11, images 10-14). This appears to be both intraparenchymal and extra-axial, and is favored to have initially represented a small parenchymal bleed that developed subdural/extra-axial extension. Size of this hemorrhage measures approximately 1.8 cm in greatest dimension on  sagittal sequences. No associated edema or significant regional mass effect. No definite underlying lesion. Slight asymmetric smooth dural thickening and enhancement seen elsewhere about the right cerebral convexity, favored to be reactive. Consideration given to possible myelomatous involvement of the dura, although this would be expected to be more nodular and irregular in appearance. No other acute intracranial hemorrhage identified. Single punctate focus of susceptibility artifact noted at the right frontal centrum semi ovale, likely a small chronic microhemorrhage, of doubtful significance in isolation. No evidence for acute or subacute infarct. No encephalomalacia to suggest chronic cortical infarction. No appreciable intra-axial mass lesion. Ventricles normal size without hydrocephalus. No other extra-axial fluid collection. 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 Midline structures intact. No other abnormal enhancement. Vascular: Major intracranial vascular flow voids are maintained. Skull and upper cervical spine: Craniocervical junction within normal limits. Extensive and innumerable marrow replacing lesions seen throughout the calvarium, skull base, and visualized upper spine, compatible with history of multiple myeloma. Most prominent of these lesions positioned in the clivus and measures approximately 2 cm in size. No other scalp soft tissue abnormality. Sinuses/Orbits: Globes and orbital soft tissues within normal limits. Paranasal sinuses are largely clear. No significant mastoid effusion. Inner ear structures grossly normal. Other: None. IMPRESSION: 1. Acute intracranial hemorrhage involving the anterior-inferior right frontal convexity, stable from prior head CT. No associated edema or significant mass effect. 2. No other acute intracranial abnormality. 3. Innumerable marrow replacing lesions throughout the calvarium, skull base, and visualized upper spine, compatible with history of multiple myeloma.  Electronically Signed   By: BJeannine BogaM.D.   On: 03/20/2020 07:35   CT CHEST ABDOMEN PELVIS W CONTRAST  Result Date: 03/20/2020 CLINICAL DATA:  Abdominal distension. EXAM: CT CHEST, ABDOMEN, AND PELVIS WITH CONTRAST TECHNIQUE: Multidetector CT imaging of the chest, abdomen and pelvis was performed following the standard protocol during bolus administration of intravenous contrast. CONTRAST:  1064mOMNIPAQUE IOHEXOL 300 MG/ML  SOLN COMPARISON:  February 14, 2020 FINDINGS: CT CHEST FINDINGS Cardiovascular: No significant vascular findings. There is mild cardiomegaly. No pericardial effusion. Mediastinum/Nodes: No enlarged mediastinal, hilar, or axillary lymph nodes. Thyroid gland, trachea, and esophagus demonstrate no significant findings. Lungs/Pleura: Mild to moderate severity areas of atelectasis and/or infiltrate are seen within the inferior aspect of the left upper lobe posterior aspect of the superior portion of the right lower lobe and posterior aspect of the bilateral lung bases. There is a very small left pleural effusion. A 5.2 cm x 1.5 cm pleural based soft tissue mass is seen along the anterior aspect of the left upper lobe (axial CT image 22, CT series number 3). A 1.4 cm pleural based soft tissue nodule is seen within this region on the prior study. Musculoskeletal: Chronic anterior second, third and fourth left rib fractures are noted. A chronic fracture of the right scapula is also seen. Numerous lytic lesions are seen throughout the thoracic spine with a  chronic pathologic fractures noted at the levels of T6 and T9 vertebral bodies. These are seen on the prior study. CT ABDOMEN PELVIS FINDINGS Hepatobiliary: A 9 mm cystic appearing area is seen within the medial aspect of the right lobe of the liver. No gallstones, gallbladder wall thickening, or biliary dilatation. Pancreas: Unremarkable. No pancreatic ductal dilatation or surrounding inflammatory changes. Spleen: Normal in size without  focal abnormality. Adrenals/Urinary Tract: Adrenal glands are unremarkable. Kidneys are normal in size, without renal or hydronephrosis. A 1.3 cm simple cyst is seen within the anteromedial aspect of the upper left kidney. Bladder is unremarkable. Stomach/Bowel: There is a small hiatal hernia. Appendix appears normal. No evidence of bowel wall thickening, distention, or inflammatory changes. A moderate amount of stool is seen within the distal sigmoid colon and rectum. Vascular/Lymphatic: No significant vascular findings are present. No enlarged abdominal or pelvic lymph nodes. Reproductive: The prostate gland is markedly enlarged. Other: A 4.0 cm x 3.2 cm fat-containing right scrotal hernia is noted. No abdominopelvic ascites. Musculoskeletal: Numerous lytic lesions are seen throughout the lumbar spine and pelvis. IMPRESSION: 1. Mild to moderate severity bilateral areas of atelectasis and/or infiltrate. 2. 5.2 cm x 1.5 cm pleural based soft tissue mass along the anterior aspect of the left upper lobe. A 1.4 cm pleural based soft tissue nodule is seen within this region on the prior study. These findings are concerning for the presence of a primary lung malignancy. Further evaluation with a nuclear medicine PET/CT is recommended. 3. Findings consistent with multiple myeloma versus diffuse osseous metastasis, with chronic pathologic fractures at the levels of T6 and T9 vertebral bodies. 4. Markedly enlarged prostate gland. 5. 4.0 cm x 3.2 cm fat-containing right scrotal hernia. 6. Mild cardiomegaly. 7. Small hiatal hernia. 8. Aortic atherosclerosis. 9. Small simple cyst within the left kidney. Aortic Atherosclerosis (ICD10-I70.0). Electronically Signed   By: Virgina Norfolk M.D.   On: 03/20/2020 01:09   IR Removal Tun Cv Cath W/O FL  Result Date: 03/01/2020 INDICATION: Patient with history of multiple myeloma with acute kidney injury and placement of tunneled right internal jugular HD catheter on 02/17/2020;  now with improving creatinine and urine output. Request received for HD catheter removal. EXAM: REMOVAL TUNNELED CENTRAL VENOUS CATHETER MEDICATIONS: None ANESTHESIA/SEDATION: None FLUOROSCOPY TIME:  None COMPLICATIONS: None immediate. PROCEDURE: Informed written consent was obtained from the patient after a thorough discussion of the procedural risks, benefits and alternatives. All questions were addressed. Maximal Sterile Barrier Technique was utilized including caps, mask, sterile gowns, sterile gloves, sterile drape, hand hygiene and skin antiseptic. A timeout was performed prior to the initiation of the procedure. The patient's right chest and catheter was prepped and draped in a normal sterile fashion. Heparin was removed from both ports of catheter. Using gentle manual traction the cuff of the catheter was exposed and the catheter was removed in it's entirety. Pressure was held till hemostasis was obtained. A sterile dressing was applied. The patient tolerated the procedure well with no immediate complications. IMPRESSION: Successful catheter removal as described above. Read by: Rowe Robert, PA-C Electronically Signed   By: Jacqulynn Cadet M.D.   On: 03/01/2020 13:40   DG Chest Port 1 View  Result Date: 03/23/2020 CLINICAL DATA:  Difficulty breathing EXAM: PORTABLE CHEST 1 VIEW COMPARISON:  03/23/2019 FINDINGS: Cardiac shadow is mildly prominent but stable. Patchy airspace opacity is again noted bilaterally but improved when compared with the prior exam. No sizable effusion or pneumothorax is noted. No bony abnormality is seen.  IMPRESSION: Improving aeration bilaterally. Electronically Signed   By: Inez Catalina M.D.   On: 03/23/2020 07:37   DG CHEST PORT 1 VIEW  Result Date: 03/22/2020 CLINICAL DATA:  Rapid response EXAM: PORTABLE CHEST 1 VIEW COMPARISON:  03/21/2020, 03/20/2020, 02/25/2020 FINDINGS: Mildly diminished lung volumes. Mild cardiomegaly. Left greater than right airspace disease. No  pneumothorax. IMPRESSION: Patchy left greater than right airspace disease concerning for pneumonia. Findings appear slightly worsened. Electronically Signed   By: Donavan Foil M.D.   On: 03/22/2020 00:47   DG CHEST PORT 1 VIEW  Result Date: 03/21/2020 CLINICAL DATA:  Shortness of breath EXAM: PORTABLE CHEST 1 VIEW COMPARISON:  March 19, 2020 chest radiograph and chest CT March 20, 2020 FINDINGS: There is persistent atelectatic change in left mid lung and left base regions. There is also atelectatic change in the right mid lung and right base regions, new. There may be superimposed pneumonia in the left mid lung. There is a small left pleural effusion. Heart is upper normal in size with pulmonary vascularity normal. No adenopathy. Old healed rib trauma on the left, better seen by CT. IMPRESSION: Areas of atelectatic change bilaterally, overall increased bilaterally. Question superimposed pneumonia left mid lung. Small left pleural effusion. Stable cardiac silhouette. Electronically Signed   By: Lowella Grip III M.D.   On: 03/21/2020 08:09   DG Abd Portable 1V  Result Date: 04/07/2020 CLINICAL DATA:  Abdominal distention, ileus EXAM: PORTABLE ABDOMEN - 1 VIEW COMPARISON:  03/26/2020 FINDINGS: Diffuse gaseous distention of bowel, stable or worsening since prior study. No visible free air organomegaly. IMPRESSION: Stable or slight worsening diffuse gaseous distention of bowel compatible with ileus. Electronically Signed   By: Rolm Baptise M.D.   On: 03/14/2020 05:54   DG Abd Portable 1V  Result Date: 03/26/2020 CLINICAL DATA:  Fever EXAM: X-RAY ABDOMEN 1 VIEW COMPARISON:  03/13/2020 FINDINGS: The bowel gas pattern is nonspecific with gaseous distention of loops of small bowel and colon scattered throughout the abdomen. There is an above average amount of stool throughout the ascending colon. There is no definite pneumatosis or free air. No radiopaque kidney stones. IMPRESSION: 1. Nonspecific  bowel gas pattern with gaseous distention of loops of small bowel and colon scattered throughout the abdomen. 2. Above average amount of stool throughout the ascending colon. This is not significantly changed from 03/13/2020. Electronically Signed   By: Constance Holster M.D.   On: 04/01/2020 17:46   EEG adult  Result Date: 03/24/2020 Lora Havens, MD     03/24/2020 11:15 AM Patient Name: Jamaurion Slemmer MRN: [id number] Epilepsy Attending: Lora Havens Referring Physician/Provider: Dr Roland Rack Date: 03/24/2020 Duration: 22.49 mins Patient history: 54yo M with seizure like episode. EEG to evaluate for seizure. Level of alertness: Awake AEDs during EEG study: LEV Technical aspects: This EEG study was done with scalp electrodes positioned according to the 10-20 International system of electrode placement. Electrical activity was acquired at a sampling rate of 500Hz and reviewed with a high frequency filter of 70Hz and a low frequency filter of 1Hz. EEG data were recorded continuously and digitally stored. Description: No posterior dominant rhythm was seen. EEG showed continuous generalized 3 to 6 Hz theta-delta slowing. Significant eye flutter artifact was also noted. Hyperventilation and photic stimulation were not performed.   ABNORMALITY -Continuous slow, generalized IMPRESSION: This study is suggestive of moderate diffuse encephalopathy, nonspecific etiology. No seizures or epileptiform discharges were seen throughout the recording. Priyanka Barbra Sarks   ECHOCARDIOGRAM COMPLETE  Result Date: 03/21/2020    ECHOCARDIOGRAM REPORT   Patient Name:   JAKYE MULLENS Date of Exam: 03/21/2020 Medical Rec #:  [id number]   Height:       63.0 in Accession #:    [id number]  Weight:       126.5 lb Date of Birth:  1966-01-10    BSA:          1.592 m Patient Age:    41 years    BP:           133/99 mmHg Patient Gender: M           HR:           112 bpm. Exam Location:  Inpatient Procedure: 2D Echo, Color Doppler and  Cardiac Doppler Indications:    Bacteremia  History:        Patient has no prior history of Echocardiogram examinations.                 Risk Factors:Hypertension, Diabetes and Dyslipidemia.  Sonographer:    Raquel Sarna Senior RDCS Referring Phys: [id number] [facility]  Sonographer Comments: Poor apical window due to restricted mobility. IMPRESSIONS  1. Left ventricular ejection fraction, by estimation, is 70 to 75%. The left ventricle has hyperdynamic function. The left ventricle has no regional wall motion abnormalities. There is mild left ventricular hypertrophy. Left ventricular diastolic parameters are consistent with Grade I diastolic dysfunction (impaired relaxation).  2. Right ventricular systolic function is normal. The right ventricular size is normal.  3. Left atrial size was moderately dilated.  4. The mitral valve is abnormal. Trivial mitral valve regurgitation.  5. The aortic valve is tricuspid. Aortic valve regurgitation is not visualized.  6. The inferior vena cava is normal in size with greater than 50% respiratory variability, suggesting right atrial pressure of 3 mmHg. Conclusion(s)/Recommendation(s): No evidence of valvular vegetations on this transthoracic echocardiogram. Would recommend a transesophageal echocardiogram to exclude infective endocarditis if clinically indicated. FINDINGS  Left Ventricle: Left ventricular ejection fraction, by estimation, is 70 to 75%. The left ventricle has hyperdynamic function. The left ventricle has no regional wall motion abnormalities. The left ventricular internal cavity size was normal in size. There is mild left ventricular hypertrophy. Left ventricular diastolic parameters are consistent with Grade I diastolic dysfunction (impaired relaxation). Indeterminate filling pressures. Right Ventricle: The right ventricular size is normal. No increase in right ventricular wall thickness. Right ventricular systolic function is normal. Left Atrium: Left atrial size was  moderately dilated. Right Atrium: Right atrial size was normal in size. Pericardium: There is no evidence of pericardial effusion. Mitral Valve: The mitral valve is abnormal. There is mild thickening of the mitral valve leaflet(s). Trivial mitral valve regurgitation. Tricuspid Valve: The tricuspid valve is grossly normal. Tricuspid valve regurgitation is not demonstrated. Aortic Valve: The aortic valve is tricuspid. Aortic valve regurgitation is not visualized. Pulmonic Valve: The pulmonic valve was grossly normal. Pulmonic valve regurgitation is not visualized. Aorta: The aortic root and ascending aorta are structurally normal, with no evidence of dilitation. Venous: The inferior vena cava is normal in size with greater than 50% respiratory variability, suggesting right atrial pressure of 3 mmHg. IAS/Shunts: No atrial level shunt detected by color flow Doppler.  LEFT VENTRICLE PLAX 2D LVIDd:         4.30 cm LVIDs:         2.40 cm LV PW:         1.10 cm LV IVS:  0.90 cm LVOT diam:     2.00 cm LV SV:         78 LV SV Index:   49 LVOT Area:     3.14 cm  RIGHT VENTRICLE RV S prime:     19.80 cm/s TAPSE (M-mode): 2.4 cm LEFT ATRIUM             Index       RIGHT ATRIUM           Index LA diam:        3.70 cm 2.32 cm/m  RA Area:     19.10 cm LA Vol (A2C):   46.0 ml 28.90 ml/m RA Volume:   53.30 ml  33.48 ml/m LA Vol (A4C):   82.5 ml 51.83 ml/m LA Biplane Vol: 63.7 ml 40.02 ml/m  AORTIC VALVE LVOT Vmax:   126.00 cm/s LVOT Vmean:  96.400 cm/s LVOT VTI:    0.248 m  AORTA Ao Root diam: 3.20 cm Ao Asc diam:  2.80 cm  SHUNTS Systemic VTI:  0.25 m Systemic Diam: 2.00 cm Lyman Bishop MD Electronically signed by Lyman Bishop MD Signature Date/Time: 03/21/2020/12:51:13 PM    Final    VAS [place] LOWER EXTREMITY VENOUS (DVT)  Result Date: 03/03/2020  Lower Venous DVTStudy Indications: Immobility.  Performing Technologist: Griffin Basil RCT RDMS  Examination Guidelines: A complete evaluation includes B-mode imaging,  spectral Doppler, color Doppler, and power Doppler as needed of all accessible portions of each vessel. Bilateral testing is considered an integral part of a complete examination. Limited examinations for reoccurring indications may be performed as noted. The reflux portion of the exam is performed with the patient in reverse Trendelenburg.  +---------+---------------+---------+-----------+----------+--------------+ RIGHT    CompressibilityPhasicitySpontaneityPropertiesThrombus Aging +---------+---------------+---------+-----------+----------+--------------+ CFV      Full           Yes      Yes                                 +---------+---------------+---------+-----------+----------+--------------+ SFJ      Full                                                        +---------+---------------+---------+-----------+----------+--------------+ FV Prox  Full                                                        +---------+---------------+---------+-----------+----------+--------------+ FV Mid   Full                                                        +---------+---------------+---------+-----------+----------+--------------+ FV DistalFull                                                        +---------+---------------+---------+-----------+----------+--------------+ PFV  Full                                                        +---------+---------------+---------+-----------+----------+--------------+ POP      Full           Yes      Yes                                 +---------+---------------+---------+-----------+----------+--------------+ PTV      Full                                                        +---------+---------------+---------+-----------+----------+--------------+ PERO     Full                                                        +---------+---------------+---------+-----------+----------+--------------+    +---------+---------------+---------+-----------+----------+--------------+ LEFT     CompressibilityPhasicitySpontaneityPropertiesThrombus Aging +---------+---------------+---------+-----------+----------+--------------+ CFV      Full           Yes      Yes                                 +---------+---------------+---------+-----------+----------+--------------+ SFJ      Full                                                        +---------+---------------+---------+-----------+----------+--------------+ FV Prox  Full                                                        +---------+---------------+---------+-----------+----------+--------------+ FV Mid   Full                                                        +---------+---------------+---------+-----------+----------+--------------+ FV DistalFull                                                        +---------+---------------+---------+-----------+----------+--------------+ PFV      Full                                                        +---------+---------------+---------+-----------+----------+--------------+  POP      Full           Yes      Yes                                 +---------+---------------+---------+-----------+----------+--------------+ PTV      Full                                                        +---------+---------------+---------+-----------+----------+--------------+ PERO     Full                                                        +---------+---------------+---------+-----------+----------+--------------+     Summary: RIGHT: - There is no evidence of deep vein thrombosis in the lower extremity.  - No cystic structure found in the popliteal fossa.  LEFT: - There is no evidence of deep vein thrombosis in the lower extremity.  - No cystic structure found in the popliteal fossa.  *See table(s) above for measurements and observations. Electronically signed  by Monica Martinez MD on 03/03/2020 at 3:26:05 PM.    Final     Labs:  CBC: Recent Labs    03/24/20 1018 03/24/20 1018 03/09/2020 0704 03/26/20 0254 03/27/20 1537 03/28/20 0355  WBC 5.1  --  4.9 4.0 4.3  --   HGB 8.9*   < > 8.1* 7.1* 6.2* 7.7*  HCT 25.7*   < > 23.8* 21.4* 18.1* 22.7*  PLT 50*  --  40* 35* 25*  --    < > = values in this interval not displayed.    COAGS: Recent Labs    02/15/20 1150 02/18/20 1132 03/20/20 0201 03/20/20 1134  INR 1.1 1.1 1.4* 1.5*  APTT 20*  --  44* 48*    BMP: Recent Labs    03/11/2020 0704 03/26/20 0254 03/27/20 0139 03/28/20 0852  NA 127* 127* 124* 126*  K 3.7 3.8 3.6 3.1*  CL 100 100 96* 93*  CO2 19* 17* 21* 25  GLUCOSE 205* 192* 138* 146*  BUN 41* 35* 21* 16  CALCIUM 8.8* 9.1 8.8* 8.7*  CREATININE 1.23 0.88 0.63 0.62  GFRNONAA >60 >60 >60 >60  GFRAA >60 >60 >60 >60    LIVER FUNCTION TESTS: Recent Labs    03/24/20 1018 04/05/2020 0704 03/26/20 0254 03/27/20 0139  BILITOT 0.4 0.5 0.4 0.6  AST _0 ALT _1 ALKPHOS 78 79 70 73  PROT 5.7* 5.6* 5.2* 5.2*  ALBUMIN 1.6* 1.6* 1.5* 1.5*    TUMOR MARKERS: No results for input(s): AFPTM, CEA, CA199, CHROMGRNA in the last 8760 hours.  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  All of the patient's questions were answered and the patient is agreeable to proceed. Consent signed and in chart.  Thank you for this interesting consult.  I greatly enjoyed meeting Trevor Shelton and look forward to participating in their  care.  A copy of this report was sent to the requesting provider on this date.  Electronically Signed: Lavonia Drafts, PA-C 03/28/2020, 10:30 AM   I spent a total of 40 Minutes    in face to face in clinical consultation, greater than 50% of which was counseling/coordinating care for LUL mass biopsy

## 2020-03-28 NOTE — NC FL2 (Signed)
[place] LEVEL OF CARE SCREENING TOOL     IDENTIFICATION  Patient Name: Trevor Shelton Birthdate: Dec 31, 1965 Sex: male Admission Date (Current Location): 03/22/2020  [facility] and [place] Number:  Herbalist and Address:  [facility] [place], [place] [address], [place], [place] [address]      Provider Number: [id number]  Attending Physician Name and Address:  Domenic Polite, MD  Relative Name and Phone Number:  Verdis Frederickson [phone]    Current Level of Care: Hospital Recommended Level of Care: [place] Prior Approval Number:    Date Approved/Denied:   PASRR Number: [id number]  Discharge Plan: SNF    Current Diagnoses: Patient Active Problem List   Diagnosis Date Noted  . Delirium 03/24/2020  . Pneumonia 03/21/2020  . Sepsis ([place]) 03/21/2020  . AMS (altered mental status) 03/20/2020  . Intracerebral hemorrhage 03/20/2020  . MSSA bacteremia   . Acute blood loss anemia   . Pancytopenia ([place])   . 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 Pressure injury of skin 03/17/2020  . Cord compression ([place]) 03/03/2020  . Incomplete paraplegia ([place]) 03/03/2020  . Elevated liver enzymes 02/26/2020  . Diabetes mellitus ([place]) 02/26/2020  . Hypertension   . Constipation   . Multiple myeloma not having achieved remission ([place]) 02/18/2020  . Hypercalcemia 02/14/2020  . AKI (acute kidney injury) ([place]) 02/14/2020  . Pathologic fracture 02/14/2020  . Hyponatremia 02/14/2020    Orientation RESPIRATION BLADDER Height & Weight     Self, Time, Situation, Place  Normal Incontinent, External catheter (External Urinary Catheter) Weight: 152 lb 8 oz (69.2 kg) Height:     BEHAVIORAL SYMPTOMS/MOOD NEUROLOGICAL BOWEL NUTRITION STATUS      Continent Diet (See Discharge Summary)  AMBULATORY STATUS COMMUNICATION OF NEEDS Skin   Extensive Assist Verbally Other (Comment) (Ecchymosis abdomen bilateral,PI Coccyx mid;R;Stage 2,Hydrocollid changed  daily,PI Buttocks Left;Medial;Stage 2 hydrocolloid changed daily cleansed)                       Personal Care Assistance Level of Assistance  Bathing, Feeding, Dressing Bathing Assistance: Maximum assistance   Dressing Assistance: Maximum assistance     Functional Limitations Info  Sight, Hearing, Speech Sight Info: Adequate Hearing Info: Adequate Speech Info: Adequate    SPECIAL CARE FACTORS FREQUENCY  PT (By licensed PT), OT (By licensed OT)     PT Frequency: 5x min weekly OT Frequency: 5x min weekly            Contractures Contractures Info: Not present    Additional Factors Info  Code Status, Allergies, Insulin Sliding Scale, Psychotropic Code Status Info: DNR Allergies Info: pollen,Penicillins Psychotropic Info: QUEtiapine (SEROQUEL) tablet 12.5 mg daily at bedtime Insulin Sliding Scale Info: insulin aspart (novoLOG) injection 0-9 Units 3 times daily with meals,insulin aspart (novoLOG) injection 3 Units 3 times daily with meals,insulin glargine (LANTUS) injection 15 Units daily at bedtime       Current Medications (03/28/2020):  This is the current hospital active medication list Current Facility-Administered Medications  Medication Dose Route Frequency Provider Last Rate Last Admin  . (feeding supplement) PROSource Plus liquid 30 mL  30 mL Oral BID BM Li, Na, MD   30 mL at 03/28/20 1044  . 0.9 %  sodium chloride infusion (Manually program via Guardrails IV Fluids)   Intravenous Once Maryanna Shape, NP 10 mL/hr at 03/24/20 0600 Rate Verify at 03/24/20 0600  . 0.9 %  sodium chloride infusion   Intravenous  PRN Domenic Polite, MD   Stopped at 03/28/20 0000  . acetaminophen (TYLENOL) suppository 650 mg  650 mg Rectal Q6H PRN Nicoletta Dress, Na, MD      . acetaminophen (TYLENOL) tablet 650 mg  650 mg Oral Q6H PRN Nicoletta Dress, Na, MD   650 mg at 03/27/20 0431  . acyclovir (ZOVIRAX) 200 MG capsule 400 mg  400 mg Oral Daily Li, Na, MD   400 mg at 03/28/20 1045  . albuterol (PROVENTIL)  (2.5 MG/3ML) 0.083% nebulizer solution 2.5 mg  2.5 mg Nebulization TID PRN Kc, Maren [place], MD      . alum & mag hydroxide-simeth (MAALOX/MYLANTA) 200-200-20 MG/5ML suspension 30 mL  30 mL Oral Q4H PRN Nicoletta Dress, Na, MD      . bisacodyl (DULCOLAX) suppository 10 mg  10 mg Rectal Daily PRN Nicoletta Dress, Na, MD      . carvedilol (COREG) tablet 3.125 mg  3.125 mg Oral BID WC Li, Na, MD   3.125 mg at 03/28/20 1404  . ceFAZolin (ANCEF) IVPB 2g/100 mL premix  2 g Intravenous Q8H Einar Grad, RPH 200 mL/hr at 03/28/20 0831 2 g at 03/28/20 0831  . dexamethasone (DECADRON) injection 2 mg  2 mg Intravenous Janeece Agee R, NP   2 mg at 03/27/20 1238  . diphenhydrAMINE (BENADRYL) 12.5 MG/5ML elixir 12.5-25 mg  12.5-25 mg Oral Q6H PRN Nicoletta Dress, Na, MD      . guaiFENesin-dextromethorphan (ROBITUSSIN DM) 100-10 MG/5ML syrup 5-10 mL  5-10 mL Oral Q6H PRN Li, Na, MD      . insulin aspart (novoLOG) injection 0-9 Units  0-9 Units Subcutaneous TID WC Nicoletta Dress, Na, MD   2 Units at 03/28/20 1221  . insulin aspart (novoLOG) injection 3 Units  3 Units Subcutaneous TID WC Nicoletta Dress, Na, MD   3 Units at 03/28/20 1223  . insulin glargine (LANTUS) injection 15 Units  15 Units Subcutaneous QHS Antonieta Pert, MD   15 Units at 03/27/20 2136  . lactulose (CHRONULAC) 10 GM/15ML solution 20 g  20 g Oral TID Domenic Polite, MD   20 g at 03/27/20 2059  . levETIRAcetam (KEPPRA) tablet 500 mg  500 mg Oral BID Garvin Fila, MD   500 mg at 03/28/20 1140  . lidocaine (LIDODERM) 5 % 2 patch  2 patch Transdermal Q24H Nicoletta Dress, Na, MD   2 patch at 03/28/20 1141  . lidocaine (XYLOCAINE) 2 % jelly   Topical PRN Li, Na, MD      . lidocaine (XYLOCAINE) 2 % viscous mouth solution 15 mL  15 mL Mouth/Throat Q6H PRN Nicoletta Dress, Na, MD      . magic mouthwash w/lidocaine  5 mL Oral QID PRN Nicoletta Dress, Na, MD      . melatonin tablet 3 mg  3 mg Oral QHS Kc, Ramesh, MD   3 mg at 03/27/20 2058  . midodrine (PROAMATINE) tablet 5 mg  5 mg Oral BID WC Domenic Polite, MD   5 mg at 03/28/20 1403  .  morphine 2 MG/ML injection 2 mg  2 mg Intravenous Q4H PRN Shela Leff, MD   2 mg at 03/28/20 0258  . ondansetron (ZOFRAN) tablet 4 mg  4 mg Oral Q6H PRN Nicoletta Dress, Na, MD       Or  . ondansetron (ZOFRAN) injection 4 mg  4 mg Intravenous Q6H PRN Nicoletta Dress, Na, MD      . ondansetron (ZOFRAN) tablet 4 mg  4 mg Oral Q6H PRN Charlann Lange, MD      .  oxymetazoline (AFRIN) 0.05 % nasal spray 1 spray  1 spray Each Nare BID PRN Antonieta Pert, MD   1 spray at 03/26/20 1851  . pantoprazole (PROTONIX) EC tablet 40 mg  40 mg Oral BID AC Li, Na, MD   40 mg at 03/28/20 1140  . polyethylene glycol (MIRALAX / GLYCOLAX) packet 17 g  17 g Oral Daily PRN Li, Na, MD      . potassium chloride SA (KLOR-CON) CR tablet 40 mEq  40 mEq Oral BID Domenic Polite, MD   40 mEq at 03/28/20 1045  . prochlorperazine (COMPAZINE) tablet 5-10 mg  5-10 mg Oral Q6H PRN Nicoletta Dress, Na, MD       Or  . prochlorperazine (COMPAZINE) suppository 12.5 mg  12.5 mg Rectal Q6H PRN Nicoletta Dress, Na, MD      . protein supplement (ENSURE MAX) liquid  11 oz Oral BID BM Domenic Polite, MD   11 oz at 03/26/20 1600  . QUEtiapine (SEROQUEL) tablet 12.5 mg  12.5 mg Oral QHS Garvin Fila, MD   12.5 mg at 03/27/20 2059  . senna-docusate (Senokot-S) tablet 2 tablet  2 tablet Oral BID Charlann Lange, MD   2 tablet at 03/27/20 1238  . simethicone (MYLICON) chewable tablet 80 mg  80 mg Oral QID PRN Nicoletta Dress, Na, MD   80 mg at 03/23/20 1151  . sodium bicarbonate/sodium chloride mouthwash 1083m   Mouth Rinse PRN LNicoletta Dress Na, MD      . sodium chloride (OCEAN) 0.65 % nasal spray 2 spray  2 spray Each Nare Q1H while awake LNicoletta Dress Na, MD   2 spray at 03/27/20 2102  . sorbitol 70 % solution 30 mL  30 mL Oral Daily PRN LNicoletta Dress Na, MD      . sucralfate (CARAFATE) 1 GM/10ML suspension 1 g  1 g Oral TID WC & HS Li, Na, MD   1 g at 03/28/20 1404  . white petrolatum (VASELINE) gel   Topical PRN LCharlann Lange MD   0.2 application at [id number]/912124     Discharge Medications: Please see discharge summary for a list of discharge  medications.  Relevant Imaging Results:  Relevant Lab Results:   Additional Information [id number] ATrula Ore LCSWA

## 2020-03-28 NOTE — Progress Notes (Addendum)
Started 1 out of 3 unit of platelets. Will continue to monitor patient closely.   0031-2nd Platelet unit started  0322-3 out 3 platelet unit started

## 2020-03-28 NOTE — Progress Notes (Signed)
   03/28/20 2111  Assess: MEWS Score  Temp (!) 100.6 F (38.1 C)  BP 131/74  Pulse Rate (!) 115  ECG Heart Rate (!) 114  Resp (!) 22  SpO2 96 %  O2 Device Room Air  Assess: MEWS Score  MEWS Temp 1  MEWS Systolic 0  MEWS Pulse 2  MEWS RR 1  MEWS LOC 0  MEWS Score 4  MEWS Score Color Red  Assess: if the MEWS score is Yellow or Red  Were vital signs taken at a resting state? Yes  Focused Assessment Change from prior assessment (see assessment flowsheet)  Early Detection of Sepsis Score *See Row Information* High  MEWS guidelines implemented *See Row Information* Yes  Take Vital Signs  Increase Vital Sign Frequency  Red: Q 1hr X 4 then Q 4hr X 4, if remains red, continue Q 4hrs  Escalate  MEWS: Escalate Red: discuss with charge nurse/RN and provider, consider discussing with RRT  Notify: Charge Nurse/RN  Name of Charge Nurse/RN Notified Jamal Collin RN   Date Charge Nurse/RN Notified 03/28/20  Time Charge Nurse/RN Notified 2130  Notify: Provider  Provider Name/Title [place] MD  Date Provider Notified 04/04/20  Time Provider Notified 2145  Notification Type Page  Notification Reason Change in status  Response Other (Comment) (still wating for call back )

## 2020-03-28 NOTE — Progress Notes (Signed)
Spoke with Dr. Marlowe Sax about patients respiratory rate and fine crackles heard during assessment.  she placed orders for STAT BNP, Troponin and Chest XRAY. Got orders to continue platelet infusion.  Will continue to monitor patient closely.

## 2020-03-28 NOTE — Progress Notes (Signed)
PROGRESS NOTE    Trevor Shelton  IHK:[id number] DOB: [birth year] DOA: 03/30/2020 PCP: Patient, No Pcp Per   Brief Narrative: 54/M with very long complex hospitalization was admitted to [facility] long hospital on 02/14/20 with back pain was found to have diffuse lytic lesions throughout the skeleton, epidural tumor extension with pathological fracture of T6 and T7, contrast-enhancing mass replacing most of T5 vertebral body with severe cord compression, and acute renal failure, required couple of hemodialysis sessions, kidney function improved. -Followed by medical and radiation oncology, treated with IV Decadron and has completed 10/10 doses of XRT.  Work-up consistent with newly diagnosed IgG kappa MM with plans for CyBorD chemo to start following completion of XRT.   -Following transfer to rehab, he underwent chemotherapy on 03/15/2020 and next chemotherapy was scheduled for 03/22/2020.  Patient had significant nosebleed on 03/17/2020.  -Subsequently spiked fevers with T-max 102 -blood culture grew MSSA bacteremia, started on IV Ancef seen by ID as well for this -During this hospitalization on a CT he was also noted to have a 5 x 2 cm left upper lobe mass of unclear etiology in addition to other findings of diffuse metastatic myeloma -9/13: Ongoing epistaxis platelet count down to 25K transfused 1 unit of platelets -9/13 night lethargic less responsive, seen by neurology, suspicion for seizure-like activity, started on Keppra, EEG negative, MRI noted acute intracranial hemorrhage in the right frontal convexity, followed by neurology for this Mental status slowly improving -Now with ileus, diffuse pain   Assessment & Plan:  Acute intracranial hemorrhage  -involving the anterior-inferior right frontal convexity likely due to severe thrombocytopenia from effect of multiple myeloma and chemotherapy.  -Repeat CT head decreased size of hemorrhage -Appreciate neurology input, recommended baby aspirin when  platelet count is higher and cleared by oncology -transfuse plst if <10K or active bleeding  Severe Sepsis  -MSSA bacteremia:  -Hospital-acquired MSSA bacteremia, clinically improved quickly after antibiotics seen by infectious disease for same  -Repeat blood cultures are negative  -2D echocardiogram was unremarkable, no central lines at that time  -TEE deferred especially in the setting of severe thrombocytopenia and complicated hospitalization -ID recommended to continue Ancef through 9/27  Metastatic multiple myeloma Epidural tumor extension at T6 and T9-> compressive myelopathy Hypercalcemia: -treated with radiation to thoracic spine due to concern for impending cord compression - MRI 8.8.21: Epidural tumor extension at T6 and T9 and T6 lesion severely narrowed thecal sac and deformed the spinal cord .  -Rx w/ velcade and dexamethasone on 9/7, acyclovir, diflucan. -Oncology following, further chemotherapy on hold with acute infection -has minimal strength in LLE and 2-3 in RLE -Will discuss prognosis with oncology, appears to be at high risk of ongoing complications, albumin is 1.5 -Remains on Decadron every other day and prophylactic acyclovir  Pancytopenia/ Anemia/severe Thrombcytopenia -secondary to metastatic myeloma and recent chemo  -S/p 1 unit platelet transfusion 9/13.  -Also transfused 3 units of PRBC this admission -Hemoglobin 7.7 this morning, will transfuse 3 units of platelets tonight for lung biopsy tomorrow, CBC in a.m.  Left upper lobe with 5.2/1.5 cm pleural based mass- concern for primary lung malignancy versus plasmacytoma per CT. followed by oncology  - Interventional radiology plans CT-guided biopsy tomorrow -Transfuse 3 units of platelets tonight -CBC this p.m. and in a.m.  Acute encephalopathy multifatorial-likely multifactorial in the setting of sepsis, stroke.mri brain w/ small bleed, and also w/ MSSA bacteremia/sepsis, hyponatremia.   -There is  question of seizure so was transferred to neuro ICU 9/14 and  is on Keppra.EEG showing moderate encephalopathy no epileptiform discharges.  On 9/15, had a staring episode seen by neurology and EEG stat no acute finding.  Has improved since then.  Epistaxis-resolved.   -Continue Afrin-prn  Chest pain/diffuse body pain likely from likely from myeloma.  Serial troponin negative continue pain control.    Hyponatremia: Suspect multifactorial - TSH cortisol okay.   -Worsening sodium noted, suspect this is secondary to third spacing -Lasix x1 again today  AKI/Short term HD in august 2021:  -Renal function improved and stable  T2DM-newly diagnosed in august 2021. a1c 6.5.  -CBGs fairly stable now continue Lantus and sliding scale insulin  Chronic constipation Ileus -Having BMs now, 2 days ago -Continue laxatives  HTN/HLD-  -bp meds amlodipine on hold, on coreg low dose and is on Midodrine-  -BP more stable now -Wean off midodrine if tolerated  Severe hypoalbuminemia -Could be secondary to underlying malignancy and complicated hospitalization  Weakness/debility/physical deconditioning: Continue PT OT, suspect multifactorial in the setting of multiple illness new cancer chemotherapy.  Return to inpatient rehab eventually.  DVT prophylaxis: SCDs Start: 03/20/20 0020 Code Status:   Code Status: DNR , after discussion w/ pt and wife 9/19 Spanish interpreter used Family Communication: Plan of care discussed with patient and wife at bedside with iPad Spanish interpreter.  9/19  Status is: Inpatient Remains inpatient appropriate because:Ongoing diagnostic testing needed not appropriate for outpatient work up and Remains hospitalized due to ongoing management of MSSA sepsis, pancytopenia  Dispo: The patient is from: CIR              Anticipated d/c is to: Possibly SNF              Anticipated d/c date is: > 3 days              Patient currently is not medically stable to  d/c. Nutrition: Diet Order            Diet NPO time specified Except for: Sips with Meds  Diet effective midnight           DIET SOFT Room service appropriate? Yes; Fluid consistency: Thin  Diet effective now                Body mass index is 27.01 kg/m.  Consultants:see note  Procedures: see note.  CT chest abd pelvis w/ contrast: Mild to moderate severity bilateral areas of atelectasis and/or infiltrate. 2. 5.2 cm x 1.5 cm pleural based soft tissue mass along the anterior aspect of the left upper lobe. A 1.4 cm pleural based soft tissue nodule is seen within this region on the prior study. These findings are concerning for the presence of a primary lung malignancy. Further evaluation with a nuclear medicine PET/CT is recommended. 3. Findings consistent with multiple myeloma versus diffuse osseous metastasis, with chronic pathologic fractures at the levels of T6 and T9 vertebral bodies. 4. Markedly enlarged prostate gland. 5. 4.0 cm x 3.2 cm fat-containing right scrotal hernia. 6. Mild cardiomegaly. 7. Small hiatal hernia. 8. Aortic atherosclerosis. 9. Small simple cyst within the left kidney.  Microbiology:see note Blood Culture    Component Value Date/Time   SDES BLOOD RIGHT HAND 03/21/2020 1359   SDES BLOOD LEFT HAND 03/21/2020 1359   SPECREQUEST  03/21/2020 1359    BOTTLES DRAWN AEROBIC AND ANAEROBIC Blood Culture results may not be optimal due to an excessive volume of blood received in culture bottles   SPECREQUEST  03/21/2020 1359  BOTTLES DRAWN AEROBIC AND ANAEROBIC Blood Culture results may not be optimal due to an excessive volume of blood received in culture bottles   CULT  03/21/2020 1359    NO GROWTH 5 DAYS Performed at [facility], [place] [address], [place], [place] [address]    CULT  03/21/2020 1359    NO GROWTH 5 DAYS Performed at [facility], [place] [address], [place], [place] [address]    REPTSTATUS 03/26/2020 FINAL 03/21/2020 1359    REPTSTATUS 03/26/2020 FINAL 03/21/2020 1359    Other culture-see note  Medications: Scheduled Meds: . (feeding supplement) PROSource Plus  30 mL Oral BID BM  . sodium chloride   Intravenous Once  . acyclovir  400 mg Oral Daily  . carvedilol  3.125 mg Oral BID WC  . dexamethasone (DECADRON) injection  2 mg Intravenous QODAY  . insulin aspart  0-9 Units Subcutaneous TID WC  . insulin aspart  3 Units Subcutaneous TID WC  . insulin glargine  15 Units Subcutaneous QHS  . lactulose  20 g Oral TID  . levETIRAcetam  500 mg Oral BID  . lidocaine  2 patch Transdermal Q24H  . melatonin  3 mg Oral QHS  . midodrine  5 mg Oral BID WC  . pantoprazole  40 mg Oral BID AC  . potassium chloride  40 mEq Oral BID  . Ensure Max Protein  11 oz Oral BID BM  . QUEtiapine  12.5 mg Oral QHS  . senna-docusate  2 tablet Oral BID  . sodium chloride  2 spray Each Nare Q1H while awake  . sucralfate  1 g Oral TID WC & HS   Continuous Infusions: . sodium chloride Stopped (03/28/20 0000)  .  ceFAZolin (ANCEF) IV 2 g (03/28/20 0831)    Antimicrobials: Anti-infectives (From admission, onward)   Start     Dose/Rate Route Frequency Ordered Stop   03/20/20 1600  ceFAZolin (ANCEF) IVPB 2g/100 mL premix        2 g 200 mL/hr over 30 Minutes Intravenous Every 8 hours 03/20/20 1505     03/20/20 1000  acyclovir (ZOVIRAX) 200 MG capsule 400 mg        400 mg Oral Daily 03/16/2020 2217     03/20/20 1000  fluconazole (DIFLUCAN) tablet 100 mg        100 mg Oral Daily 04/01/2020 2217 03/23/20 0959   03/20/20 0800  vancomycin (VANCOREADY) IVPB 750 mg/150 mL  Status:  Discontinued        750 mg 150 mL/hr over 60 Minutes Intravenous Every 12 hours 03/17/2020 2217 03/20/20 1452   03/20/20 0800  ceFEPIme (MAXIPIME) 2 g in sodium chloride 0.9 % 100 mL IVPB  Status:  Discontinued        2 g 200 mL/hr over 30 Minutes Intravenous Every 8 hours 03/20/20 0001 03/20/20 1452   03/20/20 0015  ceFEPIme (MAXIPIME) 2 g in sodium chloride  0.9 % 100 mL IVPB        2 g 200 mL/hr over 30 Minutes Intravenous  Once 03/20/20 0001 03/20/20 0159   03/22/2020 2230  vancomycin (VANCOREADY) IVPB 1250 mg/250 mL  Status:  Discontinued        1,250 mg 166.7 mL/hr over 90 Minutes Intravenous  Once 03/20/2020 2217 03/12/2020 2225   04/07/2020 2224  piperacillin-tazobactam (ZOSYN) IVPB 3.375 g  Status:  Discontinued        3.375 g 12.5 mL/hr over 240 Minutes Intravenous Every 8 hours 03/27/2020 2217  03/20/20 0000     Objective: Vitals: Today's Vitals   03/27/20 2357 03/28/20 0250 03/28/20 0754 03/28/20 1200  BP: 130/75  129/78 114/74  Pulse: (!) 101  100 (!) 114  Resp: (!) '24  16 18  ' Temp: 98.8 F (37.1 C)  98.6 F (37 C) 98.4 F (36.9 C)  TempSrc: Oral  Oral Oral  SpO2: 96%  96%   Weight:      PainSc:  5       Intake/Output Summary (Last 24 hours) at 03/28/2020 1437 Last data filed at 03/28/2020 1300 Gross per 24 hour  Intake 870.87 ml  Output 5275 ml  Net -4404.13 ml   Filed Weights   03/18/2020 0500 03/26/20 0500 03/27/20 0419  Weight: 58 kg 61 kg 69.2 kg   Weight change:   Intake/Output from previous day: 09/19 0701 - 09/20 0700 In: 1187.5 [P.O.:480; I.V.:129.5; Blood:378; IV Piggyback:200] Out: 8921 [Urine:3225] Intake/Output this shift: Total I/O In: -  Out: 3250 [Urine:3250]  Examination: General exam: Chronically ill middle-aged male appears older than stated age, uncomfortable appearing, awake alert oriented x2, no distress HEENT: Oral mucosa is moist, neck no JVD CVS: S1-S2, regular rate rhythm Lungs: Decreased breath sounds at the bases Abdomen: Firm, mildly distended, bowel sounds present but diminished Extremities: Trace edema Psych: Flat affect  Data Reviewed: I have personally reviewed following labs and imaging studies CBC: Recent Labs  Lab 03/23/20 0607 03/23/20 0607 03/24/20 1018 03/12/2020 0704 03/26/20 0254 03/27/20 1537 03/28/20 0355  WBC 3.4*  --  5.1 4.9 4.0 4.3  --   NEUTROABS 2.8  --  4.6  4.0 3.3 4.0  --   HGB 7.8*   < > 8.9* 8.1* 7.1* 6.2* 7.7*  HCT 23.2*   < > 25.7* 23.8* 21.4* 18.1* 22.7*  MCV 86.2  --  85.1 86.5 86.3 87.9  --   PLT 41*  --  50* 40* 35* 25*  --    < > = values in this interval not displayed.   Basic Metabolic Panel: Recent Labs  Lab 03/23/20 0607 03/23/20 0607 03/24/20 1018 04/03/2020 0704 03/26/20 0254 03/27/20 0139 03/28/20 0852  NA 129*   < > 130* 127* 127* 124* 126*  K 4.0   < > 3.8 3.7 3.8 3.6 3.1*  CL 102   < > 102 100 100 96* 93*  CO2 19*   < > 16* 19* 17* 21* 25  GLUCOSE 266*   < > 241* 205* 192* 138* 146*  BUN 40*   < > 38* 41* 35* 21* 16  CREATININE 0.94   < > 1.12 1.23 0.88 0.63 0.62  CALCIUM 8.2*   < > 8.6* 8.8* 9.1 8.8* 8.7*  MG 1.7  --   --   --   --   --   --   PHOS 2.7  --   --   --   --   --   --    < > = values in this interval not displayed.   GFR: Estimated Creatinine Clearance: 90.1 mL/min (by C-G formula based on SCr of 0.62 mg/dL). Liver Function Tests: Recent Labs  Lab 03/23/20 0607 03/24/20 1018 03/14/2020 0704 03/26/20 0254 03/27/20 0139  AST 45* '29 28 29 30  ' ALT '29 18 15 13 12  ' ALKPHOS 70 78 79 70 73  BILITOT 0.2* 0.4 0.5 0.4 0.6  PROT 5.7* 5.7* 5.6* 5.2* 5.2*  ALBUMIN 1.5* 1.6* 1.6* 1.5* 1.5*   No results for input(s): LIPASE,  AMYLASE in the last 168 hours. Recent Labs  Lab 03/22/20 0423  AMMONIA 45*   Coagulation Profile: No results for input(s): INR, PROTIME in the last 168 hours. Cardiac Enzymes: No results for input(s): CKTOTAL, CKMB, CKMBINDEX, TROPONINI in the last 168 hours. BNP (last 3 results) No results for input(s): PROBNP in the last 8760 hours. HbA1C: No results for input(s): HGBA1C in the last 72 hours. CBG: Recent Labs  Lab 03/27/20 1058 03/27/20 1640 03/27/20 2054 03/28/20 0752 03/28/20 1144  GLUCAP 137* 147* 213* 139* 163*   Lipid Profile: No results for input(s): CHOL, HDL, LDLCALC, TRIG, CHOLHDL, LDLDIRECT in the last 72 hours. Thyroid Function Tests: No results for  input(s): TSH, T4TOTAL, FREET4, T3FREE, THYROIDAB in the last 72 hours. Anemia Panel: No results for input(s): VITAMINB12, FOLATE, FERRITIN, TIBC, IRON, RETICCTPCT in the last 72 hours. Sepsis Labs: Recent Labs  Lab 03/22/20 0101 03/22/20 0423  LATICACIDVEN 1.4 1.2    Recent Results (from the past 240 hour(s))  Culture, Urine     Status: Abnormal   Collection Time: 03/13/2020  2:28 PM   Specimen: Urine, Random  Result Value Ref Range Status   Specimen Description URINE, RANDOM  Final   Special Requests NONE  Final   Culture (A)  Final    <10,000 COLONIES/mL INSIGNIFICANT GROWTH Performed at [facility], [place] [address], [place], [place] [address]    Report Status 03/20/2020 FINAL  Final  Culture, blood (routine x 2)     Status: Abnormal   Collection Time: 03/27/2020  6:02 PM   Specimen: BLOOD  Result Value Ref Range Status   Specimen Description BLOOD RIGHT ANTECUBITAL  Final   Special Requests   Final    BOTTLES DRAWN AEROBIC AND ANAEROBIC Blood Culture adequate volume   Culture  Setup Time   Final    GRAM POSITIVE COCCI IN CLUSTERS IN BOTH AEROBIC AND ANAEROBIC BOTTLES Organism ID to follow CRITICAL RESULT CALLED TO, READ BACK BY AND VERIFIED WITH: Barth Kirks PHARMD, AT [place] 03/20/20 BY Rush Landmark Performed at [facility], [place] [address], [place], [place] [address]    Culture STAPHYLOCOCCUS AUREUS (A)  Final   Report Status 03/22/2020 FINAL  Final   Organism ID, Bacteria STAPHYLOCOCCUS AUREUS  Final      Susceptibility   Staphylococcus aureus - MIC*    CIPROFLOXACIN <=0.5 SENSITIVE Sensitive     ERYTHROMYCIN <=0.25 SENSITIVE Sensitive     GENTAMICIN <=0.5 SENSITIVE Sensitive     OXACILLIN 0.5 SENSITIVE Sensitive     TETRACYCLINE <=1 SENSITIVE Sensitive     VANCOMYCIN 1 SENSITIVE Sensitive     TRIMETH/SULFA <=10 SENSITIVE Sensitive     CLINDAMYCIN <=0.25 SENSITIVE Sensitive     RIFAMPIN <=0.5 SENSITIVE Sensitive     Inducible Clindamycin NEGATIVE Sensitive      * STAPHYLOCOCCUS AUREUS  Blood Culture ID Panel (Reflexed)     Status: Abnormal   Collection Time: 04/04/2020  6:02 PM  Result Value Ref Range Status   Enterococcus faecalis NOT DETECTED NOT DETECTED Final   Enterococcus Faecium NOT DETECTED NOT DETECTED Final   Listeria monocytogenes NOT DETECTED NOT DETECTED Final   Staphylococcus species DETECTED (A) NOT DETECTED Final    Comment: CRITICAL RESULT CALLED TO, READ BACK BY AND VERIFIED WITH: Barth Kirks PHARMD, AT 0740 03/20/20 BY D. VANHOOK    Staphylococcus aureus (BCID) DETECTED (A) NOT DETECTED Final    Comment: CRITICAL RESULT CALLED TO, READ BACK BY AND VERIFIED WITH:  M. MICHAEL PHARMD, AT 0740 03/20/20 BY D. VANHOOK    Staphylococcus epidermidis NOT DETECTED NOT DETECTED Final   Staphylococcus lugdunensis NOT DETECTED NOT DETECTED Final   Streptococcus species NOT DETECTED NOT DETECTED Final   Streptococcus agalactiae NOT DETECTED NOT DETECTED Final   Streptococcus pneumoniae NOT DETECTED NOT DETECTED Final   Streptococcus pyogenes NOT DETECTED NOT DETECTED Final   A.calcoaceticus-baumannii NOT DETECTED NOT DETECTED Final   Bacteroides fragilis NOT DETECTED NOT DETECTED Final   Enterobacterales NOT DETECTED NOT DETECTED Final   Enterobacter cloacae complex NOT DETECTED NOT DETECTED Final   Escherichia coli NOT DETECTED NOT DETECTED Final   Klebsiella aerogenes NOT DETECTED NOT DETECTED Final   Klebsiella oxytoca NOT DETECTED NOT DETECTED Final   Klebsiella pneumoniae NOT DETECTED NOT DETECTED Final   Proteus species NOT DETECTED NOT DETECTED Final   Salmonella species NOT DETECTED NOT DETECTED Final   Serratia marcescens NOT DETECTED NOT DETECTED Final   Haemophilus influenzae NOT DETECTED NOT DETECTED Final   Neisseria meningitidis NOT DETECTED NOT DETECTED Final   Pseudomonas aeruginosa NOT DETECTED NOT DETECTED Final   Stenotrophomonas maltophilia NOT DETECTED NOT DETECTED Final   Candida albicans NOT DETECTED NOT  DETECTED Final   Candida auris NOT DETECTED NOT DETECTED Final   Candida glabrata NOT DETECTED NOT DETECTED Final   Candida krusei NOT DETECTED NOT DETECTED Final   Candida parapsilosis NOT DETECTED NOT DETECTED Final   Candida tropicalis NOT DETECTED NOT DETECTED Final   Cryptococcus neoformans/gattii NOT DETECTED NOT DETECTED Final   Meth resistant mecA/C and MREJ NOT DETECTED NOT DETECTED Final    Comment: Performed at [facility], [address] [place], [place] [address]  SARS Coronavirus 2 by RT PCR (hospital order, performed in [facility] hospital lab) Nasopharyngeal Nasopharyngeal Swab     Status: None   Collection Time: 03/18/2020  8:37 PM   Specimen: Nasopharyngeal Swab  Result Value Ref Range Status   SARS Coronavirus 2 NEGATIVE NEGATIVE Final    Comment: (NOTE) SARS-CoV-2 target nucleic acids are NOT DETECTED.  The SARS-CoV-2 RNA is generally detectable in upper and lower respiratory specimens during the acute phase of infection. The lowest concentration of SARS-CoV-2 viral copies this assay can detect is 250 copies / mL. A negative result does not preclude SARS-CoV-2 infection and should not be used as the sole basis for treatment or other patient management decisions.  A negative result may occur with improper specimen collection / handling, submission of specimen other than nasopharyngeal swab, presence of viral mutation(s) within the areas targeted by this assay, and inadequate number of viral copies (<250 copies / mL). A negative result must be combined with clinical observations, patient history, and epidemiological information.  Fact Sheet for Patients:   StrictlyIdeas.no  Fact Sheet for Healthcare Providers: BankingDealers.co.za  This test is not yet approved or  cleared by the [place] FDA and has been authorized for detection and/or diagnosis of SARS-CoV-2 by FDA under an Emergency Use Authorization  (EUA).  This EUA will remain in effect (meaning this test can be used) for the duration of the COVID-19 declaration under Section 564(b)(1) of the Act, 21 U.S.C. section 360bbb-3(b)(1), unless the authorization is terminated or revoked sooner.  Performed at [facility], [place] [address] [place], [place] [address]   MRSA PCR Screening     Status: None   Collection Time: 03/20/20  4:21 AM   Specimen: Nasopharyngeal  Result Value Ref Range Status   MRSA  by PCR NEGATIVE NEGATIVE Final    Comment:        The GeneXpert MRSA Assay (FDA approved for NASAL specimens only), is one component of a comprehensive MRSA colonization surveillance program. It is not intended to diagnose MRSA infection nor to guide or monitor treatment for MRSA infections. Performed at [facility], [address], [place], [place] [address]   Respiratory Panel by PCR     Status: None   Collection Time: 03/20/20  4:21 AM   Specimen: Nasopharyngeal Swab; Respiratory  Result Value Ref Range Status   Adenovirus NOT DETECTED NOT DETECTED Final   Coronavirus 229E NOT DETECTED NOT DETECTED Final    Comment: (NOTE) The Coronavirus on the Respiratory Panel, DOES NOT test for the novel  Coronavirus (2019 nCoV)    Coronavirus HKU1 NOT DETECTED NOT DETECTED Final   Coronavirus NL63 NOT DETECTED NOT DETECTED Final   Coronavirus OC43 NOT DETECTED NOT DETECTED Final   Metapneumovirus NOT DETECTED NOT DETECTED Final   Rhinovirus / Enterovirus NOT DETECTED NOT DETECTED Final   Influenza A NOT DETECTED NOT DETECTED Final   Influenza B NOT DETECTED NOT DETECTED Final   Parainfluenza Virus 1 NOT DETECTED NOT DETECTED Final   Parainfluenza Virus 2 NOT DETECTED NOT DETECTED Final   Parainfluenza Virus 3 NOT DETECTED NOT DETECTED Final   Parainfluenza Virus 4 NOT DETECTED NOT DETECTED Final   Respiratory Syncytial Virus NOT DETECTED NOT DETECTED Final   Bordetella pertussis NOT DETECTED NOT DETECTED Final    Chlamydophila pneumoniae NOT DETECTED NOT DETECTED Final   Mycoplasma pneumoniae NOT DETECTED NOT DETECTED Final    Comment: Performed at [facility], [place]. [address], [place], [place] [address]  Culture, blood (Routine X 2) w Reflex to ID Panel     Status: None   Collection Time: 03/21/20  1:59 PM   Specimen: BLOOD RIGHT HAND  Result Value Ref Range Status   Specimen Description BLOOD RIGHT HAND  Final   Special Requests   Final    BOTTLES DRAWN AEROBIC AND ANAEROBIC Blood Culture results may not be optimal due to an excessive volume of blood received in culture bottles   Culture   Final    NO GROWTH 5 DAYS Performed at [facility], [place] [address], [place], [place] [address]    Report Status 03/26/2020 FINAL  Final  Culture, blood (Routine X 2) w Reflex to ID Panel     Status: None   Collection Time: 03/21/20  1:59 PM   Specimen: BLOOD LEFT HAND  Result Value Ref Range Status   Specimen Description BLOOD LEFT HAND  Final   Special Requests   Final    BOTTLES DRAWN AEROBIC AND ANAEROBIC Blood Culture results may not be optimal due to an excessive volume of blood received in culture bottles   Culture   Final    NO GROWTH 5 DAYS Performed at [facility], [place] [address] [place], [place] [address]    Report Status 03/26/2020 FINAL  Final     Radiology Studies: CT ANGIO CHEST PE W OR WO CONTRAST  Result Date: 03/27/2020 CLINICAL DATA:  Chest pain and difficulty breathing. Possible pulmonary embolism. EXAM: CT ANGIOGRAPHY CHEST WITH CONTRAST TECHNIQUE: Multidetector CT imaging of the chest was performed using the standard protocol during bolus administration of intravenous contrast. Multiplanar CT image reconstructions and MIPs were obtained to evaluate the vascular anatomy. CONTRAST:  74m OMNIPAQUE IOHEXOL 350 MG/ML SOLN COMPARISON:  Chest CT 03/20/2020 FINDINGS: Cardiovascular: Mild stable cardiomegaly. Minimal calcified plaque over the left anterior descending and  right coronary arteries. Thoracic aorta is normal in caliber. Pulmonary arterial system is well opacified without evidence of emboli. Mediastinum/Nodes: No evidence of mediastinal or hilar adenopathy. Interval development of left axillary adenopathy. Lungs/Pleura: Lungs are adequately inflated and demonstrate slight worsening small bilateral pleural effusions with associated posterior bibasilar atelectasis. Interval worsening of mild patchy bilateral airspace opacification most prominent over the periphery of the right upper lung. Findings may be due to multifocal infection. Airways are unremarkable. Interval enlargement of pleural based mass over the anterolateral left upper thorax measuring 2.3 x 5.6 cm (previously 1.4 x 5.1 cm). Upper Abdomen: No acute findings. Musculoskeletal: Interval progression of diffuse lytic process throughout the axial and appendicular bony structures compatible with known multiple myeloma. Multiple bilateral rib fractures unchanged. Fracture of the body of the sternum just below the sternomanubrial joint unchanged. Fracture with mild associated soft tissue density/lytic process over the inferior aspect of the right scapula with interval progression. Known pathologic compression fractures T6 and T9 without significant change. All these fracture are likely pathologic secondary to patient's underlying multiple myeloma. Review of the MIP images confirms the above findings. IMPRESSION: 1. No evidence of pulmonary embolism. 2. Interval worsening of mild patchy bilateral airspace process most prominent over the periphery of the right upper lung which may be due to multifocal infection. Slight worsening small bilateral pleural effusions with associated bibasilar atelectasis. 3. Interval enlargement of a pleural based mass over the anterolateral left upper thorax measuring 2.3 x 5.6 cm (previously 1.4 x 5.1 cm). Interval development of left axillary adenopathy. Findings are likely due to  patient's underlying multiple myeloma. 4. Stable cardiomegaly and minimal atherosclerotic coronary artery disease. 5. Diffuse lytic process throughout the axial and appendicular bony structures compatible with patient's known multiple myeloma. Multiple associated spinal compression fractures, rib fractures, sternal fracture and right inferior scapular fracture likely pathologic due to underlying myeloma. Electronically Signed   By: Marin Olp M.D.   On: 03/27/2020 16:06     LOS: 9 days   Domenic Polite, MD [facility]  03/28/2020, 2:37 PM

## 2020-03-28 NOTE — Consult Note (Signed)
[place] Nurse wound follow up Wound type: Stage 2 Pressure Injury; left buttock Stage 3 Pressure Injury; right buttock Measurement: Left buttock: 1.5cm x 0.6cm x 0.1cm  Right buttock: 2.5cm x 1.5cm x 0.3cm  Wound bed: both areas are clean;pink, moist. The right buttock is full thickness Drainage (amount, consistency, odor) minimal, no odor Periwound: intact; patient has large amounts of hair so I will change dressing to less aggressive adhesive Dressing procedure/placement/frequency: Change to silicone foam dressing change every 3 days and PRN soilage.   Reviewed records, the wounds were present on admission 04/01/2020. Updated records [place] nursing team will not need to follow for this reason.  [place], [place], [place]

## 2020-03-28 NOTE — Progress Notes (Signed)
Patient stated that he is having chest 7/10 chest pain after eating. He says that it tends to happen after he eats and that his stomach feels hard. He also states that he feels like his food/pills get stuck in his throat.Gave iv morphine and obtained EKG.  Paged [facility], awaiting for orders.

## 2020-03-28 NOTE — TOC Initial Note (Signed)
Transition of Care [facility]) - Initial/Assessment Note    Patient Details  Name: Trevor Shelton MRN: [id number] Date of Birth: [birth year]  [facility] CM/SW Contact:    Trula Ore, [place] Phone Number: 03/28/2020, 2:01 PM  Clinical Narrative:                   CSW received consult for possible SNF placement at time of discharge. CSW spoke with patient regarding PT recommendation of SNF placement at time of discharge.CSW used spanish interpretor machine/device to communicate with patient and patients spouse at bedside. Patient expressed understanding of PT recommendation and is agreeable to SNF placement at time of discharge. Patient is agreeable for CSW to fax out initial referral to [facility] area for possible SNF placement . CSW discussed insurance authorization process.CSW explained to patient that due to patient having no insurance that it may be a barrier to SNF placement. Patient gave CSW permission to reach out to financial counseling to screen patient for Medicaid, Patient has received the COVID vaccines. CSW called and left voicemail with Elie Confer in financial counseling to get patient screened for Medicaid.CSW awaiting callback from [place].No further questions reported at this time. CSW to continue to follow and assist with discharge planning needs.  Expected Discharge Plan: Skilled Nursing Facility Barriers to Discharge: Continued Medical Work up   Patient Goals and CMS Choice Patient states their goals for this hospitalization and ongoing recovery are:: to go to SNF CMS Medicare.gov Compare Post Acute Care list provided to:: Patient Choice offered to / list presented to : Patient  Expected Discharge Plan and Services Expected Discharge Plan: [place] arrangements for the past 2 months: Single Family Home                                      Prior Living Arrangements/Services Living arrangements for the past 2 months: Single  Family Home Lives with:: Self, Spouse Patient language and need for interpreter reviewed:: Yes Do you feel safe going back to the place where you live?: No   SNF  Need for Family Participation in Patient Care: Yes (Comment) Care giver support system in place?: Yes (comment)   Criminal Activity/Legal Involvement Pertinent to Current Situation/Hospitalization: No - Comment as needed  Activities of Daily Living      Permission Sought/Granted Permission sought to share information with : Case Manager, Family Supports, Customer service manager Permission granted to share information with : Yes, Verbal Permission Granted  Share Information with NAME: Verdis Frederickson  Permission granted to share info w AGENCY: SNF  Permission granted to share info w Relationship: Spouse  Permission granted to share info w Contact Information: Verdis Frederickson [phone]  Emotional Assessment Appearance:: Appears stated age Attitude/Demeanor/Rapport: Gracious Affect (typically observed): Calm Orientation: : Oriented to Self, Oriented to Place, Oriented to  Time, Oriented to Situation Alcohol / Substance Use: Not Applicable Psych Involvement: No (comment)  Admission diagnosis:  Fever and neutropenia ([place]) [D70.9, R50.81] Patient Active Problem List   Diagnosis Date Noted  . Delirium 03/24/2020  . Pneumonia 03/21/2020  . Sepsis ([place]) 03/21/2020  . AMS (altered mental status) 03/20/2020  . Intracerebral hemorrhage 03/20/2020  . MSSA bacteremia   . Acute blood loss anemia   . Pancytopenia ([place])   . 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 Pressure injury of skin 03/17/2020  . Cord compression ([place])  03/03/2020  . Incomplete paraplegia ([place]) 03/03/2020  . Elevated liver enzymes 02/26/2020  . Diabetes mellitus ([place]) 02/26/2020  . Hypertension   . Constipation   . Multiple myeloma not having achieved remission ([place]) 02/18/2020  . Hypercalcemia 02/14/2020  . AKI (acute kidney injury) ([place]) 02/14/2020  .  Pathologic fracture 02/14/2020  . Hyponatremia 02/14/2020   PCP:  Patient, No Pcp Per Pharmacy:   [place] ([place]), [place] - [address] [place] [address] Phone: [phone] Fax: [place], [place] [address] [place] [address] Phone: [phone] Fax: [phone]     Social Determinants of Health (SDOH) Interventions    Readmission Risk Interventions Readmission Risk Prevention Plan 02/29/2020  Transportation Screening Complete  Medication Review (RN Care Manager) Complete  Some recent data might be hidden

## 2020-03-29 ENCOUNTER — Inpatient Hospital Stay (HOSPITAL_COMMUNITY): Payer: Self-pay

## 2020-03-29 LAB — CBC
HCT: 20.5 % — ABNORMAL LOW (ref 39.0–52.0)
HCT: 20.8 % — ABNORMAL LOW (ref 39.0–52.0)
Hemoglobin: 7.1 g/dL — ABNORMAL LOW (ref 13.0–17.0)
Hemoglobin: 7.1 g/dL — ABNORMAL LOW (ref 13.0–17.0)
MCH: 29.3 pg (ref 26.0–34.0)
MCH: 30.2 pg (ref 26.0–34.0)
MCHC: 34.1 g/dL (ref 30.0–36.0)
MCHC: 34.6 g/dL (ref 30.0–36.0)
MCV: 86 fL (ref 80.0–100.0)
MCV: 87.2 fL (ref 80.0–100.0)
Platelets: 123 10*3/uL — ABNORMAL LOW (ref 150–400)
Platelets: 45 10*3/uL — ABNORMAL LOW (ref 150–400)
RBC: 2.35 MIL/uL — ABNORMAL LOW (ref 4.22–5.81)
RBC: 2.42 MIL/uL — ABNORMAL LOW (ref 4.22–5.81)
RDW: 13.2 % (ref 11.5–15.5)
RDW: 13.4 % (ref 11.5–15.5)
WBC: 4.1 10*3/uL (ref 4.0–10.5)
WBC: 4.9 10*3/uL (ref 4.0–10.5)
nRBC: 0.7 % — ABNORMAL HIGH (ref 0.0–0.2)
nRBC: 1.4 % — ABNORMAL HIGH (ref 0.0–0.2)

## 2020-03-29 LAB — PROTIME-INR
INR: 1.2 (ref 0.8–1.2)
Prothrombin Time: 14.4 seconds (ref 11.4–15.2)

## 2020-03-29 LAB — COMPREHENSIVE METABOLIC PANEL
ALT: 17 U/L (ref 0–44)
AST: 38 U/L (ref 15–41)
Albumin: 1.6 g/dL — ABNORMAL LOW (ref 3.5–5.0)
Alkaline Phosphatase: 78 U/L (ref 38–126)
Anion gap: 8 (ref 5–15)
BUN: 19 mg/dL (ref 6–20)
CO2: 26 mmol/L (ref 22–32)
Calcium: 9 mg/dL (ref 8.9–10.3)
Chloride: 95 mmol/L — ABNORMAL LOW (ref 98–111)
Creatinine, Ser: 0.79 mg/dL (ref 0.61–1.24)
GFR calc Af Amer: >60 mL/min (ref >60)
GFR calc non Af Amer: >60 mL/min (ref >60)
Glucose, Bld: 173 mg/dL — ABNORMAL HIGH (ref 70–99)
Potassium: 3.7 mmol/L (ref 3.5–5.1)
Sodium: 129 mmol/L — ABNORMAL LOW (ref 135–145)
Total Bilirubin: 0.2 mg/dL — ABNORMAL LOW (ref 0.3–1.2)
Total Protein: 5.1 g/dL — ABNORMAL LOW (ref 6.5–8.1)

## 2020-03-29 LAB — GLUCOSE, CAPILLARY
Glucose-Capillary: 134 mg/dL — ABNORMAL HIGH (ref 70–99)
Glucose-Capillary: 109 mg/dL — ABNORMAL HIGH (ref 70–99)
Glucose-Capillary: 176 mg/dL — ABNORMAL HIGH (ref 70–99)
Glucose-Capillary: 127 mg/dL — ABNORMAL HIGH (ref 70–99)

## 2020-03-29 LAB — BRAIN NATRIURETIC PEPTIDE: B Natriuretic Peptide: 89.8 pg/mL (ref 0.0–100.0)

## 2020-03-29 LAB — TROPONIN I (HIGH SENSITIVITY): Troponin I (High Sensitivity): 10 ng/L (ref <18)

## 2020-03-29 MED ORDER — MIDAZOLAM HCL 2 MG/2ML IJ SOLN
INTRAMUSCULAR | Status: AC
  Filled 2020-03-29: qty 2

## 2020-03-29 MED ORDER — FENTANYL CITRATE (PF) 100 MCG/2ML IJ SOLN
INTRAMUSCULAR | Status: AC | PRN
  Administered 2020-03-29 (×2): 12.5 ug via INTRAVENOUS

## 2020-03-29 MED ORDER — FUROSEMIDE 10 MG/ML IJ SOLN
40.0000 mg | Freq: Two times a day (BID) | INTRAMUSCULAR | Status: AC
  Administered 2020-03-29 (×2): 40 mg via INTRAVENOUS
  Filled 2020-03-29 (×2): qty 4

## 2020-03-29 MED ORDER — SODIUM CHLORIDE 0.9% IV SOLUTION: Freq: Once | INTRAVENOUS | Status: DC

## 2020-03-29 MED ORDER — MIDAZOLAM HCL 2 MG/2ML IJ SOLN
INTRAMUSCULAR | Status: AC | PRN
  Administered 2020-03-29: 0.5 mg via INTRAVENOUS

## 2020-03-29 MED ORDER — FENTANYL CITRATE (PF) 100 MCG/2ML IJ SOLN
INTRAMUSCULAR | Status: AC
  Filled 2020-03-29: qty 2

## 2020-03-29 MED ORDER — GELATIN ABSORBABLE 12-7 MM EX MISC
CUTANEOUS | Status: AC
  Filled 2020-03-29: qty 1

## 2020-03-29 MED ORDER — LIDOCAINE HCL 1 % IJ SOLN
INTRAMUSCULAR | Status: AC
  Filled 2020-03-29: qty 20

## 2020-03-29 MED ORDER — POTASSIUM CHLORIDE CRYS ER 20 MEQ PO TBCR
40.0000 meq | EXTENDED_RELEASE_TABLET | Freq: Two times a day (BID) | ORAL | Status: AC
  Administered 2020-03-29 (×2): 40 meq via ORAL
  Filled 2020-03-29 (×2): qty 2

## 2020-03-29 NOTE — Progress Notes (Signed)
Patient transferred to IR. Called Graciela to interpret for patient during procedure. Graciela went to IR to interpret and accompanied back to [place] and spoke to patient and wife, answered questions.

## 2020-03-29 NOTE — Progress Notes (Signed)
PROGRESS NOTE    Trevor Shelton  VEL:[id number] DOB: 07/11/1965 DOA: 04/07/2020 PCP: Patient, No Pcp Per   Brief Narrative: 54/M with very long complex hospitalization was admitted to [facility] long hospital on 02/14/20 with back pain was found to have diffuse lytic lesions throughout the skeleton, epidural tumor extension with pathological fracture of T6 and T7, contrast-enhancing mass replacing most of T5 vertebral body with severe cord compression, and acute renal failure, required couple of hemodialysis sessions, kidney function improved. -Followed by medical and radiation oncology, treated with IV Decadron and has completed 10/10 doses of XRT.  Work-up consistent with newly diagnosed IgG kappa MM with plans for CyBorD chemo to start following completion of XRT.   -Following transfer to rehab, he underwent chemotherapy on 03/15/2020 and next chemotherapy was scheduled for 03/22/2020.  Patient had significant nosebleed on 03/17/2020.  -Subsequently spiked fevers with T-max 102 -blood culture grew MSSA bacteremia, started on IV Ancef seen by ID as well for this -During this hospitalization on a CT he was also noted to have a 5 x 2 cm left upper lobe chest wall mass  -9/13 night lethargic less responsive, seen by neurology, suspicion for seizure-like activity, started on Keppra, EEG negative, MRI noted acute intracranial hemorrhage in the right frontal convexity, followed by neurology for this Mental status slowly improving -I picked up his care 9/19 -Has been having chest pain throughout at the site of his chest wall mass, CTA neg for PE [facility] course also complicated by ileus and pleural effusions from third spacing, now improving -9/20-21: Transfused platelets for chest wall mass biopsy 9/21: CT-guided biopsy of left anterior chest wall mass   Assessment & Plan:  Acute intracranial hemorrhage  -On 9/12 patient was noted to be disoriented, CT head noted right frontal lesion concerning for  hemorrhage , MRI noted acute hemorrhage involving anterior-inferior right frontal convexity, this is felt to be secondary to severe thrombocytopenia from effect of multiple myeloma and chemotherapy.  -Repeat CT head decreased size of hemorrhage -Appreciate neurology input, recommended baby aspirin when platelet count is higher and cleared by oncology -transfuse plst if <10K or active bleeding -Mental status has improved and stabilized  Severe Sepsis  -MSSA bacteremia:  -Hospital-acquired MSSA bacteremia, clinically improved quickly after antibiotics seen by infectious disease for same  -Repeat blood cultures are negative  -2D echocardiogram was unremarkable, no central lines at that time  -TEE deferred especially in the setting of severe thrombocytopenia and complicated hospitalization -ID recommended to continue Ancef through 9/27  Metastatic multiple myeloma Epidural tumor extension at T6 and 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  -Rx w/ velcade and dexamethasone on 9/7, acyclovir, diflucan. -Oncology following, further chemotherapy on hold with acute infection -has minimal strength in LLE and 2-3 in RLE -Sent a message to oncology Dr. Lorenso Courier to discuss prognosis, appears to be at high risk of ongoing complications, albumin is 1.5 -Remains on Decadron every other day and prophylactic acyclovir -Await oncology opinion, palliative care involvement may not be unreasonable  Pancytopenia/ Anemia/severe Thrombcytopenia -secondary to metastatic myeloma and recent chemo  -S/p 1 unit platelet transfusion 9/13.  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   -There is question of seizure so was transferred to neuro ICU 9/14 and is on Keppra.EEG showing moderate encephalopathy no epileptiform discharges.  On 9/15, had a staring episode seen by neurology and EEG stat no acute finding.  Has improved since then.  Epistaxis-resolved.   -Continue Afrin-prn  Chest pain/diffuse body pain  -Secondary to diffuse bony involvement and chest wall mass -EKG unremarkable, troponins have been negative, CTA negative for PE -Continue narcotics as needed  Pleural effusions Hyponatremia Third spacing: -Diuresed with Lasix intermittently, continue Lasix for 2 more doses today and then as needed, albumin is only 1.5  AKI/Short term HD in august 2021:  -Renal function improved and stable  T2DM-newly diagnosed in august 2021. a1c 6.5.  -CBGs fairly stable now continue Lantus and sliding scale insulin  Chronic constipation Ileus -Having BMs now, 2 days ago -Continue laxatives  HTN/HLD-  -bp meds amlodipine on hold, on coreg low dose and is on Midodrine-  -BP more stable now -Wean off midodrine as tolerated  Severe hypoalbuminemia -Could be secondary to underlying malignancy and complicated hospitalization  Weakness/debility/physical deconditioning:  Lower extremity weakness from spinal cord involvement -Social work following  DVT prophylaxis: SCDs Start: 03/20/20 0020 Code  Status:   Code Status: DNR , after discussion w/ pt and wife 9/19 Spanish interpreter used Family Communication: Discussed with patient and wife at bedside with iPad Spanish interpreter  Status is: Inpatient Remains inpatient appropriate because:Ongoing diagnostic testing needed not appropriate for outpatient work up and Remains hospitalized due to ongoing management of MSSA sepsis, pancytopenia  Dispo: The patient is from: [facility]              Anticipated d/c is to: Possibly SNF              Anticipated d/c date is: > 3 days              Patient currently is not medically stable to d/c. Nutrition: Diet Order            Diet 2 gram sodium Room service appropriate? Yes; Fluid consistency: Thin  Diet effective now                Body mass index is 26.99 kg/m.  Consultants:see note  Procedures: see note.  CT chest abd pelvis w/ contrast: Mild to moderate severity bilateral areas of atelectasis and/or infiltrate. 2. 5.2 cm x 1.5 cm pleural based soft tissue mass along the anterior aspect of the left upper lobe. A 1.4 cm pleural based soft tissue nodule is seen within this region on the prior study. These findings are concerning for the presence of a primary lung malignancy. Further evaluation with a nuclear medicine PET/CT is recommended. 3. Findings consistent with multiple myeloma versus diffuse osseous metastasis, with chronic pathologic fractures at the levels of T6 and T9 vertebral bodies. 4. Markedly enlarged prostate gland. 5. 4.0 cm x 3.2 cm fat-containing right scrotal hernia. 6. Mild cardiomegaly.  7. Small hiatal hernia. 8. Aortic atherosclerosis. 9. Small simple cyst within the left kidney.  Microbiology:see note Blood Culture    Component Value Date/Time   SDES BLOOD RIGHT HAND 03/21/2020 1359   SDES BLOOD LEFT HAND 03/21/2020 1359   SPECREQUEST  03/21/2020 1359    BOTTLES DRAWN AEROBIC AND ANAEROBIC Blood Culture results may not be optimal due to an excessive  volume of blood received in culture bottles   SPECREQUEST  03/21/2020 1359    BOTTLES DRAWN AEROBIC AND ANAEROBIC Blood Culture results may not be optimal due to an excessive volume of blood received in culture bottles   CULT  03/21/2020 1359    NO GROWTH 5 DAYS Performed at [facility], [place] [address], [place], [place] [address]    CULT  03/21/2020 1359    NO GROWTH 5 DAYS Performed at [facility], [place] [address], [place], [place] [address]    REPTSTATUS 03/26/2020 FINAL 03/21/2020 1359   REPTSTATUS 03/26/2020 FINAL 03/21/2020 1359    Other culture-see note  Medications: Scheduled Meds: . (feeding supplement) PROSource Plus  30 mL Oral BID BM  . sodium chloride   Intravenous Once  . sodium chloride   Intravenous Once  . acyclovir  400 mg Oral Daily  . carvedilol  3.125 mg Oral BID WC  . dexamethasone (DECADRON) injection  2 mg Intravenous QODAY  . fentaNYL      . furosemide  40 mg Intravenous Q12H  . gelatin adsorbable      . insulin aspart  0-9 Units Subcutaneous TID WC  . insulin aspart  3 Units Subcutaneous TID WC  . insulin glargine  15 Units Subcutaneous QHS  . lactulose  20 g Oral TID  . levETIRAcetam  500 mg Oral BID  . lidocaine  2 patch Transdermal Q24H  . lidocaine      . melatonin  3 mg Oral QHS  . midazolam      . midodrine  5 mg Oral BID WC  . pantoprazole  40 mg Oral BID AC  . potassium chloride  40 mEq Oral BID  . Ensure Max Protein  11 oz Oral BID BM  . QUEtiapine  12.5 mg Oral QHS  . senna-docusate  2 tablet Oral BID  . sodium chloride  2 spray Each Nare Q1H while awake  . sucralfate  1 g Oral TID WC & HS   Continuous Infusions: . sodium chloride Stopped (03/28/20 0000)  .  ceFAZolin (ANCEF) IV 2 g (03/29/20 1146)    Antimicrobials: Anti-infectives (From admission, onward)   Start     Dose/Rate Route Frequency Ordered Stop   03/20/20 1600  ceFAZolin (ANCEF) IVPB 2g/100 mL premix        2 g 200 mL/hr over 30 Minutes Intravenous  Every 8 hours 03/20/20 1505     03/20/20 1000  acyclovir (ZOVIRAX) 200 MG capsule 400 mg        400 mg Oral Daily 04/06/2020 2217     03/20/20 1000  fluconazole (DIFLUCAN) tablet 100 mg        100 mg Oral Daily 03/17/2020 2217 03/23/20 0959   03/20/20 0800  vancomycin (VANCOREADY) IVPB 750 mg/150 mL  Status:  Discontinued        750 mg 150 mL/hr over 60 Minutes Intravenous Every 12 hours 03/24/2020 2217 03/20/20 1452   03/20/20 0800  ceFEPIme (MAXIPIME) 2 g in sodium chloride 0.9 % 100 mL IVPB  Status:  Discontinued  2 g 200 mL/hr over 30 Minutes Intravenous Every 8 hours 03/20/20 0001 03/20/20 1452   03/20/20 0015  ceFEPIme (MAXIPIME) 2 g in sodium chloride 0.9 % 100 mL IVPB        2 g 200 mL/hr over 30 Minutes Intravenous  Once 03/20/20 0001 03/20/20 0159   03/20/2020 2230  vancomycin (VANCOREADY) IVPB 1250 mg/250 mL  Status:  Discontinued        1,250 mg 166.7 mL/hr over 90 Minutes Intravenous  Once 03/15/2020 2217 04/07/2020 2225   03/12/2020 2224  piperacillin-tazobactam (ZOSYN) IVPB 3.375 g  Status:  Discontinued        3.375 g 12.5 mL/hr over 240 Minutes Intravenous Every 8 hours 04/04/2020 2217 03/20/20 0000     Objective: Vitals: Today's Vitals   03/29/20 0930 03/29/20 0935 03/29/20 0940 03/29/20 0945  BP: 126/68 119/71 121/76 120/72  Pulse: (!) 109 (!) 110 (!) 108 (!) 108  Resp: (!) 27 (!) 26 (!) 25 (!) 24  Temp:      TempSrc:      SpO2: 97% 96% 96% 97%  Weight:      PainSc:        Intake/Output Summary (Last 24 hours) at 03/29/2020 1309 Last data filed at 03/29/2020 1146 Gross per 24 hour  Intake 947 ml  Output 1025 ml  Net -78 ml   Filed Weights   03/26/20 0500 03/27/20 0419 03/28/20 2218  Weight: 61 kg 69.2 kg 69.1 kg   Weight change:   Intake/Output from previous day: 09/20 0701 - 09/21 0700 In: 917 [Blood:817; IV Piggyback:100] Out: 3550 [Urine:3550] Intake/Output this shift: Total I/O In: 130 [Blood:30; IV Piggyback:100] Out: 725  [Urine:725]  Examination:  General exam: Chronically ill middle-aged male appears much older than stated age, sitting up in bed, AAOx3, HEENT: Oral mucosa is moist, neck with positive JVD Chest: Tenderness across the chest wall, biopsy site noted CVS: S1-S2, regular rate rhythm Lungs: Poor air movement bilaterally, few basilar rales Abdomen: Soft, less distended, bowel sounds present Extremities: Trace edema  Psych: Flat affect  Data Reviewed: I have personally reviewed following labs and imaging studies CBC: Recent Labs  Lab 03/23/20 0607 03/23/20 0607 03/24/20 1018 03/24/20 1018 03/24/2020 0704 04/03/2020 0704 03/26/20 0254 03/26/20 0254 03/27/20 1537 03/28/20 0355 03/28/20 1538 03/29/20 0040 03/29/20 0816  WBC 3.4*   < > 5.1   < > 4.9   < > 4.0  --  4.3  --  5.5 4.9 4.1  NEUTROABS 2.8  --  4.6  --  4.0  --  3.3  --  4.0  --   --   --   --   HGB 7.8*   < > 8.9*   < > 8.1*   < > 7.1*   < > 6.2* 7.7* 8.1* 7.1* 7.1*  HCT 23.2*   < > 25.7*   < > 23.8*   < > 21.4*   < > 18.1* 22.7* 23.4* 20.8* 20.5*  MCV 86.2   < > 85.1   < > 86.5   < > 86.3  --  87.9  --  85.7 86.0 87.2  PLT 41*   < > 50*   < > 40*   < > 35*  --  25*  --  38* 45* 123*   < > = values in this interval not displayed.   Basic Metabolic Panel: Recent Labs  Lab 03/23/20 0607 03/24/20 1018 03/24/2020 0704 03/26/20 0254 03/27/20 0139 03/28/20 0852 03/29/20 0040  NA 129*   < > 127* 127* 124* 126* 129*  K 4.0   < > 3.7 3.8 3.6 3.1* 3.7  CL 102   < > 100 100 96* 93* 95*  CO2 19*   < > 19* 17* 21* 25 26  GLUCOSE 266*   < > 205* 192* 138* 146* 173*  BUN 40*   < > 41* 35* 21* 16 19  CREATININE 0.94   < > 1.23 0.88 0.63 0.62 0.79  CALCIUM 8.2*   < > 8.8* 9.1 8.8* 8.7* 9.0  MG 1.7  --   --   --   --   --   --   PHOS 2.7  --   --   --   --   --   --    < > = values in this interval not displayed.   GFR: Estimated Creatinine Clearance: 90.1 mL/min (by C-G formula based on SCr of 0.79 mg/dL). Liver Function  Tests: Recent Labs  Lab 03/24/20 1018 03/22/2020 0704 03/26/20 0254 03/27/20 0139 03/29/20 0040  AST _0 38  ALT _1 ALKPHOS 78 79 70 73 78  BILITOT 0.4 0.5 0.4 0.6 0.2*  PROT 5.7* 5.6* 5.2* 5.2* 5.1*  ALBUMIN 1.6* 1.6* 1.5* 1.5* 1.6*   No results for input(s): LIPASE, AMYLASE in the last 168 hours. No results for input(s): AMMONIA in the last 168 hours. Coagulation Profile: Recent Labs  Lab 03/29/20 0040  INR 1.2   Cardiac Enzymes: No results for input(s): CKTOTAL, CKMB, CKMBINDEX, TROPONINI in the last 168 hours. BNP (last 3 results) No results for input(s): PROBNP in the last 8760 hours. HbA1C: No results for input(s): HGBA1C in the last 72 hours. CBG: Recent Labs  Lab 03/28/20 1144 03/28/20 1703 03/28/20 2104 03/29/20 0752 03/29/20 1140  GLUCAP 163* 212* 168* 134* 109*   Lipid Profile: No results for input(s): CHOL, HDL, LDLCALC, TRIG, CHOLHDL, LDLDIRECT in the last 72 hours. Thyroid Function Tests: No results for input(s): TSH, T4TOTAL, FREET4, T3FREE, THYROIDAB in the last 72 hours. Anemia Panel: No results for input(s): VITAMINB12, FOLATE, FERRITIN, TIBC, IRON, RETICCTPCT in the last 72 hours. Sepsis Labs: No results for input(s): PROCALCITON, LATICACIDVEN in the last 168 hours.  Recent Results (from the past 240 hour(s))  Culture, Urine     Status: Abnormal   Collection Time: 03/12/2020  2:28 PM   Specimen: Urine, Random  Result Value Ref Range Status   Specimen Description URINE, RANDOM  Final   Special Requests NONE  Final   Culture (A)  Final    <10,000 COLONIES/mL INSIGNIFICANT GROWTH Performed at [facility], [address] [place], [place] [address]    Report Status 03/20/2020 FINAL  Final  Culture, blood (routine x 2)     Status: Abnormal   Collection Time: 04/04/2020  6:02 PM   Specimen: BLOOD  Result Value Ref Range Status   Specimen Description BLOOD RIGHT ANTECUBITAL  Final   Special Requests   Final    BOTTLES  DRAWN AEROBIC AND ANAEROBIC Blood Culture adequate volume   Culture  Setup Time   Final    GRAM POSITIVE COCCI IN CLUSTERS IN BOTH AEROBIC AND ANAEROBIC BOTTLES Organism ID to follow CRITICAL RESULT CALLED TO, READ BACK BY AND VERIFIED WITH: Barth Kirks PHARMD, AT [place] 03/20/20 BY Rush Landmark Performed at [facility], [place] [address] [place], [place] [address]    Culture STAPHYLOCOCCUS AUREUS (A)  Final   Report Status 03/22/2020 FINAL  Final   Organism ID, Bacteria STAPHYLOCOCCUS AUREUS  Final      Susceptibility   Staphylococcus aureus - MIC*    CIPROFLOXACIN <=0.5 SENSITIVE Sensitive     ERYTHROMYCIN <=0.25 SENSITIVE Sensitive     GENTAMICIN <=0.5 SENSITIVE Sensitive     OXACILLIN 0.5 SENSITIVE Sensitive     TETRACYCLINE <=1 SENSITIVE Sensitive     VANCOMYCIN 1 SENSITIVE Sensitive     TRIMETH/SULFA <=10 SENSITIVE Sensitive     CLINDAMYCIN <=0.25 SENSITIVE Sensitive     RIFAMPIN <=0.5 SENSITIVE Sensitive     Inducible Clindamycin NEGATIVE Sensitive     * STAPHYLOCOCCUS AUREUS  Blood Culture ID Panel (Reflexed)     Status: Abnormal   Collection Time: 04/02/2020  6:02 PM  Result Value Ref Range Status   Enterococcus faecalis NOT DETECTED NOT DETECTED Final   Enterococcus Faecium NOT DETECTED NOT DETECTED Final   Listeria monocytogenes NOT DETECTED NOT DETECTED Final   Staphylococcus species DETECTED (A) NOT DETECTED Final    Comment: CRITICAL RESULT CALLED TO, READ BACK BY AND VERIFIED WITH: Barth Kirks PHARMD, AT 0740 03/20/20 BY D. VANHOOK    Staphylococcus aureus (BCID) DETECTED (A) NOT DETECTED Final    Comment: CRITICAL RESULT CALLED TO, READ BACK BY AND VERIFIED WITH: Barth Kirks PHARMD, AT 0740 03/20/20 BY D. VANHOOK    Staphylococcus epidermidis NOT DETECTED NOT DETECTED Final   Staphylococcus lugdunensis NOT DETECTED NOT DETECTED Final   Streptococcus species NOT DETECTED NOT DETECTED Final   Streptococcus agalactiae NOT DETECTED NOT DETECTED Final   Streptococcus  pneumoniae NOT DETECTED NOT DETECTED Final   Streptococcus pyogenes NOT DETECTED NOT DETECTED Final   A.calcoaceticus-baumannii NOT DETECTED NOT DETECTED Final   Bacteroides fragilis NOT DETECTED NOT DETECTED Final   Enterobacterales NOT DETECTED NOT DETECTED Final   Enterobacter cloacae complex NOT DETECTED NOT DETECTED Final   Escherichia coli NOT DETECTED NOT DETECTED Final   Klebsiella aerogenes NOT DETECTED NOT DETECTED Final   Klebsiella oxytoca NOT DETECTED NOT DETECTED Final   Klebsiella pneumoniae NOT DETECTED NOT DETECTED Final   Proteus species NOT DETECTED NOT DETECTED Final   Salmonella species NOT DETECTED NOT DETECTED Final   Serratia marcescens NOT DETECTED NOT DETECTED Final   Haemophilus influenzae NOT DETECTED NOT DETECTED Final   Neisseria meningitidis NOT DETECTED NOT DETECTED Final   Pseudomonas aeruginosa NOT DETECTED NOT DETECTED Final   Stenotrophomonas maltophilia NOT DETECTED NOT DETECTED Final   Candida albicans NOT DETECTED NOT DETECTED Final   Candida auris NOT DETECTED NOT DETECTED Final   Candida glabrata NOT DETECTED NOT DETECTED Final   Candida krusei NOT DETECTED NOT DETECTED Final   Candida parapsilosis NOT DETECTED NOT DETECTED Final   Candida tropicalis NOT DETECTED NOT DETECTED Final   Cryptococcus neoformans/gattii NOT DETECTED NOT DETECTED Final   Meth resistant mecA/C and MREJ NOT DETECTED NOT DETECTED Final    Comment: Performed at [facility], [address], [place], [place] [address]  SARS Coronavirus 2 by RT PCR (hospital order, performed in [facility] hospital lab) Nasopharyngeal Nasopharyngeal Swab     Status: None   Collection Time: 03/24/2020  8:37 PM   Specimen: Nasopharyngeal Swab  Result Value Ref Range Status   SARS Coronavirus 2 NEGATIVE NEGATIVE Final    Comment: (NOTE) SARS-CoV-2 target nucleic acids are NOT DETECTED.  The SARS-CoV-2 RNA is generally detectable in upper and lower respiratory specimens during the acute  phase of infection.  The lowest concentration of SARS-CoV-2 viral copies this assay can detect is 250 copies / mL. A negative result does not preclude SARS-CoV-2 infection and should not be used as the sole basis for treatment or other patient management decisions.  A negative result may occur with improper specimen collection / handling, submission of specimen other than nasopharyngeal swab, presence of viral mutation(s) within the areas targeted by this assay, and inadequate number of viral copies (<250 copies / mL). A negative result must be combined with clinical observations, patient history, and epidemiological information.  Fact Sheet for Patients:   StrictlyIdeas.no  Fact Sheet for Healthcare Providers: BankingDealers.co.za  This test is not yet approved or  cleared by the [place] FDA and has been authorized for detection and/or diagnosis of SARS-CoV-2 by FDA under an Emergency Use Authorization (EUA).  This EUA will remain in effect (meaning this test can be used) for the duration of the COVID-19 declaration under Section 564(b)(1) of the Act, 21 U.S.C. section 360bbb-3(b)(1), unless the authorization is terminated or revoked sooner.  Performed at [facility], [place] [address], [place], [place] [address]   MRSA PCR Screening     Status: None   Collection Time: 03/20/20  4:21 AM   Specimen: Nasopharyngeal  Result Value Ref Range Status   MRSA by PCR NEGATIVE NEGATIVE Final    Comment:        The GeneXpert MRSA Assay (FDA approved for NASAL specimens only), is one component of a comprehensive MRSA colonization surveillance program. It is not intended to diagnose MRSA infection nor to guide or monitor treatment for MRSA infections. Performed at [facility], [place] [address], [place], [place] [address]   Respiratory Panel by PCR     Status: None   Collection Time: 03/20/20  4:21 AM   Specimen:  Nasopharyngeal Swab; Respiratory  Result Value Ref Range Status   Adenovirus NOT DETECTED NOT DETECTED Final   Coronavirus 229E NOT DETECTED NOT DETECTED Final    Comment: (NOTE) The Coronavirus on the Respiratory Panel, DOES NOT test for the novel  Coronavirus (2019 nCoV)    Coronavirus HKU1 NOT DETECTED NOT DETECTED Final   Coronavirus NL63 NOT DETECTED NOT DETECTED Final   Coronavirus OC43 NOT DETECTED NOT DETECTED Final   Metapneumovirus NOT DETECTED NOT DETECTED Final   Rhinovirus / Enterovirus NOT DETECTED NOT DETECTED Final   Influenza A NOT DETECTED NOT DETECTED Final   Influenza B NOT DETECTED NOT DETECTED Final   Parainfluenza Virus 1 NOT DETECTED NOT DETECTED Final   Parainfluenza Virus 2 NOT DETECTED NOT DETECTED Final   Parainfluenza Virus 3 NOT DETECTED NOT DETECTED Final   Parainfluenza Virus 4 NOT DETECTED NOT DETECTED Final   Respiratory Syncytial Virus NOT DETECTED NOT DETECTED Final   Bordetella pertussis NOT DETECTED NOT DETECTED Final   Chlamydophila pneumoniae NOT DETECTED NOT DETECTED Final   Mycoplasma pneumoniae NOT DETECTED NOT DETECTED Final    Comment: Performed at [facility], [place]. [address] [place], [place] [address]  Culture, blood (Routine X 2) w Reflex to ID Panel     Status: None   Collection Time: 03/21/20  1:59 PM   Specimen: BLOOD RIGHT HAND  Result Value Ref Range Status   Specimen Description BLOOD RIGHT HAND  Final   Special Requests   Final    BOTTLES DRAWN AEROBIC AND ANAEROBIC Blood Culture results may not be optimal due to an excessive volume of blood received in culture bottles  Culture   Final    NO GROWTH 5 DAYS Performed at [facility], [place] [address], [place], [place] [address]    Report Status 03/26/2020 FINAL  Final  Culture, blood (Routine X 2) w Reflex to ID Panel     Status: None   Collection Time: 03/21/20  1:59 PM   Specimen: BLOOD LEFT HAND  Result Value Ref Range Status   Specimen Description BLOOD LEFT  HAND  Final   Special Requests   Final    BOTTLES DRAWN AEROBIC AND ANAEROBIC Blood Culture results may not be optimal due to an excessive volume of blood received in culture bottles   Culture   Final    NO GROWTH 5 DAYS Performed at [facility], [place] [address], [place], [place] [address]    Report Status 03/26/2020 FINAL  Final     Radiology Studies: CT ANGIO CHEST PE W OR WO CONTRAST  Result Date: 03/27/2020 CLINICAL DATA:  Chest pain and difficulty breathing. Possible pulmonary embolism. EXAM: CT ANGIOGRAPHY CHEST WITH CONTRAST TECHNIQUE: Multidetector CT imaging of the chest was performed using the standard protocol during bolus administration of intravenous contrast. Multiplanar CT image reconstructions and MIPs were obtained to evaluate the vascular anatomy. CONTRAST:  109m OMNIPAQUE IOHEXOL 350 MG/ML SOLN COMPARISON:  Chest CT 03/20/2020 FINDINGS: Cardiovascular: Mild stable cardiomegaly. Minimal calcified plaque over the left anterior descending and right coronary arteries. Thoracic aorta is normal in caliber. Pulmonary arterial system is well opacified without evidence of emboli. Mediastinum/Nodes: No evidence of mediastinal or hilar adenopathy. Interval development of left axillary adenopathy. Lungs/Pleura: Lungs are adequately inflated and demonstrate slight worsening small bilateral pleural effusions with associated posterior bibasilar atelectasis. Interval worsening of mild patchy bilateral airspace opacification most prominent over the periphery of the right upper lung. Findings may be due to multifocal infection. Airways are unremarkable. Interval enlargement of pleural based mass over the anterolateral left upper thorax measuring 2.3 x 5.6 cm (previously 1.4 x 5.1 cm). Upper Abdomen: No acute findings. Musculoskeletal: Interval progression of diffuse lytic process throughout the axial and appendicular bony structures compatible with known multiple myeloma. Multiple bilateral rib  fractures unchanged. Fracture of the body of the sternum just below the sternomanubrial joint unchanged. Fracture with mild associated soft tissue density/lytic process over the inferior aspect of the right scapula with interval progression. Known pathologic compression fractures T6 and T9 without significant change. All these fracture are likely pathologic secondary to patient's underlying multiple myeloma. Review of the MIP images confirms the above findings. IMPRESSION: 1. No evidence of pulmonary embolism. 2. Interval worsening of mild patchy bilateral airspace process most prominent over the periphery of the right upper lung which may be due to multifocal infection. Slight worsening small bilateral pleural effusions with associated bibasilar atelectasis. 3. Interval enlargement of a pleural based mass over the anterolateral left upper thorax measuring 2.3 x 5.6 cm (previously 1.4 x 5.1 cm). Interval development of left axillary adenopathy. Findings are likely due to patient's underlying multiple myeloma. 4. Stable cardiomegaly and minimal atherosclerotic coronary artery disease. 5. Diffuse lytic process throughout the axial and appendicular bony structures compatible with patient's known multiple myeloma. Multiple associated spinal compression fractures, rib fractures, sternal fracture and right inferior scapular fracture likely pathologic due to underlying myeloma. Electronically Signed   By: DMarin OlpM.D.   On: 03/27/2020 16:06   CT BIOPSY  Result Date: 03/29/2020 INDICATION: 54year old male with history of multiple myeloma and left anterior chest wall mass.  EXAM: CT BIOPSY COMPARISON:  Chest CT from 03/27/2020 MEDICATIONS: None. ANESTHESIA/SEDATION: Fentanyl 25 mcg IV; Versed 0.5 mg IV Sedation time: 18 minutes; The patient was continuously monitored during the procedure by the interventional radiology nurse under my direct supervision. CONTRAST:  None. COMPLICATIONS: None immediate. PROCEDURE:  Informed consent was obtained from the patient following an explanation of the procedure, risks, benefits and alternatives. A time out was performed prior to the initiation of the procedure. The patient was positioned supine on the CT table and a limited CT was performed for procedural planning demonstrating similar appearing left anterior chest wall soft tissue mass invading the anterior second and third ribs measuring approximately 5.0 x 3.1 cm in axial dimension. The procedure was planned. The operative site was prepped and draped in the usual sterile fashion. Appropriate trajectory was confirmed with a 22 gauge spinal needle after the adjacent tissues were anesthetized with 1% Lidocaine with epinephrine. Under intermittent CT guidance, a 17 gauge coaxial needle was advanced into the peripheral aspect of the mass. Appropriate positioning was confirmed and a total of 5 samples were obtained with an 18 gauge core needle biopsy device. The co-axial needle was removed while injecting Gel-Foam slurry and hemostasis was achieved with manual compression. A limited postprocedural CT was negative for hemorrhage or additional complication. A dressing was placed. The patient tolerated the procedure well without immediate postprocedural complication. IMPRESSION: Technically successful CT guided core needle biopsy of left anterior chest wall mass. Electronically Signed   By: Ruthann Cancer MD   On: 03/29/2020 10:33   DG CHEST PORT 1 VIEW  Result Date: 03/28/2020 CLINICAL DATA:  Tachypnea EXAM: PORTABLE CHEST 1 VIEW COMPARISON:  03/27/2020 CT FINDINGS: Cardiac shadow is stable. Diffuse bilateral infiltrates are again seen similar to that noted on prior chest CT. No sizable effusion is noted. No pneumothorax is noted. Lytic lesions are noted throughout the bony skeleton particularly in the midthoracic spine similar to that seen on recent CT examination. IMPRESSION: Patchy bilateral infiltrate stable from the prior exam.  Stable lytic lesions within the bony structures. Electronically Signed   By: Inez Catalina M.D.   On: 03/28/2020 23:04     LOS: 10 days   Domenic Polite, MD [facility]  03/29/2020, 1:09 PM

## 2020-03-29 NOTE — Progress Notes (Signed)
30 Notified Dr. Broadus John, patient returned from IR with shortness of breath, on 3 liters O2 via [place] with O2 sats 93-94%. Prior to going to IR patient was on room air with O2 sats 96-98%. Lung sounds bibasilar rales at this time, diminished prior to IR procedure. Platelets infusing when received from IR.  Patient given Morphine 2 mg IV and Lasix 40 mg IV at 1130. 1200 patient resting with no shortness of breath, Urine output 725 mls clear yellow urine. Patient removed oxygen and O2 sats 93-95%.  Dr. Broadus John into see patient and review plan of care with patient and wife and answer questions.

## 2020-03-29 NOTE — Progress Notes (Addendum)
PT Cancellation Note  Patient Details Name: Trevor Shelton MRN: [id number] DOB: 12/18/1965   Cancelled Treatment:    Reason Eval/Treat Not Completed: Patient at procedure or test/unavailable (CT guided biopsy of left anterior chest wall mass). Will follow-up for PT treatment as schedule permits.  Mabeline Caras, PT, DPT Acute Rehabilitation Services  Pager [phone] Office [phone]  Derry Lory 03/29/2020, 8:29 AM

## 2020-03-29 NOTE — Procedures (Signed)
Interventional Radiology Procedure Note  Procedure: CT guided biopsy of left anterior chest wall mass  Complications: None immediate  EBL:  <10 mL  Recommendations: - Bedrest for 2 hours - No need for post biopsy chest radiograph as pleura was not transgressed during biopsy - Follow up biopsy results    Trevor Cancer, MD

## 2020-03-29 NOTE — Sedation Documentation (Signed)
On arrival to IR, prior to transfusion of blood product and procedure, patient states he is short of breath which started over night. Post procedure, patient transported to [facility] by this RN and CT tech. Patient has platelets infusing @ 162mL/hr at time of transfer. Platelet transfusion rate maintained at 122mL/hr due to shortness of breath, ordering MD aware. Telephone and bedside report given to nurse Mickel Baas. Platelet transfusion start time and rate verified with RN @ bedside.

## 2020-03-30 ENCOUNTER — Inpatient Hospital Stay (HOSPITAL_COMMUNITY): Payer: Self-pay

## 2020-03-30 ENCOUNTER — Other Ambulatory Visit: Payer: Self-pay

## 2020-03-30 DIAGNOSIS — I619 Nontraumatic intracerebral hemorrhage, unspecified: Secondary | ICD-10-CM

## 2020-03-30 LAB — BPAM PLATELET PHERESIS
Blood Product Expiration Date: 202109212359
Blood Product Expiration Date: 202109232359
Blood Product Expiration Date: 202109232359
Blood Product Expiration Date: 202109232359
ISSUE DATE / TIME: 202109202119
ISSUE DATE / TIME: 202109210022
ISSUE DATE / TIME: 202109210314
ISSUE DATE / TIME: 202109210838
Unit Type and Rh: 5100
Unit Type and Rh: 5100
Unit Type and Rh: 6200
Unit Type and Rh: 6200

## 2020-03-30 LAB — BASIC METABOLIC PANEL
Anion gap: 12 (ref 5–15)
BUN: 23 mg/dL — ABNORMAL HIGH (ref 6–20)
CO2: 25 mmol/L (ref 22–32)
Calcium: 9.1 mg/dL (ref 8.9–10.3)
Chloride: 92 mmol/L — ABNORMAL LOW (ref 98–111)
Creatinine, Ser: 0.77 mg/dL (ref 0.61–1.24)
GFR calc Af Amer: >60 mL/min (ref >60)
GFR calc non Af Amer: >60 mL/min (ref >60)
Glucose, Bld: 215 mg/dL — ABNORMAL HIGH (ref 70–99)
Potassium: 4.2 mmol/L (ref 3.5–5.1)
Sodium: 129 mmol/L — ABNORMAL LOW (ref 135–145)

## 2020-03-30 LAB — CBC
HCT: 23.2 % — ABNORMAL LOW (ref 39.0–52.0)
Hemoglobin: 7.9 g/dL — ABNORMAL LOW (ref 13.0–17.0)
MCH: 30.3 pg (ref 26.0–34.0)
MCHC: 34.1 g/dL (ref 30.0–36.0)
MCV: 88.9 fL (ref 80.0–100.0)
Platelets: 141 10*3/uL — ABNORMAL LOW (ref 150–400)
RBC: 2.61 MIL/uL — ABNORMAL LOW (ref 4.22–5.81)
RDW: 13.8 % (ref 11.5–15.5)
WBC: 4.3 10*3/uL (ref 4.0–10.5)
nRBC: 1.2 % — ABNORMAL HIGH (ref 0.0–0.2)

## 2020-03-30 LAB — PREPARE PLATELET PHERESIS
Unit division: 0
Unit division: 0
Unit division: 0
Unit division: 0

## 2020-03-30 LAB — GLUCOSE, CAPILLARY
Glucose-Capillary: 155 mg/dL — ABNORMAL HIGH (ref 70–99)
Glucose-Capillary: 220 mg/dL — ABNORMAL HIGH (ref 70–99)
Glucose-Capillary: 250 mg/dL — ABNORMAL HIGH (ref 70–99)
Glucose-Capillary: 205 mg/dL — ABNORMAL HIGH (ref 70–99)

## 2020-03-30 MED ORDER — FUROSEMIDE 10 MG/ML IJ SOLN
40.0000 mg | Freq: Once | INTRAMUSCULAR | Status: AC
  Administered 2020-03-30: 40 mg via INTRAVENOUS
  Filled 2020-03-30: qty 4

## 2020-03-30 NOTE — Progress Notes (Signed)
Patient ID: Trevor Shelton, male   DOB: 13-Mar-1966, 54 y.o.   MRN: [id number]  PROGRESS NOTE    Trevor Shelton  ZYS:[id number] DOB: [birth year] DOA: 03/26/2020 PCP: Patient, No Pcp Per   Brief Narrative:  54 year old male with very long/complex hospitalization at [facility] long hospital recently in 02/2020 with back pain and was found to have diffuse lytic lesions throughout the skeleton, epidural tumor extension with pathological fracture of T6 and T7, contrast-enhancing mass replacing most of T5 vertebral body with severe cord compression, and acute renal failure, required couple of hemodialysis sessions, kidney function improved. --Followed by medical and radiation oncology, treated with IV Decadron and has completed 10/10 doses of XRT. Work-up consistent with newly diagnosed IgG kappa MM with plans for CyBorD chemo to start following completion of XRT.  -Following transfer to rehab, he underwent chemotherapy on 03/15/2020 and next chemotherapy was scheduled for 03/22/2020.Patient had significant nosebleed on 03/17/2020. -Subsequently spiked fevers with T-max 102 -blood culture grew MSSA bacteremia, started on IV Ancef seen by ID as well for this -During this hospitalization; he was also noted to have a 5 x 2 cm left upper lobe chest wall mass on CT. -On 03/21/2020: He was found to be lethargic/less responsive.  Seen by neurology: Suspicious for seizure activity.  Started on Keppra.  EEG negative.  MRI noted to have acute intracranial hemorrhage in the right frontal convexity, followed by neurology for this.  Mental status slowly improving. -Patient has been complaining of chest pain throughout at the site of his chest wall mass.  CTA negative for PE [facility] course complicated by ileus and pleural effusion from third spacing, now improving -03/28/2020: Received platelet transfusion.  03/29/2020: CT-guided biopsy of left anterior chest wall mass  Assessment & Plan:   Acute intracranial hemorrhage -Followed by  medical and radiation oncology, treated with IV Decadron and has completed 10/10 doses of XRT. Work-up consistent with newly diagnosed IgG kappa MM with plans for CyBorD chemo to start following completion of XRT.  -Following transfer to rehab, he underwent chemotherapy on 03/15/2020 and next chemotherapy was scheduled for 03/22/2020.Patient had significant nosebleed on 03/17/2020. -Subsequently spiked fevers with T-max 102 -blood culture grew MSSA bacteremia, started on IV Ancef seen by ID as well for this -During this hospitalization on a CT he was also noted to have a 5 x 2 cm left upper lobe chest wall mass   Severe sepsis MSSA bacteremia -Currently sepsis is resolved.  Repeat blood cultures have been negative.  2D echo was unremarkable for vegetations.  TEE deferred especially in the setting of severe thrombocytopenia and complicated hospitalization -Currently on IV Ancef.  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  -Rx w/ velcade and dexamethasone on 9/7, acyclovir, diflucan. -Oncology following, further chemotherapy on hold with acute infection -has minimal strength in LLE and 2-3 in RLE -Remains on Decadron every other day and prophylactic acyclovir -Await oncology opinion -Palliative care evaluation for goals of care discussion  Pancytopenia/anemia/severe thrombocytopenia -Status post multiple units of packed red cells transfusion and platelet transfusion during this hospitalization -Monitor.  Transfuse PRBC if hemoglobin less than 7 and platelets less than 10K or active bleeding  Left upper lobe chest wall mass 5 x  2 cm -Had CT guided biopsy in IR on 03/29/2020: Await pathology  Acute encephalopathy multifactorial -Became lethargic and more confused on 03/20/2020.  There was a question of seizure.  He was transferred to neuro ICU on 03/22/2020 and started on Keppra.  EEG showed moderate encephalopathy with no epileptiform discharges. -Currently mental status stable.  Neurology has signed off.  Continue Keppra.  Epistaxis -Resolved.  Continue Afrin as needed  Chest pain/diffuse body pain -Secondary diffuse bony involvement and chest wall mass.  EKG unremarkable.  Troponins negative.  CTA negative for PE.  Continue narcotics as needed  Pleural effusions Hyponatremia Third spacing: -Diuresed with Lasix intermittently  AKI/Short term HD in august 2021:  -Renal function improved and stable  T2DM- newly diagnosed in august 2021. a1c 6.5.  -CBGs fairly stable now continue Lantus and sliding scale insulin  Chronic constipation Ileus -Having BMs now, 2 days ago -Continue laxatives  HTN/HLD-  -bp meds amlodipine on hold, on coreg low dose and is on Midodrine- -BP more stable now -Wean off midodrine as tolerated  Severe hypoalbuminemia -Could be secondary to underlying malignancy and complicated hospitalization  Weakness/debility/physical deconditioning:  Lower extremity weakness from spinal cord involvement -Social work following   DVT prophylaxis: SCDs Code Status: DNR Family Communication: None at bedside Disposition Plan: Status is: Inpatient  Remains inpatient appropriate because:Inpatient level of care appropriate due to severity of illness   Dispo: The patient is from: Home              Anticipated d/c is to: SNF              Anticipated d/c date is: > 3 days              Patient currently is not medically stable to d/c.  Consultants: Neurology/oncology/IR  Procedures: Chest wall mass biopsy on 03/29/2020  Antimicrobials:  Anti-infectives (From admission, onward)    Start     Dose/Rate Route Frequency Ordered Stop   03/20/20 1600  ceFAZolin (ANCEF) IVPB 2g/100 mL premix        2 g 200 mL/hr over 30 Minutes Intravenous Every 8 hours 03/20/20 1505     03/20/20 1000  acyclovir (ZOVIRAX) 200 MG capsule 400 mg        400 mg Oral Daily 03/26/2020 2217     03/20/20 1000  fluconazole (DIFLUCAN) tablet 100 mg        100 mg Oral Daily 04/02/2020 2217 03/23/20 0959   03/20/20 0800  vancomycin (VANCOREADY) IVPB 750 mg/150 mL  Status:  Discontinued        750 mg 150 mL/hr over 60 Minutes Intravenous Every 12 hours 03/22/2020 2217 03/20/20 1452   03/20/20 0800  ceFEPIme (MAXIPIME) 2 g in sodium chloride 0.9 % 100 mL IVPB  Status:  Discontinued        2 g 200 mL/hr over 30 Minutes Intravenous Every 8 hours 03/20/20 0001 03/20/20 1452   03/20/20 0015  ceFEPIme (MAXIPIME) 2 g in sodium chloride 0.9 % 100 mL IVPB        2 g 200 mL/hr over 30 Minutes Intravenous  Once 03/20/20 0001 03/20/20 0159   04/03/2020 2230  vancomycin (VANCOREADY) IVPB 1250 mg/250 mL  Status:  Discontinued        1,250 mg 166.7 mL/hr over 90 Minutes Intravenous  Once 03/10/2020 2217 03/15/2020 2225   03/22/2020 2224  piperacillin-tazobactam (ZOSYN) IVPB 3.375 g  Status:  Discontinued  3.375 g 12.5 mL/hr over 240 Minutes Intravenous Every 8 hours 04/05/2020 2217 03/20/20 0000       Subjective: Patient seen and examined at bedside.  Feels weak.  Complains of pain all over.  Poor historian.  No overnight fever, vomiting reported.  Objective: Vitals:   03/29/20 2025 03/29/20 2359 03/30/20 0401 03/30/20 0800  BP: 123/67 119/73 131/72 108/64  Pulse: (!) 109 (!) 101 100   Resp: (!) 28 (!) _0 Temp: 99.4 F (37.4 C) 99.7 F (37.6 C) 98.2 F (36.8 C) 98.3 F (36.8 C)  TempSrc: Oral Oral Oral Oral  SpO2: 96% 93% 94% 96%  Weight:   63.5 kg     Intake/Output Summary (Last 24 hours) at 03/30/2020 1156 Last data filed at 03/30/2020 0800 Gross per 24 hour  Intake 360 ml  Output 3850 ml  Net  -3490 ml   Filed Weights   03/27/20 0419 03/28/20 2218 03/30/20 0401  Weight: 69.2 kg 69.1 kg 63.5 kg    Examination:  General exam: Appears calm and comfortable.  Poor historian.  Pale looking.  Chronically ill looking. Respiratory system: Bilateral decreased breath sounds at bases with scattered crackles Cardiovascular system: S1 & S2 heard, intermittently tachycardic Gastrointestinal system: Abdomen is nondistended, soft and nontender. Normal bowel sounds heard. Extremities: No cyanosis, clubbing; trace lower extremity edema Central nervous system: Alert and oriented.  Poor historian.  No focal neurological deficits. Moving extremities Skin: No rashes, lesions or ulcers Psychiatry: Flat affect    Data Reviewed: I have personally reviewed following labs and imaging studies  CBC: Recent Labs  Lab 03/24/20 1018 03/24/20 1018 03/28/2020 0704 03/30/2020 0704 03/26/20 0254 03/26/20 0254 03/27/20 1537 03/28/20 0355 03/28/20 1538 03/29/20 0040 03/29/20 0816  WBC 5.1   < > 4.9   < > 4.0  --  4.3  --  5.5 4.9 4.1  NEUTROABS 4.6  --  4.0  --  3.3  --  4.0  --   --   --   --   HGB 8.9*   < > 8.1*   < > 7.1*   < > 6.2* 7.7* 8.1* 7.1* 7.1*  HCT 25.7*   < > 23.8*   < > 21.4*   < > 18.1* 22.7* 23.4* 20.8* 20.5*  MCV 85.1   < > 86.5   < > 86.3  --  87.9  --  85.7 86.0 87.2  PLT 50*   < > 40*   < > 35*  --  25*  --  38* 45* 123*   < > = values in this interval not displayed.   Basic Metabolic Panel: Recent Labs  Lab 03/27/2020 0704 03/26/20 0254 03/27/20 0139 03/28/20 0852 03/29/20 0040  NA 127* 127* 124* 126* 129*  K 3.7 3.8 3.6 3.1* 3.7  CL 100 100 96* 93* 95*  CO2 19* 17* 21* 25 26  GLUCOSE 205* 192* 138* 146* 173*  BUN 41* 35* 21* 16 19  CREATININE 1.23 0.88 0.63 0.62 0.79  CALCIUM 8.8* 9.1 8.8* 8.7* 9.0   GFR: Estimated Creatinine Clearance: 83 mL/min (by C-G formula based on SCr of 0.79 mg/dL). Liver Function Tests: Recent Labs  Lab 03/24/20 1018 04/01/2020 0704  03/26/20 0254 03/27/20 0139 03/29/20 0040  AST _1 38  ALT _2 ALKPHOS 78 79 70 73 78  BILITOT 0.4 0.5 0.4 0.6 0.2*  PROT 5.7* 5.6* 5.2* 5.2* 5.1*  ALBUMIN 1.6* 1.6* 1.5*  1.5* 1.6*   No results for input(s): LIPASE, AMYLASE in the last 168 hours. No results for input(s): AMMONIA in the last 168 hours. Coagulation Profile: Recent Labs  Lab 04-18-20 0040  INR 1.2   Cardiac Enzymes: No results for input(s): CKTOTAL, CKMB, CKMBINDEX, TROPONINI in the last 168 hours. BNP (last 3 results) No results for input(s): PROBNP in the last 8760 hours. HbA1C: No results for input(s): HGBA1C in the last 72 hours. CBG: Recent Labs  Lab 04-18-20 0752 04/18/2020 1140 04-18-2020 1652 04/18/20 2221 03/30/20 0756  GLUCAP 134* 109* 176* 127* 155*   Lipid Profile: No results for input(s): CHOL, HDL, LDLCALC, TRIG, CHOLHDL, LDLDIRECT in the last 72 hours. Thyroid Function Tests: No results for input(s): TSH, T4TOTAL, FREET4, T3FREE, THYROIDAB in the last 72 hours. Anemia Panel: No results for input(s): VITAMINB12, FOLATE, FERRITIN, TIBC, IRON, RETICCTPCT in the last 72 hours. Sepsis Labs: No results for input(s): PROCALCITON, LATICACIDVEN in the last 168 hours.  Recent Results (from the past 240 hour(s))  Culture, blood (Routine X 2) w Reflex to ID Panel     Status: None   Collection Time: 03/21/20  1:59 PM   Specimen: BLOOD RIGHT HAND  Result Value Ref Range Status   Specimen Description BLOOD RIGHT HAND  Final   Special Requests   Final    BOTTLES DRAWN AEROBIC AND ANAEROBIC Blood Culture results may not be optimal due to an excessive volume of blood received in culture bottles   Culture   Final    NO GROWTH 5 DAYS Performed at [facility], [place] [address], [place], [place] [address]    Report Status 03/26/2020 FINAL  Final  Culture, blood (Routine X 2) w Reflex to ID Panel     Status: None   Collection Time: 03/21/20  1:59 PM   Specimen: BLOOD LEFT HAND    Result Value Ref Range Status   Specimen Description BLOOD LEFT HAND  Final   Special Requests   Final    BOTTLES DRAWN AEROBIC AND ANAEROBIC Blood Culture results may not be optimal due to an excessive volume of blood received in culture bottles   Culture   Final    NO GROWTH 5 DAYS Performed at [facility], [place] [address], [place], [place] [address]    Report Status 03/26/2020 FINAL  Final         Radiology Studies: CT BIOPSY  Result Date: April 18, 2020 INDICATION: 54 year old male with history of multiple myeloma and left anterior chest wall mass. EXAM: CT BIOPSY COMPARISON:  Chest CT from 03/27/2020 MEDICATIONS: None. ANESTHESIA/SEDATION: Fentanyl 25 mcg IV; Versed 0.5 mg IV Sedation time: 18 minutes; The patient was continuously monitored during the procedure by the interventional radiology nurse under my direct supervision. CONTRAST:  None. COMPLICATIONS: None immediate. PROCEDURE: Informed consent was obtained from the patient following an explanation of the procedure, risks, benefits and alternatives. A time out was performed prior to the initiation of the procedure. The patient was positioned supine on the CT table and a limited CT was performed for procedural planning demonstrating similar appearing left anterior chest wall soft tissue mass invading the anterior second and third ribs measuring approximately 5.0 x 3.1 cm in axial dimension. The procedure was planned. The operative site was prepped and draped in the usual sterile fashion. Appropriate trajectory was confirmed with a 22 gauge spinal needle after the adjacent tissues were anesthetized with 1% Lidocaine with epinephrine. Under intermittent CT guidance, a 17 gauge coaxial  needle was advanced into the peripheral aspect of the mass. Appropriate positioning was confirmed and a total of 5 samples were obtained with an 18 gauge core needle biopsy device. The co-axial needle was removed while injecting Gel-Foam slurry and  hemostasis was achieved with manual compression. A limited postprocedural CT was negative for hemorrhage or additional complication. A dressing was placed. The patient tolerated the procedure well without immediate postprocedural complication. IMPRESSION: Technically successful CT guided core needle biopsy of left anterior chest wall mass. Electronically Signed   By: Ruthann Cancer MD   On: 03/29/2020 10:33   DG CHEST PORT 1 VIEW  Result Date: 03/28/2020 CLINICAL DATA:  Tachypnea EXAM: PORTABLE CHEST 1 VIEW COMPARISON:  03/27/2020 CT FINDINGS: Cardiac shadow is stable. Diffuse bilateral infiltrates are again seen similar to that noted on prior chest CT. No sizable effusion is noted. No pneumothorax is noted. Lytic lesions are noted throughout the bony skeleton particularly in the midthoracic spine similar to that seen on recent CT examination. IMPRESSION: Patchy bilateral infiltrate stable from the prior exam. Stable lytic lesions within the bony structures. Electronically Signed   By: Inez Catalina M.D.   On: 03/28/2020 23:04        Scheduled Meds: . (feeding supplement) PROSource Plus  30 mL Oral BID BM  . sodium chloride   Intravenous Once  . sodium chloride   Intravenous Once  . acyclovir  400 mg Oral Daily  . carvedilol  3.125 mg Oral BID WC  . dexamethasone (DECADRON) injection  2 mg Intravenous QODAY  . insulin aspart  0-9 Units Subcutaneous TID WC  . insulin aspart  3 Units Subcutaneous TID WC  . insulin glargine  15 Units Subcutaneous QHS  . lactulose  20 g Oral TID  . levETIRAcetam  500 mg Oral BID  . lidocaine  2 patch Transdermal Q24H  . melatonin  3 mg Oral QHS  . midodrine  5 mg Oral BID WC  . pantoprazole  40 mg Oral BID AC  . Ensure Max Protein  11 oz Oral BID BM  . QUEtiapine  12.5 mg Oral QHS  . senna-docusate  2 tablet Oral BID  . sodium chloride  2 spray Each Nare Q1H while awake  . sucralfate  1 g Oral TID WC & HS   Continuous Infusions: . sodium chloride Stopped  (03/28/20 0000)  .  ceFAZolin (ANCEF) IV 2 g (03/30/20 0602)          Aline August, MD [facility] 03/30/2020, 11:56 AM

## 2020-03-30 NOTE — Progress Notes (Signed)
Occupational Therapy Treatment Patient Details Name: Trevor Shelton MRN: [id number] DOB: 11/11/1965 Today's Date: 03/30/2020    History of present illness 54 yo male with PMH of HTN and HLD.  Pt with recent admission 02/14/20-03/03/20 with neoplasm consistent with multiple myeloma with multiple spinal lesions, L rib fx 9 and 10, and cord compression.  Pt on Decadron for cord compression.  Pt was at inpt rehab and developed nose bleed and fever. Has been admitted with fever, multiple myeloma, AMS, AKI, and pancytopenia.  Pt found to have small intracrainal hemorrhage and severe sepsis with PNA 9/14 rapid response called transfer to ICU sleep cycle disturbed with visaul hallunications TEE cancelled EEG moderate diffuse encephalopathy   OT comments  Pt received sitting up reclined in bed with complaints of back, chest and shoulder pain, however, agreeable to sit EOB for sitting tolerance with ADLs and functional transfer to chair simulated toilet transfer. Max A +2 from supine to sit with min to mod A for stability in sitting EOB for functional task, pt observed to require BUE support. Total A stand pivot from bed to chair (simulated toilet transfer) with BLE blocking. Pt limited with full engagement due to fatigue and SOB, HR approximately at 110 and BP values of 124/73 and 112/78. Continue acute OT to address established deficits and to improve strength for ADL engagement and increased independence.    Follow Up Recommendations  SNF    Equipment Recommendations  3 in 1 bedside commode;Wheelchair (measurements OT);Wheelchair cushion (measurements OT);Hospital bed;Other (comment) (hoyer lift - palliative consult)    Recommendations for Other Services Other (comment) (Palliative)    Precautions / Restrictions Precautions Precautions: Fall Precaution Comments: Significant BLE weakness and ataxia-like movements; back and rib precautions for comfort due to fxs Restrictions Weight Bearing Restrictions: No        Mobility Bed Mobility Overal bed mobility: Needs Assistance Bed Mobility: Rolling;Sidelying to Sit Rolling: Max assist Sidelying to sit: +2 for physical assistance;Max assist Supine to sit: +2 for physical assistance;Max assist     General bed mobility comments: Pt able to present RLE to EOB however, decreased control and Max A of LLE with cueing for hand bar to assist with trunk elevation.   Transfers Overall transfer level: Needs assistance Equipment used: 2 person hand held assist Transfers: Sit to/from [facility] Sit to Stand: +2 physical assistance;Total assist Stand pivot transfers: +2 physical assistance;Total assist       General transfer comment: requrires total (A) with blocking bil LE; unable to sustain extension of hips and kness and sat close to edge of chair needing assist to scoot back    Balance Overall balance assessment: Needs assistance Sitting-balance support: Bilateral upper extremity supported;Feet supported Sitting balance-Leahy Scale: Poor Sitting balance - Comments: required support anterior and some posteriorly while seated EOB.   Standing balance support: Bilateral upper extremity supported Standing balance-Leahy Scale: Zero Standing balance comment: Total A of 2 for stand pivot to chair                           ADL either performed or assessed with clinical judgement   ADL Overall ADL's : Needs assistance/impaired                         Toilet Transfer: +2 for physical assistance;Total assistance Toilet Transfer Details (indicate cue type and reason): simulated toilet transfer from bed to chair with Total A, bed elevated  and BLE blocking for stand pivot transfer to drop arm recliner. Toileting- Clothing Manipulation and Hygiene: Total assistance;Sit to/from stand       Functional mobility during ADLs: +2 for physical assistance;Total assistance General ADL Comments: Pt easily fatigued with  transfer, however, motivated to continue  sitting up in chair to engage with wife at bedside, will follow up to address time limit in chair.     Vision   Vision Assessment?: No apparent visual deficits   Perception     Praxis      Cognition Arousal/Alertness: Awake/alert Behavior During Therapy: WFL for tasks assessed/performed Overall Cognitive Status: Impaired/Different from baseline Area of Impairment: Attention (Simultaneous filing. User may not have seen previous data.  Simultaneous filing. User may not have seen previous data.)                   Current Attention Level: Alternating (Simultaneous filing. User may not have seen previous data.)   Following Commands: Follows multi-step commands with increased time       General Comments: Interpreter utilized Trevor Shelton - [phone]        Exercises General Exercises - Lower Extremity Gluteal Sets:  (5-sec holds)   Shoulder Instructions       General Comments Bp values of 124/73, 112/78 with 110 bpm at the max    Pertinent Vitals/ Pain       Pain Assessment: 0-10 Pain Score: 6  Pain Location: back, chest, and shoulders Pain Descriptors / Indicators: Discomfort;Grimacing Pain Intervention(s): Monitored during session;Repositioned  Home Living                                          Prior Functioning/Environment              Frequency  Min 2X/week        Progress Toward Goals  OT Goals(current goals can now be found in the care plan section)  Progress towards OT goals: Progressing toward goals (slowly progressing. )  Acute Rehab OT Goals Patient Stated Goal: to get to chair  OT Goal Formulation: With patient Time For Goal Achievement: 04/08/20 Potential to Achieve Goals: Fair ADL Goals Pt Will Perform Grooming: with supervision;sitting Pt Will Perform Upper Body Bathing: with supervision;sitting Pt Will Perform Lower Body Bathing: with min assist;with adaptive  equipment;sit to/from stand Pt Will Perform Upper Body Dressing: with min guard assist;sitting Pt Will Transfer to Toilet: with min assist;stand pivot transfer;bedside commode Pt/caregiver will Perform Home Exercise Program: Increased strength;Both right and left upper extremity;With theraband;With Supervision Additional ADL Goal #1: pt will complete bed mobility supervision level with back precautions  Plan Discharge plan remains appropriate    Co-evaluation    PT/OT/SLP Co-Evaluation/Treatment: Yes Reason for Co-Treatment: For patient/therapist safety;Complexity of the patient's impairments (multi-system involvement) PT goals addressed during session: Mobility/safety with mobility OT goals addressed during session: ADL's and self-care;Strengthening/ROM      AM-PAC OT "6 Clicks" Daily Activity     Outcome Measure   Help from another person eating meals?: A Little Help from another person taking care of personal grooming?: A Lot Help from another person toileting, which includes using toliet, bedpan, or urinal?: A Lot Help from another person bathing (including washing, rinsing, drying)?: A Lot Help from another person to put on and taking off regular upper body clothing?: A Lot Help from another person to put on and  taking off regular lower body clothing?: Total 6 Click Score: 12    End of Session Equipment Utilized During Treatment: Gait belt;Oxygen  OT Visit Diagnosis: Unsteadiness on feet (R26.81);Other abnormalities of gait and mobility (R26.89);Other symptoms and signs involving the nervous system (R29.898);Muscle weakness (generalized) (M62.81);Pain;Feeding difficulties (R63.3);Other symptoms and signs involving cognitive function Pain - part of body:  (back, ribs)   Activity Tolerance Patient limited by pain   Patient Left in chair;with call bell/phone within reach;with chair alarm set;with family/visitor present   Nurse Communication Mobility status;Precautions         Time: 1055-1130 OT Time Calculation (min): 35 min  Charges: OT General Charges $OT Visit: 1 Visit OT Treatments $Self Care/Home Management : 8-22 mins  Minus Breeding, MSOT, OTR/L  Supplemental Rehabilitation Services  [phone]    Marius Ditch 03/30/2020, 12:07 PM

## 2020-03-30 NOTE — Progress Notes (Signed)
Physical Therapy Treatment Patient Details Name: Trevor Shelton MRN: [id number] DOB: [birth year] Today's Date: 03/30/2020    History of Present Illness 54 yo male with PMH of HTN and HLD.  Pt with recent admission 02/14/20-03/03/20 with neoplasm consistent with multiple myeloma with multiple spinal lesions, L rib fx 9 and 10, and cord compression.  Pt on Decadron for cord compression.  Pt was at inpt rehab and developed nose bleed and fever. Has been admitted with fever, multiple myeloma, AMS, AKI, and pancytopenia.  Pt found to have small intracrainal hemorrhage and severe sepsis with PNA 9/14 rapid response called transfer to ICU sleep cycle disturbed with visaul hallunications TEE cancelled EEG moderate diffuse encephalopathy    PT Comments    Pt supine in bed agreeable to working with therapy. Reports that he has pain in bilateral shoulders and chest as well as B LE. Spoke through iPad interpreter, and not sure whether language barrier or poor cognition, however pt does not have a good awareness of his current condition and rehab potential. Pt would benefit from Palliative involvement for management of complex medical issues. Pt requires maxAx2 for bed mobility, max progressing to min guard for sitting balance and total A for pivot transfer to recliner. D/c plans remain appropriate at this time. PT will continue to follow acutely.    Follow Up Recommendations  SNF     Equipment Recommendations  Wheelchair (measurements PT);Wheelchair cushion (measurements PT);3in1 (PT)       Precautions / Restrictions Precautions Precautions: Fall Precaution Comments: Significant BLE weakness and ataxia-like movements; back and rib precautions for comfort due to fxs Restrictions Weight Bearing Restrictions: No    Mobility  Bed Mobility Overal bed mobility: Needs Assistance Bed Mobility: Rolling;Sidelying to Sit Rolling: Max assist Sidelying to sit: +2 for physical assistance;Max assist Supine to sit: +2  for physical assistance;Max assist     General bed mobility comments: Pt able to present RLE to EOB however, decreased control and Max A of LLE with cueing for hand bar to assist with trunk elevation.   Transfers Overall transfer level: Needs assistance Equipment used: 2 person hand held assist Transfers: Sit to/from [facility] Sit to Stand: +2 physical assistance;Total assist Stand pivot transfers: +2 physical assistance;Total assist       General transfer comment: requrires total (A) with blocking bil LE; unable to sustain extension of hips and kness and sat close to edge of chair needing assist to scoot back        Balance Overall balance assessment: Needs assistance Sitting-balance support: Bilateral upper extremity supported;Feet supported Sitting balance-Leahy Scale: Poor Sitting balance - Comments: required support anterior and some posteriorly while seated EOB.   Standing balance support: Bilateral upper extremity supported Standing balance-Leahy Scale: Zero Standing balance comment: Total A of 2 for stand pivot to chair                            Cognition Arousal/Alertness: Awake/alert Behavior During Therapy: WFL for tasks assessed/performed Overall Cognitive Status: Impaired/Different from baseline Area of Impairment: Attention ( )                   Current Attention Level: Alternating   Following Commands: Follows multi-step commands with increased time       General Comments: Pt with decreased understanding of his condition and prognosis. Interpreter utilized [name] [phone]      Exercises General Exercises - Lower Extremity Ankle  Circles/Pumps: AROM;5 reps;Right;AAROM;Left;Seated Gluteal Sets:  (5-sec holds) Heel Slides: Both;5 reps;Seated Heel Slides Limitations: AAROM on L     General Comments General comments (skin integrity, edema, etc.): Bp values of 124/73, 112/78 with 110 bpm at the max       Pertinent Vitals/Pain Pain Assessment: 0-10 Pain Score: 6  Pain Location: back, chest, and shoulders Pain Descriptors / Indicators: Discomfort;Grimacing Pain Intervention(s): Monitored during session;Repositioned;Limited activity within patient's tolerance     PT Goals (current goals can now be found in the care plan section) Acute Rehab PT Goals Patient Stated Goal: to get to chair  PT Goal Formulation: With patient/family Time For Goal Achievement: 04/04/20 Potential to Achieve Goals: Fair Progress towards PT goals: Progressing toward goals (very slowly, limited by pain )    Frequency    Min 2X/week      PT Plan Discharge plan needs to be updated;Frequency needs to be updated    Co-evaluation PT/OT/SLP Co-Evaluation/Treatment: Yes Reason for Co-Treatment: For patient/therapist safety PT goals addressed during session: Mobility/safety with mobility OT goals addressed during session: ADL's and self-care      AM-PAC PT "6 Clicks" Mobility   Outcome Measure  Help needed turning from your back to your side while in a flat bed without using bedrails?: A Lot Help needed moving from lying on your back to sitting on the side of a flat bed without using bedrails?: A Lot Help needed moving to and from a bed to a chair (including a wheelchair)?: Total Help needed standing up from a chair using your arms (e.g., wheelchair or bedside chair)?: Total Help needed to walk in hospital room?: Total Help needed climbing 3-5 steps with a railing? : Total 6 Click Score: 8    End of Session Equipment Utilized During Treatment: Gait belt Activity Tolerance: Patient limited by pain Patient left: in chair;with call bell/phone within reach;with chair alarm set;with family/visitor present Nurse Communication: Mobility status;Need for lift equipment (pt can only tolerate sitting approx 1 hour) PT Visit Diagnosis: Muscle weakness (generalized) (M62.81);Difficulty in walking, not elsewhere  classified (R26.2);Other abnormalities of gait and mobility (R26.89) Pain - part of body: Shoulder;Leg (back)     Time: 1056-1130 PT Time Calculation (min) (ACUTE ONLY): 34 min  Charges:  $Therapeutic Activity: 8-22 mins                     Priscilla Kirstein B. Migdalia Dk PT, DPT Acute Rehabilitation Services Pager [phone] Office [phone]    [place] 03/30/2020, 1:05 PM

## 2020-03-30 NOTE — Progress Notes (Signed)
Patient stating he feels weak this morning and asking if I could give him any medication that would give him strength. He denies any pain and shortness of breath. Paged [facility], to get morning lab orders like CBC. Vitals 114/73 HR 100, 97% RA, 21 RR, 98.0 F. Will continue to monitor closely

## 2020-03-30 NOTE — TOC Progression Note (Signed)
Transition of Care [facility]) - Progression Note    Patient Details  Name: Bienvenido Proehl MRN: [id number] Date of Birth: 1966-06-28  Transition of Care [facility]) CM/SW [place], [place] Phone Number: 03/30/2020, 4:35 PM  Clinical Narrative:     CSW spoke with patient at bedside. CSW let patient know that he has no SNF bed offers at this time. CSW let patient know that his medicaid is pending. CSW let patient know that she will continue to follow and if anything changes with bed offers CSW will let patient know. No further questions reported at this time. CSW to continue to follow and assist with discharge planning needs.   Expected Discharge Plan: [place] Barriers to Discharge: Continued Medical Work up  Expected Discharge Plan and Services Expected Discharge Plan: [place] arrangements for the past 2 months: Single Family Home                                       Social Determinants of Health (SDOH) Interventions    Readmission Risk Interventions Readmission Risk Prevention Plan 02/29/2020  Transportation Screening Complete  Medication Review Press photographer) Complete  Some recent data might be hidden

## 2020-03-31 DIAGNOSIS — G9341 Metabolic encephalopathy: Secondary | ICD-10-CM

## 2020-03-31 DIAGNOSIS — G8222 Paraplegia, incomplete: Secondary | ICD-10-CM

## 2020-03-31 DIAGNOSIS — Z66 Do not resuscitate: Secondary | ICD-10-CM

## 2020-03-31 DIAGNOSIS — R627 Adult failure to thrive: Secondary | ICD-10-CM

## 2020-03-31 DIAGNOSIS — Z515 Encounter for palliative care: Secondary | ICD-10-CM

## 2020-03-31 DIAGNOSIS — R0602 Shortness of breath: Secondary | ICD-10-CM

## 2020-03-31 LAB — MAGNESIUM: Magnesium: 1.3 mg/dL — ABNORMAL LOW (ref 1.7–2.4)

## 2020-03-31 LAB — BASIC METABOLIC PANEL
Anion gap: 11 (ref 5–15)
BUN: 19 mg/dL (ref 6–20)
CO2: 25 mmol/L (ref 22–32)
Calcium: 9.1 mg/dL (ref 8.9–10.3)
Chloride: 90 mmol/L — ABNORMAL LOW (ref 98–111)
Creatinine, Ser: 0.63 mg/dL (ref 0.61–1.24)
GFR calc Af Amer: >60 mL/min (ref >60)
GFR calc non Af Amer: >60 mL/min (ref >60)
Glucose, Bld: 134 mg/dL — ABNORMAL HIGH (ref 70–99)
Potassium: 3.7 mmol/L (ref 3.5–5.1)
Sodium: 126 mmol/L — ABNORMAL LOW (ref 135–145)

## 2020-03-31 LAB — GLUCOSE, CAPILLARY
Glucose-Capillary: 142 mg/dL — ABNORMAL HIGH (ref 70–99)
Glucose-Capillary: 234 mg/dL — ABNORMAL HIGH (ref 70–99)
Glucose-Capillary: 226 mg/dL — ABNORMAL HIGH (ref 70–99)
Glucose-Capillary: 176 mg/dL — ABNORMAL HIGH (ref 70–99)

## 2020-03-31 LAB — CBC WITH DIFFERENTIAL/PLATELET
Abs Immature Granulocytes: 0.26 10*3/uL — ABNORMAL HIGH (ref 0.00–0.07)
Basophils Absolute: 0 10*3/uL (ref 0.0–0.1)
Basophils Relative: 0 %
Eosinophils Absolute: 0 10*3/uL (ref 0.0–0.5)
Eosinophils Relative: 0 %
HCT: 22.4 % — ABNORMAL LOW (ref 39.0–52.0)
Hemoglobin: 7.4 g/dL — ABNORMAL LOW (ref 13.0–17.0)
Immature Granulocytes: 7 %
Lymphocytes Relative: 10 %
Lymphs Abs: 0.4 10*3/uL — ABNORMAL LOW (ref 0.7–4.0)
MCH: 29.4 pg (ref 26.0–34.0)
MCHC: 33 g/dL (ref 30.0–36.0)
MCV: 88.9 fL (ref 80.0–100.0)
Monocytes Absolute: 0.3 10*3/uL (ref 0.1–1.0)
Monocytes Relative: 7 %
Neutro Abs: 3 10*3/uL (ref 1.7–7.7)
Neutrophils Relative %: 76 %
Platelets: 114 10*3/uL — ABNORMAL LOW (ref 150–400)
RBC: 2.52 MIL/uL — ABNORMAL LOW (ref 4.22–5.81)
RDW: 13.7 % (ref 11.5–15.5)
WBC: 3.9 10*3/uL — ABNORMAL LOW (ref 4.0–10.5)
nRBC: 0.5 % — ABNORMAL HIGH (ref 0.0–0.2)

## 2020-03-31 LAB — SURGICAL PATHOLOGY

## 2020-03-31 MED ORDER — HYDROMORPHONE HCL 1 MG/ML IJ SOLN
0.5000 mg | Freq: Once | INTRAMUSCULAR | Status: AC
  Administered 2020-03-31: 0.5 mg via INTRAVENOUS
  Filled 2020-03-31: qty 1

## 2020-03-31 MED ORDER — ACETAMINOPHEN 325 MG PO TABS
650.0000 mg | ORAL_TABLET | Freq: Four times a day (QID) | ORAL | Status: DC
  Administered 2020-03-31 – 2020-04-02 (×8): 650 mg via ORAL
  Filled 2020-03-31 (×8): qty 2

## 2020-03-31 MED ORDER — MIDODRINE HCL 5 MG PO TABS
2.5000 mg | ORAL_TABLET | Freq: Two times a day (BID) | ORAL | Status: DC
  Administered 2020-03-31 – 2020-04-01 (×2): 2.5 mg via ORAL
  Filled 2020-03-31 (×2): qty 1

## 2020-03-31 MED ORDER — FUROSEMIDE 10 MG/ML IJ SOLN
40.0000 mg | Freq: Two times a day (BID) | INTRAMUSCULAR | Status: DC
  Administered 2020-03-31 – 2020-04-02 (×5): 40 mg via INTRAVENOUS
  Filled 2020-03-31 (×5): qty 4

## 2020-03-31 MED ORDER — MAGNESIUM SULFATE 2 GM/50ML IV SOLN
2.0000 g | Freq: Once | INTRAVENOUS | Status: AC
  Administered 2020-03-31: 2 g via INTRAVENOUS
  Filled 2020-03-31: qty 50

## 2020-03-31 NOTE — Progress Notes (Signed)
Patient had run of SVT HR up to the 150s. Upon assessment patient resting eyes closed. Will continue to monitor patient closely.

## 2020-03-31 NOTE — Progress Notes (Addendum)
Patient desated to the 80% on RA, placed patient on 3L. Patient did become tachypneic, he stated that his chest and back were hurting. Will give pain medication.   Stated that the iv morphine did not help as much as it did earlier. Paged [facility], got order for .5mg  dilaudid. Will reassess pain after administration.

## 2020-03-31 NOTE — Progress Notes (Signed)
Patient ID: Trevor Shelton, male   DOB: July 28, 1965, 54 y.o.   MRN: [id number]  PROGRESS NOTE    Aadan Chenier  YQI:[id number] DOB: [birth year] DOA: 03/29/2020 PCP: Patient, No Pcp Per   Brief Narrative:  54 year old male with very long/complex hospitalization at [facility] long hospital recently in 02/2020 with back pain and was found to have diffuse lytic lesions throughout the skeleton, epidural tumor extension with pathological fracture of T6 and T7, contrast-enhancing mass replacing most of T5 vertebral body with severe cord compression, and acute renal failure, required couple of hemodialysis sessions, kidney function improved. --Followed by medical and radiation oncology, treated with IV Decadron and has completed 10/10 doses of XRT. Work-up consistent with newly diagnosed IgG kappa MM with plans for CyBorD chemo to start following completion of XRT.  -Following transfer to rehab, he underwent chemotherapy on 03/15/2020 and next chemotherapy was scheduled for 03/22/2020.Patient had significant nosebleed on 03/17/2020. -Subsequently spiked fevers with T-max 102 -blood culture grew MSSA bacteremia, started on IV Ancef seen by ID as well for this -During this hospitalization; he was also noted to have a 5 x 2 cm left upper lobe chest wall mass on CT. -On 03/21/2020: He was found to be lethargic/less responsive.  Seen by neurology: Suspicious for seizure activity.  Started on Keppra.  EEG negative.  MRI noted to have acute intracranial hemorrhage in the right frontal convexity, followed by neurology for this.  Mental status slowly improving. -Patient has been complaining of chest pain throughout at the site of his chest wall mass.  CTA negative for PE [facility] course complicated by ileus and pleural effusion from third spacing, now improving -03/28/2020: Received platelet transfusion.  03/29/2020: CT-guided biopsy of left anterior chest wall mass  Assessment & Plan:   Acute intracranial hemorrhage -On 9/12  patient was noted to be disoriented, CT head noted right frontal lesion concerning for hemorrhage , MRI noted acute hemorrhage involving anterior-inferior right frontal convexity, this is felt to be secondary to severe thrombocytopenia from effect of multiple myeloma and chemotherapy.  -Repeat CT head decreased size of hemorrhage -Appreciate neurology input, recommended baby aspirin when platelet count is higher and cleared by oncology -transfuse plst if <10K or active bleeding -Mental status has improved and stabilized  Severe sepsis MSSA bacteremia -Currently sepsis has resolved.  Repeat blood cultures have been negative.  2D echo was unremarkable for vegetations.  TEE deferred especially in the setting of severe thrombocytopenia and complicated hospitalization -Currently on IV Ancef.  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  -Rx w/ velcade and dexamethasone on 9/7, acyclovir, diflucan. -Oncology following, further chemotherapy on hold with acute infection -Remains on Decadron every other day and prophylactic acyclovir -Await oncology reevaluation -Palliative care evaluation for goals of care discussion  Pancytopenia/anemia/severe thrombocytopenia -Status post multiple units of packed red cells transfusion and platelet transfusion during this hospitalization -Monitor.  Transfuse PRBC if hemoglobin less than 7 and platelets less than 10K or active bleeding -Hemoglobin 7.9 and platelets 141 on 03/30/2020.  Labs pending for today.  Hyponatremia -Questionable cause.  Sodium 129 on 03/30/2020 and 126 today.  Receiving  intermittent Lasix IV. Will start scheduled intravenous Lasix.  Left upper lobe chest wall mass 5 x 2 cm -Had CT guided biopsy in IR on 03/29/2020: Await pathology  Acute encephalopathy multifactorial -Became lethargic and more confused on 03/20/2020.  There was a question of seizure.  He was transferred to neuro ICU on 03/22/2020 and started on Keppra.  EEG showed moderate encephalopathy with no epileptiform discharges. -Currently mental status stable.  Neurology has signed off.  Continue Keppra.  Epistaxis -Resolved.  Continue Afrin as needed  Chest pain/diffuse body pain -Secondary to diffuse bony involvement and chest wall mass.  EKG unremarkable.  Troponins negative.  CTA negative for PE.  Continue narcotics as needed  Pleural effusions Third spacing: -Diuresed with Lasix intermittently  AKI/Short term HD in august 2021:  -Renal function improved and stable  T2DM -newly diagnosed in august 2021. a1c 6.5.  -CBGs fairly stable now continue Lantus and sliding scale insulin  Chronic constipation Ileus -Continue laxatives  HTN/HLD -bp meds amlodipine on hold, on coreg low dose and is on Midodrine -BP more stable now -Wean off midodrine as tolerated  Severe hypoalbuminemia -Could be secondary to underlying malignancy and complicated hospitalization -Follow nutrition recommendations  Weakness/debility/physical deconditioning:  Lower extremity weakness from spinal cord involvement -Will eventually need SNF placement.  Social work following -Palliative care consultation for goals of care discussion.  Touched base with Dr. Lorenso Courier via secure chat on 03/30/2020 and he is also waiting for pathology report before making further recommendations and having goals of care discussion.  DVT prophylaxis: SCDs Code Status: DNR Family Communication: Wife at bedside on 03/28/2020. Used video interpreter for communication. Disposition Plan: Status is: Inpatient  Remains inpatient  appropriate because:Inpatient level of care appropriate due to severity of illness   Dispo: The patient is from: Home              Anticipated d/c is to: SNF              Anticipated d/c date is: > 3 days              Patient currently is not medically stable to d/c.  Consultants: Neurology/oncology/IR  Procedures: Chest wall mass biopsy on 03/29/2020  Antimicrobials:  Anti-infectives (From admission, onward)   Start     Dose/Rate Route Frequency Ordered Stop   03/20/20 1600  ceFAZolin (ANCEF) IVPB 2g/100 mL premix        2 g 200 mL/hr over 30 Minutes Intravenous Every 8 hours 03/20/20 1505     03/20/20 1000  acyclovir (ZOVIRAX) 200 MG capsule 400 mg        400 mg Oral Daily 04/03/2020 2217     03/20/20 1000  fluconazole (DIFLUCAN) tablet 100 mg        100 mg Oral Daily 03/31/2020 2217 03/23/20 0959   03/20/20 0800  vancomycin (VANCOREADY) IVPB 750 mg/150 mL  Status:  Discontinued        750 mg 150 mL/hr over 60 Minutes Intravenous Every 12 hours 03/29/2020 2217 03/20/20 1452   03/20/20 0800  ceFEPIme (MAXIPIME) 2 g in sodium chloride 0.9 % 100 mL IVPB  Status:  Discontinued        2 g 200 mL/hr over 30 Minutes Intravenous Every 8 hours 03/20/20 0001 03/20/20 1452   03/20/20 0015  ceFEPIme (MAXIPIME) 2 g in sodium chloride 0.9 % 100 mL IVPB        2 g 200 mL/hr over 30 Minutes Intravenous  Once 03/20/20 0001 03/20/20 0159   04/03/2020 2230  vancomycin (VANCOREADY)  IVPB 1250 mg/250 mL  Status:  Discontinued        1,250 mg 166.7 mL/hr over 90 Minutes Intravenous  Once 04/07/2020 2217 03/10/2020 2225   03/27/2020 2224  piperacillin-tazobactam (ZOSYN) IVPB 3.375 g  Status:  Discontinued        3.375 g 12.5 mL/hr over 240 Minutes Intravenous Every 8 hours 03/31/2020 2217 03/20/20 0000       Subjective: Patient seen and examined at bedside.  Still complains of intermittent chest pain and shortness breath.  No overnight fever, vomiting, abdominal pain reported.  Objective: Vitals:   03/30/20  1301 03/30/20 2017 03/31/20 0033 03/31/20 0407  BP: 118/75 131/70 124/79 114/71  Pulse: (!) 108 (!) 106 (!) 107 99  Resp: (!) 23 (!) 25 (!) 23 (!) 27  Temp: 100.1 F (37.8 C) 98.5 F (36.9 C) 97.6 F (36.4 C) 97.9 F (36.6 C)  TempSrc: Oral Oral Oral Oral  SpO2: 98% 96% 95% 100%  Weight:    59.6 kg    Intake/Output Summary (Last 24 hours) at 03/31/2020 0738 Last data filed at 03/30/2020 2000 Gross per 24 hour  Intake 720 ml  Output 1000 ml  Net -280 ml   Filed Weights   03/28/20 2218 03/30/20 0401 03/31/20 0407  Weight: 69.1 kg 63.5 kg 59.6 kg    Examination:  General exam: No acute distress.  Poor historian.  Pale looking.  Chronically ill looking. Respiratory system: Bilateral decreased breath sounds at bases with some crackles.  Tachypneic Cardiovascular system: S1 & S2 heard, tachycardic intermittently Gastrointestinal system: Abdomen is nondistended, soft and nontender.  Bowel sounds are heard  extremities: Mild bilateral lower extremity edema present.  No clubbing or cyanosis Central nervous system: Alert and awake.  Poor historian.  No focal neurological deficits.  Moves extremities Skin: No obvious ecchymosis/ulcers Psychiatry: Affect is flat    Data Reviewed: I have personally reviewed following labs and imaging studies  CBC: Recent Labs  Lab 03/24/20 1018 03/24/20 1018 03/11/2020 0704 04/04/2020 0704 03/26/20 0254 03/26/20 0254 03/27/20 1537 03/27/20 1537 03/28/20 0355 03/28/20 1538 03/29/20 0040 03/29/20 0816 03/30/20 1400  WBC 5.1   < > 4.9   < > 4.0   < > 4.3  --   --  5.5 4.9 4.1 4.3  NEUTROABS 4.6  --  4.0  --  3.3  --  4.0  --   --   --   --   --   --   HGB 8.9*   < > 8.1*   < > 7.1*   < > 6.2*   < > 7.7* 8.1* 7.1* 7.1* 7.9*  HCT 25.7*   < > 23.8*   < > 21.4*   < > 18.1*   < > 22.7* 23.4* 20.8* 20.5* 23.2*  MCV 85.1   < > 86.5   < > 86.3   < > 87.9  --   --  85.7 86.0 87.2 88.9  PLT 50*   < > 40*   < > 35*   < > 25*  --   --  38* 45* 123* 141*     < > = values in this interval not displayed.   Basic Metabolic Panel: Recent Labs  Lab 03/26/20 0254 03/27/20 0139 03/28/20 0852 03/29/20 0040 03/30/20 1400  NA 127* 124* 126* 129* 129*  K 3.8 3.6 3.1* 3.7 4.2  CL 100 96* 93* 95* 92*  CO2 17* 21* _0 GLUCOSE 192* 138* 146* 173*  215*  BUN 35* 21* 16 19 23*  CREATININE 0.88 0.63 0.62 0.79 0.77  CALCIUM 9.1 8.8* 8.7* 9.0 9.1   GFR: Estimated Creatinine Clearance: 83 mL/min (by C-G formula based on SCr of 0.77 mg/dL). Liver Function Tests: Recent Labs  Lab 03/24/20 1018 03/29/2020 0704 03/26/20 0254 03/27/20 0139 04-03-2020 0040  AST _0 38  ALT _1 ALKPHOS 78 79 70 73 78  BILITOT 0.4 0.5 0.4 0.6 0.2*  PROT 5.7* 5.6* 5.2* 5.2* 5.1*  ALBUMIN 1.6* 1.6* 1.5* 1.5* 1.6*   No results for input(s): LIPASE, AMYLASE in the last 168 hours. No results for input(s): AMMONIA in the last 168 hours. Coagulation Profile: Recent Labs  Lab 2020/04/03 0040  INR 1.2   Cardiac Enzymes: No results for input(s): CKTOTAL, CKMB, CKMBINDEX, TROPONINI in the last 168 hours. BNP (last 3 results) No results for input(s): PROBNP in the last 8760 hours. HbA1C: No results for input(s): HGBA1C in the last 72 hours. CBG: Recent Labs  Lab April 03, 2020 2221 03/30/20 0756 03/30/20 1300 03/30/20 1637 03/30/20 2112  GLUCAP 127* 155* 220* 250* 205*   Lipid Profile: No results for input(s): CHOL, HDL, LDLCALC, TRIG, CHOLHDL, LDLDIRECT in the last 72 hours. Thyroid Function Tests: No results for input(s): TSH, T4TOTAL, FREET4, T3FREE, THYROIDAB in the last 72 hours. Anemia Panel: No results for input(s): VITAMINB12, FOLATE, FERRITIN, TIBC, IRON, RETICCTPCT in the last 72 hours. Sepsis Labs: No results for input(s): PROCALCITON, LATICACIDVEN in the last 168 hours.  Recent Results (from the past 240 hour(s))  Culture, blood (Routine X 2) w Reflex to ID Panel     Status: None   Collection Time: 03/21/20  1:59 PM   Specimen:  BLOOD RIGHT HAND  Result Value Ref Range Status   Specimen Description BLOOD RIGHT HAND  Final   Special Requests   Final    BOTTLES DRAWN AEROBIC AND ANAEROBIC Blood Culture results may not be optimal due to an excessive volume of blood received in culture bottles   Culture   Final    NO GROWTH 5 DAYS Performed at [facility], [place] [address], [place], [place] [address]    Report Status 03/26/2020 FINAL  Final  Culture, blood (Routine X 2) w Reflex to ID Panel     Status: None   Collection Time: 03/21/20  1:59 PM   Specimen: BLOOD LEFT HAND  Result Value Ref Range Status   Specimen Description BLOOD LEFT HAND  Final   Special Requests   Final    BOTTLES DRAWN AEROBIC AND ANAEROBIC Blood Culture results may not be optimal due to an excessive volume of blood received in culture bottles   Culture   Final    NO GROWTH 5 DAYS Performed at [facility], [place] [address] [place], [place] [address]    Report Status 03/26/2020 FINAL  Final         Radiology Studies: CT BIOPSY  Result Date: 04/03/2020 INDICATION: 54 year old male with history of multiple myeloma and left anterior chest wall mass. EXAM: CT BIOPSY COMPARISON:  Chest CT from 03/27/2020 MEDICATIONS: None. ANESTHESIA/SEDATION: Fentanyl 25 mcg IV; Versed 0.5 mg IV Sedation time: 18 minutes; The patient was continuously monitored during the procedure by the interventional radiology nurse under my direct supervision. CONTRAST:  None. COMPLICATIONS: None immediate. PROCEDURE: Informed consent was obtained from the patient following an explanation of the procedure, risks, benefits and alternatives. A time out was performed prior  to the initiation of the procedure. The patient was positioned supine on the CT table and a limited CT was performed for procedural planning demonstrating similar appearing left anterior chest wall soft tissue mass invading the anterior second and third ribs measuring approximately 5.0 x 3.1 cm in  axial dimension. The procedure was planned. The operative site was prepped and draped in the usual sterile fashion. Appropriate trajectory was confirmed with a 22 gauge spinal needle after the adjacent tissues were anesthetized with 1% Lidocaine with epinephrine. Under intermittent CT guidance, a 17 gauge coaxial needle was advanced into the peripheral aspect of the mass. Appropriate positioning was confirmed and a total of 5 samples were obtained with an 18 gauge core needle biopsy device. The co-axial needle was removed while injecting Gel-Foam slurry and hemostasis was achieved with manual compression. A limited postprocedural CT was negative for hemorrhage or additional complication. A dressing was placed. The patient tolerated the procedure well without immediate postprocedural complication. IMPRESSION: Technically successful CT guided core needle biopsy of left anterior chest wall mass. Electronically Signed   By: Ruthann Cancer MD   On: 03/29/2020 10:33   DG CHEST PORT 1 VIEW  Result Date: 03/30/2020 CLINICAL DATA:  Dyspnea EXAM: PORTABLE CHEST 1 VIEW COMPARISON:  03/28/2020 FINDINGS: Cardiac shadow is within normal limits. Lungs are well aerated bilaterally. Patchy airspace opacity is again identified throughout both lungs similar to that seen on the prior study. Stable lytic lesions are noted within the bony skeleton. IMPRESSION: Stable patchy airspace opacities bilaterally. Electronically Signed   By: Inez Catalina M.D.   On: 03/30/2020 21:38        Scheduled Meds: . (feeding supplement) PROSource Plus  30 mL Oral BID BM  . sodium chloride   Intravenous Once  . sodium chloride   Intravenous Once  . acyclovir  400 mg Oral Daily  . carvedilol  3.125 mg Oral BID WC  . dexamethasone (DECADRON) injection  2 mg Intravenous QODAY  . insulin aspart  0-9 Units Subcutaneous TID WC  . insulin aspart  3 Units Subcutaneous TID WC  . insulin glargine  15 Units Subcutaneous QHS  . lactulose  20 g Oral  TID  . levETIRAcetam  500 mg Oral BID  . lidocaine  2 patch Transdermal Q24H  . melatonin  3 mg Oral QHS  . midodrine  5 mg Oral BID WC  . pantoprazole  40 mg Oral BID AC  . Ensure Max Protein  11 oz Oral BID BM  . QUEtiapine  12.5 mg Oral QHS  . senna-docusate  2 tablet Oral BID  . sodium chloride  2 spray Each Nare Q1H while awake  . sucralfate  1 g Oral TID WC & HS   Continuous Infusions: . sodium chloride Stopped (03/28/20 0000)  .  ceFAZolin (ANCEF) IV 2 g (03/31/20 0526)          Aline August, MD [facility] 03/31/2020, 7:38 AM

## 2020-03-31 NOTE — Progress Notes (Signed)
Patient stated that he wants the healthcare providers, including the oncologist to speak with wife and him about prognosis. He states that his wife does not understand and accepts the reality. He wants to know how much time he has left and how bad the cancer really is. Patient did not know he will discharge to SNF. He states that his wife can not drive and takes care of daughter at home so can not get here early enough. He states her opinion/presence is very important; he does not want to make any decisions without her. He would like for the doctors to come when she is here with him. He believes she will be here around 11am-12pm.

## 2020-03-31 NOTE — Progress Notes (Signed)
Patient stating he feels more short of breath this afternoon. Upon assessment patient tachyphenic and diminshed lung sounds.  Vitals 131/70 HR 106 RR 25-30. Paged [facility], got orders for IV lasix 40mg   And CXR. Will continue to monitor patient.

## 2020-03-31 NOTE — Progress Notes (Signed)
[facility] [phone]   [phone]  PROGRESS NOTE  Patient Care Team: Patient, No Pcp Per as PCP - General (General Practice)  Hematological/Oncological History # Lytic Lesions Throughout Skeleton/Hypercalcemia # Pathologic Fractures of Spine/Soft Tissue Masses in Spine # Multiple Myeloma Confirmed by Bone Marrow Biopsy 1) 02/14/2020: presented to the emergency department with markedly worsening back pain. 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 The T6 lesion severely narrows the thecal sac and deforms the spinal cord. 3) 02/18/2020: establish care with Dr. Lorenso Courier   HISTORY OF PRESENTING ILLNESS:  Trevor Shelton 54 y.o. male with medical history significant for HTN and HLD who presented with back pain and was found to have cord compression and lytic lesions consistent with multiple myeloma.   Interval History: --completed abx course for MSSA bacteremia. Continuing Ancef until 04/04/2020 --neurological status stabilized --lung lesion biopsied, found to be blood/debris without carcinoma --Plt count improving, Hgb low but stable   MEDICAL HISTORY:  Past Medical History:  Diagnosis Date  . AKI (acute kidney injury) ([place]) 02/2020  . Hypercalcemia 02/2020  . Hypercholesteremia   . Hypertension     SURGICAL HISTORY: Past Surgical History:  Procedure Laterality Date  . HERNIA REPAIR    . IR FLUORO GUIDE CV LINE RIGHT  02/17/2020  . IR REMOVAL TUN CV CATH W/O FL  03/01/2020  . IR US GUIDE VASC ACCESS RIGHT  02/17/2020    SOCIAL HISTORY: Social History   Socioeconomic History  . Marital status: Married    Spouse name: Not on file  . Number of children: Not on file  . Years of education: Not on file  . Highest education level: Not on file  Occupational History  . Not on file  Tobacco Use  . Smoking status:  Never Smoker  . Smokeless tobacco: Never Used  Vaping Use  . Vaping Use: Never used  Substance and Sexual Activity  . Alcohol use: Not Currently  . Drug use: Not Currently  . Sexual activity: Not Currently  Other Topics Concern  . Not on file  Social History Narrative  . Not on file   Social Determinants of Health   Financial Resource Strain:   . Difficulty of Paying Living Expenses: Not on file  Food Insecurity:   . Worried About Charity fundraiser in the Last Year: Not on file  . Ran Out of Food in the Last Year: Not on file  Transportation Needs:   . Lack of Transportation (Medical): Not on file  . Lack of Transportation (Non-Medical): Not on file  Physical Activity:   . Days of Exercise per Week: Not on file  . Minutes of Exercise per Session: Not on file  Stress:   . Feeling of Stress : Not on file  Social Connections:   . Frequency of Communication with Friends and Family: Not on file  . Frequency of Social Gatherings with Friends and Family: Not on file  . Attends Religious Services: Not on file  . Active Member of Clubs or Organizations: Not on file  . Attends Archivist Meetings: Not on file  . Marital Status: Not on file  Intimate Partner Violence:   . Fear of Current or Ex-Partner: Not on file  . Emotionally Abused: Not on file  . Physically Abused: Not on file  . Sexually Abused: Not on file  FAMILY HISTORY: Family History  Problem Relation Age of Onset  . Alzheimer's disease Mother   . Diabetes Father   . Diabetes Brother     ALLERGIES:  is allergic to other and penicillins.  MEDICATIONS:  Current Facility-Administered Medications  Medication Dose Route Frequency Provider Last Rate Last Admin  . (feeding supplement) PROSource Plus liquid 30 mL  30 mL Oral BID BM Li, Na, MD   30 mL at 03/31/20 1416  . 0.9 %  sodium chloride infusion   Intravenous PRN Domenic Polite, MD 10 mL/hr at 03/31/20 2107 Restarted at 03/31/20 2107  .  acetaminophen (TYLENOL) tablet 650 mg  650 mg Oral Q6H Knox Royalty, NP   650 mg at 03/31/20 1415  . acyclovir (ZOVIRAX) 200 MG capsule 400 mg  400 mg Oral Daily Nicoletta Dress, Na, MD   400 mg at 03/31/20 0923  . albuterol (PROVENTIL) (2.5 MG/3ML) 0.083% nebulizer solution 2.5 mg  2.5 mg Nebulization TID PRN Kc, Maren Beach, MD      . alum & mag hydroxide-simeth (MAALOX/MYLANTA) 200-200-20 MG/5ML suspension 30 mL  30 mL Oral Q4H PRN Nicoletta Dress, Na, MD   30 mL at 03/28/20 2051  . bisacodyl (DULCOLAX) suppository 10 mg  10 mg Rectal Daily PRN Nicoletta Dress, Na, MD      . carvedilol (COREG) tablet 3.125 mg  3.125 mg Oral BID WC Li, Na, MD   3.125 mg at 03/31/20 1829  . ceFAZolin (ANCEF) IVPB 2g/100 mL premix  2 g Intravenous Q8H Einar Grad, RPH 200 mL/hr at 03/31/20 2116 2 g at 03/31/20 2116  . dexamethasone (DECADRON) injection 2 mg  2 mg Intravenous Janeece Agee R, NP   2 mg at 03/31/20 0910  . diphenhydrAMINE (BENADRYL) 12.5 MG/5ML elixir 12.5-25 mg  12.5-25 mg Oral Q6H PRN Nicoletta Dress, Na, MD      . furosemide (LASIX) injection 40 mg  40 mg Intravenous Q12H Starla Link, Kshitiz, MD   40 mg at 03/31/20 1111  . guaiFENesin-dextromethorphan (ROBITUSSIN DM) 100-10 MG/5ML syrup 5-10 mL  5-10 mL Oral Q6H PRN Li, Na, MD      . insulin aspart (novoLOG) injection 0-9 Units  0-9 Units Subcutaneous TID WC Nicoletta Dress, Na, MD   3 Units at 03/31/20 1830  . insulin aspart (novoLOG) injection 3 Units  3 Units Subcutaneous TID WC Nicoletta Dress, Na, MD   3 Units at 03/31/20 1829  . insulin glargine (LANTUS) injection 15 Units  15 Units Subcutaneous QHS Antonieta Pert, MD   15 Units at 03/31/20 2119  . lactulose (CHRONULAC) 10 GM/15ML solution 20 g  20 g Oral TID Domenic Polite, MD   20 g at 03/27/20 2059  . levETIRAcetam (KEPPRA) tablet 500 mg  500 mg Oral BID Garvin Fila, MD   500 mg at 03/31/20 9675  . lidocaine (LIDODERM) 5 % 2 patch  2 patch Transdermal Q24H Charlann Lange, MD   2 patch at 03/31/20 0915  . lidocaine (XYLOCAINE) 2 % jelly   Topical PRN Li, Na, MD        . lidocaine (XYLOCAINE) 2 % viscous mouth solution 15 mL  15 mL Mouth/Throat Q6H PRN Nicoletta Dress, Na, MD      . magic mouthwash w/lidocaine  5 mL Oral QID PRN Nicoletta Dress, Na, MD      . melatonin tablet 3 mg  3 mg Oral QHS Kc, Ramesh, MD   3 mg at 03/30/20 2124  . midodrine (PROAMATINE) tablet 2.5 mg  2.5 mg Oral BID WC Alekh,  Kshitiz, MD   2.5 mg at 03/31/20 1234  . morphine 2 MG/ML injection 2 mg  2 mg Intravenous Q4H PRN Shela Leff, MD   2 mg at 03/31/20 1544  . ondansetron (ZOFRAN) tablet 4 mg  4 mg Oral Q6H PRN Nicoletta Dress, Na, MD       Or  . ondansetron (ZOFRAN) injection 4 mg  4 mg Intravenous Q6H PRN Nicoletta Dress, Na, MD      . ondansetron (ZOFRAN) tablet 4 mg  4 mg Oral Q6H PRN Nicoletta Dress, Na, MD      . oxymetazoline (AFRIN) 0.05 % nasal spray 1 spray  1 spray Each Nare BID PRN Antonieta Pert, MD   1 spray at 03/26/20 1851  . pantoprazole (PROTONIX) EC tablet 40 mg  40 mg Oral BID AC Li, Na, MD   40 mg at 03/31/20 1544  . polyethylene glycol (MIRALAX / GLYCOLAX) packet 17 g  17 g Oral Daily PRN Nicoletta Dress, Na, MD      . prochlorperazine (COMPAZINE) tablet 5-10 mg  5-10 mg Oral Q6H PRN Nicoletta Dress, Na, MD       Or  . prochlorperazine (COMPAZINE) suppository 12.5 mg  12.5 mg Rectal Q6H PRN Nicoletta Dress, Na, MD      . protein supplement (ENSURE MAX) liquid  11 oz Oral BID BM Domenic Polite, MD   11 oz at 03/31/20 1544  . QUEtiapine (SEROQUEL) tablet 12.5 mg  12.5 mg Oral QHS Garvin Fila, MD   12.5 mg at 03/30/20 2125  . senna-docusate (Senokot-S) tablet 2 tablet  2 tablet Oral BID Charlann Lange, MD   2 tablet at 03/31/20 0923  . simethicone (MYLICON) chewable tablet 80 mg  80 mg Oral QID PRN Nicoletta Dress, Na, MD   80 mg at 03/23/20 1151  . sodium bicarbonate/sodium chloride mouthwash 1031m   Mouth Rinse PRN LNicoletta Dress Na, MD      . sodium chloride (OCEAN) 0.65 % nasal spray 2 spray  2 spray Each Nare Q1H while awake LNicoletta Dress Na, MD   2 spray at 03/31/20 1833  . sorbitol 70 % solution 30 mL  30 mL Oral Daily PRN LNicoletta Dress Na, MD      . sucralfate (CARAFATE) 1 GM/10ML suspension 1 g   1 g Oral TID WC & HS LNicoletta Dress Na, MD   1 g at 03/31/20 2118  . white petrolatum (VASELINE) gel   Topical PRN LCharlann Lange MD   0.2 application at [id number]/10/962124    REVIEW OF SYSTEMS:   Constitutional: ( - ) fevers, ( - )  chills , ( - ) night sweats Eyes: ( - ) blurriness of vision, ( - ) double vision, ( - ) watery eyes Ears, nose, mouth, throat, and face: (-) Epistaxis ( - ) mucositis, ( - ) sore throat Respiratory: ( - ) cough, ( - ) dyspnea, ( - ) wheezes Cardiovascular: ( - ) palpitation, ( - ) chest discomfort, ( - ) lower extremity swelling Gastrointestinal:  ( - ) nausea, ( - ) heartburn, ( - ) change in bowel habits Skin: ( - ) abnormal skin rashes Lymphatics: ( - ) new lymphadenopathy, ( - ) easy bruising Neurological: ( - ) numbness, ( - ) tingling, ( - ) new weaknesses Behavioral/Psych: ( - ) mood change, ( - ) new changes  All other systems were reviewed with the patient and are negative.  PHYSICAL EXAMINATION:  GENERAL: Chronically ill-appearing male, laying in bed SKIN: skin color, texture, turgor are normal, no  rashes or significant lesions OROPHARYNX: Dried blisters on lips  EYES: conjunctiva are pink and non-injected, sclera non-injected LUNGS: Diminished breath sounds HEART: Regular rate and rhythm ABDOMEN: Positive bowel sounds, distended Musculoskeletal: no cyanosis of digits and no clubbing.  5/5 strength in the right lower extremity and 3/5 strength in the left lower extremity. PSYCH: alert & oriented x 3, fluent speech  LABORATORY DATA:  I have reviewed the data as listed CBC Latest Ref Rng & Units 03/31/2020 03/30/2020 03/29/2020  WBC 4.0 - 10.5 K/uL 3.9(L) 4.3 4.1  Hemoglobin 13.0 - 17.0 g/dL 7.4(L) 7.9(L) 7.1(L)  Hematocrit 39 - 52 % 22.4(L) 23.2(L) 20.5(L)  Platelets 150 - 400 K/uL 114(L) 141(L) 123(L)    CMP Latest Ref Rng & Units 03/31/2020 03/30/2020 03/29/2020  Glucose 70 - 99 mg/dL 134(H) 215(H) 173(H)  BUN 6 - 20 mg/dL 19 23(H) 19  Creatinine 0.61 - 1.24  mg/dL 0.63 0.77 0.79  Sodium 135 - 145 mmol/L 126(L) 129(L) 129(L)  Potassium 3.5 - 5.1 mmol/L 3.7 4.2 3.7  Chloride 98 - 111 mmol/L 90(L) 92(L) 95(L)  CO2 22 - 32 mmol/L _0 Calcium 8.9 - 10.3 mg/dL 9.1 9.1 9.0  Total Protein 6.5 - 8.1 g/dL - - 5.1(L)  Total Bilirubin 0.3 - 1.2 mg/dL - - 0.2(L)  Alkaline Phos 38 - 126 U/L - - 78  AST 15 - 41 U/L - - 38  ALT 0 - 44 U/L - - 17     RADIOGRAPHIC STUDIES: I have personally reviewed the radiological images as listed and agreed with the findings in the report pronounced lytic lesions and cord compression in thoracic spine.   EEG  Result Date: 03/22/2020 Lora Havens, MD     03/22/2020  1:52 PM Patient Name: Trevor Shelton MRN: [id number] Epilepsy Attending: Lora Havens Referring Physician/Provider: Dr Karena Addison Aroor Date: 03/22/2020 Duration: 23.25 mins Patient history: 53yo m with AMS. CTH showed right frontal mass concerning for mets. EEG to evaluate for seizure. Level of alertness: awake AEDs during EEG study: LEV Technical aspects: This EEG study was done with scalp electrodes positioned according to the 10-20 International system of electrode placement. Electrical activity was acquired at a sampling rate of _1  and reviewed with a high frequency filter of _2  and a low frequency filter of _3 . EEG data were recorded continuously and digitally stored. Description: No posterior dominant rhythm was seen. EEG showed continuous generalized 3 to 6 Hz theta-delta slowing. Hyperventilation and photic stimulation were not performed.   ABNORMALITY -Continuous slow, generalized IMPRESSION: This study is suggestive of moderate diffuse encephalopathy, nonspecific etiology. No seizures or epileptiform discharges were seen throughout the recording. Priyanka Barbra Sarks   CT ABDOMEN PELVIS WO CONTRAST  Result Date: 03/24/2020 CLINICAL DATA:  Abdominal pain, abdominal distension EXAM: CT ABDOMEN AND PELVIS WITHOUT CONTRAST TECHNIQUE: Multidetector CT  imaging of the abdomen and pelvis was performed following the standard protocol without IV contrast. Oral enteric contrast was administered. COMPARISON:  CT chest abdomen pelvis, 03/20/2020 FINDINGS: Lower chest: Dependent bibasilar atelectasis and/or consolidation with small bilateral pleural effusions, similar to prior examination. Hepatobiliary: No solid liver abnormality is seen. No gallstones, gallbladder wall thickening, or biliary dilatation. Pancreas: Unremarkable. No pancreatic ductal dilatation or surrounding inflammatory changes. Spleen: Normal in size without significant abnormality. Adrenals/Urinary Tract: Adrenal glands are unremarkable. Kidneys are normal, without renal calculi, solid lesion, or hydronephrosis. Bladder is unremarkable. Stomach/Bowel: Stomach is within normal limits. The small bowel and colon are diffusely gas distended,  gas and stool present to the rectum, and degree of bowel distension somewhat increased compared to prior examination. Largest loops of mid small bowel measure up to 4.4 cm in caliber. The cecum measures up to 9.4 cm. Vascular/Lymphatic: No significant vascular findings are present. No enlarged abdominal or pelvic lymph nodes. Reproductive: No mass or other significant abnormality. Other: Small fat and fluid containing bilateral inguinal hernias. Trace ascites throughout the abdomen and pelvis, new compared to prior examination. Musculoskeletal: There is redemonstrated, diffuse lytic osseous metastatic disease, most notable for a large lytic lesion and associated pathologic fracture of the T9 vertebral body. IMPRESSION: 1. The small bowel and colon are diffusely gas distended, gas and stool present to the rectum, and degree of bowel distension somewhat increased compared to prior examination. Findings are most consistent with ileus. 2. Trace ascites throughout the abdomen and pelvis, new compared to prior examination. 3. Dependent bibasilar atelectasis and/or  consolidation of the included lung bases with small bilateral pleural effusions, similar to prior examination. 4. There is redemonstrated, diffuse lytic osseous metastatic disease, most notable for a large lytic lesion and associated pathologic fracture of the T9 vertebral body. Findings are in keeping with multiple myeloma. Electronically Signed   By: Eddie Candle M.D.   On: 03/24/2020 17:21   DG Chest 2 View  Result Date: 03/15/2020 CLINICAL DATA:  Shortness of breath and fever EXAM: CHEST - 2 VIEW COMPARISON:  February 25, 2020 FINDINGS: There is mild atelectatic change in the left mid lung and bibasilar regions. There is no edema or airspace opacity. Heart is upper normal in size with pulmonary vascularity normal. No adenopathy. No bone lesions. IMPRESSION: Areas of patchy atelectasis bilaterally. No edema or airspace opacity. Cardiac silhouette within normal limits. Electronically Signed   By: Lowella Grip III M.D.   On: 03/15/2020 15:21   DG Abd 1 View  Result Date: 03/13/2020 CLINICAL DATA:  Nausea, vomiting. EXAM: ABDOMEN - 1 VIEW COMPARISON:  March 10, 2020. FINDINGS: The bowel gas pattern is normal. Moderate amount of stool seen in right colon. No radio-opaque calculi or other significant radiographic abnormality are seen. IMPRESSION: Moderate stool burden is noted. No evidence of bowel obstruction or ileus. Electronically Signed   By: Marijo Conception M.D.   On: 03/13/2020 11:31   DG Abd 1 View  Result Date: 03/10/2020 CLINICAL DATA:  Abdominal pain. EXAM: ABDOMEN - 1 VIEW COMPARISON:  Radiograph 03/05/2020. FINDINGS: Mild gaseous distention of large and small bowel in a nonobstructive pattern. Small to moderate stool in the right colon, with diminished stool burden from prior exam. There is no evidence of free intra-abdominal air. No radiopaque calculi. No osseous abnormalities are seen. IMPRESSION: Bowel-gas pattern suggestive of ileus, with slight improvement in gaseous bowel distension  over the past few days. Electronically Signed   By: Keith Rake M.D.   On: 03/10/2020 14:13   DG Abd 1 View  Result Date: 03/05/2020 CLINICAL DATA:  Abdominal pain and bloating. EXAM: ABDOMEN - 1 VIEW COMPARISON:  Abdominal radiograph 03/03/2020, CT 02/14/2020 FINDINGS: No evidence of bowel obstruction on the supine views. Mild gaseous distention of stomach with slight improvement from prior. Mild gaseous small bowel distension and a generalized pattern. Moderate colonic stool burden. No abnormal rectal distention. No radiopaque calculi. No acute osseous abnormalities are seen. IMPRESSION: Ileus bowel gas pattern with mild gaseous distension of small bowel loops and stomach with slight improvement from recent exam. No evidence of developing obstruction. Moderate colonic stool burden. Electronically  Signed   By: Keith Rake M.D.   On: 03/05/2020 15:47   DG Abd 1 View  Result Date: 03/03/2020 CLINICAL DATA:  Cord compression.  Left upper quadrant pain. EXAM: ABDOMEN - 1 VIEW COMPARISON:  CT 02/14/2020. FINDINGS: Distended loops of small and large bowel noted suggesting adynamic ileus. Recent CT of the abdomen pelvis of 02/14/2020 did reveal a right inguinal hernia with herniation of fat. No free air. Bony lesions present best identified by prior CT. IMPRESSION: Distended loops of small and large bowel noted suggesting adynamic ileus. Recent CT of the abdomen pelvis of 02/14/2020 however did reveal a right inguinal hernia with herniation of fat. Follow-up exams suggested to demonstrate resolution of bowel distention in order to exclude bowel obstruction. Electronically Signed   By: Marcello Moores  Register   On: 03/03/2020 13:56   CT HEAD WO CONTRAST  Result Date: 03/22/2020 CLINICAL DATA:  Headache EXAM: CT HEAD WITHOUT CONTRAST TECHNIQUE: Contiguous axial images were obtained from the base of the skull through the vertex without intravenous contrast. COMPARISON:  Brain MRI 03/20/2020 FINDINGS: Brain:  Trace extra-axial hemorrhage beneath the right frontal lobe remains faintly visible. No new site of hemorrhage. No midline shift or other mass effect. Vascular: No hyperdense vessel or unexpected calcification. Skull: Numerous lucent lesions throughout the calvarium and skull base. The largest lesion is at the left clivus. Sinuses/Orbits: No acute finding. Other: None. IMPRESSION: 1. Trace extra-axial hemorrhage beneath the right frontal lobe remains faintly visible. No new site of hemorrhage. 2. Numerous lucent lesions throughout the calvarium and skull base, consistent with known multiple myeloma. Electronically Signed   By: Ulyses Jarred M.D.   On: 03/22/2020 00:12   CT HEAD WO CONTRAST  Result Date: 03/20/2020 CLINICAL DATA:  Stroke, follow-up. EXAM: CT HEAD WITHOUT CONTRAST TECHNIQUE: Contiguous axial images were obtained from the base of the skull through the vertex without intravenous contrast. COMPARISON:  Brain MRI 03/20/2020. CT head 03/20/2020 FINDINGS: Brain: The examination is mild to moderately motion degraded, limiting evaluation. A previously demonstrated foci of intracranial hemorrhage along the anteroinferior right frontal lobe convexity (suspected both intraparenchymal and extra-axial) has decreased in conspicuity (series 5, image 16, image 17). No new site of acute intracranial hemorrhage is identified. There is no significant mass effect.  No midline shift. Vascular: No hyperdense vessel Skull: Redemonstrated are innumerable marrow replacing lesions throughout the calvarium, skull base and visualized upper cervical spine compatible with the known history of multiple myeloma Sinuses/Orbits: No acute orbital abnormality identified. No significant paranasal sinus disease or mastoid effusion. IMPRESSION: Motion degraded examination. Interval decrease in size and conspicuity of a known focus of intracranial hemorrhage along the anteroinferior right frontal lobe convexity (suspected both  intraparenchymal and extra-axial). No new site of acute intracranial hemorrhage is identified. Redemonstrated innumerable marrow replacing lesions throughout the calvarium, skull base and visualized upper cervical spine compatible with the known history of multiple myeloma. Electronically Signed   By: Kellie Simmering DO   On: 03/20/2020 13:39   CT HEAD WO CONTRAST  Result Date: 03/20/2020 CLINICAL DATA:  Initial evaluation for acute altered mental status. EXAM: CT HEAD WITHOUT CONTRAST TECHNIQUE: Contiguous axial images were obtained from the base of the skull through the vertex without intravenous contrast. COMPARISON:  None available. FINDINGS: Brain: Cerebral volume within normal limits for age. There is a somewhat focal lobulated hyperdensity immediately subjacent to the right frontal calvarium (series 3, image 18) finding also seen on coronal sequence (series 5, images 21-23) and sagittal sequence (  series 6, image 23). Lesion measures up to 2.1 cm in greatest dimension on sagittal sequence. While this area is often prone to artifact, finding is suspicious for a small bleed. Finding is favored to be intra-axial in nature, although a small extra-axial component may be present as well. No significant mass effect or edema. No other acute intracranial hemorrhage. No acute large vessel territory infarct. No mass lesion, midline shift, or mass effect. No hydrocephalus. Vascular: No hyperdense vessel. Skull: Scalp soft tissues demonstrate no acute finding. Multiple scattered lucent lesion seen throughout the calvarium and visualized upper cervical spine. Note made of a prominent 1.7 cm lytic lesion within the clivus. Findings are nonspecific, but concerning for possible Mets or myeloma. Sinuses/Orbits: Globes and orbital soft tissues within normal limits. Paranasal sinuses are clear. Trace left mastoid effusion noted. Other: None. IMPRESSION: 1. Small lobulated hyperdensity immediately subjacent to the right frontal  calvarium as above. Highly suspicious for a small bleed. No significant mass effect or edema. 2. Multiple scattered lucent lesions throughout the calvarium and visualized upper cervical spine, consistent with history of multiple myeloma. 3. No other acute intracranial abnormality. Critical Value/emergent results were called by telephone at the time of interpretation on 03/20/2020 at 1:12 am to provider NA LI , who verbally acknowledged these results. Electronically Signed   By: Jeannine Boga M.D.   On: 03/20/2020 01:20   CT ANGIO CHEST PE W OR WO CONTRAST  Result Date: 03/27/2020 CLINICAL DATA:  Chest pain and difficulty breathing. Possible pulmonary embolism. EXAM: CT ANGIOGRAPHY CHEST WITH CONTRAST TECHNIQUE: Multidetector CT imaging of the chest was performed using the standard protocol during bolus administration of intravenous contrast. Multiplanar CT image reconstructions and MIPs were obtained to evaluate the vascular anatomy. CONTRAST:  71m OMNIPAQUE IOHEXOL 350 MG/ML SOLN COMPARISON:  Chest CT 03/20/2020 FINDINGS: Cardiovascular: Mild stable cardiomegaly. Minimal calcified plaque over the left anterior descending and right coronary arteries. Thoracic aorta is normal in caliber. Pulmonary arterial system is well opacified without evidence of emboli. Mediastinum/Nodes: No evidence of mediastinal or hilar adenopathy. Interval development of left axillary adenopathy. Lungs/Pleura: Lungs are adequately inflated and demonstrate slight worsening small bilateral pleural effusions with associated posterior bibasilar atelectasis. Interval worsening of mild patchy bilateral airspace opacification most prominent over the periphery of the right upper lung. Findings may be due to multifocal infection. Airways are unremarkable. Interval enlargement of pleural based mass over the anterolateral left upper thorax measuring 2.3 x 5.6 cm (previously 1.4 x 5.1 cm). Upper Abdomen: No acute findings. Musculoskeletal:  Interval progression of diffuse lytic process throughout the axial and appendicular bony structures compatible with known multiple myeloma. Multiple bilateral rib fractures unchanged. Fracture of the body of the sternum just below the sternomanubrial joint unchanged. Fracture with mild associated soft tissue density/lytic process over the inferior aspect of the right scapula with interval progression. Known pathologic compression fractures T6 and T9 without significant change. All these fracture are likely pathologic secondary to patient's underlying multiple myeloma. Review of the MIP images confirms the above findings. IMPRESSION: 1. No evidence of pulmonary embolism. 2. Interval worsening of mild patchy bilateral airspace process most prominent over the periphery of the right upper lung which may be due to multifocal infection. Slight worsening small bilateral pleural effusions with associated bibasilar atelectasis. 3. Interval enlargement of a pleural based mass over the anterolateral left upper thorax measuring 2.3 x 5.6 cm (previously 1.4 x 5.1 cm). Interval development of left axillary adenopathy. Findings are likely due to patient's underlying  multiple myeloma. 4. Stable cardiomegaly and minimal atherosclerotic coronary artery disease. 5. Diffuse lytic process throughout the axial and appendicular bony structures compatible with patient's known multiple myeloma. Multiple associated spinal compression fractures, rib fractures, sternal fracture and right inferior scapular fracture likely pathologic due to underlying myeloma. Electronically Signed   By: Marin Olp M.D.   On: 03/27/2020 16:06   MR BRAIN W WO CONTRAST  Result Date: 03/20/2020 CLINICAL DATA:  Initial evaluation for brain mass or lesion. EXAM: MRI HEAD WITHOUT AND WITH CONTRAST TECHNIQUE: Multiplanar, multiecho pulse sequences of the brain and surrounding structures were obtained without and with intravenous contrast. CONTRAST:  19m GADAVIST  GADOBUTROL 1 MMOL/ML IV SOLN COMPARISON:  Prior head CT from earlier the same day. FINDINGS: Brain: Cerebral volume within normal limits for age. No significant cerebral white matter disease or other focal parenchymal signal abnormality. Abnormal T1/FLAIR hyperintensity involving the anterior inferior right frontal convexity, consistent with acute intracranial hemorrhage, corresponding with abnormality on prior CT (series 11, images 10-14). This appears to be both intraparenchymal and extra-axial, and is favored to have initially represented a small parenchymal bleed that developed subdural/extra-axial extension. Size of this hemorrhage measures approximately 1.8 cm in greatest dimension on sagittal sequences. No associated edema or significant regional mass effect. No definite underlying lesion. Slight asymmetric smooth dural thickening and enhancement seen elsewhere about the right cerebral convexity, favored to be reactive. Consideration given to possible myelomatous involvement of the dura, although this would be expected to be more nodular and irregular in appearance. No other acute intracranial hemorrhage identified. Single punctate focus of susceptibility artifact noted at the right frontal centrum semi ovale, likely a small chronic microhemorrhage, of doubtful significance in isolation. No evidence for acute or subacute infarct. No encephalomalacia to suggest chronic cortical infarction. No appreciable intra-axial mass lesion. Ventricles normal size without hydrocephalus. No other extra-axial fluid collection. 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 Midline structures intact. No other abnormal enhancement. Vascular: Major intracranial vascular flow voids are maintained. Skull and upper cervical spine: Craniocervical junction within normal limits. Extensive and innumerable marrow replacing lesions seen throughout the calvarium, skull base, and visualized upper spine, compatible with history of multiple myeloma.  Most prominent of these lesions positioned in the clivus and measures approximately 2 cm in size. No other scalp soft tissue abnormality. Sinuses/Orbits: Globes and orbital soft tissues within normal limits. Paranasal sinuses are largely clear. No significant mastoid effusion. Inner ear structures grossly normal. Other: None. IMPRESSION: 1. Acute intracranial hemorrhage involving the anterior-inferior right frontal convexity, stable from prior head CT. No associated edema or significant mass effect. 2. No other acute intracranial abnormality. 3. Innumerable marrow replacing lesions throughout the calvarium, skull base, and visualized upper spine, compatible with history of multiple myeloma. Electronically Signed   By: BJeannine BogaM.D.   On: 03/20/2020 07:35   CT CHEST ABDOMEN PELVIS W CONTRAST  Result Date: 03/20/2020 CLINICAL DATA:  Abdominal distension. EXAM: CT CHEST, ABDOMEN, AND PELVIS WITH CONTRAST TECHNIQUE: Multidetector CT imaging of the chest, abdomen and pelvis was performed following the standard protocol during bolus administration of intravenous contrast. CONTRAST:  1066mOMNIPAQUE IOHEXOL 300 MG/ML  SOLN COMPARISON:  February 14, 2020 FINDINGS: CT CHEST FINDINGS Cardiovascular: No significant vascular findings. There is mild cardiomegaly. No pericardial effusion. Mediastinum/Nodes: No enlarged mediastinal, hilar, or axillary lymph nodes. Thyroid gland, trachea, and esophagus demonstrate no significant findings. Lungs/Pleura: Mild to moderate severity areas of atelectasis and/or infiltrate are seen within the inferior aspect of the  left upper lobe posterior aspect of the superior portion of the right lower lobe and posterior aspect of the bilateral lung bases. There is a very small left pleural effusion. A 5.2 cm x 1.5 cm pleural based soft tissue mass is seen along the anterior aspect of the left upper lobe (axial CT image 22, CT series number 3). A 1.4 cm pleural based soft tissue nodule is  seen within this region on the prior study. Musculoskeletal: Chronic anterior second, third and fourth left rib fractures are noted. A chronic fracture of the right scapula is also seen. Numerous lytic lesions are seen throughout the thoracic spine with a chronic pathologic fractures noted at the levels of T6 and T9 vertebral bodies. These are seen on the prior study. CT ABDOMEN PELVIS FINDINGS Hepatobiliary: A 9 mm cystic appearing area is seen within the medial aspect of the right lobe of the liver. No gallstones, gallbladder wall thickening, or biliary dilatation. Pancreas: Unremarkable. No pancreatic ductal dilatation or surrounding inflammatory changes. Spleen: Normal in size without focal abnormality. Adrenals/Urinary Tract: Adrenal glands are unremarkable. Kidneys are normal in size, without renal or hydronephrosis. A 1.3 cm simple cyst is seen within the anteromedial aspect of the upper left kidney. Bladder is unremarkable. Stomach/Bowel: There is a small hiatal hernia. Appendix appears normal. No evidence of bowel wall thickening, distention, or inflammatory changes. A moderate amount of stool is seen within the distal sigmoid colon and rectum. Vascular/Lymphatic: No significant vascular findings are present. No enlarged abdominal or pelvic lymph nodes. Reproductive: The prostate gland is markedly enlarged. Other: A 4.0 cm x 3.2 cm fat-containing right scrotal hernia is noted. No abdominopelvic ascites. Musculoskeletal: Numerous lytic lesions are seen throughout the lumbar spine and pelvis. IMPRESSION: 1. Mild to moderate severity bilateral areas of atelectasis and/or infiltrate. 2. 5.2 cm x 1.5 cm pleural based soft tissue mass along the anterior aspect of the left upper lobe. A 1.4 cm pleural based soft tissue nodule is seen within this region on the prior study. These findings are concerning for the presence of a primary lung malignancy. Further evaluation with a nuclear medicine PET/CT is recommended.  3. Findings consistent with multiple myeloma versus diffuse osseous metastasis, with chronic pathologic fractures at the levels of T6 and T9 vertebral bodies. 4. Markedly enlarged prostate gland. 5. 4.0 cm x 3.2 cm fat-containing right scrotal hernia. 6. Mild cardiomegaly. 7. Small hiatal hernia. 8. Aortic atherosclerosis. 9. Small simple cyst within the left kidney. Aortic Atherosclerosis (ICD10-I70.0). Electronically Signed   By: Virgina Norfolk M.D.   On: 03/20/2020 01:09   CT BIOPSY  Result Date: 03/29/2020 INDICATION: 54 year old male with history of multiple myeloma and left anterior chest wall mass. EXAM: CT BIOPSY COMPARISON:  Chest CT from 03/27/2020 MEDICATIONS: None. ANESTHESIA/SEDATION: Fentanyl 25 mcg IV; Versed 0.5 mg IV Sedation time: 18 minutes; The patient was continuously monitored during the procedure by the interventional radiology nurse under my direct supervision. CONTRAST:  None. COMPLICATIONS: None immediate. PROCEDURE: Informed consent was obtained from the patient following an explanation of the procedure, risks, benefits and alternatives. A time out was performed prior to the initiation of the procedure. The patient was positioned supine on the CT table and a limited CT was performed for procedural planning demonstrating similar appearing left anterior chest wall soft tissue mass invading the anterior second and third ribs measuring approximately 5.0 x 3.1 cm in axial dimension. The procedure was planned. The operative site was prepped and draped in the usual sterile fashion.  Appropriate trajectory was confirmed with a 22 gauge spinal needle after the adjacent tissues were anesthetized with 1% Lidocaine with epinephrine. Under intermittent CT guidance, a 17 gauge coaxial needle was advanced into the peripheral aspect of the mass. Appropriate positioning was confirmed and a total of 5 samples were obtained with an 18 gauge core needle biopsy device. The co-axial needle was removed  while injecting Gel-Foam slurry and hemostasis was achieved with manual compression. A limited postprocedural CT was negative for hemorrhage or additional complication. A dressing was placed. The patient tolerated the procedure well without immediate postprocedural complication. IMPRESSION: Technically successful CT guided core needle biopsy of left anterior chest wall mass. Electronically Signed   By: Ruthann Cancer MD   On: 03/29/2020 10:33   DG CHEST PORT 1 VIEW  Result Date: 03/30/2020 CLINICAL DATA:  Dyspnea EXAM: PORTABLE CHEST 1 VIEW COMPARISON:  03/28/2020 FINDINGS: Cardiac shadow is within normal limits. Lungs are well aerated bilaterally. Patchy airspace opacity is again identified throughout both lungs similar to that seen on the prior study. Stable lytic lesions are noted within the bony skeleton. IMPRESSION: Stable patchy airspace opacities bilaterally. Electronically Signed   By: Inez Catalina M.D.   On: 03/30/2020 21:38   DG CHEST PORT 1 VIEW  Result Date: 03/28/2020 CLINICAL DATA:  Tachypnea EXAM: PORTABLE CHEST 1 VIEW COMPARISON:  03/27/2020 CT FINDINGS: Cardiac shadow is stable. Diffuse bilateral infiltrates are again seen similar to that noted on prior chest CT. No sizable effusion is noted. No pneumothorax is noted. Lytic lesions are noted throughout the bony skeleton particularly in the midthoracic spine similar to that seen on recent CT examination. IMPRESSION: Patchy bilateral infiltrate stable from the prior exam. Stable lytic lesions within the bony structures. Electronically Signed   By: Inez Catalina M.D.   On: 03/28/2020 23:04   DG Chest Port 1 View  Result Date: 03/23/2020 CLINICAL DATA:  Difficulty breathing EXAM: PORTABLE CHEST 1 VIEW COMPARISON:  03/23/2019 FINDINGS: Cardiac shadow is mildly prominent but stable. Patchy airspace opacity is again noted bilaterally but improved when compared with the prior exam. No sizable effusion or pneumothorax is noted. No bony abnormality  is seen. IMPRESSION: Improving aeration bilaterally. Electronically Signed   By: Inez Catalina M.D.   On: 03/23/2020 07:37   DG CHEST PORT 1 VIEW  Result Date: 03/22/2020 CLINICAL DATA:  Rapid response EXAM: PORTABLE CHEST 1 VIEW COMPARISON:  03/21/2020, 04/07/2020, 02/25/2020 FINDINGS: Mildly diminished lung volumes. Mild cardiomegaly. Left greater than right airspace disease. No pneumothorax. IMPRESSION: Patchy left greater than right airspace disease concerning for pneumonia. Findings appear slightly worsened. Electronically Signed   By: Donavan Foil M.D.   On: 03/22/2020 00:47   DG CHEST PORT 1 VIEW  Result Date: 03/21/2020 CLINICAL DATA:  Shortness of breath EXAM: PORTABLE CHEST 1 VIEW COMPARISON:  March 19, 2020 chest radiograph and chest CT March 20, 2020 FINDINGS: There is persistent atelectatic change in left mid lung and left base regions. There is also atelectatic change in the right mid lung and right base regions, new. There may be superimposed pneumonia in the left mid lung. There is a small left pleural effusion. Heart is upper normal in size with pulmonary vascularity normal. No adenopathy. Old healed rib trauma on the left, better seen by CT. IMPRESSION: Areas of atelectatic change bilaterally, overall increased bilaterally. Question superimposed pneumonia left mid lung. Small left pleural effusion. Stable cardiac silhouette. Electronically Signed   By: Lowella Grip III M.D.   On:  03/21/2020 08:09   DG Abd Portable 1V  Result Date: 03/15/2020 CLINICAL DATA:  Abdominal distention, ileus EXAM: PORTABLE ABDOMEN - 1 VIEW COMPARISON:  03/18/2020 FINDINGS: Diffuse gaseous distention of bowel, stable or worsening since prior study. No visible free air organomegaly. IMPRESSION: Stable or slight worsening diffuse gaseous distention of bowel compatible with ileus. Electronically Signed   By: Rolm Baptise M.D.   On: 03/23/2020 05:54   DG Abd Portable 1V  Result Date:  03/16/2020 CLINICAL DATA:  Fever EXAM: X-RAY ABDOMEN 1 VIEW COMPARISON:  03/13/2020 FINDINGS: The bowel gas pattern is nonspecific with gaseous distention of loops of small bowel and colon scattered throughout the abdomen. There is an above average amount of stool throughout the ascending colon. There is no definite pneumatosis or free air. No radiopaque kidney stones. IMPRESSION: 1. Nonspecific bowel gas pattern with gaseous distention of loops of small bowel and colon scattered throughout the abdomen. 2. Above average amount of stool throughout the ascending colon. This is not significantly changed from 03/13/2020. Electronically Signed   By: Constance Holster M.D.   On: 03/23/2020 17:46   EEG adult  Result Date: 03/24/2020 Lora Havens, MD     03/24/2020 11:15 AM Patient Name: Trevor Shelton MRN: [id number] Epilepsy Attending: Lora Havens Referring Physician/Provider: Dr Roland Rack Date: 03/24/2020 Duration: 22.49 mins Patient history: 54yo M with seizure like episode. EEG to evaluate for seizure. Level of alertness: Awake AEDs during EEG study: LEV Technical aspects: This EEG study was done with scalp electrodes positioned according to the 10-20 International system of electrode placement. Electrical activity was acquired at a sampling rate of _0  and reviewed with a high frequency filter of _1  and a low frequency filter of _2 . EEG data were recorded continuously and digitally stored. Description: No posterior dominant rhythm was seen. EEG showed continuous generalized 3 to 6 Hz theta-delta slowing. Significant eye flutter artifact was also noted. Hyperventilation and photic stimulation were not performed.   ABNORMALITY -Continuous slow, generalized IMPRESSION: This study is suggestive of moderate diffuse encephalopathy, nonspecific etiology. No seizures or epileptiform discharges were seen throughout the recording. Lora Havens   ECHOCARDIOGRAM COMPLETE  Result Date: 03/21/2020     ECHOCARDIOGRAM REPORT   Patient Name:   Trevor Shelton Date of Exam: 03/21/2020 Medical Rec #:  [id number]   Height:       63.0 in Accession #:    [id number]  Weight:       126.5 lb Date of Birth:  October 29, 1965    BSA:          1.592 m Patient Age:    56 years    BP:           133/99 mmHg Patient Gender: M           HR:           112 bpm. Exam Location:  Inpatient Procedure: 2D Echo, Color Doppler and Cardiac Doppler Indications:    Bacteremia  History:        Patient has no prior history of Echocardiogram examinations.                 Risk Factors:Hypertension, Diabetes and Dyslipidemia.  Sonographer:    Raquel Sarna Senior RDCS Referring Phys: [id number] [facility]  Sonographer Comments: Poor apical window due to restricted mobility. IMPRESSIONS  1. Left ventricular ejection fraction, by estimation, is 70 to 75%. The left ventricle has hyperdynamic function. The left ventricle has no regional wall motion abnormalities.  There is mild left ventricular hypertrophy. Left ventricular diastolic parameters are consistent with Grade I diastolic dysfunction (impaired relaxation).  2. Right ventricular systolic function is normal. The right ventricular size is normal.  3. Left atrial size was moderately dilated.  4. The mitral valve is abnormal. Trivial mitral valve regurgitation.  5. The aortic valve is tricuspid. Aortic valve regurgitation is not visualized.  6. The inferior vena cava is normal in size with greater than 50% respiratory variability, suggesting right atrial pressure of 3 mmHg. Conclusion(s)/Recommendation(s): No evidence of valvular vegetations on this transthoracic echocardiogram. Would recommend a transesophageal echocardiogram to exclude infective endocarditis if clinically indicated. FINDINGS  Left Ventricle: Left ventricular ejection fraction, by estimation, is 70 to 75%. The left ventricle has hyperdynamic function. The left ventricle has no regional wall motion abnormalities. The left ventricular internal cavity size  was normal in size. There is mild left ventricular hypertrophy. Left ventricular diastolic parameters are consistent with Grade I diastolic dysfunction (impaired relaxation). Indeterminate filling pressures. Right Ventricle: The right ventricular size is normal. No increase in right ventricular wall thickness. Right ventricular systolic function is normal. Left Atrium: Left atrial size was moderately dilated. Right Atrium: Right atrial size was normal in size. Pericardium: There is no evidence of pericardial effusion. Mitral Valve: The mitral valve is abnormal. There is mild thickening of the mitral valve leaflet(s). Trivial mitral valve regurgitation. Tricuspid Valve: The tricuspid valve is grossly normal. Tricuspid valve regurgitation is not demonstrated. Aortic Valve: The aortic valve is tricuspid. Aortic valve regurgitation is not visualized. Pulmonic Valve: The pulmonic valve was grossly normal. Pulmonic valve regurgitation is not visualized. Aorta: The aortic root and ascending aorta are structurally normal, with no evidence of dilitation. Venous: The inferior vena cava is normal in size with greater than 50% respiratory variability, suggesting right atrial pressure of 3 mmHg. IAS/Shunts: No atrial level shunt detected by color flow Doppler.  LEFT VENTRICLE PLAX 2D LVIDd:         4.30 cm LVIDs:         2.40 cm LV PW:         1.10 cm LV IVS:        0.90 cm LVOT diam:     2.00 cm LV SV:         78 LV SV Index:   49 LVOT Area:     3.14 cm  RIGHT VENTRICLE RV S prime:     19.80 cm/s TAPSE (M-mode): 2.4 cm LEFT ATRIUM             Index       RIGHT ATRIUM           Index LA diam:        3.70 cm 2.32 cm/m  RA Area:     19.10 cm LA Vol (A2C):   46.0 ml 28.90 ml/m RA Volume:   53.30 ml  33.48 ml/m LA Vol (A4C):   82.5 ml 51.83 ml/m LA Biplane Vol: 63.7 ml 40.02 ml/m  AORTIC VALVE LVOT Vmax:   126.00 cm/s LVOT Vmean:  96.400 cm/s LVOT VTI:    0.248 m  AORTA Ao Root diam: 3.20 cm Ao Asc diam:  2.80 cm  SHUNTS  Systemic VTI:  0.25 m Systemic Diam: 2.00 cm Lyman Bishop MD Electronically signed by Lyman Bishop MD Signature Date/Time: 03/21/2020/12:51:13 PM    Final    VAS [place] LOWER EXTREMITY VENOUS (DVT)  Result Date: 03/03/2020  Lower Venous DVTStudy Indications: Immobility.  Performing Technologist:  Vernon Matacale RCT RDMS  Examination Guidelines: A complete evaluation includes B-mode imaging, spectral Doppler, color Doppler, and power Doppler as needed of all accessible portions of each vessel. Bilateral testing is considered an integral part of a complete examination. Limited examinations for reoccurring indications may be performed as noted. The reflux portion of the exam is performed with the patient in reverse Trendelenburg.  +---------+---------------+---------+-----------+----------+--------------+ RIGHT    CompressibilityPhasicitySpontaneityPropertiesThrombus Aging +---------+---------------+---------+-----------+----------+--------------+ CFV      Full           Yes      Yes                                 +---------+---------------+---------+-----------+----------+--------------+ SFJ      Full                                                        +---------+---------------+---------+-----------+----------+--------------+ FV Prox  Full                                                        +---------+---------------+---------+-----------+----------+--------------+ FV Mid   Full                                                        +---------+---------------+---------+-----------+----------+--------------+ FV DistalFull                                                        +---------+---------------+---------+-----------+----------+--------------+ PFV      Full                                                        +---------+---------------+---------+-----------+----------+--------------+ POP      Full           Yes      Yes                                  +---------+---------------+---------+-----------+----------+--------------+ PTV      Full                                                        +---------+---------------+---------+-----------+----------+--------------+ PERO     Full                                                        +---------+---------------+---------+-----------+----------+--------------+   +---------+---------------+---------+-----------+----------+--------------+  LEFT     CompressibilityPhasicitySpontaneityPropertiesThrombus Aging +---------+---------------+---------+-----------+----------+--------------+ CFV      Full           Yes      Yes                                 +---------+---------------+---------+-----------+----------+--------------+ SFJ      Full                                                        +---------+---------------+---------+-----------+----------+--------------+ FV Prox  Full                                                        +---------+---------------+---------+-----------+----------+--------------+ FV Mid   Full                                                        +---------+---------------+---------+-----------+----------+--------------+ FV DistalFull                                                        +---------+---------------+---------+-----------+----------+--------------+ PFV      Full                                                        +---------+---------------+---------+-----------+----------+--------------+ POP      Full           Yes      Yes                                 +---------+---------------+---------+-----------+----------+--------------+ PTV      Full                                                        +---------+---------------+---------+-----------+----------+--------------+ PERO     Full                                                         +---------+---------------+---------+-----------+----------+--------------+     Summary: RIGHT: - There is no evidence of deep vein thrombosis in the lower extremity.  - No cystic structure found in the popliteal fossa.  LEFT: - There is no evidence of deep vein thrombosis in the lower extremity.  - No  cystic structure found in the popliteal fossa.  *See table(s) above for measurements and observations. Electronically signed by Monica Martinez MD on 03/03/2020 at 3:26:05 PM.    Final     ASSESSMENT & PLAN Trevor Shelton 54 y.o. male with medical history significant for HTN and HLD who presented with back pain and was found to have cord compression and lytic lesions concerning for multiple myeloma. The patient's imaging and blood work are most concerning for multiple myeloma.  Interestingly the M spike and the serum free light chain ratio is relatively modest compared to the extent of the damage seen on CT scan.  The patient had significant hypercalcemia upon presentation. Bone marrow biopsy was performed on 02/19/2020 and was consistent with a plasma cell neoplasm.  Cytogenetics were normal.  He received radiation for impending cord compression which was completed on 03/02/2020.   Recommended CyBorD chemotherapy.  Since the patient was admitted to the inpatient rehab unit, chemotherapy was initiated on 03/15/2020.  He was started on Velcade and dexamethasone only with plan to add Cytoxan as an outpatient. This was held due to numerous complications including brain bleed, MSSA bacteremia, and lung mass.  SPEP 02/15/2020: M protein 0.9 SFLC: kappa 37.5, Lambda 9.9, ratio 3.79 UPEP: 02/17/2020: M spike not observed.  Immunofixation showed IgG monoclonal protein with kappa light chain specificity  # Lytic Lesions Througout Skeleton/Hypercalcemia # Pathologic Fractures of Spine/Soft Tissue Masses in Spine # Multiple Myeloma --bone marrow biopsy performed 02/19/2020 to confirm diagnosis; results consistent with plasma  cell neoplasm.  Normal cytogenetics. --M protein and Kappa ratio are modestly elevated compared the the extent of damage seen on CT/MRI. 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   --The patient was started on CyBorD chemotherapy.  Velcade and dexamethasone were given on 9/7.  We had plan to administer his second dose of Velcade on 9/14, but given his change in status and MSSA bacteremia, chemotherapy was held --Continue acyclovir 400 mg daily. --will reassess for consideration of restarting treatment next week, assuming his overall clinical status and counts stabilize.  --in the interim agree with continued [place] discussions with palliative care. Overall prognosis is not clear, as the biggest issue are the complications he has had preventing him from proceeding with treatment.   #MSSA bacteremia --From possible pneumonia. --completed antibiotics per infectious disease. --currently on Ancef until 04/04/2020.   #Thrombocytopenia, improving --Likely due to underlying multiple myeloma and recent Velcade treatment although Velcade is not known to cause significant pancytopenia.  Could also be due to acute infection/bacteremia/antibiotics. --Continue supportive therapy with platelet transfusion for platelet count less than 20 or if he has severe, active clinical bleeding. --HIT panel not indicative of HIT.  Likely due to bacteremia/acute infection.  Counts are now improving.  #Anemia --Hemoglobin stable at 7.4 today --Anemia likely due to underlying malignancy and acute illness --Monitor hemoglobin and transfuse PRBCs for hemoglobin less than 7.  #Pleural-based soft tissue mass noted on recent CT --biopsy of pleural-based soft tissue mass shows mixed inflammation, debris and hemorrhage --no clear sign of malignancy --consider discussion with pulmonary  regarding their thoughts on this lesion   Ledell Peoples, MD Department of Hematology/Oncology [place] at [facility] Phone: [phone] Pager: [phone] Email: [email or web address]   03/31/2020 9:20 PM

## 2020-03-31 NOTE — Consult Note (Addendum)
Consultation Note Date: 03/31/2020   Patient Name: Trevor Shelton  DOB: Dec 02, 1965  MRN: [id number]  Age / Sex: 54 y.o., male  PCP: Patient, No Pcp Per Referring Physician: Aline August, MD  Reason for Consultation: Establishing goals of care and Psychosocial/spiritual support  HPI/Patient Profile: 54 y.o. male   admitted on 03/18/2020 with PMH significant for MM, initially diagnosed after a fall.  A CT-guided bone marrow biopsy on 8/13/21showedpositive for neoplasm kappa light chain consistent with multiple myeloma. Imaging showed multiple spinal lytic lesions,left rib fracture on ninth and 10th, cord compression.He finished 10 sessions of radiation therapy.He was discharged to [facility] 8/26 where he received his first dose of chemotherapy. On 9/11 he was re-admitted to [facility] with fevers and encephalopathy. CT showed small ICH. Blood cultures grew MSSA. He had been improving and ICH had been resolving on imaging. In the late PM hours of 9/14 he became lethargic and unresponsive. CT head showed improvement of bleed.   Today is day 11 of this hospitalization and unfortunately patient is not improving as hoped with current  medical interventions.  He awaits lung biopsy results and input from oncology.  He is weak, lethargic and failing to thrive  Patient and family face treatment option decisions, advanced directive decisions and anticipatory care needs   Clinical Assessment and Goals of Care:  This NP Trevor Shelton reviewed medical records, received report from team, assessed the patient and then meet at the patient's bedside along with his wife and Spanish interpreter  to discuss diagnosis, prognosis, GOC, EOL wishes disposition and options.   Concept of Palliative Care was introduced as specialized medical care for people and their families living with serious illness.  If focuses on providing relief from the  symptoms and stress of a serious illness.  The goal is to improve quality of life for both the patient and the family.    Created space and opportunity for patient and his wife to explore their thoughts and feelings regarding current medical situation. Patient verbalizes his understanding of the seriousness of his situation it is hard for him to understand "how this happened so fast"   He worries that his wife does not understand however she is tearful and indeed does verbalize the seriousness and fear of long term prognosis.    A  discussion was had today regarding advanced directives.  Concepts specific to code status, artifical feeding and hydration, continued IV antibiotics and rehospitalization was had.  The difference between a aggressive medical intervention path  and a palliative comfort care path for this patient at this time was had.    Values and goals of care important to patient and family were attempted to be elicited.     Natural trajectory and expectations at EOL were discussed.  Education regarding hospice benefit at home was detailed.  Questions and concerns addressed.  Patient  encouraged to call with questions or concerns.     PMT will continue to support holistically.    Meeting is planned for Monday afternoon at  2pm unless plan changes before then, interpreter secured.       No HPOA  Or AD documented     SUMMARY OF RECOMMENDATIONS    Code Status/Advance Care Planning:  DNR/DNI  Currently patient is open to all offered and available medical interventions to prolong life.    Symptom Management:   Generalized pain: Tylenol scheduled 650 mg po every 6 hrs -hoping to control pain with minimal opioids to prevent further lethargy)  Continue with Morphine 2 mg IV every 4 hors prn, consider orals agents both ER and IR  Re-evaluate efficacy and make adjustments    Palliative Prophylaxis:   Aspiration, Bowel Regimen, Delirium Protocol, Frequent Pain Assessment and  Oral Care  Additional Recommendations (Limitations, Scope, Preferences):  Full Scope Treatment  Psycho-social/Spiritual:   Desire for further Chaplaincy support:yes  Additional Recommendations: Education on Hospice  Prognosis:  Prognosis will depend on viable treatment options offered from oncology however I shared with patient the likley poor prognosis 2/2 to his total picture/co-morbid ites; immobility, high risk for infection, malnutrion   [name] a sense of knowing this to be true for himself too.  Understandably this is very difficult for he and his family.  Discharge Planning: Education offered regarding hospice benefit   To Be Determined      Primary Diagnoses: Present on Admission: . (Resolved) Fever and neutropenia ([place]) . AKI (acute kidney injury) ([place]) . Constipation . Hypertension . Hyponatremia . Incomplete paraplegia ([place]) . Pancytopenia ([place]) . Pathologic fracture . Intracerebral hemorrhage . MSSA bacteremia . Pneumonia   I have reviewed the medical record, interviewed the patient and family, and examined the patient. The following aspects are pertinent.  Past Medical History:  Diagnosis Date  . AKI (acute kidney injury) ([place]) 02/2020  . Hypercalcemia 02/2020  . Hypercholesteremia   . Hypertension    Social History   Socioeconomic History  . Marital status: Married    Spouse name: Not on file  . Number of children: Not on file  . Years of education: Not on file  . Highest education level: Not on file  Occupational History  . Not on file  Tobacco Use  . Smoking status: Never Smoker  . Smokeless tobacco: Never Used  Vaping Use  . Vaping Use: Never used  Substance and Sexual Activity  . Alcohol use: Not Currently  . Drug use: Not Currently  . Sexual activity: Not Currently  Other Topics Concern  . Not on file  Social History Narrative  . Not on file   Social Determinants of Health   Financial Resource Strain:   .  Difficulty of Paying Living Expenses: Not on file  Food Insecurity:   . Worried About Charity fundraiser in the Last Year: Not on file  . Ran Out of Food in the Last Year: Not on file  Transportation Needs:   . Lack of Transportation (Medical): Not on file  . Lack of Transportation (Non-Medical): Not on file  Physical Activity:   . Days of Exercise per Week: Not on file  . Minutes of Exercise per Session: Not on file  Stress:   . Feeling of Stress : Not on file  Social Connections:   . Frequency of Communication with Friends and Family: Not on file  . Frequency of Social Gatherings with Friends and Family: Not on file  . Attends Religious Services: Not on file  . Active Member of Clubs or Organizations: Not on file  . Attends Club or  Organization Meetings: Not on file  . Marital Status: Not on file   Family History  Problem Relation Age of Onset  . Alzheimer's disease Mother   . Diabetes Father   . Diabetes Brother    Scheduled Meds: . (feeding supplement) PROSource Plus  30 mL Oral BID BM  . sodium chloride   Intravenous Once  . sodium chloride   Intravenous Once  . acyclovir  400 mg Oral Daily  . carvedilol  3.125 mg Oral BID WC  . dexamethasone (DECADRON) injection  2 mg Intravenous QODAY  . insulin aspart  0-9 Units Subcutaneous TID WC  . insulin aspart  3 Units Subcutaneous TID WC  . insulin glargine  15 Units Subcutaneous QHS  . lactulose  20 g Oral TID  . levETIRAcetam  500 mg Oral BID  . lidocaine  2 patch Transdermal Q24H  . melatonin  3 mg Oral QHS  . midodrine  5 mg Oral BID WC  . pantoprazole  40 mg Oral BID AC  . Ensure Max Protein  11 oz Oral BID BM  . QUEtiapine  12.5 mg Oral QHS  . senna-docusate  2 tablet Oral BID  . sodium chloride  2 spray Each Nare Q1H while awake  . sucralfate  1 g Oral TID WC & HS   Continuous Infusions: . sodium chloride Stopped (03/28/20 0000)  .  ceFAZolin (ANCEF) IV 2 g (03/31/20 0526)   PRN Meds:.sodium chloride,  acetaminophen, acetaminophen, albuterol, alum & mag hydroxide-simeth, bisacodyl, diphenhydrAMINE, guaiFENesin-dextromethorphan, lidocaine, lidocaine, magic mouthwash w/lidocaine, morphine injection, ondansetron **OR** ondansetron (ZOFRAN) IV, ondansetron, oxymetazoline, polyethylene glycol, prochlorperazine **OR** [DISCONTINUED] prochlorperazine **OR** prochlorperazine, simethicone, sodium bicarbonate/sodium chloride, sorbitol, white petrolatum Medications Prior to Admission:  Prior to Admission medications   Medication Sig Start Date End Date Taking? Authorizing Provider  loratadine (CLARITIN) 10 MG tablet Take 10 mg by mouth daily.   Yes [provider]  meloxicam (MOBIC) 15 MG tablet Take 15 mg by mouth daily as needed for pain.   Yes [provider]  simvastatin (ZOCOR) 20 MG tablet Take 20 mg by mouth daily. 02/01/20  Yes [provider]  terazosin (HYTRIN) 1 MG capsule Take 1 mg by mouth at bedtime.   Yes [provider]  amLODipine (NORVASC) 10 MG tablet Take 1 tablet (10 mg total) by mouth daily. Patient not taking: Reported on 03/21/2020 03/04/20   Shelly Coss, MD  carvedilol (COREG) 3.125 MG tablet Take 1 tablet (3.125 mg total) by mouth 2 (two) times daily with a meal. Patient not taking: Reported on 03/21/2020 03/03/20   Shelly Coss, MD  dexamethasone (DECADRON) 4 MG tablet Take 1 tablet (4 mg total) by mouth 3 (three) times daily. Patient not taking: Reported on 03/21/2020 03/03/20 03/03/21  Shelly Coss, MD  feeding supplement, ENSURE ENLIVE, (ENSURE ENLIVE) LIQD Take 237 mLs by mouth 3 (three) times daily between meals. Patient not taking: Reported on 03/21/2020 03/03/20   Shelly Coss, MD  insulin aspart (NOVOLOG) 100 UNIT/ML injection Inject 0-9 Units into the skin 3 (three) times daily with meals. Patient not taking: Reported on 03/21/2020 03/03/20   Shelly Coss, MD  insulin aspart (NOVOLOG) 100 UNIT/ML injection Inject 3 Units into the  skin 3 (three) times daily with meals. Patient not taking: Reported on 03/21/2020 03/03/20   Shelly Coss, MD  insulin glargine (LANTUS) 100 UNIT/ML injection Inject 0.1 mLs (10 Units total) into the skin at bedtime. Patient not taking: Reported on 03/21/2020 03/03/20   Shelly Coss,  MD  lidocaine (LIDODERM) 5 % Place 1 patch onto the skin daily. Remove & Discard patch within 12 hours or as directed by MD Patient not taking: Reported on 03/21/2020 03/03/20   Shelly Coss, MD  lidocaine (XYLOCAINE) 2 % solution Use as directed 15 mLs in the mouth or throat every 6 (six) hours as needed for mouth pain. Patient not taking: Reported on 03/21/2020 03/03/20   Shelly Coss, MD  oxyCODONE (OXY IR/ROXICODONE) 5 MG immediate release tablet Take 1 tablet (5 mg total) by mouth every 4 (four) hours as needed for moderate pain. Patient not taking: Reported on 03/21/2020 03/03/20   Shelly Coss, MD  pantoprazole (PROTONIX) 40 MG tablet Take 1 tablet (40 mg total) by mouth daily. Patient not taking: Reported on 03/21/2020 03/04/20   Shelly Coss, MD  polyethylene glycol (MIRALAX / GLYCOLAX) 17 g packet Take 17 g by mouth daily as needed for moderate constipation. Patient not taking: Reported on 03/21/2020 03/03/20   Shelly Coss, MD   Allergies  Allergen Reactions  . Other     pollen  . Penicillins     unknown   Review of Systems  Respiratory: Positive for shortness of breath.   Neurological: Positive for weakness.    Physical Exam Constitutional:      Appearance: He is underweight. He is ill-appearing.     Interventions: Nasal cannula in place.  Cardiovascular:     Rate and Rhythm: Tachycardia present.  Musculoskeletal:     Comments: -generlized weakness,  BLE weakness  Skin:    General: Skin is warm and dry.  Neurological:     Mental Status: He is alert.     Vital Signs: BP 114/71 (BP Location: Right Arm)   Pulse 99   Temp 97.9 F (36.6 C) (Oral)   Resp (!) 27   Wt 59.6 kg    SpO2 100%   BMI 23.26 kg/m  Pain Scale: 0-10 POSS *See Group Information*: 1-Acceptable,Awake and alert Pain Score: Asleep   SpO2: SpO2: 100 % O2 Device:SpO2: 100 % O2 Flow Rate: .O2 Flow Rate (L/min): 3 L/min  IO: Intake/output summary:   Intake/Output Summary (Last 24 hours) at 03/31/2020 0854 Last data filed at 03/30/2020 2000 Gross per 24 hour  Intake 360 ml  Output 1000 ml  Net -640 ml    LBM: Last BM Date: 03/30/20 Baseline Weight: Weight: 57.2 kg Most recent weight: Weight: 59.6 kg     Palliative Assessment/Data: 30% at best   Discussed with Dr Starla Link  Time In: 1100 Time Out: 1215 Time Total: 75 minutes Greater than 50%  of this time was spent counseling and coordinating care related to the above assessment and plan.  Signed by: Trevor Lessen, NP   Please contact Palliative Medicine Team phone at [phone] for questions and concerns.  For individual provider: See Shea Evans

## 2020-04-01 DIAGNOSIS — R52 Pain, unspecified: Secondary | ICD-10-CM

## 2020-04-01 LAB — CBC WITH DIFFERENTIAL/PLATELET
Abs Immature Granulocytes: 0.18 10*3/uL — ABNORMAL HIGH (ref 0.00–0.07)
Basophils Absolute: 0 10*3/uL (ref 0.0–0.1)
Basophils Relative: 0 %
Eosinophils Absolute: 0 10*3/uL (ref 0.0–0.5)
Eosinophils Relative: 0 %
HCT: 21.8 % — ABNORMAL LOW (ref 39.0–52.0)
Hemoglobin: 7.4 g/dL — ABNORMAL LOW (ref 13.0–17.0)
Immature Granulocytes: 5 %
Lymphocytes Relative: 9 %
Lymphs Abs: 0.3 10*3/uL — ABNORMAL LOW (ref 0.7–4.0)
MCH: 29.5 pg (ref 26.0–34.0)
MCHC: 33.9 g/dL (ref 30.0–36.0)
MCV: 86.9 fL (ref 80.0–100.0)
Monocytes Absolute: 0.2 10*3/uL (ref 0.1–1.0)
Monocytes Relative: 6 %
Neutro Abs: 3 10*3/uL (ref 1.7–7.7)
Neutrophils Relative %: 80 %
Platelets: 101 10*3/uL — ABNORMAL LOW (ref 150–400)
RBC: 2.51 MIL/uL — ABNORMAL LOW (ref 4.22–5.81)
RDW: 13.4 % (ref 11.5–15.5)
WBC: 3.7 10*3/uL — ABNORMAL LOW (ref 4.0–10.5)
nRBC: 0.8 % — ABNORMAL HIGH (ref 0.0–0.2)

## 2020-04-01 LAB — GLUCOSE, CAPILLARY
Glucose-Capillary: 158 mg/dL — ABNORMAL HIGH (ref 70–99)
Glucose-Capillary: 229 mg/dL — ABNORMAL HIGH (ref 70–99)
Glucose-Capillary: 220 mg/dL — ABNORMAL HIGH (ref 70–99)
Glucose-Capillary: 172 mg/dL — ABNORMAL HIGH (ref 70–99)

## 2020-04-01 LAB — BASIC METABOLIC PANEL
Anion gap: 10 (ref 5–15)
BUN: 28 mg/dL — ABNORMAL HIGH (ref 6–20)
CO2: 26 mmol/L (ref 22–32)
Calcium: 9.3 mg/dL (ref 8.9–10.3)
Chloride: 89 mmol/L — ABNORMAL LOW (ref 98–111)
Creatinine, Ser: 0.73 mg/dL (ref 0.61–1.24)
GFR calc Af Amer: >60 mL/min (ref >60)
GFR calc non Af Amer: >60 mL/min (ref >60)
Glucose, Bld: 181 mg/dL — ABNORMAL HIGH (ref 70–99)
Potassium: 3.3 mmol/L — ABNORMAL LOW (ref 3.5–5.1)
Sodium: 125 mmol/L — ABNORMAL LOW (ref 135–145)

## 2020-04-01 LAB — MAGNESIUM: Magnesium: 1.6 mg/dL — ABNORMAL LOW (ref 1.7–2.4)

## 2020-04-01 MED ORDER — MIDODRINE HCL 5 MG PO TABS
2.5000 mg | ORAL_TABLET | Freq: Every day | ORAL | Status: DC
  Administered 2020-04-02 – 2020-04-03 (×2): 2.5 mg via ORAL
  Filled 2020-04-01 (×2): qty 1

## 2020-04-01 MED ORDER — ZOLPIDEM TARTRATE 5 MG PO TABS
5.0000 mg | ORAL_TABLET | Freq: Every evening | ORAL | Status: AC | PRN
  Administered 2020-04-01: 5 mg via ORAL
  Filled 2020-04-01: qty 1

## 2020-04-01 MED ORDER — MORPHINE SULFATE ER 15 MG PO TBCR
15.0000 mg | EXTENDED_RELEASE_TABLET | Freq: Two times a day (BID) | ORAL | Status: DC
  Administered 2020-04-01 – 2020-04-03 (×4): 15 mg via ORAL
  Filled 2020-04-01 (×4): qty 1

## 2020-04-01 NOTE — Progress Notes (Signed)
Physical Therapy Treatment Patient Details Name: Trevor Shelton MRN: [id number] DOB: 08/26/1965 Today's Date: 04/01/2020    History of Present Illness 54 yo male with PMH of HTN and HLD.  Pt with recent admission 02/14/20-03/03/20 with neoplasm consistent with multiple myeloma with multiple spinal lesions, L rib fx 9 and 10, and cord compression.  Pt on Decadron for cord compression.  Pt was at inpt rehab and developed nose bleed and fever. Has been admitted with fever, multiple myeloma, AMS, AKI, and pancytopenia.  Pt found to have small intracrainal hemorrhage and severe sepsis with PNA 9/14 rapid response called transfer to ICU sleep cycle disturbed with visaul hallunications TEE cancelled EEG moderate diffuse encephalopathy    PT Comments    Pt on bedpan on entry, he is alert and looks much better than last session. Pt reports he is finished and is able to roll to side with modA for bed pan removal, pericare and placement of clean foam dressing over sacral wound. Pt requires maxAx2 to sit EoB with assist for initial balance. Pt able to progress to static sitting with bilateral UE support and no outside assist. Pt agreeable to transfer to chair, pt requires total A for lateral scoot to drop arm recliner. By time pt is in chair, his face is white and he is obvious 8/10 pain but he down plays amount of pain. Discussed use of pain medication prior to treatment for future sessions. Pt reluctantly agreeable. Requested air mattress for off weighting sacral wound in supine. D/c plans remain appropriate at this time. PT will continue to follow acutely.    Follow Up Recommendations  SNF     Equipment Recommendations  Wheelchair (measurements PT);Wheelchair cushion (measurements PT);3in1 (PT)    Recommendations for Other Services Rehab consult     Precautions / Restrictions Precautions Precautions: Fall Precaution Comments: Significant BLE weakness and ataxia-like movements; back and rib precautions for  comfort due to fxs Restrictions Weight Bearing Restrictions: No    Mobility  Bed Mobility Overal bed mobility: Needs Assistance Bed Mobility: Rolling;Sidelying to Sit Rolling: Mod assist Sidelying to sit: +2 for physical assistance;Max assist     Sit to sidelying: Min assist General bed mobility comments: Pt on bedpan on entry requiring modA for rolling on R side, maxAx2 for bringing LE off bed and trunk to upright   Transfers Overall transfer level: Needs assistance Equipment used: 2 person hand held assist Transfers: Lateral/Scoot Transfers          Lateral/Scoot Transfers: Total assist;+2 physical assistance General transfer comment: requrires total (A) with blocking bil LE; continues to lack strength for hip and knee extension      Modified Rankin (Stroke Patients Only) Modified Rankin (Stroke Patients Only) Pre-Morbid Rankin Score: Severe disability Modified Rankin: Severe disability     Balance Overall balance assessment: Needs assistance Sitting-balance support: Bilateral upper extremity supported;Feet supported Sitting balance-Leahy Scale: Poor Sitting balance - Comments: requires UE support, initially requires outside assist    Standing balance support: Bilateral upper extremity supported Standing balance-Leahy Scale: Zero Standing balance comment: Total A of 2 for stand pivot to chair                            Cognition Arousal/Alertness: Awake/alert Behavior During Therapy: WFL for tasks assessed/performed Overall Cognitive Status: Impaired/Different from baseline Area of Impairment: Attention ( )  Current Attention Level: Alternating   Following Commands: Follows multi-step commands with increased time       General Comments: In-house interpreter Graciela utilized for session, Pt and family continues to have difficulty understanding pt condition       Exercises General Exercises - Lower Extremity Ankle  Circles/Pumps: AROM;10 reps;Both Long Arc Quad: Both;10 reps;Seated    General Comments        Pertinent Vitals/Pain Pain Assessment: Faces Faces Pain Scale: Hurts whole lot Pain Location: back, chest, and shoulders Pain Descriptors / Indicators: Discomfort;Grimacing Pain Intervention(s): Limited activity within patient's tolerance;Monitored during session;Repositioned           PT Goals (current goals can now be found in the care plan section) Acute Rehab PT Goals Patient Stated Goal: to get to chair  PT Goal Formulation: With patient/family Time For Goal Achievement: 04/04/20 Potential to Achieve Goals: Fair Progress towards PT goals: Progressing toward goals (slow and limited by pain )    Frequency    Min 2X/week      PT Plan Discharge plan needs to be updated;Frequency needs to be updated       AM-PAC PT "6 Clicks" Mobility   Outcome Measure  Help needed turning from your back to your side while in a flat bed without using bedrails?: A Lot Help needed moving from lying on your back to sitting on the side of a flat bed without using bedrails?: A Lot Help needed moving to and from a bed to a chair (including a wheelchair)?: Total Help needed standing up from a chair using your arms (e.g., wheelchair or bedside chair)?: Total Help needed to walk in hospital room?: Total Help needed climbing 3-5 steps with a railing? : Total 6 Click Score: 8    End of Session Equipment Utilized During Treatment: Gait belt Activity Tolerance: Patient limited by pain Patient left: in chair;with call bell/phone within reach;with chair alarm set;with family/visitor present Nurse Communication: Mobility status;Need for lift equipment (pt can only tolerate sitting approx 1 hour) PT Visit Diagnosis: Muscle weakness (generalized) (M62.81);Difficulty in walking, not elsewhere classified (R26.2);Other abnormalities of gait and mobility (R26.89) Pain - part of body: Shoulder;Leg (back)      Time: 2878-6767 PT Time Calculation (min) (ACUTE ONLY): 27 min  Charges:  $Therapeutic Activity: 23-37 mins                     Trevor Patil B. Migdalia Dk PT, DPT Acute Rehabilitation Services Pager [phone] Office [phone]    [place] 04/01/2020, 4:51 PM

## 2020-04-01 NOTE — Progress Notes (Signed)
[place] [phone]   [phone]  PROGRESS NOTE  Patient Care Team: Patient, No Pcp Per as PCP - General (General Practice)  Hematological/Oncological History # Lytic Lesions Throughout Skeleton/Hypercalcemia # Pathologic Fractures of Spine/Soft Tissue Masses in Spine # Multiple Myeloma Confirmed by Bone Marrow Biopsy 1) 02/14/2020: presented to the emergency department with markedly worsening back pain. 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 The T6 lesion severely narrows the thecal sac and deforms the spinal cord. 3) 02/18/2020: establish care with Dr. Lorenso Courier   HISTORY OF PRESENTING ILLNESS:  Trevor Shelton 54 y.o. male with medical history significant for HTN and HLD who presented with back pain and was found to have cord compression and lytic lesions consistent with multiple myeloma.   Interval History: --on antibiotics for MSSA bacteremia. Continuing Ancef until 04/04/2020 --neurological status stabilized --lung lesion biopsied, found to be blood/debris without carcinoma --Platelet count and hemoglobin stable --Work with physical therapy today --Still reports left-sided chest pain; worse with movement  MEDICAL HISTORY:  Past Medical History:  Diagnosis Date  . AKI (acute kidney injury) ([place]) 02/2020  . Hypercalcemia 02/2020  . Hypercholesteremia   . Hypertension     SURGICAL HISTORY: Past Surgical History:  Procedure Laterality Date  . HERNIA REPAIR    . IR FLUORO GUIDE CV LINE RIGHT  02/17/2020  . IR REMOVAL TUN CV CATH W/O FL  03/01/2020  . IR US GUIDE VASC ACCESS RIGHT  02/17/2020    SOCIAL HISTORY: Social History   Socioeconomic History  . Marital status: Married    Spouse name: Not on file  . Number of children: Not on file  . Years of education: Not on file  . Highest education level:  Not on file  Occupational History  . Not on file  Tobacco Use  . Smoking status: Never Smoker  . Smokeless tobacco: Never Used  Vaping Use  . Vaping Use: Never used  Substance and Sexual Activity  . Alcohol use: Not Currently  . Drug use: Not Currently  . Sexual activity: Not Currently  Other Topics Concern  . Not on file  Social History Narrative  . Not on file   Social Determinants of Health   Financial Resource Strain:   . Difficulty of Paying Living Expenses: Not on file  Food Insecurity:   . Worried About Charity fundraiser in the Last Year: Not on file  . Ran Out of Food in the Last Year: Not on file  Transportation Needs:   . Lack of Transportation (Medical): Not on file  . Lack of Transportation (Non-Medical): Not on file  Physical Activity:   . Days of Exercise per Week: Not on file  . Minutes of Exercise per Session: Not on file  Stress:   . Feeling of Stress : Not on file  Social Connections:   . Frequency of Communication with Friends and Family: Not on file  . Frequency of Social Gatherings with Friends and Family: Not on file  . Attends Religious Services: Not on file  . Active Member of Clubs or Organizations: Not on file  . Attends Archivist Meetings: Not on file  . Marital Status: Not on file  Intimate Partner Violence:   . Fear of Current or Ex-Partner: Not on file  . Emotionally Abused: Not on file  . Physically Abused: Not on file  .  Sexually Abused: Not on file    FAMILY HISTORY: Family History  Problem Relation Age of Onset  . Alzheimer's disease Mother   . Diabetes Father   . Diabetes Brother     ALLERGIES:  is allergic to other and penicillins.  MEDICATIONS:  Current Facility-Administered Medications  Medication Dose Route Frequency Provider Last Rate Last Admin  . (feeding supplement) PROSource Plus liquid 30 mL  30 mL Oral BID BM Li, Na, MD   30 mL at 04/01/20 1605  . 0.9 %  sodium chloride infusion   Intravenous PRN  Domenic Polite, MD 10 mL/hr at 03/31/20 2107 Restarted at 03/31/20 2107  . acetaminophen (TYLENOL) tablet 650 mg  650 mg Oral Q6H Knox Royalty, NP   650 mg at 04/01/20 1604  . acyclovir (ZOVIRAX) 200 MG capsule 400 mg  400 mg Oral Daily Nicoletta Dress, Na, MD   400 mg at 04/01/20 0856  . albuterol (PROVENTIL) (2.5 MG/3ML) 0.083% nebulizer solution 2.5 mg  2.5 mg Nebulization TID PRN Kc, Maren Beach, MD      . alum & mag hydroxide-simeth (MAALOX/MYLANTA) 200-200-20 MG/5ML suspension 30 mL  30 mL Oral Q4H PRN Nicoletta Dress, Na, MD   30 mL at 03/28/20 2051  . bisacodyl (DULCOLAX) suppository 10 mg  10 mg Rectal Daily PRN Nicoletta Dress, Na, MD      . carvedilol (COREG) tablet 3.125 mg  3.125 mg Oral BID WC Li, Na, MD   3.125 mg at 04/01/20 1604  . ceFAZolin (ANCEF) IVPB 2g/100 mL premix  2 g Intravenous Q8H Einar Grad, RPH 200 mL/hr at 04/01/20 1326 2 g at 04/01/20 1326  . dexamethasone (DECADRON) injection 2 mg  2 mg Intravenous Janeece Agee R, NP   2 mg at 03/31/20 0910  . diphenhydrAMINE (BENADRYL) 12.5 MG/5ML elixir 12.5-25 mg  12.5-25 mg Oral Q6H PRN Nicoletta Dress, Na, MD      . furosemide (LASIX) injection 40 mg  40 mg Intravenous Q12H Alekh, Kshitiz, MD   40 mg at 04/01/20 1032  . guaiFENesin-dextromethorphan (ROBITUSSIN DM) 100-10 MG/5ML syrup 5-10 mL  5-10 mL Oral Q6H PRN Li, Na, MD      . insulin aspart (novoLOG) injection 0-9 Units  0-9 Units Subcutaneous TID WC Nicoletta Dress, Na, MD   3 Units at 04/01/20 1232  . insulin aspart (novoLOG) injection 3 Units  3 Units Subcutaneous TID WC Nicoletta Dress, Na, MD   3 Units at 04/01/20 1231  . insulin glargine (LANTUS) injection 15 Units  15 Units Subcutaneous QHS Antonieta Pert, MD   15 Units at 03/31/20 2119  . lactulose (CHRONULAC) 10 GM/15ML solution 20 g  20 g Oral TID Domenic Polite, MD   20 g at 03/27/20 2059  . levETIRAcetam (KEPPRA) tablet 500 mg  500 mg Oral BID Garvin Fila, MD   500 mg at 04/01/20 0857  . lidocaine (LIDODERM) 5 % 2 patch  2 patch Transdermal Q24H Charlann Lange, MD   2 patch at  04/01/20 0856  . lidocaine (XYLOCAINE) 2 % jelly   Topical PRN Li, Na, MD      . lidocaine (XYLOCAINE) 2 % viscous mouth solution 15 mL  15 mL Mouth/Throat Q6H PRN Nicoletta Dress, Na, MD      . magic mouthwash w/lidocaine  5 mL Oral QID PRN Nicoletta Dress, Na, MD      . melatonin tablet 3 mg  3 mg Oral QHS Kc, Ramesh, MD   3 mg at 03/31/20 2119  . [START ON 04/02/2020] midodrine (PROAMATINE)  tablet 2.5 mg  2.5 mg Oral Daily Alekh, Kshitiz, MD      . morphine (MS CONTIN) 12 hr tablet 15 mg  15 mg Oral X10G Vinie Sill C, NP      . morphine 2 MG/ML injection 2 mg  2 mg Intravenous Q4H PRN Shela Leff, MD   2 mg at 04/01/20 1033  . ondansetron (ZOFRAN) tablet 4 mg  4 mg Oral Q6H PRN Nicoletta Dress, Na, MD       Or  . ondansetron (ZOFRAN) injection 4 mg  4 mg Intravenous Q6H PRN Nicoletta Dress, Na, MD      . ondansetron (ZOFRAN) tablet 4 mg  4 mg Oral Q6H PRN Nicoletta Dress, Na, MD      . oxymetazoline (AFRIN) 0.05 % nasal spray 1 spray  1 spray Each Nare BID PRN Antonieta Pert, MD   1 spray at 03/26/20 1851  . pantoprazole (PROTONIX) EC tablet 40 mg  40 mg Oral BID AC Li, Na, MD   40 mg at 04/01/20 1604  . polyethylene glycol (MIRALAX / GLYCOLAX) packet 17 g  17 g Oral Daily PRN Nicoletta Dress, Na, MD      . prochlorperazine (COMPAZINE) tablet 5-10 mg  5-10 mg Oral Q6H PRN Nicoletta Dress, Na, MD       Or  . prochlorperazine (COMPAZINE) suppository 12.5 mg  12.5 mg Rectal Q6H PRN Nicoletta Dress, Na, MD      . protein supplement (ENSURE MAX) liquid  11 oz Oral BID BM Domenic Polite, MD   11 oz at 04/01/20 1605  . QUEtiapine (SEROQUEL) tablet 12.5 mg  12.5 mg Oral QHS Garvin Fila, MD   12.5 mg at 03/31/20 2119  . senna-docusate (Senokot-S) tablet 2 tablet  2 tablet Oral BID Charlann Lange, MD   2 tablet at 04/01/20 0856  . simethicone (MYLICON) chewable tablet 80 mg  80 mg Oral QID PRN Nicoletta Dress, Na, MD   80 mg at 03/23/20 1151  . sodium bicarbonate/sodium chloride mouthwash 1047m   Mouth Rinse PRN LNicoletta Dress Na, MD      . sodium chloride (OCEAN) 0.65 % nasal spray 2 spray  2 spray Each Nare Q1H while  awake LNicoletta Dress Na, MD   2 spray at 04/01/20 1618  . sorbitol 70 % solution 30 mL  30 mL Oral Daily PRN LNicoletta Dress Na, MD      . sucralfate (CARAFATE) 1 GM/10ML suspension 1 g  1 g Oral TID WC & HS Li, Na, MD   1 g at 04/01/20 1604  . white petrolatum (VASELINE) gel   Topical PRN LCharlann Lange MD   0.2 application at [id number]/[id number]    REVIEW OF SYSTEMS:   Constitutional: ( - ) fevers, ( - )  chills , ( - ) night sweats Eyes: ( - ) blurriness of vision, ( - ) double vision, ( - ) watery eyes Ears, nose, mouth, throat, and face: (-) Epistaxis ( - ) mucositis, ( - ) sore throat Respiratory: ( - ) cough, ( - ) dyspnea, ( - ) wheezes Cardiovascular: ( - ) palpitation, ( - ) chest discomfort, ( - ) lower extremity swelling Gastrointestinal:  ( - ) nausea, ( - ) heartburn, ( - ) change in bowel habits Skin: ( - ) abnormal skin rashes Lymphatics: ( - ) new lymphadenopathy, ( - ) easy bruising Neurological: ( - ) numbness, ( - ) tingling, ( - ) new weaknesses Behavioral/Psych: ( - ) mood change, ( - ) new changes  All  other systems were reviewed with the patient and are negative.  PHYSICAL EXAMINATION:  GENERAL: Chronically ill-appearing male, laying in bed SKIN: skin color, texture, turgor are normal, no rashes or significant lesions OROPHARYNX: Small, dried blisters on bottom lip which continue to improve EYES: conjunctiva are pink and non-injected, sclera non-injected LUNGS: Diminished breath sounds HEART: Regular rate and rhythm ABDOMEN: Positive bowel sounds, distended Musculoskeletal: no cyanosis of digits and no clubbing.  5/5 strength in the right lower extremity and 3/5 strength in the left lower extremity. PSYCH: alert & oriented x 3, fluent speech  LABORATORY DATA:  I have reviewed the data as listed CBC Latest Ref Rng & Units 04/01/2020 03/31/2020 03/30/2020  WBC 4.0 - 10.5 K/uL 3.7(L) 3.9(L) 4.3  Hemoglobin 13.0 - 17.0 g/dL 7.4(L) 7.4(L) 7.9(L)  Hematocrit 39 - 52 % 21.8(L) 22.4(L) 23.2(L)    Platelets 150 - 400 K/uL 101(L) 114(L) 141(L)    CMP Latest Ref Rng & Units 04/01/2020 03/31/2020 03/30/2020  Glucose 70 - 99 mg/dL 181(H) 134(H) 215(H)  BUN 6 - 20 mg/dL 28(H) 19 23(H)  Creatinine 0.61 - 1.24 mg/dL 0.73 0.63 0.77  Sodium 135 - 145 mmol/L 125(L) 126(L) 129(L)  Potassium 3.5 - 5.1 mmol/L 3.3(L) 3.7 4.2  Chloride 98 - 111 mmol/L 89(L) 90(L) 92(L)  CO2 22 - 32 mmol/L _0 Calcium 8.9 - 10.3 mg/dL 9.3 9.1 9.1  Total Protein 6.5 - 8.1 g/dL - - -  Total Bilirubin 0.3 - 1.2 mg/dL - - -  Alkaline Phos 38 - 126 U/L - - -  AST 15 - 41 U/L - - -  ALT 0 - 44 U/L - - -     RADIOGRAPHIC STUDIES: I have personally reviewed the radiological images as listed and agreed with the findings in the report pronounced lytic lesions and cord compression in thoracic spine.   EEG  Result Date: 03/22/2020 Trevor Havens, MD     03/22/2020  1:52 PM Patient Name: Trevor Shelton MRN: [id number] Epilepsy Attending: Lora Shelton Referring Physician/Provider: Dr Karena Addison Aroor Date: 03/22/2020 Duration: 23.25 mins Patient history: 54yo m with AMS. CTH showed right frontal mass concerning for mets. EEG to evaluate for seizure. Level of alertness: awake AEDs during EEG study: LEV Technical aspects: This EEG study was done with scalp electrodes positioned according to the 10-20 International system of electrode placement. Electrical activity was acquired at a sampling rate of _1  and reviewed with a high frequency filter of _2  and a low frequency filter of _3 . EEG data were recorded continuously and digitally stored. Description: No posterior dominant rhythm was seen. EEG showed continuous generalized 3 to 6 Hz theta-delta slowing. Hyperventilation and photic stimulation were not performed.   ABNORMALITY -Continuous slow, generalized IMPRESSION: This study is suggestive of moderate diffuse encephalopathy, nonspecific etiology. No seizures or epileptiform discharges were seen throughout the recording.  Priyanka Barbra Sarks   CT ABDOMEN PELVIS WO CONTRAST  Result Date: 03/24/2020 CLINICAL DATA:  Abdominal pain, abdominal distension EXAM: CT ABDOMEN AND PELVIS WITHOUT CONTRAST TECHNIQUE: Multidetector CT imaging of the abdomen and pelvis was performed following the standard protocol without IV contrast. Oral enteric contrast was administered. COMPARISON:  CT chest abdomen pelvis, 03/20/2020 FINDINGS: Lower chest: Dependent bibasilar atelectasis and/or consolidation with small bilateral pleural effusions, similar to prior examination. Hepatobiliary: No solid liver abnormality is seen. No gallstones, gallbladder wall thickening, or biliary dilatation. Pancreas: Unremarkable. No pancreatic ductal dilatation or surrounding inflammatory changes. Spleen: Normal in size without significant  abnormality. Adrenals/Urinary Tract: Adrenal glands are unremarkable. Kidneys are normal, without renal calculi, solid lesion, or hydronephrosis. Bladder is unremarkable. Stomach/Bowel: Stomach is within normal limits. The small bowel and colon are diffusely gas distended, gas and stool present to the rectum, and degree of bowel distension somewhat increased compared to prior examination. Largest loops of mid small bowel measure up to 4.4 cm in caliber. The cecum measures up to 9.4 cm. Vascular/Lymphatic: No significant vascular findings are present. No enlarged abdominal or pelvic lymph nodes. Reproductive: No mass or other significant abnormality. Other: Small fat and fluid containing bilateral inguinal hernias. Trace ascites throughout the abdomen and pelvis, new compared to prior examination. Musculoskeletal: There is redemonstrated, diffuse lytic osseous metastatic disease, most notable for a large lytic lesion and associated pathologic fracture of the T9 vertebral body. IMPRESSION: 1. The small bowel and colon are diffusely gas distended, gas and stool present to the rectum, and degree of bowel distension somewhat increased  compared to prior examination. Findings are most consistent with ileus. 2. Trace ascites throughout the abdomen and pelvis, new compared to prior examination. 3. Dependent bibasilar atelectasis and/or consolidation of the included lung bases with small bilateral pleural effusions, similar to prior examination. 4. There is redemonstrated, diffuse lytic osseous metastatic disease, most notable for a large lytic lesion and associated pathologic fracture of the T9 vertebral body. Findings are in keeping with multiple myeloma. Electronically Signed   By: Eddie Candle M.D.   On: 03/24/2020 17:21   DG Chest 2 View  Result Date: 03/31/2020 CLINICAL DATA:  Shortness of breath and fever EXAM: CHEST - 2 VIEW COMPARISON:  February 25, 2020 FINDINGS: There is mild atelectatic change in the left mid lung and bibasilar regions. There is no edema or airspace opacity. Heart is upper normal in size with pulmonary vascularity normal. No adenopathy. No bone lesions. IMPRESSION: Areas of patchy atelectasis bilaterally. No edema or airspace opacity. Cardiac silhouette within normal limits. Electronically Signed   By: Lowella Grip III M.D.   On: 03/22/2020 15:21   DG Abd 1 View  Result Date: 03/13/2020 CLINICAL DATA:  Nausea, vomiting. EXAM: ABDOMEN - 1 VIEW COMPARISON:  March 10, 2020. FINDINGS: The bowel gas pattern is normal. Moderate amount of stool seen in right colon. No radio-opaque calculi or other significant radiographic abnormality are seen. IMPRESSION: Moderate stool burden is noted. No evidence of bowel obstruction or ileus. Electronically Signed   By: Marijo Conception M.D.   On: 03/13/2020 11:31   DG Abd 1 View  Result Date: 03/10/2020 CLINICAL DATA:  Abdominal pain. EXAM: ABDOMEN - 1 VIEW COMPARISON:  Radiograph 03/05/2020. FINDINGS: Mild gaseous distention of large and small bowel in a nonobstructive pattern. Small to moderate stool in the right colon, with diminished stool burden from prior exam. There is  no evidence of free intra-abdominal air. No radiopaque calculi. No osseous abnormalities are seen. IMPRESSION: Bowel-gas pattern suggestive of ileus, with slight improvement in gaseous bowel distension over the past few days. Electronically Signed   By: Keith Rake M.D.   On: 03/10/2020 14:13   DG Abd 1 View  Result Date: 03/05/2020 CLINICAL DATA:  Abdominal pain and bloating. EXAM: ABDOMEN - 1 VIEW COMPARISON:  Abdominal radiograph 03/03/2020, CT 02/14/2020 FINDINGS: No evidence of bowel obstruction on the supine views. Mild gaseous distention of stomach with slight improvement from prior. Mild gaseous small bowel distension and a generalized pattern. Moderate colonic stool burden. No abnormal rectal distention. No radiopaque calculi. No acute  osseous abnormalities are seen. IMPRESSION: Ileus bowel gas pattern with mild gaseous distension of small bowel loops and stomach with slight improvement from recent exam. No evidence of developing obstruction. Moderate colonic stool burden. Electronically Signed   By: Keith Rake M.D.   On: 03/05/2020 15:47   DG Abd 1 View  Result Date: 03/03/2020 CLINICAL DATA:  Cord compression.  Left upper quadrant pain. EXAM: ABDOMEN - 1 VIEW COMPARISON:  CT 02/14/2020. FINDINGS: Distended loops of small and large bowel noted suggesting adynamic ileus. Recent CT of the abdomen pelvis of 02/14/2020 did reveal a right inguinal hernia with herniation of fat. No free air. Bony lesions present best identified by prior CT. IMPRESSION: Distended loops of small and large bowel noted suggesting adynamic ileus. Recent CT of the abdomen pelvis of 02/14/2020 however did reveal a right inguinal hernia with herniation of fat. Follow-up exams suggested to demonstrate resolution of bowel distention in order to exclude bowel obstruction. Electronically Signed   By: Marcello Moores  Register   On: 03/03/2020 13:56   CT HEAD WO CONTRAST  Result Date: 03/22/2020 CLINICAL DATA:  Headache EXAM:  CT HEAD WITHOUT CONTRAST TECHNIQUE: Contiguous axial images were obtained from the base of the skull through the vertex without intravenous contrast. COMPARISON:  Brain MRI 03/20/2020 FINDINGS: Brain: Trace extra-axial hemorrhage beneath the right frontal lobe remains faintly visible. No new site of hemorrhage. No midline shift or other mass effect. Vascular: No hyperdense vessel or unexpected calcification. Skull: Numerous lucent lesions throughout the calvarium and skull base. The largest lesion is at the left clivus. Sinuses/Orbits: No acute finding. Other: None. IMPRESSION: 1. Trace extra-axial hemorrhage beneath the right frontal lobe remains faintly visible. No new site of hemorrhage. 2. Numerous lucent lesions throughout the calvarium and skull base, consistent with known multiple myeloma. Electronically Signed   By: Ulyses Jarred M.D.   On: 03/22/2020 00:12   CT HEAD WO CONTRAST  Result Date: 03/20/2020 CLINICAL DATA:  Stroke, follow-up. EXAM: CT HEAD WITHOUT CONTRAST TECHNIQUE: Contiguous axial images were obtained from the base of the skull through the vertex without intravenous contrast. COMPARISON:  Brain MRI 03/20/2020. CT head 03/20/2020 FINDINGS: Brain: The examination is mild to moderately motion degraded, limiting evaluation. A previously demonstrated foci of intracranial hemorrhage along the anteroinferior right frontal lobe convexity (suspected both intraparenchymal and extra-axial) has decreased in conspicuity (series 5, image 16, image 17). No new site of acute intracranial hemorrhage is identified. There is no significant mass effect.  No midline shift. Vascular: No hyperdense vessel Skull: Redemonstrated are innumerable marrow replacing lesions throughout the calvarium, skull base and visualized upper cervical spine compatible with the known history of multiple myeloma Sinuses/Orbits: No acute orbital abnormality identified. No significant paranasal sinus disease or mastoid effusion.  IMPRESSION: Motion degraded examination. Interval decrease in size and conspicuity of a known focus of intracranial hemorrhage along the anteroinferior right frontal lobe convexity (suspected both intraparenchymal and extra-axial). No new site of acute intracranial hemorrhage is identified. Redemonstrated innumerable marrow replacing lesions throughout the calvarium, skull base and visualized upper cervical spine compatible with the known history of multiple myeloma. Electronically Signed   By: Kellie Simmering DO   On: 03/20/2020 13:39   CT HEAD WO CONTRAST  Result Date: 03/20/2020 CLINICAL DATA:  Initial evaluation for acute altered mental status. EXAM: CT HEAD WITHOUT CONTRAST TECHNIQUE: Contiguous axial images were obtained from the base of the skull through the vertex without intravenous contrast. COMPARISON:  None available. FINDINGS: Brain: Cerebral volume within  normal limits for age. There is a somewhat focal lobulated hyperdensity immediately subjacent to the right frontal calvarium (series 3, image 18) finding also seen on coronal sequence (series 5, images 21-23) and sagittal sequence (series 6, image 23). Lesion measures up to 2.1 cm in greatest dimension on sagittal sequence. While this area is often prone to artifact, finding is suspicious for a small bleed. Finding is favored to be intra-axial in nature, although a small extra-axial component may be present as well. No significant mass effect or edema. No other acute intracranial hemorrhage. No acute large vessel territory infarct. No mass lesion, midline shift, or mass effect. No hydrocephalus. Vascular: No hyperdense vessel. Skull: Scalp soft tissues demonstrate no acute finding. Multiple scattered lucent lesion seen throughout the calvarium and visualized upper cervical spine. Note made of a prominent 1.7 cm lytic lesion within the clivus. Findings are nonspecific, but concerning for possible Mets or myeloma. Sinuses/Orbits: Globes and orbital  soft tissues within normal limits. Paranasal sinuses are clear. Trace left mastoid effusion noted. Other: None. IMPRESSION: 1. Small lobulated hyperdensity immediately subjacent to the right frontal calvarium as above. Highly suspicious for a small bleed. No significant mass effect or edema. 2. Multiple scattered lucent lesions throughout the calvarium and visualized upper cervical spine, consistent with history of multiple myeloma. 3. No other acute intracranial abnormality. Critical Value/emergent results were called by telephone at the time of interpretation on 03/20/2020 at 1:12 am to provider NA LI , who verbally acknowledged these results. Electronically Signed   By: Jeannine Boga M.D.   On: 03/20/2020 01:20   CT ANGIO CHEST PE W OR WO CONTRAST  Result Date: 03/27/2020 CLINICAL DATA:  Chest pain and difficulty breathing. Possible pulmonary embolism. EXAM: CT ANGIOGRAPHY CHEST WITH CONTRAST TECHNIQUE: Multidetector CT imaging of the chest was performed using the standard protocol during bolus administration of intravenous contrast. Multiplanar CT image reconstructions and MIPs were obtained to evaluate the vascular anatomy. CONTRAST:  26m OMNIPAQUE IOHEXOL 350 MG/ML SOLN COMPARISON:  Chest CT 03/20/2020 FINDINGS: Cardiovascular: Mild stable cardiomegaly. Minimal calcified plaque over the left anterior descending and right coronary arteries. Thoracic aorta is normal in caliber. Pulmonary arterial system is well opacified without evidence of emboli. Mediastinum/Nodes: No evidence of mediastinal or hilar adenopathy. Interval development of left axillary adenopathy. Lungs/Pleura: Lungs are adequately inflated and demonstrate slight worsening small bilateral pleural effusions with associated posterior bibasilar atelectasis. Interval worsening of mild patchy bilateral airspace opacification most prominent over the periphery of the right upper lung. Findings may be due to multifocal infection. Airways are  unremarkable. Interval enlargement of pleural based mass over the anterolateral left upper thorax measuring 2.3 x 5.6 cm (previously 1.4 x 5.1 cm). Upper Abdomen: No acute findings. Musculoskeletal: Interval progression of diffuse lytic process throughout the axial and appendicular bony structures compatible with known multiple myeloma. Multiple bilateral rib fractures unchanged. Fracture of the body of the sternum just below the sternomanubrial joint unchanged. Fracture with mild associated soft tissue density/lytic process over the inferior aspect of the right scapula with interval progression. Known pathologic compression fractures T6 and T9 without significant change. All these fracture are likely pathologic secondary to patient's underlying multiple myeloma. Review of the MIP images confirms the above findings. IMPRESSION: 1. No evidence of pulmonary embolism. 2. Interval worsening of mild patchy bilateral airspace process most prominent over the periphery of the right upper lung which may be due to multifocal infection. Slight worsening small bilateral pleural effusions with associated bibasilar atelectasis. 3. Interval  enlargement of a pleural based mass over the anterolateral left upper thorax measuring 2.3 x 5.6 cm (previously 1.4 x 5.1 cm). Interval development of left axillary adenopathy. Findings are likely due to patient's underlying multiple myeloma. 4. Stable cardiomegaly and minimal atherosclerotic coronary artery disease. 5. Diffuse lytic process throughout the axial and appendicular bony structures compatible with patient's known multiple myeloma. Multiple associated spinal compression fractures, rib fractures, sternal fracture and right inferior scapular fracture likely pathologic due to underlying myeloma. Electronically Signed   By: Marin Olp M.D.   On: 03/27/2020 16:06   MR BRAIN W WO CONTRAST  Result Date: 03/20/2020 CLINICAL DATA:  Initial evaluation for brain mass or lesion. EXAM:  MRI HEAD WITHOUT AND WITH CONTRAST TECHNIQUE: Multiplanar, multiecho pulse sequences of the brain and surrounding structures were obtained without and with intravenous contrast. CONTRAST:  42m GADAVIST GADOBUTROL 1 MMOL/ML IV SOLN COMPARISON:  Prior head CT from earlier the same day. FINDINGS: Brain: Cerebral volume within normal limits for age. No significant cerebral white matter disease or other focal parenchymal signal abnormality. Abnormal T1/FLAIR hyperintensity involving the anterior inferior right frontal convexity, consistent with acute intracranial hemorrhage, corresponding with abnormality on prior CT (series 11, images 10-14). This appears to be both intraparenchymal and extra-axial, and is favored to have initially represented a small parenchymal bleed that developed subdural/extra-axial extension. Size of this hemorrhage measures approximately 1.8 cm in greatest dimension on sagittal sequences. No associated edema or significant regional mass effect. No definite underlying lesion. Slight asymmetric smooth dural thickening and enhancement seen elsewhere about the right cerebral convexity, favored to be reactive. Consideration given to possible myelomatous involvement of the dura, although this would be expected to be more nodular and irregular in appearance. No other acute intracranial hemorrhage identified. Single punctate focus of susceptibility artifact noted at the right frontal centrum semi ovale, likely a small chronic microhemorrhage, of doubtful significance in isolation. No evidence for acute or subacute infarct. No encephalomalacia to suggest chronic cortical infarction. No appreciable intra-axial mass lesion. Ventricles normal size without hydrocephalus. No other extra-axial fluid collection. 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 Midline structures intact. No other abnormal enhancement. Vascular: Major intracranial vascular flow voids are maintained. Skull and upper cervical spine:  Craniocervical junction within normal limits. Extensive and innumerable marrow replacing lesions seen throughout the calvarium, skull base, and visualized upper spine, compatible with history of multiple myeloma. Most prominent of these lesions positioned in the clivus and measures approximately 2 cm in size. No other scalp soft tissue abnormality. Sinuses/Orbits: Globes and orbital soft tissues within normal limits. Paranasal sinuses are largely clear. No significant mastoid effusion. Inner ear structures grossly normal. Other: None. IMPRESSION: 1. Acute intracranial hemorrhage involving the anterior-inferior right frontal convexity, stable from prior head CT. No associated edema or significant mass effect. 2. No other acute intracranial abnormality. 3. Innumerable marrow replacing lesions throughout the calvarium, skull base, and visualized upper spine, compatible with history of multiple myeloma. Electronically Signed   By: BJeannine BogaM.D.   On: 03/20/2020 07:35   CT CHEST ABDOMEN PELVIS W CONTRAST  Result Date: 03/20/2020 CLINICAL DATA:  Abdominal distension. EXAM: CT CHEST, ABDOMEN, AND PELVIS WITH CONTRAST TECHNIQUE: Multidetector CT imaging of the chest, abdomen and pelvis was performed following the standard protocol during bolus administration of intravenous contrast. CONTRAST:  1059mOMNIPAQUE IOHEXOL 300 MG/ML  SOLN COMPARISON:  February 14, 2020 FINDINGS: CT CHEST FINDINGS Cardiovascular: No significant vascular findings. There is mild cardiomegaly. No pericardial effusion. Mediastinum/Nodes:  No enlarged mediastinal, hilar, or axillary lymph nodes. Thyroid gland, trachea, and esophagus demonstrate no significant findings. Lungs/Pleura: Mild to moderate severity areas of atelectasis and/or infiltrate are seen within the inferior aspect of the left upper lobe posterior aspect of the superior portion of the right lower lobe and posterior aspect of the bilateral lung bases. There is a very small  left pleural effusion. A 5.2 cm x 1.5 cm pleural based soft tissue mass is seen along the anterior aspect of the left upper lobe (axial CT image 22, CT series number 3). A 1.4 cm pleural based soft tissue nodule is seen within this region on the prior study. Musculoskeletal: Chronic anterior second, third and fourth left rib fractures are noted. A chronic fracture of the right scapula is also seen. Numerous lytic lesions are seen throughout the thoracic spine with a chronic pathologic fractures noted at the levels of T6 and T9 vertebral bodies. These are seen on the prior study. CT ABDOMEN PELVIS FINDINGS Hepatobiliary: A 9 mm cystic appearing area is seen within the medial aspect of the right lobe of the liver. No gallstones, gallbladder wall thickening, or biliary dilatation. Pancreas: Unremarkable. No pancreatic ductal dilatation or surrounding inflammatory changes. Spleen: Normal in size without focal abnormality. Adrenals/Urinary Tract: Adrenal glands are unremarkable. Kidneys are normal in size, without renal or hydronephrosis. A 1.3 cm simple cyst is seen within the anteromedial aspect of the upper left kidney. Bladder is unremarkable. Stomach/Bowel: There is a small hiatal hernia. Appendix appears normal. No evidence of bowel wall thickening, distention, or inflammatory changes. A moderate amount of stool is seen within the distal sigmoid colon and rectum. Vascular/Lymphatic: No significant vascular findings are present. No enlarged abdominal or pelvic lymph nodes. Reproductive: The prostate gland is markedly enlarged. Other: A 4.0 cm x 3.2 cm fat-containing right scrotal hernia is noted. No abdominopelvic ascites. Musculoskeletal: Numerous lytic lesions are seen throughout the lumbar spine and pelvis. IMPRESSION: 1. Mild to moderate severity bilateral areas of atelectasis and/or infiltrate. 2. 5.2 cm x 1.5 cm pleural based soft tissue mass along the anterior aspect of the left upper lobe. A 1.4 cm pleural  based soft tissue nodule is seen within this region on the prior study. These findings are concerning for the presence of a primary lung malignancy. Further evaluation with a nuclear medicine PET/CT is recommended. 3. Findings consistent with multiple myeloma versus diffuse osseous metastasis, with chronic pathologic fractures at the levels of T6 and T9 vertebral bodies. 4. Markedly enlarged prostate gland. 5. 4.0 cm x 3.2 cm fat-containing right scrotal hernia. 6. Mild cardiomegaly. 7. Small hiatal hernia. 8. Aortic atherosclerosis. 9. Small simple cyst within the left kidney. Aortic Atherosclerosis (ICD10-I70.0). Electronically Signed   By: Virgina Norfolk M.D.   On: 03/20/2020 01:09   CT BIOPSY  Result Date: 03/29/2020 INDICATION: 54 year old male with history of multiple myeloma and left anterior chest wall mass. EXAM: CT BIOPSY COMPARISON:  Chest CT from 03/27/2020 MEDICATIONS: None. ANESTHESIA/SEDATION: Fentanyl 25 mcg IV; Versed 0.5 mg IV Sedation time: 18 minutes; The patient was continuously monitored during the procedure by the interventional radiology nurse under my direct supervision. CONTRAST:  None. COMPLICATIONS: None immediate. PROCEDURE: Informed consent was obtained from the patient following an explanation of the procedure, risks, benefits and alternatives. A time out was performed prior to the initiation of the procedure. The patient was positioned supine on the CT table and a limited CT was performed for procedural planning demonstrating similar appearing left anterior chest wall  soft tissue mass invading the anterior second and third ribs measuring approximately 5.0 x 3.1 cm in axial dimension. The procedure was planned. The operative site was prepped and draped in the usual sterile fashion. Appropriate trajectory was confirmed with a 22 gauge spinal needle after the adjacent tissues were anesthetized with 1% Lidocaine with epinephrine. Under intermittent CT guidance, a 17 gauge coaxial  needle was advanced into the peripheral aspect of the mass. Appropriate positioning was confirmed and a total of 5 samples were obtained with an 18 gauge core needle biopsy device. The co-axial needle was removed while injecting Gel-Foam slurry and hemostasis was achieved with manual compression. A limited postprocedural CT was negative for hemorrhage or additional complication. A dressing was placed. The patient tolerated the procedure well without immediate postprocedural complication. IMPRESSION: Technically successful CT guided core needle biopsy of left anterior chest wall mass. Electronically Signed   By: Ruthann Cancer MD   On: 03/29/2020 10:33   DG CHEST PORT 1 VIEW  Result Date: 03/30/2020 CLINICAL DATA:  Dyspnea EXAM: PORTABLE CHEST 1 VIEW COMPARISON:  03/28/2020 FINDINGS: Cardiac shadow is within normal limits. Lungs are well aerated bilaterally. Patchy airspace opacity is again identified throughout both lungs similar to that seen on the prior study. Stable lytic lesions are noted within the bony skeleton. IMPRESSION: Stable patchy airspace opacities bilaterally. Electronically Signed   By: Inez Catalina M.D.   On: 03/30/2020 21:38   DG CHEST PORT 1 VIEW  Result Date: 03/28/2020 CLINICAL DATA:  Tachypnea EXAM: PORTABLE CHEST 1 VIEW COMPARISON:  03/27/2020 CT FINDINGS: Cardiac shadow is stable. Diffuse bilateral infiltrates are again seen similar to that noted on prior chest CT. No sizable effusion is noted. No pneumothorax is noted. Lytic lesions are noted throughout the bony skeleton particularly in the midthoracic spine similar to that seen on recent CT examination. IMPRESSION: Patchy bilateral infiltrate stable from the prior exam. Stable lytic lesions within the bony structures. Electronically Signed   By: Inez Catalina M.D.   On: 03/28/2020 23:04   DG Chest Port 1 View  Result Date: 03/23/2020 CLINICAL DATA:  Difficulty breathing EXAM: PORTABLE CHEST 1 VIEW COMPARISON:  03/23/2019 FINDINGS:  Cardiac shadow is mildly prominent but stable. Patchy airspace opacity is again noted bilaterally but improved when compared with the prior exam. No sizable effusion or pneumothorax is noted. No bony abnormality is seen. IMPRESSION: Improving aeration bilaterally. Electronically Signed   By: Inez Catalina M.D.   On: 03/23/2020 07:37   DG CHEST PORT 1 VIEW  Result Date: 03/22/2020 CLINICAL DATA:  Rapid response EXAM: PORTABLE CHEST 1 VIEW COMPARISON:  03/21/2020, 03/21/2020, 02/25/2020 FINDINGS: Mildly diminished lung volumes. Mild cardiomegaly. Left greater than right airspace disease. No pneumothorax. IMPRESSION: Patchy left greater than right airspace disease concerning for pneumonia. Findings appear slightly worsened. Electronically Signed   By: Donavan Foil M.D.   On: 03/22/2020 00:47   DG CHEST PORT 1 VIEW  Result Date: 03/21/2020 CLINICAL DATA:  Shortness of breath EXAM: PORTABLE CHEST 1 VIEW COMPARISON:  March 19, 2020 chest radiograph and chest CT March 20, 2020 FINDINGS: There is persistent atelectatic change in left mid lung and left base regions. There is also atelectatic change in the right mid lung and right base regions, new. There may be superimposed pneumonia in the left mid lung. There is a small left pleural effusion. Heart is upper normal in size with pulmonary vascularity normal. No adenopathy. Old healed rib trauma on the left, better seen by CT.  IMPRESSION: Areas of atelectatic change bilaterally, overall increased bilaterally. Question superimposed pneumonia left mid lung. Small left pleural effusion. Stable cardiac silhouette. Electronically Signed   By: Lowella Grip III M.D.   On: 03/21/2020 08:09   DG Abd Portable 1V  Result Date: 03/13/2020 CLINICAL DATA:  Abdominal distention, ileus EXAM: PORTABLE ABDOMEN - 1 VIEW COMPARISON:  03/14/2020 FINDINGS: Diffuse gaseous distention of bowel, stable or worsening since prior study. No visible free air organomegaly.  IMPRESSION: Stable or slight worsening diffuse gaseous distention of bowel compatible with ileus. Electronically Signed   By: Rolm Baptise M.D.   On: 03/13/2020 05:54   DG Abd Portable 1V  Result Date: 03/14/2020 CLINICAL DATA:  Fever EXAM: X-RAY ABDOMEN 1 VIEW COMPARISON:  03/13/2020 FINDINGS: The bowel gas pattern is nonspecific with gaseous distention of loops of small bowel and colon scattered throughout the abdomen. There is an above average amount of stool throughout the ascending colon. There is no definite pneumatosis or free air. No radiopaque kidney stones. IMPRESSION: 1. Nonspecific bowel gas pattern with gaseous distention of loops of small bowel and colon scattered throughout the abdomen. 2. Above average amount of stool throughout the ascending colon. This is not significantly changed from 03/13/2020. Electronically Signed   By: Constance Holster M.D.   On: 03/24/2020 17:46   EEG adult  Result Date: 03/24/2020 Trevor Havens, MD     03/24/2020 11:15 AM Patient Name: Trevor Shelton MRN: [id number] Epilepsy Attending: Lora Shelton Referring Physician/Provider: Dr Roland Rack Date: 03/24/2020 Duration: 22.49 mins Patient history: 54yo M with seizure like episode. EEG to evaluate for seizure. Level of alertness: Awake AEDs during EEG study: LEV Technical aspects: This EEG study was done with scalp electrodes positioned according to the 10-20 International system of electrode placement. Electrical activity was acquired at a sampling rate of _0  and reviewed with a high frequency filter of _1  and a low frequency filter of _2 . EEG data were recorded continuously and digitally stored. Description: No posterior dominant rhythm was seen. EEG showed continuous generalized 3 to 6 Hz theta-delta slowing. Significant eye flutter artifact was also noted. Hyperventilation and photic stimulation were not performed.   ABNORMALITY -Continuous slow, generalized IMPRESSION: This study is suggestive  of moderate diffuse encephalopathy, nonspecific etiology. No seizures or epileptiform discharges were seen throughout the recording. Trevor Shelton   ECHOCARDIOGRAM COMPLETE  Result Date: 03/21/2020    ECHOCARDIOGRAM REPORT   Patient Name:   Trevor Shelton Date of Exam: 03/21/2020 Medical Rec #:  [id number]   Height:       63.0 in Accession #:    [id number]  Weight:       126.5 lb Date of Birth:  Jul 28, 1965    BSA:          1.592 m Patient Age:    52 years    BP:           133/99 mmHg Patient Gender: M           HR:           112 bpm. Exam Location:  Inpatient Procedure: 2D Echo, Color Doppler and Cardiac Doppler Indications:    Bacteremia  History:        Patient has no prior history of Echocardiogram examinations.                 Risk Factors:Hypertension, Diabetes and Dyslipidemia.  Sonographer:    Raquel Sarna Senior RDCS Referring Phys: [id number] Roney Jaffe  Sonographer Comments:  Poor apical window due to restricted mobility. IMPRESSIONS  1. Left ventricular ejection fraction, by estimation, is 70 to 75%. The left ventricle has hyperdynamic function. The left ventricle has no regional wall motion abnormalities. There is mild left ventricular hypertrophy. Left ventricular diastolic parameters are consistent with Grade I diastolic dysfunction (impaired relaxation).  2. Right ventricular systolic function is normal. The right ventricular size is normal.  3. Left atrial size was moderately dilated.  4. The mitral valve is abnormal. Trivial mitral valve regurgitation.  5. The aortic valve is tricuspid. Aortic valve regurgitation is not visualized.  6. The inferior vena cava is normal in size with greater than 50% respiratory variability, suggesting right atrial pressure of 3 mmHg. Conclusion(s)/Recommendation(s): No evidence of valvular vegetations on this transthoracic echocardiogram. Would recommend a transesophageal echocardiogram to exclude infective endocarditis if clinically indicated. FINDINGS  Left Ventricle: Left  ventricular ejection fraction, by estimation, is 70 to 75%. The left ventricle has hyperdynamic function. The left ventricle has no regional wall motion abnormalities. The left ventricular internal cavity size was normal in size. There is mild left ventricular hypertrophy. Left ventricular diastolic parameters are consistent with Grade I diastolic dysfunction (impaired relaxation). Indeterminate filling pressures. Right Ventricle: The right ventricular size is normal. No increase in right ventricular wall thickness. Right ventricular systolic function is normal. Left Atrium: Left atrial size was moderately dilated. Right Atrium: Right atrial size was normal in size. Pericardium: There is no evidence of pericardial effusion. Mitral Valve: The mitral valve is abnormal. There is mild thickening of the mitral valve leaflet(s). Trivial mitral valve regurgitation. Tricuspid Valve: The tricuspid valve is grossly normal. Tricuspid valve regurgitation is not demonstrated. Aortic Valve: The aortic valve is tricuspid. Aortic valve regurgitation is not visualized. Pulmonic Valve: The pulmonic valve was grossly normal. Pulmonic valve regurgitation is not visualized. Aorta: The aortic root and ascending aorta are structurally normal, with no evidence of dilitation. Venous: The inferior vena cava is normal in size with greater than 50% respiratory variability, suggesting right atrial pressure of 3 mmHg. IAS/Shunts: No atrial level shunt detected by color flow Doppler.  LEFT VENTRICLE PLAX 2D LVIDd:         4.30 cm LVIDs:         2.40 cm LV PW:         1.10 cm LV IVS:        0.90 cm LVOT diam:     2.00 cm LV SV:         78 LV SV Index:   49 LVOT Area:     3.14 cm  RIGHT VENTRICLE RV S prime:     19.80 cm/s TAPSE (M-mode): 2.4 cm LEFT ATRIUM             Index       RIGHT ATRIUM           Index LA diam:        3.70 cm 2.32 cm/m  RA Area:     19.10 cm LA Vol (A2C):   46.0 ml 28.90 ml/m RA Volume:   53.30 ml  33.48 ml/m LA Vol  (A4C):   82.5 ml 51.83 ml/m LA Biplane Vol: 63.7 ml 40.02 ml/m  AORTIC VALVE LVOT Vmax:   126.00 cm/s LVOT Vmean:  96.400 cm/s LVOT VTI:    0.248 m  AORTA Ao Root diam: 3.20 cm Ao Asc diam:  2.80 cm  SHUNTS Systemic VTI:  0.25 m Systemic Diam: 2.00 cm Lyman Bishop MD Electronically  signed by Lyman Bishop MD Signature Date/Time: 03/21/2020/12:51:13 PM    Final    VAS [place] LOWER EXTREMITY VENOUS (DVT)  Result Date: 03/03/2020  Lower Venous DVTStudy Indications: Immobility.  Performing Technologist: Griffin Basil RCT RDMS  Examination Guidelines: A complete evaluation includes B-mode imaging, spectral Doppler, color Doppler, and power Doppler as needed of all accessible portions of each vessel. Bilateral testing is considered an integral part of a complete examination. Limited examinations for reoccurring indications may be performed as noted. The reflux portion of the exam is performed with the patient in reverse Trendelenburg.  +---------+---------------+---------+-----------+----------+--------------+ RIGHT    CompressibilityPhasicitySpontaneityPropertiesThrombus Aging +---------+---------------+---------+-----------+----------+--------------+ CFV      Full           Yes      Yes                                 +---------+---------------+---------+-----------+----------+--------------+ SFJ      Full                                                        +---------+---------------+---------+-----------+----------+--------------+ FV Prox  Full                                                        +---------+---------------+---------+-----------+----------+--------------+ FV Mid   Full                                                        +---------+---------------+---------+-----------+----------+--------------+ FV DistalFull                                                        +---------+---------------+---------+-----------+----------+--------------+ PFV       Full                                                        +---------+---------------+---------+-----------+----------+--------------+ POP      Full           Yes      Yes                                 +---------+---------------+---------+-----------+----------+--------------+ PTV      Full                                                        +---------+---------------+---------+-----------+----------+--------------+ PERO     Full                                                        +---------+---------------+---------+-----------+----------+--------------+   +---------+---------------+---------+-----------+----------+--------------+  LEFT     CompressibilityPhasicitySpontaneityPropertiesThrombus Aging +---------+---------------+---------+-----------+----------+--------------+ CFV      Full           Yes      Yes                                 +---------+---------------+---------+-----------+----------+--------------+ SFJ      Full                                                        +---------+---------------+---------+-----------+----------+--------------+ FV Prox  Full                                                        +---------+---------------+---------+-----------+----------+--------------+ FV Mid   Full                                                        +---------+---------------+---------+-----------+----------+--------------+ FV DistalFull                                                        +---------+---------------+---------+-----------+----------+--------------+ PFV      Full                                                        +---------+---------------+---------+-----------+----------+--------------+ POP      Full           Yes      Yes                                 +---------+---------------+---------+-----------+----------+--------------+ PTV      Full                                                         +---------+---------------+---------+-----------+----------+--------------+ PERO     Full                                                        +---------+---------------+---------+-----------+----------+--------------+     Summary: RIGHT: - There is no evidence of deep vein thrombosis in the lower extremity.  - No cystic structure found in the popliteal fossa.  LEFT: - There is no evidence of deep vein thrombosis in the lower extremity.  - No  cystic structure found in the popliteal fossa.  *See table(s) above for measurements and observations. Electronically signed by Monica Martinez MD on 03/03/2020 at 3:26:05 PM.    Final     ASSESSMENT & PLAN Aadarsh Cozort 54 y.o. male with medical history significant for HTN and HLD who presented with back pain and was found to have cord compression and lytic lesions concerning for multiple myeloma. The patient's imaging and blood work are most concerning for multiple myeloma.  Interestingly the M spike and the serum free light chain ratio is relatively modest compared to the extent of the damage seen on CT scan.  The patient had significant hypercalcemia upon presentation. Bone marrow biopsy was performed on 02/19/2020 and was consistent with a plasma cell neoplasm.  Cytogenetics were normal.  He received radiation for impending cord compression which was completed on 03/02/2020.   Recommended CyBorD chemotherapy.  Since the patient was admitted to the inpatient rehab unit, chemotherapy was initiated on 03/15/2020.  He was started on Velcade and dexamethasone only with plan to add Cytoxan as an outpatient. This was held due to numerous complications including brain bleed, MSSA bacteremia, and lung mass.  SPEP 02/15/2020: M protein 0.9 SFLC: kappa 37.5, Lambda 9.9, ratio 3.79 UPEP: 02/17/2020: M spike not observed.  Immunofixation showed IgG monoclonal protein with kappa light chain specificity  # Lytic Lesions Througout Skeleton/Hypercalcemia #  Pathologic Fractures of Spine/Soft Tissue Masses in Spine # Multiple Myeloma --bone marrow biopsy performed 02/19/2020 to confirm diagnosis; results consistent with plasma cell neoplasm.  Normal cytogenetics. --M protein and Kappa ratio are modestly elevated compared the the extent of damage seen on CT/MRI. 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   --The patient was started on CyBorD chemotherapy.  Velcade and dexamethasone were given on 9/7.  We had plan to administer his second dose of Velcade on 9/14, but given his change in status and MSSA bacteremia, chemotherapy was held --Continue acyclovir 400 mg daily. --will reassess for consideration of restarting treatment next week, assuming his overall clinical status and counts stabilize.  --in the interim agree with continued [place] discussions with palliative care. Overall prognosis is not clear, as the biggest issue are the complications he has had preventing him from proceeding with treatment.   #MSSA bacteremia --From possible pneumonia. --currently on Ancef until 04/04/2020.   #Thrombocytopenia, stable --Likely due to underlying multiple myeloma and recent Velcade treatment although Velcade is not known to cause significant pancytopenia.  Could also be due to acute infection/bacteremia/antibiotics. --Continue supportive therapy with platelet transfusion for platelet count less than 20 or if he has severe, active clinical bleeding. --HIT panel not indicative of HIT.  Likely due to bacteremia/acute infection.  Counts remain stable.  #Anemia --Hemoglobin stable at 7.4 today --Anemia likely due to underlying malignancy and acute illness --Monitor hemoglobin and transfuse PRBCs for hemoglobin less than 7.  #Pleural-based soft tissue mass noted on recent CT --biopsy of pleural-based soft tissue mass shows  mixed inflammation, debris and hemorrhage --no clear sign of malignancy --I have reached out pulmonary regarding their thoughts on this lesion   04/01/2020 4:19 PM

## 2020-04-01 NOTE — Progress Notes (Signed)
OT Cancellation Note  Patient Details Name: Franchot Pollitt MRN: [id number] DOB: 1965/10/28   Cancelled Treatment:    Reason Eval/Treat Not Completed: Other (comment) (Patient engaged with scheduled PT session.  ) Continues efforts as appropriate.    Jermani Pund D Phyllistine Domingos 04/01/2020, 2:00 PM  04/01/2020  Denice Paradise, OTR/L  Acute Rehabilitation Services  Office:  [phone]

## 2020-04-01 NOTE — Progress Notes (Signed)
Patient ID: Trevor Shelton, male   DOB: [birth year], 54 y.o.   MRN: [id number]  PROGRESS NOTE    Trevor Shelton  FVC:[id number] DOB: [birth year] DOA: 03/30/2020 PCP: Patient, No Pcp Per   Brief Narrative:  54 year old male with very long/complex hospitalization at [facility] long hospital recently in 02/2020 with back pain and was found to have diffuse lytic lesions throughout the skeleton, epidural tumor extension with pathological fracture of T6 and T7, contrast-enhancing mass replacing most of T5 vertebral body with severe cord compression, and acute renal failure, required couple of hemodialysis sessions, kidney function improved. --Followed by medical and radiation oncology, treated with IV Decadron and has completed 10/10 doses of XRT. Work-up consistent with newly diagnosed IgG kappa MM with plans for CyBorD chemo to start following completion of XRT.  -Following transfer to rehab, he underwent chemotherapy on 03/15/2020 and next chemotherapy was scheduled for 03/22/2020.Patient had significant nosebleed on 03/17/2020. -Subsequently spiked fevers with T-max 102 -blood culture grew MSSA bacteremia, started on IV Ancef seen by ID as well for this -During this hospitalization; he was also noted to have a 5 x 2 cm left upper lobe chest wall mass on CT. -On 03/21/2020: He was found to be lethargic/less responsive.  Seen by neurology: Suspicious for seizure activity.  Started on Keppra.  EEG negative.  MRI noted to have acute intracranial hemorrhage in the right frontal convexity, followed by neurology for this.  Mental status slowly improving. -Patient has been complaining of chest pain throughout at the site of his chest wall mass.  CTA negative for PE [facility] course complicated by ileus and pleural effusion from third spacing, now improving -03/28/2020: Received platelet transfusion.  03/29/2020: CT-guided biopsy of left anterior chest wall mass  Assessment & Plan:   Acute intracranial hemorrhage -On 9/12  patient was noted to be disoriented, CT head noted right frontal lesion concerning for hemorrhage , MRI noted acute hemorrhage involving anterior-inferior right frontal convexity, this is felt to be secondary to severe thrombocytopenia from effect of multiple myeloma and chemotherapy.  -Repeat CT head decreased size of hemorrhage -Appreciate neurology input, recommended baby aspirin when platelet count is higher and cleared by oncology -transfuse plst if <20K or actively bleeding as per oncology -Mental status has improved and stabilized  Severe sepsis: Present on admission MSSA bacteremia -Currently sepsis has resolved.  Repeat blood cultures have been negative.  2D echo was unremarkable for vegetations.  TEE deferred especially in the setting of severe thrombocytopenia and complicated hospitalization -Currently on IV Ancef.  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  -Rx w/ velcade and dexamethasone on 9/7, acyclovir, diflucan. -Oncology following, further chemotherapy on hold with acute infection -Remains on Decadron every other day and prophylactic acyclovir -oncology reevaluation appreciated: Considering restarting treatment next week if his overall clinical status and counts stabilize. -Palliative care evaluation appreciated.  Pleural-based soft tissue mass noted on recent CT -Pathology of biopsy showed mixed inflammation, debris and hemorrhage with no clear signs of malignancy.  Pancytopenia/anemia/severe thrombocytopenia -Status post multiple units of packed red cells transfusion and platelet transfusion during this  hospitalization -Monitor.  Transfuse PRBC if hemoglobin less than 7 and platelets less  than 10K or active bleeding -Hemoglobin 7.4 and platelets 114 on 03/31/2020.  Labs pending for today.  Hyponatremia -Questionable cause.  Sodium 126 on 03/31/2020.  Sodium 125 today.  Continue IV Lasix.  Acute encephalopathy multifactorial -Became lethargic and more confused on 03/20/2020.  There was a question of seizure.  He was transferred to neuro ICU on 03/22/2020 and started on Keppra.  EEG showed moderate encephalopathy with no epileptiform discharges. -Currently mental status stable.  Neurology has signed off.  Continue Keppra.  Epistaxis -Resolved.  Continue Afrin as needed  Chest pain/diffuse body pain -Secondary to diffuse bony involvement and chest wall mass.  EKG unremarkable.  Troponins negative.  CTA negative for PE.  Continue narcotics as needed  Pleural effusions Third spacing: -Continue diuresis with Lasix  AKI/Short term HD in august 2021:  -Renal function improved and stable  T2DM -newly diagnosed in august 2021. a1c 6.5.  -CBGs fairly stable now continue Lantus and sliding scale insulin  Chronic constipation Ileus -Continue laxatives  HTN/HLD -bp meds amlodipine on hold, on coreg low dose and is on Midodrine -BP more stable now -Continue weaning off midodrine as tolerated  Severe hypoalbuminemia -Could be secondary to underlying malignancy and complicated hospitalization -Follow nutrition recommendations  Weakness/debility/physical deconditioning:  Lower extremity weakness from spinal cord involvement -Will eventually need SNF placement.  Social work following -Palliative care following.  DVT prophylaxis: SCDs Code Status: DNR Family Communication: Wife at bedside on 03/31/2020 by using video interpreter for communication. Disposition Plan: Status is: Inpatient  Remains inpatient appropriate because:Inpatient level of care appropriate due to severity of  illness   Dispo: The patient is from: Home              Anticipated d/c is to: SNF              Anticipated d/c date is: > 3 days              Patient currently is not medically stable to d/c.  Consultants: Neurology/oncology/IR  Procedures: Chest wall mass biopsy on 03/29/2020  Antimicrobials:  Anti-infectives (From admission, onward)   Start     Dose/Rate Route Frequency Ordered Stop   03/20/20 1600  ceFAZolin (ANCEF) IVPB 2g/100 mL premix        2 g 200 mL/hr over 30 Minutes Intravenous Every 8 hours 03/20/20 1505     03/20/20 1000  acyclovir (ZOVIRAX) 200 MG capsule 400 mg        400 mg Oral Daily 03/18/2020 2217     03/20/20 1000  fluconazole (DIFLUCAN) tablet 100 mg        100 mg Oral Daily 03/24/2020 2217 03/23/20 0959   03/20/20 0800  vancomycin (VANCOREADY) IVPB 750 mg/150 mL  Status:  Discontinued        750 mg 150 mL/hr over 60 Minutes Intravenous Every 12 hours 03/31/2020 2217 03/20/20 1452   03/20/20 0800  ceFEPIme (MAXIPIME) 2 g in sodium chloride 0.9 % 100 mL IVPB  Status:  Discontinued        2 g 200 mL/hr over 30 Minutes Intravenous Every 8 hours 03/20/20 0001 03/20/20 1452   03/20/20 0015  ceFEPIme (MAXIPIME) 2 g in sodium chloride 0.9 % 100 mL IVPB        2 g 200 mL/hr over 30 Minutes Intravenous  Once 03/20/20 0001 03/20/20 0159   03/09/2020 2230  vancomycin (VANCOREADY) IVPB 1250 mg/250 mL  Status:  Discontinued        1,250 mg 166.7  mL/hr over 90 Minutes Intravenous  Once 03/13/2020 2217 03/28/2020 2225   03/24/2020 2224  piperacillin-tazobactam (ZOSYN) IVPB 3.375 g  Status:  Discontinued        3.375 g 12.5 mL/hr over 240 Minutes Intravenous Every 8 hours 04/01/2020 2217 03/20/20 0000       Subjective: Patient seen and examined at bedside.  Sleepy, wakes up slightly, does not answer much questions.  Still intermittently short of breath with generalized pains.  Feels very weak and tired.  No overnight fever or vomiting. Objective: Vitals:   03/31/20 2055 03/31/20  2056 04/01/20 0022 04/01/20 0413  BP: 126/74   110/64  Pulse: 99 99 (P) 96 (!) 101  Resp: (!) 23   19  Temp: 97.9 F (36.6 C) 97.9 F (36.6 C) (!) (P) 97.5 F (36.4 C) 97.9 F (36.6 C)  TempSrc: Oral  (P) Oral Oral  SpO2: 95%   97%  Weight:      Height:    _0  (1.6 m)    Intake/Output Summary (Last 24 hours) at 04/01/2020 0725 Last data filed at 04/01/2020 0530 Gross per 24 hour  Intake 2218.83 ml  Output 2150 ml  Net 68.83 ml   Filed Weights   03/28/20 2218 03/30/20 0401 03/31/20 0407  Weight: 69.1 kg 63.5 kg 59.6 kg    Examination:  General exam: No distress.  Poor historian.  Pale looking.  Chronically ill looking. Respiratory system: Bilateral decreased breath sounds at bases with scattered crackles, no wheezing  cardiovascular system: Intermittent tachycardia; S1-S2 heard Gastrointestinal system: Abdomen is nondistended, soft and nontender.  Normal bowel sounds  extremities: No clubbing.  Trace lower extremity edema present Central nervous system:Sleepy, wakes up slightly, does not answer much questions.  Poor historian.  No focal neurological deficits.  Moving extremities Skin: No obvious lesions/petechiae Psychiatry: Flat affect on waking up.   Data Reviewed: I have personally reviewed following labs and imaging studies  CBC: Recent Labs  Lab 03/26/20 0254 03/26/20 0254 03/27/20 1537 03/28/20 0355 03/28/20 1538 03/29/20 0040 03/29/20 0816 03/30/20 1400 03/31/20 0724  WBC 4.0   < > 4.3  --  5.5 4.9 4.1 4.3 3.9*  NEUTROABS 3.3  --  4.0  --   --   --   --   --  3.0  HGB 7.1*   < > 6.2*   < > 8.1* 7.1* 7.1* 7.9* 7.4*  HCT 21.4*   < > 18.1*   < > 23.4* 20.8* 20.5* 23.2* 22.4*  MCV 86.3   < > 87.9  --  85.7 86.0 87.2 88.9 88.9  PLT 35*   < > 25*  --  38* 45* 123* 141* 114*   < > = values in this interval not displayed.   Basic Metabolic Panel: Recent Labs  Lab 03/27/20 0139 03/28/20 0852 03/29/20 0040 03/30/20 1400 03/31/20 0724  NA 124* 126* 129*  129* 126*  K 3.6 3.1* 3.7 4.2 3.7  CL 96* 93* 95* 92* 90*  CO2 21* _1 GLUCOSE 138* 146* 173* 215* 134*  BUN 21* 16 19 23* 19  CREATININE 0.63 0.62 0.79 0.77 0.63  CALCIUM 8.8* 8.7* 9.0 9.1 9.1  MG  --   --   --   --  1.3*   GFR: Estimated Creatinine Clearance: 83 mL/min (by C-G formula based on SCr of 0.63 mg/dL). Liver Function Tests: Recent Labs  Lab 03/26/20 0254 03/27/20 0139 03/29/20 0040  AST 29 30 38  ALT  _0 ALKPHOS 70 73 78  BILITOT 0.4 0.6 0.2*  PROT 5.2* 5.2* 5.1*  ALBUMIN 1.5* 1.5* 1.6*   No results for input(s): LIPASE, AMYLASE in the last 168 hours. No results for input(s): AMMONIA in the last 168 hours. Coagulation Profile: Recent Labs  Lab 03/29/20 0040  INR 1.2   Cardiac Enzymes: No results for input(s): CKTOTAL, CKMB, CKMBINDEX, TROPONINI in the last 168 hours. BNP (last 3 results) No results for input(s): PROBNP in the last 8760 hours. HbA1C: No results for input(s): HGBA1C in the last 72 hours. CBG: Recent Labs  Lab 03/30/20 2112 03/31/20 0751 03/31/20 1212 03/31/20 1628 03/31/20 2101  GLUCAP 205* 142* 234* 226* 176*   Lipid Profile: No results for input(s): CHOL, HDL, LDLCALC, TRIG, CHOLHDL, LDLDIRECT in the last 72 hours. Thyroid Function Tests: No results for input(s): TSH, T4TOTAL, FREET4, T3FREE, THYROIDAB in the last 72 hours. Anemia Panel: No results for input(s): VITAMINB12, FOLATE, FERRITIN, TIBC, IRON, RETICCTPCT in the last 72 hours. Sepsis Labs: No results for input(s): PROCALCITON, LATICACIDVEN in the last 168 hours.  No results found for this or any previous visit (from the past 240 hour(s)).       Radiology Studies: DG CHEST PORT 1 VIEW  Result Date: 03/30/2020 CLINICAL DATA:  Dyspnea EXAM: PORTABLE CHEST 1 VIEW COMPARISON:  03/28/2020 FINDINGS: Cardiac shadow is within normal limits. Lungs are well aerated bilaterally. Patchy airspace opacity is again identified throughout both lungs similar to that  seen on the prior study. Stable lytic lesions are noted within the bony skeleton. IMPRESSION: Stable patchy airspace opacities bilaterally. Electronically Signed   By: Inez Catalina M.D.   On: 03/30/2020 21:38        Scheduled Meds: . (feeding supplement) PROSource Plus  30 mL Oral BID BM  . acetaminophen  650 mg Oral Q6H  . acyclovir  400 mg Oral Daily  . carvedilol  3.125 mg Oral BID WC  . dexamethasone (DECADRON) injection  2 mg Intravenous QODAY  . furosemide  40 mg Intravenous Q12H  . insulin aspart  0-9 Units Subcutaneous TID WC  . insulin aspart  3 Units Subcutaneous TID WC  . insulin glargine  15 Units Subcutaneous QHS  . lactulose  20 g Oral TID  . levETIRAcetam  500 mg Oral BID  . lidocaine  2 patch Transdermal Q24H  . melatonin  3 mg Oral QHS  . midodrine  2.5 mg Oral BID WC  . pantoprazole  40 mg Oral BID AC  . Ensure Max Protein  11 oz Oral BID BM  . QUEtiapine  12.5 mg Oral QHS  . senna-docusate  2 tablet Oral BID  . sodium chloride  2 spray Each Nare Q1H while awake  . sucralfate  1 g Oral TID WC & HS   Continuous Infusions: . sodium chloride 10 mL/hr at 03/31/20 2107  .  ceFAZolin (ANCEF) IV Stopped (04/01/20 0530)          Aline August, MD [facility] 04/01/2020, 7:25 AM

## 2020-04-01 NOTE — Progress Notes (Signed)
Palliative:  HPI: 54 y.o. male   admitted on 03/21/2020 with PMH significant for multiple myeloma initially diagnosed after a fall.  A CT-guided bone marrow biopsy on 8/13/21showedpositive for neoplasm kappa light chain consistent with multiple myeloma. Imaging showed multiple spinal lytic lesions,left rib fracture on ninth and 10th, cord compression.He finished 10 sessions of radiation therapy.He was discharged to [facility] 8/26 where he received his first dose of chemotherapy.On 9/11 he was re-admitted to [facility] with fevers and encephalopathy. CT showed small ICH. Blood cultures grew MSSA. He had been improving and ICH had been resolving on imaging. In the late PM hours of 9/14 he became lethargic and unresponsive. CT head showed improvement of bleed.   I met today at Mr. Osuna bedside along with his wife and Spanish interpreter. Mr. Hopkin has just completed session with therapy. His therapy and ability to participate was greatly altered by severe pain through his ribs, shoulders, and back. We further discussed his pain and he does report some relief with IV morphine but wife says this does make him sleepy at times. We discussed consideration of trial of long acting morphine to see if this provide better overall relief perhaps without the abrupt onset of sedation he gets from IV morphine. If this makes him too sleepy we can always stop long acting and come up with another plan. He does ask about addiction with morphine. We discussed that this is an addictive substance but that we will monitor him closely and that he does have a medical condition that is going to require medication with risk of addiction for pain relief. He also reports that he has been told that this pain is expected to worsen. I acknowledge that this is very likely and why he is going to require pain medication for relief and this outweighs the risks of addiction.   Mr. Phegley is anxious to continue working with therapy to try and  get stronger. Since he is greatly limited due to pain we decide it is best to continue to better manage his pain with long acting medication to give him the best opportunity to participate and benefit from therapy. Will begin MS Contin tonight and I will reassess tomorrow.   All questions/concerns addressed. Emotional support provided. Discussed with Dr. Starla Link.   Exam: Alert, oriented. Slightly short of breath after working with therapy. Thin, frail.   Plan: - Cancer associated pain: MS Contin 15 mg every 12 hours to begin tonight. Continue morphine IV 2 mg every 4 hours as needed for now. I will follow up and make further adjustments as needed tomorrow.  - Continue working with therapy with hopes of gaining some strength back.  - Palliative meeting scheduled with Wadie Lessen NP 04/04/20 1500 to further address goals of care.   25 min  Vinie Sill, NP Palliative Medicine Team Pager [phone] (Please see [email or web address] for schedule) Team Phone [phone]    Greater than 50%  of this time was spent counseling and coordinating care related to the above assessment and plan

## 2020-04-02 LAB — BASIC METABOLIC PANEL
Anion gap: 13 (ref 5–15)
BUN: 30 mg/dL — ABNORMAL HIGH (ref 6–20)
CO2: 27 mmol/L (ref 22–32)
Calcium: 9.5 mg/dL (ref 8.9–10.3)
Chloride: 84 mmol/L — ABNORMAL LOW (ref 98–111)
Creatinine, Ser: 0.64 mg/dL (ref 0.61–1.24)
GFR calc Af Amer: >60 mL/min (ref >60)
GFR calc non Af Amer: >60 mL/min (ref >60)
Glucose, Bld: 121 mg/dL — ABNORMAL HIGH (ref 70–99)
Potassium: 2.8 mmol/L — ABNORMAL LOW (ref 3.5–5.1)
Sodium: 124 mmol/L — ABNORMAL LOW (ref 135–145)

## 2020-04-02 LAB — CBC WITH DIFFERENTIAL/PLATELET
Abs Immature Granulocytes: 0.25 10*3/uL — ABNORMAL HIGH (ref 0.00–0.07)
Basophils Absolute: 0 10*3/uL (ref 0.0–0.1)
Basophils Relative: 1 %
Eosinophils Absolute: 0 10*3/uL (ref 0.0–0.5)
Eosinophils Relative: 0 %
HCT: 21.7 % — ABNORMAL LOW (ref 39.0–52.0)
Hemoglobin: 7.6 g/dL — ABNORMAL LOW (ref 13.0–17.0)
Immature Granulocytes: 6 %
Lymphocytes Relative: 11 %
Lymphs Abs: 0.5 10*3/uL — ABNORMAL LOW (ref 0.7–4.0)
MCH: 30.3 pg (ref 26.0–34.0)
MCHC: 35 g/dL (ref 30.0–36.0)
MCV: 86.5 fL (ref 80.0–100.0)
Monocytes Absolute: 0.3 10*3/uL (ref 0.1–1.0)
Monocytes Relative: 6 %
Neutro Abs: 3.5 10*3/uL (ref 1.7–7.7)
Neutrophils Relative %: 76 %
Platelets: 95 10*3/uL — ABNORMAL LOW (ref 150–400)
RBC: 2.51 MIL/uL — ABNORMAL LOW (ref 4.22–5.81)
RDW: 13.5 % (ref 11.5–15.5)
WBC: 4.5 10*3/uL (ref 4.0–10.5)
nRBC: 0.7 % — ABNORMAL HIGH (ref 0.0–0.2)

## 2020-04-02 LAB — GLUCOSE, CAPILLARY
Glucose-Capillary: 123 mg/dL — ABNORMAL HIGH (ref 70–99)
Glucose-Capillary: 191 mg/dL — ABNORMAL HIGH (ref 70–99)
Glucose-Capillary: 199 mg/dL — ABNORMAL HIGH (ref 70–99)
Glucose-Capillary: 200 mg/dL — ABNORMAL HIGH (ref 70–99)

## 2020-04-02 LAB — MAGNESIUM: Magnesium: 1.6 mg/dL — ABNORMAL LOW (ref 1.7–2.4)

## 2020-04-02 MED ORDER — MORPHINE SULFATE (PF) 2 MG/ML IV SOLN
2.0000 mg | INTRAVENOUS | Status: DC | PRN
  Administered 2020-04-02 – 2020-04-03 (×4): 2 mg via INTRAVENOUS
  Filled 2020-04-02 (×4): qty 1

## 2020-04-02 MED ORDER — ACETAMINOPHEN 500 MG PO TABS
1000.0000 mg | ORAL_TABLET | Freq: Three times a day (TID) | ORAL | Status: DC
  Administered 2020-04-02 – 2020-04-06 (×12): 1000 mg via ORAL
  Filled 2020-04-02 (×12): qty 2

## 2020-04-02 MED ORDER — POTASSIUM CHLORIDE CRYS ER 20 MEQ PO TBCR
40.0000 meq | EXTENDED_RELEASE_TABLET | ORAL | Status: AC
  Administered 2020-04-02 (×2): 40 meq via ORAL
  Filled 2020-04-02 (×2): qty 2

## 2020-04-02 MED ORDER — CALCIUM CARBONATE ANTACID 500 MG PO CHEW
400.0000 mg | CHEWABLE_TABLET | Freq: Two times a day (BID) | ORAL | Status: DC
  Administered 2020-04-02 – 2020-04-05 (×8): 400 mg via ORAL
  Filled 2020-04-02 (×9): qty 2

## 2020-04-02 MED ORDER — SODIUM CHLORIDE 1 G PO TABS
1.0000 g | ORAL_TABLET | Freq: Three times a day (TID) | ORAL | Status: DC
  Administered 2020-04-02 – 2020-04-05 (×11): 1 g via ORAL
  Filled 2020-04-02 (×14): qty 1

## 2020-04-02 NOTE — Progress Notes (Signed)
Palliative:  HPI: 54 y.o.maleadmitted on 9/11/2021with PMH significant for multiple myeloma initially diagnosed after a fall.  ACT-guided bone marrow biopsy on 8/13/21showedpositive for neoplasm kappa light chain consistent with multiple myeloma. Imaging showed multiple spinal lytic lesions,left rib fracture on ninth and 10th, cord compression.He finished 10 sessions of radiation therapy.He was discharged to [facility] 8/26 where he received his first dose of chemotherapy.On 9/11 he was re-admitted to [facility] with fevers and encephalopathy. CT showed small ICH. Blood cultures grew MSSA. He had been improving and ICH had been resolving on imaging. In the late PM hours of 9/14 he became lethargic and unresponsive. CT head showed improvement of bleed.  I met today at Mr. Rasmusson bedside along with wife and communicated via tele interpreter at bedside. He is in distress this morning with severe pain and shortness of breath RR 29. They tell me that he was doing well until he ate breakfast this morning and then had severe pain. This may could be a possible aspiration event or could be reflux/heartburn as well. Difficult to say for sure. I placed him on 2L oxygen and called for IV morphine dose. Wife voiced concern for having more pain medication and I did explain that unfortunately we do not have much choice as he is having a pain crisis and without relief he will continue to worsen and this can lead to more physical distress and exhaustion as well. If he sleeps after this dose of pain medication I feel that this will be okay to give his body some time to recover from this incident.   Their other concern is that condom catheter keeps coming off and he is wet and needs to be cleaned up. Wife reports that she is present in the day to help with urinal but not able to do this during the night. We did discuss that we can consider use of internal catheter if needed for his comfort but this can be uncomfortable  to place and does carry a risk of infection as well. They would like to continue with urinal/condom catheter for now and we will consider internal catheter in the future as needed.   I had discussion with bedside RN and explained situation and need for IV morphine. I did explain that MS Contin will NOT provide any immediate relief as this is a slow release and it is okay to give quick acting and long acting opioids at the same time.   Exam: Pallor. Fatigue. Distress from pain and short of breath with tachypnea. Abd soft. Generalized weakness.   Plan: - Pain: Continue MS Contin 15 mg every 12 hours. IV morphine 2 mg increased to every 2 hours as needed. Changed tylenol to 1000 mg TID. Could consider increasing decadron as well.  - Reflux/heartburn??: Continue Protonix and sucralfate. Adding TUMS twice daily with meals.  - Bowel Regimen: Continue senokot-s 2 tabs twice daily. Daily BMs so far. Sorbitol daily as needed.   [place], NP Palliative Medicine Team Pager [phone] (Please see [email or web address] for schedule) Team Phone [phone]    Greater than 50%  of this time was spent counseling and coordinating care related to the above assessment and plan

## 2020-04-02 NOTE — Progress Notes (Signed)
Patient ID: Trevor Shelton, male   DOB: [birth year], 54 y.o.   MRN: [id number]  PROGRESS NOTE    Trevor Shelton  FVC:[id number] DOB: [birth year] DOA: 03/30/2020 PCP: Patient, No Pcp Per   Brief Narrative:  54 year old male with very long/complex hospitalization at [facility] long hospital recently in 02/2020 with back pain and was found to have diffuse lytic lesions throughout the skeleton, epidural tumor extension with pathological fracture of T6 and T7, contrast-enhancing mass replacing most of T5 vertebral body with severe cord compression, and acute renal failure, required couple of hemodialysis sessions, kidney function improved. --Followed by medical and radiation oncology, treated with IV Decadron and has completed 10/10 doses of XRT. Work-up consistent with newly diagnosed IgG kappa MM with plans for CyBorD chemo to start following completion of XRT.  -Following transfer to rehab, he underwent chemotherapy on 03/15/2020 and next chemotherapy was scheduled for 03/22/2020.Patient had significant nosebleed on 03/17/2020. -Subsequently spiked fevers with T-max 102 -blood culture grew MSSA bacteremia, started on IV Ancef seen by ID as well for this -During this hospitalization; he was also noted to have a 5 x 2 cm left upper lobe chest wall mass on CT. -On 03/21/2020: He was found to be lethargic/less responsive.  Seen by neurology: Suspicious for seizure activity.  Started on Keppra.  EEG negative.  MRI noted to have acute intracranial hemorrhage in the right frontal convexity, followed by neurology for this.  Mental status slowly improving. -Patient has been complaining of chest pain throughout at the site of his chest wall mass.  CTA negative for PE [facility] course complicated by ileus and pleural effusion from third spacing, now improving -03/28/2020: Received platelet transfusion.  03/29/2020: CT-guided biopsy of left anterior chest wall mass  Assessment & Plan:   Acute intracranial hemorrhage -On 9/12  patient was noted to be disoriented, CT head noted right frontal lesion concerning for hemorrhage , MRI noted acute hemorrhage involving anterior-inferior right frontal convexity, this is felt to be secondary to severe thrombocytopenia from effect of multiple myeloma and chemotherapy.  -Repeat CT head decreased size of hemorrhage -Appreciate neurology input, recommended baby aspirin when platelet count is higher and cleared by oncology -transfuse plst if <20K or actively bleeding as per oncology -Mental status has improved and stabilized  Severe sepsis: Present on admission MSSA bacteremia -Currently sepsis has resolved.  Repeat blood cultures have been negative.  2D echo was unremarkable for vegetations.  TEE deferred especially in the setting of severe thrombocytopenia and complicated hospitalization -Currently on IV Ancef.  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  -Rx w/ velcade and dexamethasone on 9/7, acyclovir, diflucan. -Oncology following, further chemotherapy on hold with acute infection -Remains on Decadron every other day and prophylactic acyclovir -oncology reevaluation appreciated: Considering restarting treatment next week if his overall clinical status and counts stabilize. -Palliative care evaluation appreciated.  Pleural-based soft tissue mass noted on recent CT -Pathology of biopsy showed mixed inflammation, debris and hemorrhage with no clear signs of malignancy.  Pancytopenia/anemia/severe thrombocytopenia -Status post multiple units of packed red cells transfusion and platelet transfusion during this  hospitalization -Monitor.  Transfuse PRBC if hemoglobin less than 7 and platelets less  than 10K or active bleeding -Hemoglobin 7.6 and platelets 95 today.  Hyponatremia -Questionable cause.  Sodium 125 on 04/01/2020.  Sodium 124 today.  DC IV Lasix.  Start salt tablets.  Hypokalemia -Replace.  Repeat a.m. labs  Hypomagnesemia -Replace.  Repeat a.m. labs  Acute encephalopathy multifactorial -Became lethargic and more confused on 03/20/2020.  There was a question of seizure.  He was transferred to neuro ICU on 03/22/2020 and started on Keppra.  EEG showed moderate encephalopathy with no epileptiform discharges. -Currently mental status stable.  Neurology has signed off.  Continue Keppra.  Epistaxis -Resolved.  Continue Afrin as needed  Chest pain/diffuse body pain -Secondary to diffuse bony involvement and chest wall mass.  EKG unremarkable.  Troponins negative.  CTA negative for PE.  Continue narcotics as needed.  Palliative care following and helping with pain management as well.  Pleural effusions Third spacing: -Continue diuresis with Lasix  AKI/Short term HD in august 2021:  -Renal function improved and stable  T2DM -newly diagnosed in august 2021. a1c 6.5.  -CBGs fairly stable now continue Lantus and sliding scale insulin  Chronic constipation Ileus -Continue laxatives  HTN/HLD -bp meds amlodipine on hold, on coreg low dose and is on Midodrine -BP more stable now -Continue weaning off midodrine as tolerated  Severe hypoalbuminemia -Could be secondary to underlying malignancy and complicated hospitalization -Follow nutrition recommendations  Weakness/debility/physical deconditioning:  Lower extremity weakness from spinal cord involvement -Will eventually need SNF placement.  Social work following -Palliative care following.  DVT prophylaxis: SCDs Code Status: DNR Family Communication: Sister present at bedside on 04/02/2020  disposition Plan: Status is:  Inpatient  Remains inpatient appropriate because:Inpatient level of care appropriate due to severity of illness   Dispo: The patient is from: Home              Anticipated d/c is to: SNF              Anticipated d/c date is: > 3 days              Patient currently is not medically stable to d/c.  Consultants: Neurology/oncology/IR  Procedures: Chest wall mass biopsy on 03/29/2020  Antimicrobials:  Anti-infectives (From admission, onward)   Start     Dose/Rate Route Frequency Ordered Stop   03/20/20 1600  ceFAZolin (ANCEF) IVPB 2g/100 mL premix        2 g 200 mL/hr over 30 Minutes Intravenous Every 8 hours 03/20/20 1505     03/20/20 1000  acyclovir (ZOVIRAX) 200 MG capsule 400 mg        400 mg Oral Daily 03/14/2020 2217     03/20/20 1000  fluconazole (DIFLUCAN) tablet 100 mg        100 mg Oral Daily 03/31/2020 2217 03/23/20 0959   03/20/20 0800  vancomycin (VANCOREADY) IVPB 750 mg/150 mL  Status:  Discontinued        750 mg 150 mL/hr over 60 Minutes Intravenous Every 12 hours 03/11/2020 2217 03/20/20 1452   03/20/20 0800  ceFEPIme (MAXIPIME) 2 g in sodium chloride 0.9 % 100 mL IVPB  Status:  Discontinued        2 g 200 mL/hr over 30 Minutes Intravenous Every 8 hours 03/20/20 0001 03/20/20 1452   03/20/20 0015  ceFEPIme (MAXIPIME) 2 g in sodium chloride 0.9 % 100 mL IVPB        2 g 200 mL/hr over 30 Minutes Intravenous  Once 03/20/20 0001 03/20/20 0159   03/22/2020 2230  vancomycin (  VANCOREADY) IVPB 1250 mg/250 mL  Status:  Discontinued        1,250 mg 166.7 mL/hr over 90 Minutes Intravenous  Once 03/14/2020 2217 03/28/2020 2225   04/03/2020 2224  piperacillin-tazobactam (ZOSYN) IVPB 3.375 g  Status:  Discontinued        3.375 g 12.5 mL/hr over 240 Minutes Intravenous Every 8 hours 03/25/2020 2217 03/20/20 0000       Subjective: Patient seen and examined at bedside.  Poor historian.  Complains of pain all over but states the pain medications are working.  No overnight fever, vomiting  reported.  Feels very weak. Objective: Vitals:   04/01/20 1631 04/01/20 2129 04/02/20 0101 04/02/20 0502  BP: 124/84 113/67 119/70 115/71  Pulse: (!) 113 99 95   Resp: (!) 27 (!) 25 (!) 24 19  Temp: 98.4 F (36.9 C) 97.8 F (36.6 C) 97.8 F (36.6 C) 98.8 F (37.1 C)  TempSrc: Oral Oral Oral Oral  SpO2: 95% 95% 93% 94%  Weight:    61.1 kg  Height:        Intake/Output Summary (Last 24 hours) at 04/02/2020 0731 Last data filed at 04/02/2020 8938 Gross per 24 hour  Intake 950 ml  Output 3050 ml  Net -2100 ml   Filed Weights   03/30/20 0401 03/31/20 0407 04/02/20 0502  Weight: 63.5 kg 59.6 kg 61.1 kg    Examination:  General exam: No acute distress.  Looks very chronically ill.  Poor historian.  Pale looking.   Respiratory system: Bilateral decreased breath sounds at bases with no wheezing.  Scattered crackles heard.  Intermittently tachypneic.   Cardiovascular system: S1-S2 heard, rate controlled  gastrointestinal system: Abdomen is nondistended, soft and nontender.  Bowel sounds are heard  extremities: Mild lower extremity edema present.  No clubbing or cyanosis Central nervous system: Extremely poor historian.  No focal neurological deficits.  Moves extremities Skin: No obvious ecchymosis/rashes Psychiatry: Affect is extremely flat   Data Reviewed: I have personally reviewed following labs and imaging studies  CBC: Recent Labs  Lab 03/27/20 1537 03/28/20 0355 03/29/20 0040 03/29/20 0816 03/30/20 1400 03/31/20 0724 04/01/20 0717  WBC 4.3   < > 4.9 4.1 4.3 3.9* 3.7*  NEUTROABS 4.0  --   --   --   --  3.0 3.0  HGB 6.2*   < > 7.1* 7.1* 7.9* 7.4* 7.4*  HCT 18.1*   < > 20.8* 20.5* 23.2* 22.4* 21.8*  MCV 87.9   < > 86.0 87.2 88.9 88.9 86.9  PLT 25*   < > 45* 123* 141* 114* 101*   < > = values in this interval not displayed.   Basic Metabolic Panel: Recent Labs  Lab 03/28/20 0852 03/29/20 0040 03/30/20 1400 03/31/20 0724 04/01/20 0717  NA 126* 129* 129* 126*  125*  K 3.1* 3.7 4.2 3.7 3.3*  CL 93* 95* 92* 90* 89*  CO2 $Re'25 26 25 25 26  'btK$ GLUCOSE 146* 173* 215* 134* 181*  BUN 16 19 23* 19 28*  CREATININE 0.62 0.79 0.77 0.63 0.73  CALCIUM 8.7* 9.0 9.1 9.1 9.3  MG  --   --   --  1.3* 1.6*   GFR: Estimated Creatinine Clearance: 83 mL/min (by C-G formula based on SCr of 0.73 mg/dL). Liver Function Tests: Recent Labs  Lab 03/27/20 0139 03/29/20 0040  AST 30 38  ALT 12 17  ALKPHOS 73 78  BILITOT 0.6 0.2*  PROT 5.2* 5.1*  ALBUMIN 1.5* 1.6*   No results  for input(s): LIPASE, AMYLASE in the last 168 hours. No results for input(s): AMMONIA in the last 168 hours. Coagulation Profile: Recent Labs  Lab 03/29/20 0040  INR 1.2   Cardiac Enzymes: No results for input(s): CKTOTAL, CKMB, CKMBINDEX, TROPONINI in the last 168 hours. BNP (last 3 results) No results for input(s): PROBNP in the last 8760 hours. HbA1C: No results for input(s): HGBA1C in the last 72 hours. CBG: Recent Labs  Lab 03/31/20 2101 04/01/20 0803 04/01/20 1147 04/01/20 1629 04/01/20 2058  GLUCAP 176* 158* 229* 220* 172*   Lipid Profile: No results for input(s): CHOL, HDL, LDLCALC, TRIG, CHOLHDL, LDLDIRECT in the last 72 hours. Thyroid Function Tests: No results for input(s): TSH, T4TOTAL, FREET4, T3FREE, THYROIDAB in the last 72 hours. Anemia Panel: No results for input(s): VITAMINB12, FOLATE, FERRITIN, TIBC, IRON, RETICCTPCT in the last 72 hours. Sepsis Labs: No results for input(s): PROCALCITON, LATICACIDVEN in the last 168 hours.  No results found for this or any previous visit (from the past 240 hour(s)).       Radiology Studies: No results found.      Scheduled Meds: . (feeding supplement) PROSource Plus  30 mL Oral BID BM  . acetaminophen  650 mg Oral Q6H  . acyclovir  400 mg Oral Daily  . carvedilol  3.125 mg Oral BID WC  . dexamethasone (DECADRON) injection  2 mg Intravenous QODAY  . furosemide  40 mg Intravenous Q12H  . insulin aspart  0-9  Units Subcutaneous TID WC  . insulin aspart  3 Units Subcutaneous TID WC  . insulin glargine  15 Units Subcutaneous QHS  . lactulose  20 g Oral TID  . levETIRAcetam  500 mg Oral BID  . lidocaine  2 patch Transdermal Q24H  . melatonin  3 mg Oral QHS  . midodrine  2.5 mg Oral Daily  . morphine  15 mg Oral Q12H  . pantoprazole  40 mg Oral BID AC  . Ensure Max Protein  11 oz Oral BID BM  . QUEtiapine  12.5 mg Oral QHS  . senna-docusate  2 tablet Oral BID  . sodium chloride  2 spray Each Nare Q1H while awake  . sucralfate  1 g Oral TID WC & HS   Continuous Infusions: . sodium chloride 10 mL/hr at 04/02/20 0430  .  ceFAZolin (ANCEF) IV 2 g (04/02/20 0431)          Aline August, MD [facility] 04/02/2020, 7:31 AM

## 2020-04-03 LAB — BASIC METABOLIC PANEL
Anion gap: 9 (ref 5–15)
BUN: 28 mg/dL — ABNORMAL HIGH (ref 6–20)
CO2: 25 mmol/L (ref 22–32)
Calcium: 9.7 mg/dL (ref 8.9–10.3)
Chloride: 92 mmol/L — ABNORMAL LOW (ref 98–111)
Creatinine, Ser: 0.68 mg/dL (ref 0.61–1.24)
GFR calc Af Amer: >60 mL/min (ref >60)
GFR calc non Af Amer: >60 mL/min (ref >60)
Glucose, Bld: 146 mg/dL — ABNORMAL HIGH (ref 70–99)
Potassium: 3.6 mmol/L (ref 3.5–5.1)
Sodium: 126 mmol/L — ABNORMAL LOW (ref 135–145)

## 2020-04-03 LAB — CBC WITH DIFFERENTIAL/PLATELET
Abs Immature Granulocytes: 0.3 10*3/uL — ABNORMAL HIGH (ref 0.00–0.07)
Basophils Absolute: 0 10*3/uL (ref 0.0–0.1)
Basophils Relative: 0 %
Eosinophils Absolute: 0 10*3/uL (ref 0.0–0.5)
Eosinophils Relative: 0 %
HCT: 20.5 % — ABNORMAL LOW (ref 39.0–52.0)
Hemoglobin: 7 g/dL — ABNORMAL LOW (ref 13.0–17.0)
Immature Granulocytes: 7 %
Lymphocytes Relative: 10 %
Lymphs Abs: 0.4 10*3/uL — ABNORMAL LOW (ref 0.7–4.0)
MCH: 29.5 pg (ref 26.0–34.0)
MCHC: 34.1 g/dL (ref 30.0–36.0)
MCV: 86.5 fL (ref 80.0–100.0)
Monocytes Absolute: 0.3 10*3/uL (ref 0.1–1.0)
Monocytes Relative: 7 %
Neutro Abs: 3.1 10*3/uL (ref 1.7–7.7)
Neutrophils Relative %: 76 %
Platelets: 74 10*3/uL — ABNORMAL LOW (ref 150–400)
RBC: 2.37 MIL/uL — ABNORMAL LOW (ref 4.22–5.81)
RDW: 13.5 % (ref 11.5–15.5)
WBC: 4.1 10*3/uL (ref 4.0–10.5)
nRBC: 1.2 % — ABNORMAL HIGH (ref 0.0–0.2)

## 2020-04-03 LAB — MAGNESIUM: Magnesium: 1.5 mg/dL — ABNORMAL LOW (ref 1.7–2.4)

## 2020-04-03 LAB — GLUCOSE, CAPILLARY
Glucose-Capillary: 140 mg/dL — ABNORMAL HIGH (ref 70–99)
Glucose-Capillary: 159 mg/dL — ABNORMAL HIGH (ref 70–99)
Glucose-Capillary: 311 mg/dL — ABNORMAL HIGH (ref 70–99)
Glucose-Capillary: 245 mg/dL — ABNORMAL HIGH (ref 70–99)

## 2020-04-03 MED ORDER — GABAPENTIN 300 MG PO CAPS
300.0000 mg | ORAL_CAPSULE | Freq: Every day | ORAL | Status: DC
  Administered 2020-04-03 – 2020-04-05 (×3): 300 mg via ORAL
  Filled 2020-04-03 (×4): qty 1

## 2020-04-03 MED ORDER — MORPHINE SULFATE (PF) 4 MG/ML IV SOLN
4.0000 mg | INTRAVENOUS | Status: DC | PRN
  Administered 2020-04-03 – 2020-04-06 (×14): 4 mg via INTRAVENOUS
  Filled 2020-04-03 (×15): qty 1

## 2020-04-03 MED ORDER — MAGNESIUM SULFATE 2 GM/50ML IV SOLN
2.0000 g | Freq: Once | INTRAVENOUS | Status: AC
  Administered 2020-04-03: 2 g via INTRAVENOUS
  Filled 2020-04-03: qty 50

## 2020-04-03 MED ORDER — MORPHINE SULFATE ER 15 MG PO TBCR
15.0000 mg | EXTENDED_RELEASE_TABLET | Freq: Once | ORAL | Status: AC
  Administered 2020-04-03: 15 mg via ORAL
  Filled 2020-04-03: qty 1

## 2020-04-03 MED ORDER — LORAZEPAM 0.5 MG PO TABS: 0.5000 mg | ORAL_TABLET | Freq: Every day | ORAL | Status: DC

## 2020-04-03 MED ORDER — DEXAMETHASONE SODIUM PHOSPHATE 10 MG/ML IJ SOLN
8.0000 mg | Freq: Once | INTRAMUSCULAR | Status: AC
  Administered 2020-04-03: 8 mg via INTRAVENOUS
  Filled 2020-04-03: qty 0.8

## 2020-04-03 MED ORDER — LORAZEPAM 0.5 MG PO TABS
0.5000 mg | ORAL_TABLET | Freq: Two times a day (BID) | ORAL | Status: DC | PRN
  Administered 2020-04-03 – 2020-04-06 (×3): 0.5 mg via ORAL
  Filled 2020-04-03 (×4): qty 1

## 2020-04-03 MED ORDER — MORPHINE SULFATE ER 15 MG PO TBCR
30.0000 mg | EXTENDED_RELEASE_TABLET | Freq: Two times a day (BID) | ORAL | Status: DC
  Administered 2020-04-03 – 2020-04-07 (×7): 30 mg via ORAL
  Filled 2020-04-03 (×8): qty 2

## 2020-04-03 MED ORDER — DEXAMETHASONE 4 MG PO TABS
4.0000 mg | ORAL_TABLET | Freq: Two times a day (BID) | ORAL | Status: DC
  Administered 2020-04-03 – 2020-04-06 (×6): 4 mg via ORAL
  Filled 2020-04-03 (×7): qty 1

## 2020-04-03 NOTE — Progress Notes (Signed)
Daily Progress Note   Patient Name: Trevor Shelton       Date: 04/03/2020 DOB: 1966/02/01  Age: 54 y.o. MRN#: [id number] Attending Physician: Aline August, MD Primary Care Physician: Patient, No Pcp Per Admit Date: 03/22/2020  Reason for Consultation/Follow-up:  Symptom management.  Asked by my colleague to see for pain.  PMT will return 9/27 to continue to address goals of care. Discussed with RN.  Chart reviewed.  Utilized [employer] on speaker phone.  Wife present.  Subjective: Patient describes severe burning pain in his center and left chest, as well as his upper back and right shoulder.  This pain is worse with deep breathing and worse with movement.  The IV morphine seem to help but wears off too quickly.  Patient has been on Decadron for cord compression this is being tapered.  Patient reports he has to have some relief for the pain.  He is currently on oxycontin 15 mg, low dose IV morphine, and also IV decadron QOD as well as a lidocaine patch.   Assessment: Severe burning pain worse with movement.  Complains of anxiety and insomnia at night.   Patient Profile/HPI:  HPI: 54 y.o.maleadmitted on 9/11/2021with PMH significant for multiple myeloma initially diagnosed after a fall.  ACT-guided bone marrow biopsy on 8/13/21showedpositive for neoplasm kappa light chain consistent with multiple myeloma. Imaging showed multiple spinal lytic lesions,left rib fracture on ninth and 10th, cord compression.He finished 10 sessions of radiation therapy.He was discharged to [facility] 8/26 where he received his first dose of chemotherapy.On 9/11 he was re-admitted to [facility] with fevers and encephalopathy. CT showed small ICH. Blood cultures grew MSSA. He had been improving and ICH had  been resolving on imaging. In the late PM hours of 9/14 he became lethargic and unresponsive. CT head showed improvement of bleed.   Length of Stay: 15   Vital Signs: BP 121/73 (BP Location: Right Arm)   Pulse (!) 106   Temp 98.1 F (36.7 C) (Oral)   Resp (!) 23   Ht _0  (1.6 m)   Wt 61.1 kg   SpO2 96%   BMI 23.86 kg/m  SpO2: SpO2: 96 % O2 Device: O2 Device: Room Air O2 Flow Rate: O2 Flow Rate (L/min): 2 L/min       Palliative Assessment/Data:  30%  Palliative Care Plan    Recommendations/Plan: Trial K-Pad - heating pain for pain of chest, back and shoulders. Will increase decadron with a 1 time dose today and then 4 mg bid Will increase oxycontin to 30 mg q 12 Will increase PRN dose of morphine to 4 mg  Will add gabapentin QHS for burning pain and insomnia.  Can taper up if it helps.  Code Status:  DNR  Prognosis:   < 6 months.  Patient at high risk for acute decline, rehospitalization, or death from widely metastatic cancer.   Discharge Planning:  To Be Determined  Care plan was discussed with RN, patient, and wife.  Thank you for allowing the Palliative Medicine Team to assist in the care of this patient.  Total time spent:  35 min.     Greater than 50%  of this time was spent counseling and coordinating care related to the above assessment and plan.  Florentina Jenny, PA-C Palliative Medicine  Please contact Palliative MedicineTeam phone at [phone] for questions and concerns between 7 am - 7 pm.   Please see AMION for individual provider pager numbers.

## 2020-04-03 NOTE — Progress Notes (Signed)
Patient ID: Trevor Shelton, male   DOB: [birth year], 54 y.o.   MRN: [id number]  PROGRESS NOTE    Trevor Shelton  FVC:[id number] DOB: [birth year] DOA: 03/30/2020 PCP: Patient, No Pcp Per   Brief Narrative:  54 year old male with very long/complex hospitalization at [facility] long hospital recently in 02/2020 with back pain and was found to have diffuse lytic lesions throughout the skeleton, epidural tumor extension with pathological fracture of T6 and T7, contrast-enhancing mass replacing most of T5 vertebral body with severe cord compression, and acute renal failure, required couple of hemodialysis sessions, kidney function improved. --Followed by medical and radiation oncology, treated with IV Decadron and has completed 10/10 doses of XRT. Work-up consistent with newly diagnosed IgG kappa MM with plans for CyBorD chemo to start following completion of XRT.  -Following transfer to rehab, he underwent chemotherapy on 03/15/2020 and next chemotherapy was scheduled for 03/22/2020.Patient had significant nosebleed on 03/17/2020. -Subsequently spiked fevers with T-max 102 -blood culture grew MSSA bacteremia, started on IV Ancef seen by ID as well for this -During this hospitalization; he was also noted to have a 5 x 2 cm left upper lobe chest wall mass on CT. -On 03/21/2020: He was found to be lethargic/less responsive.  Seen by neurology: Suspicious for seizure activity.  Started on Keppra.  EEG negative.  MRI noted to have acute intracranial hemorrhage in the right frontal convexity, followed by neurology for this.  Mental status slowly improving. -Patient has been complaining of chest pain throughout at the site of his chest wall mass.  CTA negative for PE [facility] course complicated by ileus and pleural effusion from third spacing, now improving -03/28/2020: Received platelet transfusion.  03/29/2020: CT-guided biopsy of left anterior chest wall mass  Assessment & Plan:   Acute intracranial hemorrhage -On 9/12  patient was noted to be disoriented, CT head noted right frontal lesion concerning for hemorrhage , MRI noted acute hemorrhage involving anterior-inferior right frontal convexity, this is felt to be secondary to severe thrombocytopenia from effect of multiple myeloma and chemotherapy.  -Repeat CT head decreased size of hemorrhage -Appreciate neurology input, recommended baby aspirin when platelet count is higher and cleared by oncology -transfuse plst if <20K or actively bleeding as per oncology -Mental status has improved and stabilized  Severe sepsis: Present on admission MSSA bacteremia -Currently sepsis has resolved.  Repeat blood cultures have been negative.  2D echo was unremarkable for vegetations.  TEE deferred especially in the setting of severe thrombocytopenia and complicated hospitalization -Currently on IV Ancef.  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  -Rx w/ velcade and dexamethasone on 9/7, acyclovir, diflucan. -Oncology following, further chemotherapy on hold with acute infection -Remains on Decadron every other day and prophylactic acyclovir -oncology reevaluation appreciated: Considering restarting treatment next week if his overall clinical status and counts stabilize. -Palliative care evaluation appreciated.  Pleural-based soft tissue mass noted on recent CT -Pathology of biopsy showed mixed inflammation, debris and hemorrhage with no clear signs of malignancy.  Pancytopenia/anemia/severe thrombocytopenia -Status post multiple units of packed red cells transfusion and platelet transfusion during this  hospitalization -Monitor.  Transfuse PRBC if hemoglobin less than 7 and platelets less  than 10K or active bleeding -Hemoglobin 7 and platelets 74 today.  Hyponatremia -Questionable cause.  Sodium 126 today.  Lasix discontinued on 04/02/2020.  Salt tablets started on 04/02/2020.  Continue  Hypokalemia -Improving.  Hypomagnesemia -Replace.  Repeat a.m. labs.  Acute encephalopathy multifactorial -Became lethargic and more confused on 03/20/2020.  There was a question of seizure.  He was transferred to neuro ICU on 03/22/2020 and started on Keppra.  EEG showed moderate encephalopathy with no epileptiform discharges. -Currently mental status stable.  Neurology has signed off.  Continue Keppra.  Epistaxis -Resolved.  Continue Afrin as needed  Chest pain/diffuse body pain -Secondary to diffuse bony involvement and chest wall mass.  EKG unremarkable.  Troponins negative.  CTA negative for PE.  Continue narcotics as needed.  Palliative care following and helping with pain management as well.  Pleural effusions Third spacing: -Lasix discontinued on 04/02/2020.  AKI/Short term HD in august 2021:  -Renal function improved and stable  T2DM -newly diagnosed in august 2021. a1c 6.5.  -CBGs fairly stable now continue Lantus and sliding scale insulin  Chronic constipation Ileus -Continue laxatives  HTN/HLD -bp meds amlodipine on hold, on coreg low dose and is on Midodrine -BP more stable now -DC midodrine.  Severe hypoalbuminemia -Could be secondary to underlying malignancy and complicated hospitalization -Follow nutrition recommendations  Weakness/debility/physical deconditioning:  Lower extremity weakness from spinal cord involvement -Will eventually need SNF placement.  Social work following -Palliative care following.  DVT prophylaxis: SCDs Code Status: DNR Family Communication: Sister present at bedside on 04/02/2020  disposition Plan: Status is: Inpatient  Remains  inpatient appropriate because:Inpatient level of care appropriate due to severity of illness   Dispo: The patient is from: Home              Anticipated d/c is to: SNF              Anticipated d/c date is: > 3 days              Patient currently is not medically stable to d/c.  Consultants: Neurology/oncology/IR  Procedures: Chest wall mass biopsy on 03/29/2020  Antimicrobials:  Anti-infectives (From admission, onward)   Start     Dose/Rate Route Frequency Ordered Stop   03/20/20 1600  ceFAZolin (ANCEF) IVPB 2g/100 mL premix        2 g 200 mL/hr over 30 Minutes Intravenous Every 8 hours 03/20/20 1505     03/20/20 1000  acyclovir (ZOVIRAX) 200 MG capsule 400 mg        400 mg Oral Daily 03/18/2020 2217     03/20/20 1000  fluconazole (DIFLUCAN) tablet 100 mg        100 mg Oral Daily 03/12/2020 2217 03/23/20 0959   03/20/20 0800  vancomycin (VANCOREADY) IVPB 750 mg/150 mL  Status:  Discontinued        750 mg 150 mL/hr over 60 Minutes Intravenous Every 12 hours 03/10/2020 2217 03/20/20 1452   03/20/20 0800  ceFEPIme (MAXIPIME) 2 g in sodium chloride 0.9 % 100 mL IVPB  Status:  Discontinued        2 g 200 mL/hr over 30 Minutes Intravenous Every 8 hours 03/20/20 0001 03/20/20 1452   03/20/20 0015  ceFEPIme (MAXIPIME) 2 g in sodium chloride 0.9 % 100 mL IVPB        2 g 200 mL/hr over 30 Minutes Intravenous  Once 03/20/20 0001 03/20/20 0159   03/31/2020 2230  vancomycin (VANCOREADY) IVPB 1250 mg/250 mL  Status:  Discontinued        1,250 mg 166.7 mL/hr over 90 Minutes Intravenous  Once 03/18/2020 2217 04/06/2020 2225   03/18/2020 2224  piperacillin-tazobactam (ZOSYN) IVPB 3.375 g  Status:  Discontinued        3.375 g 12.5 mL/hr over 240 Minutes Intravenous Every 8 hours 03/24/2020 2217 03/20/20 0000       Subjective: Patient seen and examined at bedside.  Poor historian.  Feels weak and tired.  Complains of intermittent pain all over.  No overnight fever or vomiting or worsening shortness of breath  reported  objective: Vitals:   04/02/20 1618 04/02/20 2113 04/03/20 0021 04/03/20 0440  BP: 119/71 115/65 119/64 111/71  Pulse: (!) 111 99 100 97  Resp: (!) 22 (!) $Remo'22 19 20  'UorUx$ Temp: 97.7 F (36.5 C) 98.7 F (37.1 C) 98.7 F (37.1 C) 98.2 F (36.8 C)  TempSrc: Oral Oral Oral Oral  SpO2: 98% 98% 95% 100%  Weight:      Height:        Intake/Output Summary (Last 24 hours) at 04/03/2020 0742 Last data filed at 04/03/2020 0442 Gross per 24 hour  Intake 1307.15 ml  Output 1600 ml  Net -292.85 ml   Filed Weights   03/30/20 0401 03/31/20 0407 04/02/20 0502  Weight: 63.5 kg 59.6 kg 61.1 kg    Examination:  General exam: No distress.  Looks very chronically ill.  Poor historian.  Pale looking.   Respiratory system: Bilateral decreased breath sounds at bases with scattered crackles.  Tachypneic intermittently  cardiovascular system: Rate controlled, S1-S2 heard gastrointestinal system: Abdomen is nondistended, soft and nontender.  Normal bowel sounds are heard  extremities: No cyanosis.  Mild bilateral lower extremity edema present Central nervous system: Awake, very poor historian.  No focal neurological deficits.  Moving extremities Skin: No obvious lesions/petechiae Psychiatry: Flat affect   Data Reviewed: I have personally reviewed following labs and imaging studies  CBC: Recent Labs  Lab 03/27/20 1537 03/28/20 0355 03/30/20 1400 03/31/20 0724 04/01/20 0717 04/02/20 0723 04/03/20 0341  WBC 4.3   < > 4.3 3.9* 3.7* 4.5 4.1  NEUTROABS 4.0  --   --  3.0 3.0 3.5 3.1  HGB 6.2*   < > 7.9* 7.4* 7.4* 7.6* 7.0*  HCT 18.1*   < > 23.2* 22.4* 21.8* 21.7* 20.5*  MCV 87.9   < > 88.9 88.9 86.9 86.5 86.5  PLT 25*   < > 141* 114* 101* 95* 74*   < > = values in this interval not displayed.   Basic Metabolic Panel: Recent Labs  Lab 03/30/20 1400 03/31/20 0724 04/01/20 0717 04/02/20 0723 04/03/20 0341  NA 129* 126* 125* 124* 126*  K 4.2 3.7 3.3* 2.8* 3.6  CL 92* 90* 89* 84* 92*   CO2 $Re'25 25 26 27 25  'XHD$ GLUCOSE 215* 134* 181* 121* 146*  BUN 23* 19 28* 30* 28*  CREATININE 0.77 0.63 0.73 0.64 0.68  CALCIUM 9.1 9.1 9.3 9.5 9.7  MG  --  1.3* 1.6* 1.6* 1.5*   GFR: Estimated Creatinine Clearance: 83 mL/min (by C-G formula based on SCr of 0.68 mg/dL). Liver Function Tests: Recent Labs  Lab 03/29/20 0040  AST 38  ALT 17  ALKPHOS 78  BILITOT 0.2*  PROT 5.1*  ALBUMIN 1.6*   No results for input(s): LIPASE, AMYLASE in the last 168 hours. No results for input(s): AMMONIA in the last 168 hours. Coagulation Profile: Recent Labs  Lab 03/29/20 0040  INR 1.2  Cardiac Enzymes: No results for input(s): CKTOTAL, CKMB, CKMBINDEX, TROPONINI in the last 168 hours. BNP (last 3 results) No results for input(s): PROBNP in the last 8760 hours. HbA1C: No results for input(s): HGBA1C in the last 72 hours. CBG: Recent Labs  Lab 04/01/20 2058 04/02/20 0804 04/02/20 1115 04/02/20 1620 04/02/20 2104  GLUCAP 172* 123* 191* 199* 200*   Lipid Profile: No results for input(s): CHOL, HDL, LDLCALC, TRIG, CHOLHDL, LDLDIRECT in the last 72 hours. Thyroid Function Tests: No results for input(s): TSH, T4TOTAL, FREET4, T3FREE, THYROIDAB in the last 72 hours. Anemia Panel: No results for input(s): VITAMINB12, FOLATE, FERRITIN, TIBC, IRON, RETICCTPCT in the last 72 hours. Sepsis Labs: No results for input(s): PROCALCITON, LATICACIDVEN in the last 168 hours.  No results found for this or any previous visit (from the past 240 hour(s)).       Radiology Studies: No results found.      Scheduled Meds: . (feeding supplement) PROSource Plus  30 mL Oral BID BM  . acetaminophen  1,000 mg Oral TID  . acyclovir  400 mg Oral Daily  . calcium carbonate  400 mg of elemental calcium Oral BID WC  . carvedilol  3.125 mg Oral BID WC  . dexamethasone (DECADRON) injection  2 mg Intravenous QODAY  . insulin aspart  0-9 Units Subcutaneous TID WC  . insulin aspart  3 Units Subcutaneous  TID WC  . insulin glargine  15 Units Subcutaneous QHS  . lactulose  20 g Oral TID  . levETIRAcetam  500 mg Oral BID  . lidocaine  2 patch Transdermal Q24H  . melatonin  3 mg Oral QHS  . midodrine  2.5 mg Oral Daily  . morphine  15 mg Oral Q12H  . pantoprazole  40 mg Oral BID AC  . Ensure Max Protein  11 oz Oral BID BM  . QUEtiapine  12.5 mg Oral QHS  . senna-docusate  2 tablet Oral BID  . sodium chloride  2 spray Each Nare Q1H while awake  . sodium chloride  1 g Oral TID WC  . sucralfate  1 g Oral TID WC & HS   Continuous Infusions: . sodium chloride 10 mL/hr at 04/03/20 0524  .  ceFAZolin (ANCEF) IV 2 g (04/03/20 0526)          Aline August, MD [facility] 04/03/2020, 7:42 AM

## 2020-04-04 ENCOUNTER — Telehealth: Payer: Self-pay | Admitting: Radiation Oncology

## 2020-04-04 DIAGNOSIS — G893 Neoplasm related pain (acute) (chronic): Secondary | ICD-10-CM

## 2020-04-04 LAB — CBC WITH DIFFERENTIAL/PLATELET
Abs Immature Granulocytes: 0.32 10*3/uL — ABNORMAL HIGH (ref 0.00–0.07)
Basophils Absolute: 0 10*3/uL (ref 0.0–0.1)
Basophils Relative: 0 %
Eosinophils Absolute: 0 10*3/uL (ref 0.0–0.5)
Eosinophils Relative: 0 %
HCT: 19.9 % — ABNORMAL LOW (ref 39.0–52.0)
Hemoglobin: 6.9 g/dL — CL (ref 13.0–17.0)
Immature Granulocytes: 7 %
Lymphocytes Relative: 10 %
Lymphs Abs: 0.5 10*3/uL — ABNORMAL LOW (ref 0.7–4.0)
MCH: 30.3 pg (ref 26.0–34.0)
MCHC: 34.7 g/dL (ref 30.0–36.0)
MCV: 87.3 fL (ref 80.0–100.0)
Monocytes Absolute: 0.3 10*3/uL (ref 0.1–1.0)
Monocytes Relative: 7 %
Neutro Abs: 3.4 10*3/uL (ref 1.7–7.7)
Neutrophils Relative %: 76 %
Platelets: 59 10*3/uL — ABNORMAL LOW (ref 150–400)
RBC: 2.28 MIL/uL — ABNORMAL LOW (ref 4.22–5.81)
RDW: 13.4 % (ref 11.5–15.5)
WBC: 4.5 10*3/uL (ref 4.0–10.5)
nRBC: 0.7 % — ABNORMAL HIGH (ref 0.0–0.2)

## 2020-04-04 LAB — CBC
HCT: 24.5 % — ABNORMAL LOW (ref 39.0–52.0)
Hemoglobin: 8.2 g/dL — ABNORMAL LOW (ref 13.0–17.0)
MCH: 29.4 pg (ref 26.0–34.0)
MCHC: 33.5 g/dL (ref 30.0–36.0)
MCV: 87.8 fL (ref 80.0–100.0)
Platelets: 52 10*3/uL — ABNORMAL LOW (ref 150–400)
RBC: 2.79 MIL/uL — ABNORMAL LOW (ref 4.22–5.81)
RDW: 13.4 % (ref 11.5–15.5)
WBC: 6.3 10*3/uL (ref 4.0–10.5)
nRBC: 1.3 % — ABNORMAL HIGH (ref 0.0–0.2)

## 2020-04-04 LAB — GLUCOSE, CAPILLARY
Glucose-Capillary: 198 mg/dL — ABNORMAL HIGH (ref 70–99)
Glucose-Capillary: 253 mg/dL — ABNORMAL HIGH (ref 70–99)
Glucose-Capillary: 247 mg/dL — ABNORMAL HIGH (ref 70–99)
Glucose-Capillary: 244 mg/dL — ABNORMAL HIGH (ref 70–99)
Glucose-Capillary: 213 mg/dL — ABNORMAL HIGH (ref 70–99)

## 2020-04-04 LAB — BASIC METABOLIC PANEL
Anion gap: 8 (ref 5–15)
BUN: 32 mg/dL — ABNORMAL HIGH (ref 6–20)
CO2: 26 mmol/L (ref 22–32)
Calcium: 9.7 mg/dL (ref 8.9–10.3)
Chloride: 93 mmol/L — ABNORMAL LOW (ref 98–111)
Creatinine, Ser: 0.6 mg/dL — ABNORMAL LOW (ref 0.61–1.24)
GFR calc Af Amer: >60 mL/min (ref >60)
GFR calc non Af Amer: >60 mL/min (ref >60)
Glucose, Bld: 224 mg/dL — ABNORMAL HIGH (ref 70–99)
Potassium: 4.2 mmol/L (ref 3.5–5.1)
Sodium: 127 mmol/L — ABNORMAL LOW (ref 135–145)

## 2020-04-04 LAB — PREPARE RBC (CROSSMATCH)

## 2020-04-04 LAB — MAGNESIUM: Magnesium: 1.9 mg/dL (ref 1.7–2.4)

## 2020-04-04 MED ORDER — SODIUM CHLORIDE 0.9% IV SOLUTION: Freq: Once | INTRAVENOUS | Status: DC

## 2020-04-04 MED ORDER — INSULIN ASPART 100 UNIT/ML ~~LOC~~ SOLN
5.0000 [IU] | Freq: Three times a day (TID) | SUBCUTANEOUS | Status: DC
  Administered 2020-04-04 – 2020-04-05 (×4): 5 [IU] via SUBCUTANEOUS

## 2020-04-04 MED ORDER — CEFAZOLIN SODIUM-DEXTROSE 2-4 GM/100ML-% IV SOLN
2.0000 g | Freq: Three times a day (TID) | INTRAVENOUS | Status: DC
  Administered 2020-04-05: 2 g via INTRAVENOUS

## 2020-04-04 MED ORDER — INSULIN GLARGINE 100 UNIT/ML ~~LOC~~ SOLN
20.0000 [IU] | Freq: Every day | SUBCUTANEOUS | Status: DC
  Administered 2020-04-04 – 2020-04-05 (×2): 20 [IU] via SUBCUTANEOUS
  Filled 2020-04-04 (×3): qty 0.2

## 2020-04-04 NOTE — Telephone Encounter (Signed)
  Radiation Oncology         [phone] ________________________________  Name: Trevor Shelton MRN: [id number]  Date of Service: 04/04/2020  DOB: [birth year]  Post Treatment Telephone Note  Diagnosis:   Multiple Myeloma not having achieved remission.  Interval Since Last Radiation:  5 weeks    02/18/20 - 03/02/20: The T spine T4-10 was treated to a dose of 30 Gy in 10 fractions using a 2-field isodose technique.    Narrative:  The patient was contacted today for routine follow-up. During treatment he did very well with radiotherapy and did not have significant desquamation. He did have some mild strength that had improved in his lower extremities toward the end of radiation but did have to proceed with rehabilition where he is still residing. He unfortunately had an intracranial hemorrhage which has been managed by medicine and per the notes he is regaining his cognition.   Impression/Plan: 1. Multiple Myeloma not having achieved remission. I was not able to reach the patient or his wife but we left a voicemail with the interpretor line. I  discussed that we would be happy to continue to follow him as needed, but he will also continue to follow up with Dr. Lorenso Courier in medical oncology.     Carola Rhine, PAC

## 2020-04-04 NOTE — Progress Notes (Signed)
PT Cancellation Note  Patient Details Name: Trevor Shelton MRN: [id number] DOB: 07-07-1966   Cancelled Treatment:    Reason Eval/Treat Not Completed: (P) Medical issues which prohibited therapy Pt is receiving blood transfusion secondary to 6.9 Hgb. PT will follow back tomorrow.   Rudie Rikard B. Migdalia Dk PT, DPT Acute Rehabilitation Services Pager [phone] Office [phone]    [place] 04/04/2020, 1:10 PM

## 2020-04-04 NOTE — Progress Notes (Addendum)
CRITICAL VALUE ALERT  Critical Value:  Hgb 6.9  Date & Time Notied:  09/27 03:24  Provider Notified:  Jeronimo Greaves, MD  Orders Received/Actions taken:  1 unit RBC ordered; pending type & screen

## 2020-04-04 NOTE — Progress Notes (Signed)
Inpatient Diabetes Program Recommendations  AACE/ADA: New Consensus Statement on Inpatient Glycemic Control (2015)  Target Ranges:  Prepandial:   less than 140 mg/dL      Peak postprandial:   less than 180 mg/dL (1-2 hours)      Critically ill patients:  140 - 180 mg/dL   Lab Results  Component Value Date   GLUCAP 253 (H) 04/04/2020   HGBA1C 6.5 (H) 02/18/2020    Review of Glycemic Control Results for ZAEL, Trevor Shelton (MRN [id number]) as of 04/04/2020 13:11  Ref. Range 04/03/2020 07:54 04/03/2020 11:48 04/03/2020 17:00 04/03/2020 21:00 04/04/2020 07:41 04/04/2020 11:03  Glucose-Capillary Latest Ref Range: 70 - 99 mg/dL 140 (H) 159 (H) 311 (H) 245 (H) 198 (H) 253 (H)    Inpatient Diabetes Program Recommendations:     Postprandials elevated.  Please consider,  Novolog 3 units tid with meals if eats at least 50% of meal.  Will continue to follow while inpatient.  Thank you, Reche Dixon, RN, BSN Diabetes Coordinator Inpatient Diabetes Program [phone] (team pager from 8a-5p)

## 2020-04-04 NOTE — Progress Notes (Signed)
Patient ID: Trevor Shelton, male   DOB: [birth year], 54 y.o.   MRN: [id number]  This NP visited patient at the bedside as a follow up for palliaitve medcine needs and emotional support and to meet as schduled with wife, Spanish interpretor included.    HPI:54 y.o.maleadmitted on 9/11/2021with PMH significant formultiple myelomainitially diagnosed after a fall. ACT-guided bone marrow biopsy on 8/13/21showedpositive for neoplasm kappa light chain consistent with multiple myeloma. Imaging showed multiple spinal lytic lesions,left rib fracture on ninth and 10th, cord compression.He finished 10 sessions of radiation therapy.He was discharged to [facility] 8/26 where he received his first dose of chemotherapy.On 9/11 he was re-admitted to [facility] with fevers and encephalopathy. CT showed small ICH. Blood cultures grew MSSA. He had been improving and ICH had been resolving on imaging. In the late PM hours of 9/14 he became lethargic and unresponsive. CT head showed improvement of bleed.Patient has has continued physical and functional decline.  Difficult pain control  Patient and his family face treatment option secessions, advanced directive decisions and  anticipatory care needs.   Today is day 15 of this hospital stay.  Continued conversation regarding diagnosis, prognosis, GOCs, EOL wishes disposition and options.  Understandably this is a very hard for this patient and his family.  He speak to being a vibrant, hard working man only a few months ago.   It is hard to accept and understand.  He is open to all offered and  available medical interventions to prolong life.  Patient  looks to the physicians to help guide his decisions.  Trevor Shelton does verbalize his concern that "I am too weak to take the chemotherapy?"    Patient needs/asking for  realistic information/eduation regarding viable  treatments and prognosis to help him with his decisions.   Education offered regarding the difference  between and an aggressive medical intervention path and a palliative comfort path.   Hospice benefit detailed   Discussed with patient the importance of continued conversation with his family and the  medical providers regarding overall plan of care and treatment options,  ensuring decisions are within the context of the patients values and GOCs.  Questions and concerns addressed   Discussed with Dr Starla Link, Dr Lorenso Courier and Altamese Dilling NP via secure chat   A f/u meeting is scheduled  for Wednesday at 3:00.  Interpretor secured  Total time spent on the unit was 45 minutes    PMT will continue to support holistically   Greater than 50% of the time was spent in counseling and coordination of care  Wadie Lessen NP  Palliative Medicine Team Team Phone # [phone] Pager [phone]

## 2020-04-04 NOTE — Progress Notes (Signed)
Patient ID: Trevor Shelton, male   DOB: [birth year], 54 y.o.   MRN: [id number]  PROGRESS NOTE    Aidenn Skellenger  OEV:[id number] DOB: 1966/02/14 DOA: 03/17/2020 PCP: Patient, No Pcp Per   Brief Narrative:  54 year old male with very long/complex hospitalization at [facility] long hospital recently in 02/2020 with back pain and was found to have diffuse lytic lesions throughout the skeleton, epidural tumor extension with pathological fracture of T6 and T7, contrast-enhancing mass replacing most of T5 vertebral body with severe cord compression, and acute renal failure, required couple of hemodialysis sessions, kidney function improved. --Followed by medical and radiation oncology, treated with IV Decadron and has completed 10/10 doses of XRT. Work-up consistent with newly diagnosed IgG kappa MM with plans for CyBorD chemo to start following completion of XRT.  -Following transfer to rehab, he underwent chemotherapy on 03/15/2020 and next chemotherapy was scheduled for 03/22/2020.Patient had significant nosebleed on 03/17/2020. -Subsequently spiked fevers with T-max 102 -blood culture grew MSSA bacteremia, started on IV Ancef seen by ID as well for this -During this hospitalization; he was also noted to have a 5 x 2 cm left upper lobe chest wall mass on CT. -On 03/21/2020: He was found to be lethargic/less responsive.  Seen by neurology: Suspicious for seizure activity.  Started on Keppra.  EEG negative.  MRI noted to have acute intracranial hemorrhage in the right frontal convexity, followed by neurology for this.  Mental status slowly improving. -Patient has been complaining of chest pain throughout at the site of his chest wall mass.  CTA negative for PE [facility] course complicated by ileus and pleural effusion from third spacing, now improving -03/28/2020: Received platelet transfusion.  03/29/2020: CT-guided biopsy of left anterior chest wall mass  Assessment & Plan:   Acute intracranial hemorrhage -On 9/12  patient was noted to be disoriented, CT head noted right frontal lesion concerning for hemorrhage , MRI noted acute hemorrhage involving anterior-inferior right frontal convexity, this is felt to be secondary to severe thrombocytopenia from effect of multiple myeloma and chemotherapy.  -Repeat CT head decreased size of hemorrhage -Appreciate neurology input, recommended baby aspirin when platelet count is higher and cleared by oncology -transfuse plst if <20K or actively bleeding as per oncology -Mental status has improved and stabilized  Severe sepsis: Present on admission MSSA bacteremia -Currently sepsis has resolved.  Repeat blood cultures have been negative.  2D echo was unremarkable for vegetations.  TEE deferred especially in the setting of severe thrombocytopenia and complicated hospitalization -Currently on IV Ancef.  ID recommended to continue Ancef through 04/04/2020 -Currently afebrile.  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  -Rx w/ velcade and dexamethasone on 9/7, acyclovir, diflucan. -Oncology following, further chemotherapy on hold with acute infection -Remains on Decadron every other day and prophylactic acyclovir -oncology reevaluation appreciated: Considering restarting treatment this week if his overall clinical status and counts stabilize.  We will follow further recommendations. -Palliative care following.  Pleural-based soft tissue mass noted on recent CT -Pathology of biopsy showed mixed inflammation, debris and hemorrhage with no clear signs of malignancy.  Pancytopenia/anemia/severe thrombocytopenia -Status post multiple units of packed red cells  transfusion and platelet transfusion during this hospitalization -Monitor.  Transfuse PRBC if  hemoglobin less than 7 and platelets less than 10K or active bleeding -Hemoglobin 6.9 and platelets 59 today.  Transfuse 1 unit of packed red cells today.  Hyponatremia -Questionable cause.  Sodium 127 today.  Lasix discontinued on 04/02/2020.  Salt tablets started on 04/02/2020.  Continue  Hypokalemia -Improved.  Hypomagnesemia -Improved.  Acute encephalopathy multifactorial -Became lethargic and more confused on 03/20/2020.  There was a question of seizure.  He was transferred to neuro ICU on 03/22/2020 and started on Keppra.  EEG showed moderate encephalopathy with no epileptiform discharges. -Currently mental status stable.  Neurology has signed off.  Continue Keppra.  Epistaxis -Resolved.  Continue Afrin as needed  Chest pain/diffuse body pain -Secondary to diffuse bony involvement and chest wall mass.  EKG unremarkable.  Troponins negative.  CTA negative for PE.  Continue narcotics as needed.  Palliative care following and helping with pain management as well.  Pleural effusions Third spacing: -Lasix discontinued on 04/02/2020.  AKI/Short term HD in august 2021:  -Renal function improved and stable  T2DM -newly diagnosed in august 2021. a1c 6.5.  -CBGs trending upwards.  Increase Lantus to 20 units daily with CBGs with SSI  Chronic constipation Ileus -Continue laxatives  HTN/HLD -bp meds amlodipine on hold, on coreg low dose and is on Midodrine -BP more stable now -DC midodrine today.  Severe hypoalbuminemia -Could be secondary to underlying malignancy and complicated hospitalization -Follow nutrition recommendations  Weakness/debility/physical deconditioning:  Lower extremity weakness from spinal cord involvement -Will eventually need SNF placement.  Social work following -Palliative care following.  DVT prophylaxis: SCDs Code Status: DNR Family Communication:  Sister present at bedside on 04/02/2020  disposition Plan: Status is: Inpatient  Remains inpatient appropriate because:Inpatient level of care appropriate due to severity of illness   Dispo: The patient is from: Home              Anticipated d/c is to: SNF              Anticipated d/c date is: > 3 days              Patient currently is not medically stable to d/c.  Consultants: Neurology/oncology/IR  Procedures: Chest wall mass biopsy on 03/29/2020  Antimicrobials:  Anti-infectives (From admission, onward)   Start     Dose/Rate Route Frequency Ordered Stop   03/20/20 1600  ceFAZolin (ANCEF) IVPB 2g/100 mL premix        2 g 200 mL/hr over 30 Minutes Intravenous Every 8 hours 03/20/20 1505     03/20/20 1000  acyclovir (ZOVIRAX) 200 MG capsule 400 mg        400 mg Oral Daily 03/28/2020 2217     03/20/20 1000  fluconazole (DIFLUCAN) tablet 100 mg        100 mg Oral Daily 03/26/2020 2217 03/23/20 0959   03/20/20 0800  vancomycin (VANCOREADY) IVPB 750 mg/150 mL  Status:  Discontinued        750 mg 150 mL/hr over 60 Minutes Intravenous Every 12 hours 03/15/2020 2217 03/20/20 1452   03/20/20 0800  ceFEPIme (MAXIPIME) 2 g in sodium chloride 0.9 % 100 mL IVPB  Status:  Discontinued        2 g 200 mL/hr over 30 Minutes Intravenous Every 8 hours 03/20/20 0001 03/20/20 1452   03/20/20 0015  ceFEPIme (MAXIPIME) 2 g in sodium chloride 0.9 % 100 mL IVPB        2 g 200 mL/hr over 30 Minutes Intravenous  Once 03/20/20  0001 03/20/20 0159   03/27/2020 2230  vancomycin (VANCOREADY) IVPB 1250 mg/250 mL  Status:  Discontinued        1,250 mg 166.7 mL/hr over 90 Minutes Intravenous  Once 03/29/2020 2217 03/31/2020 2225   03/21/2020 2224  piperacillin-tazobactam (ZOSYN) IVPB 3.375 g  Status:  Discontinued        3.375 g 12.5 mL/hr over 240 Minutes Intravenous Every 8 hours 03/18/2020 2217 03/20/20 0000       Subjective: Patient seen and examined at bedside.  Feels very weak and intermittently short of breath.  Complains of intermittent chest pain. No overnight fever or vomiting reported.  Poor historian.   Objective: Vitals:   04/03/20 1516 04/03/20 1950 04/03/20 2352 04/04/20 0435  BP: 123/69 121/69 123/75 121/80  Pulse: (!) 111 (!) 107 98 95  Resp: 20 (!) _0 Temp: 97.8 F (36.6 C) 98.4 F (36.9 C) 98.4 F (36.9 C) 97.6 F (36.4 C)  TempSrc: Oral Oral Oral Oral  SpO2: 95% 93% 95% 95%  Weight:    62.1 kg  Height:        Intake/Output Summary (Last 24 hours) at 04/04/2020 0736 Last data filed at 04/03/2020 2359 Gross per 24 hour  Intake 1488.05 ml  Output 800 ml  Net 688.05 ml   Filed Weights   03/31/20 0407 04/02/20 0502 04/04/20 0435  Weight: 59.6 kg 61.1 kg 62.1 kg    Examination:  General exam: No acute distress.  Looks very chronically ill.  Poor historian.  Extremely pale looking.   Respiratory system: Bilateral decreased breath sounds at bases intermittent tachypnea.  Some scattered crackles.   Cardiovascular system: S1-S2 heard, rate controlled gastrointestinal system: Abdomen is nondistended, soft and nontender.  Bowel sounds heard  extremities: Bilateral lower extremity trace edema present.  No clubbing Central nervous system: Alert.  Very poor historian.  No focal neurological deficits.  Moves extremities Skin: No obvious ecchymosis/lesions Psychiatry: Extremely flat affect  Data Reviewed: I have personally reviewed following labs and imaging studies  CBC: Recent Labs  Lab 03/31/20 0724 04/01/20 0717 04/02/20 0723 04/03/20 0341 04/04/20 0233  WBC 3.9* 3.7* 4.5 4.1 4.5  NEUTROABS 3.0 3.0 3.5 3.1 3.4  HGB 7.4* 7.4* 7.6* 7.0* 6.9*  HCT 22.4* 21.8* 21.7* 20.5* 19.9*  MCV 88.9 86.9 86.5 86.5 87.3  PLT 114* 101* 95* 74* 59*   Basic Metabolic Panel: Recent Labs  Lab 03/31/20 0724 04/01/20 0717 04/02/20 0723 04/03/20 0341 04/04/20 0233  NA 126* 125* 124* 126* 127*  K 3.7 3.3* 2.8* 3.6 4.2  CL 90* 89* 84* 92* 93*  CO2 _1 GLUCOSE 134*  181* 121* 146* 224*  BUN 19 28* 30* 28* 32*  CREATININE 0.63 0.73 0.64 0.68 0.60*  CALCIUM 9.1 9.3 9.5 9.7 9.7  MG 1.3* 1.6* 1.6* 1.5* 1.9   GFR: Estimated Creatinine Clearance: 83 mL/min (A) (by C-G formula based on SCr of 0.6 mg/dL (L)). Liver Function Tests: Recent Labs  Lab 03/29/20 0040  AST 38  ALT 17  ALKPHOS 78  BILITOT 0.2*  PROT 5.1*  ALBUMIN 1.6*   No results for input(s): LIPASE, AMYLASE in the last 168 hours. No results for input(s): AMMONIA in the last 168 hours. Coagulation Profile: Recent Labs  Lab 03/29/20 0040  INR 1.2   Cardiac Enzymes: No results for input(s): CKTOTAL, CKMB, CKMBINDEX, TROPONINI in the last 168 hours. BNP (last 3 results) No results for input(s): PROBNP in the last 8760 hours.  HbA1C: No results for input(s): HGBA1C in the last 72 hours. CBG: Recent Labs  Lab 04/02/20 2104 04/03/20 0754 04/03/20 1148 04/03/20 1700 04/03/20 2100  GLUCAP 200* 140* 159* 311* 245*   Lipid Profile: No results for input(s): CHOL, HDL, LDLCALC, TRIG, CHOLHDL, LDLDIRECT in the last 72 hours. Thyroid Function Tests: No results for input(s): TSH, T4TOTAL, FREET4, T3FREE, THYROIDAB in the last 72 hours. Anemia Panel: No results for input(s): VITAMINB12, FOLATE, FERRITIN, TIBC, IRON, RETICCTPCT in the last 72 hours. Sepsis Labs: No results for input(s): PROCALCITON, LATICACIDVEN in the last 168 hours.  No results found for this or any previous visit (from the past 240 hour(s)).       Radiology Studies: No results found.      Scheduled Meds: . (feeding supplement) PROSource Plus  30 mL Oral BID BM  . sodium chloride   Intravenous Once  . acetaminophen  1,000 mg Oral TID  . acyclovir  400 mg Oral Daily  . calcium carbonate  400 mg of elemental calcium Oral BID WC  . carvedilol  3.125 mg Oral BID WC  . dexamethasone  4 mg Oral Q12H  . gabapentin  300 mg Oral QHS  . insulin aspart  0-9 Units Subcutaneous TID WC  . insulin aspart  3 Units  Subcutaneous TID WC  . insulin glargine  15 Units Subcutaneous QHS  . lactulose  20 g Oral TID  . levETIRAcetam  500 mg Oral BID  . lidocaine  2 patch Transdermal Q24H  . melatonin  3 mg Oral QHS  . midodrine  2.5 mg Oral Daily  . morphine  30 mg Oral Q12H  . pantoprazole  40 mg Oral BID AC  . Ensure Max Protein  11 oz Oral BID BM  . QUEtiapine  12.5 mg Oral QHS  . senna-docusate  2 tablet Oral BID  . sodium chloride  2 spray Each Nare Q1H while awake  . sodium chloride  1 g Oral TID WC  . sucralfate  1 g Oral TID WC & HS   Continuous Infusions: . sodium chloride 10 mL/hr at 04/04/20 0430  .  ceFAZolin (ANCEF) IV 2 g (04/04/20 0432)          Aline August, MD [facility] 04/04/2020, 7:36 AM

## 2020-04-05 ENCOUNTER — Inpatient Hospital Stay (HOSPITAL_COMMUNITY): Payer: Self-pay

## 2020-04-05 DIAGNOSIS — G893 Neoplasm related pain (acute) (chronic): Secondary | ICD-10-CM

## 2020-04-05 LAB — BASIC METABOLIC PANEL
Anion gap: 9 (ref 5–15)
BUN: 28 mg/dL — ABNORMAL HIGH (ref 6–20)
CO2: 23 mmol/L (ref 22–32)
Calcium: 9.6 mg/dL (ref 8.9–10.3)
Chloride: 95 mmol/L — ABNORMAL LOW (ref 98–111)
Creatinine, Ser: 0.61 mg/dL (ref 0.61–1.24)
GFR calc Af Amer: >60 mL/min (ref >60)
GFR calc non Af Amer: >60 mL/min (ref >60)
Glucose, Bld: 202 mg/dL — ABNORMAL HIGH (ref 70–99)
Potassium: 4.1 mmol/L (ref 3.5–5.1)
Sodium: 127 mmol/L — ABNORMAL LOW (ref 135–145)

## 2020-04-05 LAB — CBC WITH DIFFERENTIAL/PLATELET
Abs Immature Granulocytes: 0.65 10*3/uL — ABNORMAL HIGH (ref 0.00–0.07)
Basophils Absolute: 0 10*3/uL (ref 0.0–0.1)
Basophils Relative: 0 %
Eosinophils Absolute: 0 10*3/uL (ref 0.0–0.5)
Eosinophils Relative: 0 %
HCT: 23.7 % — ABNORMAL LOW (ref 39.0–52.0)
Hemoglobin: 8 g/dL — ABNORMAL LOW (ref 13.0–17.0)
Immature Granulocytes: 12 %
Lymphocytes Relative: 7 %
Lymphs Abs: 0.4 10*3/uL — ABNORMAL LOW (ref 0.7–4.0)
MCH: 29.6 pg (ref 26.0–34.0)
MCHC: 33.8 g/dL (ref 30.0–36.0)
MCV: 87.8 fL (ref 80.0–100.0)
Monocytes Absolute: 0.4 10*3/uL (ref 0.1–1.0)
Monocytes Relative: 7 %
Neutro Abs: 4.1 10*3/uL (ref 1.7–7.7)
Neutrophils Relative %: 74 %
Platelets: 46 10*3/uL — ABNORMAL LOW (ref 150–400)
RBC: 2.7 MIL/uL — ABNORMAL LOW (ref 4.22–5.81)
RDW: 13.3 % (ref 11.5–15.5)
WBC: 5.6 10*3/uL (ref 4.0–10.5)
nRBC: 0.9 % — ABNORMAL HIGH (ref 0.0–0.2)

## 2020-04-05 LAB — TYPE AND SCREEN
ABO/RH(D): O POS
Antibody Screen: NEGATIVE
Unit division: 0

## 2020-04-05 LAB — GLUCOSE, CAPILLARY
Glucose-Capillary: 164 mg/dL — ABNORMAL HIGH (ref 70–99)
Glucose-Capillary: 159 mg/dL — ABNORMAL HIGH (ref 70–99)
Glucose-Capillary: 170 mg/dL — ABNORMAL HIGH (ref 70–99)
Glucose-Capillary: 165 mg/dL — ABNORMAL HIGH (ref 70–99)

## 2020-04-05 LAB — BPAM RBC
Blood Product Expiration Date: 202110292359
ISSUE DATE / TIME: 202109271208
Unit Type and Rh: 5100

## 2020-04-05 LAB — MAGNESIUM: Magnesium: 1.7 mg/dL (ref 1.7–2.4)

## 2020-04-05 MED ORDER — INSULIN ASPART 100 UNIT/ML ~~LOC~~ SOLN
7.0000 [IU] | Freq: Three times a day (TID) | SUBCUTANEOUS | Status: DC
  Administered 2020-04-05: 7 [IU] via SUBCUTANEOUS

## 2020-04-05 NOTE — Progress Notes (Signed)
Physical Therapy Treatment Patient Details Name: Trevor Shelton MRN: [id number] DOB: [birth year] Today's Date: 04/05/2020    History of Present Illness 54 yo male with PMH of HTN and HLD.  Pt with recent admission 02/14/20-03/03/20 with neoplasm consistent with multiple myeloma with multiple spinal lesions, L rib fx 9 and 10, and cord compression.  Pt on Decadron for cord compression.  Pt was at inpt rehab and developed nose bleed and fever. Has been admitted with fever, multiple myeloma, AMS, AKI, and pancytopenia.  Pt found to have small intracrainal hemorrhage and severe sepsis with PNA 9/14 rapid response called transfer to ICU sleep cycle disturbed with visaul hallunications TEE cancelled EEG moderate diffuse encephalopathy    PT Comments     Pt always agreeable to working with therapy and wants to get to chair to sit up. PT/OT able to convince pt to use his strength for functional activities. Pt able to come to EoB with maxAx2. In sitting with posterior support pt able to work with OT on self care. Pt also able to work on some trunk and neck rotational movements and assisted chest opening stretches. Pt limited by constant R shoulder and rib pain. Returned pt to bed and left pt in sidelying as he was comfortable on his L side. Utilized in-house interpreter for session. Goals of care updated today for current level of function. D/c plans remain appropriate. PT will continue to follow acutely.   Follow Up Recommendations  SNF     Equipment Recommendations  Wheelchair (measurements PT);Wheelchair cushion (measurements PT);3in1 (PT)       Precautions / Restrictions Precautions Precautions: Fall Precaution Comments: Significant BLE weakness and ataxia-like movements; back and rib precautions for comfort due to fxs Restrictions Weight Bearing Restrictions: No Other Position/Activity Restrictions: Patient tends to lean L    Mobility  Bed Mobility Overal bed mobility: Needs Assistance Bed  Mobility: Supine to Sit     Supine to sit: +2 for physical assistance;Max assist     General bed mobility comments: cues for LE management of LE to EoB and reaching for bedrail, pt requires maxA for coming to EoB and for pad scoot of hips to EoB      Modified Rankin (Stroke Patients Only) Modified Rankin (Stroke Patients Only) Pre-Morbid Rankin Score: Severe disability Modified Rankin: Severe disability     Balance Overall balance assessment: Needs assistance Sitting-balance support: Bilateral upper extremity supported;Feet supported Sitting balance-Leahy Scale: Poor Sitting balance - Comments: requires UE support to maintain seated balance, provided posterior support so pt could work on ADLs while sitting EoB                                    Cognition Arousal/Alertness: Awake/alert Behavior During Therapy: WFL for tasks assessed/performed Overall Cognitive Status: Within Functional Limits for tasks assessed                                        Exercises General Exercises - Lower Extremity Ankle Circles/Pumps: AAROM;Both;5 reps;Supine Long Arc Quad: Both;5 reps;AROM;Seated Hip Flexion/Marching: AAROM;Both;5 reps;Seated    General Comments General comments (skin integrity, edema, etc.): HR max noted 1105 bpm, lots of work on breathing cadence to improve oxygenation as SaO2 drops to 88%O2 with pt maximal effort at movement for threx and ADLs      Pertinent Vitals/Pain  Pain Assessment: Faces Faces Pain Scale: Hurts little more Pain Location: back, chest, and shoulders Pain Descriptors / Indicators: Discomfort;Grimacing Pain Intervention(s): Monitored during session;Limited activity within patient's tolerance;Repositioned           PT Goals (current goals can now be found in the care plan section) Acute Rehab PT Goals Patient Stated Goal: to get to chair  PT Goal Formulation: With patient/family Time For Goal Achievement:  04/19/20 Potential to Achieve Goals: Fair Progress towards PT goals: Progressing toward goals    Frequency    Min 2X/week      PT Plan Discharge plan needs to be updated    Co-evaluation PT/OT/SLP Co-Evaluation/Treatment: Yes Reason for Co-Treatment: Complexity of the patient's impairments (multi-system involvement) PT goals addressed during session: Mobility/safety with mobility OT goals addressed during session: ADL's and self-care      AM-PAC PT "6 Clicks" Mobility   Outcome Measure  Help needed turning from your back to your side while in a flat bed without using bedrails?: A Lot Help needed moving from lying on your back to sitting on the side of a flat bed without using bedrails?: Total Help needed moving to and from a bed to a chair (including a wheelchair)?: Total Help needed standing up from a chair using your arms (e.g., wheelchair or bedside chair)?: Total Help needed to walk in hospital room?: Total Help needed climbing 3-5 steps with a railing? : Total 6 Click Score: 7    End of Session Equipment Utilized During Treatment: Gait belt Activity Tolerance: Patient limited by pain;Patient limited by fatigue Patient left: in bed;with call bell/phone within reach;with family/visitor present;with bed alarm set Nurse Communication: Mobility status PT Visit Diagnosis: Muscle weakness (generalized) (M62.81);Difficulty in walking, not elsewhere classified (R26.2);Other abnormalities of gait and mobility (R26.89)     Time: 1330-1405 PT Time Calculation (min) (ACUTE ONLY): 35 min  Charges:  $Therapeutic Exercise: 8-22 mins                     Omari Koslosky B. Migdalia Dk PT, DPT Acute Rehabilitation Services Pager [phone] Office [phone]    [place] 04/05/2020, 4:38 PM

## 2020-04-05 NOTE — Progress Notes (Signed)
Occupational Therapy Treatment Patient Details Name: Trevor Shelton MRN: [id number] DOB: May 30, 1966 Today's Date: 04/05/2020    History of present illness 54 yo male with PMH of HTN and HLD.  Pt with recent admission 02/14/20-03/03/20 with neoplasm consistent with multiple myeloma with multiple spinal lesions, L rib fx 9 and 10, and cord compression.  Pt on Decadron for cord compression.  Pt was at inpt rehab and developed nose bleed and fever. Has been admitted with fever, multiple myeloma, AMS, AKI, and pancytopenia.  Pt found to have small intracrainal hemorrhage and severe sepsis with PNA 9/14 rapid response called transfer to ICU sleep cycle disturbed with visaul hallunications TEE cancelled EEG moderate diffuse encephalopathy   OT comments  Patient wanting to get to the recliner, but OT/PT asked the patient to trial EOB sitting, and have him perform self care in sit.  Patient was able to perform seated grooming, UB and LB bathing to the tops of his knees with setup, balance assist, cues for rest breaks and deep breathing.  Patient required some assist for thoroughness.  Patient was returned to side lying with all needs in reach and spouse at bedside.  Spanish interpreter used, see PT for name and details.  Very productive treatment for this patient, he is motivated but deficits mentioned below significantly impact his functional status.  OT to continue to follow in the acute setting.  SNF is recommended for post acute rehab.    Follow Up Recommendations  SNF    Equipment Recommendations  3 in 1 bedside commode;Wheelchair (measurements OT);Wheelchair cushion (measurements OT);Hospital bed;Other (comment)    Recommendations for Other Services      Precautions / Restrictions Precautions Precautions: Fall Precaution Comments: Significant BLE weakness and ataxia-like movements; back and rib precautions for comfort due to fxs Restrictions Weight Bearing Restrictions: No Other Position/Activity  Restrictions: Patient tends to lean L                                                                       ADL       Grooming: Wash/dry hands;Wash/dry face;Set up;Min guard;Sitting Grooming Details (indicate cue type and reason): assist for sitting balance throughout Upper Body Bathing: Minimal assistance;Sitting   Lower Body Bathing: Moderate assistance;Sitting/lateral leans   Upper Body Dressing : Moderate assistance;Sitting   Lower Body Dressing: Maximal assistance;Sitting/lateral leans Lower Body Dressing Details (indicate cue type and reason): patient unalbe to support himslef in sit without external assist.  Leaning to the left.     Toileting- Clothing Manipulation and Hygiene: Maximal assistance;Bed level       Functional mobility during ADLs: +2 for physical assistance;Moderate assistance                         Cognition Arousal/Alertness: Awake/alert Behavior During Therapy: WFL for tasks assessed/performed Overall Cognitive Status: Within Functional Limits for tasks assessed                                                            Pertinent Vitals/  Pain       Faces Pain Scale: Hurts little more Pain Location: back, chest, and shoulders Pain Descriptors / Indicators: Discomfort;Grimacing Pain Intervention(s): Monitored during session;Repositioned                                                          Frequency  Min 2X/week        Progress Toward Goals  OT Goals(current goals can now be found in the care plan section)  Progress towards OT goals: Progressing toward goals  Acute Rehab OT Goals Patient Stated Goal: to get to chair  OT Goal Formulation: With patient Time For Goal Achievement: 04/11/20 Potential to Achieve Goals: [place] Discharge plan remains appropriate    Co-evaluation    PT/OT/SLP Co-Evaluation/Treatment: Yes Reason for  Co-Treatment: Complexity of the patient's impairments (multi-system involvement);For patient/therapist safety;To address functional/ADL transfers   OT goals addressed during session: ADL's and self-care;Strengthening/ROM      AM-PAC OT "6 Clicks" Daily Activity     Outcome Measure   Help from another person eating meals?: A Little Help from another person taking care of personal grooming?: A Little Help from another person toileting, which includes using toliet, bedpan, or urinal?: A Lot Help from another person bathing (including washing, rinsing, drying)?: A Lot Help from another person to put on and taking off regular upper body clothing?: A Lot Help from another person to put on and taking off regular lower body clothing?: A Lot 6 Click Score: 14    End of Session    OT Visit Diagnosis: Unsteadiness on feet (R26.81);Other abnormalities of gait and mobility (R26.89);Other symptoms and signs involving the nervous system (R29.898);Muscle weakness (generalized) (M62.81);Pain;Feeding difficulties (R63.3);Other symptoms and signs involving cognitive function Pain - Right/Left: Right Pain - part of body: Shoulder   Activity Tolerance Patient limited by fatigue   Patient Left in bed;with call bell/phone within reach;with bed alarm set;with family/visitor present   Nurse Communication          Time: 1331-1410 OT Time Calculation (min): 39 min  Charges: OT General Charges $OT Visit: 1 Visit OT Treatments $Self Care/Home Management : 23-37 mins  04/05/2020  Rich, OTR/L  Acute Rehabilitation Services  Office:  [phone]    Metta Clines 04/05/2020, 2:43 PM

## 2020-04-05 NOTE — Progress Notes (Signed)
Inpatient Diabetes Program Recommendations  AACE/ADA: New Consensus Statement on Inpatient Glycemic Control (2015)  Target Ranges:  Prepandial:   less than 140 mg/dL      Peak postprandial:   less than 180 mg/dL (1-2 hours)      Critically ill patients:  140 - 180 mg/dL   Lab Results  Component Value Date   GLUCAP 164 (H) 04/05/2020   HGBA1C 6.5 (H) 02/18/2020    Review of Glycemic Control Results for DANISH, RUFFINS (MRN [id number]) as of 04/05/2020 11:21  Ref. Range 04/04/2020 15:40 04/04/2020 17:14 04/04/2020 21:27 04/05/2020 07:05  Glucose-Capillary Latest Ref Range: 70 - 99 mg/dL 247 (H) 244 (H) 213 (H) 164 (H)   Inpatient Diabetes Program Recommendations:    Consider increasing Novolog 7 units TID (assuming patient is consuming >50% of meals).    Thanks, Bronson Curb, MSN, RNC-OB Diabetes Coordinator [phone] (8a-5p)

## 2020-04-05 NOTE — Progress Notes (Addendum)
Patient in severe abdominal discomfort. He states that he feel very distended and feels like he is going to explode. States he was not had a bowel movement in 2 days. Paged TRAID, got orders for abdominal scan. Gave pain medications and laxative, will continue to monitor patient closely.   Update: patient received soap suds enema, had large bowel movement. States he feels slightly better. Will continue to monitor patient.

## 2020-04-05 NOTE — Progress Notes (Signed)
Patient ID: Trevor Shelton, male   DOB: 28-Sep-1965, 54 y.o.   MRN: [id number]  PROGRESS NOTE    Trevor Shelton  EPP:[id number] DOB: Jul 08, 1966 DOA: 03/29/2020 PCP: Patient, No Pcp Per   Brief Narrative:  54 year old male with very long/complex hospitalization at [facility] long hospital recently in 02/2020 with back pain and was found to have diffuse lytic lesions throughout the skeleton, epidural tumor extension with pathological fracture of T6 and T7, contrast-enhancing mass replacing most of T5 vertebral body with severe cord compression, and acute renal failure, required couple of hemodialysis sessions, kidney function improved. --Followed by medical and radiation oncology, treated with IV Decadron and has completed 10/10 doses of XRT. Work-up consistent with newly diagnosed IgG kappa MM with plans for CyBorD chemo to start following completion of XRT.  -Following transfer to rehab, he underwent chemotherapy on 03/15/2020 and next chemotherapy was scheduled for 03/22/2020.Patient had significant nosebleed on 03/17/2020. -Subsequently spiked fevers with T-max 102 -blood culture grew MSSA bacteremia, started on IV Ancef seen by ID as well for this -During this hospitalization; he was also noted to have a 5 x 2 cm left upper lobe chest wall mass on CT. -On 03/21/2020: He was found to be lethargic/less responsive.  Seen by neurology: Suspicious for seizure activity.  Started on Keppra.  EEG negative.  MRI noted to have acute intracranial hemorrhage in the right frontal convexity, followed by neurology for this.  Mental status slowly improving. -Patient has been complaining of chest pain throughout at the site of his chest wall mass.  CTA negative for PE [facility] course complicated by ileus and pleural effusion from third spacing, now improving -03/28/2020: Received platelet transfusion.  03/29/2020: CT-guided biopsy of left anterior chest wall mass  Assessment & Plan:   Acute intracranial hemorrhage -On 9/12  patient was noted to be disoriented, CT head noted right frontal lesion concerning for hemorrhage , MRI noted acute hemorrhage involving anterior-inferior right frontal convexity, this is felt to be secondary to severe thrombocytopenia from effect of multiple myeloma and chemotherapy.  -Repeat CT head decreased size of hemorrhage -Appreciate neurology input, recommended baby aspirin when platelet count is higher and cleared by oncology -transfuse plst if <20K or actively bleeding as per oncology -Mental status has improved and stabilized  Severe sepsis: Present on admission MSSA bacteremia -Currently sepsis has resolved.  Repeat blood cultures have been negative.  2D echo was unremarkable for vegetations.  TEE deferred especially in the setting of severe thrombocytopenia and complicated hospitalization -Currently on IV Ancef.  ID recommended to continue Ancef through 04/04/2020.  DC Ancef. -Currently afebrile.  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  -Rx w/ velcade and dexamethasone on 9/7, acyclovir, diflucan. -Oncology following, further chemotherapy on hold with acute infection -Remains on Decadron every other day and prophylactic acyclovir -oncology reevaluation appreciated: Considering restarting treatment this week if his overall clinical status and counts stabilize.  Awaiting further recommendations. -Palliative care following.  Pleural-based soft tissue mass noted on recent CT -Pathology of biopsy showed mixed inflammation, debris and hemorrhage with no clear signs of malignancy.  Pancytopenia/anemia/severe thrombocytopenia -Status post multiple units of packed red  cells transfusion and platelet transfusion during this hospitalization -Monitor.  Transfuse PRBC  if hemoglobin less than 7 and platelets less than 10K or active bleeding -Hemoglobin 8 and platelets 46 today.  Status post 1 more unit of packed red cell transfusion on 04/04/2020.    Hyponatremia -Questionable cause.  Sodium 127 today.  Lasix discontinued on 04/02/2020.  Salt tablets started on 04/02/2020.  Continue  Hypokalemia -Improved.  Hypomagnesemia -Improved.  Acute encephalopathy multifactorial -Became lethargic and more confused on 03/20/2020.  There was a question of seizure.  He was transferred to neuro ICU on 03/22/2020 and started on Keppra.  EEG showed moderate encephalopathy with no epileptiform discharges. -Currently mental status stable.  Neurology has signed off.  Continue Keppra.  Epistaxis -Resolved.  Continue Afrin as needed  Chest pain/diffuse body pain -Secondary to diffuse bony involvement and chest wall mass.  EKG unremarkable.  Troponins negative.  CTA negative for PE.  Continue narcotics as needed.  Palliative care following and helping with pain management as well.  Pleural effusions Third spacing: -Lasix discontinued on 04/02/2020.  AKI/Short term HD in august 2021:  -Renal function improved and stable  T2DM -newly diagnosed in august 2021. a1c 6.5.  -Continue Lantus along with NovoLog and CBGs with SSI.  Chronic constipation Ileus -Continue laxatives  HTN/HLD -bp meds amlodipine on hold, on coreg low dose and is on Midodrine -BP more stable now -DC'd midodrine on 04/04/2020.  Severe hypoalbuminemia -Could be secondary to underlying malignancy and complicated hospitalization -Follow nutrition recommendations  Weakness/debility/physical deconditioning:  Lower extremity weakness from spinal cord involvement -Will eventually need SNF placement.  Social work following -Palliative care following.  Overall prognosis is very poor.  If the patient  cannot tolerate further chemotherapy, consider hospice/comfort measures  DVT prophylaxis: SCDs Code Status: DNR Family Communication: Sister present at bedside on 04/02/2020  disposition Plan: Status is: Inpatient  Remains inpatient appropriate because:Inpatient level of care appropriate due to severity of illness   Dispo: The patient is from: Home              Anticipated d/c is to: SNF              Anticipated d/c date is: > 3 days              Patient currently is not medically stable to d/c.  Consultants: Neurology/oncology/IR  Procedures: Chest wall mass biopsy on 03/29/2020  Antimicrobials:  Anti-infectives (From admission, onward)   Start     Dose/Rate Route Frequency Ordered Stop   04/05/20 0100  ceFAZolin (ANCEF) IVPB 2g/100 mL premix        2 g 200 mL/hr over 30 Minutes Intravenous Every 8 hours 04/04/20 1802     03/20/20 1600  ceFAZolin (ANCEF) IVPB 2g/100 mL premix  Status:  Discontinued        2 g 200 mL/hr over 30 Minutes Intravenous Every 8 hours 03/20/20 1505 04/04/20 1802   03/20/20 1000  acyclovir (ZOVIRAX) 200 MG capsule 400 mg        400 mg Oral Daily 04/06/2020 2217     03/20/20 1000  fluconazole (DIFLUCAN) tablet 100 mg        100 mg Oral Daily 04/07/2020 2217 03/23/20 0959   03/20/20 0800  vancomycin (VANCOREADY) IVPB 750 mg/150 mL  Status:  Discontinued        750 mg 150 mL/hr over 60 Minutes Intravenous Every 12 hours 04/02/2020 2217 03/20/20 1452   03/20/20 0800  ceFEPIme (MAXIPIME) 2 g in sodium chloride 0.9 % 100 mL IVPB  Status:  Discontinued  2 g 200 mL/hr over 30 Minutes Intravenous Every 8 hours 03/20/20 0001 03/20/20 1452   03/20/20 0015  ceFEPIme (MAXIPIME) 2 g in sodium chloride 0.9 % 100 mL IVPB        2 g 200 mL/hr over 30 Minutes Intravenous  Once 03/20/20 0001 03/20/20 0159   04/04/2020 2230  vancomycin (VANCOREADY) IVPB 1250 mg/250 mL  Status:  Discontinued        1,250 mg 166.7 mL/hr over 90 Minutes Intravenous  Once 03/23/2020 2217  03/27/2020 2225   04/04/2020 2224  piperacillin-tazobactam (ZOSYN) IVPB 3.375 g  Status:  Discontinued        3.375 g 12.5 mL/hr over 240 Minutes Intravenous Every 8 hours 04/03/2020 2217 03/20/20 0000       Subjective: Patient seen and examined at bedside.  Poor historian.  Complains of intermittent whole-body pain.  No overnight fever, vomiting reported.  Denies worsening shortness of breath. Objective: Vitals:   04/04/20 1628 04/04/20 1644 04/04/20 2021 04/05/20 0400  BP: 139/87 (!) 146/90 132/78 134/81  Pulse: 94 95 91 98  Resp: 17 18 (!) 23 20  Temp: 98 F (36.7 C) 98.7 F (37.1 C) 97.8 F (36.6 C) 97.6 F (36.4 C)  TempSrc: Oral Oral Oral Oral  SpO2: 95% 94% 95% 97%  Weight:    60.1 kg  Height:        Intake/Output Summary (Last 24 hours) at 04/05/2020 0731 Last data filed at 04/05/2020 0426 Gross per 24 hour  Intake 1446.83 ml  Output 5300 ml  Net -3853.17 ml   Filed Weights   04/02/20 0502 04/04/20 0435 04/05/20 0400  Weight: 61.1 kg 62.1 kg 60.1 kg    Examination:  General exam: No distress.  Extremely ill looking. Poor historian.  Looks pale.  Respiratory system: Bilateral decreased breath sounds at bases with some crackles and intermittently tachypneic  cardiovascular system: Rate controlled, S1-S2 heard  gastrointestinal system: Abdomen is nondistended, soft and nontender.  Normal bowel sounds heard  extremities: No cyanosis.  Bilateral lower extremity trace edema present Central nervous system: Awake.  Poor historian.  No focal neurological deficits.  Moving extremities.   Skin: No obvious petechiae/ulcers  psychiatry: Affect is flat   data Reviewed: I have personally reviewed following labs and imaging studies  CBC: Recent Labs  Lab 04/01/20 0717 04/01/20 0717 04/02/20 0723 04/03/20 0341 04/04/20 0233 04/04/20 2130 04/05/20 0230  WBC 3.7*   < > 4.5 4.1 4.5 6.3 5.6  NEUTROABS 3.0  --  3.5 3.1 3.4  --  4.1  HGB 7.4*   < > 7.6* 7.0* 6.9* 8.2* 8.0*    HCT 21.8*   < > 21.7* 20.5* 19.9* 24.5* 23.7*  MCV 86.9   < > 86.5 86.5 87.3 87.8 87.8  PLT 101*   < > 95* 74* 59* 52* 46*   < > = values in this interval not displayed.   Basic Metabolic Panel: Recent Labs  Lab 04/01/20 0717 04/02/20 0723 04/03/20 0341 04/04/20 0233 04/05/20 0230  NA 125* 124* 126* 127* 127*  K 3.3* 2.8* 3.6 4.2 4.1  CL 89* 84* 92* 93* 95*  CO2 _0 GLUCOSE 181* 121* 146* 224* 202*  BUN 28* 30* 28* 32* 28*  CREATININE 0.73 0.64 0.68 0.60* 0.61  CALCIUM 9.3 9.5 9.7 9.7 9.6  MG 1.6* 1.6* 1.5* 1.9 1.7   GFR: Estimated Creatinine Clearance: 83 mL/min (by C-G formula based on SCr of 0.61 mg/dL). Liver Function Tests:  No results for input(s): AST, ALT, ALKPHOS, BILITOT, PROT, ALBUMIN in the last 168 hours. No results for input(s): LIPASE, AMYLASE in the last 168 hours. No results for input(s): AMMONIA in the last 168 hours. Coagulation Profile: No results for input(s): INR, PROTIME in the last 168 hours. Cardiac Enzymes: No results for input(s): CKTOTAL, CKMB, CKMBINDEX, TROPONINI in the last 168 hours. BNP (last 3 results) No results for input(s): PROBNP in the last 8760 hours. HbA1C: No results for input(s): HGBA1C in the last 72 hours. CBG: Recent Labs  Lab 04/04/20 1103 04/04/20 1540 04/04/20 1714 04/04/20 2127 04/05/20 0705  GLUCAP 253* 247* 244* 213* 164*   Lipid Profile: No results for input(s): CHOL, HDL, LDLCALC, TRIG, CHOLHDL, LDLDIRECT in the last 72 hours. Thyroid Function Tests: No results for input(s): TSH, T4TOTAL, FREET4, T3FREE, THYROIDAB in the last 72 hours. Anemia Panel: No results for input(s): VITAMINB12, FOLATE, FERRITIN, TIBC, IRON, RETICCTPCT in the last 72 hours. Sepsis Labs: No results for input(s): PROCALCITON, LATICACIDVEN in the last 168 hours.  No results found for this or any previous visit (from the past 240 hour(s)).       Radiology Studies: No results found.      Scheduled Meds: .  (feeding supplement) PROSource Plus  30 mL Oral BID BM  . sodium chloride   Intravenous Once  . acetaminophen  1,000 mg Oral TID  . acyclovir  400 mg Oral Daily  . calcium carbonate  400 mg of elemental calcium Oral BID WC  . carvedilol  3.125 mg Oral BID WC  . dexamethasone  4 mg Oral Q12H  . gabapentin  300 mg Oral QHS  . insulin aspart  0-9 Units Subcutaneous TID WC  . insulin aspart  5 Units Subcutaneous TID WC  . insulin glargine  20 Units Subcutaneous QHS  . lactulose  20 g Oral TID  . levETIRAcetam  500 mg Oral BID  . lidocaine  2 patch Transdermal Q24H  . melatonin  3 mg Oral QHS  . morphine  30 mg Oral Q12H  . pantoprazole  40 mg Oral BID AC  . Ensure Max Protein  11 oz Oral BID BM  . QUEtiapine  12.5 mg Oral QHS  . senna-docusate  2 tablet Oral BID  . sodium chloride  2 spray Each Nare Q1H while awake  . sodium chloride  1 g Oral TID WC  . sucralfate  1 g Oral TID WC & HS   Continuous Infusions: . sodium chloride 10 mL/hr at 04/04/20 0430  .  ceFAZolin (ANCEF) IV 2 g (04/05/20 0000)          Aline August, MD [facility] 04/05/2020, 7:31 AM

## 2020-04-05 NOTE — Progress Notes (Signed)
[place] [phone]   [phone]  PROGRESS NOTE  Patient Care Team: Patient, No Pcp Per as PCP - General (General Practice)  Hematological/Oncological History # Lytic Lesions Throughout Skeleton/Hypercalcemia # Pathologic Fractures of Spine/Soft Tissue Masses in Spine # Multiple Myeloma Confirmed by Bone Marrow Biopsy 1) 02/14/2020: presented to the emergency department with markedly worsening back pain. 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 The T6 lesion severely narrows the thecal sac and deforms the spinal cord. 3) 02/18/2020: establish care with Dr. Lorenso Courier   HISTORY OF PRESENTING ILLNESS:  Trevor Shelton 54 y.o. male with medical history significant for HTN and HLD who presented with back pain and was found to have cord compression and lytic lesions consistent with multiple myeloma.   Interval History: --continue to feel weak, strength is not returning --blood counts continue trending downward --today discussed that chemotherapy moving forward does not look like a viable option. He voiced his understanding.  --O2 sat borderline on exam today (approx 90%).    MEDICAL HISTORY:  Past Medical History:  Diagnosis Date   AKI (acute kidney injury) ([place]) 02/2020   Hypercalcemia 02/2020   Hypercholesteremia    Hypertension     SURGICAL HISTORY: Past Surgical History:  Procedure Laterality Date   HERNIA REPAIR     IR FLUORO GUIDE CV LINE RIGHT  02/17/2020   IR REMOVAL TUN CV CATH W/O FL  03/01/2020   IR US GUIDE VASC ACCESS RIGHT  02/17/2020    SOCIAL HISTORY: Social History   Socioeconomic History   Marital status: Married    Spouse name: Not on file   Number of children: Not on file   Years of education: Not on file   Highest education level: Not on file  Occupational History   Not on file    Tobacco Use   Smoking status: Never Smoker   Smokeless tobacco: Never Used  Vaping Use   Vaping Use: Never used  Substance and Sexual Activity   Alcohol use: Not Currently   Drug use: Not Currently   Sexual activity: Not Currently  Other Topics Concern   Not on file  Social History Narrative   Not on file   Social Determinants of Health   Financial Resource Strain:    Difficulty of Paying Living Expenses: Not on file  Food Insecurity:    Worried About Running Out of Food in the Last Year: Not on file   [employer] of Food in the Last Year: Not on file  Transportation Needs:    Lack of Transportation (Medical): Not on file   Lack of Transportation (Non-Medical): Not on file  Physical Activity:    Days of Exercise per Week: Not on file   Minutes of Exercise per Session: Not on file  Stress:    Feeling of Stress : Not on file  Social Connections:    Frequency of Communication with Friends and Family: Not on file   Frequency of Social Gatherings with Friends and Family: Not on file   Attends Religious Services: Not on file   Active Member of Clubs or Organizations: Not on file   Attends Archivist Meetings: Not on file   Marital Status: Not on file  Intimate Partner Violence:    Fear of Current or Ex-Partner: Not on file   Emotionally Abused: Not on file   Physically Abused:  Not on file   Sexually Abused: Not on file    FAMILY HISTORY: Family History  Problem Relation Age of Onset   Alzheimer's disease Mother    Diabetes Father    Diabetes Brother     ALLERGIES:  is allergic to other and penicillins.  MEDICATIONS:  Current Facility-Administered Medications  Medication Dose Route Frequency Provider Last Rate Last Admin   (feeding supplement) PROSource Plus liquid 30 mL  30 mL Oral BID BM Li, Na, MD   30 mL at 04/05/20 1553   0.9 %  sodium chloride infusion (Manually program via Guardrails IV Fluids)   Intravenous Once Opyd, Ilene Qua, MD       0.9 %   sodium chloride infusion   Intravenous PRN Domenic Polite, MD 10 mL/hr at 04/04/20 0430 Restarted at 04/04/20 0430   acetaminophen (TYLENOL) tablet 1,000 mg  1,000 mg Oral TID Pershing Proud, NP   0,034 mg at 04/05/20 2203   acyclovir (ZOVIRAX) 200 MG capsule 400 mg  400 mg Oral Daily Nicoletta Dress, Na, MD   400 mg at 04/05/20 0950   albuterol (PROVENTIL) (2.5 MG/3ML) 0.083% nebulizer solution 2.5 mg  2.5 mg Nebulization TID PRN Antonieta Pert, MD       alum & mag hydroxide-simeth (MAALOX/MYLANTA) 200-200-20 MG/5ML suspension 30 mL  30 mL Oral Q4H PRN Nicoletta Dress, Na, MD   30 mL at 03/28/20 2051   bisacodyl (DULCOLAX) suppository 10 mg  10 mg Rectal Daily PRN Charlann Lange, MD       calcium carbonate (TUMS - dosed in mg elemental calcium) chewable tablet 400 mg of elemental calcium  400 mg of elemental calcium Oral BID WC Pershing Proud, NP   917 mg of elemental calcium at 04/05/20 1603   carvedilol (COREG) tablet 3.125 mg  3.125 mg Oral BID WC Li, Na, MD   3.125 mg at 04/05/20 1603   dexamethasone (DECADRON) tablet 4 mg  4 mg Oral Q12H Dellinger, Marianne L, PA-C   4 mg at 04/05/20 2203   diphenhydrAMINE (BENADRYL) 12.5 MG/5ML elixir 12.5-25 mg  12.5-25 mg Oral Q6H PRN Nicoletta Dress, Na, MD       docusate sodium (COLACE) capsule 200 mg  200 mg Oral BID Shahmehdi, Seyed A, MD       gabapentin (NEURONTIN) capsule 300 mg  300 mg Oral QHS Dellinger, Marianne L, PA-C   300 mg at 04/05/20 2204   guaiFENesin-dextromethorphan (ROBITUSSIN DM) 100-10 MG/5ML syrup 5-10 mL  5-10 mL Oral Q6H PRN Nicoletta Dress, Na, MD       insulin aspart (novoLOG) injection 0-9 Units  0-9 Units Subcutaneous TID WC Nicoletta Dress, Na, MD   2 Units at 04/05/20 1633   insulin aspart (novoLOG) injection 7 Units  7 Units Subcutaneous TID WC Aline August, MD   7 Units at 04/05/20 1634   insulin glargine (LANTUS) injection 20 Units  20 Units Subcutaneous QHS Aline August, MD   20 Units at 04/05/20 2219   lactulose (CHRONULAC) 10 GM/15ML solution 20 g  20 g Oral TID Domenic Polite, MD   20  g at 04/05/20 2203   levETIRAcetam (KEPPRA) tablet 500 mg  500 mg Oral BID Garvin Fila, MD   500 mg at 04/05/20 2203   lidocaine (LIDODERM) 5 % 2 patch  2 patch Transdermal Q24H Nicoletta Dress, Na, MD   2 patch at 04/05/20 0809   lidocaine (XYLOCAINE) 2 % jelly   Topical PRN Charlann Lange, MD       lidocaine (  XYLOCAINE) 2 % viscous mouth solution 15 mL  15 mL Mouth/Throat Q6H PRN Nicoletta Dress, Na, MD       LORazepam (ATIVAN) tablet 0.5 mg  0.5 mg Oral BID PRN Dellinger, Haynes Dage L, PA-C   0.5 mg at 04/06/20 0114   magic mouthwash w/lidocaine  5 mL Oral QID PRN Charlann Lange, MD       melatonin tablet 3 mg  3 mg Oral QHS Kc, Maren Beach, MD   3 mg at 04/05/20 2203   morphine (MS CONTIN) 12 hr tablet 30 mg  30 mg Oral Q12H Dellinger, Marianne L, PA-C   30 mg at 04/05/20 2203   morphine 4 MG/ML injection 4 mg  4 mg Intravenous Q2H PRN Dellinger, Haynes Dage L, PA-C   4 mg at 04/06/20 0635   ondansetron (ZOFRAN) tablet 4 mg  4 mg Oral Q6H PRN Charlann Lange, MD       Or   ondansetron (ZOFRAN) injection 4 mg  4 mg Intravenous Q6H PRN Nicoletta Dress, Na, MD       ondansetron (ZOFRAN) tablet 4 mg  4 mg Oral Q6H PRN Nicoletta Dress, Na, MD       oxymetazoline (AFRIN) 0.05 % nasal spray 1 spray  1 spray Each Nare BID PRN Kc, Maren Beach, MD   1 spray at 04/04/20 1120   pantoprazole (PROTONIX) EC tablet 40 mg  40 mg Oral BID AC Li, Na, MD   40 mg at 04/05/20 1603   polyethylene glycol (MIRALAX / GLYCOLAX) packet 17 g  17 g Oral Daily PRN Nicoletta Dress, Na, MD       prochlorperazine (COMPAZINE) tablet 5-10 mg  5-10 mg Oral Q6H PRN Nicoletta Dress, Na, MD       Or   prochlorperazine (COMPAZINE) suppository 12.5 mg  12.5 mg Rectal Q6H PRN Nicoletta Dress, Na, MD       protein supplement (ENSURE MAX) liquid  11 oz Oral BID BM Domenic Polite, MD   11 oz at 04/05/20 1553   QUEtiapine (SEROQUEL) tablet 12.5 mg  12.5 mg Oral QHS Garvin Fila, MD   12.5 mg at 04/05/20 2203   senna-docusate (Senokot-S) tablet 2 tablet  2 tablet Oral BID Nicoletta Dress, Na, MD   2 tablet at 04/05/20 2203   simethicone (MYLICON) chewable tablet 80 mg   80 mg Oral QID PRN Charlann Lange, MD   80 mg at 04/04/20 2321   sodium bicarbonate/sodium chloride mouthwash 1075m   Mouth Rinse PRN LNicoletta Dress Na, MD       sodium chloride (OCEAN) 0.65 % nasal spray 2 spray  2 spray Each Nare Q1H while awake Li, Na, MD   2 spray at 04/05/20 2219   sodium chloride tablet 1 g  1 g Oral TID WC Alekh, Kshitiz, MD   1 g at 04/05/20 1604   sorbitol 70 % solution 30 mL  30 mL Oral Daily PRN LNicoletta Dress Na, MD       sucralfate (CARAFATE) 1 GM/10ML suspension 1 g  1 g Oral TID WC & HS Li, Na, MD   1 g at 04/05/20 2203   white petrolatum (VASELINE) gel   Topical PRN LCharlann Lange MD   0.2 application at 012/24/822124    REVIEW OF SYSTEMS:   Constitutional: ( - ) fevers, ( - )  chills , ( - ) night sweats Eyes: ( - ) blurriness of vision, ( - ) double vision, ( - ) watery eyes Ears, nose, mouth, throat, and face: (-) Epistaxis ( - ) mucositis, ( - )  sore throat Respiratory: ( - ) cough, ( - ) dyspnea, ( - ) wheezes Cardiovascular: ( - ) palpitation, ( - ) chest discomfort, ( - ) lower extremity swelling Gastrointestinal:  ( - ) nausea, ( - ) heartburn, ( - ) change in bowel habits Skin: ( - ) abnormal skin rashes Lymphatics: ( - ) new lymphadenopathy, ( - ) easy bruising Neurological: ( - ) numbness, ( - ) tingling, ( - ) new weaknesses Behavioral/Psych: ( - ) mood change, ( - ) new changes  All other systems were reviewed with the patient and are negative.  PHYSICAL EXAMINATION:  GENERAL: Chronically ill-appearing male, laying in bed SKIN: skin color, texture, turgor are normal, no rashes or significant lesions OROPHARYNX: without abnormality  EYES: conjunctiva are pink and non-injected, sclera non-injected LUNGS: Diminished breath sounds bilaterally HEART: Regular rate and rhythm ABDOMEN: Positive bowel sounds, distended Musculoskeletal: no cyanosis of digits and no clubbing.  5/5 strength in the right lower extremity and 3/5 strength in the left lower extremity. PSYCH: alert &  oriented x 3, fluent speech  LABORATORY DATA:  I have reviewed the data as listed CBC Latest Ref Rng & Units 04/06/2020 04/05/2020 04/04/2020  WBC 4.0 - 10.5 K/uL 7.1 5.6 6.3  Hemoglobin 13.0 - 17.0 g/dL 8.8(L) 8.0(L) 8.2(L)  Hematocrit 39 - 52 % 26.3(L) 23.7(L) 24.5(L)  Platelets 150 - 400 K/uL 33(L) 46(L) 52(L)    CMP Latest Ref Rng & Units 04/05/2020 04/04/2020 04/03/2020  Glucose 70 - 99 mg/dL 202(H) 224(H) 146(H)  BUN 6 - 20 mg/dL 28(H) 32(H) 28(H)  Creatinine 0.61 - 1.24 mg/dL 0.61 0.60(L) 0.68  Sodium 135 - 145 mmol/L 127(L) 127(L) 126(L)  Potassium 3.5 - 5.1 mmol/L 4.1 4.2 3.6  Chloride 98 - 111 mmol/L 95(L) 93(L) 92(L)  CO2 22 - 32 mmol/L '23 26 25  ' Calcium 8.9 - 10.3 mg/dL 9.6 9.7 9.7  Total Protein 6.5 - 8.1 g/dL - - -  Total Bilirubin 0.3 - 1.2 mg/dL - - -  Alkaline Phos 38 - 126 U/L - - -  AST 15 - 41 U/L - - -  ALT 0 - 44 U/L - - -     RADIOGRAPHIC STUDIES: I have personally reviewed the radiological images as listed and agreed with the findings in the report pronounced lytic lesions and cord compression in thoracic spine.   EEG  Result Date: 03/22/2020 Lora Havens, MD     03/22/2020  1:52 PM Patient Name: Jemery Stacey MRN: [id number] Epilepsy Attending: Lora Havens Referring Physician/Provider: Dr Karena Addison [place] Date: 03/22/2020 Duration: 23.25 mins Patient history: 53yo m with AMS. CTH showed right frontal mass concerning for mets. EEG to evaluate for seizure. Level of alertness: awake AEDs during EEG study: LEV Technical aspects: This EEG study was done with scalp electrodes positioned according to the 10-20 International system of electrode placement. Electrical activity was acquired at a sampling rate of '500Hz'  and reviewed with a high frequency filter of '70Hz'  and a low frequency filter of '1Hz' . EEG data were recorded continuously and digitally stored. Description: No posterior dominant rhythm was seen. EEG showed continuous generalized 3 to 6 Hz theta-delta slowing.  Hyperventilation and photic stimulation were not performed.   ABNORMALITY -Continuous slow, generalized IMPRESSION: This study is suggestive of moderate diffuse encephalopathy, nonspecific etiology. No seizures or epileptiform discharges were seen throughout the recording. Priyanka Barbra Sarks   CT ABDOMEN PELVIS WO CONTRAST  Result Date: 03/24/2020 CLINICAL DATA:  Abdominal pain, abdominal  distension EXAM: CT ABDOMEN AND PELVIS WITHOUT CONTRAST TECHNIQUE: Multidetector CT imaging of the abdomen and pelvis was performed following the standard protocol without IV contrast. Oral enteric contrast was administered. COMPARISON:  CT chest abdomen pelvis, 03/20/2020 FINDINGS: Lower chest: Dependent bibasilar atelectasis and/or consolidation with small bilateral pleural effusions, similar to prior examination. Hepatobiliary: No solid liver abnormality is seen. No gallstones, gallbladder wall thickening, or biliary dilatation. Pancreas: Unremarkable. No pancreatic ductal dilatation or surrounding inflammatory changes. Spleen: Normal in size without significant abnormality. Adrenals/Urinary Tract: Adrenal glands are unremarkable. Kidneys are normal, without renal calculi, solid lesion, or hydronephrosis. Bladder is unremarkable. Stomach/Bowel: Stomach is within normal limits. The small bowel and colon are diffusely gas distended, gas and stool present to the rectum, and degree of bowel distension somewhat increased compared to prior examination. Largest loops of mid small bowel measure up to 4.4 cm in caliber. The cecum measures up to 9.4 cm. Vascular/Lymphatic: No significant vascular findings are present. No enlarged abdominal or pelvic lymph nodes. Reproductive: No mass or other significant abnormality. Other: Small fat and fluid containing bilateral inguinal hernias. Trace ascites throughout the abdomen and pelvis, new compared to prior examination. Musculoskeletal: There is redemonstrated, diffuse lytic osseous  metastatic disease, most notable for a large lytic lesion and associated pathologic fracture of the T9 vertebral body. IMPRESSION: 1. The small bowel and colon are diffusely gas distended, gas and stool present to the rectum, and degree of bowel distension somewhat increased compared to prior examination. Findings are most consistent with ileus. 2. Trace ascites throughout the abdomen and pelvis, new compared to prior examination. 3. Dependent bibasilar atelectasis and/or consolidation of the included lung bases with small bilateral pleural effusions, similar to prior examination. 4. There is redemonstrated, diffuse lytic osseous metastatic disease, most notable for a large lytic lesion and associated pathologic fracture of the T9 vertebral body. Findings are in keeping with multiple myeloma. Electronically Signed   By: Eddie Candle M.D.   On: 03/24/2020 17:21   DG Chest 2 View  Result Date: 03/24/2020 CLINICAL DATA:  Shortness of breath and fever EXAM: CHEST - 2 VIEW COMPARISON:  February 25, 2020 FINDINGS: There is mild atelectatic change in the left mid lung and bibasilar regions. There is no edema or airspace opacity. Heart is upper normal in size with pulmonary vascularity normal. No adenopathy. No bone lesions. IMPRESSION: Areas of patchy atelectasis bilaterally. No edema or airspace opacity. Cardiac silhouette within normal limits. Electronically Signed   By: Lowella Grip III M.D.   On: 03/12/2020 15:21   DG Abd 1 View  Result Date: 03/13/2020 CLINICAL DATA:  Nausea, vomiting. EXAM: ABDOMEN - 1 VIEW COMPARISON:  March 10, 2020. FINDINGS: The bowel gas pattern is normal. Moderate amount of stool seen in right colon. No radio-opaque calculi or other significant radiographic abnormality are seen. IMPRESSION: Moderate stool burden is noted. No evidence of bowel obstruction or ileus. Electronically Signed   By: Marijo Conception M.D.   On: 03/13/2020 11:31   DG Abd 1 View  Result Date:  03/10/2020 CLINICAL DATA:  Abdominal pain. EXAM: ABDOMEN - 1 VIEW COMPARISON:  Radiograph 03/05/2020. FINDINGS: Mild gaseous distention of large and small bowel in a nonobstructive pattern. Small to moderate stool in the right colon, with diminished stool burden from prior exam. There is no evidence of free intra-abdominal air. No radiopaque calculi. No osseous abnormalities are seen. IMPRESSION: Bowel-gas pattern suggestive of ileus, with slight improvement in gaseous bowel distension over the past few  days. Electronically Signed   By: Keith Rake M.D.   On: 03/10/2020 14:13   CT HEAD WO CONTRAST  Result Date: 03/22/2020 CLINICAL DATA:  Headache EXAM: CT HEAD WITHOUT CONTRAST TECHNIQUE: Contiguous axial images were obtained from the base of the skull through the vertex without intravenous contrast. COMPARISON:  Brain MRI 03/20/2020 FINDINGS: Brain: Trace extra-axial hemorrhage beneath the right frontal lobe remains faintly visible. No new site of hemorrhage. No midline shift or other mass effect. Vascular: No hyperdense vessel or unexpected calcification. Skull: Numerous lucent lesions throughout the calvarium and skull base. The largest lesion is at the left clivus. Sinuses/Orbits: No acute finding. Other: None. IMPRESSION: 1. Trace extra-axial hemorrhage beneath the right frontal lobe remains faintly visible. No new site of hemorrhage. 2. Numerous lucent lesions throughout the calvarium and skull base, consistent with known multiple myeloma. Electronically Signed   By: Ulyses Jarred M.D.   On: 03/22/2020 00:12   CT HEAD WO CONTRAST  Result Date: 03/20/2020 CLINICAL DATA:  Stroke, follow-up. EXAM: CT HEAD WITHOUT CONTRAST TECHNIQUE: Contiguous axial images were obtained from the base of the skull through the vertex without intravenous contrast. COMPARISON:  Brain MRI 03/20/2020. CT head 03/20/2020 FINDINGS: Brain: The examination is mild to moderately motion degraded, limiting evaluation. A previously  demonstrated foci of intracranial hemorrhage along the anteroinferior right frontal lobe convexity (suspected both intraparenchymal and extra-axial) has decreased in conspicuity (series 5, image 16, image 17). No new site of acute intracranial hemorrhage is identified. There is no significant mass effect.  No midline shift. Vascular: No hyperdense vessel Skull: Redemonstrated are innumerable marrow replacing lesions throughout the calvarium, skull base and visualized upper cervical spine compatible with the known history of multiple myeloma Sinuses/Orbits: No acute orbital abnormality identified. No significant paranasal sinus disease or mastoid effusion. IMPRESSION: Motion degraded examination. Interval decrease in size and conspicuity of a known focus of intracranial hemorrhage along the anteroinferior right frontal lobe convexity (suspected both intraparenchymal and extra-axial). No new site of acute intracranial hemorrhage is identified. Redemonstrated innumerable marrow replacing lesions throughout the calvarium, skull base and visualized upper cervical spine compatible with the known history of multiple myeloma. Electronically Signed   By: Kellie Simmering DO   On: 03/20/2020 13:39   CT HEAD WO CONTRAST  Result Date: 03/20/2020 CLINICAL DATA:  Initial evaluation for acute altered mental status. EXAM: CT HEAD WITHOUT CONTRAST TECHNIQUE: Contiguous axial images were obtained from the base of the skull through the vertex without intravenous contrast. COMPARISON:  None available. FINDINGS: Brain: Cerebral volume within normal limits for age. There is a somewhat focal lobulated hyperdensity immediately subjacent to the right frontal calvarium (series 3, image 18) finding also seen on coronal sequence (series 5, images 21-23) and sagittal sequence (series 6, image 23). Lesion measures up to 2.1 cm in greatest dimension on sagittal sequence. While this area is often prone to artifact, finding is suspicious for a  small bleed. Finding is favored to be intra-axial in nature, although a small extra-axial component may be present as well. No significant mass effect or edema. No other acute intracranial hemorrhage. No acute large vessel territory infarct. No mass lesion, midline shift, or mass effect. No hydrocephalus. Vascular: No hyperdense vessel. Skull: Scalp soft tissues demonstrate no acute finding. Multiple scattered lucent lesion seen throughout the calvarium and visualized upper cervical spine. Note made of a prominent 1.7 cm lytic lesion within the clivus. Findings are nonspecific, but concerning for possible Mets or myeloma. Sinuses/Orbits: Globes and orbital  soft tissues within normal limits. Paranasal sinuses are clear. Trace left mastoid effusion noted. Other: None. IMPRESSION: 1. Small lobulated hyperdensity immediately subjacent to the right frontal calvarium as above. Highly suspicious for a small bleed. No significant mass effect or edema. 2. Multiple scattered lucent lesions throughout the calvarium and visualized upper cervical spine, consistent with history of multiple myeloma. 3. No other acute intracranial abnormality. Critical Value/emergent results were called by telephone at the time of interpretation on 03/20/2020 at 1:12 am to provider NA LI , who verbally acknowledged these results. Electronically Signed   By: Jeannine Boga M.D.   On: 03/20/2020 01:20   CT ANGIO CHEST PE W OR WO CONTRAST  Result Date: 03/27/2020 CLINICAL DATA:  Chest pain and difficulty breathing. Possible pulmonary embolism. EXAM: CT ANGIOGRAPHY CHEST WITH CONTRAST TECHNIQUE: Multidetector CT imaging of the chest was performed using the standard protocol during bolus administration of intravenous contrast. Multiplanar CT image reconstructions and MIPs were obtained to evaluate the vascular anatomy. CONTRAST:  107m OMNIPAQUE IOHEXOL 350 MG/ML SOLN COMPARISON:  Chest CT 03/20/2020 FINDINGS: Cardiovascular: Mild stable  cardiomegaly. Minimal calcified plaque over the left anterior descending and right coronary arteries. Thoracic aorta is normal in caliber. Pulmonary arterial system is well opacified without evidence of emboli. Mediastinum/Nodes: No evidence of mediastinal or hilar adenopathy. Interval development of left axillary adenopathy. Lungs/Pleura: Lungs are adequately inflated and demonstrate slight worsening small bilateral pleural effusions with associated posterior bibasilar atelectasis. Interval worsening of mild patchy bilateral airspace opacification most prominent over the periphery of the right upper lung. Findings may be due to multifocal infection. Airways are unremarkable. Interval enlargement of pleural based mass over the anterolateral left upper thorax measuring 2.3 x 5.6 cm (previously 1.4 x 5.1 cm). Upper Abdomen: No acute findings. Musculoskeletal: Interval progression of diffuse lytic process throughout the axial and appendicular bony structures compatible with known multiple myeloma. Multiple bilateral rib fractures unchanged. Fracture of the body of the sternum just below the sternomanubrial joint unchanged. Fracture with mild associated soft tissue density/lytic process over the inferior aspect of the right scapula with interval progression. Known pathologic compression fractures T6 and T9 without significant change. All these fracture are likely pathologic secondary to patient's underlying multiple myeloma. Review of the MIP images confirms the above findings. IMPRESSION: 1. No evidence of pulmonary embolism. 2. Interval worsening of mild patchy bilateral airspace process most prominent over the periphery of the right upper lung which may be due to multifocal infection. Slight worsening small bilateral pleural effusions with associated bibasilar atelectasis. 3. Interval enlargement of a pleural based mass over the anterolateral left upper thorax measuring 2.3 x 5.6 cm (previously 1.4 x 5.1 cm).  Interval development of left axillary adenopathy. Findings are likely due to patient's underlying multiple myeloma. 4. Stable cardiomegaly and minimal atherosclerotic coronary artery disease. 5. Diffuse lytic process throughout the axial and appendicular bony structures compatible with patient's known multiple myeloma. Multiple associated spinal compression fractures, rib fractures, sternal fracture and right inferior scapular fracture likely pathologic due to underlying myeloma. Electronically Signed   By: DMarin OlpM.D.   On: 03/27/2020 16:06   MR BRAIN W WO CONTRAST  Result Date: 03/20/2020 CLINICAL DATA:  Initial evaluation for brain mass or lesion. EXAM: MRI HEAD WITHOUT AND WITH CONTRAST TECHNIQUE: Multiplanar, multiecho pulse sequences of the brain and surrounding structures were obtained without and with intravenous contrast. CONTRAST:  681mGADAVIST GADOBUTROL 1 MMOL/ML IV SOLN COMPARISON:  Prior head CT from earlier the same day.  FINDINGS: Brain: Cerebral volume within normal limits for age. No significant cerebral white matter disease or other focal parenchymal signal abnormality. Abnormal T1/FLAIR hyperintensity involving the anterior inferior right frontal convexity, consistent with acute intracranial hemorrhage, corresponding with abnormality on prior CT (series 11, images 10-14). This appears to be both intraparenchymal and extra-axial, and is favored to have initially represented a small parenchymal bleed that developed subdural/extra-axial extension. Size of this hemorrhage measures approximately 1.8 cm in greatest dimension on sagittal sequences. No associated edema or significant regional mass effect. No definite underlying lesion. Slight asymmetric smooth dural thickening and enhancement seen elsewhere about the right cerebral convexity, favored to be reactive. Consideration given to possible myelomatous involvement of the dura, although this would be expected to be more nodular and  irregular in appearance. No other acute intracranial hemorrhage identified. Single punctate focus of susceptibility artifact noted at the right frontal centrum semi ovale, likely a small chronic microhemorrhage, of doubtful significance in isolation. No evidence for acute or subacute infarct. No encephalomalacia to suggest chronic cortical infarction. No appreciable intra-axial mass lesion. Ventricles normal size without hydrocephalus. No other extra-axial fluid collection. 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 Midline structures intact. No other abnormal enhancement. Vascular: Major intracranial vascular flow voids are maintained. Skull and upper cervical spine: Craniocervical junction within normal limits. Extensive and innumerable marrow replacing lesions seen throughout the calvarium, skull base, and visualized upper spine, compatible with history of multiple myeloma. Most prominent of these lesions positioned in the clivus and measures approximately 2 cm in size. No other scalp soft tissue abnormality. Sinuses/Orbits: Globes and orbital soft tissues within normal limits. Paranasal sinuses are largely clear. No significant mastoid effusion. Inner ear structures grossly normal. Other: None. IMPRESSION: 1. Acute intracranial hemorrhage involving the anterior-inferior right frontal convexity, stable from prior head CT. No associated edema or significant mass effect. 2. No other acute intracranial abnormality. 3. Innumerable marrow replacing lesions throughout the calvarium, skull base, and visualized upper spine, compatible with history of multiple myeloma. Electronically Signed   By: Jeannine Boga M.D.   On: 03/20/2020 07:35   CT CHEST ABDOMEN PELVIS W CONTRAST  Result Date: 03/20/2020 CLINICAL DATA:  Abdominal distension. EXAM: CT CHEST, ABDOMEN, AND PELVIS WITH CONTRAST TECHNIQUE: Multidetector CT imaging of the chest, abdomen and pelvis was performed following the standard protocol during bolus  administration of intravenous contrast. CONTRAST:  173m OMNIPAQUE IOHEXOL 300 MG/ML  SOLN COMPARISON:  February 14, 2020 FINDINGS: CT CHEST FINDINGS Cardiovascular: No significant vascular findings. There is mild cardiomegaly. No pericardial effusion. Mediastinum/Nodes: No enlarged mediastinal, hilar, or axillary lymph nodes. Thyroid gland, trachea, and esophagus demonstrate no significant findings. Lungs/Pleura: Mild to moderate severity areas of atelectasis and/or infiltrate are seen within the inferior aspect of the left upper lobe posterior aspect of the superior portion of the right lower lobe and posterior aspect of the bilateral lung bases. There is a very small left pleural effusion. A 5.2 cm x 1.5 cm pleural based soft tissue mass is seen along the anterior aspect of the left upper lobe (axial CT image 22, CT series number 3). A 1.4 cm pleural based soft tissue nodule is seen within this region on the prior study. Musculoskeletal: Chronic anterior second, third and fourth left rib fractures are noted. A chronic fracture of the right scapula is also seen. Numerous lytic lesions are seen throughout the thoracic spine with a chronic pathologic fractures noted at the levels of T6 and T9 vertebral bodies. These are seen on  the prior study. CT ABDOMEN PELVIS FINDINGS Hepatobiliary: A 9 mm cystic appearing area is seen within the medial aspect of the right lobe of the liver. No gallstones, gallbladder wall thickening, or biliary dilatation. Pancreas: Unremarkable. No pancreatic ductal dilatation or surrounding inflammatory changes. Spleen: Normal in size without focal abnormality. Adrenals/Urinary Tract: Adrenal glands are unremarkable. Kidneys are normal in size, without renal or hydronephrosis. A 1.3 cm simple cyst is seen within the anteromedial aspect of the upper left kidney. Bladder is unremarkable. Stomach/Bowel: There is a small hiatal hernia. Appendix appears normal. No evidence of bowel wall thickening,  distention, or inflammatory changes. A moderate amount of stool is seen within the distal sigmoid colon and rectum. Vascular/Lymphatic: No significant vascular findings are present. No enlarged abdominal or pelvic lymph nodes. Reproductive: The prostate gland is markedly enlarged. Other: A 4.0 cm x 3.2 cm fat-containing right scrotal hernia is noted. No abdominopelvic ascites. Musculoskeletal: Numerous lytic lesions are seen throughout the lumbar spine and pelvis. IMPRESSION: 1. Mild to moderate severity bilateral areas of atelectasis and/or infiltrate. 2. 5.2 cm x 1.5 cm pleural based soft tissue mass along the anterior aspect of the left upper lobe. A 1.4 cm pleural based soft tissue nodule is seen within this region on the prior study. These findings are concerning for the presence of a primary lung malignancy. Further evaluation with a nuclear medicine PET/CT is recommended. 3. Findings consistent with multiple myeloma versus diffuse osseous metastasis, with chronic pathologic fractures at the levels of T6 and T9 vertebral bodies. 4. Markedly enlarged prostate gland. 5. 4.0 cm x 3.2 cm fat-containing right scrotal hernia. 6. Mild cardiomegaly. 7. Small hiatal hernia. 8. Aortic atherosclerosis. 9. Small simple cyst within the left kidney. Aortic Atherosclerosis (ICD10-I70.0). Electronically Signed   By: Virgina Norfolk M.D.   On: 03/20/2020 01:09   CT BIOPSY  Result Date: 03/29/2020 INDICATION: 54 year old male with history of multiple myeloma and left anterior chest wall mass. EXAM: CT BIOPSY COMPARISON:  Chest CT from 03/27/2020 MEDICATIONS: None. ANESTHESIA/SEDATION: Fentanyl 25 mcg IV; Versed 0.5 mg IV Sedation time: 18 minutes; The patient was continuously monitored during the procedure by the interventional radiology nurse under my direct supervision. CONTRAST:  None. COMPLICATIONS: None immediate. PROCEDURE: Informed consent was obtained from the patient following an explanation of the procedure,  risks, benefits and alternatives. A time out was performed prior to the initiation of the procedure. The patient was positioned supine on the CT table and a limited CT was performed for procedural planning demonstrating similar appearing left anterior chest wall soft tissue mass invading the anterior second and third ribs measuring approximately 5.0 x 3.1 cm in axial dimension. The procedure was planned. The operative site was prepped and draped in the usual sterile fashion. Appropriate trajectory was confirmed with a 22 gauge spinal needle after the adjacent tissues were anesthetized with 1% Lidocaine with epinephrine. Under intermittent CT guidance, a 17 gauge coaxial needle was advanced into the peripheral aspect of the mass. Appropriate positioning was confirmed and a total of 5 samples were obtained with an 18 gauge core needle biopsy device. The co-axial needle was removed while injecting Gel-Foam slurry and hemostasis was achieved with manual compression. A limited postprocedural CT was negative for hemorrhage or additional complication. A dressing was placed. The patient tolerated the procedure well without immediate postprocedural complication. IMPRESSION: Technically successful CT guided core needle biopsy of left anterior chest wall mass. Electronically Signed   By: Ruthann Cancer MD   On: 03/29/2020  10:33   DG CHEST PORT 1 VIEW  Result Date: 03/30/2020 CLINICAL DATA:  Dyspnea EXAM: PORTABLE CHEST 1 VIEW COMPARISON:  03/28/2020 FINDINGS: Cardiac shadow is within normal limits. Lungs are well aerated bilaterally. Patchy airspace opacity is again identified throughout both lungs similar to that seen on the prior study. Stable lytic lesions are noted within the bony skeleton. IMPRESSION: Stable patchy airspace opacities bilaterally. Electronically Signed   By: Inez Catalina M.D.   On: 03/30/2020 21:38   DG CHEST PORT 1 VIEW  Result Date: 03/28/2020 CLINICAL DATA:  Tachypnea EXAM: PORTABLE CHEST 1 VIEW  COMPARISON:  03/27/2020 CT FINDINGS: Cardiac shadow is stable. Diffuse bilateral infiltrates are again seen similar to that noted on prior chest CT. No sizable effusion is noted. No pneumothorax is noted. Lytic lesions are noted throughout the bony skeleton particularly in the midthoracic spine similar to that seen on recent CT examination. IMPRESSION: Patchy bilateral infiltrate stable from the prior exam. Stable lytic lesions within the bony structures. Electronically Signed   By: Inez Catalina M.D.   On: 03/28/2020 23:04   DG Chest Port 1 View  Result Date: 03/23/2020 CLINICAL DATA:  Difficulty breathing EXAM: PORTABLE CHEST 1 VIEW COMPARISON:  03/23/2019 FINDINGS: Cardiac shadow is mildly prominent but stable. Patchy airspace opacity is again noted bilaterally but improved when compared with the prior exam. No sizable effusion or pneumothorax is noted. No bony abnormality is seen. IMPRESSION: Improving aeration bilaterally. Electronically Signed   By: Inez Catalina M.D.   On: 03/23/2020 07:37   DG CHEST PORT 1 VIEW  Result Date: 03/22/2020 CLINICAL DATA:  Rapid response EXAM: PORTABLE CHEST 1 VIEW COMPARISON:  03/21/2020, 03/28/2020, 02/25/2020 FINDINGS: Mildly diminished lung volumes. Mild cardiomegaly. Left greater than right airspace disease. No pneumothorax. IMPRESSION: Patchy left greater than right airspace disease concerning for pneumonia. Findings appear slightly worsened. Electronically Signed   By: Donavan Foil M.D.   On: 03/22/2020 00:47   DG CHEST PORT 1 VIEW  Result Date: 03/21/2020 CLINICAL DATA:  Shortness of breath EXAM: PORTABLE CHEST 1 VIEW COMPARISON:  March 19, 2020 chest radiograph and chest CT March 20, 2020 FINDINGS: There is persistent atelectatic change in left mid lung and left base regions. There is also atelectatic change in the right mid lung and right base regions, new. There may be superimposed pneumonia in the left mid lung. There is a small left pleural  effusion. Heart is upper normal in size with pulmonary vascularity normal. No adenopathy. Old healed rib trauma on the left, better seen by CT. IMPRESSION: Areas of atelectatic change bilaterally, overall increased bilaterally. Question superimposed pneumonia left mid lung. Small left pleural effusion. Stable cardiac silhouette. Electronically Signed   By: Lowella Grip III M.D.   On: 03/21/2020 08:09   DG ABD ACUTE 2+V W 1V CHEST  Result Date: 04/05/2020 CLINICAL DATA:  Abdominal pain EXAM: X-RAY ABDOMEN 3 VIEW COMPARISON:  Chest 03/30/2020.  Abdomen 03/30/2020 FINDINGS: Normal heart size. Patchy linear and airspace infiltrates in both mid lungs and in the right lower lung likely representing multifocal pneumonia. No significant progression since previous study. No pleural effusions. No pneumothorax. Mediastinal contours appear intact. Old and acute rib fractures. Multiple small focal lucencies within the visualized bones suggesting multiple myeloma. Moderately dilated gas-filled small and large bowel with large amount of stool in the rectosigmoid region. Changes likely represent adynamic ileus although obstipation may also be present. No abnormal air-fluid levels. No free air in the abdomen. No radiopaque stones. Degenerative  changes in the spine. IMPRESSION: 1. Patchy linear and airspace infiltrates in both mid lungs and in the right lower lung likely representing multifocal pneumonia. 2. Multiple rib fractures. Scattered bone lucencies suggest multiple myeloma. 3. Moderately dilated gas-filled small and large bowel with large amount of stool in the rectosigmoid region. Changes likely represent adynamic ileus although obstipation may also be present. Electronically Signed   By: Lucienne Capers M.D.   On: 04/05/2020 23:17   DG Abd Portable 1V  Result Date: 03/14/2020 CLINICAL DATA:  Abdominal distention, ileus EXAM: PORTABLE ABDOMEN - 1 VIEW COMPARISON:  03/29/2020 FINDINGS: Diffuse gaseous distention  of bowel, stable or worsening since prior study. No visible free air organomegaly. IMPRESSION: Stable or slight worsening diffuse gaseous distention of bowel compatible with ileus. Electronically Signed   By: Rolm Baptise M.D.   On: 04/02/2020 05:54   DG Abd Portable 1V  Result Date: 03/24/2020 CLINICAL DATA:  Fever EXAM: X-RAY ABDOMEN 1 VIEW COMPARISON:  03/13/2020 FINDINGS: The bowel gas pattern is nonspecific with gaseous distention of loops of small bowel and colon scattered throughout the abdomen. There is an above average amount of stool throughout the ascending colon. There is no definite pneumatosis or free air. No radiopaque kidney stones. IMPRESSION: 1. Nonspecific bowel gas pattern with gaseous distention of loops of small bowel and colon scattered throughout the abdomen. 2. Above average amount of stool throughout the ascending colon. This is not significantly changed from 03/13/2020. Electronically Signed   By: Constance Holster M.D.   On: 03/12/2020 17:46   EEG adult  Result Date: 03/24/2020 Lora Havens, MD     03/24/2020 11:15 AM Patient Name: Trevor Shelton MRN: [id number] Epilepsy Attending: Lora Havens Referring Physician/Provider: Dr Roland Rack Date: 03/24/2020 Duration: 22.49 mins Patient history: 54yo M with seizure like episode. EEG to evaluate for seizure. Level of alertness: Awake AEDs during EEG study: LEV Technical aspects: This EEG study was done with scalp electrodes positioned according to the 10-20 International system of electrode placement. Electrical activity was acquired at a sampling rate of '500Hz'  and reviewed with a high frequency filter of '70Hz'  and a low frequency filter of '1Hz' . EEG data were recorded continuously and digitally stored. Description: No posterior dominant rhythm was seen. EEG showed continuous generalized 3 to 6 Hz theta-delta slowing. Significant eye flutter artifact was also noted. Hyperventilation and photic stimulation were not performed.    ABNORMALITY -Continuous slow, generalized IMPRESSION: This study is suggestive of moderate diffuse encephalopathy, nonspecific etiology. No seizures or epileptiform discharges were seen throughout the recording. Lora Havens   ECHOCARDIOGRAM COMPLETE  Result Date: 03/21/2020    ECHOCARDIOGRAM REPORT   Patient Name:   SANDERS MANNINEN Date of Exam: 03/21/2020 Medical Rec #:  [id number]   Height:       63.0 in Accession #:    [id number]  Weight:       126.5 lb Date of Birth:  May 03, 1966    BSA:          1.592 m Patient Age:    13 years    BP:           133/99 mmHg Patient Gender: M           HR:           112 bpm. Exam Location:  Inpatient Procedure: 2D Echo, Color Doppler and Cardiac Doppler Indications:    Bacteremia  History:        Patient has no prior history  of Echocardiogram examinations.                 Risk Factors:Hypertension, Diabetes and Dyslipidemia.  Sonographer:    Raquel Sarna Senior RDCS Referring Phys: [id number]  Sonographer Comments: Poor apical window due to restricted mobility. IMPRESSIONS  1. Left ventricular ejection fraction, by estimation, is 70 to 75%. The left ventricle has hyperdynamic function. The left ventricle has no regional wall motion abnormalities. There is mild left ventricular hypertrophy. Left ventricular diastolic parameters are consistent with Grade I diastolic dysfunction (impaired relaxation).  2. Right ventricular systolic function is normal. The right ventricular size is normal.  3. Left atrial size was moderately dilated.  4. The mitral valve is abnormal. Trivial mitral valve regurgitation.  5. The aortic valve is tricuspid. Aortic valve regurgitation is not visualized.  6. The inferior vena cava is normal in size with greater than 50% respiratory variability, suggesting right atrial pressure of 3 mmHg. Conclusion(s)/Recommendation(s): No evidence of valvular vegetations on this transthoracic echocardiogram. Would recommend a transesophageal echocardiogram to exclude  infective endocarditis if clinically indicated. FINDINGS  Left Ventricle: Left ventricular ejection fraction, by estimation, is 70 to 75%. The left ventricle has hyperdynamic function. The left ventricle has no regional wall motion abnormalities. The left ventricular internal cavity size was normal in size. There is mild left ventricular hypertrophy. Left ventricular diastolic parameters are consistent with Grade I diastolic dysfunction (impaired relaxation). Indeterminate filling pressures. Right Ventricle: The right ventricular size is normal. No increase in right ventricular wall thickness. Right ventricular systolic function is normal. Left Atrium: Left atrial size was moderately dilated. Right Atrium: Right atrial size was normal in size. Pericardium: There is no evidence of pericardial effusion. Mitral Valve: The mitral valve is abnormal. There is mild thickening of the mitral valve leaflet(s). Trivial mitral valve regurgitation. Tricuspid Valve: The tricuspid valve is grossly normal. Tricuspid valve regurgitation is not demonstrated. Aortic Valve: The aortic valve is tricuspid. Aortic valve regurgitation is not visualized. Pulmonic Valve: The pulmonic valve was grossly normal. Pulmonic valve regurgitation is not visualized. Aorta: The aortic root and ascending aorta are structurally normal, with no evidence of dilitation. Venous: The inferior vena cava is normal in size with greater than 50% respiratory variability, suggesting right atrial pressure of 3 mmHg. IAS/Shunts: No atrial level shunt detected by color flow Doppler.  LEFT VENTRICLE PLAX 2D LVIDd:         4.30 cm LVIDs:         2.40 cm LV PW:         1.10 cm LV IVS:        0.90 cm LVOT diam:     2.00 cm LV SV:         78 LV SV Index:   49 LVOT Area:     3.14 cm  RIGHT VENTRICLE RV S prime:     19.80 cm/s TAPSE (M-mode): 2.4 cm LEFT ATRIUM             Index       RIGHT ATRIUM           Index LA diam:        3.70 cm 2.32 cm/m  RA Area:     19.10 cm LA  Vol (A2C):   46.0 ml 28.90 ml/m RA Volume:   53.30 ml  33.48 ml/m LA Vol (A4C):   82.5 ml 51.83 ml/m LA Biplane Vol: 63.7 ml 40.02 ml/m  AORTIC VALVE LVOT Vmax:   126.00 cm/s  LVOT Vmean:  96.400 cm/s LVOT VTI:    0.248 m  AORTA Ao Root diam: 3.20 cm Ao Asc diam:  2.80 cm  SHUNTS Systemic VTI:  0.25 m Systemic Diam: 2.00 cm Lyman Bishop MD Electronically signed by Lyman Bishop MD Signature Date/Time: 03/21/2020/12:51:13 PM    Final      ASSESSMENT & PLAN Onie Hayashi 54 y.o. male with medical history significant for HTN and HLD who presented with back pain and was found to have cord compression and lytic lesions concerning for multiple myeloma. The patient's imaging and blood work are most concerning for multiple myeloma.  Interestingly the M spike and the serum free light chain ratio is relatively modest compared to the extent of the damage seen on CT scan.  The patient had significant hypercalcemia upon presentation. Bone marrow biopsy was performed on 02/19/2020 and was consistent with a plasma cell neoplasm.  Cytogenetics were normal.  He received radiation for impending cord compression which was completed on 03/02/2020.   Recommended CyBorD chemotherapy.  Since the patient was admitted to the inpatient rehab unit, chemotherapy was initiated on 03/15/2020.  He was started on Velcade and dexamethasone only with plan to add Cytoxan as an outpatient. This was held due to numerous complications including brain bleed, MSSA bacteremia, and lung mass.  Unfortunately under the current circumstances I do not see a way forward for treating this patient's cancer. He is badly deconditioned and suffered a severe series of sequela following his last attempt at treatment. With his KPS 40 (ECOG 3) I do not think it would be safe to proceed with further treatment and frankly it may shorten his life rather than extend it. His overall prognosis is quite poor even if treatment could be initiated and continued. As such I do  think it is appropriate to proceed with comfort based/ hospice care. I was clear with the patient that this is my recommendation and that further multiple myeloma treatment should not be offered.   SPEP 02/15/2020: M protein 0.9 SFLC: kappa 37.5, Lambda 9.9, ratio 3.79 UPEP: 02/17/2020: M spike not observed.  Immunofixation showed IgG monoclonal protein with kappa light chain specificity  # Lytic Lesions Througout Skeleton/Hypercalcemia # Pathologic Fractures of Spine/Soft Tissue Masses in Spine # Multiple Myeloma --bone marrow biopsy performed 02/19/2020 to confirm diagnosis; results consistent with plasma cell neoplasm.  Normal cytogenetics. --M protein and Kappa ratio are modestly elevated compared the the extent of damage seen on CT/MRI. 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   --The patient was started on CyBorD chemotherapy.  Velcade and dexamethasone were given on 9/7.  We had plan to administer his second dose of Velcade on 9/14, but given his change in status and MSSA bacteremia, chemotherapy was held Updates:  --OK to stop acyclovir 400 mg daily. --unable to proceed with chemotherapy treatment due to mark deconditioning, weakness, and worsening clinical status. This was discussed with the patient --agree with continued [place] discussions with palliative care. Overall prognosis is poor. Patient is hospice/comfort care appropriate.    #MSSA bacteremia --From possible pneumonia. --completed antibiotics per infectious disease. --currently on Ancef until 04/04/2020.   #Thrombocytopenia, declining --Likely due to underlying multiple myeloma and recent Velcade treatment although Velcade is not known to cause significant pancytopenia.  Could also be due to acute infection/bacteremia/antibiotics. --Continue supportive therapy with platelet transfusion for  platelet count less than 20 or  if he has severe, active clinical bleeding. --HIT panel not indicative of HIT.  Likely due to bacteremia/acute infection.  Counts are worsening  #Anemia, stable --Hemoglobin stable at 8.0 today --Anemia likely due to underlying malignancy and acute illness --Monitor hemoglobin and transfuse PRBCs for hemoglobin less than 7.  #Pleural-based soft tissue mass noted on recent CT --biopsy of pleural-based soft tissue mass shows mixed inflammation, debris and hemorrhage --no clear sign of second primary malignancy based on scan/pathology.    Ledell Peoples, MD Department of Hematology/Oncology [place] [facility] Phone: [phone] Pager: [phone] Email: [email or web address]   04/06/2020 8:34 AM

## 2020-04-06 ENCOUNTER — Encounter (HOSPITAL_COMMUNITY): Payer: Self-pay

## 2020-04-06 DIAGNOSIS — R0609 Other forms of dyspnea: Secondary | ICD-10-CM

## 2020-04-06 LAB — GLUCOSE, CAPILLARY
Glucose-Capillary: 164 mg/dL — ABNORMAL HIGH (ref 70–99)
Glucose-Capillary: 138 mg/dL — ABNORMAL HIGH (ref 70–99)

## 2020-04-06 LAB — CBC WITH DIFFERENTIAL/PLATELET
Abs Immature Granulocytes: 0.79 10*3/uL — ABNORMAL HIGH (ref 0.00–0.07)
Basophils Absolute: 0 10*3/uL (ref 0.0–0.1)
Basophils Relative: 0 %
Eosinophils Absolute: 0 10*3/uL (ref 0.0–0.5)
Eosinophils Relative: 0 %
HCT: 26.3 % — ABNORMAL LOW (ref 39.0–52.0)
Hemoglobin: 8.8 g/dL — ABNORMAL LOW (ref 13.0–17.0)
Immature Granulocytes: 11 %
Lymphocytes Relative: 8 %
Lymphs Abs: 0.6 10*3/uL — ABNORMAL LOW (ref 0.7–4.0)
MCH: 29.7 pg (ref 26.0–34.0)
MCHC: 33.5 g/dL (ref 30.0–36.0)
MCV: 88.9 fL (ref 80.0–100.0)
Monocytes Absolute: 0.4 10*3/uL (ref 0.1–1.0)
Monocytes Relative: 5 %
Neutro Abs: 5.3 10*3/uL (ref 1.7–7.7)
Neutrophils Relative %: 76 %
Platelets: 33 10*3/uL — ABNORMAL LOW (ref 150–400)
RBC: 2.96 MIL/uL — ABNORMAL LOW (ref 4.22–5.81)
RDW: 13.8 % (ref 11.5–15.5)
WBC: 7.1 10*3/uL (ref 4.0–10.5)
nRBC: 1.8 % — ABNORMAL HIGH (ref 0.0–0.2)

## 2020-04-06 LAB — MAGNESIUM: Magnesium: 1.7 mg/dL (ref 1.7–2.4)

## 2020-04-06 MED ORDER — LORAZEPAM 2 MG/ML IJ SOLN
2.0000 mg | INTRAMUSCULAR | Status: DC | PRN
  Administered 2020-04-06 – 2020-04-08 (×7): 2 mg via INTRAVENOUS
  Filled 2020-04-06 (×9): qty 1

## 2020-04-06 MED ORDER — MORPHINE SULFATE (PF) 2 MG/ML IV SOLN
2.0000 mg | INTRAVENOUS | Status: DC | PRN
  Administered 2020-04-06 – 2020-04-09 (×21): 2 mg via INTRAVENOUS
  Filled 2020-04-06 (×22): qty 1

## 2020-04-06 MED ORDER — DOCUSATE SODIUM 100 MG PO CAPS
200.0000 mg | ORAL_CAPSULE | Freq: Two times a day (BID) | ORAL | Status: DC
  Filled 2020-04-06: qty 2

## 2020-04-06 MED ORDER — ACETAMINOPHEN 650 MG RE SUPP
650.0000 mg | RECTAL | Status: DC | PRN
  Administered 2020-04-07 – 2020-04-09 (×3): 650 mg via RECTAL
  Filled 2020-04-06 (×3): qty 1

## 2020-04-06 MED ORDER — METOPROLOL TARTRATE 5 MG/5ML IV SOLN
5.0000 mg | Freq: Once | INTRAVENOUS | Status: AC
  Administered 2020-04-06: 5 mg via INTRAVENOUS
  Filled 2020-04-06: qty 5

## 2020-04-06 NOTE — Progress Notes (Signed)
Patients sister asked for letters for patients family in [place] to assist in their ability to travel here to see patient, Given names and requested the above information from attending. Spoke to transition of care as requested by physician and they are not able to write these letters. Attempted to contact physician and oncologist for letters without success. Patients family notified during afternoon of steps taken to attempt to get letters.  Reported to [facility]. She will attempt to get letters tonight if unsuccessful will speak to physicians during morning rounding.

## 2020-04-06 NOTE — Progress Notes (Signed)
Patient ID: Trevor Shelton, male   DOB: 06/06/1966, 54 y.o.   MRN: [id number]  PROGRESS NOTE    Trevor Shelton  BHA:[id number] DOB: 02/17/1966 DOA: 04/03/2020 PCP: Patient, No Pcp Per    Subjective:  The patient was seen and examined this morning, confused but awake, does not speak much [place].  Patient's son was called... On speaker phone as daughter and wife present at bedside.    The patient was found confused, mild lethargic, mild labored breathing with respiratory rate of 19, tachycardic heart rate of 116.  Was able to follow some commands but according to the family he is confused. No major issues overnight  --------------------------------------------------------------------------------------------------------------------------------------  Brief Narrative:  54 year old male with very long/complex hospitalization at [facility] recently in 02/2020 with back pain and was found to have diffuse lytic lesions throughout the skeleton, epidural tumor extension with pathological fracture of T6 and T7, contrast-enhancing mass replacing most of T5 vertebral body with severe cord compression, and acute renal failure, required couple of hemodialysis sessions, kidney function improved. --Followed by medical and radiation oncology, treated with IV Decadron and has completed 10/10 doses of XRT. Work-up consistent with newly diagnosed IgG kappa MM with plans for CyBorD chemo to start following completion of XRT.  -Following transfer to rehab, he underwent chemotherapy on 03/15/2020 and next chemotherapy was scheduled for 03/22/2020.Patient had significant nosebleed on 03/17/2020. -Subsequently spiked fevers with T-max 102 -blood culture grew MSSA bacteremia, started on IV Ancef seen by ID as well for this -During this hospitalization; he was also noted to have a 5 x 2 cm left upper lobe chest wall mass on CT. -On 03/21/2020: He was found to be lethargic/less responsive.  Seen by neurology: Suspicious for  seizure activity.  Started on Keppra.  EEG negative.  MRI noted to have acute intracranial hemorrhage in the right frontal convexity, followed by neurology for this.  Mental status slowly improving. -Patient has been complaining of chest pain throughout at the site of his chest wall mass.  CTA negative for PE [facility] course complicated by ileus and pleural effusion from third spacing, now improving -03/28/2020: Received platelet transfusion.  03/29/2020: CT-guided biopsy of left anterior chest wall mass  Assessment & Plan:   Acute intracranial hemorrhage Patient is confused, no extensive changes per family MRI at bedside No new focal neurological findings  -On 9/12 patient was noted to be disoriented, CT head noted right frontal lesion concerning for hemorrhage , MRI noted acute hemorrhage involving anterior-inferior right frontal convexity, this is felt to be secondary to severe thrombocytopenia from effect of multiple myeloma and chemotherapy.  -Repeat CT head decreased size of hemorrhage -Appreciate neurology input, recommended baby aspirin when platelet count is higher and cleared by oncology -transfuse plst if <20K or actively bleeding as per oncology --Monitoring mental status  Severe sepsis: Present on admission -MSSA bacteremia -Hemodynamically stable with exception of tachycardia heart rate of 116, blood pressure 146/92 this morning, respiratory 19, temp 99.4   -Currently sepsis has resolved.  Repeat blood cultures have been negative.  2D echo was unremarkable for vegetations.  TEE deferred especially in the setting of severe thrombocytopenia and complicated hospitalization -was  IV Ancef.  ID recommended to continue Ancef through 04/04/2020.  DC Ancef. -Currently on acyclovir, Decadron,  Metastatic multiple myeloma Epidural tumor extension at T6 and T9 with compressive myelopathy Hypercalcemia ... Monitoring --Originally admitted with this issue  -treated with radiation to  thoracic spine due to concern for impending cord compression - MRI  02/14/20: Epidural tumor extension at T6 and T9 and T6 lesion severely narrowed thecal sac and deformed the spinal cord .  -Rx w/ velcade and dexamethasone on 9/7, acyclovir, diflucan. -Oncology following, further chemotherapy on hold with acute infection -Remains on Decadron every other day and prophylactic acyclovir -oncology reevaluation appreciated: Considering restarting treatment this week if his overall clinical status and counts stabilize.  Awaiting further recommendations. -Palliative care following.  Pleural-based soft tissue mass noted on recent CT -Pathology of biopsy showed mixed inflammation, debris and hemorrhage with no clear signs of malignancy.  Pancytopenia/anemia/severe thrombocytopenia -Status post multiple units of packed red cells transfusion and platelet transfusion during this hospitalization -Monitor.  Transfuse PRBC if hemoglobin less than 7 and platelets less than 10K or active bleeding - Status post 1 more unit of packed red cell transfusion on 04/04/2020.   -Hemoglobin 8.2, 8.0, 8.8 today -Platelets 52, 46, 33 today  Hyponatremia -Likely due to poor p.o intake, malnutrition, diuretics -Monitoring sodium level  Lasix discontinued on 04/02/2020.  Salt tablets started on 04/02/2020.  Continue  Hypokalemia -Improved.  Hypomagnesemia -Improved.  Acute encephalopathy multifactorial -Patient remains confused, lethargic  -Became lethargic and more confused on 03/20/2020.  There was a question of seizure.  He was transferred to neuro ICU on 03/22/2020 and started on Keppra.   EEG showed moderate encephalopathy with no epileptiform discharges. -  Neurology has signed off.  Continue Keppra.  Epistaxis -Resolved -As needed treatment with Afrin   Chest pain/diffuse body pain -Secondary to diffuse bony involvement and chest wall mass.  EKG unremarkable.  Troponins negative.  CTA negative for PE.   Continue narcotics as needed.   -Appreciated palliative following, helping with pain management   Pleural effusions Third spacing: -Lasix discontinued on 04/02/2020. -Monitor  AKI/Short term HD in august 2021:  -Renal function improved and stable  T2DM -newly diagnosed in august 2021. a1c 6.5.  -Monitoring CBG QA CHS, SSI NovoLog coverage -Continue Lantus   Chronic constipation Ileus -Continue laxatives  HTN/HLD -Continue to hold bp meds amlodipine on hold, on coreg, was on  Midodrine -BP more stable now -DC'd midodrine on 04/04/2020.  Severe hypoalbuminemia -Could be secondary to underlying malignancy and complicated hospitalization -Follow nutrition recommendations -Monitoring  Weakness/debility/physical deconditioning:  Lower extremity weakness from spinal cord involvement -Education officer, museum following, prognosis remain poor, likely SNF if survives this hospitalization   Ethics: Palliative care following, patient and family has agreed to DNR/DNI status, due to ongoing progressive medical issues as listed above, prognosis remains very poor. Patient continued decline could not tolerate any further chemotherapy We strongly recommending hospice/comfort care measures    DVT prophylaxis: SCDs Code Status: DNR Family Communication: Sister present at bedside on 04/02/2020  disposition Plan: Status is: Inpatient  Remains inpatient appropriate because:Inpatient level of care appropriate due to severity of illness   Dispo: The patient is from: Home              Anticipated d/c is to: SNF              Anticipated d/c date is: > 3 days              Patient currently is not medically stable to d/c.  Consultants: Neurology/oncology/IR  Procedures: Chest wall mass biopsy on 03/29/2020  Antimicrobials:  Anti-infectives (From admission, onward)   Start     Dose/Rate Route Frequency Ordered Stop   04/05/20 0100  ceFAZolin (ANCEF) IVPB 2g/100 mL premix  Status:  Discontinued  2 g 200 mL/hr over 30 Minutes Intravenous Every 8 hours 04/04/20 1802 04/05/20 0740   03/20/20 1600  ceFAZolin (ANCEF) IVPB 2g/100 mL premix  Status:  Discontinued        2 g 200 mL/hr over 30 Minutes Intravenous Every 8 hours 03/20/20 1505 04/04/20 1802   03/20/20 1000  acyclovir (ZOVIRAX) 200 MG capsule 400 mg        400 mg Oral Daily 03/29/2020 2217     03/20/20 1000  fluconazole (DIFLUCAN) tablet 100 mg        100 mg Oral Daily 03/13/2020 2217 03/23/20 0959   03/20/20 0800  vancomycin (VANCOREADY) IVPB 750 mg/150 mL  Status:  Discontinued        750 mg 150 mL/hr over 60 Minutes Intravenous Every 12 hours 03/18/2020 2217 03/20/20 1452   03/20/20 0800  ceFEPIme (MAXIPIME) 2 g in sodium chloride 0.9 % 100 mL IVPB  Status:  Discontinued        2 g 200 mL/hr over 30 Minutes Intravenous Every 8 hours 03/20/20 0001 03/20/20 1452   03/20/20 0015  ceFEPIme (MAXIPIME) 2 g in sodium chloride 0.9 % 100 mL IVPB        2 g 200 mL/hr over 30 Minutes Intravenous  Once 03/20/20 0001 03/20/20 0159   03/27/2020 2230  vancomycin (VANCOREADY) IVPB 1250 mg/250 mL  Status:  Discontinued        1,250 mg 166.7 mL/hr over 90 Minutes Intravenous  Once 03/26/2020 2217 03/22/2020 2225   03/14/2020 2224  piperacillin-tazobactam (ZOSYN) IVPB 3.375 g  Status:  Discontinued        3.375 g 12.5 mL/hr over 240 Minutes Intravenous Every 8 hours 03/09/2020 2217 03/20/20 0000        Objective:  Vitals:   04/05/20 1604 04/05/20 1939 04/06/20 0633 04/06/20 0840  BP:  134/86 129/80 (!) 146/92  Pulse: 86 85 (!) 103 (!) 116  Resp: 20 (!) _0 Temp: 98.4 F (36.9 C) 98 F (36.7 C) 98.7 F (37.1 C) 99.4 F (37.4 C)  TempSrc: Oral Oral Oral Axillary  SpO2:  93% 94% 100%  Weight:   60.4 kg   Height:        Intake/Output Summary (Last 24 hours) at 04/06/2020 1100 Last data filed at 04/05/2020 2000 Gross per 24 hour  Intake 700 ml  Output 900 ml  Net -200 ml   Filed Weights   04/04/20 0435 04/05/20 0400 04/06/20  5830  Weight: 62.1 kg 60.1 kg 60.4 kg       Physical Exam:   General:   Awake confused accompanied by daughter and wife  HEENT:  Normocephalic, PERRL, otherwise with in Normal limits   Neuro:  CNII-XII intact. , normal motor and sensation, reflexes intact   Lungs:   Clear to auscultation BL, mild labored breathing, no wheezes / crackles  Cardio:     Tachycardic S1/S2, RRR, No murmure, No Rubs or Gallops   Abdomen:   Soft, non-tender, bowel sounds active all four quadrants,  no guarding or peritoneal signs.  Muscular skeletal:   Severe generalized weakness, Limited exam - in bed, able to move all 4 extremities, Normal strength,  2+ pulses,  symmetric, No pitting edema  Skin:  Dry, warm to touch, negative for any Rashes, No open wounds  Wounds: Please see nursing documentation Pressure Injury 03/16/20 Coccyx Mid;Right Stage 2 -  Partial thickness loss of dermis presenting as a shallow open injury with a red, pink wound  bed without slough. admitted this admission 03/15/2020 (Active)  03/16/20 1324  Location: Coccyx  Location Orientation: Mid;Right  Staging: Stage 2 -  Partial thickness loss of dermis presenting as a shallow open injury with a red, pink wound bed without slough.  Wound Description (Comments): admitted this admission 03/20/2020  Present on Admission: Yes     Pressure Injury 03/16/20 Buttocks Left;Medial Stage 2 -  Partial thickness loss of dermis presenting as a shallow open injury with a red, pink wound bed without slough. POA 03/29/2020 (Active)  03/16/20 2200  Location: Buttocks  Location Orientation: Left;Medial  Staging: Stage 2 -  Partial thickness loss of dermis presenting as a shallow open injury with a red, pink wound bed without slough.  Wound Description (Comments): POA 03/10/2020  Present on Admission: Yes            data Reviewed: I have personally reviewed following labs and imaging studies  CBC: Recent Labs  Lab 04/02/20 0723 04/02/20 0723 04/03/20 0341  04/04/20 0233 04/04/20 2130 04/05/20 0230 04/06/20 0547  WBC 4.5   < > 4.1 4.5 6.3 5.6 7.1  NEUTROABS 3.5  --  3.1 3.4  --  4.1 5.3  HGB 7.6*   < > 7.0* 6.9* 8.2* 8.0* 8.8*  HCT 21.7*   < > 20.5* 19.9* 24.5* 23.7* 26.3*  MCV 86.5   < > 86.5 87.3 87.8 87.8 88.9  PLT 95*   < > 74* 59* 52* 46* 33*   < > = values in this interval not displayed.   Basic Metabolic Panel: Recent Labs  Lab 04/01/20 0717 04/01/20 0717 04/02/20 0723 04/03/20 0341 04/04/20 0233 04/05/20 0230 04/06/20 0547  NA 125*  --  124* 126* 127* 127*  --   K 3.3*  --  2.8* 3.6 4.2 4.1  --   CL 89*  --  84* 92* 93* 95*  --   CO2 26  --  _0 --   GLUCOSE 181*  --  121* 146* 224* 202*  --   BUN 28*  --  30* 28* 32* 28*  --   CREATININE 0.73  --  0.64 0.68 0.60* 0.61  --   CALCIUM 9.3  --  9.5 9.7 9.7 9.6  --   MG 1.6*   < > 1.6* 1.5* 1.9 1.7 1.7   < > = values in this interval not displayed.   GFR: Estimated Creatinine Clearance: 83 mL/min (by C-G formula based on SCr of 0.61 mg/dL). Liver Function Tests: No results for input(s): AST, ALT, ALKPHOS, BILITOT, PROT, ALBUMIN in the last 168 hours. No results for input(s): LIPASE, AMYLASE in the last 168 hours. No results for input(s): AMMONIA in the last 168 hours. Coagulation Profile: No results for input(s): INR, PROTIME in the last 168 hours. Cardiac Enzymes: No results for input(s): CKTOTAL, CKMB, CKMBINDEX, TROPONINI in the last 168 hours. BNP (last 3 results) No results for input(s): PROBNP in the last 8760 hours. HbA1C: No results for input(s): HGBA1C in the last 72 hours. CBG: Recent Labs  Lab 04/05/20 0705 04/05/20 1158 04/05/20 1628 04/05/20 2145 04/06/20 0802  GLUCAP 164* 159* 170* 165* 164*   Lipid Profile: No results for input(s): CHOL, HDL, LDLCALC, TRIG, CHOLHDL, LDLDIRECT in the last 72 hours. Thyroid Function Tests: No results for input(s): TSH, T4TOTAL, FREET4, T3FREE, THYROIDAB in the last 72 hours. Anemia Panel: No  results for input(s): VITAMINB12, FOLATE, FERRITIN, TIBC, IRON, RETICCTPCT in the last 72 hours. Sepsis  Labs: No results for input(s): PROCALCITON, LATICACIDVEN in the last 168 hours.  No results found for this or any previous visit (from the past 240 hour(s)).       Radiology Studies: DG ABD ACUTE 2+V W 1V CHEST  Result Date: 04/05/2020 CLINICAL DATA:  Abdominal pain EXAM: X-RAY ABDOMEN 3 VIEW COMPARISON:  Chest 03/30/2020.  Abdomen 03/27/2020 FINDINGS: Normal heart size. Patchy linear and airspace infiltrates in both mid lungs and in the right lower lung likely representing multifocal pneumonia. No significant progression since previous study. No pleural effusions. No pneumothorax. Mediastinal contours appear intact. Old and acute rib fractures. Multiple small focal lucencies within the visualized bones suggesting multiple myeloma. Moderately dilated gas-filled small and large bowel with large amount of stool in the rectosigmoid region. Changes likely represent adynamic ileus although obstipation may also be present. No abnormal air-fluid levels. No free air in the abdomen. No radiopaque stones. Degenerative changes in the spine. IMPRESSION: 1. Patchy linear and airspace infiltrates in both mid lungs and in the right lower lung likely representing multifocal pneumonia. 2. Multiple rib fractures. Scattered bone lucencies suggest multiple myeloma. 3. Moderately dilated gas-filled small and large bowel with large amount of stool in the rectosigmoid region. Changes likely represent adynamic ileus although obstipation may also be present. Electronically Signed   By: Lucienne Capers M.D.   On: 04/05/2020 23:17        Scheduled Meds: . (feeding supplement) PROSource Plus  30 mL Oral BID BM  . sodium chloride   Intravenous Once  . acetaminophen  1,000 mg Oral TID  . acyclovir  400 mg Oral Daily  . calcium carbonate  400 mg of elemental calcium Oral BID WC  . carvedilol  3.125 mg Oral BID WC  .  dexamethasone  4 mg Oral Q12H  . docusate sodium  200 mg Oral BID  . gabapentin  300 mg Oral QHS  . insulin aspart  0-9 Units Subcutaneous TID WC  . insulin aspart  7 Units Subcutaneous TID WC  . insulin glargine  20 Units Subcutaneous QHS  . lactulose  20 g Oral TID  . levETIRAcetam  500 mg Oral BID  . lidocaine  2 patch Transdermal Q24H  . melatonin  3 mg Oral QHS  . metoprolol tartrate  5 mg Intravenous Once  . morphine  30 mg Oral Q12H  . pantoprazole  40 mg Oral BID AC  . Ensure Max Protein  11 oz Oral BID BM  . QUEtiapine  12.5 mg Oral QHS  . senna-docusate  2 tablet Oral BID  . sodium chloride  2 spray Each Nare Q1H while awake  . sodium chloride  1 g Oral TID WC  . sucralfate  1 g Oral TID WC & HS   Continuous Infusions: . sodium chloride 10 mL/hr at 04/04/20 0430          Deatra James, MD [facility] 04/06/2020, 11:00 AM

## 2020-04-06 NOTE — Progress Notes (Signed)
Brief Oncology Consult Note  Visited with Trevor Shelton and his family today regarding goals of care. I spoke with Trevor Shelton last night noting that I do not think we could proceed with chemotherapy given his markedly weakened status and continued decline. He voiced his understanding at that time and wished for [place] to return to discuss this with his family.  On my return today Trevor Shelton is surrounded by family and friends. His wife is at bedside as well as his children and his siblings. I was accompanied by the hospital approved interpreter and the palliative care nurse practitioner. I expressed to Trevor Shelton family that his clinical status is continuing to decline and has recently worsened considerably. I was clear that I do not think he is a candidate for further chemotherapy treatments and that without treatment he will pass away. I recommended hospice care/comfort based care as the next best step. The family had the opportunity to ask questions regarding his prognosis and expectations moving forward. This is a truly sad case, however given his rapid decline and marked difficulty with last attempted chemotherapy I think that comfort based care is most appropriate. The patient is family voiced their understanding and acceptance. Arrangements for hospice/comfort care to be deferred to the primary team.  Our service can be available for any further discussions, concerns, or issues that arise. Please not hesitate to call or page if there is any way we can be of assistance in the care of Trevor Shelton.  Trevor Peoples, MD Department of Hematology/Oncology [place] [facility] Phone: [phone] Pager: [phone] Email: [email or web address]  04/06/20 1:14 PM

## 2020-04-06 NOTE — Progress Notes (Signed)
Palliative care/ethics:  Family discussion through live present interpreter at the bedside  Extensive discussion family meeting at the bedside with patient and extended family present along with palliative care team... Current findings, patient's progressive decline was discussed in detail. Was explained to the patient and family that despite all effort is declining, his current findings is to lead to his demise.  We anticipate very poor prognosis.  Patient's wife confirmed DNR/DNI status Family is agreed to proceed with comfort care measures Family still looking forward to speak further with Dr Lorenso Courier, this afternoon at 3 PM.   Dr. Roger Shelter

## 2020-04-06 NOTE — Progress Notes (Signed)
Patient ID: Trevor Shelton, male   DOB: September 02, 1965, 54 y.o.   MRN: [id number]  This NP visited patient at the bedside as a follow up for palliaitve medcine needs and emotional support and to meet as schduled with wife, Spanish interpretor included.  Today many family are gathered along with the priest.   He received sacrament of the sick.   HPI:54 y.o.maleadmitted on 9/11/2021with PMH significant formultiple myelomainitially diagnosed after a fall. ACT-guided bone marrow biopsy on 8/13/21showedpositive for neoplasm kappa light chain consistent with multiple myeloma. Imaging showed multiple spinal lytic lesions,left rib fracture on ninth and 10th, cord compression.He finished 10 sessions of radiation therapy.He was discharged to [facility] 8/26 where he received his first dose of chemotherapy.On 9/11 he was re-admitted to [facility] with fevers and encephalopathy. CT showed small ICH. Blood cultures grew MSSA. He had been improving and ICH had been resolving on imaging. In the late PM hours of 9/14 he became lethargic and unresponsive. CT head showed improvement of bleed.Patient has has continued physical and functional decline.  Difficult pain control  Per oncology note from yesterday/Dr Lorenso Courier  (Unfortunately under the current circumstances oncolgy I do not see a way forward for treating this patient's cancer. He is badly deconditioned and suffered a severe series of sequela following his last attempt at treatment. With his KPS 40 (ECOG 3) I do not think it would be safe to proceed with further treatment and frankly it may shorten his life rather than extend it. His overall prognosis is quite poor even if treatment could be initiated and continued. As such I do think it is appropriate to proceed with comfort based/ hospice care. I was clear with the patient that this is my recommendation and that further multiple myeloma treatment should not be offered. )  Patient has had rapid decline over the past 24  hours.  Patient and his family face treatment option decisions, advanced directive decisions and  anticipatory care needs.   Large family gathered in room. Continued conversation regarding diagnosis, prognosis, GOCs, EOL wishes disposition and options.  Understandably this is a very hard for this patient and his family.  Coordinated meetings for family to speak with Dr Barrett Henle and Dr Lorenso Courier.  Family was updated by physicians on current condition.   All understand the limited prognosis.   I offered education  regarding the difference between and an aggressive medical intervention path and a palliative comfort path.      Education offered regarding the natural trajectory and expectations at EOL    Prognosis is likely hours to days.   Plan of Care: -Focus of care is comfort and dignity-- allowing for natural death -DNR/DNI - no further diagnostics, no cardiac monitoring, lab draws -minimize medications to only those enhancing comfort and reducing suffering      - Pain/dyspnea- Morphine 2 mg every hr prn      - Ativan 2 mg every 4 hrs prn   Emotional support offered  Questions and concerns addressed     Discussed with Dr Jamesetta Geralds and Dr Lorenso Courier   Total time spent on the unit was 75  minutes    PMT will continue to support holistically   Greater than 50% of the time was spent in counseling and coordination of care  Wadie Lessen NP  Palliative Medicine Team Team Phone # [phone] Pager [phone]

## 2020-04-07 ENCOUNTER — Inpatient Hospital Stay: Payer: Self-pay | Admitting: Physician Assistant

## 2020-04-07 MED ORDER — MORPHINE SULFATE (PF) 2 MG/ML IV SOLN
2.0000 mg | Freq: Four times a day (QID) | INTRAVENOUS | Status: DC
  Administered 2020-04-07 – 2020-04-08 (×6): 2 mg via INTRAVENOUS
  Filled 2020-04-07 (×5): qty 1

## 2020-04-07 NOTE — Progress Notes (Signed)
Patient ID: Trevor Shelton, male   DOB: [birth year], 54 y.o.   MRN: [id number]  PROGRESS NOTE    Trevor Shelton  KNL:[id number] DOB: [birth year] DOA: 03/29/2020 PCP: Patient, No Pcp Per    Subjective:  Patient was seen and examined this morning, remained confused lethargic...   Extensive family meeting this morning and yesterday along with palliative care team.  Patient also had continued discussion with Dr. Lorenso Courier  Currently care team along with the family is on board with recommendation of comfort care measures.  Family requesting patient to be cared for till extended family to visit from [place].  --------------------------------------------------------------------------------------------------------------------------------------  Brief Narrative:  54 year old male with very long/complex hospitalization at [facility] long [facility] recently in 02/2020 with back pain and was found to have diffuse lytic lesions throughout the skeleton, epidural tumor extension with pathological fracture of T6 and T7, contrast-enhancing mass replacing most of T5 vertebral body with severe cord compression, and acute renal failure, required couple of hemodialysis sessions, kidney function improved. --Followed by medical and radiation oncology, treated with IV Decadron and has completed 10/10 doses of XRT. Work-up consistent with newly diagnosed IgG kappa MM with plans for CyBorD chemo to start following completion of XRT.  -Following transfer to rehab, he underwent chemotherapy on 03/15/2020 and next chemotherapy was scheduled for 03/22/2020.Patient had significant nosebleed on 03/17/2020. -Subsequently spiked fevers with T-max 102 -blood culture grew MSSA bacteremia, started on IV Ancef seen by ID as well for this -During this hospitalization; he was also noted to have a 5 x 2 cm left upper lobe chest wall mass on CT. -On 03/21/2020: He was found to be lethargic/less responsive.  Seen by neurology: Suspicious for seizure  activity.  Started on Keppra.  EEG negative.  MRI noted to have acute intracranial hemorrhage in the right frontal convexity, followed by neurology for this.  Mental status slowly improving. -Patient has been complaining of chest pain throughout at the site of his chest wall mass.  CTA negative for PE [place] course complicated by ileus and pleural effusion from third spacing, now improving -03/28/2020: Received platelet transfusion.  03/29/2020: CT-guided biopsy of left anterior chest wall mass  9/29/ 21-9/30/21 after extensive family meeting along with the palliative care team family is agreed to recommendation of comfort care measures  Assessment & Plan:   Acute encephalopathy multifactorial -Remains confused lethargic --- declining mentally and physically As patient's oral intake declines, with tapering off medications Continue comfort care measures   -Became lethargic and more confused on 03/20/2020.  There was a question of seizure.  He was transferred to neuro ICU on 03/22/2020 and started on Keppra.   EEG showed moderate encephalopathy with no epileptiform discharges. -  Neurology has signed off.  Continue Keppra   Acute intracranial hemorrhage Patient remained confused, lethargic no further work-up recommended at this time -no extensive changes per family MRI at bedside No new focal neurological findings  -On 9/12 patient was noted to be disoriented, CT head noted right frontal lesion concerning for hemorrhage , MRI noted acute hemorrhage involving anterior-inferior right frontal convexity, this is felt to be secondary to severe thrombocytopenia from effect of multiple myeloma and chemotherapy.  -Repeat CT head decreased size of hemorrhage -Appreciate neurology input, recommended baby aspirin when platelet count is higher and cleared by oncology -transfuse plst if <20K or actively bleeding as per oncology --Monitoring mental status  Severe sepsis: Present on admission -MSSA  bacteremia -Hemodynamically stable with exception of tachycardia heart rate of 116, blood pressure 146/92  this morning, respiratory 19, temp 99.4   -Currently sepsis has resolved.  Repeat blood cultures have been negative.  2D echo was unremarkable for vegetations.  TEE deferred especially in the setting of severe thrombocytopenia and complicated hospitalization -was  IV Ancef.  ID recommended to continue Ancef through 04/04/2020.  DC Ancef. -Currently on acyclovir, Decadron,  Metastatic multiple myeloma Epidural tumor extension at T6 and T9 with compressive myelopathy Hypercalcemia ... Monitoring --Originally admitted with this issue  -treated with radiation to thoracic spine due to concern for impending cord compression - MRI 02/14/20: Epidural tumor extension at T6 and T9 and T6 lesion severely narrowed thecal sac and deformed the spinal cord .  -Rx w/ velcade and dexamethasone on 9/7, acyclovir, diflucan. -Oncology following, further chemotherapy on hold with acute infection -Remains on Decadron every other day and prophylactic acyclovir -oncology reevaluation appreciated: Considering restarting treatment this week if his overall clinical status and counts stabilize.   -Oncology Dr. Lorenso Courier recommending no further treatment as side effect outweighs benefit and patient does not seem to be a candidate--- recommended comfort care measures-hospice  -Palliative care following.  Pleural-based soft tissue mass noted on recent CT -Pathology of biopsy showed mixed inflammation, debris and hemorrhage with no clear signs of malignancy.  Pancytopenia/anemia/severe thrombocytopenia -Status post multiple units of packed red cells transfusion and platelet transfusion during this hospitalization -Monitor.  Transfuse PRBC if hemoglobin less than 7 and platelets less than 10K or active bleeding - Status post 1 more unit of packed red cell transfusion on 04/04/2020.   -Hemoglobin 8.2, 8.0, 8.8  today -Platelets 52, 46, 33 today  Hyponatremia -Likely due to poor p.o intake, malnutrition, diuretics -Monitoring sodium level  Lasix discontinued on 04/02/2020.  Salt tablets started on 04/02/2020.  Continue  Hypokalemia/hypomagnesemia -Improved.  Epistaxis -Resolved -As needed treatment with Afrin   Chest pain/diffuse body pain -Secondary to diffuse bony involvement and chest wall mass.  EKG unremarkable.  Troponins negative.  CTA negative for PE.  Continue narcotics as needed.   -Appreciated palliative following, helping with pain management   Pleural effusions Third spacing: -Lasix discontinued on 04/02/2020. -Monitor  AKI/Short term HD in august 2021:  -Renal function improved and stable  T2DM -newly diagnosed in august 2021. a1c 6.5.  -Monitoring CBG QA CHS, SSI NovoLog coverage -Continue Lantus   Chronic constipation Ileus -Continue laxatives  HTN/HLD -Continue to hold bp meds amlodipine on hold, on coreg, was on  Midodrine -BP more stable now -DC'd midodrine on 04/04/2020.  Severe hypoalbuminemia -Could be secondary to underlying malignancy and complicated hospitalization -Follow nutrition recommendations -We will check labs any further  Weakness/debility/physical deconditioning:  Lower extremity weakness from spinal cord involvement -Education officer, museum following, prognosis remain poor, likely SNF if survives this hospitalization -Comfort care measures  Ethics: Extensive family meeting with hospice team at bedside along with recommendation from Dr. Lorenso Courier The detail of patient's care was discussed with all family members in person on 04/06/2020 and 04/07/2020 The family is on board with current recommendation of comfort care measures Pursuing hospice Comfort care measures Confirmed DNR/DNI status    DVT prophylaxis: SCDs Code Status: DNR Family Communication: Sister present at bedside on 04/02/2020  disposition Plan: Status is: Inpatient  Remains  inpatient appropriate because:Inpatient level of care appropriate due to severity of illness   Dispo: The patient is from: Home              Anticipated d/c is to: Likely hospice  Anticipated d/c date is: Anticipating patient to pass away in the next 24 hours              Patient currently is not medically stable to d/c.  Consultants: Neurology/oncology/IR  Procedures: Chest wall mass biopsy on 03/29/2020  Antimicrobials:  Anti-infectives (From admission, onward)   Start     Dose/Rate Route Frequency Ordered Stop   04/05/20 0100  ceFAZolin (ANCEF) IVPB 2g/100 mL premix  Status:  Discontinued        2 g 200 mL/hr over 30 Minutes Intravenous Every 8 hours 04/04/20 1802 04/05/20 0740   03/20/20 1600  ceFAZolin (ANCEF) IVPB 2g/100 mL premix  Status:  Discontinued        2 g 200 mL/hr over 30 Minutes Intravenous Every 8 hours 03/20/20 1505 04/04/20 1802   03/20/20 1000  acyclovir (ZOVIRAX) 200 MG capsule 400 mg  Status:  Discontinued        400 mg Oral Daily 03/16/2020 2217 04/06/20 1202   03/20/20 1000  fluconazole (DIFLUCAN) tablet 100 mg        100 mg Oral Daily 03/10/2020 2217 03/23/20 0959   03/20/20 0800  vancomycin (VANCOREADY) IVPB 750 mg/150 mL  Status:  Discontinued        750 mg 150 mL/hr over 60 Minutes Intravenous Every 12 hours 04/06/2020 2217 03/20/20 1452   03/20/20 0800  ceFEPIme (MAXIPIME) 2 g in sodium chloride 0.9 % 100 mL IVPB  Status:  Discontinued        2 g 200 mL/hr over 30 Minutes Intravenous Every 8 hours 03/20/20 0001 03/20/20 1452   03/20/20 0015  ceFEPIme (MAXIPIME) 2 g in sodium chloride 0.9 % 100 mL IVPB        2 g 200 mL/hr over 30 Minutes Intravenous  Once 03/20/20 0001 03/20/20 0159   03/18/2020 2230  vancomycin (VANCOREADY) IVPB 1250 mg/250 mL  Status:  Discontinued        1,250 mg 166.7 mL/hr over 90 Minutes Intravenous  Once 03/24/2020 2217 03/27/2020 2225   03/23/2020 2224  piperacillin-tazobactam (ZOSYN) IVPB 3.375 g  Status:  Discontinued         3.375 g 12.5 mL/hr over 240 Minutes Intravenous Every 8 hours 03/18/2020 2217 03/20/20 0000         Physical Exam:   General:   Arousable, confused, lethargic  HEENT:  Normocephalic, PERRL, otherwise with in Normal limits   Neuro:  CNII-XII intact. , normal motor and sensation, reflexes intact   Lungs:   Clear to auscultation BL, Respirations unlabored, no wheezes / crackles  Cardio:    S1/S2, RRR, No murmure, No Rubs or Gallops   Abdomen:   Soft, non-tender, bowel sounds active all four quadrants,  no guarding or peritoneal signs.  Muscular skeletal:   Severe generalized weaknesses Limited exam - in bed, able to move all 4 extremities, Normal strength,  2+ pulses,  symmetric, No pitting edema  Skin:  Dry, warm to touch, negative for any Rashes, No open wounds  Wounds: Please see nursing documentation Pressure Injury 03/16/20 Coccyx Mid;Right Stage 2 -  Partial thickness loss of dermis presenting as a shallow open injury with a red, pink wound bed without slough. admitted this admission 03/31/2020 (Active)  03/16/20 1324  Location: Coccyx  Location Orientation: Mid;Right  Staging: Stage 2 -  Partial thickness loss of dermis presenting as a shallow open injury with a red, pink wound bed without slough.  Wound Description (Comments): admitted this admission 03/31/2020  Present on Admission: Yes     Pressure Injury 03/16/20 Buttocks Left;Medial Stage 2 -  Partial thickness loss of dermis presenting as a shallow open injury with a red, pink wound bed without slough. POA 03/10/2020 (Active)  03/16/20 2200  Location: Buttocks  Location Orientation: Left;Medial  Staging: Stage 2 -  Partial thickness loss of dermis presenting as a shallow open injury with a red, pink wound bed without slough.  Wound Description (Comments): POA 03/21/2020  Present on Admission: Yes          Objective:  Vitals:   04/06/20 1135 04/07/20 0000 04/07/20 0004 04/07/20 0220  BP:   (!) 88/53   Pulse:  (!) 131    Resp:  (!) 25 (!) 25    Temp:  (!) 102.1 F (38.9 C)  (!) 101.4 F (38.6 C)  TempSrc:  Axillary  Axillary  SpO2: 93% 96%    Weight:      Height:       No intake or output data in the 24 hours ending 04/07/20 1305 Filed Weights   04/04/20 0435 04/05/20 0400 04/06/20 0633  Weight: 62.1 kg 60.1 kg 60.4 kg           data Reviewed: I have personally reviewed following labs and imaging studies  CBC: Recent Labs  Lab 04/02/20 0723 04/02/20 0723 04/03/20 0341 04/04/20 0233 04/04/20 2130 04/05/20 0230 04/06/20 0547  WBC 4.5   < > 4.1 4.5 6.3 5.6 7.1  NEUTROABS 3.5  --  3.1 3.4  --  4.1 5.3  HGB 7.6*   < > 7.0* 6.9* 8.2* 8.0* 8.8*  HCT 21.7*   < > 20.5* 19.9* 24.5* 23.7* 26.3*  MCV 86.5   < > 86.5 87.3 87.8 87.8 88.9  PLT 95*   < > 74* 59* 52* 46* 33*   < > = values in this interval not displayed.   Basic Metabolic Panel: Recent Labs  Lab 04/01/20 0717 04/01/20 0717 04/02/20 0723 04/03/20 0341 04/04/20 0233 04/05/20 0230 04/06/20 0547  NA 125*  --  124* 126* 127* 127*  --   K 3.3*  --  2.8* 3.6 4.2 4.1  --   CL 89*  --  84* 92* 93* 95*  --   CO2 26  --  '27 25 26 23  ' --   GLUCOSE 181*  --  121* 146* 224* 202*  --   BUN 28*  --  30* 28* 32* 28*  --   CREATININE 0.73  --  0.64 0.68 0.60* 0.61  --   CALCIUM 9.3  --  9.5 9.7 9.7 9.6  --   MG 1.6*   < > 1.6* 1.5* 1.9 1.7 1.7   < > = values in this interval not displayed.   GFR: Estimated Creatinine Clearance: 83 mL/min (by C-G formula based on SCr of 0.61 mg/dL). Liver Function Tests: No results for input(s): AST, ALT, ALKPHOS, BILITOT, PROT, ALBUMIN in the last 168 hours. No results for input(s): LIPASE, AMYLASE in the last 168 hours. No results for input(s): AMMONIA in the last 168 hours. Coagulation Profile: No results for input(s): INR, PROTIME in the last 168 hours. Cardiac Enzymes: No results for input(s): CKTOTAL, CKMB, CKMBINDEX, TROPONINI in the last 168 hours. BNP (last 3 results) No results for input(s):  PROBNP in the last 8760 hours. HbA1C: No results for input(s): HGBA1C in the last 72 hours. CBG: Recent Labs  Lab 04/05/20 1158 04/05/20 1628 04/05/20 2145 04/06/20 0802 04/06/20 1108  GLUCAP 159* 170* 165* 164* 138*   Lipid Profile: No results for input(s): CHOL, HDL, LDLCALC, TRIG, CHOLHDL, LDLDIRECT in the last 72 hours. Thyroid Function Tests: No results for input(s): TSH, T4TOTAL, FREET4, T3FREE, THYROIDAB in the last 72 hours. Anemia Panel: No results for input(s): VITAMINB12, FOLATE, FERRITIN, TIBC, IRON, RETICCTPCT in the last 72 hours. Sepsis Labs: No results for input(s): PROCALCITON, LATICACIDVEN in the last 168 hours.  No results found for this or any previous visit (from the past 240 hour(s)).       Radiology Studies: DG ABD ACUTE 2+V W 1V CHEST  Result Date: 04/05/2020 CLINICAL DATA:  Abdominal pain EXAM: X-RAY ABDOMEN 3 VIEW COMPARISON:  Chest 03/30/2020.  Abdomen 04/05/2020 FINDINGS: Normal heart size. Patchy linear and airspace infiltrates in both mid lungs and in the right lower lung likely representing multifocal pneumonia. No significant progression since previous study. No pleural effusions. No pneumothorax. Mediastinal contours appear intact. Old and acute rib fractures. Multiple small focal lucencies within the visualized bones suggesting multiple myeloma. Moderately dilated gas-filled small and large bowel with large amount of stool in the rectosigmoid region. Changes likely represent adynamic ileus although obstipation may also be present. No abnormal air-fluid levels. No free air in the abdomen. No radiopaque stones. Degenerative changes in the spine. IMPRESSION: 1. Patchy linear and airspace infiltrates in both mid lungs and in the right lower lung likely representing multifocal pneumonia. 2. Multiple rib fractures. Scattered bone lucencies suggest multiple myeloma. 3. Moderately dilated gas-filled small and large bowel with large amount of stool in the  rectosigmoid region. Changes likely represent adynamic ileus although obstipation may also be present. Electronically Signed   By: Lucienne Capers M.D.   On: 04/05/2020 23:17        Scheduled Meds: . calcium carbonate  400 mg of elemental calcium Oral BID WC  . carvedilol  3.125 mg Oral BID WC  . gabapentin  300 mg Oral QHS  . levETIRAcetam  500 mg Oral BID  . lidocaine  2 patch Transdermal Q24H  . morphine  30 mg Oral Q12H  . senna-docusate  2 tablet Oral BID  . sodium chloride  2 spray Each Nare Q1H while awake   Continuous Infusions:         Deatra James, MD [facility] 04/07/2020, 1:05 PM

## 2020-04-07 NOTE — Progress Notes (Addendum)
Paged [facility] about possibly getting a letter written for patients family in [place] to allow them come to see their love one. Chotiner, MD stated that he could not do it, instead it had to be the attending doctor. Notified family and will pass on to day shift nurse.   Family members wanting to come and see patient (for letter)  Father of patient: Trevor Shelton Sister of patient: Debbra Riding Sisters son: Bernie Ransford Sister of patient: Torre Schaumburg

## 2020-04-07 NOTE — Progress Notes (Signed)
Patient ID: Trevor Shelton, male   DOB: 1966-05-16, 54 y.o.   MRN: [id number]  This NP visited patient at the bedside as a follow up for palliaitve medicine needs and emotional support, many family members  are gathered in quiet presence as Trevor Shelton transitions at EOL.  Decision to shift to full comfort made yesterday  Patient appears somewhat uncomfortable; dyspneic and wide eyed. Nursing tells me there is reluctance to utilize prn medications.  Utilization of computer interpretor, education offered regarding current medical situation and natural trajectory and expectations at EOL. Education offered regarding utilization of Morphine for symptom management.     Family verbalize  understanding and acceptance of scheduled and prn dosing of Morphine to promote comfort.    Prognosis is likely hours to days.   Plan of Care: -Focus of care is comfort and dignity-- allowing for natural death -DNR/DNI - no further diagnostics, no cardiac monitoring, lab draws -minimize medications to only those enhancing comfort and reducing suffering      - Pain/dyspnea-  Do not escalate oxygen, utilize              Morphine 2 mg IV schuduled every 6 hrs                Morphine 2 mg every hr prn      - Agitation/anxiety:  Ativan 2 mg every 4 hrs prn   Emotional support offered  Questions and concerns addressed     Education offered to  bedside RN regarding symptom management  at EOL   Total time spent on the unit was 35  minutes    PMT will continue to support holistically   Greater than 50% of the time was spent in counseling and coordination of care  Wadie Lessen NP  Palliative Medicine Team Team Phone # [phone] Pager [phone]

## 2020-04-07 NOTE — Progress Notes (Signed)
Patient slept most of morning . No s/s pain noted. Visiting with family when they arrived. Continued to offer pain medication and patient refused.NP into speak with patient and family and encouraged patient to ask for and use pain medication. Family encouraged to work with patient and keep him comfortable. Reviewed use of morphine to decrease pain and decrease breathing difficulty. Family and patient responsive to education and asking for pain medication more frequently.

## 2020-04-08 NOTE — Care Management (Signed)
1554 04-08-20 Case Manager received call from the [employer]- Officer Alonzo. Officer Jonathon Bellows was aware that the patient was hospitalized; she needed to verify to get the father and daughter a permit to cross [place] board[place]. Bethena Roys, RN,BSN Case Manager

## 2020-04-08 NOTE — Progress Notes (Signed)
Patient ID: Trevor Shelton, male   DOB: 20-Nov-1965, 54 y.o.   MRN: [id number]  PROGRESS NOTE    Trevor Shelton  AUQ:[id number] DOB: 1966-02-17 DOA: 04/04/2020 PCP: Patient, No Pcp Per    Subjective:  The patient was seen and examined this morning, awake, agonal breathing, confused unable to speak clearly  Wife and extended family present at bedside  --------------------------------------------------------------------------------------------------------------------------------------  Brief Narrative:  54 year old male with very long/complex hospitalization at [facility] long hospital recently in 02/2020 with back pain and was found to have diffuse lytic lesions throughout the skeleton, epidural tumor extension with pathological fracture of T6 and T7, contrast-enhancing mass replacing most of T5 vertebral body with severe cord compression, and acute renal failure, required couple of hemodialysis sessions, kidney function improved. --Followed by medical and radiation oncology, treated with IV Decadron and has completed 10/10 doses of XRT. Work-up consistent with newly diagnosed IgG kappa MM with plans for CyBorD chemo to start following completion of XRT.  -Following transfer to rehab, he underwent chemotherapy on 03/15/2020 and next chemotherapy was scheduled for 03/22/2020.Patient had significant nosebleed on 03/17/2020. -Subsequently spiked fevers with T-max 102 -blood culture grew MSSA bacteremia, started on IV Ancef seen by ID as well for this -During this hospitalization; he was also noted to have a 5 x 2 cm left upper lobe chest wall mass on CT. -On 03/21/2020: He was found to be lethargic/less responsive.  Seen by neurology: Suspicious for seizure activity.  Started on Keppra.  EEG negative.  MRI noted to have acute intracranial hemorrhage in the right frontal convexity, followed by neurology for this.  Mental status slowly improving. -Patient has been complaining of chest pain throughout at the site of  his chest wall mass.  CTA negative for PE [facility] course complicated by ileus and pleural effusion from third spacing, now improving -03/28/2020: Received platelet transfusion.  03/29/2020: CT-guided biopsy of left anterior chest wall mass  9/29/ 21-9/30/21 after extensive family meeting along with the palliative care team family is agreed to recommendation of comfort care measures  Assessment & Plan:    Patient was seen and examined this morning no changes to below finding patient and family has proceed with comfort care measure. Patient is found more confused, tachypneic, tachycardic, agonal breathing, pale, hypotensive.   Poor prognosis -anticipating high chance for demise within the next 12 hours    Acute encephalopathy multifactorial -Remains confused lethargic --- declining mentally and physically As patient's oral intake declines, with tapering off medications Continue comfort care measures   -Became lethargic and more confused on 03/20/2020.  There was a question of seizure.  He was transferred to neuro ICU on 03/22/2020 and started on Keppra.   EEG showed moderate encephalopathy with no epileptiform discharges. -  Neurology has signed off.  Continue Keppra   Acute intracranial hemorrhage Patient remained confused, lethargic no further work-up recommended at this time -no extensive changes per family MRI at bedside No new focal neurological findings  -On 9/12 patient was noted to be disoriented, CT head noted right frontal lesion concerning for hemorrhage , MRI noted acute hemorrhage involving anterior-inferior right frontal convexity, this is felt to be secondary to severe thrombocytopenia from effect of multiple myeloma and chemotherapy.  -Repeat CT head decreased size of hemorrhage -Appreciate neurology input, recommended baby aspirin when platelet count is higher and cleared by oncology -transfuse plst if <20K or actively bleeding as per oncology --Monitoring mental  status  Severe sepsis: Present on admission -MSSA bacteremia -Hemodynamically stable with exception  of tachycardia heart rate of 116, blood pressure 146/92 this morning, respiratory 19, temp 99.4   -Currently sepsis has resolved.  Repeat blood cultures have been negative.  2D echo was unremarkable for vegetations.  TEE deferred especially in the setting of severe thrombocytopenia and complicated hospitalization -was  IV Ancef.  ID recommended to continue Ancef through 04/04/2020.  DC Ancef. -Currently on acyclovir, Decadron,  Metastatic multiple myeloma Epidural tumor extension at T6 and T9 with compressive myelopathy Hypercalcemia ... Monitoring --Originally admitted with this issue  -treated with radiation to thoracic spine due to concern for impending cord compression - MRI 02/14/20: Epidural tumor extension at T6 and T9 and T6 lesion severely narrowed thecal sac and deformed the spinal cord .  -Rx w/ velcade and dexamethasone on 9/7, acyclovir, diflucan. -Oncology following, further chemotherapy on hold with acute infection -Remains on Decadron every other day and prophylactic acyclovir -oncology reevaluation appreciated: Considering restarting treatment this week if his overall clinical status and counts stabilize.   -Oncology Dr. Lorenso Courier recommending no further treatment as side effect outweighs benefit and patient does not seem to be a candidate--- recommended comfort care measures-hospice  -Palliative care following.  Pleural-based soft tissue mass noted on recent CT -Pathology of biopsy showed mixed inflammation, debris and hemorrhage with no clear signs of malignancy.  Pancytopenia/anemia/severe thrombocytopenia -Status post multiple units of packed red cells transfusion and platelet transfusion during this hospitalization -Monitor.  Transfuse PRBC if hemoglobin less than 7 and platelets less than 10K or active bleeding - Status post 1 more unit of packed red cell transfusion on  04/04/2020.   -Hemoglobin 8.2, 8.0, 8.8 today -Platelets 52, 46, 33 today  Hyponatremia -Likely due to poor p.o intake, malnutrition, diuretics -Monitoring sodium level  Lasix discontinued on 04/02/2020.  Salt tablets started on 04/02/2020.  Continue  Hypokalemia/hypomagnesemia -Improved.  Epistaxis -Resolved -As needed treatment with Afrin   Chest pain/diffuse body pain -Secondary to diffuse bony involvement and chest wall mass.  EKG unremarkable.  Troponins negative.  CTA negative for PE.  Continue narcotics as needed.   -Appreciated palliative following, helping with pain management   Pleural effusions Third spacing: -Lasix discontinued on 04/02/2020. -Monitor  AKI/Short term HD in august 2021:  -Renal function improved and stable  T2DM -newly diagnosed in august 2021. a1c 6.5.  -Monitoring CBG QA CHS, SSI NovoLog coverage -Continue Lantus   Chronic constipation Ileus -Continue laxatives  HTN/HLD -Continue to hold bp meds amlodipine on hold, on coreg, was on  Midodrine -BP more stable now -DC'd midodrine on 04/04/2020.  Severe hypoalbuminemia -Could be secondary to underlying malignancy and complicated hospitalization -Follow nutrition recommendations -We will check labs any further  Weakness/debility/physical deconditioning:  Lower extremity weakness from spinal cord involvement -Education officer, museum following, prognosis remain poor, likely SNF if survives this hospitalization -Comfort care measures  Ethics: Extensive family meeting with hospice team at bedside along with recommendation from Dr. Lorenso Courier The detail of patient's care was discussed with all family members in person on 04/06/2020 and 04/07/2020 The family is on board with current recommendation of comfort care measures Pursuing hospice Comfort care measures Confirmed DNR/DNI status    DVT prophylaxis: SCDs Code Status: DNR Family Communication: Sister present at bedside on 04/02/2020  disposition  Plan: Status is: Inpatient  Remains inpatient appropriate because:Inpatient level of care appropriate due to severity of illness   Dispo: The patient is from: Home              Anticipated d/c is to:  Likely hospice              Anticipated d/c date is: Anticipating patient to pass away in the next 24 hours              Patient currently is not medically stable to d/c.  Consultants: Neurology/oncology/IR  Procedures: Chest wall mass biopsy on 03/29/2020  Antimicrobials:  Anti-infectives (From admission, onward)   Start     Dose/Rate Route Frequency Ordered Stop   04/05/20 0100  ceFAZolin (ANCEF) IVPB 2g/100 mL premix  Status:  Discontinued        2 g 200 mL/hr over 30 Minutes Intravenous Every 8 hours 04/04/20 1802 04/05/20 0740   03/20/20 1600  ceFAZolin (ANCEF) IVPB 2g/100 mL premix  Status:  Discontinued        2 g 200 mL/hr over 30 Minutes Intravenous Every 8 hours 03/20/20 1505 04/04/20 1802   03/20/20 1000  acyclovir (ZOVIRAX) 200 MG capsule 400 mg  Status:  Discontinued        400 mg Oral Daily 03/22/2020 2217 04/06/20 1202   03/20/20 1000  fluconazole (DIFLUCAN) tablet 100 mg        100 mg Oral Daily 03/16/2020 2217 03/23/20 0959   03/20/20 0800  vancomycin (VANCOREADY) IVPB 750 mg/150 mL  Status:  Discontinued        750 mg 150 mL/hr over 60 Minutes Intravenous Every 12 hours 03/18/2020 2217 03/20/20 1452   03/20/20 0800  ceFEPIme (MAXIPIME) 2 g in sodium chloride 0.9 % 100 mL IVPB  Status:  Discontinued        2 g 200 mL/hr over 30 Minutes Intravenous Every 8 hours 03/20/20 0001 03/20/20 1452   03/20/20 0015  ceFEPIme (MAXIPIME) 2 g in sodium chloride 0.9 % 100 mL IVPB        2 g 200 mL/hr over 30 Minutes Intravenous  Once 03/20/20 0001 03/20/20 0159   03/18/2020 2230  vancomycin (VANCOREADY) IVPB 1250 mg/250 mL  Status:  Discontinued        1,250 mg 166.7 mL/hr over 90 Minutes Intravenous  Once 03/21/2020 2217 03/12/2020 2225   03/24/2020 2224  piperacillin-tazobactam (ZOSYN) IVPB  3.375 g  Status:  Discontinued        3.375 g 12.5 mL/hr over 240 Minutes Intravenous Every 8 hours 03/26/2020 2217 03/20/20 0000           Physical Exam:   General:   Awake confused difficulty speaking  HEENT:  Normocephalic, PERRL, otherwise with in Normal limits   Neuro:   Limited exam as patient is confused, following and tracking voice  CNII-XII intact. ,    Lungs:    Agonal breathing, crackles at lower base  Cardio:    S1/S2, RRR, No murmure, No Rubs or Gallops   Abdomen:   Soft, non-tender, bowel sounds active all four quadrants,  no guarding   Muscular skeletal:  Limited exam - in bed, able to move all 4 extremities, Normal strength,  2+ pulses,  symmetric, No pitting edema  Skin:  Dry, warm to touch, negative for any Rashes, No open wounds  Wounds: Please see nursing documentation Pressure Injury 03/16/20 Coccyx Mid;Right Stage 2 -  Partial thickness loss of dermis presenting as a shallow open injury with a red, pink wound bed without slough. admitted this admission 03/30/2020 (Active)  03/16/20 1324  Location: Coccyx  Location Orientation: Mid;Right  Staging: Stage 2 -  Partial thickness loss of dermis presenting as a shallow open injury with  a red, pink wound bed without slough.  Wound Description (Comments): admitted this admission 03/16/2020  Present on Admission: Yes     Pressure Injury 03/16/20 Buttocks Left;Medial Stage 2 -  Partial thickness loss of dermis presenting as a shallow open injury with a red, pink wound bed without slough. POA 03/09/2020 (Active)  03/16/20 2200  Location: Buttocks  Location Orientation: Left;Medial  Staging: Stage 2 -  Partial thickness loss of dermis presenting as a shallow open injury with a red, pink wound bed without slough.  Wound Description (Comments): POA 03/14/2020  Present on Admission: Yes             Objective:  Vitals:   04/07/20 0000 04/07/20 0004 04/07/20 0220 04/07/20 2100  BP:  (!) 88/53  102/90  Pulse: (!) 131   (!)  120  Resp: (!) 25   (!) 22  Temp: (!) 102.1 F (38.9 C)  (!) 101.4 F (38.6 C) 99.2 F (37.3 C)  TempSrc: Axillary  Axillary Axillary  SpO2: 96%   90%  Weight:      Height:       No intake or output data in the 24 hours ending 04/08/20 1244 Filed Weights   04/04/20 0435 04/05/20 0400 04/06/20 0633  Weight: 62.1 kg 60.1 kg 60.4 kg           data Reviewed: I have personally reviewed following labs and imaging studies  CBC: Recent Labs  Lab 04/02/20 0723 04/02/20 0723 04/03/20 0341 04/04/20 0233 04/04/20 2130 04/05/20 0230 04/06/20 0547  WBC 4.5   < > 4.1 4.5 6.3 5.6 7.1  NEUTROABS 3.5  --  3.1 3.4  --  4.1 5.3  HGB 7.6*   < > 7.0* 6.9* 8.2* 8.0* 8.8*  HCT 21.7*   < > 20.5* 19.9* 24.5* 23.7* 26.3*  MCV 86.5   < > 86.5 87.3 87.8 87.8 88.9  PLT 95*   < > 74* 59* 52* 46* 33*   < > = values in this interval not displayed.   Basic Metabolic Panel: Recent Labs  Lab 04/02/20 0723 04/03/20 0341 04/04/20 0233 04/05/20 0230 04/06/20 0547  NA 124* 126* 127* 127*  --   K 2.8* 3.6 4.2 4.1  --   CL 84* 92* 93* 95*  --   CO2 '27 25 26 23  ' --   GLUCOSE 121* 146* 224* 202*  --   BUN 30* 28* 32* 28*  --   CREATININE 0.64 0.68 0.60* 0.61  --   CALCIUM 9.5 9.7 9.7 9.6  --   MG 1.6* 1.5* 1.9 1.7 1.7   GFR: Estimated Creatinine Clearance: 83 mL/min (by C-G formula based on SCr of 0.61 mg/dL). Liver Function Tests: No results for input(s): AST, ALT, ALKPHOS, BILITOT, PROT, ALBUMIN in the last 168 hours. No results for input(s): LIPASE, AMYLASE in the last 168 hours. No results for input(s): AMMONIA in the last 168 hours. Coagulation Profile: No results for input(s): INR, PROTIME in the last 168 hours. Cardiac Enzymes: No results for input(s): CKTOTAL, CKMB, CKMBINDEX, TROPONINI in the last 168 hours. BNP (last 3 results) No results for input(s): PROBNP in the last 8760 hours. HbA1C: No results for input(s): HGBA1C in the last 72 hours. CBG: Recent Labs  Lab  04/05/20 1158 04/05/20 1628 04/05/20 2145 04/06/20 0802 04/06/20 1108  GLUCAP 159* 170* 165* 164* 138*   Lipid Profile: No results for input(s): CHOL, HDL, LDLCALC, TRIG, CHOLHDL, LDLDIRECT in the last 72 hours. Thyroid Function Tests:  No results for input(s): TSH, T4TOTAL, FREET4, T3FREE, THYROIDAB in the last 72 hours. Anemia Panel: No results for input(s): VITAMINB12, FOLATE, FERRITIN, TIBC, IRON, RETICCTPCT in the last 72 hours. Sepsis Labs: No results for input(s): PROCALCITON, LATICACIDVEN in the last 168 hours.  No results found for this or any previous visit (from the past 240 hour(s)).       Radiology Studies: No results found.      Scheduled Meds: . carvedilol  3.125 mg Oral BID WC  . lidocaine  2 patch Transdermal Q24H  .  morphine injection  2 mg Intravenous Q6H   Continuous Infusions:         Deatra James, MD [facility] 04/08/2020, 12:44 PM

## 2020-04-08 DEATH — deceased

## 2020-04-09 MED ORDER — GLYCOPYRROLATE 0.2 MG/ML IJ SOLN
0.2000 mg | Freq: Four times a day (QID) | INTRAMUSCULAR | Status: DC
  Administered 2020-04-09 (×3): 0.2 mg via INTRAVENOUS
  Filled 2020-04-09 (×3): qty 1

## 2020-04-09 MED ORDER — MORPHINE 100MG IN NS 100ML (1MG/ML) PREMIX INFUSION
2.0000 mg/h | INTRAVENOUS | Status: DC
  Administered 2020-04-09: 1 mg/h via INTRAVENOUS
  Filled 2020-04-09: qty 100

## 2020-04-09 MED ORDER — MORPHINE SULFATE (PF) 2 MG/ML IV SOLN
2.0000 mg | INTRAVENOUS | Status: DC
  Administered 2020-04-09 (×2): 2 mg via INTRAVENOUS
  Filled 2020-04-09 (×5): qty 1

## 2020-04-09 MED ORDER — LORAZEPAM 2 MG/ML IJ SOLN
4.0000 mg | INTRAMUSCULAR | Status: DC | PRN
  Administered 2020-04-09: 4 mg via INTRAVENOUS
  Filled 2020-04-09: qty 2

## 2020-04-09 NOTE — Progress Notes (Signed)
Dr. Benny Lennert in to see pt . Had discussed with family the need for ongoing morphine  Medication. Had given verbal order for Morphine gtts , titration for for pt comfort/pain management . Diona Browner RN in the room and this RN .

## 2020-04-09 NOTE — Progress Notes (Signed)
PROGRESS NOTE  Trevor Shelton [id number] DOB: Nov 30, 1965 DOA: 04/06/2020 PCP: Patient, No Pcp Per  Brief History   54 year old male with very long/complex hospitalization at [facility] recently in 02/2020 with back pain and was found to have diffuse lytic lesions throughout the skeleton, epidural tumor extension with pathological fracture of T6 and T7, contrast-enhancing mass replacing most of T5 vertebral body with severe cord compression, and acute renal failure, required couple of hemodialysis sessions, kidney function improved. --Followed by medical and radiation oncology, treated with IV Decadron and has completed 10/10 doses of XRT. Work-up consistent with newly diagnosed IgG kappa MM with plans for CyBorD chemo to start following completion of XRT.  -Following transfer to rehab, he underwent chemotherapy on 03/15/2020 and next chemotherapy was scheduled for 03/22/2020.Patient had significant nosebleed on 03/17/2020. -Subsequently spiked fevers with T-max 102 -blood culture grew MSSA bacteremia, started on IV Ancef seen by ID as well for this -During this hospitalization; he was also noted to have a 5 x 2 cm left upper lobechest wallmass on CT. -On 03/21/2020: He was found to be lethargic/less responsive.  Seen by neurology: Suspicious for seizure activity.  Started on Keppra.  EEG negative.  MRI noted to have acute intracranial hemorrhage in the right frontal convexity, followed by neurology for this.  Mental status slowly improving. -Patient has been complaining of chest pain throughout at the site of his chest wall mass.  CTA negative for PE [facility] course complicated by ileus and pleural effusion from third spacing, now improving -03/28/2020: Received platelet transfusion.  03/29/2020: CT-guided biopsy of left anterior chest wall mass  9/29/ 21-9/30/21 after extensive family meeting along with the palliative care team family is agreed to recommendation of comfort care  measures  Consultants  . Palliative care . Oncology . Radiation oncology  Procedures  . CT guided biopsy of left anterior chest wall mass . Platelet transfusion  Antibiotics   Anti-infectives (From admission, onward)   Start     Dose/Rate Route Frequency Ordered Stop   04/05/20 0100  ceFAZolin (ANCEF) IVPB 2g/100 mL premix  Status:  Discontinued        2 g 200 mL/hr over 30 Minutes Intravenous Every 8 hours 04/04/20 1802 04/05/20 0740   03/20/20 1600  ceFAZolin (ANCEF) IVPB 2g/100 mL premix  Status:  Discontinued        2 g 200 mL/hr over 30 Minutes Intravenous Every 8 hours 03/20/20 1505 04/04/20 1802   03/20/20 1000  acyclovir (ZOVIRAX) 200 MG capsule 400 mg  Status:  Discontinued        400 mg Oral Daily 03/20/2020 2217 04/06/20 1202   03/20/20 1000  fluconazole (DIFLUCAN) tablet 100 mg        100 mg Oral Daily 03/31/2020 2217 03/23/20 0959   03/20/20 0800  vancomycin (VANCOREADY) IVPB 750 mg/150 mL  Status:  Discontinued        750 mg 150 mL/hr over 60 Minutes Intravenous Every 12 hours 03/18/2020 2217 03/20/20 1452   03/20/20 0800  ceFEPIme (MAXIPIME) 2 g in sodium chloride 0.9 % 100 mL IVPB  Status:  Discontinued        2 g 200 mL/hr over 30 Minutes Intravenous Every 8 hours 03/20/20 0001 03/20/20 1452   03/20/20 0015  ceFEPIme (MAXIPIME) 2 g in sodium chloride 0.9 % 100 mL IVPB        2 g 200 mL/hr over 30 Minutes Intravenous  Once 03/20/20 0001 03/20/20 0159   03/29/2020 2230  vancomycin (  VANCOREADY) IVPB 1250 mg/250 mL  Status:  Discontinued        1,250 mg 166.7 mL/hr over 90 Minutes Intravenous  Once 03/17/2020 2217 03/27/2020 2225   03/12/2020 2224  piperacillin-tazobactam (ZOSYN) IVPB 3.375 g  Status:  Discontinued        3.375 g 12.5 mL/hr over 240 Minutes Intravenous Every 8 hours 03/17/2020 2217 03/20/20 0000    .   Subjective  The patient is lying in bed with eyes half-open, moribund, but agitated. He appears to be actively dying. Mother and sister are at  bedside.  Objective   Vitals:  Vitals:   04/09/20 0100 04/09/20 1650  BP:  109/73  Pulse:  (!) 134  Resp:  (!) 40  Temp: 99.6 F (37.6 C) 98.2 F (36.8 C)  SpO2:     Exam:  Constitutional:  . The patient is moribund, agitated.  Respiratory:  . No increased work of breathing. . No wheezes, rales, or rhonchi . No tactile fremitus . There are coarse upper airway sounds. Cardiovascular:  . Regular rate and rhythm . No murmurs, ectopy, or gallups. . No lateral PMI. No thrills. Abdomen:  . Abdomen is soft, non-tender, non-distended . No hernias, masses, or organomegaly . Normoactive bowel sounds.  Musculoskeletal:  . No cyanosis, clubbing, or edema Skin:  . No rashes, lesions, ulcers . palpation of skin: no induration or nodules Neurologic:  Unable to evaluate due to the patient's inability to cooperate with exam. Psychiatric:  . Unable to evaluate due to the patient's inability to cooperate with exam.  I have personally reviewed the following:   Today's Data  . Vitals  Scheduled Meds: . carvedilol  3.125 mg Oral BID WC  . glycopyrrolate  0.2 mg Intravenous QID  . lidocaine  2 patch Transdermal Q24H  .  morphine injection  2 mg Intravenous Q2H   Continuous Infusions: . morphine 1 mg/hr (04/09/20 1139)    Principal Problem:   MSSA bacteremia Active Problems:   AKI (acute kidney injury) ([place])   Pathologic fracture   Hyponatremia   Multiple myeloma not having achieved remission ([place])   Diabetes mellitus ([place])   Hypertension   Constipation   Incomplete paraplegia ([facility])   Pancytopenia ([facility])   SOB (shortness of breath)   AMS (altered mental status)   Intracerebral hemorrhage   Pneumonia   Delirium   End of life care   DNR (do not resuscitate)   Adult failure to thrive   Uncontrolled pain   Cancer associated pain   LOS: 21 days    A & P  Acute encephalopathy multifactorial Remains confused lethargic --- declining mentally and physically As  patient's oral intake declines, with tapering off medications. Became lethargic and more confused on 03/20/2020.  There was a question of seizure.  He was transferred to neuro ICU on 03/22/2020 and started on Keppra.   EEG showed moderate encephalopathy with no epileptiform discharges. Neurology has signed off.  Continue Keppra.   Acute intracranial hemorrhage: Patient now pre-morbid. Continue comfort care measures. Morphine changed to gtt and dose increased.  Severe sepsis:Currently sepsis has resolved. Present on admission -MSSA bacteremia: Hemodynamically stable with exception of tachycardia heart rate of 116, blood pressure 146/92 this morning, respiratory 19, temp 99.4.  Metastatic multiple myeloma: Epidural tumor extension at T6 and T9 with compressive myelopathy. Oncology Dr. Lorenso Courier recommending no further treatment as side effect outweighs benefit and patient does not seem to be a candidate--- recommended comfort care measures-hospice.Continue comfort care measures.  Morphine changed to gtt and dose increased. Palliative care following.  Pleural-based soft tissue mass noted on recent CT: Pathology of biopsy showed mixed inflammation, debris and hemorrhage with no clear signs of malignancy.  Pancytopenia/anemia/severe thrombocytopenia: Continue comfort care measures. Morphine changed to gtt and dose increased.  Hyponatremia: Likely due to poor p.o intake, malnutrition, diuretics. Continue comfort care measures. Morphine changed to gtt and dose increased.  Hypokalemia/hypomagnesemia: Continue comfort care measures. Morphine changed to gtt and dose increased.  Epistaxis: Resolved. As needed treatment with Afrin. Continue comfort care measures. Morphine changed to gtt and dose increased.   Chest pain/diffuse body pain: Continue comfort care measures. Morphine changed to gtt and dose increased.  Pleural effusions: Third spacing. Continue comfort care measures. Morphine changed to gtt  and dose increased.  AKI/Short term HD in august 2021: Continue comfort care measures. Morphine changed to gtt and dose increased.  T2DM: Continue comfort care measures. Morphine changed to gtt and dose increased.  Chronic constipation: Ileus  HTN/HLD: Continue comfort care measures. Morphine changed to gtt and dose increased.  Severe hypoalbuminemia: Continue comfort care measures. Morphine changed to gtt and dose increased.  Weakness/debility/physical deconditioning:Continue comfort care measures. Morphine changed to gtt and dose increased.  Ethics: Extensive family meeting with hospice team at bedside along with recommendation from Dr. Lorenso Courier. The detail of patient's care was discussed with all family members in person on 04/06/2020 and 04/07/2020. The family is on board with current recommendation of comfort care measures Pursuing hospice. Continue comfort care measures. Morphine changed to gtt and dose increased. Confirmed DNR/DNI status.  DVT prophylaxis: SCDs Code Status: DNR Family Communication: Sister present at bedside on 04/02/2020  disposition Plan: Status is: Inpatient  Remains inpatient appropriate because:Inpatient level of care appropriate due to severity of illness   Dispo: The patient is from: Home  Anticipated d/c is to: Likely hospice  Anticipated d/c date is: Anticipating patient to pass away in the next 24 hours  Patient currently is not medically stable to d/c.   Osmany Azer, DO [facility] Direct contact: see [email or web address]  7PM-7AM contact night coverage as above 04/09/2020, 7:21 PM  LOS: 21 days

## 2020-04-09 NOTE — Progress Notes (Signed)
Patient continues to have labored breathing throughout the night, PRN and scheduled medications given to make him comfortable. Wife and sister at bedside, will continue to monitor and support patient and family.

## 2020-04-10 ENCOUNTER — Encounter (HOSPITAL_COMMUNITY): Payer: Self-pay | Admitting: Internal Medicine

## 2020-05-09 NOTE — Discharge Summary (Signed)
Physician Death Summary  Trevor Shelton:[id number] DOB: 05-18-1966 DOA: 2020/03/21  PCP: Patient, No Pcp Per  Admit date: 2020/03/21 Date and time of death: April 12, 2020 00:20  Discharge Diagnoses: Principal diagnosis is #1 1. Acute encephalopathy, multifactorial 2. Acute intracranial hemorrhage 3. Severe sepsis due to MSSA bacteremia 4. Metastatic multiple myeloma 5. Pleural based soft tissue mass 6. Pancytopenia/anemia, severe thrombocytopenia 7. Hyponatremia 8. Hypokalemia/hypomagnesemia 9. Epistaxis 10. Chest pain/diffuse body pain 11. Pleural effusions 12. AKI 13. DM II 14. Chronic constipation 15. Hypertension 16. Hyperlipidemia 17. Severe hypoalbuminemia 18. Weakness/debility/physical deconditioning  Discharge Condition: Deceased  Filed Weights   04-07-2020 0400 04/06/20 3546 04/12/20 0043  Weight: 60.1 kg 60.4 kg 60.4 kg   History of present illness:  Trevor Shelton is a 54 y.o. male with a PMH of HTN and HLD who was transferred from inpatient rehab for fevers and abnormal labs.  Spanish interpreter at bedside.  He is fully vaccinated with further vaccines.   Patient was initially admitted to inpatient service between 8/8 and 03/03/2020.  The CT-guided bone marrow biopsy on 02/19/20 showed positive for neoplasm kappa light chain consistent with multiple myeloma. Imaging showed multiple spinal lytic lesions, left rib fracture on ninth and 10th, cord compression. He finished 10 sessions of radiation therapy. Currently on Decadron for cord compression.   Patient was discharged to inpatient rehab on 03/03/2020.  He underwent chemotherapy on 03/15/2020 and next chemotherapy is scheduled for 03/22/2020.  Patient had significant nosebleed on 03/17/2020.  He received 2 units PRBCs on 03/18/2020 due to hemoglobin 6.8.  Lovenox was stopped.  Patient spiked fevers with T-max 102 this afternoon. A CT chest/abdmen/pelvis was ordered but not done yet. His labs showed Na 114, CL 86, CO2 19, BUN 47,  Cr 1.06, WBC 4.6 (was 2.9 on 9/10), hemoglobin 10, PLT 46 which is at his baseline, nonrevealing UA.  Covid is pending.  CXR showed Areas of patchy atelectasis bilaterally. KUB showed stool burden.  Patient was empirically started on vancomycin and Zosyn at inpatient rehab.  Patient is admitted to [facility] now.  Hospital Course:  54 year old male with very long/complex hospitalization at [facility] long hospital recently in 02/2020 with back pain and was found to have diffuse lytic lesions throughout the skeleton, epidural tumor extension with pathological fracture of T6 and T7, contrast-enhancing mass replacing most of T5 vertebral body with severe cord compression, and acute renal failure, required couple of hemodialysis sessions, kidney function improved. --Followed by medical and radiation oncology, treated with IV Decadron and has completed 10/10 doses of XRT. Work-up consistent with newly diagnosed IgG kappa MM with plans for CyBorD chemo to start following completion of XRT.  -Following transfer to rehab, he underwent chemotherapy on 03/15/2020 and next chemotherapy was scheduled for 03/22/2020.Patient had significant nosebleed on 03/17/2020. -Subsequently spiked fevers with T-max 102 -blood culture grew MSSA bacteremia, started on IV Ancef seen by ID as well for this -During this hospitalization; he was also noted to have a 5 x 2 cm left upper lobechest wallmass on CT. -On 03/21/2020: He was found to be lethargic/less responsive. Seen by neurology: Suspicious for seizure activity. Started on Keppra. EEG negative. MRI noted to have acute intracranial hemorrhage in the right frontal convexity, followed by neurology for this. Mental status slowly improving. -Patient has been complaining of chest pain throughout at the site of his chest wall mass. CTA negative for PE [facility] course complicated by ileus and pleural effusion from third spacing, now improving -03/28/2020: Received platelet  transfusion.  03/29/2020: CT-guided biopsy of left anterior chest wall mass  9/29/ 21-9/30/21 after extensive family meeting along with the palliative care team family is agreed to recommendation of comfort care measures. The patient succumbed to his disease on 05/07/20 at 00:20.  Today's assessment: For last physical exam prior to death, please see physical exam in progress note dated 04/09/2020. Vitals:  Vitals:   04/09/20 0100 04/09/20 1650  BP:  109/73  Pulse:  (!) 134  Resp:  (!) 40  Temp: 99.6 F (37.6 C) 98.2 F (36.8 C)  SpO2:      Allergies  Allergen Reactions  . Other     pollen  . Penicillins     unknown    The results of significant diagnostics from this hospitalization (including imaging, microbiology, ancillary and laboratory) are listed below for reference.    Significant Diagnostic Studies: EEG  Result Date: 03/22/2020 Lora Havens, MD     03/22/2020  1:52 PM Patient Name: Trevor Shelton MRN: [id number] Epilepsy Attending: Lora Havens Referring Physician/Provider: Dr Karena Addison Aroor Date: 03/22/2020 Duration: 23.25 mins Patient history: 54yo m with AMS. CTH showed right frontal mass concerning for mets. EEG to evaluate for seizure. Level of alertness: awake AEDs during EEG study: LEV Technical aspects: This EEG study was done with scalp electrodes positioned according to the 10-20 International system of electrode placement. Electrical activity was acquired at a sampling rate of '500Hz'  and reviewed with a high frequency filter of '70Hz'  and a low frequency filter of '1Hz' . EEG data were recorded continuously and digitally stored. Description: No posterior dominant rhythm was seen. EEG showed continuous generalized 3 to 6 Hz theta-delta slowing. Hyperventilation and photic stimulation were not performed.   ABNORMALITY -Continuous slow, generalized IMPRESSION: This study is suggestive of moderate diffuse encephalopathy, nonspecific etiology. No seizures or epileptiform  discharges were seen throughout the recording. Priyanka Barbra Sarks   CT ABDOMEN PELVIS WO CONTRAST  Result Date: 03/24/2020 CLINICAL DATA:  Abdominal pain, abdominal distension EXAM: CT ABDOMEN AND PELVIS WITHOUT CONTRAST TECHNIQUE: Multidetector CT imaging of the abdomen and pelvis was performed following the standard protocol without IV contrast. Oral enteric contrast was administered. COMPARISON:  CT chest abdomen pelvis, 03/20/2020 FINDINGS: Lower chest: Dependent bibasilar atelectasis and/or consolidation with small bilateral pleural effusions, similar to prior examination. Hepatobiliary: No solid liver abnormality is seen. No gallstones, gallbladder wall thickening, or biliary dilatation. Pancreas: Unremarkable. No pancreatic ductal dilatation or surrounding inflammatory changes. Spleen: Normal in size without significant abnormality. Adrenals/Urinary Tract: Adrenal glands are unremarkable. Kidneys are normal, without renal calculi, solid lesion, or hydronephrosis. Bladder is unremarkable. Stomach/Bowel: Stomach is within normal limits. The small bowel and colon are diffusely gas distended, gas and stool present to the rectum, and degree of bowel distension somewhat increased compared to prior examination. Largest loops of mid small bowel measure up to 4.4 cm in caliber. The cecum measures up to 9.4 cm. Vascular/Lymphatic: No significant vascular findings are present. No enlarged abdominal or pelvic lymph nodes. Reproductive: No mass or other significant abnormality. Other: Small fat and fluid containing bilateral inguinal hernias. Trace ascites throughout the abdomen and pelvis, new compared to prior examination. Musculoskeletal: There is redemonstrated, diffuse lytic osseous metastatic disease, most notable for a large lytic lesion and associated pathologic fracture of the T9 vertebral body. IMPRESSION: 1. The small bowel and colon are diffusely gas distended, gas and stool present to the rectum, and degree  of bowel distension somewhat increased compared to prior examination. Findings are most consistent  with ileus. 2. Trace ascites throughout the abdomen and pelvis, new compared to prior examination. 3. Dependent bibasilar atelectasis and/or consolidation of the included lung bases with small bilateral pleural effusions, similar to prior examination. 4. There is redemonstrated, diffuse lytic osseous metastatic disease, most notable for a large lytic lesion and associated pathologic fracture of the T9 vertebral body. Findings are in keeping with multiple myeloma. Electronically Signed   By: Eddie Candle M.D.   On: 03/24/2020 17:21   DG Chest 2 View  Result Date: 04/07/2020 CLINICAL DATA:  Shortness of breath and fever EXAM: CHEST - 2 VIEW COMPARISON:  February 25, 2020 FINDINGS: There is mild atelectatic change in the left mid lung and bibasilar regions. There is no edema or airspace opacity. Heart is upper normal in size with pulmonary vascularity normal. No adenopathy. No bone lesions. IMPRESSION: Areas of patchy atelectasis bilaterally. No edema or airspace opacity. Cardiac silhouette within normal limits. Electronically Signed   By: Lowella Grip III M.D.   On: 03/21/2020 15:21   DG Abd 1 View  Result Date: 03/13/2020 CLINICAL DATA:  Nausea, vomiting. EXAM: ABDOMEN - 1 VIEW COMPARISON:  March 10, 2020. FINDINGS: The bowel gas pattern is normal. Moderate amount of stool seen in right colon. No radio-opaque calculi or other significant radiographic abnormality are seen. IMPRESSION: Moderate stool burden is noted. No evidence of bowel obstruction or ileus. Electronically Signed   By: Marijo Conception M.D.   On: 03/13/2020 11:31   CT HEAD WO CONTRAST  Result Date: 03/22/2020 CLINICAL DATA:  Headache EXAM: CT HEAD WITHOUT CONTRAST TECHNIQUE: Contiguous axial images were obtained from the base of the skull through the vertex without intravenous contrast. COMPARISON:  Brain MRI 03/20/2020 FINDINGS: Brain:  Trace extra-axial hemorrhage beneath the right frontal lobe remains faintly visible. No new site of hemorrhage. No midline shift or other mass effect. Vascular: No hyperdense vessel or unexpected calcification. Skull: Numerous lucent lesions throughout the calvarium and skull base. The largest lesion is at the left clivus. Sinuses/Orbits: No acute finding. Other: None. IMPRESSION: 1. Trace extra-axial hemorrhage beneath the right frontal lobe remains faintly visible. No new site of hemorrhage. 2. Numerous lucent lesions throughout the calvarium and skull base, consistent with known multiple myeloma. Electronically Signed   By: Ulyses Jarred M.D.   On: 03/22/2020 00:12   CT HEAD WO CONTRAST  Result Date: 03/20/2020 CLINICAL DATA:  Stroke, follow-up. EXAM: CT HEAD WITHOUT CONTRAST TECHNIQUE: Contiguous axial images were obtained from the base of the skull through the vertex without intravenous contrast. COMPARISON:  Brain MRI 03/20/2020. CT head 03/20/2020 FINDINGS: Brain: The examination is mild to moderately motion degraded, limiting evaluation. A previously demonstrated foci of intracranial hemorrhage along the anteroinferior right frontal lobe convexity (suspected both intraparenchymal and extra-axial) has decreased in conspicuity (series 5, image 16, image 17). No new site of acute intracranial hemorrhage is identified. There is no significant mass effect.  No midline shift. Vascular: No hyperdense vessel Skull: Redemonstrated are innumerable marrow replacing lesions throughout the calvarium, skull base and visualized upper cervical spine compatible with the known history of multiple myeloma Sinuses/Orbits: No acute orbital abnormality identified. No significant paranasal sinus disease or mastoid effusion. IMPRESSION: Motion degraded examination. Interval decrease in size and conspicuity of a known focus of intracranial hemorrhage along the anteroinferior right frontal lobe convexity (suspected both  intraparenchymal and extra-axial). No new site of acute intracranial hemorrhage is identified. Redemonstrated innumerable marrow replacing lesions throughout the calvarium, skull base and visualized  upper cervical spine compatible with the known history of multiple myeloma. Electronically Signed   By: Kellie Simmering DO   On: 03/20/2020 13:39   CT HEAD WO CONTRAST  Result Date: 03/20/2020 CLINICAL DATA:  Initial evaluation for acute altered mental status. EXAM: CT HEAD WITHOUT CONTRAST TECHNIQUE: Contiguous axial images were obtained from the base of the skull through the vertex without intravenous contrast. COMPARISON:  None available. FINDINGS: Brain: Cerebral volume within normal limits for age. There is a somewhat focal lobulated hyperdensity immediately subjacent to the right frontal calvarium (series 3, image 18) finding also seen on coronal sequence (series 5, images 21-23) and sagittal sequence (series 6, image 23). Lesion measures up to 2.1 cm in greatest dimension on sagittal sequence. While this area is often prone to artifact, finding is suspicious for a small bleed. Finding is favored to be intra-axial in nature, although a small extra-axial component may be present as well. No significant mass effect or edema. No other acute intracranial hemorrhage. No acute large vessel territory infarct. No mass lesion, midline shift, or mass effect. No hydrocephalus. Vascular: No hyperdense vessel. Skull: Scalp soft tissues demonstrate no acute finding. Multiple scattered lucent lesion seen throughout the calvarium and visualized upper cervical spine. Note made of a prominent 1.7 cm lytic lesion within the clivus. Findings are nonspecific, but concerning for possible Mets or myeloma. Sinuses/Orbits: Globes and orbital soft tissues within normal limits. Paranasal sinuses are clear. Trace left mastoid effusion noted. Other: None. IMPRESSION: 1. Small lobulated hyperdensity immediately subjacent to the right frontal  calvarium as above. Highly suspicious for a small bleed. No significant mass effect or edema. 2. Multiple scattered lucent lesions throughout the calvarium and visualized upper cervical spine, consistent with history of multiple myeloma. 3. No other acute intracranial abnormality. Critical Value/emergent results were called by telephone at the time of interpretation on 03/20/2020 at 1:12 am to provider NA LI , who verbally acknowledged these results. Electronically Signed   By: Jeannine Boga M.D.   On: 03/20/2020 01:20   CT ANGIO CHEST PE W OR WO CONTRAST  Result Date: 03/27/2020 CLINICAL DATA:  Chest pain and difficulty breathing. Possible pulmonary embolism. EXAM: CT ANGIOGRAPHY CHEST WITH CONTRAST TECHNIQUE: Multidetector CT imaging of the chest was performed using the standard protocol during bolus administration of intravenous contrast. Multiplanar CT image reconstructions and MIPs were obtained to evaluate the vascular anatomy. CONTRAST:  79m OMNIPAQUE IOHEXOL 350 MG/ML SOLN COMPARISON:  Chest CT 03/20/2020 FINDINGS: Cardiovascular: Mild stable cardiomegaly. Minimal calcified plaque over the left anterior descending and right coronary arteries. Thoracic aorta is normal in caliber. Pulmonary arterial system is well opacified without evidence of emboli. Mediastinum/Nodes: No evidence of mediastinal or hilar adenopathy. Interval development of left axillary adenopathy. Lungs/Pleura: Lungs are adequately inflated and demonstrate slight worsening small bilateral pleural effusions with associated posterior bibasilar atelectasis. Interval worsening of mild patchy bilateral airspace opacification most prominent over the periphery of the right upper lung. Findings may be due to multifocal infection. Airways are unremarkable. Interval enlargement of pleural based mass over the anterolateral left upper thorax measuring 2.3 x 5.6 cm (previously 1.4 x 5.1 cm). Upper Abdomen: No acute findings. Musculoskeletal:  Interval progression of diffuse lytic process throughout the axial and appendicular bony structures compatible with known multiple myeloma. Multiple bilateral rib fractures unchanged. Fracture of the body of the sternum just below the sternomanubrial joint unchanged. Fracture with mild associated soft tissue density/lytic process over the inferior aspect of the right scapula with interval  progression. Known pathologic compression fractures T6 and T9 without significant change. All these fracture are likely pathologic secondary to patient's underlying multiple myeloma. Review of the MIP images confirms the above findings. IMPRESSION: 1. No evidence of pulmonary embolism. 2. Interval worsening of mild patchy bilateral airspace process most prominent over the periphery of the right upper lung which may be due to multifocal infection. Slight worsening small bilateral pleural effusions with associated bibasilar atelectasis. 3. Interval enlargement of a pleural based mass over the anterolateral left upper thorax measuring 2.3 x 5.6 cm (previously 1.4 x 5.1 cm). Interval development of left axillary adenopathy. Findings are likely due to patient's underlying multiple myeloma. 4. Stable cardiomegaly and minimal atherosclerotic coronary artery disease. 5. Diffuse lytic process throughout the axial and appendicular bony structures compatible with patient's known multiple myeloma. Multiple associated spinal compression fractures, rib fractures, sternal fracture and right inferior scapular fracture likely pathologic due to underlying myeloma. Electronically Signed   By: Marin Olp M.D.   On: 03/27/2020 16:06   MR BRAIN W WO CONTRAST  Result Date: 03/20/2020 CLINICAL DATA:  Initial evaluation for brain mass or lesion. EXAM: MRI HEAD WITHOUT AND WITH CONTRAST TECHNIQUE: Multiplanar, multiecho pulse sequences of the brain and surrounding structures were obtained without and with intravenous contrast. CONTRAST:  55m GADAVIST  GADOBUTROL 1 MMOL/ML IV SOLN COMPARISON:  Prior head CT from earlier the same day. FINDINGS: Brain: Cerebral volume within normal limits for age. No significant cerebral white matter disease or other focal parenchymal signal abnormality. Abnormal T1/FLAIR hyperintensity involving the anterior inferior right frontal convexity, consistent with acute intracranial hemorrhage, corresponding with abnormality on prior CT (series 11, images 10-14). This appears to be both intraparenchymal and extra-axial, and is favored to have initially represented a small parenchymal bleed that developed subdural/extra-axial extension. Size of this hemorrhage measures approximately 1.8 cm in greatest dimension on sagittal sequences. No associated edema or significant regional mass effect. No definite underlying lesion. Slight asymmetric smooth dural thickening and enhancement seen elsewhere about the right cerebral convexity, favored to be reactive. Consideration given to possible myelomatous involvement of the dura, although this would be expected to be more nodular and irregular in appearance. No other acute intracranial hemorrhage identified. Single punctate focus of susceptibility artifact noted at the right frontal centrum semi ovale, likely a small chronic microhemorrhage, of doubtful significance in isolation. No evidence for acute or subacute infarct. No encephalomalacia to suggest chronic cortical infarction. No appreciable intra-axial mass lesion. Ventricles normal size without hydrocephalus. No other extra-axial fluid collection. 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 Midline structures intact. No other abnormal enhancement. Vascular: Major intracranial vascular flow voids are maintained. Skull and upper cervical spine: Craniocervical junction within normal limits. Extensive and innumerable marrow replacing lesions seen throughout the calvarium, skull base, and visualized upper spine, compatible with history of multiple myeloma.  Most prominent of these lesions positioned in the clivus and measures approximately 2 cm in size. No other scalp soft tissue abnormality. Sinuses/Orbits: Globes and orbital soft tissues within normal limits. Paranasal sinuses are largely clear. No significant mastoid effusion. Inner ear structures grossly normal. Other: None. IMPRESSION: 1. Acute intracranial hemorrhage involving the anterior-inferior right frontal convexity, stable from prior head CT. No associated edema or significant mass effect. 2. No other acute intracranial abnormality. 3. Innumerable marrow replacing lesions throughout the calvarium, skull base, and visualized upper spine, compatible with history of multiple myeloma. Electronically Signed   By: BJeannine BogaM.D.   On: 03/20/2020 07:35  CT CHEST ABDOMEN PELVIS W CONTRAST  Result Date: 03/20/2020 CLINICAL DATA:  Abdominal distension. EXAM: CT CHEST, ABDOMEN, AND PELVIS WITH CONTRAST TECHNIQUE: Multidetector CT imaging of the chest, abdomen and pelvis was performed following the standard protocol during bolus administration of intravenous contrast. CONTRAST:  137m OMNIPAQUE IOHEXOL 300 MG/ML  SOLN COMPARISON:  February 14, 2020 FINDINGS: CT CHEST FINDINGS Cardiovascular: No significant vascular findings. There is mild cardiomegaly. No pericardial effusion. Mediastinum/Nodes: No enlarged mediastinal, hilar, or axillary lymph nodes. Thyroid gland, trachea, and esophagus demonstrate no significant findings. Lungs/Pleura: Mild to moderate severity areas of atelectasis and/or infiltrate are seen within the inferior aspect of the left upper lobe posterior aspect of the superior portion of the right lower lobe and posterior aspect of the bilateral lung bases. There is a very small left pleural effusion. A 5.2 cm x 1.5 cm pleural based soft tissue mass is seen along the anterior aspect of the left upper lobe (axial CT image 22, CT series number 3). A 1.4 cm pleural based soft tissue nodule is  seen within this region on the prior study. Musculoskeletal: Chronic anterior second, third and fourth left rib fractures are noted. A chronic fracture of the right scapula is also seen. Numerous lytic lesions are seen throughout the thoracic spine with a chronic pathologic fractures noted at the levels of T6 and T9 vertebral bodies. These are seen on the prior study. CT ABDOMEN PELVIS FINDINGS Hepatobiliary: A 9 mm cystic appearing area is seen within the medial aspect of the right lobe of the liver. No gallstones, gallbladder wall thickening, or biliary dilatation. Pancreas: Unremarkable. No pancreatic ductal dilatation or surrounding inflammatory changes. Spleen: Normal in size without focal abnormality. Adrenals/Urinary Tract: Adrenal glands are unremarkable. Kidneys are normal in size, without renal or hydronephrosis. A 1.3 cm simple cyst is seen within the anteromedial aspect of the upper left kidney. Bladder is unremarkable. Stomach/Bowel: There is a small hiatal hernia. Appendix appears normal. No evidence of bowel wall thickening, distention, or inflammatory changes. A moderate amount of stool is seen within the distal sigmoid colon and rectum. Vascular/Lymphatic: No significant vascular findings are present. No enlarged abdominal or pelvic lymph nodes. Reproductive: The prostate gland is markedly enlarged. Other: A 4.0 cm x 3.2 cm fat-containing right scrotal hernia is noted. No abdominopelvic ascites. Musculoskeletal: Numerous lytic lesions are seen throughout the lumbar spine and pelvis. IMPRESSION: 1. Mild to moderate severity bilateral areas of atelectasis and/or infiltrate. 2. 5.2 cm x 1.5 cm pleural based soft tissue mass along the anterior aspect of the left upper lobe. A 1.4 cm pleural based soft tissue nodule is seen within this region on the prior study. These findings are concerning for the presence of a primary lung malignancy. Further evaluation with a nuclear medicine PET/CT is recommended.  3. Findings consistent with multiple myeloma versus diffuse osseous metastasis, with chronic pathologic fractures at the levels of T6 and T9 vertebral bodies. 4. Markedly enlarged prostate gland. 5. 4.0 cm x 3.2 cm fat-containing right scrotal hernia. 6. Mild cardiomegaly. 7. Small hiatal hernia. 8. Aortic atherosclerosis. 9. Small simple cyst within the left kidney. Aortic Atherosclerosis (ICD10-I70.0). Electronically Signed   By: TVirgina NorfolkM.D.   On: 03/20/2020 01:09   CT BIOPSY  Result Date: 03/29/2020 INDICATION: 54year old male with history of multiple myeloma and left anterior chest wall mass. EXAM: CT BIOPSY COMPARISON:  Chest CT from 03/27/2020 MEDICATIONS: None. ANESTHESIA/SEDATION: Fentanyl 25 mcg IV; Versed 0.5 mg IV Sedation time: 18 minutes; The patient  was continuously monitored during the procedure by the interventional radiology nurse under my direct supervision. CONTRAST:  None. COMPLICATIONS: None immediate. PROCEDURE: Informed consent was obtained from the patient following an explanation of the procedure, risks, benefits and alternatives. A time out was performed prior to the initiation of the procedure. The patient was positioned supine on the CT table and a limited CT was performed for procedural planning demonstrating similar appearing left anterior chest wall soft tissue mass invading the anterior second and third ribs measuring approximately 5.0 x 3.1 cm in axial dimension. The procedure was planned. The operative site was prepped and draped in the usual sterile fashion. Appropriate trajectory was confirmed with a 22 gauge spinal needle after the adjacent tissues were anesthetized with 1% Lidocaine with epinephrine. Under intermittent CT guidance, a 17 gauge coaxial needle was advanced into the peripheral aspect of the mass. Appropriate positioning was confirmed and a total of 5 samples were obtained with an 18 gauge core needle biopsy device. The co-axial needle was removed  while injecting Gel-Foam slurry and hemostasis was achieved with manual compression. A limited postprocedural CT was negative for hemorrhage or additional complication. A dressing was placed. The patient tolerated the procedure well without immediate postprocedural complication. IMPRESSION: Technically successful CT guided core needle biopsy of left anterior chest wall mass. Electronically Signed   By: Ruthann Cancer MD   On: 03/29/2020 10:33   DG CHEST PORT 1 VIEW  Result Date: 03/30/2020 CLINICAL DATA:  Dyspnea EXAM: PORTABLE CHEST 1 VIEW COMPARISON:  03/28/2020 FINDINGS: Cardiac shadow is within normal limits. Lungs are well aerated bilaterally. Patchy airspace opacity is again identified throughout both lungs similar to that seen on the prior study. Stable lytic lesions are noted within the bony skeleton. IMPRESSION: Stable patchy airspace opacities bilaterally. Electronically Signed   By: Inez Catalina M.D.   On: 03/30/2020 21:38   DG CHEST PORT 1 VIEW  Result Date: 03/28/2020 CLINICAL DATA:  Tachypnea EXAM: PORTABLE CHEST 1 VIEW COMPARISON:  03/27/2020 CT FINDINGS: Cardiac shadow is stable. Diffuse bilateral infiltrates are again seen similar to that noted on prior chest CT. No sizable effusion is noted. No pneumothorax is noted. Lytic lesions are noted throughout the bony skeleton particularly in the midthoracic spine similar to that seen on recent CT examination. IMPRESSION: Patchy bilateral infiltrate stable from the prior exam. Stable lytic lesions within the bony structures. Electronically Signed   By: Inez Catalina M.D.   On: 03/28/2020 23:04   DG Chest Port 1 View  Result Date: 03/23/2020 CLINICAL DATA:  Difficulty breathing EXAM: PORTABLE CHEST 1 VIEW COMPARISON:  03/23/2019 FINDINGS: Cardiac shadow is mildly prominent but stable. Patchy airspace opacity is again noted bilaterally but improved when compared with the prior exam. No sizable effusion or pneumothorax is noted. No bony abnormality  is seen. IMPRESSION: Improving aeration bilaterally. Electronically Signed   By: Inez Catalina M.D.   On: 03/23/2020 07:37   DG CHEST PORT 1 VIEW  Result Date: 03/22/2020 CLINICAL DATA:  Rapid response EXAM: PORTABLE CHEST 1 VIEW COMPARISON:  03/21/2020, 03/22/2020, 02/25/2020 FINDINGS: Mildly diminished lung volumes. Mild cardiomegaly. Left greater than right airspace disease. No pneumothorax. IMPRESSION: Patchy left greater than right airspace disease concerning for pneumonia. Findings appear slightly worsened. Electronically Signed   By: Donavan Foil M.D.   On: 03/22/2020 00:47   DG CHEST PORT 1 VIEW  Result Date: 03/21/2020 CLINICAL DATA:  Shortness of breath EXAM: PORTABLE CHEST 1 VIEW COMPARISON:  March 19, 2020 chest  radiograph and chest CT March 20, 2020 FINDINGS: There is persistent atelectatic change in left mid lung and left base regions. There is also atelectatic change in the right mid lung and right base regions, new. There may be superimposed pneumonia in the left mid lung. There is a small left pleural effusion. Heart is upper normal in size with pulmonary vascularity normal. No adenopathy. Old healed rib trauma on the left, better seen by CT. IMPRESSION: Areas of atelectatic change bilaterally, overall increased bilaterally. Question superimposed pneumonia left mid lung. Small left pleural effusion. Stable cardiac silhouette. Electronically Signed   By: Lowella Grip III M.D.   On: 03/21/2020 08:09   DG ABD ACUTE 2+V W 1V CHEST  Result Date: 04/05/2020 CLINICAL DATA:  Abdominal pain EXAM: X-RAY ABDOMEN 3 VIEW COMPARISON:  Chest 03/30/2020.  Abdomen 03/24/2020 FINDINGS: Normal heart size. Patchy linear and airspace infiltrates in both mid lungs and in the right lower lung likely representing multifocal pneumonia. No significant progression since previous study. No pleural effusions. No pneumothorax. Mediastinal contours appear intact. Old and acute rib fractures. Multiple  small focal lucencies within the visualized bones suggesting multiple myeloma. Moderately dilated gas-filled small and large bowel with large amount of stool in the rectosigmoid region. Changes likely represent adynamic ileus although obstipation may also be present. No abnormal air-fluid levels. No free air in the abdomen. No radiopaque stones. Degenerative changes in the spine. IMPRESSION: 1. Patchy linear and airspace infiltrates in both mid lungs and in the right lower lung likely representing multifocal pneumonia. 2. Multiple rib fractures. Scattered bone lucencies suggest multiple myeloma. 3. Moderately dilated gas-filled small and large bowel with large amount of stool in the rectosigmoid region. Changes likely represent adynamic ileus although obstipation may also be present. Electronically Signed   By: Lucienne Capers M.D.   On: 04/05/2020 23:17   DG Abd Portable 1V  Result Date: 03/23/2020 CLINICAL DATA:  Abdominal distention, ileus EXAM: PORTABLE ABDOMEN - 1 VIEW COMPARISON:  03/23/2020 FINDINGS: Diffuse gaseous distention of bowel, stable or worsening since prior study. No visible free air organomegaly. IMPRESSION: Stable or slight worsening diffuse gaseous distention of bowel compatible with ileus. Electronically Signed   By: Rolm Baptise M.D.   On: 03/16/2020 05:54   DG Abd Portable 1V  Result Date: 03/09/2020 CLINICAL DATA:  Fever EXAM: X-RAY ABDOMEN 1 VIEW COMPARISON:  03/13/2020 FINDINGS: The bowel gas pattern is nonspecific with gaseous distention of loops of small bowel and colon scattered throughout the abdomen. There is an above average amount of stool throughout the ascending colon. There is no definite pneumatosis or free air. No radiopaque kidney stones. IMPRESSION: 1. Nonspecific bowel gas pattern with gaseous distention of loops of small bowel and colon scattered throughout the abdomen. 2. Above average amount of stool throughout the ascending colon. This is not significantly  changed from 03/13/2020. Electronically Signed   By: Constance Holster M.D.   On: 04/07/2020 17:46   EEG adult  Result Date: 03/24/2020 Lora Havens, MD     03/24/2020 11:15 AM Patient Name: Trevor Shelton MRN: [id number] Epilepsy Attending: Lora Havens Referring Physician/Provider: Dr Roland Rack Date: 03/24/2020 Duration: 22.49 mins Patient history: 54yo M with seizure like episode. EEG to evaluate for seizure. Level of alertness: Awake AEDs during EEG study: LEV Technical aspects: This EEG study was done with scalp electrodes positioned according to the 10-20 International system of electrode placement. Electrical activity was acquired at a sampling rate of '500Hz'  and reviewed with a  high frequency filter of '70Hz'  and a low frequency filter of '1Hz' . EEG data were recorded continuously and digitally stored. Description: No posterior dominant rhythm was seen. EEG showed continuous generalized 3 to 6 Hz theta-delta slowing. Significant eye flutter artifact was also noted. Hyperventilation and photic stimulation were not performed.   ABNORMALITY -Continuous slow, generalized IMPRESSION: This study is suggestive of moderate diffuse encephalopathy, nonspecific etiology. No seizures or epileptiform discharges were seen throughout the recording. Lora Havens   ECHOCARDIOGRAM COMPLETE  Result Date: 03/21/2020    ECHOCARDIOGRAM REPORT   Patient Name:   Trevor Shelton Date of Exam: 03/21/2020 Medical Rec #:  [id number]   Height:       63.0 in Accession #:    [id number]  Weight:       126.5 lb Date of Birth:  October 03, 1965    BSA:          1.592 m Patient Age:    34 years    BP:           133/99 mmHg Patient Gender: M           HR:           112 bpm. Exam Location:  Inpatient Procedure: 2D Echo, Color Doppler and Cardiac Doppler Indications:    Bacteremia  History:        Patient has no prior history of Echocardiogram examinations.                 Risk Factors:Hypertension, Diabetes and Dyslipidemia.   Sonographer:    Raquel Sarna Senior RDCS Referring Phys: [id number] [facility]  Sonographer Comments: Poor apical window due to restricted mobility. IMPRESSIONS  1. Left ventricular ejection fraction, by estimation, is 70 to 75%. The left ventricle has hyperdynamic function. The left ventricle has no regional wall motion abnormalities. There is mild left ventricular hypertrophy. Left ventricular diastolic parameters are consistent with Grade I diastolic dysfunction (impaired relaxation).  2. Right ventricular systolic function is normal. The right ventricular size is normal.  3. Left atrial size was moderately dilated.  4. The mitral valve is abnormal. Trivial mitral valve regurgitation.  5. The aortic valve is tricuspid. Aortic valve regurgitation is not visualized.  6. The inferior vena cava is normal in size with greater than 50% respiratory variability, suggesting right atrial pressure of 3 mmHg. Conclusion(s)/Recommendation(s): No evidence of valvular vegetations on this transthoracic echocardiogram. Would recommend a transesophageal echocardiogram to exclude infective endocarditis if clinically indicated. FINDINGS  Left Ventricle: Left ventricular ejection fraction, by estimation, is 70 to 75%. The left ventricle has hyperdynamic function. The left ventricle has no regional wall motion abnormalities. The left ventricular internal cavity size was normal in size. There is mild left ventricular hypertrophy. Left ventricular diastolic parameters are consistent with Grade I diastolic dysfunction (impaired relaxation). Indeterminate filling pressures. Right Ventricle: The right ventricular size is normal. No increase in right ventricular wall thickness. Right ventricular systolic function is normal. Left Atrium: Left atrial size was moderately dilated. Right Atrium: Right atrial size was normal in size. Pericardium: There is no evidence of pericardial effusion. Mitral Valve: The mitral valve is abnormal. There is mild  thickening of the mitral valve leaflet(s). Trivial mitral valve regurgitation. Tricuspid Valve: The tricuspid valve is grossly normal. Tricuspid valve regurgitation is not demonstrated. Aortic Valve: The aortic valve is tricuspid. Aortic valve regurgitation is not visualized. Pulmonic Valve: The pulmonic valve was grossly normal. Pulmonic valve regurgitation is not visualized. Aorta: The aortic root and ascending aorta  are structurally normal, with no evidence of dilitation. Venous: The inferior vena cava is normal in size with greater than 50% respiratory variability, suggesting right atrial pressure of 3 mmHg. IAS/Shunts: No atrial level shunt detected by color flow Doppler.  LEFT VENTRICLE PLAX 2D LVIDd:         4.30 cm LVIDs:         2.40 cm LV PW:         1.10 cm LV IVS:        0.90 cm LVOT diam:     2.00 cm LV SV:         78 LV SV Index:   49 LVOT Area:     3.14 cm  RIGHT VENTRICLE RV S prime:     19.80 cm/s TAPSE (M-mode): 2.4 cm LEFT ATRIUM             Index       RIGHT ATRIUM           Index LA diam:        3.70 cm 2.32 cm/m  RA Area:     19.10 cm LA Vol (A2C):   46.0 ml 28.90 ml/m RA Volume:   53.30 ml  33.48 ml/m LA Vol (A4C):   82.5 ml 51.83 ml/m LA Biplane Vol: 63.7 ml 40.02 ml/m  AORTIC VALVE LVOT Vmax:   126.00 cm/s LVOT Vmean:  96.400 cm/s LVOT VTI:    0.248 m  AORTA Ao Root diam: 3.20 cm Ao Asc diam:  2.80 cm  SHUNTS Systemic VTI:  0.25 m Systemic Diam: 2.00 cm Lyman Bishop MD Electronically signed by Lyman Bishop MD Signature Date/Time: 03/21/2020/12:51:13 PM    Final     Microbiology: No results found for this or any previous visit (from the past 240 hour(s)).   Labs: Basic Metabolic Panel: Recent Labs  Lab 04/04/20 0233 04/05/20 0230 04/06/20 0547  NA 127* 127*  --   K 4.2 4.1  --   CL 93* 95*  --   CO2 26 23  --   GLUCOSE 224* 202*  --   BUN 32* 28*  --   CREATININE 0.60* 0.61  --   CALCIUM 9.7 9.6  --   MG 1.9 1.7 1.7   Liver Function Tests: No results for  input(s): AST, ALT, ALKPHOS, BILITOT, PROT, ALBUMIN in the last 168 hours. No results for input(s): LIPASE, AMYLASE in the last 168 hours. No results for input(s): AMMONIA in the last 168 hours. CBC: Recent Labs  Lab 04/04/20 0233 04/04/20 2130 04/05/20 0230 04/06/20 0547  WBC 4.5 6.3 5.6 7.1  NEUTROABS 3.4  --  4.1 5.3  HGB 6.9* 8.2* 8.0* 8.8*  HCT 19.9* 24.5* 23.7* 26.3*  MCV 87.3 87.8 87.8 88.9  PLT 59* 52* 46* 33*   Cardiac Enzymes: No results for input(s): CKTOTAL, CKMB, CKMBINDEX, TROPONINI in the last 168 hours. BNP: BNP (last 3 results) Recent Labs    03/29/20 0040  BNP 89.8    ProBNP (last 3 results) No results for input(s): PROBNP in the last 8760 hours.  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  Signed:        Nashton Belson, DO [facility]  2020-04-24, 8:33 AM

## 2020-05-09 NOTE — Progress Notes (Addendum)
Pt expired 10/3 at 00:20. Verified by Glenna Durand, RN and Yetta Glassman, RN. Family at bedside. On call provider Harrold Donath, MD with [facility].   Per conversation with Dr. Tonie Griffith, the certificate of death will be filled out electronically.   Remaining 75 mL of 100 mg morphine in 100 mL NS infusion premix wasted in stericycle with Yetta Glassman, RN

## 2020-05-09 DEATH — deceased

## 2021-07-16 IMAGING — CT CT BIOPSY
1 of 3 series · 14 of 32 positions shown, 19 images · non-contrast
Comparison: Chest CT from 03/27/2020

INDICATION: 54-year-old male with history of multiple myeloma and left anterior
chest wall mass.

EXAM:
CT BIOPSY

[Series 2: i-spiral 5.0 b40f · axial · 0.72mm/px · z∈[+1220,+1440]mm · 14 of 71 slices shown, 19 images]
[im 4/71  soft-tissue]
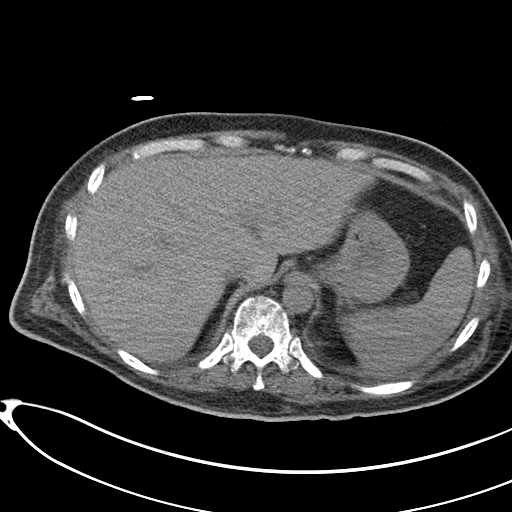
[im 4/71  bone]
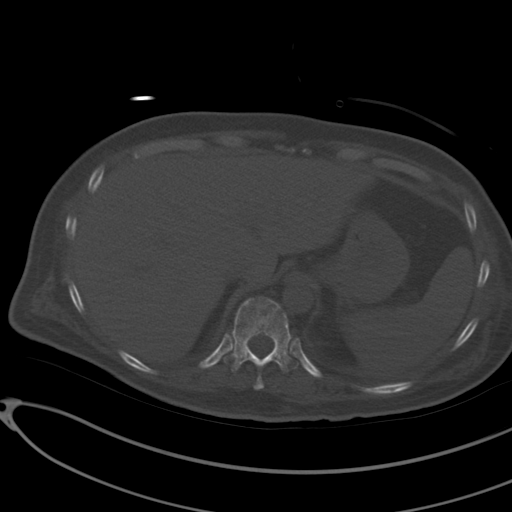
[im 12/71  soft-tissue]
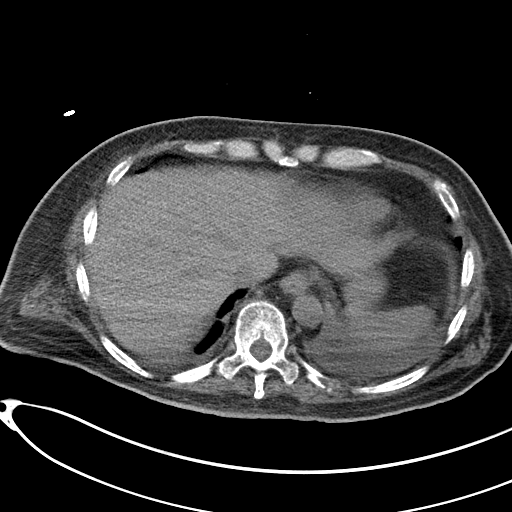
[im 15/71  soft-tissue]
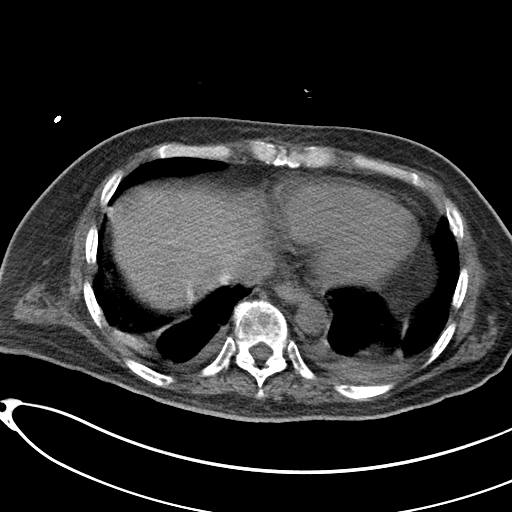
[im 19/71  soft-tissue]
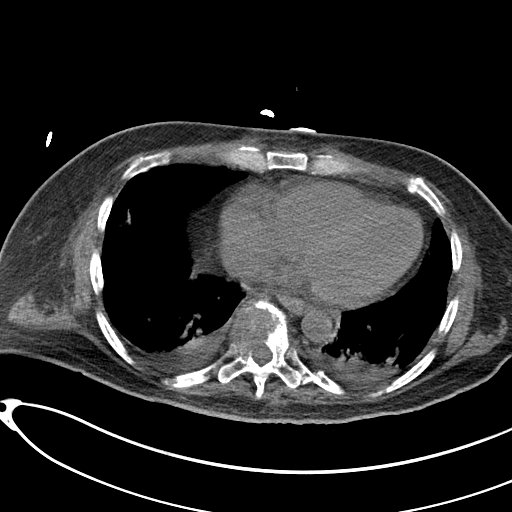
[im 26/71  soft-tissue]
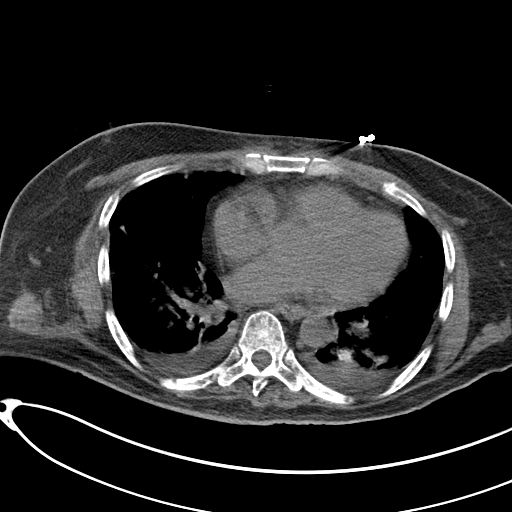
[im 30/71  soft-tissue]
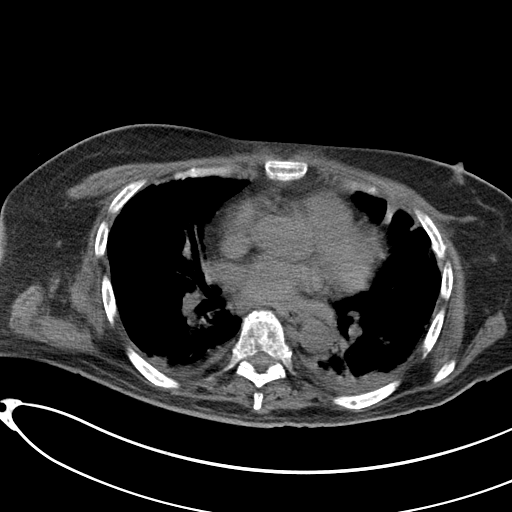
[im 37/71  soft-tissue]
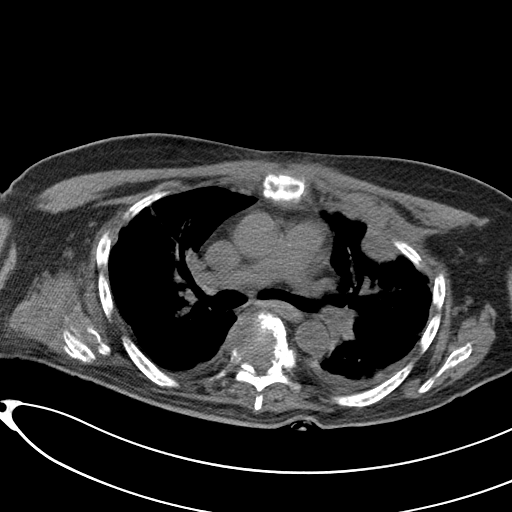
[im 41/71  soft-tissue]
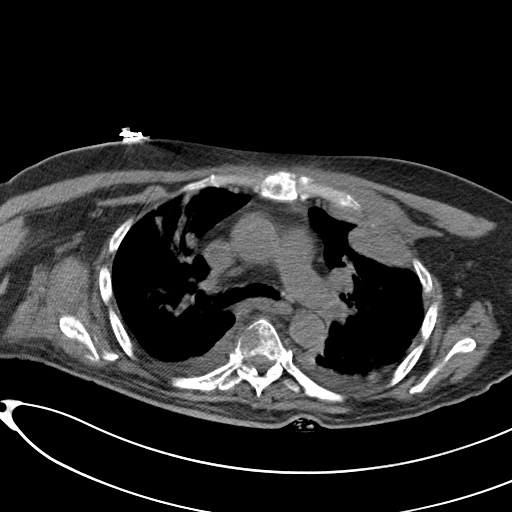
[im 45/71  soft-tissue]
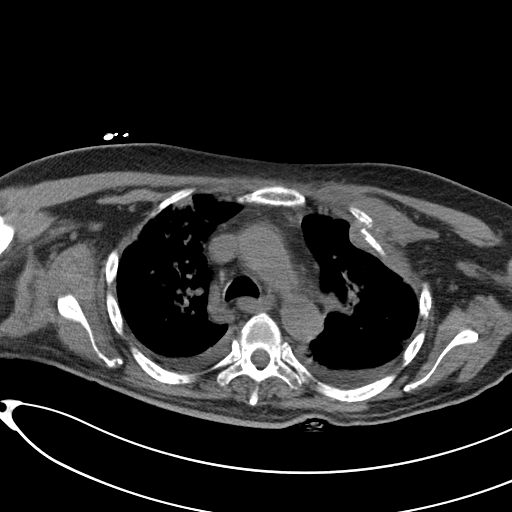
[im 45/71  bone]
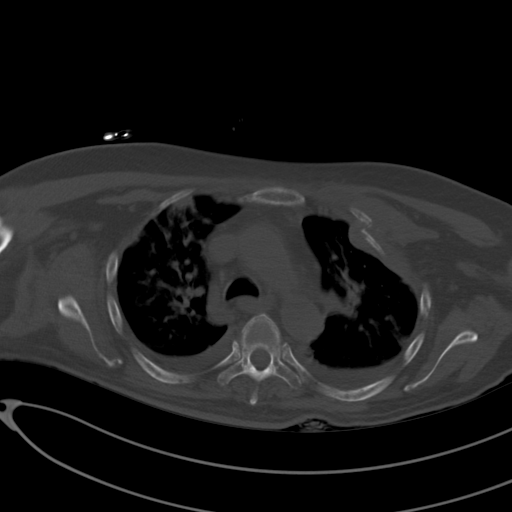
[im 52/71  soft-tissue]
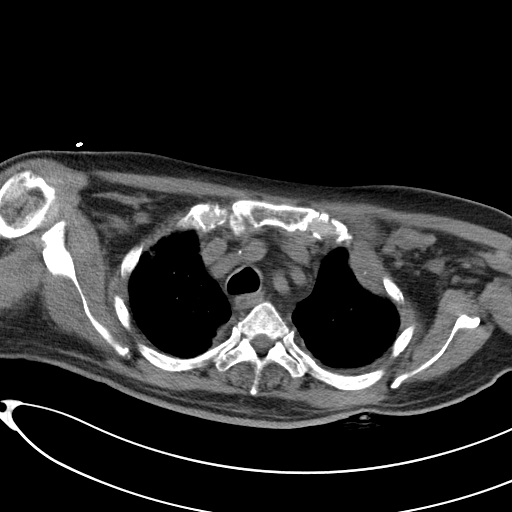
[im 56/71  soft-tissue]
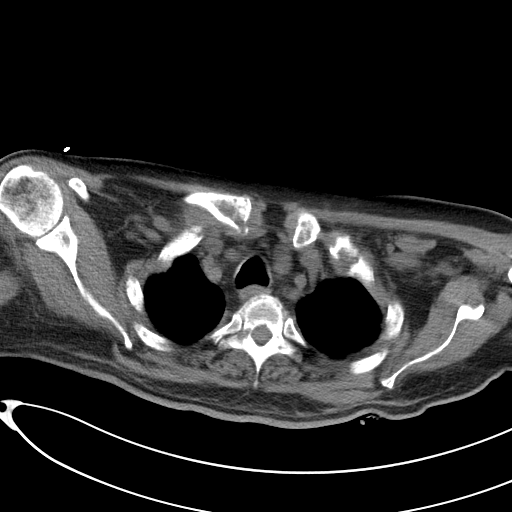
[im 56/71  lung]
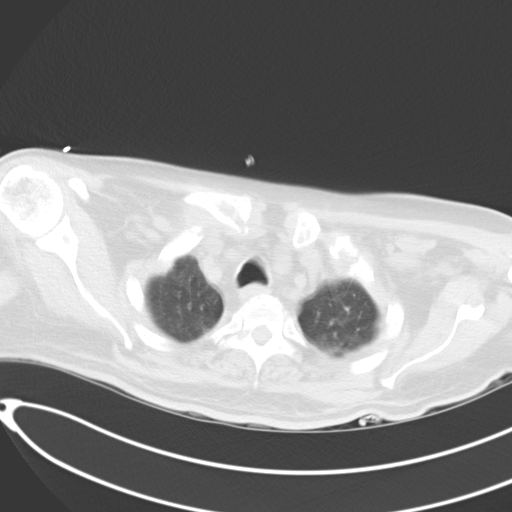
[im 59/71  soft-tissue]
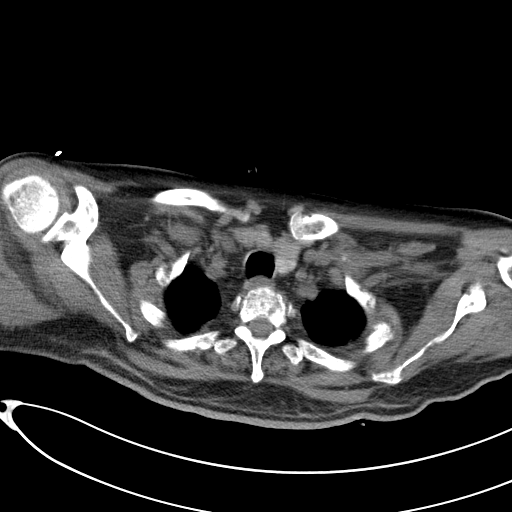
[im 59/71  lung]
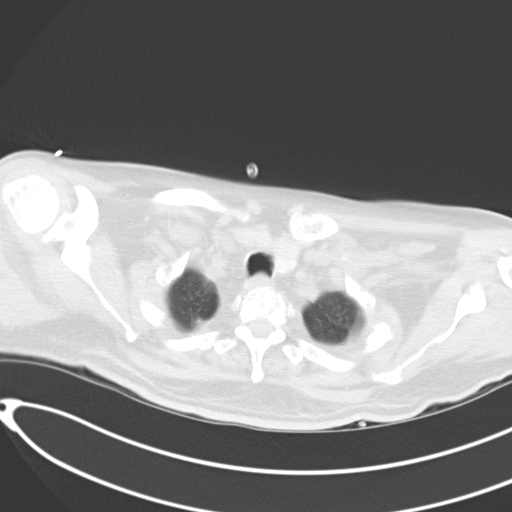
[im 63/71  lung]
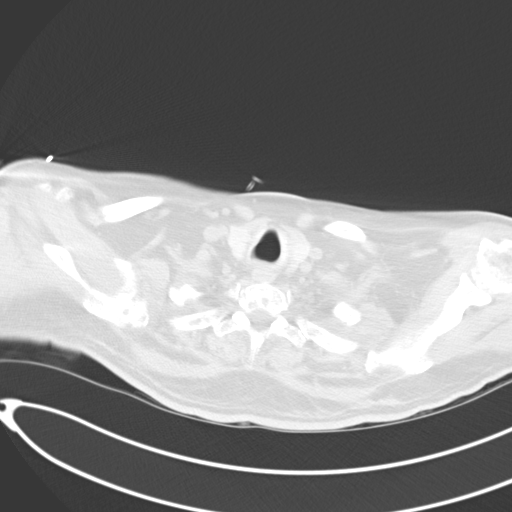
[im 67/71  soft-tissue]
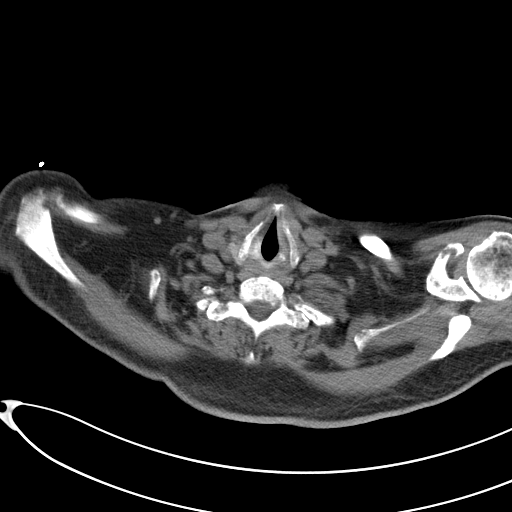
[im 67/71  lung]
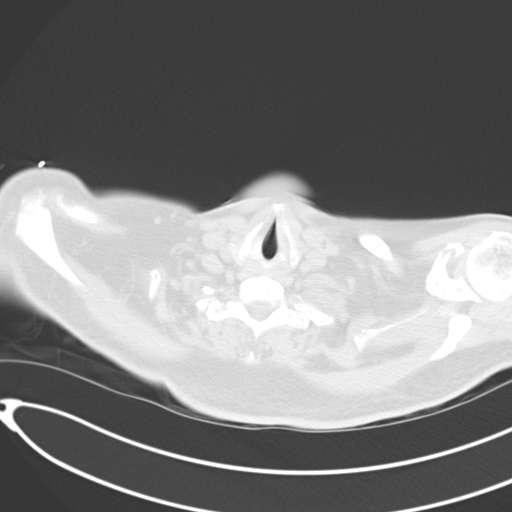

[14 of 32 positions shown; findings below may reference images not displayed]

MEDICATIONS:
None.

ANESTHESIA/SEDATION:
Fentanyl 25 mcg IV; Versed 0.5 mg IV

Sedation time: 18 minutes; The patient was continuously monitored
during the procedure by the interventional radiology nurse under my
direct supervision.

CONTRAST:  None.

COMPLICATIONS:
None immediate.

PROCEDURE:
Informed consent was obtained from the patient following an
explanation of the procedure, risks, benefits and alternatives. A
time out was performed prior to the initiation of the procedure.

The patient was positioned supine on the CT table and a limited CT
was performed for procedural planning demonstrating similar
appearing left anterior chest wall soft tissue mass invading the
anterior second and third ribs measuring approximately 5.0 x 3.1 cm
in axial dimension. The procedure was planned. The operative site
was prepped and draped in the usual sterile fashion. Appropriate
trajectory was confirmed with a 22 gauge spinal needle after the
adjacent tissues were anesthetized with 1% Lidocaine with
epinephrine.

Under intermittent CT guidance, a 17 gauge coaxial needle was
advanced into the peripheral aspect of the mass.

Appropriate positioning was confirmed and a total of 5 samples were
obtained with an 18 gauge core needle biopsy device. The co-axial
needle was removed while injecting Gel-Foam slurry and hemostasis
was achieved with manual compression.

A limited postprocedural CT was negative for hemorrhage or
additional complication. A dressing was placed. The patient
tolerated the procedure well without immediate postprocedural
complication.
IMPRESSION: Technically successful CT guided core needle biopsy of left anterior
chest wall mass.

## 2021-07-17 IMAGING — DX DG CHEST 1V PORT
1 series · 1 of 1 positions shown · non-contrast
Comparison: 03/28/2020

CLINICAL DATA: Dyspnea

EXAM:
PORTABLE CHEST 1 VIEW

[chest]
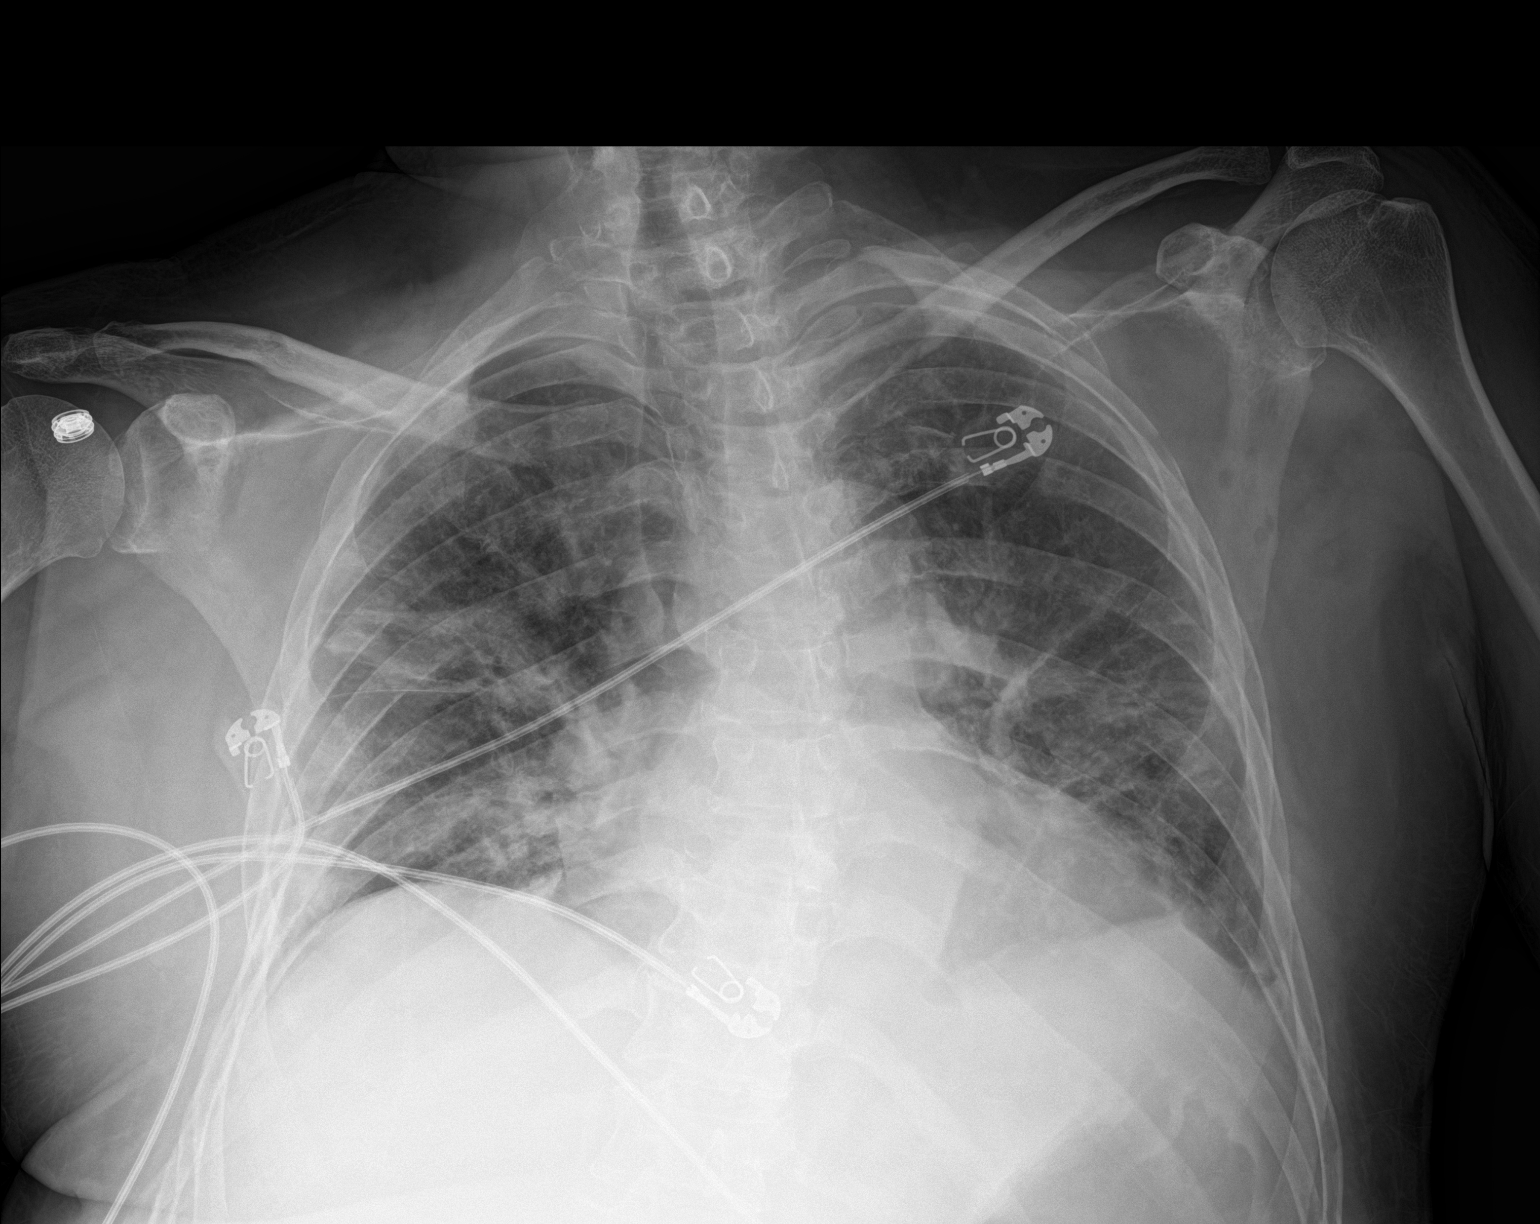

[1 of 1 positions shown; findings below may reference images not displayed]

FINDINGS: Cardiac shadow is within normal limits. Lungs are well aerated
bilaterally. Patchy airspace opacity is again identified throughout
both lungs similar to that seen on the prior study. Stable lytic
lesions are noted within the bony skeleton.
IMPRESSION: Stable patchy airspace opacities bilaterally.
